# Patient Record
Sex: Female | Born: 1995 | Race: Black or African American | Hispanic: No | Marital: Single | Smoking: Former smoker
Health system: Southern US, Community
[De-identification: ages and names within clinical notes are randomized; demographics above are authoritative.]

## PROBLEM LIST (undated history)

## (undated) ENCOUNTER — Inpatient Hospital Stay: Payer: Self-pay

## (undated) ENCOUNTER — Inpatient Hospital Stay (HOSPITAL_COMMUNITY): Payer: Medicaid Other | Admitting: Obstetrics & Gynecology

## (undated) DIAGNOSIS — F32A Depression, unspecified: Secondary | ICD-10-CM

## (undated) DIAGNOSIS — G4700 Insomnia, unspecified: Secondary | ICD-10-CM

## (undated) DIAGNOSIS — M549 Dorsalgia, unspecified: Secondary | ICD-10-CM

## (undated) DIAGNOSIS — F329 Major depressive disorder, single episode, unspecified: Secondary | ICD-10-CM

## (undated) DIAGNOSIS — J189 Pneumonia, unspecified organism: Secondary | ICD-10-CM

## (undated) DIAGNOSIS — U071 COVID-19: Secondary | ICD-10-CM

## (undated) DIAGNOSIS — Z5189 Encounter for other specified aftercare: Secondary | ICD-10-CM

## (undated) DIAGNOSIS — F419 Anxiety disorder, unspecified: Secondary | ICD-10-CM

## (undated) DIAGNOSIS — F319 Bipolar disorder, unspecified: Secondary | ICD-10-CM

## (undated) DIAGNOSIS — I82409 Acute embolism and thrombosis of unspecified deep veins of unspecified lower extremity: Secondary | ICD-10-CM

## (undated) DIAGNOSIS — F4310 Post-traumatic stress disorder, unspecified: Secondary | ICD-10-CM

## (undated) DIAGNOSIS — I10 Essential (primary) hypertension: Secondary | ICD-10-CM

## (undated) DIAGNOSIS — G8929 Other chronic pain: Secondary | ICD-10-CM

## (undated) DIAGNOSIS — T7840XA Allergy, unspecified, initial encounter: Secondary | ICD-10-CM

## (undated) DIAGNOSIS — N920 Excessive and frequent menstruation with regular cycle: Secondary | ICD-10-CM

## (undated) DIAGNOSIS — J45909 Unspecified asthma, uncomplicated: Secondary | ICD-10-CM

## (undated) DIAGNOSIS — F609 Personality disorder, unspecified: Secondary | ICD-10-CM

## (undated) DIAGNOSIS — D649 Anemia, unspecified: Secondary | ICD-10-CM

## (undated) DIAGNOSIS — Z3491 Encounter for supervision of normal pregnancy, unspecified, first trimester: Secondary | ICD-10-CM

## (undated) DIAGNOSIS — D509 Iron deficiency anemia, unspecified: Secondary | ICD-10-CM

## (undated) DIAGNOSIS — O26893 Other specified pregnancy related conditions, third trimester: Secondary | ICD-10-CM

## (undated) DIAGNOSIS — O200 Threatened abortion: Secondary | ICD-10-CM

## (undated) DIAGNOSIS — R42 Dizziness and giddiness: Secondary | ICD-10-CM

## (undated) HISTORY — DX: Encounter for other specified aftercare: Z51.89

## (undated) HISTORY — PX: OTHER SURGICAL HISTORY: SHX169

## (undated) HISTORY — PX: BUNIONECTOMY: SHX129

## (undated) HISTORY — DX: Dorsalgia, unspecified: M54.9

## (undated) HISTORY — DX: Allergy, unspecified, initial encounter: T78.40XA

## (undated) HISTORY — DX: Other chronic pain: G89.29

---

## 2014-04-27 ENCOUNTER — Emergency Department: Payer: Self-pay | Admitting: Emergency Medicine

## 2014-05-19 ENCOUNTER — Emergency Department: Payer: Self-pay | Admitting: Emergency Medicine

## 2014-08-09 ENCOUNTER — Encounter: Payer: Self-pay | Admitting: Emergency Medicine

## 2014-08-09 ENCOUNTER — Emergency Department
Admission: EM | Admit: 2014-08-09 | Discharge: 2014-08-09 | Disposition: A | Discharge status: Home / Self Care | Payer: Medicaid Other | Attending: Emergency Medicine | Admitting: Emergency Medicine

## 2014-08-09 DIAGNOSIS — Z720 Tobacco use: Secondary | ICD-10-CM | POA: Diagnosis not present

## 2014-08-09 DIAGNOSIS — M25512 Pain in left shoulder: Secondary | ICD-10-CM | POA: Insufficient documentation

## 2014-08-09 DIAGNOSIS — G8929 Other chronic pain: Secondary | ICD-10-CM | POA: Diagnosis not present

## 2014-08-09 DIAGNOSIS — M25511 Pain in right shoulder: Secondary | ICD-10-CM | POA: Diagnosis not present

## 2014-08-09 DIAGNOSIS — Z880 Allergy status to penicillin: Secondary | ICD-10-CM | POA: Diagnosis not present

## 2014-08-09 HISTORY — DX: Anxiety disorder, unspecified: F41.9

## 2014-08-09 HISTORY — DX: Anemia, unspecified: D64.9

## 2014-08-09 HISTORY — DX: Unspecified asthma, uncomplicated: J45.909

## 2014-08-09 MED ORDER — CYCLOBENZAPRINE HCL 10 MG PO TABS: 10.0000 mg | ORAL_TABLET | Freq: Three times a day (TID) | ORAL | Status: DC | PRN

## 2014-08-09 MED ORDER — MELOXICAM 15 MG PO TABS: 15.0000 mg | ORAL_TABLET | Freq: Every day | ORAL | Status: DC

## 2014-08-09 NOTE — ED Notes (Signed)
Pt reports that her shoulders are hurting her. She states that if she pulls them forward and it pops that it helps for a little while then it starts hurting again.

## 2014-08-09 NOTE — ED Provider Notes (Signed)
[facility] Emergency Department Provider Note  ____________________________________________  Time seen: Approximately 11:00 AM  I have reviewed the triage vital signs and the nursing notes.   HISTORY  Chief Complaint Shoulder Pain    HPI Kristy Reid is a 19 y.o. female who complains of chronic bilateral shoulder pain. Pain is worse on the left. She states the pain is getting worse because she has an infant son that she carries. She states prior to pregnancy she took Flexeril and Percocet to manage her pain. She has not had either of those medications for several months. She states that stretching helps with the pain for a short period of time.   Past Medical History  Diagnosis Date  . Asthma   . Anemia   . Anxiety     There are no active problems to display for this patient.   History reviewed. No pertinent past surgical history.  Current Outpatient Rx  Name  Route  Sig  Dispense  Refill  . cyclobenzaprine (FLEXERIL) 10 MG tablet   Oral   Take 1 tablet (10 mg total) by mouth 3 (three) times daily as needed for muscle spasms.   30 tablet   0   . meloxicam (MOBIC) 15 MG tablet   Oral   Take 1 tablet (15 mg total) by mouth daily.   30 tablet   2     Allergies Penicillins and Rocephin  History reviewed. No pertinent family history.  Social History History  Substance Use Topics  . Smoking status: Current Every Day Smoker  . Smokeless tobacco: Not on file  . Alcohol Use: No    Review of Systems Constitutional: No fever/chills Eyes: No visual changes. ENT: No sore throat. Cardiovascular: Denies chest pain. Respiratory: Denies shortness of breath. Musculoskeletal: Bilateral shoulder pain. Skin: Negative for rash. Neurological: Negative for headaches, focal weakness or numbness.  10-point ROS otherwise negative.  ____________________________________________   PHYSICAL EXAM:  VITAL SIGNS: ED Triage Vitals  Enc Vitals  Group     BP 08/09/14 0915 129/74 mmHg     Pulse Rate 08/09/14 0915 88     Resp 08/09/14 0915 18     Temp 08/09/14 0915 98.1 F (36.7 C)     Temp Source 08/09/14 0915 Oral     SpO2 08/09/14 0915 100 %     Weight 08/09/14 0915 134 lb (60.782 kg)     Height 08/09/14 0915 5\' 2"  (1.575 m)     Head Cir --      Peak Flow --      Pain Score --      Pain Loc --      Pain Edu? --      Excl. in GC? --     Constitutional: Alert and oriented. Well appearing and in no acute distress. Eyes: Conjunctivae are normal. PERRL. EOMI. Head: Atraumatic. Nose: No congestion/rhinnorhea. Mouth/Throat: Mucous membranes are moist.  Oropharynx non-erythematous. Neck: No stridor.  No cervical spine tenderness to palpation. Cardiovascular: Normal rate, regular rhythm. Grossly normal heart sounds.  Good peripheral circulation. Respiratory: Normal respiratory effort.  Gastrointestinal: Soft and nontender. No distention.  Musculoskeletal: No lower extremity tenderness nor edema.  No joint effusions. Full range of motion 4. Shoulders atraumatic. No deformity or step-off. Neurologic:  Normal speech and language. No gross focal neurologic deficits are appreciated. Speech is normal. No gait instability. Skin:  Skin is warm, dry and intact. No rash noted. Psychiatric: Mood and affect are normal. Speech and behavior are  normal.  ____________________________________________   LABS (all labs ordered are listed, but only abnormal results are displayed)  Labs Reviewed - No data to display ____________________________________________  EKG   ____________________________________________  RADIOLOGY   ____________________________________________   PROCEDURES  Procedure(s) performed: None  Critical Care performed: No  ____________________________________________   INITIAL IMPRESSION / ASSESSMENT AND PLAN / ED COURSE  Pertinent labs & imaging results that were available during my care of the patient  were reviewed by me and considered in my medical decision making (see chart for details).  Patient was advised to follow-up with her primary care provider. She was advised that the emergency department does not typically write narcotic prescriptions for chronic pain. ____________________________________________   FINAL CLINICAL IMPRESSION(S) / ED DIAGNOSES  Final diagnoses:  Chronic pain of both shoulders      Chinita Pester, FNP 08/09/14 1104  Patient seen by mid-level I was in the ER available for consult during this time  Arnaldo Natal, MD 08/09/14 [phone]

## 2014-08-14 ENCOUNTER — Encounter: Payer: Self-pay | Admitting: Emergency Medicine

## 2014-08-14 ENCOUNTER — Emergency Department
Admission: EM | Admit: 2014-08-14 | Discharge: 2014-08-14 | Disposition: A | Discharge status: Home / Self Care | Payer: Medicaid Other | Attending: Emergency Medicine | Admitting: Emergency Medicine

## 2014-08-14 DIAGNOSIS — Z880 Allergy status to penicillin: Secondary | ICD-10-CM | POA: Diagnosis not present

## 2014-08-14 DIAGNOSIS — Z791 Long term (current) use of non-steroidal anti-inflammatories (NSAID): Secondary | ICD-10-CM | POA: Diagnosis not present

## 2014-08-14 DIAGNOSIS — E860 Dehydration: Secondary | ICD-10-CM | POA: Diagnosis not present

## 2014-08-14 DIAGNOSIS — J069 Acute upper respiratory infection, unspecified: Secondary | ICD-10-CM | POA: Diagnosis not present

## 2014-08-14 DIAGNOSIS — Z3202 Encounter for pregnancy test, result negative: Secondary | ICD-10-CM | POA: Insufficient documentation

## 2014-08-14 DIAGNOSIS — J45909 Unspecified asthma, uncomplicated: Secondary | ICD-10-CM | POA: Insufficient documentation

## 2014-08-14 DIAGNOSIS — Z720 Tobacco use: Secondary | ICD-10-CM | POA: Insufficient documentation

## 2014-08-14 DIAGNOSIS — R05 Cough: Secondary | ICD-10-CM | POA: Diagnosis present

## 2014-08-14 LAB — CBC WITH DIFFERENTIAL/PLATELET
BASOS PCT: 1 %
Basophils Absolute: 0.1 10*3/uL (ref 0–0.1)
Eosinophils Absolute: 0.4 10*3/uL (ref 0–0.7)
Eosinophils Relative: 5 %
HCT: 42.6 % (ref 35.0–47.0)
HEMOGLOBIN: 14 g/dL (ref 12.0–16.0)
Lymphocytes Relative: 43 %
Lymphs Abs: 3.1 10*3/uL (ref 1.0–3.6)
MCH: 29.8 pg (ref 26.0–34.0)
MCHC: 33 g/dL (ref 32.0–36.0)
MCV: 90.2 fL (ref 80.0–100.0)
Monocytes Absolute: 0.5 10*3/uL (ref 0.2–0.9)
Monocytes Relative: 8 %
NEUTROS ABS: 3.2 10*3/uL (ref 1.4–6.5)
NEUTROS PCT: 43 %
Platelets: 253 10*3/uL (ref 150–440)
RBC: 4.72 MIL/uL (ref 3.80–5.20)
RDW: 14 % (ref 11.5–14.5)
WBC: 7.3 10*3/uL (ref 3.6–11.0)

## 2014-08-14 LAB — COMPREHENSIVE METABOLIC PANEL
ALBUMIN: 4 g/dL (ref 3.5–5.0)
ALT: 34 U/L (ref 14–54)
AST: 24 U/L (ref 15–41)
Alkaline Phosphatase: 106 U/L (ref 38–126)
Anion gap: 7 (ref 5–15)
BILIRUBIN TOTAL: 0.2 mg/dL — AB (ref 0.3–1.2)
BUN: 9 mg/dL (ref 6–20)
CALCIUM: 9.8 mg/dL (ref 8.9–10.3)
CO2: 26 mmol/L (ref 22–32)
CREATININE: 0.76 mg/dL (ref 0.44–1.00)
Chloride: 107 mmol/L (ref 101–111)
GFR calc Af Amer: >60 mL/min (ref >60)
GFR calc non Af Amer: >60 mL/min (ref >60)
Glucose, Bld: 94 mg/dL (ref 65–99)
Potassium: 4.5 mmol/L (ref 3.5–5.1)
Sodium: 140 mmol/L (ref 135–145)
Total Protein: 7.8 g/dL (ref 6.5–8.1)

## 2014-08-14 LAB — URINALYSIS COMPLETE WITH MICROSCOPIC (ARMC ONLY)
BILIRUBIN URINE: NEGATIVE
Bacteria, UA: NONE SEEN
Glucose, UA: NEGATIVE mg/dL
Hgb urine dipstick: NEGATIVE
Ketones, ur: NEGATIVE mg/dL
NITRITE: NEGATIVE
Protein, ur: NEGATIVE mg/dL
Specific Gravity, Urine: 1.02 (ref 1.005–1.030)
pH: 6 (ref 5.0–8.0)

## 2014-08-14 LAB — GLUCOSE, CAPILLARY: Glucose-Capillary: 84 mg/dL (ref 65–99)

## 2014-08-14 LAB — POCT PREGNANCY, URINE: Preg Test, Ur: NEGATIVE

## 2014-08-14 MED ORDER — PSEUDOEPH-BROMPHEN-DM 30-2-10 MG/5ML PO SYRP: 5.0000 mL | ORAL_SOLUTION | Freq: Four times a day (QID) | ORAL | Status: DC | PRN

## 2014-08-14 MED ORDER — SODIUM CHLORIDE 0.9 % IV BOLUS (SEPSIS)
1000.0000 mL | Freq: Once | INTRAVENOUS | Status: AC
  Administered 2014-08-14: 1000 mL via INTRAVENOUS

## 2014-08-14 NOTE — ED Notes (Signed)
Pt presents with dry cough for two days.

## 2014-08-14 NOTE — Discharge Instructions (Signed)
Cough, Adult  A cough is a reflex. It helps you clear your throat and airways. A cough can help heal your body. A cough can last 2 or 3 weeks (acute) or may last more than 8 weeks (chronic). Some common causes of a cough can include an infection, allergy, or a cold. HOME CARE  Only take medicine as told by your doctor.  If given, take your medicines (antibiotics) as told. Finish them even if you start to feel better.  Use a cold steam vaporizer or humidifier in your home. This can help loosen thick spit (secretions).  Sleep so you are almost sitting up (semi-upright). Use pillows to do this. This helps reduce coughing.  Rest as needed.  Stop smoking if you smoke. GET HELP RIGHT AWAY IF:  You have yellowish-white fluid (pus) in your thick spit.  Your cough gets worse.  Your medicine does not reduce coughing, and you are losing sleep.  You cough up blood.  You have trouble breathing.  Your pain gets worse and medicine does not help.  You have a fever. MAKE SURE YOU:   Understand these instructions.  Will watch your condition.  Will get help right away if you are not doing well or get worse. Document Released: 11/13/2010 Document Revised: 07/17/2013 Document Reviewed: 11/13/2010 [facility] Patient Information 2015 [place], [place]. This information is not intended to replace advice given to you by your health care provider. Make sure you discuss any questions you have with your health care provider.  Dehydration, Adult Dehydration means your body does not have as much fluid as it needs. Your kidneys, brain, and heart will not work properly without the right amount of fluids and salt.  HOME CARE  Ask your doctor how to replace body fluid losses (rehydrate).  Drink enough fluids to keep your pee (urine) clear or pale yellow.  Drink small amounts of fluids often if you feel sick to your stomach (nauseous) or throw up (vomit).  Eat like you normally do.  Avoid:  Foods or  drinks high in sugar.  Bubbly (carbonated) drinks.  Juice.  Very hot or cold fluids.  Drinks with caffeine.  Fatty, greasy foods.  Alcohol.  Tobacco.  Eating too much.  Gelatin desserts.  Wash your hands to avoid spreading germs (bacteria, viruses).  Only take medicine as told by your doctor.  Keep all doctor visits as told. GET HELP RIGHT AWAY IF:   You cannot drink something without throwing up.  You get worse even with treatment.  Your vomit has blood in it or looks greenish.  Your poop (stool) has blood in it or looks black and tarry.  You have not peed in 6 to 8 hours.  You pee a small amount of very dark pee.  You have a fever.  You pass out (faint).  You have belly (abdominal) pain that gets worse or stays in one spot (localizes).  You have a rash, stiff neck, or bad headache.  You get easily annoyed, sleepy, or are hard to wake up.  You feel weak, dizzy, or very thirsty. MAKE SURE YOU:   Understand these instructions.  Will watch your condition.  Will get help right away if you are not doing well or get worse. Document Released: 12/27/2008 Document Revised: 05/25/2011 Document Reviewed: 10/20/2010 [facility] Patient Information 2015 [place], [place]. This information is not intended to replace advice given to you by your health care provider. Make sure you discuss any questions you have with your health care  provider.

## 2014-08-14 NOTE — ED Provider Notes (Signed)
[facility] Emergency Department Provider Note  ____________________________________________  Time seen: Approximately 9:46 AM  I have reviewed the triage vital signs and the nursing notes.   HISTORY  Chief Complaint Cough    HPI Kristy Reid is a 19 y.o. female patient states 2 days of dry cough. Patient states she is also feeling she can get enough fluids.She states she felt like her heart is racing.Denies any fever or nausea vomiting diarrhea. Patient states she normal had trouble with bowel movements but they're flowing freely at this time. Patient denies any urinary complaints. Patient denies any pain.  Past Medical History  Diagnosis Date  . Asthma   . Anemia   . Anxiety   . Anemia     There are no active problems to display for this patient.   Past Surgical History  Procedure Laterality Date  . Wrist sugery      Current Outpatient Rx  Name  Route  Sig  Dispense  Refill  . cyclobenzaprine (FLEXERIL) 10 MG tablet   Oral   Take 1 tablet (10 mg total) by mouth 3 (three) times daily as needed for muscle spasms.   30 tablet   0   . meloxicam (MOBIC) 15 MG tablet   Oral   Take 1 tablet (15 mg total) by mouth daily.   30 tablet   2     Allergies Penicillins and Rocephin  No family history on file.  Social History History  Substance Use Topics  . Smoking status: Current Every Day Smoker  . Smokeless tobacco: Not on file  . Alcohol Use: No    Review of Systems Constitutional: No fever/chills Eyes: No visual changes. ENT: No sore throat. Cardiovascular: Denies chest pain. Respiratory: Denies shortness of breath. Gastrointestinal: No abdominal pain.  No nausea, no vomiting.  No diarrhea.  No constipation. Genitourinary: Negative for dysuria. Musculoskeletal: Negative for back pain. Skin: Negative for rash. Neurological: Negative for headaches, focal weakness or numbness. 10-point ROS otherwise  negative.  ____________________________________________   PHYSICAL EXAM:  VITAL SIGNS: ED Triage Vitals  Enc Vitals Group     BP 08/14/14 0832 131/59 mmHg     Pulse Rate 08/14/14 0832 113     Resp 08/14/14 0832 18     Temp 08/14/14 0832 97.9 F (36.6 C)     Temp Source 08/14/14 0832 Oral     SpO2 08/14/14 0832 96 %     Weight 08/14/14 0832 134 lb (60.782 kg)     Height 08/14/14 0832  (1.575 m)     Head Cir --      Peak Flow --      Pain Score --      Pain Loc --      Pain Edu? --      Excl. in GC? --     Constitutional: Alert and oriented. Well appearing and in no acute distress. Eyes: Conjunctivae are normal. PERRL. EOMI. Head: Atraumatic. Nose: No congestion/rhinnorhea. Mouth/Throat: Mucous membranes are moist.  Oropharynx non-erythematous. Neck: No stridor.  No deformity for nuchal range of motion nontender palpation. Hematological/Lymphatic/Immunilogical: No cervical lymphadenopathy. Cardiovascular: Pulse 116., regular rhythm. Grossly normal heart sounds.  Good peripheral circulation. Respiratory: Normal respiratory effort.  No retractions. Lungs CTAB. Gastrointestinal: Soft and nontender. No distention. No abdominal bruits. No CVA tenderness. Musculoskeletal: No lower extremity tenderness nor edema.  No joint effusions. Neurologic:  Normal speech and language. No gross focal neurologic deficits are appreciated. Speech is normal. No gait  instability. Skin:  Skin is warm, dry and intact. No rash noted. Psychiatric: Mood and affect are normal. Speech and behavior are normal.  ____________________________________________   LABS (all labs ordered are listed, but only abnormal results are displayed)  Labs Reviewed  COMPREHENSIVE METABOLIC PANEL - Abnormal; Notable for the following:    Total Bilirubin 0.2 (*)    All other components within normal limits  URINALYSIS COMPLETEWITH MICROSCOPIC ([facility] ONLY) - Abnormal; Notable for the following:    Color, Urine  YELLOW (*)    APPearance CLEAR (*)    Leukocytes, UA 1+ (*)    Squamous Epithelial / LPF 6-30 (*)    All other components within normal limits  GLUCOSE, CAPILLARY  CBC WITH DIFFERENTIAL/PLATELET  POCT PREGNANCY, URINE   ____________________________________________  EKG   ____________________________________________  RADIOLOGY   ____________________________________________   PROCEDURES  Procedure(s) performed: None  Critical Care performed: No  ____________________________________________   INITIAL IMPRESSION / ASSESSMENT AND PLAN / ED COURSE  Pertinent labs & imaging results that were available during my care of the patient were reviewed by me and considered in my medical decision making (see chart for details).  Upper respiratory infection ____________________________________________   FINAL CLINICAL IMPRESSION(S) / ED DIAGNOSES  Final diagnoses:  Dehydration, mild  URI (upper respiratory infection)       Joni Reiningonald K Smith, PA-C 08/14/14 1106  I was available for consult to intended patient is in the ER  Arnaldo NatalPaul F Malinda, MD 08/14/14 [phone]

## 2014-08-26 ENCOUNTER — Emergency Department: Payer: Medicaid Other

## 2014-08-26 ENCOUNTER — Emergency Department
Admission: EM | Admit: 2014-08-26 | Discharge: 2014-08-26 | Disposition: A | Discharge status: Home / Self Care | Payer: Medicaid Other | Attending: Emergency Medicine | Admitting: Emergency Medicine

## 2014-08-26 ENCOUNTER — Encounter: Payer: Self-pay | Admitting: General Practice

## 2014-08-26 DIAGNOSIS — J018 Other acute sinusitis: Secondary | ICD-10-CM | POA: Diagnosis not present

## 2014-08-26 DIAGNOSIS — Z792 Long term (current) use of antibiotics: Secondary | ICD-10-CM | POA: Diagnosis not present

## 2014-08-26 DIAGNOSIS — Z880 Allergy status to penicillin: Secondary | ICD-10-CM | POA: Diagnosis not present

## 2014-08-26 DIAGNOSIS — Z79899 Other long term (current) drug therapy: Secondary | ICD-10-CM | POA: Diagnosis not present

## 2014-08-26 DIAGNOSIS — Z87891 Personal history of nicotine dependence: Secondary | ICD-10-CM | POA: Diagnosis not present

## 2014-08-26 DIAGNOSIS — J0110 Acute frontal sinusitis, unspecified: Secondary | ICD-10-CM

## 2014-08-26 DIAGNOSIS — J0120 Acute ethmoidal sinusitis, unspecified: Secondary | ICD-10-CM

## 2014-08-26 DIAGNOSIS — Z791 Long term (current) use of non-steroidal anti-inflammatories (NSAID): Secondary | ICD-10-CM | POA: Diagnosis not present

## 2014-08-26 DIAGNOSIS — R51 Headache: Secondary | ICD-10-CM | POA: Diagnosis present

## 2014-08-26 MED ORDER — BUTALBITAL-APAP-CAFFEINE 50-325-40 MG PO TABS
2.0000 | ORAL_TABLET | Freq: Once | ORAL | Status: AC
  Administered 2014-08-26: 2 via ORAL

## 2014-08-26 MED ORDER — BUTALBITAL-APAP-CAFFEINE 50-325-40 MG PO TABS
ORAL_TABLET | ORAL | Status: AC
  Administered 2014-08-26: 2 via ORAL
  Filled 2014-08-26: qty 2

## 2014-08-26 MED ORDER — KETOROLAC TROMETHAMINE 60 MG/2ML IM SOLN
INTRAMUSCULAR | Status: DC
Start: 2014-08-26 — End: 2014-08-27
  Filled 2014-08-26: qty 2

## 2014-08-26 MED ORDER — KETOROLAC TROMETHAMINE 10 MG PO TABS: 10.0000 mg | ORAL_TABLET | Freq: Four times a day (QID) | ORAL | Status: DC | PRN

## 2014-08-26 MED ORDER — SULFAMETHOXAZOLE-TRIMETHOPRIM 800-160 MG PO TABS
ORAL_TABLET | ORAL | Status: AC
  Filled 2014-08-26: qty 1

## 2014-08-26 MED ORDER — SULFAMETHOXAZOLE-TRIMETHOPRIM 800-160 MG PO TABS
1.0000 | ORAL_TABLET | Freq: Once | ORAL | Status: AC
  Administered 2014-08-26: 1 via ORAL

## 2014-08-26 MED ORDER — KETOROLAC TROMETHAMINE 60 MG/2ML IM SOLN
60.0000 mg | Freq: Once | INTRAMUSCULAR | Status: AC
  Administered 2014-08-26: 60 mg via INTRAMUSCULAR

## 2014-08-26 MED ORDER — SULFAMETHOXAZOLE-TRIMETHOPRIM 800-160 MG PO TABS: 1.0000 | ORAL_TABLET | Freq: Two times a day (BID) | ORAL | Status: DC

## 2014-08-26 NOTE — ED Notes (Signed)
Pt. Arrived to ed from home with reports of experiencing a headache x four days. Pt denies hx of migraines. Pt states "i hurts here and here" Pt points to frontal part of head between eyes and reports that pain is also behind both ears. Pt reports on improvement with OTC. Pt alert and oriented. Pt reports experiencing runny nose and cough due to seasonal allergies.

## 2014-08-26 NOTE — Discharge Instructions (Signed)
Sinusitis °Sinusitis is redness, soreness, and puffiness (inflammation) of the air pockets in the bones of your face (sinuses). The redness, soreness, and puffiness can cause air and mucus to get trapped in your sinuses. This can allow germs to grow and cause an infection.  °HOME CARE  °· Drink enough fluids to keep your pee (urine) clear or pale yellow. °· Use a humidifier in your home. °· Run a hot shower to create steam in the bathroom. Sit in the bathroom with the door closed. Breathe in the steam 3-4 times a day. °· Put a warm, moist washcloth on your face 3-4 times a day, or as told by your doctor. °· Use salt water sprays (saline sprays) to wet the thick fluid in your nose. This can help the sinuses drain. °· Only take medicine as told by your doctor. °GET HELP RIGHT AWAY IF:  °· Your pain gets worse. °· You have very bad headaches. °· You are sick to your stomach (nauseous). °· You throw up (vomit). °· You are very sleepy (drowsy) all the time. °· Your face is puffy (swollen). °· Your vision changes. °· You have a stiff neck. °· You have trouble breathing. °MAKE SURE YOU:  °· Understand these instructions. °· Will watch your condition. °· Will get help right away if you are not doing well or get worse. °Document Released: 08/19/2007 Document Revised: 11/25/2011 Document Reviewed: 10/06/2011 °ExitCare® Patient Information ©2015 [employer], LLC. This information is not intended to replace advice given to you by your health care provider. Make sure you discuss any questions you have with your health care provider. ° °

## 2014-08-26 NOTE — ED Provider Notes (Signed)
[facility] Emergency Department Provider Note  ____________________________________________  Time seen: Approximately 7:01 PM I have reviewed the triage vital signs and the nursing notes.   HISTORY  Chief Complaint Migraine    HPI Kristy Reid is a 19 y.o. female presents to the emergency department with a four-day history of headache. Pain is 10 out of 10. She denies having frequent headaches in the past. She denies a history of migraine. She was recently seen for an upper respiratory infection.She states that she's taken Tylenol and ibuprofen without any relief. She denies nausea or vomiting. She does complain of photophobia. She also complains of mild dizziness with position change.   Past Medical History  Diagnosis Date  . Asthma   . Anemia   . Anxiety   . Anemia     There are no active problems to display for this patient.   Past Surgical History  Procedure Laterality Date  . Wrist sugery      Current Outpatient Rx  Name  Route  Sig  Dispense  Refill  . brompheniramine-pseudoephedrine-DM 30-2-10 MG/5ML syrup   Oral   Take 5 mLs by mouth 4 (four) times daily as needed.   120 mL   0   . cyclobenzaprine (FLEXERIL) 10 MG tablet   Oral   Take 1 tablet (10 mg total) by mouth 3 (three) times daily as needed for muscle spasms.   30 tablet   0   . ketorolac (TORADOL) 10 MG tablet   Oral   Take 1 tablet (10 mg total) by mouth every 6 (six) hours as needed.   20 tablet   0   . meloxicam (MOBIC) 15 MG tablet   Oral   Take 1 tablet (15 mg total) by mouth daily.   30 tablet   2   . sulfamethoxazole-trimethoprim (BACTRIM DS,SEPTRA DS) 800-160 MG per tablet   Oral   Take 1 tablet by mouth 2 (two) times daily.   20 tablet   0     Allergies Penicillins and Rocephin  No family history on file.  Social History History  Substance Use Topics  . Smoking status: Former Games developer  . Smokeless tobacco: Never Used  . Alcohol Use: No     Review of Systems Constitutional: No fever/chills Eyes: No visual changes. ENT: No sore throat. No nasal congestion. No rhinorrhea. Cardiovascular: Denies chest pain. Respiratory: Denies shortness of breath. Gastrointestinal: No abdominal pain.  No nausea, no vomiting.  No diarrhea.  No constipation. Genitourinary: Negative for dysuria. Musculoskeletal: Negative for back pain. Skin: Negative for rash. Neurological: Positive for headaches, negative for focal weakness or numbness.  10-point ROS otherwise negative.  ____________________________________________   PHYSICAL EXAM:  VITAL SIGNS: ED Triage Vitals  Enc Vitals Group     BP 08/26/14 1837 120/75 mmHg     Pulse Rate 08/26/14 1837 106     Resp 08/26/14 1837 18     Temp 08/26/14 1837 99.8 F (37.7 C)     Temp Source 08/26/14 1837 Oral     SpO2 08/26/14 1837 98 %     Weight 08/26/14 1837 132 lb (59.875 kg)     Height 08/26/14 1837  (1.575 m)     Head Cir --      Peak Flow --      Pain Score 08/26/14 1838 9     Pain Loc --      Pain Edu? --      Excl. in GC? --  Constitutional: Alert and oriented. Well appearing and in no acute distress. Eyes: Conjunctivae are normal. PERRL. EOMI. pupils 3 and equal Head: Atraumatic. Nose: No congestion/rhinnorhea. Mouth/Throat: Mucous membranes are moist.  Oropharynx non-erythematous. Neck: No stridor.   Cardiovascular: Normal rate, regular rhythm. Grossly normal heart sounds.  Good peripheral circulation. Respiratory: Normal respiratory effort.  No retractions. Lungs CTAB. Gastrointestinal: Soft and nontender. No distention. No abdominal bruits. No CVA tenderness. Musculoskeletal: No lower extremity tenderness nor edema.  No joint effusions. Neurologic:  Normal speech and language. No gross focal neurologic deficits are appreciated. Speech is normal. No gait instability. Neuro exam completely normal. Skin:  Skin is warm, dry and intact. No rash noted. Psychiatric: Mood  and affect are normal. Speech and behavior are normal.  ____________________________________________   LABS (all labs ordered are listed, but only abnormal results are displayed)  Labs Reviewed - No data to display ____________________________________________  EKG   ____________________________________________  RADIOLOGY  CT head without contrast shows frontal and ethmoid sinusitis. ____________________________________________   PROCEDURES  Procedure(s) performed: None  Critical Care performed: No  ____________________________________________   INITIAL IMPRESSION / ASSESSMENT AND PLAN / ED COURSE  Pertinent labs & imaging results that were available during my care of the patient were reviewed by me and considered in my medical decision making (see chart for details).  Patient was started on antibiotics. She is to follow-up with ENT for symptoms that are not improving over the next 48 hours. She was advised to return to emergency department for symptoms that change or worsen if she is unable schedule an appointment. Pain had decreased to 5 out of 10 upon disposition. ____________________________________________   FINAL CLINICAL IMPRESSION(S) / ED DIAGNOSES  Final diagnoses:  Acute frontal sinusitis, recurrence not specified  Acute ethmoidal sinusitis, recurrence not specified      Chinita Pester, FNP 08/26/14 1657  Sharyn Creamer, MD 08/27/14 [phone]

## 2014-10-16 ENCOUNTER — Emergency Department
Admission: EM | Admit: 2014-10-16 | Discharge: 2014-10-16 | Disposition: A | Discharge status: Home / Self Care | Payer: Medicaid Other | Attending: Emergency Medicine | Admitting: Emergency Medicine

## 2014-10-16 DIAGNOSIS — Z87891 Personal history of nicotine dependence: Secondary | ICD-10-CM | POA: Diagnosis not present

## 2014-10-16 DIAGNOSIS — R5383 Other fatigue: Secondary | ICD-10-CM | POA: Diagnosis not present

## 2014-10-16 DIAGNOSIS — Z862 Personal history of diseases of the blood and blood-forming organs and certain disorders involving the immune mechanism: Secondary | ICD-10-CM | POA: Diagnosis not present

## 2014-10-16 DIAGNOSIS — Z139 Encounter for screening, unspecified: Secondary | ICD-10-CM

## 2014-10-16 DIAGNOSIS — Z880 Allergy status to penicillin: Secondary | ICD-10-CM | POA: Diagnosis not present

## 2014-10-16 DIAGNOSIS — Z3202 Encounter for pregnancy test, result negative: Secondary | ICD-10-CM | POA: Insufficient documentation

## 2014-10-16 DIAGNOSIS — Z791 Long term (current) use of non-steroidal anti-inflammatories (NSAID): Secondary | ICD-10-CM | POA: Insufficient documentation

## 2014-10-16 DIAGNOSIS — Z0000 Encounter for general adult medical examination without abnormal findings: Secondary | ICD-10-CM | POA: Diagnosis present

## 2014-10-16 LAB — CBC
HCT: 41.1 % (ref 35.0–47.0)
Hemoglobin: 13.6 g/dL (ref 12.0–16.0)
MCH: 29.2 pg (ref 26.0–34.0)
MCHC: 33 g/dL (ref 32.0–36.0)
MCV: 88.4 fL (ref 80.0–100.0)
Platelets: 234 10*3/uL (ref 150–440)
RBC: 4.65 MIL/uL (ref 3.80–5.20)
RDW: 14.5 % (ref 11.5–14.5)
WBC: 5.6 10*3/uL (ref 3.6–11.0)

## 2014-10-16 LAB — URINALYSIS COMPLETE WITH MICROSCOPIC (ARMC ONLY)
Bilirubin Urine: NEGATIVE
Glucose, UA: NEGATIVE mg/dL
HGB URINE DIPSTICK: NEGATIVE
KETONES UR: NEGATIVE mg/dL
LEUKOCYTES UA: NEGATIVE
Nitrite: NEGATIVE
PROTEIN: NEGATIVE mg/dL
RBC / HPF: NONE SEEN RBC/hpf (ref 0–5)
SPECIFIC GRAVITY, URINE: 1.026 (ref 1.005–1.030)
pH: 6 (ref 5.0–8.0)

## 2014-10-16 LAB — BASIC METABOLIC PANEL
Anion gap: 7 (ref 5–15)
BUN: 9 mg/dL (ref 6–20)
CO2: 26 mmol/L (ref 22–32)
Calcium: 9.2 mg/dL (ref 8.9–10.3)
Chloride: 108 mmol/L (ref 101–111)
Creatinine, Ser: 0.84 mg/dL (ref 0.44–1.00)
Glucose, Bld: 86 mg/dL (ref 65–99)
Potassium: 3.6 mmol/L (ref 3.5–5.1)
Sodium: 141 mmol/L (ref 135–145)

## 2014-10-16 LAB — POCT PREGNANCY, URINE: Preg Test, Ur: NEGATIVE

## 2014-10-16 NOTE — Discharge Instructions (Signed)

## 2014-10-16 NOTE — ED Notes (Signed)
Pt states "I have a long history of anemia and I have had increased fatigue, joint pain" for the past month with a hx of blood transfusion and iron infusions

## 2014-10-16 NOTE — ED Notes (Signed)
Patient was at [facility] today and got her finger pricked. Was told her levels were low and so she came here. States she has a history of anemia and has had multiple transfusions of blood and iron.

## 2014-10-16 NOTE — ED Provider Notes (Signed)
[facility] Emergency Department Provider Note  ____________________________________________  Time seen: On arrival  I have reviewed the triage vital signs and the nursing notes.   HISTORY  Chief Complaint Anemia and Fatigue    HPI ARNETTE DRIGGS is a 19 y.o. female who presents for concerns of anemia. She reports she has a history of anemia and has needed transfusions in the past and she wants to have her hemoglobin checked because she has had some mild fatigue over the last few days. No dizziness no lightheadedness.    Past Medical History  Diagnosis Date  . Asthma   . Anemia   . Anxiety   . Anemia     There are no active problems to display for this patient.   Past Surgical History  Procedure Laterality Date  . Wrist sugery    . Bunionectomy Bilateral     feet    Current Outpatient Rx  Name  Route  Sig  Dispense  Refill  . brompheniramine-pseudoephedrine-DM 30-2-10 MG/5ML syrup   Oral   Take 5 mLs by mouth 4 (four) times daily as needed.   120 mL   0   . cyclobenzaprine (FLEXERIL) 10 MG tablet   Oral   Take 1 tablet (10 mg total) by mouth 3 (three) times daily as needed for muscle spasms.   30 tablet   0   . ketorolac (TORADOL) 10 MG tablet   Oral   Take 1 tablet (10 mg total) by mouth every 6 (six) hours as needed.   20 tablet   0   . meloxicam (MOBIC) 15 MG tablet   Oral   Take 1 tablet (15 mg total) by mouth daily.   30 tablet   2   . sulfamethoxazole-trimethoprim (BACTRIM DS,SEPTRA DS) 800-160 MG per tablet   Oral   Take 1 tablet by mouth 2 (two) times daily.   20 tablet   0     Allergies Penicillins and Rocephin  No family history on file.  Social History History  Substance Use Topics  . Smoking status: Former Games developer  . Smokeless tobacco: Never Used  . Alcohol Use: No    Review of Systems  Constitutional: Negative for fever. Eyes: Negative for visual changes. ENT: Negative for sore  throat Genitourinary: Negative for dysuria. Musculoskeletal: Negative for back pain. Skin: Negative for rash. Neurological: Negative for headaches or focal weakness   ____________________________________________   PHYSICAL EXAM:  VITAL SIGNS: ED Triage Vitals  Enc Vitals Group     BP 10/16/14 1148 116/72 mmHg     Pulse Rate 10/16/14 1148 117     Resp 10/16/14 1148 18     Temp 10/16/14 1148 98 F (36.7 C)     Temp Source 10/16/14 1148 Oral     SpO2 10/16/14 1148 99 %     Weight 10/16/14 1148 130 lb (58.968 kg)     Height 10/16/14 1148  (1.575 m)     Head Cir --      Peak Flow --      Pain Score 10/16/14 1149 9     Pain Loc --      Pain Edu? --      Excl. in GC? --      Constitutional: Alert and oriented. Well appearing and in no distress. Eyes: Conjunctivae are normal.  ENT   Head: Normocephalic and atraumatic.   Mouth/Throat: Mucous membranes are moist. Cardiovascular: Normal rate, regular rhythm. Heart rate 88 on my  exam Respiratory: Normal respiratory effort without tachypnea nor retractions.  Gastrointestinal: Soft and non-tender in all quadrants. No distention. There is no CVA tenderness. Musculoskeletal: Nontender with normal range of motion in all extremities. Neurologic:  Normal speech and language. No gross focal neurologic deficits are appreciated. Skin:  Skin is warm, dry and intact. No rash noted. Psychiatric: Mood and affect are normal. Patient exhibits appropriate insight and judgment.  ____________________________________________    LABS (pertinent positives/negatives)  Labs Reviewed  URINALYSIS COMPLETEWITH MICROSCOPIC ([facility] ONLY) - Abnormal; Notable for the following:    Color, Urine YELLOW (*)    APPearance HAZY (*)    Bacteria, UA RARE (*)    Squamous Epithelial / LPF 6-30 (*)    All other components within normal limits  BASIC METABOLIC PANEL  CBC  POC URINE PREG, ED  POCT PREGNANCY, URINE     ____________________________________________     ____________________________________________    RADIOLOGY I have personally reviewed any xrays that were ordered on this patient: None  ____________________________________________   PROCEDURES  Procedure(s) performed: none   ____________________________________________   INITIAL IMPRESSION / ASSESSMENT AND PLAN / ED COURSE  Pertinent labs & imaging results that were available during my care of the patient were reviewed by me and considered in my medical decision making (see chart for details).  Patient's hemoglobin and labs are stable. Her heart rate is normal her blood pressures normal. She is okay for discharge follow-up with her PCP as needed ____________________________________________   FINAL CLINICAL IMPRESSION(S) / ED DIAGNOSES  Final diagnoses:  Other fatigue  Encounter for medical screening examination     Jene Every, MD 10/16/14 1644

## 2015-01-02 ENCOUNTER — Emergency Department
Admission: EM | Admit: 2015-01-02 | Discharge: 2015-01-02 | Disposition: A | Discharge status: Home / Self Care | Payer: Medicaid Other | Attending: Emergency Medicine | Admitting: Emergency Medicine

## 2015-01-02 ENCOUNTER — Encounter: Payer: Self-pay | Admitting: Emergency Medicine

## 2015-01-02 DIAGNOSIS — Z792 Long term (current) use of antibiotics: Secondary | ICD-10-CM | POA: Insufficient documentation

## 2015-01-02 DIAGNOSIS — Z3202 Encounter for pregnancy test, result negative: Secondary | ICD-10-CM | POA: Insufficient documentation

## 2015-01-02 DIAGNOSIS — Z791 Long term (current) use of non-steroidal anti-inflammatories (NSAID): Secondary | ICD-10-CM | POA: Diagnosis not present

## 2015-01-02 DIAGNOSIS — Y998 Other external cause status: Secondary | ICD-10-CM | POA: Diagnosis not present

## 2015-01-02 DIAGNOSIS — N939 Abnormal uterine and vaginal bleeding, unspecified: Secondary | ICD-10-CM | POA: Diagnosis not present

## 2015-01-02 DIAGNOSIS — Z87891 Personal history of nicotine dependence: Secondary | ICD-10-CM | POA: Diagnosis not present

## 2015-01-02 DIAGNOSIS — Y9389 Activity, other specified: Secondary | ICD-10-CM | POA: Diagnosis not present

## 2015-01-02 DIAGNOSIS — X58XXXA Exposure to other specified factors, initial encounter: Secondary | ICD-10-CM | POA: Diagnosis not present

## 2015-01-02 DIAGNOSIS — T148XXA Other injury of unspecified body region, initial encounter: Secondary | ICD-10-CM

## 2015-01-02 DIAGNOSIS — Y9289 Other specified places as the place of occurrence of the external cause: Secondary | ICD-10-CM | POA: Diagnosis not present

## 2015-01-02 DIAGNOSIS — S4991XA Unspecified injury of right shoulder and upper arm, initial encounter: Secondary | ICD-10-CM | POA: Diagnosis present

## 2015-01-02 DIAGNOSIS — S46911A Strain of unspecified muscle, fascia and tendon at shoulder and upper arm level, right arm, initial encounter: Secondary | ICD-10-CM | POA: Diagnosis not present

## 2015-01-02 LAB — CBC
HCT: 38.6 % (ref 35.0–47.0)
Hemoglobin: 12.9 g/dL (ref 12.0–16.0)
MCH: 28.4 pg (ref 26.0–34.0)
MCHC: 33.3 g/dL (ref 32.0–36.0)
MCV: 85.4 fL (ref 80.0–100.0)
Platelets: 293 10*3/uL (ref 150–440)
RBC: 4.52 MIL/uL (ref 3.80–5.20)
RDW: 13.9 % (ref 11.5–14.5)
WBC: 7.6 10*3/uL (ref 3.6–11.0)

## 2015-01-02 LAB — ABO/RH: ABO/RH(D): O POS

## 2015-01-02 LAB — HCG, QUANTITATIVE, PREGNANCY: hCG, Beta Chain, Quant, S: <1 m[IU]/mL (ref <5)

## 2015-01-02 LAB — POCT PREGNANCY, URINE: PREG TEST UR: NEGATIVE

## 2015-01-02 NOTE — ED Provider Notes (Signed)
[facility] Emergency Department Provider Note  ____________________________________________  Time seen: On arrival  I have reviewed the triage vital signs and the nursing notes.   HISTORY  Chief Complaint Shoulder Pain and Abdominal Cramping    HPI Kristy Reid is a 19 y.o. female who presents with mild vaginal spotting. She reports she did proceed test last week that was positive and today she noticed spotting after urination. She reports she recently stopped breast-feeding approximately 2 months ago for her 20-year-old. She denies pelvic pain, no fever, no dysuria. Separately she complains of right-sided shoulder discomfort which she attributes to repetitive motion at work, this pain is aching in nature and worse with movement.     Past Medical History  Diagnosis Date  . Asthma   . Anemia   . Anxiety   . Anemia     There are no active problems to display for this patient.   Past Surgical History  Procedure Laterality Date  . Wrist sugery    . Bunionectomy Bilateral     feet    Current Outpatient Rx  Name  Route  Sig  Dispense  Refill  . brompheniramine-pseudoephedrine-DM 30-2-10 MG/5ML syrup   Oral   Take 5 mLs by mouth 4 (four) times daily as needed.   120 mL   0   . cyclobenzaprine (FLEXERIL) 10 MG tablet   Oral   Take 1 tablet (10 mg total) by mouth 3 (three) times daily as needed for muscle spasms.   30 tablet   0   . ketorolac (TORADOL) 10 MG tablet   Oral   Take 1 tablet (10 mg total) by mouth every 6 (six) hours as needed.   20 tablet   0   . meloxicam (MOBIC) 15 MG tablet   Oral   Take 1 tablet (15 mg total) by mouth daily.   30 tablet   2   . sulfamethoxazole-trimethoprim (BACTRIM DS,SEPTRA DS) 800-160 MG per tablet   Oral   Take 1 tablet by mouth 2 (two) times daily.   20 tablet   0     Allergies Penicillins and Rocephin  History reviewed. No pertinent family history.  Social History Social History   Substance Use Topics  . Smoking status: Former Games developermoker  . Smokeless tobacco: Never Used  . Alcohol Use: No    Review of Systems  Constitutional: Negative for fever. Eyes: Negative for visual changes. ENT: Negative for sore throat Cardiovascular: Negative for chest pain. Respiratory: Negative for shortness of breath. Gastrointestinal: Negative for abdominal pain, vomiting and diarrhea. Genitourinary: Negative for dysuria. Positive for spotting Musculoskeletal: Negative for back pain. Positive for right shoulder pain Skin: Negative for rash. Neurological: Negative for headaches or focal weakness Psychiatric: No anxiety    ____________________________________________   PHYSICAL EXAM:  VITAL SIGNS: ED Triage Vitals  Enc Vitals Group     BP 01/02/15 1307 110/62 mmHg     Pulse Rate 01/02/15 1307 104     Resp 01/02/15 1307 18     Temp 01/02/15 1307 98.1 F (36.7 C)     Temp Source 01/02/15 1307 Oral     SpO2 01/02/15 1307 98 %     Weight 01/02/15 1307 132 lb (59.875 kg)     Height 01/02/15 1307 5\' 2"  (1.575 m)     Head Cir --      Peak Flow --      Pain Score 01/02/15 1312 7     Pain Loc --  Pain Edu? --      Excl. in GC? --      Constitutional: Alert and oriented. Well appearing and in no distress. Eyes: Conjunctivae are normal.  ENT   Head: Normocephalic and atraumatic.   Mouth/Throat: Mucous membranes are moist. Cardiovascular: Normal rate, regular rhythm. Normal and symmetric distal pulses are present in all extremities. No murmurs, rubs, or gallops. Respiratory: Normal respiratory effort without tachypnea nor retractions. Breath sounds are clear and equal bilaterally.  Gastrointestinal: Soft and non-tender in all quadrants. No distention. There is no CVA tenderness. Genitourinary: deferred Musculoskeletal: Nontender with normal range of motion in all extremities. No lower extremity tenderness nor edema. Neurologic:  Normal speech and language. No  gross focal neurologic deficits are appreciated. Skin:  Skin is warm, dry and intact. No rash noted. Psychiatric: Mood and affect are normal. Patient exhibits appropriate insight and judgment.  ____________________________________________    LABS (pertinent positives/negatives)  Labs Reviewed  HCG, QUANTITATIVE, PREGNANCY  CBC  POC URINE PREG, ED  POCT PREGNANCY, URINE  ABO/RH    ____________________________________________   EKG  None  ____________________________________________    RADIOLOGY I have personally reviewed any xrays that were ordered on this patient: None  ____________________________________________   PROCEDURES  Procedure(s) performed: none  Critical Care performed: none  ____________________________________________   INITIAL IMPRESSION / ASSESSMENT AND PLAN / ED COURSE  Pertinent labs & imaging results that were available during my care of the patient were reviewed by me and considered in my medical decision making (see chart for details).  Patient with beta Quant less than 1. She is not pregnant which I informed her. Her spotting may very well be related to return if menstrual cycle given that she stopped breast-feeding approximately 8 weeks ago. Her shoulder pain is almost certainly related to muscle strain and should be treated conservatively with NSAIDs  ____________________________________________   FINAL CLINICAL IMPRESSION(S) / ED DIAGNOSES  Final diagnoses:  Muscle strain  Vaginal spotting     Jene Every, MD 01/02/15 1620

## 2015-01-02 NOTE — ED Notes (Addendum)
Pt presents to ED with c/o of a positive pregnancy test yesterday that was confirmed at home and Dr.'s office last week with mild intermittent abdominal cramping ; now has mild spotting. Pt also c/o of right shoulder since 2 days ago that feels like a stabbing, aching pain that pops "once in a while". Pt rates pain as 7/10.

## 2015-01-06 ENCOUNTER — Encounter: Payer: Self-pay | Admitting: Emergency Medicine

## 2015-01-06 ENCOUNTER — Emergency Department: Payer: Medicaid Other

## 2015-01-06 ENCOUNTER — Emergency Department
Admission: EM | Admit: 2015-01-06 | Discharge: 2015-01-06 | Disposition: A | Discharge status: Home / Self Care | Payer: Medicaid Other | Attending: Emergency Medicine | Admitting: Emergency Medicine

## 2015-01-06 DIAGNOSIS — Z880 Allergy status to penicillin: Secondary | ICD-10-CM | POA: Insufficient documentation

## 2015-01-06 DIAGNOSIS — Z87891 Personal history of nicotine dependence: Secondary | ICD-10-CM | POA: Diagnosis not present

## 2015-01-06 DIAGNOSIS — Z791 Long term (current) use of non-steroidal anti-inflammatories (NSAID): Secondary | ICD-10-CM | POA: Insufficient documentation

## 2015-01-06 DIAGNOSIS — Z792 Long term (current) use of antibiotics: Secondary | ICD-10-CM | POA: Insufficient documentation

## 2015-01-06 DIAGNOSIS — R0602 Shortness of breath: Secondary | ICD-10-CM | POA: Diagnosis present

## 2015-01-06 DIAGNOSIS — J45901 Unspecified asthma with (acute) exacerbation: Secondary | ICD-10-CM | POA: Diagnosis not present

## 2015-01-06 MED ORDER — PREDNISONE 20 MG PO TABS: 40.0000 mg | ORAL_TABLET | Freq: Every day | ORAL | Status: DC

## 2015-01-06 MED ORDER — PREDNISONE 20 MG PO TABS
ORAL_TABLET | ORAL | Status: AC
  Filled 2015-01-06: qty 2

## 2015-01-06 MED ORDER — ALBUTEROL SULFATE HFA 108 (90 BASE) MCG/ACT IN AERS: 2.0000 | INHALATION_SPRAY | Freq: Four times a day (QID) | RESPIRATORY_TRACT | Status: DC | PRN

## 2015-01-06 MED ORDER — PREDNISONE 20 MG PO TABS
40.0000 mg | ORAL_TABLET | Freq: Once | ORAL | Status: AC
  Administered 2015-01-06: 40 mg via ORAL

## 2015-01-06 NOTE — Discharge Instructions (Signed)
We believe that your symptoms are caused today by an exacerbation of your asthma.  Please take the prescribed medications and any medications that you have at home.  Follow up with your doctor as recommended.  If you develop any new or worsening symptoms, including but not limited to fever, persistent vomiting, worsening shortness of breath, or other symptoms that concern you, please return to the Emergency Department immediately.   Asthma, Acute Bronchospasm Acute bronchospasm caused by asthma is also referred to as an asthma attack. Bronchospasm means your air passages become narrowed. The narrowing is caused by inflammation and tightening of the muscles in the air tubes (bronchi) in your lungs. This can make it hard to breathe or cause you to wheeze and cough. CAUSES Possible triggers are:  Animal dander from the skin, hair, or feathers of animals.  Dust mites contained in house dust.  Cockroaches.  Pollen from trees or grass.  Mold.  Cigarette or tobacco smoke.  Air pollutants such as dust, household cleaners, hair sprays, aerosol sprays, paint fumes, strong chemicals, or strong odors.  Cold air or weather changes. Cold air may trigger inflammation. Winds increase molds and pollens in the air.  Strong emotions such as crying or laughing hard.  Stress.  Certain medicines such as aspirin or beta-blockers.  Sulfites in foods and drinks, such as dried fruits and wine.  Infections or inflammatory conditions, such as a flu, cold, or inflammation of the nasal membranes (rhinitis).  Gastroesophageal reflux disease (GERD). GERD is a condition where stomach acid backs up into your esophagus.  Exercise or strenuous activity. SIGNS AND SYMPTOMS   Wheezing.  Excessive coughing, particularly at night.  Chest tightness.  Shortness of breath. DIAGNOSIS  Your health care provider will ask you about your medical history and perform a physical exam. A chest X-ray or blood testing may  be performed to look for other causes of your symptoms or other conditions that may have triggered your asthma attack. TREATMENT  Treatment is aimed at reducing inflammation and opening up the airways in your lungs. Most asthma attacks are treated with inhaled medicines. These include quick relief or rescue medicines (such as bronchodilators) and controller medicines (such as inhaled corticosteroids). These medicines are sometimes given through an inhaler or a nebulizer. Systemic steroid medicine taken by mouth or given through an IV tube also can be used to reduce the inflammation when an attack is moderate or severe. Antibiotic medicines are only used if a bacterial infection is present.  HOME CARE INSTRUCTIONS   Rest.  Drink plenty of liquids. This helps the mucus to remain thin and be easily coughed up. Only use caffeine in moderation and do not use alcohol until you have recovered from your illness.  Do not smoke. Avoid being exposed to secondhand smoke.  You play a critical role in keeping yourself in good health. Avoid exposure to things that cause you to wheeze or to have breathing problems.  Keep your medicines up-to-date and available. Carefully follow your health care provider's treatment plan.  Take your medicine exactly as prescribed.  When pollen or pollution is bad, keep windows closed and use an air conditioner or go to places with air conditioning.  Asthma requires careful medical care. See your health care provider for a follow-up as advised. If you are more than [redacted] weeks pregnant and you were prescribed any new medicines, let your obstetrician know about the visit and how you are doing. Follow up with your health care provider as  directed.  After you have recovered from your asthma attack, make an appointment with your outpatient doctor to talk about ways to reduce the likelihood of future attacks. If you do not have a doctor who manages your asthma, make an appointment with  a primary care doctor to discuss your asthma. SEEK IMMEDIATE MEDICAL CARE IF:   You are getting worse.  You have trouble breathing. If severe, call your local emergency services (911 in the [place]  You develop chest pain or discomfort.  You are vomiting.  You are not able to keep fluids down.  You are coughing up yellow, green, brown, or bloody sputum.  You have a fever and your symptoms suddenly get worse.  You have trouble swallowing. MAKE SURE YOU:   Understand these instructions.  Will watch your condition.  Will get help right away if you are not doing well or get worse.   This information is not intended to replace advice given to you by your health care provider. Make sure you discuss any questions you have with your health care provider.   Document Released: 06/17/2006 Document Revised: 03/07/2013 Document Reviewed: 09/07/2012 Elsevier Interactive Patient Education [employer]2016 [employer] Inc[employer]

## 2015-01-06 NOTE — ED Notes (Signed)
Pt says she had a asthma attack earlier today; used inhaler and nebulizer which relieved her wheezing; still feeling short of breath on exertion; chest tightness which is normal for her after an asthma attack; sats 100% on room air; ambulatory without difficulty; speaking in complete coherent sentences; c/o nonproductive cough

## 2015-01-06 NOTE — ED Provider Notes (Signed)
[facility] Emergency Department Provider Note REMINDER - THIS NOTE IS NOT A FINAL MEDICAL RECORD UNTIL IT IS SIGNED. UNTIL THEN, THE CONTENT BELOW MAY REFLECT INFORMATION FROM A DOCUMENTATION TEMPLATE, NOT THE ACTUAL PATIENT VISIT. ____________________________________________  Time seen: Approximately 10:39 PM  I have reviewed the triage vital signs and the nursing notes.   HISTORY  Chief Complaint Shortness of Breath    HPI Kristy Reid is a 19 y.o. female previous history of asthma, hospitalized once but never intubated or in the ICU per the patient. Throughout the day todaypatient has noticed some very mild wheezing. She reports she feels slightly short of breath at times with walking. She has an inhaler and albuterol nebulizer at home, she is use these and said that they're quite helpful but she still feels like she has some slight occasional wheezing and may be having a slight asthma exacerbation.  She denies feeling short of breath or desiring need of a nebulizer at this time, she states that she likely needs some prednisone for the next few days. No fevers or chills. No chest pain.  Denies pregnancy. No nausea or vomiting.  Occasionally feels just slightly tight in the chest with walking today.  Past Medical History  Diagnosis Date  . Asthma   . Anemia   . Anxiety   . Anemia     There are no active problems to display for this patient.   Past Surgical History  Procedure Laterality Date  . Wrist sugery    . Bunionectomy Bilateral     feet    Current Outpatient Rx  Name  Route  Sig  Dispense  Refill  . oxyCODONE-acetaminophen (PERCOCET/ROXICET) 5-325 MG tablet   Oral   Take 1 tablet by mouth every 4 (four) hours as needed for severe pain. 1-2 tablets every 4-6 hours prn         . albuterol (PROVENTIL HFA;VENTOLIN HFA) 108 (90 BASE) MCG/ACT inhaler   Inhalation   Inhale 2 puffs into the lungs every 6 (six) hours as needed for wheezing  or shortness of breath.   1 Inhaler   2   . brompheniramine-pseudoephedrine-DM 30-2-10 MG/5ML syrup   Oral   Take 5 mLs by mouth 4 (four) times daily as needed.   120 mL   0   . cyclobenzaprine (FLEXERIL) 10 MG tablet   Oral   Take 1 tablet (10 mg total) by mouth 3 (three) times daily as needed for muscle spasms.   30 tablet   0   . ketorolac (TORADOL) 10 MG tablet   Oral   Take 1 tablet (10 mg total) by mouth every 6 (six) hours as needed.   20 tablet   0   . meloxicam (MOBIC) 15 MG tablet   Oral   Take 1 tablet (15 mg total) by mouth daily.   30 tablet   2   . predniSONE (DELTASONE) 20 MG tablet   Oral   Take 2 tablets (40 mg total) by mouth daily with breakfast.   10 tablet   0   . sulfamethoxazole-trimethoprim (BACTRIM DS,SEPTRA DS) 800-160 MG per tablet   Oral   Take 1 tablet by mouth 2 (two) times daily.   20 tablet   0     Allergies Penicillins and Rocephin  History reviewed. No pertinent family history.  Social History Social History  Substance Use Topics  . Smoking status: Former Games developermoker  . Smokeless tobacco: Never Used  . Alcohol Use:  No    Review of Systems Constitutional: No fever/chills Eyes: No visual changes. ENT: No sore throat. Cardiovascular: Denies chest pain. Respiratory: See history of present illness, denies any shortness of breath at present time. Gastrointestinal: No abdominal pain.  No nausea, no vomiting.  No diarrhea.  No constipation. Genitourinary: Negative for dysuria. Musculoskeletal: Negative for back pain. Skin: Negative for rash. Neurological: Negative for headaches, focal weakness or numbness.  10-point ROS otherwise negative.  ____________________________________________   PHYSICAL EXAM:  VITAL SIGNS: ED Triage Vitals  Enc Vitals Group     BP 01/06/15 2056 125/67 mmHg     Pulse Rate 01/06/15 2056 96     Resp 01/06/15 2056 18     Temp 01/06/15 2056 99 F (37.2 C)     Temp Source 01/06/15 2056 Oral      SpO2 01/06/15 2056 100 %     Weight 01/06/15 2056 132 lb (59.875 kg)     Height 01/06/15 2056  (1.575 m)     Head Cir --      Peak Flow --      Pain Score 01/06/15 2057 7     Pain Loc --      Pain Edu? --      Excl. in GC? --    Constitutional: Alert and oriented. Well appearing and in no acute distress. Eyes: Conjunctivae are normal. PERRL. EOMI. Head: Atraumatic. Nose: No congestion/rhinnorhea. Mouth/Throat: Mucous membranes are moist.  Oropharynx non-erythematous. Neck: No stridor.   Cardiovascular: Normal rate, regular rhythm. Grossly normal heart sounds.  Good peripheral circulation. Respiratory: Normal respiratory effort.  No retractions. Lungs CTAB. Speaks in full sentences without any distress. No wheezing. Absolute no sign of increased work of breathing. Musculoskeletal: No lower extremityedema.   Neurologic:  Normal speech and language. No gross focal neurologic deficits are appreciated. Skin:  Skin is warm, dry and intact. No rash noted. Psychiatric: Mood and affect are normal. Speech and behavior are normal.  ____________________________________________   LABS (all labs ordered are listed, but only abnormal results are displayed)  Labs Reviewed - No data to display ____________________________________________  EKG   ____________________________________________  RADIOLOGY  CLINICAL DATA: 19 year old female with complaint of asthma attack this morning.  EXAM: CHEST 2 VIEW  COMPARISON: No priors.  FINDINGS: Lung volumes are normal. No consolidative airspace disease. No pleural effusions. No pneumothorax. No pulmonary nodule or mass noted. Pulmonary vasculature and the cardiomediastinal silhouette are within normal limits.  IMPRESSION: No radiographic evidence of acute cardiopulmonary disease. ____________________________________________   PROCEDURES  Procedure(s) performed: None  Critical Care performed:  No  ____________________________________________   INITIAL IMPRESSION / ASSESSMENT AND PLAN / ED COURSE  Pertinent labs & imaging results that were available during my care of the patient were reviewed by me and considered in my medical decision making (see chart for details).  Patient presents for mild wheezing and possible asthma exacerbation today. Presently no signs of increased work of breathing, but based on her history it is possible she may be having an early mild exacerbation. We'll place her on prednisone and refill her albuterol prescription per her request. Careful return precautions advised. Patient agreeable with plan. No signs or symptoms to suggest acute cardiac, infectious, pneumonia, or other concerning acute etiology at this time.  Return precautions advised. ____________________________________________   FINAL CLINICAL IMPRESSION(S) / ED DIAGNOSES  Final diagnoses:  Mild asthma exacerbation      Sharyn Creamer, MD 01/06/15 2247

## 2015-01-15 ENCOUNTER — Encounter: Payer: Self-pay | Admitting: Emergency Medicine

## 2015-01-15 ENCOUNTER — Emergency Department
Admission: EM | Admit: 2015-01-15 | Discharge: 2015-01-15 | Disposition: A | Discharge status: Home / Self Care | Payer: Medicaid Other | Attending: Emergency Medicine | Admitting: Emergency Medicine

## 2015-01-15 DIAGNOSIS — F419 Anxiety disorder, unspecified: Secondary | ICD-10-CM | POA: Diagnosis not present

## 2015-01-15 DIAGNOSIS — Z3202 Encounter for pregnancy test, result negative: Secondary | ICD-10-CM | POA: Insufficient documentation

## 2015-01-15 DIAGNOSIS — N938 Other specified abnormal uterine and vaginal bleeding: Secondary | ICD-10-CM | POA: Diagnosis not present

## 2015-01-15 DIAGNOSIS — Z791 Long term (current) use of non-steroidal anti-inflammatories (NSAID): Secondary | ICD-10-CM | POA: Insufficient documentation

## 2015-01-15 DIAGNOSIS — Z880 Allergy status to penicillin: Secondary | ICD-10-CM | POA: Diagnosis not present

## 2015-01-15 DIAGNOSIS — R42 Dizziness and giddiness: Secondary | ICD-10-CM | POA: Insufficient documentation

## 2015-01-15 DIAGNOSIS — Z7952 Long term (current) use of systemic steroids: Secondary | ICD-10-CM | POA: Diagnosis not present

## 2015-01-15 DIAGNOSIS — Z792 Long term (current) use of antibiotics: Secondary | ICD-10-CM | POA: Diagnosis not present

## 2015-01-15 DIAGNOSIS — N939 Abnormal uterine and vaginal bleeding, unspecified: Secondary | ICD-10-CM | POA: Diagnosis present

## 2015-01-15 DIAGNOSIS — Z87891 Personal history of nicotine dependence: Secondary | ICD-10-CM | POA: Insufficient documentation

## 2015-01-15 LAB — BASIC METABOLIC PANEL WITH GFR
Anion gap: 8 (ref 5–15)
BUN: 13 mg/dL (ref 6–20)
CO2: 20 mmol/L — ABNORMAL LOW (ref 22–32)
Calcium: 9.5 mg/dL (ref 8.9–10.3)
Chloride: 109 mmol/L (ref 101–111)
Creatinine, Ser: 0.93 mg/dL (ref 0.44–1.00)
GFR calc Af Amer: >60 mL/min
GFR calc non Af Amer: >60 mL/min
Glucose, Bld: 81 mg/dL (ref 65–99)
Potassium: 3.6 mmol/L (ref 3.5–5.1)
Sodium: 137 mmol/L (ref 135–145)

## 2015-01-15 LAB — URINALYSIS COMPLETE WITH MICROSCOPIC (ARMC ONLY)
Bacteria, UA: NONE SEEN
Bilirubin Urine: NEGATIVE
Glucose, UA: NEGATIVE mg/dL
Hgb urine dipstick: NEGATIVE
Ketones, ur: NEGATIVE mg/dL
Leukocytes, UA: NEGATIVE
Nitrite: NEGATIVE
Protein, ur: NEGATIVE mg/dL
Specific Gravity, Urine: 1.024 (ref 1.005–1.030)
pH: 6 (ref 5.0–8.0)

## 2015-01-15 LAB — CBC
HEMATOCRIT: 40.9 % (ref 35.0–47.0)
HEMOGLOBIN: 13.2 g/dL (ref 12.0–16.0)
MCH: 27.6 pg (ref 26.0–34.0)
MCHC: 32.3 g/dL (ref 32.0–36.0)
MCV: 85.4 fL (ref 80.0–100.0)
Platelets: 260 10*3/uL (ref 150–440)
RBC: 4.79 MIL/uL (ref 3.80–5.20)
RDW: 14.2 % (ref 11.5–14.5)
WBC: 6.1 10*3/uL (ref 3.6–11.0)

## 2015-01-15 NOTE — ED Notes (Signed)
Patient reports vaginal bleeding for 2.5 weeks. Reports history of anemia. States she has been feeling dizzy as well. Patient states period will start and last for a few hours, then stop, may start back for a couple of days and stop again.

## 2015-01-15 NOTE — Discharge Instructions (Signed)

## 2015-01-15 NOTE — ED Provider Notes (Signed)
[facility] Emergency Department Provider Note  ____________________________________________  Time seen: 11:30 AM  I have reviewed the triage vital signs and the nursing notes.   HISTORY  Chief Complaint Vaginal Bleeding and Dizziness    HPI Kristy Reid is a 19 y.o. female who presents with complaints of vaginal bleeding for approximately 2 weeks which has been intermittent. It is a range from spotting to heavy bleeding. Currently is just spotting. She reports she has had 3 normal periods in the last few months after havingstopped breast-feeding her son. She is also on the depo shot and she seems to be having normal regular periods with it. She denies fevers chills. Occasionally she does feel dizzy but not currently. She wanted to make sure she is not losing too much blood as she has had anemia in the past from heavy vaginal bleeding. This is why she started the Depo shot in the first place     Past Medical History  Diagnosis Date  . Asthma   . Anemia   . Anxiety   . Anemia     There are no active problems to display for this patient.   Past Surgical History  Procedure Laterality Date  . Wrist sugery    . Bunionectomy Bilateral     feet    Current Outpatient Rx  Name  Route  Sig  Dispense  Refill  . albuterol (PROVENTIL HFA;VENTOLIN HFA) 108 (90 BASE) MCG/ACT inhaler   Inhalation   Inhale 2 puffs into the lungs every 6 (six) hours as needed for wheezing or shortness of breath.   1 Inhaler   2   . brompheniramine-pseudoephedrine-DM 30-2-10 MG/5ML syrup   Oral   Take 5 mLs by mouth 4 (four) times daily as needed.   120 mL   0   . cyclobenzaprine (FLEXERIL) 10 MG tablet   Oral   Take 1 tablet (10 mg total) by mouth 3 (three) times daily as needed for muscle spasms.   30 tablet   0   . ketorolac (TORADOL) 10 MG tablet   Oral   Take 1 tablet (10 mg total) by mouth every 6 (six) hours as needed.   20 tablet   0   . meloxicam (MOBIC)  15 MG tablet   Oral   Take 1 tablet (15 mg total) by mouth daily.   30 tablet   2   . oxyCODONE-acetaminophen (PERCOCET/ROXICET) 5-325 MG tablet   Oral   Take 1 tablet by mouth every 4 (four) hours as needed for severe pain. 1-2 tablets every 4-6 hours prn         . predniSONE (DELTASONE) 20 MG tablet   Oral   Take 2 tablets (40 mg total) by mouth daily with breakfast.   10 tablet   0   . sulfamethoxazole-trimethoprim (BACTRIM DS,SEPTRA DS) 800-160 MG per tablet   Oral   Take 1 tablet by mouth 2 (two) times daily.   20 tablet   0     Allergies Penicillins and Rocephin  No family history on file.  Social History Social History  Substance Use Topics  . Smoking status: Former Games developer  . Smokeless tobacco: Never Used  . Alcohol Use: No    Review of Systems  Constitutional: Negative for fever. Eyes: Negative for visual changes. ENT: Negative for sore throat Cardiovascular: Negative for chest pain. Respiratory: Negative for shortness of breath. Gastrointestinal: Negative for abdominal pain, vomiting and diarrhea. Genitourinary: Negative for dysuria. Positive for  vaginal bleeding Musculoskeletal: Negative for back pain. Skin: Negative for rash. Neurological: Negative for . Positive for dizziness Psychiatric: History of anxiety    ____________________________________________   PHYSICAL EXAM:  VITAL SIGNS: ED Triage Vitals  Enc Vitals Group     BP 01/15/15 0954 138/78 mmHg     Pulse Rate 01/15/15 0954 111     Resp 01/15/15 0954 20     Temp 01/15/15 0954 98.1 F (36.7 C)     Temp Source 01/15/15 0954 Oral     SpO2 01/15/15 0954 99 %     Weight 01/15/15 0954 132 lb (59.875 kg)     Height 01/15/15 0954  (1.575 m)     Head Cir --      Peak Flow --      Pain Score 01/15/15 0955 0     Pain Loc --      Pain Edu? --      Excl. in GC? --      Constitutional: Alert and oriented. Well appearing and in no distress. Eyes: Conjunctivae are normal.   ENT   Head: Normocephalic and atraumatic.   Mouth/Throat: Mucous membranes are moist. Cardiovascular: Normal rate, heart rate checked by me and is 88, regular rhythm. Normal and symmetric distal pulses are present in all extremities. No murmurs, rubs, or gallops. Respiratory: Normal respiratory effort without tachypnea nor retractions. Breath sounds are clear and equal bilaterally.  Gastrointestinal: Soft and non-tender in all quadrants. No distention. There is no CVA tenderness. Genitourinary: deferred per specialist Musculoskeletal: Nontender with normal range of motion in all extremities. No lower extremity tenderness nor edema. Neurologic:  Normal speech and language. No gross focal neurologic deficits are appreciated. Skin:  Skin is warm, dry and intact. No rash noted. Psychiatric: Mood and affect are normal. Patient exhibits appropriate insight and judgment.  ____________________________________________    LABS (pertinent positives/negatives)  Labs Reviewed  BASIC METABOLIC PANEL - Abnormal; Notable for the following:    CO2 20 (*)    All other components within normal limits  URINALYSIS COMPLETEWITH MICROSCOPIC ([facility] ONLY) - Abnormal; Notable for the following:    Color, Urine YELLOW (*)    APPearance CLEAR (*)    Squamous Epithelial / LPF 0-5 (*)    All other components within normal limits  CBC  POC URINE PREG, ED    ____________________________________________   EKG  ED ECG REPORT I, Jene Every, the attending physician, personally viewed and interpreted this ECG.   Date: 01/15/2015  EKG Time: 10:19 AM  Rate: 112  Rhythm: , sinus tachycardia  Axis: Normal  Intervals:none  ST&T Change: None   ____________________________________________    RADIOLOGY I have personally reviewed any xrays that were ordered on this patient: None  ____________________________________________   PROCEDURES  Procedure(s) performed: none  Critical Care  performed: none  ____________________________________________   INITIAL IMPRESSION / ASSESSMENT AND PLAN / ED COURSE  Pertinent labs & imaging results that were available during my care of the patient were reviewed by me and considered in my medical decision making (see chart for details).  Patient well-appearing and in no distress. Initial vital showed tachycardia however on my exam heart rate normal 88 bpm. Exam is benign. Hemoglobin is increased from prior. Patient is new to the area and needs a new gynecologist. We will refer her to a local gynecologist for follow-up for her dysfunctional uterine bleeding, likely related to breakthrough bleeding from Depo-Provera  Urine test negative for pregnancy  ____________________________________________   FINAL  CLINICAL IMPRESSION(S) / ED DIAGNOSES  Final diagnoses:  Dysfunctional uterine bleeding     Jene Everyobert Ayo Guarino, MD 01/15/15 1148

## 2015-01-16 ENCOUNTER — Encounter: Payer: Self-pay | Admitting: Obstetrics and Gynecology

## 2015-01-16 ENCOUNTER — Ambulatory Visit: Payer: Self-pay | Admitting: Obstetrics and Gynecology

## 2015-01-16 ENCOUNTER — Ambulatory Visit (INDEPENDENT_AMBULATORY_CARE_PROVIDER_SITE_OTHER): Payer: Medicaid Other | Admitting: Obstetrics and Gynecology

## 2015-01-16 VITALS — BP 116/73 | HR 106 | Ht 62.0 in | Wt 133.0 lb

## 2015-01-16 DIAGNOSIS — N946 Dysmenorrhea, unspecified: Secondary | ICD-10-CM | POA: Diagnosis not present

## 2015-01-16 DIAGNOSIS — N941 Unspecified dyspareunia: Secondary | ICD-10-CM | POA: Diagnosis not present

## 2015-01-16 DIAGNOSIS — N939 Abnormal uterine and vaginal bleeding, unspecified: Secondary | ICD-10-CM

## 2015-01-16 DIAGNOSIS — D649 Anemia, unspecified: Secondary | ICD-10-CM | POA: Diagnosis not present

## 2015-01-16 MED ORDER — IBUPROFEN 800 MG PO TABS: 800.0000 mg | ORAL_TABLET | Freq: Three times a day (TID) | ORAL | Status: DC

## 2015-01-16 MED ORDER — ETONOGESTREL-ETHINYL ESTRADIOL 0.12-0.015 MG/24HR VA RING: VAGINAL_RING | VAGINAL | Status: DC

## 2015-01-16 NOTE — Patient Instructions (Addendum)
1.  Perform menstrual calendar, monitoring regarding bleeding. 2.  Start NuvaRing for cycle regulation, painful periods, and contraception. 3.  Motrin 800 mg 3 times a day for severe cramps. 4.  Return in 3 months for follow-up 5.  Literature on endometriosis, laparoscopy, and NuvaRing is given.

## 2015-01-16 NOTE — Progress Notes (Signed)
Patient ID: Kristy Reid, female   DOB: 12/06/1995, 19 y.o.   MRN: [id number] aub-2 1/2 weeks- heavy at times  last depo - 11/2014  cramps comes and goes Pos upt- at home neg at ER yesterday.  GYN ENCOUNTER NOTE  Subjective:       Kristy Reid is a 19 y.o. 643P0 female is here for gynecologic evaluation of the following issues:  1. ER follow-up regarding abnormal uterine bleeding  The patient is a 19 year old single African-American female, para 1, 0-1, using Depo-Provera (first injection September 2016).  For contraception, developed onset of heavy bleeding that prompted ER visit.  Patient has long history of anemia and had to undergo 2 transfusions of packed red cells and 6.  Iron infusions during her last pregnancy. ER evaluation demonstrated a normal hemoglobin and hematocrit. Patient has previously been on Nexplanon and Depo-Provera with amenorrhea results. Patient has history of regular menstrual cycles on a monthly basis which are heavy, lasting upwards of 7 days.  She does have associated severe dysmenorrhea with central pelvic cramping, low backache, no significant radiation, and occasional left-sided discomfort.  She does experience deep thrusting dyspareunia.   Gynecologic History Patient's last menstrual period was 01/06/2015 (exact date). Contraception: Depo-Provera injections Last Pap: na.  Last mammogram: na  Obstetric History OB History  Gravida Para Term Preterm AB SAB TAB Ectopic Multiple Living  3         1    # Outcome Date GA Lbr Len/2nd Weight Sex Delivery Anes PTL Lv  3 Gravida           2 Gravida           1 [place]Gravida               Past Medical History  Diagnosis Date  . Asthma   . Anemia   . Anxiety   . Anemia   . Chronic back pain     Past Surgical History  Procedure Laterality Date  . Wrist sugery    . Bunionectomy Bilateral     feet    Current Outpatient Prescriptions on File Prior to Visit  Medication Sig Dispense Refill  . albuterol  (PROVENTIL HFA;VENTOLIN HFA) 108 (90 BASE) MCG/ACT inhaler Inhale 2 puffs into the lungs every 6 (six) hours as needed for wheezing or shortness of breath. 1 Inhaler 2  . cyclobenzaprine (FLEXERIL) 10 MG tablet Take 1 tablet (10 mg total) by mouth 3 (three) times daily as needed for muscle spasms. 30 tablet 0  . meloxicam (MOBIC) 15 MG tablet Take 1 tablet (15 mg total) by mouth daily. 30 tablet 2  . oxyCODONE-acetaminophen (PERCOCET/ROXICET) 5-325 MG tablet Take 1 tablet by mouth every 4 (four) hours as needed for severe pain. 1-2 tablets every 4-6 hours prn     No current facility-administered medications on file prior to visit.    Allergies  Allergen Reactions  . Penicillins   . Rocephin [Ceftriaxone] Hives    Social History   Social History  . Marital Status: Single    Spouse Name: N/A  . Number of Children: N/A  . Years of Education: N/A   Occupational History  . Not on file.   Social History Main Topics  . Smoking status: Former Games developermoker  . Smokeless tobacco: Never Used  . Alcohol Use: No  . Drug Use: No  . Sexual Activity: Yes    Birth Control/ Protection: Injection   Other Topics Concern  . Not on file  Social History Narrative    Family History  Problem Relation Age of Onset  . Diabetes Mother   . Diabetes Maternal Grandmother   . Heart disease Maternal Grandmother   . Cancer Neg Hx   . Ovarian cancer Neg Hx   No family history of endometriosis  The following portions of the patient's history were reviewed and updated as appropriate: allergies, current medications, past family history, past medical history, past social history, past surgical history and problem list.  Review of Systems Review of Systems - General ROS: negative for - chills, fatigue, fever, hot flashes, malaise or night sweats Hematological and Lymphatic ROS: negative for - bleeding problems or swollen lymph nodes Gastrointestinal ROS: negative for - abdominal pain, blood in stools, change  in bowel habits and nausea/vomiting Musculoskeletal ROS: negative for - joint pain, muscle pain or muscular weakness Genito-Urinary ROS: negative for -  dysuria, genital discharge, genital ulcers, hematuria, incontinence,POSITIVE- irregular/heavy menses, pelvic pain, change in menstrual cycle, dysmenorrhea, dyspareunia  Objective:   BP 116/73 mmHg  Pulse 106  Ht  (1.575 m)  Wt 133 lb (60.328 kg)  BMI 24.32 kg/m2  LMP 01/06/2015 (Exact Date) CONSTITUTIONAL: Well-developed, well-nourished female in no acute distress.  HENT:  Normocephalic, atraumatic.  NECK: Normal range of motion, supple, no masses.  Normal thyroid.  SKIN: Skin is warm and dry. No rash noted. Not diaphoretic. No erythema. No pallor. NEUROLGIC: Alert and oriented to person, place, and time. PSYCHIATRIC: Normal mood and affect. Normal behavior. Normal judgment and thought content. CARDIOVASCULAR:Not Examined RESPIRATORY: Not Examined BREASTS: Not Examined ABDOMEN: Soft, non distended; Non tender.  No Organomegaly. PELVIC:  External Genitalia: Normal  BUS: Normal  Vagina: Normal  Cervix: Normal; 2/4.  Cervical motion tenderness  Uterus: Normal size, shape,consistency, mobile ;2/4 tender  Adnexa: Right adnexa nontender; left adnexa 2/4 tender, nonpalpable  RV: Normal External exam  Bladder: Nontender MUSCULOSKELETAL: Normal range of motion. No tenderness.  No cyanosis, clubbing, or edema.     Assessment:   1.  Abnormal uterine bleeding on Depo-Provera. 2.  History of severe dysmenorrhea, deep thrusting dyspareunia. 3.  Possible endometriosis   Plan:   1.  Begin NuvaRing for cycle regulation, dysmenorrhea control. 2.  Menstrual calendar monitor. 3.  Motrin 80 mg 3 times a day for Dysmenorrhea 4.  Return in 3 months for follow-up 5.  Literature on endometriosis, laparoscopy, and NuvaRing given

## 2015-01-18 LAB — POCT PREGNANCY, URINE: Preg Test, Ur: NEGATIVE

## 2015-02-14 ENCOUNTER — Emergency Department
Admission: EM | Admit: 2015-02-14 | Discharge: 2015-02-15 | Disposition: A | Discharge status: Home / Self Care | Payer: Medicaid Other | Attending: Emergency Medicine | Admitting: Emergency Medicine

## 2015-02-14 ENCOUNTER — Emergency Department: Payer: Medicaid Other

## 2015-02-14 ENCOUNTER — Encounter: Payer: Self-pay | Admitting: *Deleted

## 2015-02-14 DIAGNOSIS — Z3202 Encounter for pregnancy test, result negative: Secondary | ICD-10-CM | POA: Insufficient documentation

## 2015-02-14 DIAGNOSIS — Z87891 Personal history of nicotine dependence: Secondary | ICD-10-CM | POA: Diagnosis not present

## 2015-02-14 DIAGNOSIS — Z880 Allergy status to penicillin: Secondary | ICD-10-CM | POA: Insufficient documentation

## 2015-02-14 DIAGNOSIS — K5901 Slow transit constipation: Secondary | ICD-10-CM | POA: Diagnosis not present

## 2015-02-14 DIAGNOSIS — K5900 Constipation, unspecified: Secondary | ICD-10-CM | POA: Diagnosis present

## 2015-02-14 DIAGNOSIS — Z791 Long term (current) use of non-steroidal anti-inflammatories (NSAID): Secondary | ICD-10-CM | POA: Insufficient documentation

## 2015-02-14 DIAGNOSIS — R1084 Generalized abdominal pain: Secondary | ICD-10-CM

## 2015-02-14 DIAGNOSIS — Z79899 Other long term (current) drug therapy: Secondary | ICD-10-CM | POA: Diagnosis not present

## 2015-02-14 LAB — CBC
HCT: 41.9 % (ref 35.0–47.0)
Hemoglobin: 13.7 g/dL (ref 12.0–16.0)
MCH: 27 pg (ref 26.0–34.0)
MCHC: 32.6 g/dL (ref 32.0–36.0)
MCV: 82.8 fL (ref 80.0–100.0)
PLATELETS: 318 10*3/uL (ref 150–440)
RBC: 5.06 MIL/uL (ref 3.80–5.20)
RDW: 14 % (ref 11.5–14.5)
WBC: 11.1 10*3/uL — ABNORMAL HIGH (ref 3.6–11.0)

## 2015-02-14 LAB — URINALYSIS COMPLETE WITH MICROSCOPIC (ARMC ONLY)
Bilirubin Urine: NEGATIVE
Glucose, UA: NEGATIVE mg/dL
KETONES UR: NEGATIVE mg/dL
NITRITE: NEGATIVE
PH: 6 (ref 5.0–8.0)
PROTEIN: NEGATIVE mg/dL
SPECIFIC GRAVITY, URINE: 1.009 (ref 1.005–1.030)

## 2015-02-14 LAB — COMPREHENSIVE METABOLIC PANEL
ALT: 13 U/L — ABNORMAL LOW (ref 14–54)
ANION GAP: 11 (ref 5–15)
AST: 19 U/L (ref 15–41)
Albumin: 4.2 g/dL (ref 3.5–5.0)
Alkaline Phosphatase: 89 U/L (ref 38–126)
BUN: 9 mg/dL (ref 6–20)
CHLORIDE: 105 mmol/L (ref 101–111)
CO2: 22 mmol/L (ref 22–32)
Calcium: 10.1 mg/dL (ref 8.9–10.3)
Creatinine, Ser: 0.74 mg/dL (ref 0.44–1.00)
GFR calc non Af Amer: >60 mL/min (ref >60)
Glucose, Bld: 101 mg/dL — ABNORMAL HIGH (ref 65–99)
Potassium: 4 mmol/L (ref 3.5–5.1)
SODIUM: 138 mmol/L (ref 135–145)
Total Bilirubin: 0.3 mg/dL (ref 0.3–1.2)
Total Protein: 9.6 g/dL — ABNORMAL HIGH (ref 6.5–8.1)

## 2015-02-14 LAB — LIPASE, BLOOD: Lipase: 32 U/L (ref 11–51)

## 2015-02-14 LAB — POC URINE PREG, ED: PREG TEST UR: NEGATIVE

## 2015-02-14 MED ORDER — ONDANSETRON HCL 4 MG/2ML IJ SOLN
4.0000 mg | Freq: Once | INTRAMUSCULAR | Status: AC
  Administered 2015-02-14: 4 mg via INTRAVENOUS
  Filled 2015-02-14: qty 2

## 2015-02-14 MED ORDER — MORPHINE SULFATE (PF) 4 MG/ML IV SOLN
4.0000 mg | Freq: Once | INTRAVENOUS | Status: AC
  Administered 2015-02-14: 4 mg via INTRAVENOUS
  Filled 2015-02-14: qty 1

## 2015-02-14 MED ORDER — MORPHINE SULFATE (PF) 2 MG/ML IV SOLN
2.0000 mg | Freq: Once | INTRAVENOUS | Status: AC
  Administered 2015-02-14: 2 mg via INTRAVENOUS

## 2015-02-14 MED ORDER — MORPHINE SULFATE (PF) 2 MG/ML IV SOLN
2.0000 mg | Freq: Once | INTRAVENOUS | Status: AC
  Administered 2015-02-14: 2 mg via INTRAVENOUS
  Filled 2015-02-14: qty 1

## 2015-02-14 MED ORDER — SODIUM CHLORIDE 0.9 % IV BOLUS (SEPSIS)
1000.0000 mL | Freq: Once | INTRAVENOUS | Status: AC
  Administered 2015-02-14: 1000 mL via INTRAVENOUS

## 2015-02-14 MED ORDER — IOHEXOL 240 MG/ML SOLN
25.0000 mL | Freq: Once | INTRAMUSCULAR | Status: AC | PRN
  Administered 2015-02-14: 25 mL via ORAL
  Filled 2015-02-14: qty 25

## 2015-02-14 MED ORDER — MORPHINE SULFATE (PF) 2 MG/ML IV SOLN
INTRAVENOUS | Status: AC
Start: 2015-02-14 — End: 2015-02-14
  Administered 2015-02-14: 2 mg via INTRAVENOUS
  Filled 2015-02-14: qty 1

## 2015-02-14 MED ORDER — IOHEXOL 300 MG/ML  SOLN
100.0000 mL | Freq: Once | INTRAMUSCULAR | Status: AC | PRN
  Administered 2015-02-14: 100 mL via INTRAVENOUS

## 2015-02-14 MED ORDER — ONDANSETRON 4 MG PO TBDP: 4.0000 mg | ORAL_TABLET | Freq: Four times a day (QID) | ORAL | Status: DC | PRN

## 2015-02-14 MED ORDER — HYDROCODONE-ACETAMINOPHEN 5-325 MG PO TABS: 1.0000 | ORAL_TABLET | Freq: Four times a day (QID) | ORAL | Status: DC | PRN

## 2015-02-14 NOTE — ED Notes (Signed)
States she has not had a full Bowel movement in a month, states she has pooped "pebbles", states hx of constipation, pt states she has taken stool softness, laxatives, and suppository's with no success, states normally an enema helps, belly tender to palpation

## 2015-02-14 NOTE — ED Provider Notes (Signed)
[facility] Emergency Department Provider Note REMINDER - THIS NOTE IS NOT A FINAL MEDICAL RECORD UNTIL IT IS SIGNED. UNTIL THEN, THE CONTENT BELOW MAY REFLECT INFORMATION FROM A DOCUMENTATION TEMPLATE, NOT THE ACTUAL PATIENT VISIT. ____________________________________________  Time seen: Approximately 8:27 PM  I have reviewed the triage vital signs and the nursing notes.   HISTORY  Chief Complaint Constipation    HPI Kristy Reid is a 19 y.o. female who reports that she has not been ill have a bowel movement for approximately a month except for very small and occasional tiny stools. She reports that this is been a chronic problem for her for years, the time she gets very constipated. She feels bloated and uncomfortable.  Denies pregnancy, she has had some very mild spotting. She has infrequently. Denies on both Depoand NuvaRing. No vaginal pain or discharge aside from slight spotting.No urinary symptoms. No vomiting, mild nausea. Reports a very bloated achy crampy abdominal pain slowly worsening over the last few days.  Patient is very clear symptoms that this is happened to her many times before occasionally requiring enemas. No history of bowel obstructions.   Past Medical History  Diagnosis Date  . Asthma   . Anemia   . Anxiety   . Anemia   . Chronic back pain     Patient Active Problem List   Diagnosis Date Noted  . Anemia 01/16/2015  . Dysmenorrhea 01/16/2015  . Dyspareunia in female 01/16/2015  . Abnormal uterine bleeding 01/16/2015    Past Surgical History  Procedure Laterality Date  . Wrist sugery    . Bunionectomy Bilateral     feet    Current Outpatient Rx  Name  Route  Sig  Dispense  Refill  . albuterol (PROVENTIL HFA;VENTOLIN HFA) 108 (90 BASE) MCG/ACT inhaler   Inhalation   Inhale 2 puffs into the lungs every 6 (six) hours as needed for wheezing or shortness of breath.   1 Inhaler   2   . cyclobenzaprine (FLEXERIL) 10 MG  tablet   Oral   Take 1 tablet (10 mg total) by mouth 3 (three) times daily as needed for muscle spasms.   30 tablet   0   . etonogestrel-ethinyl estradiol (NUVARING) 0.12-0.015 MG/24HR vaginal ring      Insert vaginally and leave in place for 3 consecutive weeks, then remove for 1 week.   1 each   12   . HYDROcodone-acetaminophen (NORCO/VICODIN) 5-325 MG tablet   Oral   Take 1 tablet by mouth every 6 (six) hours as needed for moderate pain.   8 tablet   0   . ibuprofen (ADVIL,MOTRIN) 800 MG tablet   Oral   Take 1 tablet (800 mg total) by mouth 3 (three) times daily.   30 tablet   1   . medroxyPROGESTERone (DEPO-PROVERA) 150 MG/ML injection   Intramuscular   Inject 150 mg into the muscle every 3 (three) months.         . meloxicam (MOBIC) 15 MG tablet   Oral   Take 1 tablet (15 mg total) by mouth daily.   30 tablet   2   . ondansetron (ZOFRAN ODT) 4 MG disintegrating tablet   Oral   Take 1 tablet (4 mg total) by mouth every 6 (six) hours as needed for nausea or vomiting.   20 tablet   0   . oxyCODONE-acetaminophen (PERCOCET/ROXICET) 5-325 MG tablet   Oral   Take 1 tablet by mouth every 4 (four)  hours as needed for severe pain. 1-2 tablets every 4-6 hours prn           Allergies Penicillins and Rocephin  Family History  Problem Relation Age of Onset  . Diabetes Mother   . Diabetes Maternal Grandmother   . Heart disease Maternal Grandmother   . Cancer Neg Hx   . Ovarian cancer Neg Hx     Social History Social History  Substance Use Topics  . Smoking status: Former Games developer  . Smokeless tobacco: Never Used  . Alcohol Use: No    Review of Systems Constitutional: No fever/chills Eyes: No visual changes. ENT: No sore throat. Cardiovascular: Denies chest pain. Respiratory: Denies shortness of breath. Gastrointestinal: No diarrhea. Genitourinary: Negative for dysuria. Denies pregnancy. Musculoskeletal: Negative for back pain. Skin: Negative for  rash. Neurological: Negative for headaches, focal weakness or numbness.  10-point ROS otherwise negative.  ____________________________________________   PHYSICAL EXAM:  VITAL SIGNS: ED Triage Vitals  Enc Vitals Group     BP 02/14/15 1808 128/84 mmHg     Pulse Rate 02/14/15 1808 104     Resp 02/14/15 1808 20     Temp 02/14/15 1808 98.3 F (36.8 C)     Temp Source 02/14/15 1808 Oral     SpO2 02/14/15 1808 99 %     Weight 02/14/15 1808 140 lb (63.504 kg)     Height 02/14/15 1808  (1.575 m)     Head Cir --      Peak Flow --      Pain Score 02/14/15 1809 10     Pain Loc --      Pain Edu? --      Excl. in GC? --    Constitutional: Alert and oriented. Well appearing and in no acute distress. Eyes: Conjunctivae are normal. PERRL. EOMI. Head: Atraumatic. Nose: No congestion/rhinnorhea. Mouth/Throat: Mucous membranes are moist.  Oropharynx non-erythematous. Neck: No stridor.   Cardiovascular: Normal rate, regular rhythm. Grossly normal heart sounds.  Good peripheral circulation. Respiratory: Normal respiratory effort.  No retractions. Lungs CTAB. Gastrointestinal: Soft and with moderate nonfocal abdominal pain. She reports moderate tenderness in all quadrants, no rebound or guarding. Exam does not seem to demonstrate any clear focality. Negative Murphy, negative Rovsing.. No distention. No abdominal bruits. No CVA tenderness. Musculoskeletal: No lower extremity tenderness nor edema.  No joint effusions. Neurologic:  Normal speech and language. No gross focal neurologic deficits are appreciated. No gait instability. Skin:  Skin is warm, dry and intact. No rash noted. Psychiatric: Mood and affect are normal. Speech and behavior are normal.  ____________________________________________   LABS (all labs ordered are listed, but only abnormal results are displayed)  Labs Reviewed  CBC - Abnormal; Notable for the following:    WBC 11.1 (*)    All other components within normal  limits  COMPREHENSIVE METABOLIC PANEL - Abnormal; Notable for the following:    Glucose, Bld 101 (*)    Total Protein 9.6 (*)    ALT 13 (*)    All other components within normal limits  CHLAMYDIA/NGC RT PCR ([facility] ONLY)  LIPASE, BLOOD  URINALYSIS COMPLETEWITH MICROSCOPIC ([facility] ONLY)  POC URINE PREG, ED   ____________________________________________  EKG   ____________________________________________  RADIOLOGY  Discussed the risks and benefits of CT with the patient after her enema provided mild relief but still having nonspecific and somewhat nonfocal abdominal pain. She does report a history of "Crohn's" in her father, she does report moderate ongoing abdominal pain. She overall  feels somewhat better, on reexam she does have nonfocal abdominal pain that appears to be in all quadrants without rebound or guarding. After discussing the risks and benefits of CT including the risks of radiation and IV contrast patient elected that she would like to have a CT scan to further evaluate the cause or abdominal pain. Given the complaints of increasing discomfort and pain over the last 30 days, I do not think this is unreasonable. I've ordered a CT abdomen and pelvis which is pending at this time. ____________________________________________   PROCEDURES  Procedure(s) performed: None  Critical Care performed: No  ____________________________________________   INITIAL IMPRESSION / ASSESSMENT AND PLAN / ED COURSE  Pertinent labs & imaging results that were available during my care of the patient were reviewed by me and considered in my medical decision making (see chart for details).  Patient presents for what this on the history she is convincingly a long-term history of constipation. She reports her primary care doctors placed her multiple medications in the past, all with. Little or no success. She currently reports nausea, but no evidence of vomiting. She has mild nonfocal  tenderness on exam, is afebrile and in no distress. We will obtain an x-ray to evaluate for signs of severe constipation, and based on the patient's symptomatology ordered an enema. Plan to follow her clinically, she has a bowel movement after enema will related examine. Presently nothing to indicate a clear need for further CT imaging.  ----------------------------------------- 10:46 PM on 02/14/2015 -----------------------------------------  Patient continues to endorse and does have mild nonfocal abdominal pain. We discussed and she does not have any pelvic pain or vaginal symptoms, however she is sexually active. I will send a urine gonorrhea chlamydia via dirty sample as well as add on urinalysis at this time, though I have very low suspicion for acute GYN process. We'll proceed with CT to further evaluate for cause of her increasing abdominal pain over the last month. Ongoing care and disposition assigned to my colleague on the beside. Plan to follow-up CT, urinalysis. If negative, likely discharge to home with a short prescription for Zofran, hydrocodone, (and advise miralax) which I have printed. ____________________________________________   FINAL CLINICAL IMPRESSION(S) / ED DIAGNOSES  Final diagnoses:  Generalized abdominal pain  Slow transit constipation      Sharyn CreamerMark Kamir Selover, MD 02/14/15 2252

## 2015-02-14 NOTE — ED Notes (Signed)
CT called, report infiltration found when pt taken to CT, IV pulled and on restarted in R AC.

## 2015-02-14 NOTE — ED Notes (Signed)
Patient transported to X-ray 

## 2015-02-14 NOTE — ED Notes (Signed)
Pt is still not able to have a bowl movement about 1 hour after enema was given.

## 2015-02-14 NOTE — ED Notes (Signed)
States has had no bm past month, some abd pain

## 2015-02-14 NOTE — ED Notes (Signed)
Pt assisted to the toilet, but states unsuccessful in having a BM

## 2015-02-14 NOTE — ED Notes (Signed)
Pt unable to void enema at this time.

## 2015-02-15 LAB — CHLAMYDIA/NGC RT PCR (ARMC ONLY)
Chlamydia Tr: NOT DETECTED
N gonorrhoeae: NOT DETECTED

## 2015-02-15 MED ORDER — DOCUSATE SODIUM 100 MG PO CAPS: 100.0000 mg | ORAL_CAPSULE | Freq: Two times a day (BID) | ORAL | Status: AC

## 2015-02-15 MED ORDER — POLYETHYLENE GLYCOL 3350 17 GM/SCOOP PO POWD: 17.0000 g | Freq: Every day | ORAL | Status: DC

## 2015-02-15 NOTE — ED Provider Notes (Signed)
-----------------------------------------   12:03 AM on 02/15/2015 -----------------------------------------  CT shows stool and liquid within the colon, otherwise normal. I performed a rectal exam on the patient, there is soft stool in the colon, no signs of fecal impaction. We will discharge the patient home on MiraLAX, Colace, and GI follow-up. Patient is agreeable to this plan. We'll also discharge with Zofran and Norco per Dr. Lorenza ChickQuale's discharge instructions.  Minna AntisKevin Kaelan Emami, MD 02/15/15 [phone]

## 2015-02-15 NOTE — ED Notes (Addendum)
MD at bedside for rectal exam, accompanied by this RN

## 2015-02-15 NOTE — Discharge Instructions (Signed)
You were seen in the emergency room for abdominal pain. It is important that you follow up closely with your primary care doctor in the next couple of days.  If you're unable to see her primary care doctor you may return to the emergency room or go to the [facility] in 2 or 3 days for reexam.  Please return to the emergency room right away if you are to develop a fever, severe nausea, your pain becomes severe or worsens, you are unable to keep food down, begin vomiting any dark or bloody fluid, you develop any dark or bloody stools, feel dehydrated, or other new concerns or symptoms arise.   Abdominal Pain, Adult Many things can cause abdominal pain. Usually, abdominal pain is not caused by a disease and will improve without treatment. It can often be observed and treated at home. Your health care provider will do a physical exam and possibly order blood tests and X-rays to help determine the seriousness of your pain. However, in many cases, more time must pass before a clear cause of the pain can be found. Before that point, your health care provider may not know if you need more testing or further treatment. HOME CARE INSTRUCTIONS Monitor your abdominal pain for any changes. The following actions may help to alleviate any discomfort you are experiencing:  Only take over-the-counter or prescription medicines as directed by your health care provider.  Do not take laxatives unless directed to do so by your health care provider.  Try a clear liquid diet (broth, tea, or water) as directed by your health care provider. Slowly move to a bland diet as tolerated. SEEK MEDICAL CARE IF:  You have unexplained abdominal pain.  You have abdominal pain associated with nausea or diarrhea.  You have pain when you urinate or have a bowel movement.  You experience abdominal pain that wakes you in the night.  You have abdominal pain that is worsened or improved by eating food.  You have  abdominal pain that is worsened with eating fatty foods.  You have a fever. SEEK IMMEDIATE MEDICAL CARE IF:  Your pain does not go away within 2 hours.  You keep throwing up (vomiting).  Your pain is felt only in portions of the abdomen, such as the right side or the left lower portion of the abdomen.  You pass bloody or black tarry stools. MAKE SURE YOU:  Understand these instructions.  Will watch your condition.  Will get help right away if you are not doing well or get worse.   This information is not intended to replace advice given to you by your health care provider. Make sure you discuss any questions you have with your health care provider.   Document Released: 12/10/2004 Document Revised: 11/21/2014 Document Reviewed: 11/09/2012 Elsevier Interactive Patient Education 2016 [employer].  Constipation, Adult Constipation is when a person has fewer than three bowel movements a week, has difficulty having a bowel movement, or has stools that are dry, hard, or larger than normal. As people grow older, constipation is more common. A low-fiber diet, not taking in enough fluids, and taking certain medicines may make constipation worse.  CAUSES   Certain medicines, such as antidepressants, pain medicine, iron supplements, antacids, and water pills.   Certain diseases, such as diabetes, irritable bowel syndrome (IBS), thyroid disease, or depression.   Not drinking enough water.   Not eating enough fiber-rich foods.   Stress or travel.   Lack of physical activity  or exercise.   Ignoring the urge to have a bowel movement.   Using laxatives too much.  SIGNS AND SYMPTOMS   Having fewer than three bowel movements a week.   Straining to have a bowel movement.   Having stools that are hard, dry, or larger than normal.   Feeling full or bloated.   Pain in the lower abdomen.   Not feeling relief after having a bowel movement.  DIAGNOSIS  Your health care  provider will take a medical history and perform a physical exam. Further testing may be done for severe constipation. Some tests may include:  A barium enema X-ray to examine your rectum, colon, and, sometimes, your small intestine.   A sigmoidoscopy to examine your lower colon.   A colonoscopy to examine your entire colon. TREATMENT  Treatment will depend on the severity of your constipation and what is causing it. Some dietary treatments include drinking more fluids and eating more fiber-rich foods. Lifestyle treatments may include regular exercise. If these diet and lifestyle recommendations do not help, your health care provider may recommend taking over-the-counter laxative medicines to help you have bowel movements. Prescription medicines may be prescribed if over-the-counter medicines do not work.  HOME CARE INSTRUCTIONS   Eat foods that have a lot of fiber, such as fruits, vegetables, whole grains, and beans.  Limit foods high in fat and processed sugars, such as french fries, hamburgers, cookies, candies, and soda.   A fiber supplement may be added to your diet if you cannot get enough fiber from foods.   Drink enough fluids to keep your urine clear or pale yellow.   Exercise regularly or as directed by your health care provider.   Go to the restroom when you have the urge to go. Do not hold it.   Only take over-the-counter or prescription medicines as directed by your health care provider. Do not take other medicines for constipation without talking to your health care provider first.  SEEK IMMEDIATE MEDICAL CARE IF:   You have bright red blood in your stool.   Your constipation lasts for more than 4 days or gets worse.   You have abdominal or rectal pain.   You have thin, pencil-like stools.   You have unexplained weight loss. MAKE SURE YOU:   Understand these instructions.  Will watch your condition.  Will get help right away if you are not doing well  or get worse.   This information is not intended to replace advice given to you by your health care provider. Make sure you discuss any questions you have with your health care provider.   Document Released: 11/29/2003 Document Revised: 03/23/2014 Document Reviewed: 12/12/2012 Elsevier Interactive Patient Education 2016 [employer]  Constipation, Adult Constipation is when a person:  Poops (has a bowel movement) less than 3 times a week.  Has a hard time pooping.  Has poop that is dry, hard, or bigger than normal. HOME CARE   Eat foods with a lot of fiber in them. This includes fruits, vegetables, beans, and whole grains such as brown rice.  Avoid fatty foods and foods with a lot of sugar. This includes french fries, hamburgers, cookies, candy, and soda.  If you are not getting enough fiber from food, take products with added fiber in them (supplements).  Drink enough fluid to keep your pee (urine) clear or pale yellow.  Exercise on a regular basis, or as told by your doctor.  Go to the restroom when you  feel like you need to poop. Do not hold it.  Only take medicine as told by your doctor. Do not take medicines that help you poop (laxatives) without talking to your doctor first. GET HELP RIGHT AWAY IF:   You have bright red blood in your poop (stool).  Your constipation lasts more than 4 days or gets worse.  You have belly (abdominal) or butt (rectal) pain.  You have thin poop (as thin as a pencil).  You lose weight, and it cannot be explained. MAKE SURE YOU:   Understand these instructions.  Will watch your condition.  Will get help right away if you are not doing well or get worse.   This information is not intended to replace advice given to you by your health care provider. Make sure you discuss any questions you have with your health care provider.   Document Released: 08/19/2007 Document Revised: 03/23/2014 Document Reviewed: 12/12/2012 Elsevier  Interactive Patient Education [employer]

## 2015-02-27 ENCOUNTER — Encounter: Payer: Self-pay | Admitting: Emergency Medicine

## 2015-02-27 ENCOUNTER — Emergency Department
Admission: EM | Admit: 2015-02-27 | Discharge: 2015-02-27 | Disposition: A | Discharge status: Home / Self Care | Payer: Medicaid Other | Attending: Emergency Medicine | Admitting: Emergency Medicine

## 2015-02-27 ENCOUNTER — Emergency Department: Payer: Medicaid Other

## 2015-02-27 DIAGNOSIS — R109 Unspecified abdominal pain: Secondary | ICD-10-CM | POA: Diagnosis present

## 2015-02-27 DIAGNOSIS — Z87891 Personal history of nicotine dependence: Secondary | ICD-10-CM | POA: Diagnosis not present

## 2015-02-27 DIAGNOSIS — R45851 Suicidal ideations: Secondary | ICD-10-CM | POA: Diagnosis not present

## 2015-02-27 DIAGNOSIS — K5900 Constipation, unspecified: Secondary | ICD-10-CM | POA: Diagnosis not present

## 2015-02-27 DIAGNOSIS — R1084 Generalized abdominal pain: Secondary | ICD-10-CM | POA: Diagnosis not present

## 2015-02-27 DIAGNOSIS — Z3202 Encounter for pregnancy test, result negative: Secondary | ICD-10-CM | POA: Diagnosis not present

## 2015-02-27 DIAGNOSIS — Z880 Allergy status to penicillin: Secondary | ICD-10-CM | POA: Diagnosis not present

## 2015-02-27 LAB — COMPREHENSIVE METABOLIC PANEL
ALT: 10 U/L — AB (ref 14–54)
AST: 16 U/L (ref 15–41)
Albumin: 3.7 g/dL (ref 3.5–5.0)
Alkaline Phosphatase: 78 U/L (ref 38–126)
Anion gap: 8 (ref 5–15)
BUN: 9 mg/dL (ref 6–20)
CHLORIDE: 105 mmol/L (ref 101–111)
CO2: 25 mmol/L (ref 22–32)
CREATININE: 0.75 mg/dL (ref 0.44–1.00)
Calcium: 9.4 mg/dL (ref 8.9–10.3)
GFR calc Af Amer: >60 mL/min (ref >60)
GFR calc non Af Amer: >60 mL/min (ref >60)
GLUCOSE: 97 mg/dL (ref 65–99)
Potassium: 4.1 mmol/L (ref 3.5–5.1)
SODIUM: 138 mmol/L (ref 135–145)
Total Bilirubin: <0.1 mg/dL — ABNORMAL LOW (ref 0.3–1.2)
Total Protein: 7.6 g/dL (ref 6.5–8.1)

## 2015-02-27 LAB — URINE DRUG SCREEN, QUALITATIVE (ARMC ONLY)
Amphetamines, Ur Screen: NOT DETECTED
Barbiturates, Ur Screen: NOT DETECTED
Benzodiazepine, Ur Scrn: NOT DETECTED
CANNABINOID 50 NG, UR ~~LOC~~: NOT DETECTED
COCAINE METABOLITE, UR ~~LOC~~: NOT DETECTED
MDMA (ECSTASY) UR SCREEN: NOT DETECTED
Methadone Scn, Ur: NOT DETECTED
OPIATE, UR SCREEN: NOT DETECTED
PHENCYCLIDINE (PCP) UR S: NOT DETECTED
Tricyclic, Ur Screen: NOT DETECTED

## 2015-02-27 LAB — URINALYSIS COMPLETE WITH MICROSCOPIC (ARMC ONLY)
Bilirubin Urine: NEGATIVE
Glucose, UA: NEGATIVE mg/dL
Hgb urine dipstick: NEGATIVE
Ketones, ur: NEGATIVE mg/dL
NITRITE: NEGATIVE
PROTEIN: NEGATIVE mg/dL
SPECIFIC GRAVITY, URINE: 1.018 (ref 1.005–1.030)
pH: 7 (ref 5.0–8.0)

## 2015-02-27 LAB — CBC
HCT: 40.2 % (ref 35.0–47.0)
HEMOGLOBIN: 13.2 g/dL (ref 12.0–16.0)
MCH: 26.9 pg (ref 26.0–34.0)
MCHC: 32.7 g/dL (ref 32.0–36.0)
MCV: 82.1 fL (ref 80.0–100.0)
Platelets: 338 10*3/uL (ref 150–440)
RBC: 4.9 MIL/uL (ref 3.80–5.20)
RDW: 15.1 % — ABNORMAL HIGH (ref 11.5–14.5)
WBC: 5.9 10*3/uL (ref 3.6–11.0)

## 2015-02-27 LAB — SALICYLATE LEVEL: Salicylate Lvl: <4 mg/dL (ref 2.8–30.0)

## 2015-02-27 LAB — ACETAMINOPHEN LEVEL: Acetaminophen (Tylenol), Serum: <10 ug/mL — ABNORMAL LOW (ref 10–30)

## 2015-02-27 LAB — ETHANOL: Alcohol, Ethyl (B): <5 mg/dL (ref <5)

## 2015-02-27 MED ORDER — PEG 3350-KCL-NA BICARB-NACL 420 G PO SOLR: 4000.0000 mL | Freq: Once | ORAL | Status: DC

## 2015-02-27 NOTE — ED Notes (Signed)
Negative pregnancy test reported to me by Florentina Addisonkatie EDT

## 2015-02-27 NOTE — ED Provider Notes (Addendum)
[facility] Emergency Department Provider Note     Time seen: ----------------------------------------- 12:47 PM on 02/27/2015 -----------------------------------------    I have reviewed the triage vital signs and the nursing notes.   HISTORY  Chief Complaint Constipation    HPI [name] is a 19 y.o. female who presents to ER for Friday of complaints. Patient states she is having suicidal thoughts as well as she is constipated. Patient was soliciting the ER for constipation was given MiraLAX and stool softener. She states she has not had any bowel movements. She is feeling suicidal because her nanny has told her son and gone to washed and stated she states.   Past Medical History  Diagnosis Date  . Asthma   . Anemia   . Anxiety   . Anemia   . Chronic back pain     Patient Active Problem List   Diagnosis Date Noted  . Anemia 01/16/2015  . Dysmenorrhea 01/16/2015  . Dyspareunia in female 01/16/2015  . Abnormal uterine bleeding 01/16/2015    Past Surgical History  Procedure Laterality Date  . Wrist sugery    . Bunionectomy Bilateral     feet    Allergies Penicillins and Rocephin  Social History Social History  Substance Use Topics  . Smoking status: Former Games developermoker  . Smokeless tobacco: Never Used  . Alcohol Use: No    Review of Systems Constitutional: Negative for fever. Eyes: Negative for visual changes. ENT: Negative for sore throat. Cardiovascular: Negative for chest pain. Respiratory: Negative for shortness of breath. Gastrointestinal: Positive for abdominal pain and constipation Genitourinary: Negative for dysuria. Musculoskeletal: Negative for back pain. Skin: Negative for rash. Neurological: Negative for headaches, focal weakness or numbness. Psychiatric: Positive for suicidal ideation  10-point ROS otherwise negative.  ____________________________________________   PHYSICAL EXAM:  VITAL SIGNS: ED Triage  Vitals  Enc Vitals Group     BP 02/27/15 1115 143/76 mmHg     Pulse Rate 02/27/15 1115 104     Resp 02/27/15 1115 18     Temp 02/27/15 1115 98.1 F (36.7 C)     Temp Source 02/27/15 1115 Oral     SpO2 02/27/15 1115 100 %     Weight 02/27/15 1115 140 lb (63.504 kg)     Height 02/27/15 1115 5\' 2"  (1.575 m)     Head Cir --      Peak Flow --      Pain Score 02/27/15 1115 8     Pain Loc --      Pain Edu? --      Excl. in GC? --     Constitutional: Alert and oriented. Well appearing and in no distress. Eyes: Conjunctivae are normal. PERRL. Normal extraocular movements. ENT   Head: Normocephalic and atraumatic.   Nose: No congestion/rhinnorhea.   Mouth/Throat: Mucous membranes are moist.   Neck: No stridor. Cardiovascular: Normal rate, regular rhythm. Normal and symmetric distal pulses are present in all extremities. No murmurs, rubs, or gallops. Respiratory: Normal respiratory effort without tachypnea nor retractions. Breath sounds are clear and equal bilaterally. No wheezes/rales/rhonchi. Gastrointestinal: Soft and nontender. No distention. No abdominal bruits.  Musculoskeletal: Nontender with normal range of motion in all extremities. No joint effusions.  No lower extremity tenderness nor edema. Neurologic:  Normal speech and language. No gross focal neurologic deficits are appreciated. Speech is normal. No gait instability. Skin:  Skin is warm, dry and intact. No rash noted. Psychiatric: Mood and affect are normal. Speech and behavior are  normal. Patient exhibits appropriate insight and judgment. ____________________________________________  ED COURSE:  Pertinent labs & imaging results that were available during my care of the patient were reviewed by me and considered in my medical decision making (see chart for details). Patient is no acute distress, will check basic labs and likely KUB. It is unclear whether she needs to see a psychiatrist or not.   ____________________________________________    LABS (pertinent positives/negatives)  Labs Reviewed  COMPREHENSIVE METABOLIC PANEL - Abnormal; Notable for the following:    ALT 10 (*)    Total Bilirubin <0.1 (*)    All other components within normal limits  ACETAMINOPHEN LEVEL - Abnormal; Notable for the following:    Acetaminophen (Tylenol), Serum <10 (*)    All other components within normal limits  CBC - Abnormal; Notable for the following:    RDW 15.1 (*)    All other components within normal limits  URINALYSIS COMPLETEWITH MICROSCOPIC ([facility] ONLY) - Abnormal; Notable for the following:    Color, Urine YELLOW (*)    APPearance CLEAR (*)    Leukocytes, UA 1+ (*)    Bacteria, UA RARE (*)    Squamous Epithelial / LPF 0-5 (*)    All other components within normal limits  ETHANOL  SALICYLATE LEVEL  URINE DRUG SCREEN, QUALITATIVE ([facility] ONLY)  PREGNANCY, URINE  POC URINE PREG, ED    RADIOLOGY Images were viewed by me  KUB IMPRESSION: Moderate colonic stool burden without evidence of enteric obstruction. ____________________________________________  FINAL ASSESSMENT AND PLAN  Abdominal pain, suicidal feelings  Plan: Patient with labs and imaging as dictated above. Patient is advised to used TriLyte for her symptoms. She is not felt to be a threat to herself or to anyone else. She is encouraged to go find her son if someone really took him. This is a situation or not.   Emily Filbert, MD   Emily Filbert, MD 02/27/15 1345  Emily Filbert, MD 02/27/15 [phone]

## 2015-02-27 NOTE — ED Notes (Signed)
Pt has not had bowel movement.  Was seen here earlier this month and was given miralax and stool softener.  Did not go from tx in ED. Has also tried OTC suppository and mag citrate OTC but has not had bowel movement. C/o abdominal and lower back pain.

## 2015-02-27 NOTE — ED Notes (Signed)
BEHAVIORAL HEALTH ROUNDING Patient sleeping: No. Patient alert and oriented: yes Behavior appropriate: Yes.  ; If no, describe:  Nutrition and fluids offered: yes Toileting and hygiene offered: Yes  Sitter present: q15 minute observations and security camera monitoring Law enforcement present: Yes  ODS  

## 2015-02-27 NOTE — Discharge Instructions (Signed)

## 2015-02-27 NOTE — ED Notes (Signed)

## 2015-04-12 ENCOUNTER — Emergency Department
Admission: EM | Admit: 2015-04-12 | Discharge: 2015-04-12 | Disposition: A | Discharge status: Home / Self Care | Payer: Medicaid Other | Attending: Emergency Medicine | Admitting: Emergency Medicine

## 2015-04-12 ENCOUNTER — Emergency Department: Payer: Medicaid Other

## 2015-04-12 ENCOUNTER — Encounter: Payer: Self-pay | Admitting: *Deleted

## 2015-04-12 DIAGNOSIS — R0789 Other chest pain: Secondary | ICD-10-CM | POA: Diagnosis not present

## 2015-04-12 DIAGNOSIS — Z880 Allergy status to penicillin: Secondary | ICD-10-CM | POA: Diagnosis not present

## 2015-04-12 DIAGNOSIS — M549 Dorsalgia, unspecified: Secondary | ICD-10-CM | POA: Insufficient documentation

## 2015-04-12 DIAGNOSIS — R079 Chest pain, unspecified: Secondary | ICD-10-CM | POA: Diagnosis present

## 2015-04-12 DIAGNOSIS — Z87891 Personal history of nicotine dependence: Secondary | ICD-10-CM | POA: Diagnosis not present

## 2015-04-12 DIAGNOSIS — M25511 Pain in right shoulder: Secondary | ICD-10-CM | POA: Insufficient documentation

## 2015-04-12 DIAGNOSIS — M25512 Pain in left shoulder: Secondary | ICD-10-CM | POA: Diagnosis not present

## 2015-04-12 DIAGNOSIS — M7918 Myalgia, other site: Secondary | ICD-10-CM

## 2015-04-12 LAB — BASIC METABOLIC PANEL
ANION GAP: 8 (ref 5–15)
BUN: 7 mg/dL (ref 6–20)
CALCIUM: 9.3 mg/dL (ref 8.9–10.3)
CHLORIDE: 105 mmol/L (ref 101–111)
CO2: 25 mmol/L (ref 22–32)
Creatinine, Ser: 0.78 mg/dL (ref 0.44–1.00)
GFR calc non Af Amer: >60 mL/min (ref >60)
Glucose, Bld: 101 mg/dL — ABNORMAL HIGH (ref 65–99)
POTASSIUM: 3.8 mmol/L (ref 3.5–5.1)
Sodium: 138 mmol/L (ref 135–145)

## 2015-04-12 LAB — CBC
HCT: 39.4 % (ref 35.0–47.0)
HEMOGLOBIN: 12.8 g/dL (ref 12.0–16.0)
MCH: 27.2 pg (ref 26.0–34.0)
MCHC: 32.5 g/dL (ref 32.0–36.0)
MCV: 83.9 fL (ref 80.0–100.0)
Platelets: 284 10*3/uL (ref 150–440)
RBC: 4.7 MIL/uL (ref 3.80–5.20)
RDW: 16.9 % — ABNORMAL HIGH (ref 11.5–14.5)
WBC: 7.3 10*3/uL (ref 3.6–11.0)

## 2015-04-12 LAB — TROPONIN I

## 2015-04-12 MED ORDER — DIAZEPAM 5 MG PO TABS: 5.0000 mg | ORAL_TABLET | Freq: Three times a day (TID) | ORAL | Status: DC | PRN

## 2015-04-12 MED ORDER — IBUPROFEN 800 MG PO TABS: 800.0000 mg | ORAL_TABLET | Freq: Three times a day (TID) | ORAL | Status: DC | PRN

## 2015-04-12 NOTE — ED Notes (Signed)
Pt ambulatory to room by self with no concerns.

## 2015-04-12 NOTE — ED Notes (Signed)
Pt reports chest pain/ back pain for the last 4 days

## 2015-04-12 NOTE — ED Provider Notes (Signed)
[facility] Emergency Department Provider Note     Time seen: ----------------------------------------- 5:12 PM on 04/12/2015 -----------------------------------------    I have reviewed the triage vital signs and the nursing notes.   HISTORY  Chief Complaint Chest Pain    HPI Kristy Reid is a 20 y.o. female who presents ER with chest and back pain for last 4 days. Movement makes it worse, nothing makes it better. Patient never had this before, describes as a sharp type pain. Patient denies any recent physical activity but states she does carry her "fat" baby around all the time.   Past Medical History  Diagnosis Date  . Asthma   . Anemia   . Anxiety   . Anemia   . Chronic back pain     Patient Active Problem List   Diagnosis Date Noted  . Anemia 01/16/2015  . Dysmenorrhea 01/16/2015  . Dyspareunia in female 01/16/2015  . Abnormal uterine bleeding 01/16/2015    Past Surgical History  Procedure Laterality Date  . Wrist sugery    . Bunionectomy Bilateral     feet    Allergies Penicillins and Rocephin  Social History Social History  Substance Use Topics  . Smoking status: Former Games developer  . Smokeless tobacco: Never Used  . Alcohol Use: No    Review of Systems Constitutional: Negative for fever. Eyes: Negative for visual changes. ENT: Negative for sore throat. Cardiovascular: Positive for chest pain Respiratory: Negative for shortness of breath. Gastrointestinal: Negative for abdominal pain, vomiting and diarrhea. Genitourinary: Negative for dysuria. Musculoskeletal: Positive for back pain Skin: Negative for rash. Neurological: Negative for headaches, focal weakness or numbness.  10-point ROS otherwise negative.  ____________________________________________   PHYSICAL EXAM:  VITAL SIGNS: ED Triage Vitals  Enc Vitals Group     BP 04/12/15 1545 113/61 mmHg     Pulse Rate 04/12/15 1545 110     Resp 04/12/15 1545 18      Temp 04/12/15 1545 99.2 F (37.3 C)     Temp Source 04/12/15 1545 Oral     SpO2 04/12/15 1545 98 %     Weight 04/12/15 1545 135 lb (61.236 kg)     Height 04/12/15 1545  (1.575 m)     Head Cir --      Peak Flow --      Pain Score 04/12/15 1702 9     Pain Loc --      Pain Edu? --      Excl. in GC? --     Constitutional: Alert and oriented. Well appearing and in no distress. Eyes: Conjunctivae are normal. PERRL. Normal extraocular movements. ENT   Head: Normocephalic and atraumatic.   Nose: No congestion/rhinnorhea.   Mouth/Throat: Mucous membranes are moist.   Neck: No stridor. Cardiovascular: Normal rate, regular rhythm. Normal and symmetric distal pulses are present in all extremities. No murmurs, rubs, or gallops. Respiratory: Normal respiratory effort without tachypnea nor retractions. Breath sounds are clear and equal bilaterally. No wheezes/rales/rhonchi. Gastrointestinal: Soft and nontender. No distention. No abdominal bruits.  Musculoskeletal: Bilateral trapezius muscle tenderness, parasternal chest wall tenderness on the left Neurologic:  Normal speech and language. No gross focal neurologic deficits are appreciated. Speech is normal. No gait instability. Skin:  Skin is warm, dry and intact. No rash noted. Psychiatric: Mood and affect are normal. Speech and behavior are normal. Patient exhibits appropriate insight and judgment. ____________________________________________  EKG: Interpreted by me. Sinus tachycardia with a rate of 110 bpm, normal PR interval,  normal QRS, normal QT interval. Normal axis.  ____________________________________________  ED COURSE:  Pertinent labs & imaging results that were available during my care of the patient were reviewed by me and considered in my medical decision making (see chart for details). Patient's symptoms consistent with musculoskeletal pain. Check basic labs and  reevaluate. ____________________________________________    LABS (pertinent positives/negatives)  Labs Reviewed  BASIC METABOLIC PANEL - Abnormal; Notable for the following:    Glucose, Bld 101 (*)    All other components within normal limits  CBC - Abnormal; Notable for the following:    RDW 16.9 (*)    All other components within normal limits  TROPONIN I    RADIOLOGY Images were viewed by me  IMPRESSION: No active cardiopulmonary disease.  ____________________________________________  FINAL ASSESSMENT AND PLAN  Musculoskeletal pain, chest pain  Plan: Patient with labs and imaging as dictated above. Patient with 1.3% risk of PE according to [facility] criteria. She has no shortness of breath and symptoms are consistent with musculoskeletal pain. She'll be discharged with Motrin and Valium.   Emily Filbert, MD   Emily Filbert, MD 04/12/15 [phone]

## 2015-04-12 NOTE — Discharge Instructions (Signed)
Chest Wall Pain °Chest wall pain is pain in or around the bones and muscles of your chest. Sometimes, an injury causes this pain. Sometimes, the cause may not be known. This pain may take several weeks or longer to get better. °HOME CARE INSTRUCTIONS  °Pay attention to any changes in your symptoms. Take these actions to help with your pain:  °· Rest as told by your health care provider.   °· Avoid activities that cause pain. These include any activities that use your chest muscles or your abdominal and side muscles to lift heavy items.    °· If directed, apply ice to the painful area: °¨ Put ice in a plastic bag. °¨ Place a towel between your skin and the bag. °¨ Leave the ice on for 20 minutes, 2-3 times per day. °· Take over-the-counter and prescription medicines only as told by your health care provider. °· Do not use tobacco products, including cigarettes, chewing tobacco, and e-cigarettes. If you need help quitting, ask your health care provider. °· Keep all follow-up visits as told by your health care provider. This is important. °SEEK MEDICAL CARE IF: °· You have a fever. °· Your chest pain becomes worse. °· You have new symptoms. °SEEK IMMEDIATE MEDICAL CARE IF: °· You have nausea or vomiting. °· You feel sweaty or light-headed. °· You have a cough with phlegm (sputum) or you cough up blood. °· You develop shortness of breath. °  °This information is not intended to replace advice given to you by your health care provider. Make sure you discuss any questions you have with your health care provider. °  °Document Released: 03/02/2005 Document Revised: 11/21/2014 Document Reviewed: 05/28/2014 °Elsevier Interactive Patient Education ©2016 [employer] ° °Musculoskeletal Pain °Musculoskeletal pain is muscle and boney aches and pains. These pains can occur in any part of the body. Your caregiver may treat you without knowing the cause of the pain. They may treat you if blood or urine tests, X-rays, and other  tests were normal.  °CAUSES °There is often not a definite cause or reason for these pains. These pains may be caused by a type of germ (virus). The discomfort may also come from overuse. Overuse includes working out too hard when your body is not fit. Boney aches also come from weather changes. Bone is sensitive to atmospheric pressure changes. °HOME CARE INSTRUCTIONS  °· Ask when your test results will be ready. Make sure you get your test results. °· Only take over-the-counter or prescription medicines for pain, discomfort, or fever as directed by your caregiver. If you were given medications for your condition, do not drive, operate machinery or power tools, or sign legal documents for 24 hours. Do not drink alcohol. Do not take sleeping pills or other medications that may interfere with treatment. °· Continue all activities unless the activities cause more pain. When the pain lessens, slowly resume normal activities. Gradually increase the intensity and duration of the activities or exercise. °· During periods of severe pain, bed rest may be helpful. Lay or sit in any position that is comfortable. °· Putting ice on the injured area. °¨ Put ice in a bag. °¨ Place a towel between your skin and the bag. °¨ Leave the ice on for 15 to 20 minutes, 3 to 4 times a day. °· Follow up with your caregiver for continued problems and no reason can be found for the pain. If the pain becomes worse or does not go away, it may be necessary to repeat   tests or do additional testing. Your caregiver may need to look further for a possible cause. °SEEK IMMEDIATE MEDICAL CARE IF: °· You have pain that is getting worse and is not relieved by medications. °· You develop chest pain that is associated with shortness or breath, sweating, feeling sick to your stomach (nauseous), or throw up (vomit). °· Your pain becomes localized to the abdomen. °· You develop any new symptoms that seem different or that concern you. °MAKE SURE YOU:   °· Understand these instructions. °· Will watch your condition. °· Will get help right away if you are not doing well or get worse. °  °This information is not intended to replace advice given to you by your health care provider. Make sure you discuss any questions you have with your health care provider. °  °Document Released: 03/02/2005 Document Revised: 05/25/2011 Document Reviewed: 11/04/2012 °Elsevier Interactive Patient Education ©2016 [employer]. ° °

## 2015-04-17 ENCOUNTER — Emergency Department
Admission: EM | Admit: 2015-04-17 | Discharge: 2015-04-17 | Disposition: A | Discharge status: Home / Self Care | Payer: Medicaid Other | Attending: Emergency Medicine | Admitting: Emergency Medicine

## 2015-04-17 ENCOUNTER — Encounter: Payer: Self-pay | Admitting: Emergency Medicine

## 2015-04-17 DIAGNOSIS — R000 Tachycardia, unspecified: Secondary | ICD-10-CM | POA: Diagnosis not present

## 2015-04-17 DIAGNOSIS — R079 Chest pain, unspecified: Secondary | ICD-10-CM | POA: Diagnosis present

## 2015-04-17 DIAGNOSIS — Z79899 Other long term (current) drug therapy: Secondary | ICD-10-CM | POA: Diagnosis not present

## 2015-04-17 DIAGNOSIS — R0789 Other chest pain: Secondary | ICD-10-CM | POA: Insufficient documentation

## 2015-04-17 DIAGNOSIS — Z791 Long term (current) use of non-steroidal anti-inflammatories (NSAID): Secondary | ICD-10-CM | POA: Diagnosis not present

## 2015-04-17 DIAGNOSIS — Z880 Allergy status to penicillin: Secondary | ICD-10-CM | POA: Diagnosis not present

## 2015-04-17 DIAGNOSIS — Z3202 Encounter for pregnancy test, result negative: Secondary | ICD-10-CM | POA: Insufficient documentation

## 2015-04-17 DIAGNOSIS — Z8659 Personal history of other mental and behavioral disorders: Secondary | ICD-10-CM | POA: Diagnosis not present

## 2015-04-17 DIAGNOSIS — Z87891 Personal history of nicotine dependence: Secondary | ICD-10-CM | POA: Insufficient documentation

## 2015-04-17 LAB — POCT PREGNANCY, URINE: Preg Test, Ur: NEGATIVE

## 2015-04-17 LAB — FIBRIN DERIVATIVES D-DIMER (ARMC ONLY): FIBRIN DERIVATIVES D-DIMER (ARMC): 139 (ref 0–499)

## 2015-04-17 MED ORDER — KETOROLAC TROMETHAMINE 30 MG/ML IJ SOLN
30.0000 mg | Freq: Once | INTRAMUSCULAR | Status: AC
  Administered 2015-04-17: 30 mg via INTRAMUSCULAR
  Filled 2015-04-17: qty 1

## 2015-04-17 NOTE — Discharge Instructions (Signed)

## 2015-04-17 NOTE — ED Provider Notes (Signed)
[facility] Emergency Department Provider Note  ____________________________________________    I have reviewed the triage vital signs and the nursing notes.   HISTORY  Chief Complaint Chest Pain    HPI Kristy Reid is a 20 y.o. female who presents with complaints of right-sided chest "aching" that is worse with deep breaths and moving her right arm and lying on her right side. She denies shortness of breath. She does have history of asthma. She was seen recently in the emergency department for similar complaints. She was given a prescription for Valium but reports it has not helped significantly. She denies fevers chills. No cough. No recent travel. No calf pain. No history of DVT or PE. She reports she is not on birth control     Past Medical History  Diagnosis Date  . Asthma   . Anemia   . Anxiety   . Anemia   . Chronic back pain     Patient Active Problem List   Diagnosis Date Noted  . Anemia 01/16/2015  . Dysmenorrhea 01/16/2015  . Dyspareunia in female 01/16/2015  . Abnormal uterine bleeding 01/16/2015    Past Surgical History  Procedure Laterality Date  . Wrist sugery    . Bunionectomy Bilateral     feet    Current Outpatient Rx  Name  Route  Sig  Dispense  Refill  . albuterol (PROVENTIL HFA;VENTOLIN HFA) 108 (90 BASE) MCG/ACT inhaler   Inhalation   Inhale 2 puffs into the lungs every 6 (six) hours as needed for wheezing or shortness of breath.   1 Inhaler   2   . cyclobenzaprine (FLEXERIL) 10 MG tablet   Oral   Take 1 tablet (10 mg total) by mouth 3 (three) times daily as needed for muscle spasms.   30 tablet   0   . diazepam (VALIUM) 5 MG tablet   Oral   Take 1 tablet (5 mg total) by mouth every 8 (eight) hours as needed for muscle spasms.   20 tablet   0   . docusate sodium (COLACE) 100 MG capsule   Oral   Take 1 capsule (100 mg total) by mouth 2 (two) times daily.   60 capsule   2   . etonogestrel-ethinyl  estradiol (NUVARING) 0.12-0.015 MG/24HR vaginal ring      Insert vaginally and leave in place for 3 consecutive weeks, then remove for 1 week.   1 each   12   . HYDROcodone-acetaminophen (NORCO/VICODIN) 5-325 MG tablet   Oral   Take 1 tablet by mouth every 6 (six) hours as needed for moderate pain.   8 tablet   0   . ibuprofen (ADVIL,MOTRIN) 800 MG tablet   Oral   Take 1 tablet (800 mg total) by mouth 3 (three) times daily.   30 tablet   1   . ibuprofen (ADVIL,MOTRIN) 800 MG tablet   Oral   Take 1 tablet (800 mg total) by mouth every 8 (eight) hours as needed.   30 tablet   0   . medroxyPROGESTERone (DEPO-PROVERA) 150 MG/ML injection   Intramuscular   Inject 150 mg into the muscle every 3 (three) months.         . meloxicam (MOBIC) 15 MG tablet   Oral   Take 1 tablet (15 mg total) by mouth daily.   30 tablet   2   . ondansetron (ZOFRAN ODT) 4 MG disintegrating tablet   Oral   Take 1 tablet (4 mg  total) by mouth every 6 (six) hours as needed for nausea or vomiting.   20 tablet   0   . oxyCODONE-acetaminophen (PERCOCET/ROXICET) 5-325 MG tablet   Oral   Take 1 tablet by mouth every 4 (four) hours as needed for severe pain. 1-2 tablets every 4-6 hours prn         . polyethylene glycol powder (GLYCOLAX/MIRALAX) powder   Oral   Take 17 g by mouth daily.   255 g   0   . polyethylene glycol-electrolytes (TRILYTE) 420 G solution   Oral   Take 4,000 mLs by mouth once.   4000 mL   0     Allergies Penicillins and Rocephin  Family History  Problem Relation Age of Onset  . Diabetes Mother   . Diabetes Maternal Grandmother   . Heart disease Maternal Grandmother   . Cancer Neg Hx   . Ovarian cancer Neg Hx     Social History Social History  Substance Use Topics  . Smoking status: Former Games developer  . Smokeless tobacco: Never Used  . Alcohol Use: No    Review of Systems  Constitutional: Negative for fever.  ENT: Negative for sore throat Cardiovascular:  Negative for chest pain. Respiratory: Negative for shortness of breath. No cough Gastrointestinal: Negative for abdominal pain, vomiting Genitourinary: Negative for dysuria. Musculoskeletal: Negative for back pain. Skin: Negative for rash. Neurological: Negative for headaches Psychiatric: Does report a history of anxiety    ____________________________________________   PHYSICAL EXAM:  VITAL SIGNS: ED Triage Vitals  Enc Vitals Group     BP 04/17/15 1433 135/75 mmHg     Pulse Rate 04/17/15 1433 106     Resp 04/17/15 1433 20     Temp 04/17/15 1433 98.7 F (37.1 C)     Temp Source 04/17/15 1433 Oral     SpO2 04/17/15 1433 100 %     Weight 04/17/15 1433 145 lb (65.772 kg)     Height 04/17/15 1433 5\' 2"  (1.575 m)     Head Cir --      Peak Flow --      Pain Score 04/17/15 1433 8     Pain Loc --      Pain Edu? --      Excl. in GC? --      Constitutional: Alert and oriented. Well appearing and in no distress. Eyes: Conjunctivae are normal.  ENT   Head: Normocephalic and atraumatic.   Mouth/Throat: Mucous membranes are moist. Cardiovascular: Mild tachycardia, regular rhythm. Normal and symmetric distal pulses are present in all extremities. No murmurs, rubs, or gallops. Right-sided pectoralis chest wall tenderness that is mild, is reproduced with extension of right arm Respiratory: Normal respiratory effort without tachypnea nor retractions. Breath sounds are clear and equal bilaterally.  Gastrointestinal: Soft and non-tender in all quadrants. No distention. There is no CVA tenderness. Genitourinary: deferred Musculoskeletal: Nontender with normal range of motion in all extremities. No lower extremity tenderness nor edema. Neurologic:  Normal speech and language.  Skin:  Skin is warm, dry and intact. No rash noted. Psychiatric: Mood and affect are normal. Patient exhibits appropriate insight and judgment.  ____________________________________________    LABS  (pertinent positives/negatives)  Labs Reviewed  FIBRIN DERIVATIVES D-DIMER ([facility] ONLY)  POCT PREGNANCY, URINE    ____________________________________________   EKG  ED ECG REPORT I, Jene Every, the attending physician, personally viewed and interpreted this ECG.  Date: 04/17/2015 EKG Time: 2:36 PM Rate: 103 Rhythm: normal sinus rhythm QRS Axis:  normal Intervals: normal ST/T Wave abnormalities: normal Conduction Disturbances: none Narrative Interpretation: unremarkable   ____________________________________________    RADIOLOGY I have personally reviewed any xrays that were ordered on this patient: X-ray reviewed from January 27 was normal  ____________________________________________   PROCEDURES  Procedure(s) performed: none  Critical Care performed: none  ____________________________________________   INITIAL IMPRESSION / ASSESSMENT AND PLAN / ED COURSE  Pertinent labs & imaging results that were available during my care of the patient were reviewed by me and considered in my medical decision making (see chart for details).  Patient presents with mild right-sided chest discomfort. She is quite anxious about this discomfort which may explain her tachycardia. She had a normal workup last week for the same discomfort. Given that she was tachycardic upon arrival I will send a d-dimer although her risk of PE is very very low. I do not feel that repeating other labs or chest x-ray is necessary given her otherwise normal EKG  D-dimer is within normal limits. Patient improved after toradol. Appropriate for discharge with outpatient followup. Tachycardia has improved after pain medication and rest.  ____________________________________________   FINAL CLINICAL IMPRESSION(S) / ED DIAGNOSES  Final diagnoses:  Acute chest wall pain     Jene Every, MD 04/17/15 1724

## 2015-04-17 NOTE — ED Provider Notes (Signed)
-----------------------------------------   5:08 PM on 04/17/2015 -----------------------------------------   Blood pressure 135/75, pulse 106, temperature 98.7 F (37.1 C), temperature source Oral, resp. rate 20, height  (1.575 m), weight 65.772 kg, last menstrual period 01/25/2015, SpO2 100 %.  Assuming care from Dr. Cyril Loosen.  In short, Kristy Reid is a 20 y.o. female with a chief complaint of Chest Pain .  Refer to the original H&P for additional details.   The current plan of care was to wait for the return of the d-dimer. If this returns positive patient would receive a CT scan of the chest to rule out pulmonary embolism. D-dimer returns negative at this time and patient will not be scanned. The patient's diagnosis is chest wall pain. The symptoms of tachycardia are likely secondary to anxiety. At this time it is felt that the patient should follow-up with primary care provider for further management of these symptoms. patient is informed of her diagnosis and treatment plan and verbalizes understanding of same.     Kristy Royals [name], PA-C 04/17/15 1751  Jene Every, MD 04/20/15 [phone]

## 2015-04-17 NOTE — ED Notes (Signed)
Pt to ed with c/o chest pain that started on Friday,  Pt was seen here for same and was told to come back if pain got worse.  Pt reports pain seems to be getting worse, now sharp and shooting from central chest to right chest.

## 2015-04-17 NOTE — ED Notes (Signed)
Spoke with Dr Mayford Knife regarding pt,  He states pt does not need any labs and should be seen in POD D.

## 2015-04-18 ENCOUNTER — Ambulatory Visit: Payer: Medicaid Other | Admitting: Obstetrics and Gynecology

## 2015-05-20 ENCOUNTER — Emergency Department: Payer: Medicaid Other

## 2015-05-20 ENCOUNTER — Emergency Department
Admission: EM | Admit: 2015-05-20 | Discharge: 2015-05-20 | Disposition: A | Discharge status: Home / Self Care | Payer: Medicaid Other | Attending: Emergency Medicine | Admitting: Emergency Medicine

## 2015-05-20 DIAGNOSIS — Z87891 Personal history of nicotine dependence: Secondary | ICD-10-CM | POA: Diagnosis not present

## 2015-05-20 DIAGNOSIS — Y9389 Activity, other specified: Secondary | ICD-10-CM | POA: Insufficient documentation

## 2015-05-20 DIAGNOSIS — Y92009 Unspecified place in unspecified non-institutional (private) residence as the place of occurrence of the external cause: Secondary | ICD-10-CM | POA: Diagnosis not present

## 2015-05-20 DIAGNOSIS — Z791 Long term (current) use of non-steroidal anti-inflammatories (NSAID): Secondary | ICD-10-CM | POA: Diagnosis not present

## 2015-05-20 DIAGNOSIS — S199XXA Unspecified injury of neck, initial encounter: Secondary | ICD-10-CM | POA: Diagnosis not present

## 2015-05-20 DIAGNOSIS — M542 Cervicalgia: Secondary | ICD-10-CM

## 2015-05-20 DIAGNOSIS — Y998 Other external cause status: Secondary | ICD-10-CM | POA: Diagnosis not present

## 2015-05-20 DIAGNOSIS — Z79899 Other long term (current) drug therapy: Secondary | ICD-10-CM | POA: Insufficient documentation

## 2015-05-20 DIAGNOSIS — S4991XA Unspecified injury of right shoulder and upper arm, initial encounter: Secondary | ICD-10-CM | POA: Diagnosis present

## 2015-05-20 DIAGNOSIS — Z880 Allergy status to penicillin: Secondary | ICD-10-CM | POA: Insufficient documentation

## 2015-05-20 DIAGNOSIS — M25511 Pain in right shoulder: Secondary | ICD-10-CM

## 2015-05-20 MED ORDER — ONDANSETRON 8 MG PO TBDP
8.0000 mg | ORAL_TABLET | Freq: Once | ORAL | Status: AC
Start: 2015-05-20 — End: 2015-05-20
  Administered 2015-05-20: 8 mg via ORAL
  Filled 2015-05-20: qty 1

## 2015-05-20 MED ORDER — HYDROCODONE-ACETAMINOPHEN 5-325 MG PO TABS
1.0000 | ORAL_TABLET | Freq: Once | ORAL | Status: AC
  Administered 2015-05-20: 1 via ORAL
  Filled 2015-05-20: qty 1

## 2015-05-20 MED ORDER — MELOXICAM 15 MG PO TABS: 15.0000 mg | ORAL_TABLET | Freq: Every day | ORAL | Status: DC

## 2015-05-20 MED ORDER — HYDROCODONE-ACETAMINOPHEN 5-325 MG PO TABS: 1.0000 | ORAL_TABLET | ORAL | Status: DC | PRN
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
## 2015-07-08 ENCOUNTER — Encounter: Payer: Self-pay | Admitting: Emergency Medicine

## 2015-07-08 ENCOUNTER — Emergency Department: Payer: Medicaid Other

## 2015-07-08 ENCOUNTER — Emergency Department
Admission: EM | Admit: 2015-07-08 | Discharge: 2015-07-08 | Disposition: A | Discharge status: Home / Self Care | Payer: Medicaid Other | Attending: Student | Admitting: Student

## 2015-07-08 DIAGNOSIS — Z79899 Other long term (current) drug therapy: Secondary | ICD-10-CM | POA: Insufficient documentation

## 2015-07-08 DIAGNOSIS — Z791 Long term (current) use of non-steroidal anti-inflammatories (NSAID): Secondary | ICD-10-CM | POA: Insufficient documentation

## 2015-07-08 DIAGNOSIS — Y929 Unspecified place or not applicable: Secondary | ICD-10-CM | POA: Insufficient documentation

## 2015-07-08 DIAGNOSIS — M7501 Adhesive capsulitis of right shoulder: Secondary | ICD-10-CM

## 2015-07-08 DIAGNOSIS — Z87891 Personal history of nicotine dependence: Secondary | ICD-10-CM | POA: Insufficient documentation

## 2015-07-08 DIAGNOSIS — Y939 Activity, unspecified: Secondary | ICD-10-CM | POA: Diagnosis not present

## 2015-07-08 DIAGNOSIS — W06XXXA Fall from bed, initial encounter: Secondary | ICD-10-CM | POA: Insufficient documentation

## 2015-07-08 DIAGNOSIS — J45909 Unspecified asthma, uncomplicated: Secondary | ICD-10-CM | POA: Insufficient documentation

## 2015-07-08 DIAGNOSIS — Y999 Unspecified external cause status: Secondary | ICD-10-CM | POA: Insufficient documentation

## 2015-07-08 DIAGNOSIS — R300 Dysuria: Secondary | ICD-10-CM | POA: Insufficient documentation

## 2015-07-08 DIAGNOSIS — M25511 Pain in right shoulder: Secondary | ICD-10-CM | POA: Diagnosis present

## 2015-07-08 LAB — URINALYSIS COMPLETE WITH MICROSCOPIC (ARMC ONLY)
BACTERIA UA: NONE SEEN
Bilirubin Urine: NEGATIVE
Glucose, UA: NEGATIVE mg/dL
HGB URINE DIPSTICK: NEGATIVE
Ketones, ur: NEGATIVE mg/dL
Nitrite: NEGATIVE
PH: 6 (ref 5.0–8.0)
PROTEIN: NEGATIVE mg/dL
SPECIFIC GRAVITY, URINE: 1.015 (ref 1.005–1.030)

## 2015-07-08 LAB — POCT PREGNANCY, URINE: Preg Test, Ur: NEGATIVE

## 2015-07-08 MED ORDER — OXYCODONE-ACETAMINOPHEN 5-325 MG PO TABS
1.0000 | ORAL_TABLET | Freq: Once | ORAL | Status: AC
  Administered 2015-07-08: 1 via ORAL
  Filled 2015-07-08: qty 1

## 2015-07-08 MED ORDER — IBUPROFEN 800 MG PO TABS: 800.0000 mg | ORAL_TABLET | Freq: Three times a day (TID) | ORAL | Status: DC | PRN

## 2015-07-08 MED ORDER — OXYCODONE-ACETAMINOPHEN 5-325 MG PO TABS: 1.0000 | ORAL_TABLET | Freq: Four times a day (QID) | ORAL | Status: DC | PRN

## 2015-07-08 MED ORDER — IBUPROFEN 800 MG PO TABS
800.0000 mg | ORAL_TABLET | Freq: Once | ORAL | Status: AC
  Administered 2015-07-08: 800 mg via ORAL
  Filled 2015-07-08: qty 1
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
## 2015-07-08 NOTE — Discharge Instructions (Signed)
Adhesive Capsulitis Adhesive capsulitis is inflammation of the tendons and ligaments that surround the shoulder joint (shoulder capsule). This condition causes the shoulder to become stiff and painful to move. Adhesive capsulitis is also called frozen shoulder. CAUSES This condition may be caused by:  An injury to the shoulder joint.  Straining the shoulder.  Not moving the shoulder for a period of time. This can happen if your arm was injured or in a sling.  Long-standing health problems, such as:  Diabetes.  Thyroid problems.  Heart disease.  Stroke.  Rheumatoid arthritis.  Lung disease. In some cases, the cause may not be known. RISK FACTORS This condition is more likely to develop in:  Women.  People who are older than 21 years of age. SYMPTOMS Symptoms of this condition include:  Pain in the shoulder when moving the arm. There may also be pain when parts of the shoulder are touched. The pain is worse at night or when at rest.  Soreness or aching in the shoulder.  Inability to move the shoulder normally.  Muscle spasms. DIAGNOSIS This condition is diagnosed with a physical exam and imaging tests, such as an X-ray or MRI. TREATMENT This condition may be treated with:  Treatment of the underlying cause or condition.  Physical therapy. This involves performing exercises to get the shoulder moving again.  Medicine. Medicine may be given to relieve pain, inflammation, or muscle spasms.  Steroid injections into the shoulder joint.  Shoulder manipulation. This is a procedure to move the shoulder into another position. It is done after you are given a medicine to make you fall asleep (general anesthetic). The joint may also be injected with salt water at high pressure to break down scarring.  Surgery. This may be done in severe cases when other treatments have failed. Although most people recover completely from adhesive capsulitis, some may not regain the full  movement of the shoulder. HOME CARE INSTRUCTIONS  Take over-the-counter and prescription medicines only as told by your health care provider.  If you are being treated with physical therapy, follow instructions from your physical therapist.  Avoid exercises that put a lot of demand on your shoulder, such as throwing. These exercises can make pain worse.  If directed, apply ice to the injured area:  Put ice in a plastic bag.  Place a towel between your skin and the bag.  Leave the ice on for 20 minutes, 2-3 times per day. SEEK MEDICAL CARE IF:  You develop new symptoms.  Your symptoms get worse.   This information is not intended to replace advice given to you by your health care provider. Make sure you discuss any questions you have with your health care provider.   Document Released: 12/28/2008 Document Revised: 11/21/2014 Document Reviewed: 06/25/2014 Elsevier Interactive Patient Education 2016 [employer]   Continue pain medicine as directed. Begin exercises to improve range of motion of the right shoulder. Follow-up with orthopedics for further evaluation.
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
## 2015-08-01 ENCOUNTER — Encounter: Payer: Self-pay | Admitting: *Deleted

## 2015-08-01 ENCOUNTER — Emergency Department
Admission: EM | Admit: 2015-08-01 | Discharge: 2015-08-01 | Disposition: A | Discharge status: Home / Self Care | Payer: Medicaid Other | Attending: Emergency Medicine | Admitting: Emergency Medicine

## 2015-08-01 ENCOUNTER — Emergency Department: Payer: Medicaid Other

## 2015-08-01 DIAGNOSIS — Z79899 Other long term (current) drug therapy: Secondary | ICD-10-CM | POA: Insufficient documentation

## 2015-08-01 DIAGNOSIS — Z87891 Personal history of nicotine dependence: Secondary | ICD-10-CM | POA: Diagnosis not present

## 2015-08-01 DIAGNOSIS — Z791 Long term (current) use of non-steroidal anti-inflammatories (NSAID): Secondary | ICD-10-CM | POA: Diagnosis not present

## 2015-08-01 DIAGNOSIS — R079 Chest pain, unspecified: Secondary | ICD-10-CM

## 2015-08-01 DIAGNOSIS — J45909 Unspecified asthma, uncomplicated: Secondary | ICD-10-CM | POA: Diagnosis not present

## 2015-08-01 DIAGNOSIS — R0789 Other chest pain: Secondary | ICD-10-CM | POA: Insufficient documentation

## 2015-08-01 LAB — BASIC METABOLIC PANEL
ANION GAP: 11 (ref 5–15)
BUN: 6 mg/dL (ref 6–20)
CO2: 23 mmol/L (ref 22–32)
Calcium: 9.9 mg/dL (ref 8.9–10.3)
Chloride: 106 mmol/L (ref 101–111)
Creatinine, Ser: 0.85 mg/dL (ref 0.44–1.00)
Glucose, Bld: 104 mg/dL — ABNORMAL HIGH (ref 65–99)
POTASSIUM: 3.4 mmol/L — AB (ref 3.5–5.1)
SODIUM: 140 mmol/L (ref 135–145)

## 2015-08-01 LAB — CBC WITH DIFFERENTIAL/PLATELET
BASOS ABS: 0.1 10*3/uL (ref 0–0.1)
Basophils Relative: 1 %
EOS ABS: 0.4 10*3/uL (ref 0–0.7)
EOS PCT: 5 %
HCT: 34.5 % — ABNORMAL LOW (ref 35.0–47.0)
Hemoglobin: 11 g/dL — ABNORMAL LOW (ref 12.0–16.0)
Lymphocytes Relative: 44 %
Lymphs Abs: 3.3 10*3/uL (ref 1.0–3.6)
MCH: 26.4 pg (ref 26.0–34.0)
MCHC: 32 g/dL (ref 32.0–36.0)
MCV: 82.6 fL (ref 80.0–100.0)
Monocytes Absolute: 0.6 10*3/uL (ref 0.2–0.9)
Monocytes Relative: 8 %
NEUTROS PCT: 42 %
Neutro Abs: 3.2 10*3/uL (ref 1.4–6.5)
PLATELETS: 302 10*3/uL (ref 150–440)
RBC: 4.18 MIL/uL (ref 3.80–5.20)
RDW: 15.3 % — ABNORMAL HIGH (ref 11.5–14.5)
WBC: 7.6 10*3/uL (ref 3.6–11.0)

## 2015-08-01 LAB — PREGNANCY, URINE: PREG TEST UR: NEGATIVE

## 2015-08-01 LAB — TROPONIN I

## 2015-08-01 MED ORDER — ALBUTEROL SULFATE (2.5 MG/3ML) 0.083% IN NEBU
2.5000 mg | INHALATION_SOLUTION | Freq: Once | RESPIRATORY_TRACT | Status: AC
  Administered 2015-08-01: 2.5 mg via RESPIRATORY_TRACT
  Filled 2015-08-01: qty 3

## 2015-08-01 MED ORDER — SODIUM CHLORIDE 0.9 % IV BOLUS (SEPSIS)
1000.0000 mL | Freq: Once | INTRAVENOUS | Status: AC
  Administered 2015-08-01: 1000 mL via INTRAVENOUS

## 2015-08-01 MED ORDER — DIAZEPAM 5 MG/ML IJ SOLN
2.0000 mg | Freq: Once | INTRAMUSCULAR | Status: AC
  Administered 2015-08-01: 2 mg via INTRAVENOUS
  Filled 2015-08-01: qty 2

## 2015-08-01 MED ORDER — IOPAMIDOL (ISOVUE-370) INJECTION 76%
75.0000 mL | Freq: Once | INTRAVENOUS | Status: AC | PRN
  Administered 2015-08-01: 75 mL via INTRAVENOUS
  Filled 2015-08-01: qty 75

## 2015-08-01 MED ORDER — ONDANSETRON HCL 4 MG/2ML IJ SOLN
4.0000 mg | Freq: Once | INTRAMUSCULAR | Status: AC
  Administered 2015-08-01: 4 mg via INTRAVENOUS
  Filled 2015-08-01: qty 2

## 2015-08-01 MED ORDER — MORPHINE SULFATE (PF) 4 MG/ML IV SOLN
4.0000 mg | Freq: Once | INTRAVENOUS | Status: AC
Start: 2015-08-01 — End: 2015-08-01
  Administered 2015-08-01: 4 mg via INTRAVENOUS
  Filled 2015-08-01: qty 1

## 2015-08-01 NOTE — ED Provider Notes (Signed)
[facility] Emergency Department Provider Note   ____________________________________________  Time seen: Approximately 345 PM  I have reviewed the triage vital signs and the nursing notes.   HISTORY  Chief Complaint Shortness of Breath and Chest Pain    HPI Kristy Reid is a 20 y.o. female with a history of asthma, anxiety and anemia who is presenting to the emergency department today with central chest pain which is nonradiating. She is also planning of shortness of breath. Denies any cough. Denies any anxiety and says that she has not had this sort of sensation with her anxiety or asthma in the past. [place] Spaniel that it started this past Tuesday and has been slowly worsening symptoms. Says the chest pain is centralized and pressure-like. Says that it worsens with deep breathing. Denies any exogenous hormone intake but says that she may be pregnant. Says that she also has chronic abdominal pain related to constipation which is unchanged.Denies any drug use or smoking. Denies drinking alcohol. Says at this time the pain is an 8 out of 10.   Past Medical History  Diagnosis Date  . Asthma   . Anemia   . Anxiety   . Anemia   . Chronic back pain     Patient Active Problem List   Diagnosis Date Noted  . Anemia 01/16/2015  . Dysmenorrhea 01/16/2015  . Dyspareunia in female 01/16/2015  . Abnormal uterine bleeding 01/16/2015    Past Surgical History  Procedure Laterality Date  . Wrist sugery    . Bunionectomy Bilateral     feet    Current Outpatient Rx  Name  Route  Sig  Dispense  Refill  . albuterol (PROVENTIL HFA;VENTOLIN HFA) 108 (90 BASE) MCG/ACT inhaler   Inhalation   Inhale 2 puffs into the lungs every 6 (six) hours as needed for wheezing or shortness of breath.   1 Inhaler   2   . cyclobenzaprine (FLEXERIL) 10 MG tablet   Oral   Take 1 tablet (10 mg total) by mouth 3 (three) times daily as needed for muscle spasms.   30 tablet   0   .  diazepam (VALIUM) 5 MG tablet   Oral   Take 1 tablet (5 mg total) by mouth every 8 (eight) hours as needed for muscle spasms.   20 tablet   0   . docusate sodium (COLACE) 100 MG capsule   Oral   Take 1 capsule (100 mg total) by mouth 2 (two) times daily.   60 capsule   2   . etonogestrel-ethinyl estradiol (NUVARING) 0.12-0.015 MG/24HR vaginal ring      Insert vaginally and leave in place for 3 consecutive weeks, then remove for 1 week.   1 each   12   . HYDROcodone-acetaminophen (NORCO/VICODIN) 5-325 MG tablet   Oral   Take 1 tablet by mouth every 4 (four) hours as needed for moderate pain.   20 tablet   0   . ibuprofen (ADVIL,MOTRIN) 800 MG tablet   Oral   Take 1 tablet (800 mg total) by mouth 3 (three) times daily.   30 tablet   1   . ibuprofen (ADVIL,MOTRIN) 800 MG tablet   Oral   Take 1 tablet (800 mg total) by mouth every 8 (eight) hours as needed.   30 tablet   0   . ibuprofen (ADVIL,MOTRIN) 800 MG tablet   Oral   Take 1 tablet (800 mg total) by mouth every 8 (eight) hours as needed.  15 tablet   0   . medroxyPROGESTERone (DEPO-PROVERA) 150 MG/ML injection   Intramuscular   Inject 150 mg into the muscle every 3 (three) months.         . meloxicam (MOBIC) 15 MG tablet   Oral   Take 1 tablet (15 mg total) by mouth daily.   30 tablet   0   . ondansetron (ZOFRAN ODT) 4 MG disintegrating tablet   Oral   Take 1 tablet (4 mg total) by mouth every 6 (six) hours as needed for nausea or vomiting.   20 tablet   0   . oxyCODONE-acetaminophen (PERCOCET/ROXICET) 5-325 MG tablet   Oral   Take 1 tablet by mouth every 4 (four) hours as needed for severe pain. 1-2 tablets every 4-6 hours prn         . oxyCODONE-acetaminophen (ROXICET) 5-325 MG tablet   Oral   Take 1 tablet by mouth every 6 (six) hours as needed.   20 tablet   0   . polyethylene glycol powder (GLYCOLAX/MIRALAX) powder   Oral   Take 17 g by mouth daily.   255 g   0   . polyethylene  glycol-electrolytes (TRILYTE) 420 G solution   Oral   Take 4,000 mLs by mouth once.   4000 mL   0     Allergies Penicillins and Rocephin  Family History  Problem Relation Age of Onset  . Diabetes Mother   . Diabetes Maternal Grandmother   . Heart disease Maternal Grandmother   . Cancer Neg Hx   . Ovarian cancer Neg Hx     Social History Social History  Substance Use Topics  . Smoking status: Former Games developermoker  . Smokeless tobacco: Never Used  . Alcohol Use: No    Review of Systems Constitutional: No fever/chills Eyes: No visual changes. ENT: No sore throat. Cardiovascular: As above Respiratory: As above Gastrointestinal:  No nausea, no vomiting.  No diarrhea.   Genitourinary: Negative for dysuria. Musculoskeletal: Negative for back pain. Skin: Negative for rash. Neurological: Negative for headaches, focal weakness or numbness.  10-point ROS otherwise negative.  ____________________________________________   PHYSICAL EXAM:  VITAL SIGNS: ED Triage Vitals  Enc Vitals Group     BP 08/01/15 1515 142/73 mmHg     Pulse Rate 08/01/15 1515 111     Resp 08/01/15 1515 18     Temp 08/01/15 1515 99.2 F (37.3 C)     Temp Source 08/01/15 1515 Oral     SpO2 08/01/15 1515 100 %     Weight 08/01/15 1515 160 lb (72.576 kg)     Height 08/01/15 1515 5\' 2"  (1.575 m)     Head Cir --      Peak Flow --      Pain Score 08/01/15 1515 6     Pain Loc --      Pain Edu? --      Excl. in GC? --     Constitutional: Alert and oriented. Well appearing and in no acute distress. Eyes: Conjunctivae are normal. PERRL. EOMI. Head: Atraumatic. Nose: No congestion/rhinnorhea. Mouth/Throat: Mucous membranes are moist.   Neck: No stridor.   Cardiovascular: Tachycardic, regular rhythm. Grossly normal heart sounds.  Good peripheral circulation with intact and equal bilateral dorsalis pedis as well as radial pulses. Respiratory: Normal respiratory effort but with tachypnea.  No retractions.  Lungs CTAB. Gastrointestinal: Soft with mild diffuse tenderness which the patient says is her baseline. No distention. No abdominal bruits. No CVA  tenderness. Musculoskeletal: No lower extremity tenderness nor edema.  No joint effusions. Neurologic:  Normal speech and language. No gross focal neurologic deficits are appreciated.  Skin:  Skin is warm, dry and intact. No rash noted. Psychiatric: Mood and affect are normal. Speech and behavior are normal.  ____________________________________________   LABS (all labs ordered are listed, but only abnormal results are displayed)  Labs Reviewed  CBC WITH DIFFERENTIAL/PLATELET - Abnormal; Notable for the following:    Hemoglobin 11.0 (*)    HCT 34.5 (*)    RDW 15.3 (*)    All other components within normal limits  BASIC METABOLIC PANEL - Abnormal; Notable for the following:    Potassium 3.4 (*)    Glucose, Bld 104 (*)    All other components within normal limits  TROPONIN I  PREGNANCY, URINE   ____________________________________________  EKG  ED ECG REPORT I, Arelia Longest, the attending physician, personally viewed and interpreted this ECG.   Date: 08/01/2015  EKG Time: 1514  Rate: 113  Rhythm: sinus tachycardia  Axis: Normal  Intervals:none  ST&T Change: S1 every 3 T3 pattern. No elevation or depression.  ____________________________________________  RADIOLOGY  DG Chest 2 View (Final result) Result time: 08/01/15 15:41:52   Final result by Rad Results In Interface (08/01/15 15:41:52)   Narrative:   CLINICAL DATA: Chest pain and shortness of Breath  EXAM: CHEST 2 VIEW  COMPARISON: 04/12/2015  FINDINGS: The heart size and mediastinal contours are within normal limits. Both lungs are clear. The visualized skeletal structures are unremarkable.  IMPRESSION: No active cardiopulmonary disease.   Electronically Signed By: Signa Kell M.D. On: 08/01/2015 15:41          CT Angio Chest PE  W/Cm &/Or Wo Cm (Final result) Result time: 08/01/15 17:48:11   Final result by Rad Results In Interface (08/01/15 17:48:11)   Narrative:   CLINICAL DATA: Chest pain and shortness of breath beginning 07/30/2015. No known injury. Initial encounter.  EXAM: CT ANGIOGRAPHY CHEST WITH CONTRAST  TECHNIQUE: Multidetector CT imaging of the chest was performed using the standard protocol during bolus administration of intravenous contrast. Multiplanar CT image reconstructions and MIPs were obtained to evaluate the vascular anatomy.  CONTRAST: 75 cc Isovue 370.  COMPARISON: PA and lateral chest earlier today and 04/12/2015.  FINDINGS: No pulmonary embolus is identified. Heart size is normal. No axillary, hilar or mediastinal lymphadenopathy is seen. No pleural or pericardial effusion. The lungs are clear. Visualized upper abdomen is unremarkable. No bony abnormality is seen.  Review of the MIP images confirms the above findings.  IMPRESSION: Negative for pulmonary embolus. Negative chest CT.   Electronically Signed By: Drusilla Kanner M.D. On: 08/01/2015 17:48        ____________________________________________   PROCEDURES   ____________________________________________   INITIAL IMPRESSION / ASSESSMENT AND PLAN / ED COURSE  Pertinent labs & imaging results that were available during my care of the patient were reviewed by me and considered in my medical decision making (see chart for details).  ----------------------------------------- 6:29 PM on 08/01/2015 -----------------------------------------  Patient is resting comfortably at this time. Heart rate 98 and respiratory rate 16 after Valium. Very reassuring workup. Multiple previous visits for similar chest pain. Possible anxiety causing the patient's symptoms today. Patient has had symptoms over the past 3 days. Feel that if this were an emergent cardiac pathology that she would be showing  abnormalities on her EKG as well as lab work at this time. Will be discharged home. Explained the  results of the patient as well as the plan and she is understanding lung to comply. ____________________________________________   FINAL CLINICAL IMPRESSION(S) / ED DIAGNOSES  Final diagnoses:  Chest pain      NEW MEDICATIONS STARTED DURING THIS VISIT:  New Prescriptions   No medications on file     Note:  This document was prepared using Dragon voice recognition software and may include unintentional dictation errors.    Myrna Blazer, MD 08/01/15 [phone]

## 2015-08-01 NOTE — ED Notes (Signed)
States mid center chest pain and SOB since Tuesday, states hx of asthma, used inhalers with no relief

## 2015-08-01 NOTE — ED Notes (Signed)

## 2015-08-29 ENCOUNTER — Emergency Department: Payer: Medicaid Other

## 2015-08-29 ENCOUNTER — Emergency Department
Admission: EM | Admit: 2015-08-29 | Discharge: 2015-08-29 | Disposition: A | Discharge status: Home / Self Care | Payer: Medicaid Other | Attending: Emergency Medicine | Admitting: Emergency Medicine

## 2015-08-29 DIAGNOSIS — Z87891 Personal history of nicotine dependence: Secondary | ICD-10-CM | POA: Insufficient documentation

## 2015-08-29 DIAGNOSIS — Z3201 Encounter for pregnancy test, result positive: Secondary | ICD-10-CM

## 2015-08-29 DIAGNOSIS — Z3A01 Less than 8 weeks gestation of pregnancy: Secondary | ICD-10-CM | POA: Insufficient documentation

## 2015-08-29 DIAGNOSIS — R1030 Lower abdominal pain, unspecified: Secondary | ICD-10-CM | POA: Insufficient documentation

## 2015-08-29 DIAGNOSIS — R51 Headache: Secondary | ICD-10-CM | POA: Diagnosis present

## 2015-08-29 DIAGNOSIS — Z79899 Other long term (current) drug therapy: Secondary | ICD-10-CM | POA: Diagnosis not present

## 2015-08-29 DIAGNOSIS — R109 Unspecified abdominal pain: Secondary | ICD-10-CM

## 2015-08-29 DIAGNOSIS — O99511 Diseases of the respiratory system complicating pregnancy, first trimester: Secondary | ICD-10-CM | POA: Diagnosis not present

## 2015-08-29 DIAGNOSIS — J45901 Unspecified asthma with (acute) exacerbation: Secondary | ICD-10-CM

## 2015-08-29 DIAGNOSIS — O26891 Other specified pregnancy related conditions, first trimester: Secondary | ICD-10-CM | POA: Insufficient documentation

## 2015-08-29 DIAGNOSIS — R911 Solitary pulmonary nodule: Secondary | ICD-10-CM | POA: Insufficient documentation

## 2015-08-29 LAB — CBC
HEMATOCRIT: 32.5 % — AB (ref 35.0–47.0)
HEMOGLOBIN: 10.4 g/dL — AB (ref 12.0–16.0)
MCH: 25.1 pg — AB (ref 26.0–34.0)
MCHC: 31.9 g/dL — ABNORMAL LOW (ref 32.0–36.0)
MCV: 78.6 fL — AB (ref 80.0–100.0)
PLATELETS: 265 10*3/uL (ref 150–440)
RBC: 4.14 MIL/uL (ref 3.80–5.20)
RDW: 15.9 % — ABNORMAL HIGH (ref 11.5–14.5)
WBC: 6.2 10*3/uL (ref 3.6–11.0)

## 2015-08-29 LAB — BASIC METABOLIC PANEL
Anion gap: 11 (ref 5–15)
BUN: 8 mg/dL (ref 6–20)
CALCIUM: 9.2 mg/dL (ref 8.9–10.3)
CO2: 21 mmol/L — AB (ref 22–32)
CREATININE: 0.96 mg/dL (ref 0.44–1.00)
Chloride: 107 mmol/L (ref 101–111)
GFR calc Af Amer: >60 mL/min (ref >60)
GLUCOSE: 113 mg/dL — AB (ref 65–99)
Potassium: 3.4 mmol/L — ABNORMAL LOW (ref 3.5–5.1)
Sodium: 139 mmol/L (ref 135–145)

## 2015-08-29 LAB — URINALYSIS COMPLETE WITH MICROSCOPIC (ARMC ONLY)
BILIRUBIN URINE: NEGATIVE
GLUCOSE, UA: NEGATIVE mg/dL
HGB URINE DIPSTICK: NEGATIVE
Ketones, ur: NEGATIVE mg/dL
Leukocytes, UA: NEGATIVE
Nitrite: NEGATIVE
Protein, ur: NEGATIVE mg/dL
Specific Gravity, Urine: 1.012 (ref 1.005–1.030)
pH: 6 (ref 5.0–8.0)

## 2015-08-29 LAB — FIBRIN DERIVATIVES D-DIMER (ARMC ONLY): FIBRIN DERIVATIVES D-DIMER (ARMC): 402 (ref 0–499)

## 2015-08-29 LAB — POCT PREGNANCY, URINE: Preg Test, Ur: POSITIVE — AB

## 2015-08-29 LAB — HCG, QUANTITATIVE, PREGNANCY: hCG, Beta Chain, Quant, S: 313 m[IU]/mL — ABNORMAL HIGH (ref <5)

## 2015-08-29 LAB — TROPONIN I: Troponin I: <0.03 ng/mL (ref <0.031)

## 2015-08-29 MED ORDER — IPRATROPIUM-ALBUTEROL 0.5-2.5 (3) MG/3ML IN SOLN
3.0000 mL | Freq: Once | RESPIRATORY_TRACT | Status: AC
  Administered 2015-08-29: 3 mL via RESPIRATORY_TRACT
  Filled 2015-08-29: qty 3

## 2015-08-29 MED ORDER — PROMETHAZINE HCL 25 MG RE SUPP: 25.0000 mg | Freq: Four times a day (QID) | RECTAL | Status: DC | PRN

## 2015-08-29 MED ORDER — ACETAMINOPHEN 325 MG PO TABS
650.0000 mg | ORAL_TABLET | Freq: Once | ORAL | Status: AC
  Administered 2015-08-29: 650 mg via ORAL
  Filled 2015-08-29: qty 2

## 2015-08-29 MED ORDER — PREDNISONE 10 MG PO TABS: 50.0000 mg | ORAL_TABLET | Freq: Every day | ORAL | Status: DC

## 2015-08-29 MED ORDER — PREDNISONE 20 MG PO TABS
50.0000 mg | ORAL_TABLET | Freq: Once | ORAL | Status: AC
  Administered 2015-08-29: 50 mg via ORAL
  Filled 2015-08-29: qty 2

## 2015-08-29 NOTE — ED Notes (Signed)
Pt her for productive cough, chest pain and headache. Chest pain worse with exertion. Occasional SOB.

## 2015-08-29 NOTE — Discharge Instructions (Signed)
You were evaluated for wheezing and trouble breathing and are being treated for asthma exacerbation with prednisone for the next 4 days. You should take your albuterol inhaler 2 puffs every 4 hours for wheezing and shortness of breath.  You were also found to be pregnant. Follow up with your primary care physician and OB/GYN. For nausea and vomiting he may try over-the-counter B6 supplement, and ginger. For additional nausea is uncontrolled with those first line treatments, you may try Phenergan suppositories are provided.  You will need repeat beta hCG in 48 hours, Saturday evening or Sunday morning, may have drawn here in the emergency department.  Return to the emergency room any new or worsening condition, including trouble breathing or shortness of breath, dizziness or passing out, weakness or numbness, abdominal pain, or vaginal bleeding.  Small left lung nodule was seen and radiology recommends chest CT in 3 or 4 months. Discussed with your primary care physician next steps for this, because she may not feel to have that due to pregnancy.  Asthma, Adult Asthma is a condition of the lungs in which the airways tighten and narrow. Asthma can make it hard to breathe. Asthma cannot be cured, but medicine and lifestyle changes can help control it. Asthma may be started (triggered) by:  Animal skin flakes (dander).  Dust.  Cockroaches.  Pollen.  Mold.  Smoke.  Cleaning products.  Hair sprays or aerosol sprays.  Paint fumes or strong smells.  Cold air, weather changes, and winds.  Crying or laughing hard.  Stress.  Certain medicines or drugs.  Foods, such as dried fruit, potato chips, and sparkling grape juice.  Infections or conditions (colds, flu).  Exercise.  Certain medical conditions or diseases.  Exercise or tiring activities. HOME CARE   Take medicine as told by your doctor.  Use a peak flow meter as told by your doctor. A peak flow meter is a tool that  measures how well the lungs are working.  Record and keep track of the peak flow meter's readings.  Understand and use the asthma action plan. An asthma action plan is a written plan for taking care of your asthma and treating your attacks.  To help prevent asthma attacks:  Do not smoke. Stay away from secondhand smoke.  Change your heating and air conditioning filter often.  Limit your use of fireplaces and wood stoves.  Get rid of pests (such as roaches and mice) and their droppings.  Throw away plants if you see mold on them.  Clean your floors. Dust regularly. Use cleaning products that do not smell.  Have someone vacuum when you are not home. Use a vacuum cleaner with a HEPA filter if possible.  Replace carpet with wood, tile, or vinyl flooring. Carpet can trap animal skin flakes and dust.  Use allergy-proof pillows, mattress covers, and box spring covers.  Wash bed sheets and blankets every week in hot water and dry them in a dryer.  Use blankets that are made of polyester or cotton.  Clean bathrooms and kitchens with bleach. If possible, have someone repaint the walls in these rooms with mold-resistant paint. Keep out of the rooms that are being cleaned and painted.  Wash hands often. GET HELP IF:  You have make a whistling sound when breaking (wheeze), have shortness of breath, or have a cough even if taking medicine to prevent attacks.  The colored mucus you cough up (sputum) is thicker than usual.  The colored mucus you cough up changes from  clear or white to yellow, green, gray, or bloody.  You have problems from the medicine you are taking such as:  A rash.  Itching.  Swelling.  Trouble breathing.  You need reliever medicines more than 2-3 times a week.  Your peak flow measurement is still at 50-79% of your personal best after following the action plan for 1 hour.  You have a fever. GET HELP RIGHT AWAY IF:   You seem to be worse and are not  responding to medicine during an asthma attack.  You are short of breath even at rest.  You get short of breath when doing very little activity.  You have trouble eating, drinking, or talking.  You have chest pain.  You have a fast heartbeat.  Your lips or fingernails start to turn blue.  You are light-headed, dizzy, or faint.  Your peak flow is less than 50% of your personal best.   This information is not intended to replace advice given to you by your health care provider. Make sure you discuss any questions you have with your health care provider.   Document Released: 08/19/2007 Document Revised: 11/21/2014 Document Reviewed: 09/29/2012 Elsevier Interactive Patient Education [employer] First Trimester of Pregnancy The first trimester of pregnancy is from week 1 until the end of week 12 (months 1 through 3). A week after a sperm fertilizes an egg, the egg will implant on the wall of the uterus. This embryo will begin to develop into a baby. Genes from you and your partner are forming the baby. The female genes determine whether the baby is a boy or a girl. At 6-8 weeks, the eyes and face are formed, and the heartbeat can be seen on ultrasound. At the end of 12 weeks, all the baby's organs are formed.  Now that you are pregnant, you will want to do everything you can to have a healthy baby. Two of the most important things are to get good prenatal care and to follow your health care provider's instructions. Prenatal care is all the medical care you receive before the baby's birth. This care will help prevent, find, and treat any problems during the pregnancy and childbirth. BODY CHANGES Your body goes through many changes during pregnancy. The changes vary from woman to woman.   You may gain or lose a couple of pounds at first.  You may feel sick to your stomach (nauseous) and throw up (vomit). If the vomiting is uncontrollable, call your health care provider.  You may tire  easily.  You may develop headaches that can be relieved by medicines approved by your health care provider.  You may urinate more often. Painful urination may mean you have a bladder infection.  You may develop heartburn as a result of your pregnancy.  You may develop constipation because certain hormones are causing the muscles that push waste through your intestines to slow down.  You may develop hemorrhoids or swollen, bulging veins (varicose veins).  Your breasts may begin to grow larger and become tender. Your nipples may stick out more, and the tissue that surrounds them (areola) may become darker.  Your gums may bleed and may be sensitive to brushing and flossing.  Dark spots or blotches (chloasma, mask of pregnancy) may develop on your face. This will likely fade after the baby is born.  Your menstrual periods will stop.  You may have a loss of appetite.  You may develop cravings for certain kinds of food.  You may  have changes in your emotions from day to day, such as being excited to be pregnant or being concerned that something may go wrong with the pregnancy and baby.  You may have more vivid and strange dreams.  You may have changes in your hair. These can include thickening of your hair, rapid growth, and changes in texture. Some women also have hair loss during or after pregnancy, or hair that feels dry or thin. Your hair will most likely return to normal after your baby is born. WHAT TO EXPECT AT YOUR PRENATAL VISITS During a routine prenatal visit:  You will be weighed to make sure you and the baby are growing normally.  Your blood pressure will be taken.  Your abdomen will be measured to track your baby's growth.  The fetal heartbeat will be listened to starting around week 10 or 12 of your pregnancy.  Test results from any previous visits will be discussed. Your health care provider may ask you:  How you are feeling.  If you are feeling the baby  move.  If you have had any abnormal symptoms, such as leaking fluid, bleeding, severe headaches, or abdominal cramping.  If you are using any tobacco products, including cigarettes, chewing tobacco, and electronic cigarettes.  If you have any questions. Other tests that may be performed during your first trimester include:  Blood tests to find your blood type and to check for the presence of any previous infections. They will also be used to check for low iron levels (anemia) and Rh antibodies. Later in the pregnancy, blood tests for diabetes will be done along with other tests if problems develop.  Urine tests to check for infections, diabetes, or protein in the urine.  An ultrasound to confirm the proper growth and development of the baby.  An amniocentesis to check for possible genetic problems.  Fetal screens for spina bifida and Down syndrome.  You may need other tests to make sure you and the baby are doing well.  HIV (human immunodeficiency virus) testing. Routine prenatal testing includes screening for HIV, unless you choose not to have this test. HOME CARE INSTRUCTIONS  Medicines  Follow your health care provider's instructions regarding medicine use. Specific medicines may be either safe or unsafe to take during pregnancy.  Take your prenatal vitamins as directed.  If you develop constipation, try taking a stool softener if your health care provider approves. Diet  Eat regular, well-balanced meals. Choose a variety of foods, such as meat or vegetable-based protein, fish, milk and low-fat dairy products, vegetables, fruits, and whole grain breads and cereals. Your health care provider will help you determine the amount of weight gain that is right for you.  Avoid raw meat and uncooked cheese. These carry germs that can cause birth defects in the baby.  Eating four or five small meals rather than three large meals a day may help relieve nausea and vomiting. If you start to  feel nauseous, eating a few soda crackers can be helpful. Drinking liquids between meals instead of during meals also seems to help nausea and vomiting.  If you develop constipation, eat more high-fiber foods, such as fresh vegetables or fruit and whole grains. Drink enough fluids to keep your urine clear or pale yellow. Activity and Exercise  Exercise only as directed by your health care provider. Exercising will help you:  Control your weight.  Stay in shape.  Be prepared for labor and delivery.  Experiencing pain or cramping in the lower  abdomen or low back is a good sign that you should stop exercising. Check with your health care provider before continuing normal exercises.  Try to avoid standing for long periods of time. Move your legs often if you must stand in one place for a long time.  Avoid heavy lifting.  Wear low-heeled shoes, and practice good posture.  You may continue to have sex unless your health care provider directs you otherwise. Relief of Pain or Discomfort  Wear a good support bra for breast tenderness.   Take warm sitz baths to soothe any pain or discomfort caused by hemorrhoids. Use hemorrhoid cream if your health care provider approves.   Rest with your legs elevated if you have leg cramps or low back pain.  If you develop varicose veins in your legs, wear support hose. Elevate your feet for 15 minutes, 3-4 times a day. Limit salt in your diet. Prenatal Care  Schedule your prenatal visits by the twelfth week of pregnancy. They are usually scheduled monthly at first, then more often in the last 2 months before delivery.  Write down your questions. Take them to your prenatal visits.  Keep all your prenatal visits as directed by your health care provider. Safety  Wear your seat belt at all times when driving.  Make a list of emergency phone numbers, including numbers for family, friends, the hospital, and police and fire departments. General  Tips  Ask your health care provider for a referral to a local prenatal education class. Begin classes no later than at the beginning of month 6 of your pregnancy.  Ask for help if you have counseling or nutritional needs during pregnancy. Your health care provider can offer advice or refer you to specialists for help with various needs.  Do not use hot tubs, steam rooms, or saunas.  Do not douche or use tampons or scented sanitary pads.  Do not cross your legs for long periods of time.  Avoid cat litter boxes and soil used by cats. These carry germs that can cause birth defects in the baby and possibly loss of the fetus by miscarriage or stillbirth.  Avoid all smoking, herbs, alcohol, and medicines not prescribed by your health care provider. Chemicals in these affect the formation and growth of the baby.  Do not use any tobacco products, including cigarettes, chewing tobacco, and electronic cigarettes. If you need help quitting, ask your health care provider. You may receive counseling support and other resources to help you quit.  Schedule a dentist appointment. At home, brush your teeth with a soft toothbrush and be gentle when you floss. SEEK MEDICAL CARE IF:   You have dizziness.  You have mild pelvic cramps, pelvic pressure, or nagging pain in the abdominal area.  You have persistent nausea, vomiting, or diarrhea.  You have a bad smelling vaginal discharge.  You have pain with urination.  You notice increased swelling in your face, hands, legs, or ankles. SEEK IMMEDIATE MEDICAL CARE IF:   You have a fever.  You are leaking fluid from your vagina.  You have spotting or bleeding from your vagina.  You have severe abdominal cramping or pain.  You have rapid weight gain or loss.  You vomit blood or material that looks like coffee grounds.  You are exposed to [place] measles and have never had them.  You are exposed to fifth disease or chickenpox.  You develop a  severe headache.  You have shortness of breath.  You have any kind  of trauma, such as from a fall or a car accident.   This information is not intended to replace advice given to you by your health care provider. Make sure you discuss any questions you have with your health care provider.   Document Released: 02/24/2001 Document Revised: 03/23/2014 Document Reviewed: 01/10/2013 Elsevier Interactive Patient Education [employer]

## 2015-08-29 NOTE — ED Provider Notes (Signed)
[facility] Emergency Department Provider Note   ____________________________________________  Time seen: Approximately 3:50 PM I have reviewed the triage vital signs and the triage nursing note.  HISTORY  Chief Complaint Cough; Headache; and Chest Pain   Historian Patient  HPI Kristy Reid is a 20 y.o. female with a history of being a 7 month preemie, asthma, who is here complaining of cough chest tightness and headache and exertional shortness of breath/wheezing for the last few days. Denies fever. No pleuritic chest pain but chest tightness. She's also had some lower abdominal cramping. She states her periods are somewhat irregular and her last menstrual period was 08/03/2015.  At rest she is still having some wheezing and shortness of breath.    Past Medical History  Diagnosis Date  . Asthma   . Anemia   . Anxiety   . Anemia   . Chronic back pain     Patient Active Problem List   Diagnosis Date Noted  . Anemia 01/16/2015  . Dysmenorrhea 01/16/2015  . Dyspareunia in female 01/16/2015  . Abnormal uterine bleeding 01/16/2015    Past Surgical History  Procedure Laterality Date  . Wrist sugery    . Bunionectomy Bilateral     feet    Current Outpatient Rx  Name  Route  Sig  Dispense  Refill  . albuterol (PROVENTIL HFA;VENTOLIN HFA) 108 (90 BASE) MCG/ACT inhaler   Inhalation   Inhale 2 puffs into the lungs every 6 (six) hours as needed for wheezing or shortness of breath.   1 Inhaler   2   . beclomethasone (QVAR) 40 MCG/ACT inhaler   Inhalation   Inhale 1 puff into the lungs 2 (two) times daily.         . cyclobenzaprine (FLEXERIL) 10 MG tablet   Oral   Take 1 tablet (10 mg total) by mouth 3 (three) times daily as needed for muscle spasms.   30 tablet   0   . docusate sodium (COLACE) 100 MG capsule   Oral   Take 1 capsule (100 mg total) by mouth 2 (two) times daily.   60 capsule   2   . ibuprofen (ADVIL,MOTRIN) 800 MG  tablet   Oral   Take 1 tablet (800 mg total) by mouth 3 (three) times daily.   30 tablet   1   . etonogestrel-ethinyl estradiol (NUVARING) 0.12-0.015 MG/24HR vaginal ring      Insert vaginally and leave in place for 3 consecutive weeks, then remove for 1 week.   1 each   12   . medroxyPROGESTERone (DEPO-PROVERA) 150 MG/ML injection   Intramuscular   Inject 150 mg into the muscle every 3 (three) months.         . predniSONE (DELTASONE) 10 MG tablet   Oral   Take 5 tablets (50 mg total) by mouth daily.   20 tablet   0   . promethazine (PHENERGAN) 25 MG suppository   Rectal   Place 1 suppository (25 mg total) rectally every 6 (six) hours as needed for nausea or vomiting.   12 each   0     Allergies Penicillins and Rocephin  Family History  Problem Relation Age of Onset  . Diabetes Mother   . Diabetes Maternal Grandmother   . Heart disease Maternal Grandmother   . Cancer Neg Hx   . Ovarian cancer Neg Hx     Social History Social History  Substance Use Topics  . Smoking status: Former Games developermoker  .  Smokeless tobacco: Never Used  . Alcohol Use: No    Review of Systems  Constitutional: Negative for fever. Eyes: Negative for visual changes. ENT: Negative for sore throat. Cardiovascular: Chest tightness. Respiratory: As per history of present illness Gastrointestinal: Nausea and vomiting for about 4 days, trouble keeping anything down. Denies diarrhea.. Genitourinary: Negative for dysuria. Musculoskeletal: Negative for back pain. Skin: Negative for rash. Neurological: Negative for headache. 10 point Review of Systems otherwise negative ____________________________________________   PHYSICAL EXAM:  VITAL SIGNS: ED Triage Vitals  Enc Vitals Group     BP 08/29/15 1156 136/62 mmHg     Pulse Rate 08/29/15 1156 130     Resp 08/29/15 1156 18     Temp 08/29/15 1156 98.2 F (36.8 C)     Temp Source 08/29/15 1156 Oral     SpO2 08/29/15 1156 100 %     Weight  08/29/15 1156 165 lb (74.844 kg)     Height 08/29/15 1156 5\' 2"  (1.575 m)     Head Cir --      Peak Flow --      Pain Score 08/29/15 1156 8     Pain Loc --      Pain Edu? --      Excl. in GC? --      Constitutional: Alert and oriented. Well appearing and in no distress. HEENT   Head: Normocephalic and atraumatic.      Eyes: Conjunctivae are normal. PERRL. Normal extraocular movements.      Ears:         Nose: No congestion/rhinnorhea.   Mouth/Throat: Mucous membranes are moist.   Neck: No stridor. Cardiovascular/Chest: Normal rate, regular rhythm.  No murmurs, rubs, or gallops. Respiratory: Normal respiratory effort without tachypnea nor retractions. Tight breath sounds throughout with end expiratory wheezing especially when she coughs. No rhonchi. Gastrointestinal: Soft. No distention, no guarding, no rebound. Nontender.  Genitourinary/rectal:Deferred Musculoskeletal: Nontender with normal range of motion in all extremities. No joint effusions.  No lower extremity tenderness.  No edema. Neurologic:  Normal speech and language. No gross or focal neurologic deficits are appreciated. Skin:  Skin is warm, dry and intact. No rash noted. Psychiatric: Mood and affect are normal. Speech and behavior are normal. Patient exhibits appropriate insight and judgment.  ____________________________________________   EKG I, Governor Rooks, MD, the attending physician have personally viewed and interpreted all ECGs.  120 bpm. sinus tachycardia. Narrow QRS. Normal axis. Normal ST and T-wave ____________________________________________  LABS (pertinent positives/negatives)  Labs Reviewed  BASIC METABOLIC PANEL - Abnormal; Notable for the following:    Potassium 3.4 (*)    CO2 21 (*)    Glucose, Bld 113 (*)    All other components within normal limits  CBC - Abnormal; Notable for the following:    Hemoglobin 10.4 (*)    HCT 32.5 (*)    MCV 78.6 (*)    MCH 25.1 (*)    MCHC 31.9 (*)     RDW 15.9 (*)    All other components within normal limits  HCG, QUANTITATIVE, PREGNANCY - Abnormal; Notable for the following:    hCG, Beta Chain, Quant, S 313 (*)    All other components within normal limits  URINALYSIS COMPLETEWITH MICROSCOPIC ([facility] ONLY) - Abnormal; Notable for the following:    Color, Urine YELLOW (*)    APPearance HAZY (*)    Bacteria, UA RARE (*)    Squamous Epithelial / LPF 6-30 (*)    All other components within  normal limits  POCT PREGNANCY, URINE - Abnormal; Notable for the following:    Preg Test, Ur POSITIVE (*)    All other components within normal limits  URINE CULTURE  TROPONIN I  FIBRIN DERIVATIVES D-DIMER ([facility] ONLY)  POC URINE PREG, ED    ____________________________________________  RADIOLOGY All Xrays were viewed by me. Imaging interpreted by Radiologist.  Chest two-view:   IMPRESSION: No edema or consolidation.  Comment: On review of the prior chest CT, there is a nodular opacity in the posterior segment left upper lobe on axial slice 67 series 6 measuring 1.0 x 6.0 cm. This opacity is not appreciable by radiography. Given a nodular opacity of this nature, a follow-up noncontrast enhanced chest CT in 3-4 months to assess for stability may be warranted.  Ultrasound transvaginal:  IMPRESSION: No evidence for an intrauterine pregnancy at this time. However, the endometrium is thickening and could represent decidual reaction and very early pregnancy.  Normal appearance of the ovaries. __________________________________________  PROCEDURES  Procedure(s) performed: None  Critical Care performed: None  ____________________________________________   ED COURSE / ASSESSMENT AND PLAN  Pertinent labs & imaging results that were available during my care of the patient were reviewed by me and considered in my medical decision making (see chart for details).   Patient's here for a number of complaints including chest  discomfort/tightness, shortness of breath, coughing, headache, and abdominal cramping, as well as vomiting. It seems to me that the initiating complaint has been wheezing/asthma exacerbation, and her pregnancy test was found to be positive which is probably the source for her nausea/vomiting and abdominal cramping. Denies vaginal bleeding.  She does have  bronchospasm/asthma exacerbation and I think this is the most likely cause of her complaint of exertional shortness of breath and chest tightness, however she is pregnant and we discussed the possibility of pulmonary embolism. She was tachycardic on arrival.  I am going to send a d-dimer. She has no lower extremity edema or swelling.  Been treated for asthma exacerbation with prednisone and DuoNeb treatment.  She is not complaining of vaginal bleeding or discharge and I did discuss with her deferring pelvic exam, but will confirm pregnancy with ultrasound.   bETA HCG only 300, and ultrasound nondiagnostic at this point in time. I discussed with patient 48 hour repeat beta hCG, this point this would be in the emergency department.  D-dimer negative, my suspicion for PE is extremely low given her symptoms are clinically consistent with asthma exacerbation. Urinalysis does not appear consistent with UTI especially with the squamous epithelial cells. I will send a culture.  I did discuss with her that she is recommended by radiology to have a repeat chest CT in 3-4 months for pulmonary nodule.  CONSULTATIONS:   None   Patient / Family / Caregiver informed of clinical course, medical decision-making process, and agree with plan.   I discussed return precautions, follow-up instructions, and discharged instructions with patient and/or family.   ___________________________________________   FINAL CLINICAL IMPRESSION(S) / ED DIAGNOSES   Final diagnoses:  Asthma exacerbation  Positive pregnancy test  Abdominal cramping               Note: This dictation was prepared with Dragon dictation. Any transcriptional errors that result from this process are unintentional      Governor Rooks, MD 08/29/15 2013

## 2015-08-31 ENCOUNTER — Encounter: Payer: Self-pay | Admitting: Radiology

## 2015-08-31 ENCOUNTER — Emergency Department
Admission: EM | Admit: 2015-08-31 | Discharge: 2015-08-31 | Disposition: A | Discharge status: Home / Self Care | Payer: Medicaid Other | Attending: Emergency Medicine | Admitting: Emergency Medicine

## 2015-08-31 ENCOUNTER — Emergency Department: Payer: Medicaid Other

## 2015-08-31 ENCOUNTER — Other Ambulatory Visit: Payer: Self-pay

## 2015-08-31 ENCOUNTER — Encounter: Payer: Self-pay | Admitting: *Deleted

## 2015-08-31 DIAGNOSIS — Z87891 Personal history of nicotine dependence: Secondary | ICD-10-CM | POA: Diagnosis not present

## 2015-08-31 DIAGNOSIS — O99511 Diseases of the respiratory system complicating pregnancy, first trimester: Secondary | ICD-10-CM | POA: Insufficient documentation

## 2015-08-31 DIAGNOSIS — Z79899 Other long term (current) drug therapy: Secondary | ICD-10-CM | POA: Diagnosis not present

## 2015-08-31 DIAGNOSIS — J189 Pneumonia, unspecified organism: Secondary | ICD-10-CM | POA: Insufficient documentation

## 2015-08-31 DIAGNOSIS — R071 Chest pain on breathing: Secondary | ICD-10-CM | POA: Diagnosis present

## 2015-08-31 DIAGNOSIS — J45909 Unspecified asthma, uncomplicated: Secondary | ICD-10-CM | POA: Diagnosis not present

## 2015-08-31 DIAGNOSIS — R091 Pleurisy: Secondary | ICD-10-CM | POA: Insufficient documentation

## 2015-08-31 DIAGNOSIS — Z3A01 Less than 8 weeks gestation of pregnancy: Secondary | ICD-10-CM | POA: Insufficient documentation

## 2015-08-31 LAB — CBC
HCT: 32.7 % — ABNORMAL LOW (ref 35.0–47.0)
Hemoglobin: 10.4 g/dL — ABNORMAL LOW (ref 12.0–16.0)
MCH: 24.9 pg — ABNORMAL LOW (ref 26.0–34.0)
MCHC: 31.8 g/dL — ABNORMAL LOW (ref 32.0–36.0)
MCV: 78.2 fL — ABNORMAL LOW (ref 80.0–100.0)
PLATELETS: 294 10*3/uL (ref 150–440)
RBC: 4.18 MIL/uL (ref 3.80–5.20)
RDW: 15.7 % — ABNORMAL HIGH (ref 11.5–14.5)
WBC: 6.8 10*3/uL (ref 3.6–11.0)

## 2015-08-31 LAB — BASIC METABOLIC PANEL
Anion gap: 7 (ref 5–15)
BUN: 9 mg/dL (ref 6–20)
CALCIUM: 9.4 mg/dL (ref 8.9–10.3)
CO2: 22 mmol/L (ref 22–32)
CREATININE: 0.75 mg/dL (ref 0.44–1.00)
Chloride: 109 mmol/L (ref 101–111)
GFR calc non Af Amer: >60 mL/min (ref >60)
Glucose, Bld: 87 mg/dL (ref 65–99)
Potassium: 3.9 mmol/L (ref 3.5–5.1)
SODIUM: 138 mmol/L (ref 135–145)

## 2015-08-31 LAB — HCG, QUANTITATIVE, PREGNANCY: HCG, BETA CHAIN, QUANT, S: 1246 m[IU]/mL — AB (ref <5)

## 2015-08-31 LAB — URINE CULTURE

## 2015-08-31 LAB — TROPONIN I

## 2015-08-31 MED ORDER — AZITHROMYCIN 250 MG PO TABS: ORAL_TABLET | ORAL | Status: DC

## 2015-08-31 MED ORDER — AZITHROMYCIN 500 MG PO TABS
500.0000 mg | ORAL_TABLET | Freq: Once | ORAL | Status: AC
  Administered 2015-08-31: 500 mg via ORAL
  Filled 2015-08-31: qty 1

## 2015-08-31 MED ORDER — CLINDAMYCIN HCL 150 MG PO CAPS: 450.0000 mg | ORAL_CAPSULE | Freq: Three times a day (TID) | ORAL | Status: DC

## 2015-08-31 MED ORDER — CLINDAMYCIN HCL 150 MG PO CAPS
450.0000 mg | ORAL_CAPSULE | Freq: Once | ORAL | Status: AC
  Administered 2015-08-31: 450 mg via ORAL
  Filled 2015-08-31: qty 3

## 2015-08-31 MED ORDER — IOPAMIDOL (ISOVUE-370) INJECTION 76%
100.0000 mL | Freq: Once | INTRAVENOUS | Status: AC | PRN
  Administered 2015-08-31: 175 mL via INTRAVENOUS

## 2015-08-31 NOTE — ED Notes (Signed)
Pt was seen in ED two days ago, pt reports she needs to have her HCG level checked again per MD, pt reports chest pain is not better, pt reports shortness of breath at times

## 2015-08-31 NOTE — Discharge Instructions (Signed)

## 2015-08-31 NOTE — ED Provider Notes (Signed)
[facility] Emergency Department Provider Note  ____________________________________________  Time seen: 6:40 PM  I have reviewed the triage vital signs and the nursing notes.   HISTORY  Chief Complaint Chest Pain    HPI Kristy Reid is a 20 y.o. female who complains of chest pain shortness of breath. She reports that the chest pain is pleuritic and described as tightness. It's in the left side of the chest. She is having frequent nonproductive coughing.Denies fever. Gets very short of breath with exertion. Has a history of asthma but and chronic lung disease of prematurity but no other medical history. She is currently pregnant, about 4 weeks. She was seen in the emergency department 2 days ago, had a beta quantitative 300 at that time.     Past Medical History  Diagnosis Date  . Asthma   . Anemia   . Anxiety   . Anemia   . Chronic back pain      Patient Active Problem List   Diagnosis Date Noted  . Anemia 01/16/2015  . Dysmenorrhea 01/16/2015  . Dyspareunia in female 01/16/2015  . Abnormal uterine bleeding 01/16/2015     Past Surgical History  Procedure Laterality Date  . Wrist sugery    . Bunionectomy Bilateral     feet     Current Outpatient Rx  Name  Route  Sig  Dispense  Refill  . albuterol (PROVENTIL HFA;VENTOLIN HFA) 108 (90 BASE) MCG/ACT inhaler   Inhalation   Inhale 2 puffs into the lungs every 6 (six) hours as needed for wheezing or shortness of breath.   1 Inhaler   2   . beclomethasone (QVAR) 40 MCG/ACT inhaler   Inhalation   Inhale 1 puff into the lungs 2 (two) times daily.         . cyclobenzaprine (FLEXERIL) 10 MG tablet   Oral   Take 1 tablet (10 mg total) by mouth 3 (three) times daily as needed for muscle spasms.   30 tablet   0   . ferrous sulfate 325 (65 FE) MG tablet   Oral   Take 325 mg by mouth daily with breakfast.         . predniSONE (DELTASONE) 10 MG tablet   Oral   Take 5 tablets (50 mg  total) by mouth daily.   20 tablet   0   . promethazine (PHENERGAN) 25 MG suppository   Rectal   Place 1 suppository (25 mg total) rectally every 6 (six) hours as needed for nausea or vomiting.   12 each   0   . azithromycin (ZITHROMAX Z-PAK) 250 MG tablet      Take 2 tablets (500 mg) on  Day 1,  followed by 1 tablet (250 mg) once daily on Days 2 through 5.   6 each   0   . clindamycin (CLEOCIN) 150 MG capsule   Oral   Take 3 capsules (450 mg total) by mouth 3 (three) times daily.   90 capsule   0   . docusate sodium (COLACE) 100 MG capsule   Oral   Take 1 capsule (100 mg total) by mouth 2 (two) times daily. Patient not taking: Reported on 08/31/2015   60 capsule   2   . etonogestrel-ethinyl estradiol (NUVARING) 0.12-0.015 MG/24HR vaginal ring      Insert vaginally and leave in place for 3 consecutive weeks, then remove for 1 week. Patient not taking: Reported on 08/31/2015   1 each   12   .  ibuprofen (ADVIL,MOTRIN) 800 MG tablet   Oral   Take 1 tablet (800 mg total) by mouth 3 (three) times daily. Patient not taking: Reported on 08/31/2015   30 tablet   1      Allergies Penicillins and Rocephin   Family History  Problem Relation Age of Onset  . Diabetes Mother   . Diabetes Maternal Grandmother   . Heart disease Maternal Grandmother   . Cancer Neg Hx   . Ovarian cancer Neg Hx     Social History Social History  Substance Use Topics  . Smoking status: Former Games developer  . Smokeless tobacco: Never Used  . Alcohol Use: No    Review of Systems  Constitutional:   No fever or chills.  Eyes:   No vision changes.  ENT:   No sore throat. No rhinorrhea. Cardiovascular:   Positive as above pleuritic chest pain. Respiratory:   Positive shortness of breath and nonproductive cough. Gastrointestinal:   Negative for abdominal pain, vomiting and diarrhea.  Genitourinary:   Negative for dysuria or difficulty urinating. Musculoskeletal:   Negative for focal pain or  swelling Neurological:   Negative for headaches 10-point ROS otherwise negative.  ____________________________________________   PHYSICAL EXAM:  VITAL SIGNS: ED Triage Vitals  Enc Vitals Group     BP 08/31/15 1456 132/68 mmHg     Pulse Rate 08/31/15 1456 113     Resp 08/31/15 1456 20     Temp 08/31/15 1456 98.4 F (36.9 C)     Temp Source 08/31/15 1456 Oral     SpO2 08/31/15 1456 100 %     Weight 08/31/15 1456 165 lb (74.844 kg)     Height 08/31/15 1456  (1.575 m)     Head Cir --      Peak Flow --      Pain Score 08/31/15 1456 7     Pain Loc --      Pain Edu? --      Excl. in GC? --     Vital signs reviewed, nursing assessments reviewed.   Constitutional:   Alert and oriented. Well appearing and in no distress. Eyes:   No scleral icterus. No conjunctival pallor. PERRL. EOMI.  No nystagmus. ENT   Head:   Normocephalic and atraumatic.   Nose:   No congestion/rhinnorhea. No septal hematoma   Mouth/Throat:   MMM, no pharyngeal erythema. No peritonsillar mass.    Neck:   No stridor. No SubQ emphysema. No meningismus. Hematological/Lymphatic/Immunilogical:   No cervical lymphadenopathy. Cardiovascular:   RRR. Symmetric bilateral radial and DP pulses.  No murmurs.  Respiratory:   Normal respiratory effort without tachypnea nor retractions. Breath sounds are clear and equal bilaterally. No wheezes/rales/rhonchi. Gastrointestinal:   Soft and nontender. Non distended. There is no CVA tenderness.  No rebound, rigidity, or guarding. Genitourinary:   deferred Musculoskeletal:   Nontender with normal range of motion in all extremities. No joint effusions.  No lower extremity tenderness.  No edema. Neurologic:   Normal speech and language.  CN 2-10 normal. Motor grossly intact. No gross focal neurologic deficits are appreciated.  Skin:    Skin is warm, dry and intact. No rash noted.  No petechiae, purpura, or  bullae.  ____________________________________________    LABS (pertinent positives/negatives) (all labs ordered are listed, but only abnormal results are displayed) Labs Reviewed  CBC - Abnormal; Notable for the following:    Hemoglobin 10.4 (*)    HCT 32.7 (*)    MCV 78.2 (*)  MCH 24.9 (*)    MCHC 31.8 (*)    RDW 15.7 (*)    All other components within normal limits  HCG, QUANTITATIVE, PREGNANCY - Abnormal; Notable for the following:    hCG, Beta Chain, Quant, S 1246 (*)    All other components within normal limits  BASIC METABOLIC PANEL  TROPONIN I   ____________________________________________   EKG  Interpreted by me  Date: 08/31/2015  Rate: 94  Rhythm: normal sinus rhythm  QRS Axis: normal  Intervals: normal  ST/T Wave abnormalities: normal  Conduction Disutrbances: none  Narrative Interpretation: unremarkable      ____________________________________________    RADIOLOGY  CT angiogram chest reveals chronic glass opacity in the left base consistent with pneumonia. No PE.  ____________________________________________   PROCEDURES   ____________________________________________   INITIAL IMPRESSION / ASSESSMENT AND PLAN / ED COURSE  Pertinent labs & imaging results that were available during my care of the patient were reviewed by me and considered in my medical decision making (see chart for details).  Patient presents again with pleuritic chest pain for the second time in 3 days. HCG has risen appropriately from [phone]. She is not having any vaginal bleeding or contractions. I had an extensive conversation with her about the risks and benefits of CT scan related to pregnancy both for her body and the fetus, and especially since the fetus is only [redacted] weeks along in does not have significant amniotic fluid and does not have any fetal circulation, think the risk of IV contrast to the fetus is minimal or nonexistent. Additionally there should not be a  greater sensitivity of the breast tissue to radiation. I did discuss all the concerns including lifetime increased risk of cancer, especially breast cancer, or possible teratogenic effects of ionizing radiation on early fetus, the patient accepts these risks as part of her current workup for potentially life-threatening illness.  CT scan revealed a she has a community-acquired pneumonia.  That I resurged and in consideration of her beta lactam allergies, prescribe her clindamycin and azithromycin. Have the patient follow up with primary care. She is not hypoxic, no evidence of sepsis. Very well appearing and comfortable. Suitable for outpatient follow-up.     ____________________________________________   FINAL CLINICAL IMPRESSION(S) / ED DIAGNOSES  Final diagnoses:  Community acquired pneumonia  Pleurisy     Portions of this note were generated with dragon dictation software. Dictation errors may occur despite best attempts at proofreading.   Sharman Cheek, MD 08/31/15 2249

## 2015-08-31 NOTE — ED Notes (Signed)
Patient transported to CT 

## 2015-09-12 ENCOUNTER — Emergency Department: Payer: Medicaid Other

## 2015-09-12 ENCOUNTER — Encounter: Payer: Self-pay | Admitting: Emergency Medicine

## 2015-09-12 ENCOUNTER — Emergency Department
Admission: EM | Admit: 2015-09-12 | Discharge: 2015-09-12 | Disposition: A | Discharge status: Home / Self Care | Payer: Medicaid Other | Attending: Emergency Medicine | Admitting: Emergency Medicine

## 2015-09-12 DIAGNOSIS — Z7951 Long term (current) use of inhaled steroids: Secondary | ICD-10-CM | POA: Insufficient documentation

## 2015-09-12 DIAGNOSIS — Z791 Long term (current) use of non-steroidal anti-inflammatories (NSAID): Secondary | ICD-10-CM | POA: Insufficient documentation

## 2015-09-12 DIAGNOSIS — R102 Pelvic and perineal pain: Secondary | ICD-10-CM | POA: Insufficient documentation

## 2015-09-12 DIAGNOSIS — R109 Unspecified abdominal pain: Secondary | ICD-10-CM

## 2015-09-12 DIAGNOSIS — N390 Urinary tract infection, site not specified: Secondary | ICD-10-CM

## 2015-09-12 DIAGNOSIS — F329 Major depressive disorder, single episode, unspecified: Secondary | ICD-10-CM | POA: Insufficient documentation

## 2015-09-12 DIAGNOSIS — Z3A01 Less than 8 weeks gestation of pregnancy: Secondary | ICD-10-CM | POA: Diagnosis not present

## 2015-09-12 DIAGNOSIS — J45909 Unspecified asthma, uncomplicated: Secondary | ICD-10-CM | POA: Insufficient documentation

## 2015-09-12 DIAGNOSIS — O2391 Unspecified genitourinary tract infection in pregnancy, first trimester: Secondary | ICD-10-CM | POA: Diagnosis present

## 2015-09-12 DIAGNOSIS — Z87891 Personal history of nicotine dependence: Secondary | ICD-10-CM | POA: Insufficient documentation

## 2015-09-12 DIAGNOSIS — O26899 Other specified pregnancy related conditions, unspecified trimester: Secondary | ICD-10-CM

## 2015-09-12 HISTORY — DX: Major depressive disorder, single episode, unspecified: F32.9

## 2015-09-12 HISTORY — DX: Insomnia, unspecified: G47.00

## 2015-09-12 HISTORY — DX: Depression, unspecified: F32.A

## 2015-09-12 HISTORY — DX: Post-traumatic stress disorder, unspecified: F43.10

## 2015-09-12 LAB — COMPREHENSIVE METABOLIC PANEL
ALT: 18 U/L (ref 14–54)
ANION GAP: 8 (ref 5–15)
AST: 23 U/L (ref 15–41)
Albumin: 4.2 g/dL (ref 3.5–5.0)
Alkaline Phosphatase: 90 U/L (ref 38–126)
BUN: 9 mg/dL (ref 6–20)
CALCIUM: 9.5 mg/dL (ref 8.9–10.3)
CHLORIDE: 105 mmol/L (ref 101–111)
CO2: 22 mmol/L (ref 22–32)
CREATININE: 0.82 mg/dL (ref 0.44–1.00)
Glucose, Bld: 79 mg/dL (ref 65–99)
Potassium: 3.5 mmol/L (ref 3.5–5.1)
SODIUM: 135 mmol/L (ref 135–145)
Total Bilirubin: <0.1 mg/dL — ABNORMAL LOW (ref 0.3–1.2)
Total Protein: 7.8 g/dL (ref 6.5–8.1)

## 2015-09-12 LAB — CBC
HCT: 33.8 % — ABNORMAL LOW (ref 35.0–47.0)
HEMOGLOBIN: 11 g/dL — AB (ref 12.0–16.0)
MCH: 25.4 pg — ABNORMAL LOW (ref 26.0–34.0)
MCHC: 32.6 g/dL (ref 32.0–36.0)
MCV: 77.8 fL — AB (ref 80.0–100.0)
PLATELETS: 287 10*3/uL (ref 150–440)
RBC: 4.34 MIL/uL (ref 3.80–5.20)
RDW: 16.9 % — ABNORMAL HIGH (ref 11.5–14.5)
WBC: 8.6 10*3/uL (ref 3.6–11.0)

## 2015-09-12 LAB — URINALYSIS COMPLETE WITH MICROSCOPIC (ARMC ONLY)
BILIRUBIN URINE: NEGATIVE
Glucose, UA: NEGATIVE mg/dL
HGB URINE DIPSTICK: NEGATIVE
KETONES UR: NEGATIVE mg/dL
Nitrite: NEGATIVE
PH: 6 (ref 5.0–8.0)
Protein, ur: NEGATIVE mg/dL
SPECIFIC GRAVITY, URINE: 1.021 (ref 1.005–1.030)

## 2015-09-12 LAB — CHLAMYDIA/NGC RT PCR (ARMC ONLY)
Chlamydia Tr: NOT DETECTED
N GONORRHOEAE: NOT DETECTED

## 2015-09-12 LAB — HCG, QUANTITATIVE, PREGNANCY: HCG, BETA CHAIN, QUANT, S: 64468 m[IU]/mL — AB (ref <5)

## 2015-09-12 LAB — WET PREP, GENITAL
Clue Cells Wet Prep HPF POC: NONE SEEN
SPERM: NONE SEEN
TRICH WET PREP: NONE SEEN
YEAST WET PREP: NONE SEEN

## 2015-09-12 LAB — ABO/RH: ABO/RH(D): O POS

## 2015-09-12 LAB — LIPASE, BLOOD: LIPASE: 31 U/L (ref 11–51)

## 2015-09-12 MED ORDER — NITROFURANTOIN MONOHYD MACRO 100 MG PO CAPS
100.0000 mg | ORAL_CAPSULE | Freq: Once | ORAL | Status: AC
  Administered 2015-09-12: 100 mg via ORAL
  Filled 2015-09-12 (×2): qty 1

## 2015-09-12 MED ORDER — NITROFURANTOIN MONOHYD MACRO 100 MG PO CAPS: 100.0000 mg | ORAL_CAPSULE | Freq: Two times a day (BID) | ORAL | Status: AC

## 2015-09-12 MED ORDER — SODIUM CHLORIDE 0.9 % IV BOLUS (SEPSIS)
1000.0000 mL | Freq: Once | INTRAVENOUS | Status: AC
  Administered 2015-09-12: 1000 mL via INTRAVENOUS

## 2015-09-12 MED ORDER — ONDANSETRON HCL 4 MG/2ML IJ SOLN
4.0000 mg | Freq: Once | INTRAMUSCULAR | Status: AC
  Administered 2015-09-12: 4 mg via INTRAVENOUS
  Filled 2015-09-12: qty 2

## 2015-09-12 NOTE — ED Notes (Signed)
MD at bedside. 

## 2015-09-12 NOTE — ED Notes (Signed)
Pt c/o N/V/D that started yesterday. Pt states she is [redacted] weeks pregnant. Pt states that since last night she has been experiencing abdominal cramping and has been unable to keep anything down at this time. Denies vaginal bleeding or discharge at this time.

## 2015-09-12 NOTE — ED Provider Notes (Addendum)
[facility] Emergency Department Provider Note   ____________________________________________  Time seen: Approximately 7 PM  I have reviewed the triage vital signs and the nursing notes.   HISTORY  Chief Complaint Nausea; Emesis; and Abdominal Cramping   HPI Kristy Reid is a 20 y.o. female who is a G3 P1 at 5 weeks of pregnancy respiratory with diffuse abdominal pain any episodes of vomiting today. No blood in the vomitus. Also several episodes of diarrhea. No known sick contacts. Says that she has been taking vitamin B6 as well as promethazine suppositories at home without relief. Denies any history of STDs. Denies any vaginal bleeding or discharge. Denies any dysuria. Has not started prenatal care.  Is taking prenatal vitamins.   Past Medical History  Diagnosis Date  . Asthma   . Anemia   . Anxiety   . Anemia   . Chronic back pain   . Depression   . PTSD (post-traumatic stress disorder)   . Insomnia     Patient Active Problem List   Diagnosis Date Noted  . Anemia 01/16/2015  . Dysmenorrhea 01/16/2015  . Dyspareunia in female 01/16/2015  . Abnormal uterine bleeding 01/16/2015    Past Surgical History  Procedure Laterality Date  . Wrist sugery    . Bunionectomy Bilateral     feet    Current Outpatient Rx  Name  Route  Sig  Dispense  Refill  . albuterol (PROVENTIL HFA;VENTOLIN HFA) 108 (90 BASE) MCG/ACT inhaler   Inhalation   Inhale 2 puffs into the lungs every 6 (six) hours as needed for wheezing or shortness of breath.   1 Inhaler   2   . azithromycin (ZITHROMAX Z-PAK) 250 MG tablet      Take 2 tablets (500 mg) on  Day 1,  followed by 1 tablet (250 mg) once daily on Days 2 through 5.   6 each   0   . beclomethasone (QVAR) 40 MCG/ACT inhaler   Inhalation   Inhale 1 puff into the lungs 2 (two) times daily.         . clindamycin (CLEOCIN) 150 MG capsule   Oral   Take 3 capsules (450 mg total) by mouth 3 (three) times  daily.   90 capsule   0   . cyclobenzaprine (FLEXERIL) 10 MG tablet   Oral   Take 1 tablet (10 mg total) by mouth 3 (three) times daily as needed for muscle spasms.   30 tablet   0   . docusate sodium (COLACE) 100 MG capsule   Oral   Take 1 capsule (100 mg total) by mouth 2 (two) times daily. Patient not taking: Reported on 08/31/2015   60 capsule   2   . etonogestrel-ethinyl estradiol (NUVARING) 0.12-0.015 MG/24HR vaginal ring      Insert vaginally and leave in place for 3 consecutive weeks, then remove for 1 week. Patient not taking: Reported on 08/31/2015   1 each   12   . ferrous sulfate 325 (65 FE) MG tablet   Oral   Take 325 mg by mouth daily with breakfast.         . ibuprofen (ADVIL,MOTRIN) 800 MG tablet   Oral   Take 1 tablet (800 mg total) by mouth 3 (three) times daily. Patient not taking: Reported on 08/31/2015   30 tablet   1   . predniSONE (DELTASONE) 10 MG tablet   Oral   Take 5 tablets (50 mg total) by mouth daily.  20 tablet   0   . promethazine (PHENERGAN) 25 MG suppository   Rectal   Place 1 suppository (25 mg total) rectally every 6 (six) hours as needed for nausea or vomiting.   12 each   0     Allergies Penicillins and Rocephin  Family History  Problem Relation Age of Onset  . Diabetes Mother   . Diabetes Maternal Grandmother   . Heart disease Maternal Grandmother   . Cancer Neg Hx   . Ovarian cancer Neg Hx     Social History Social History  Substance Use Topics  . Smoking status: Former Games developermoker  . Smokeless tobacco: Never Used  . Alcohol Use: No    Review of Systems Constitutional: No fever/chills Eyes: No visual changes. ENT: No sore throat. Cardiovascular: Denies chest pain. Respiratory: Denies shortness of breath. Gastrointestinal:  No constipation. Genitourinary: Negative for dysuria. Musculoskeletal: Negative for back pain. Skin: Negative for rash. Neurological: Negative for headaches, focal weakness or  numbness.  10-point ROS otherwise negative.  ____________________________________________   PHYSICAL EXAM:  VITAL SIGNS: ED Triage Vitals  Enc Vitals Group     BP 09/12/15 1753 139/84 mmHg     Pulse Rate 09/12/15 1753 105     Resp 09/12/15 1753 18     Temp 09/12/15 1753 97.9 F (36.6 C)     Temp Source 09/12/15 1753 Oral     SpO2 09/12/15 1753 100 %     Weight 09/12/15 1753 155 lb (70.308 kg)     Height 09/12/15 1753 5\' 2"  (1.575 m)     Head Cir --      Peak Flow --      Pain Score 09/12/15 1753 7     Pain Loc --      Pain Edu? --      Excl. in GC? --     Constitutional: Alert and oriented. Well appearing and in no acute distress. Eyes: Conjunctivae are normal. PERRL. EOMI. Head: Atraumatic. Nose: No congestion/rhinnorhea. Mouth/Throat: Mucous membranes are moist. Neck: No stridor.   Cardiovascular: Normal rate, regular rhythm. Grossly normal heart sounds.   Respiratory: Normal respiratory effort.  No retractions. Lungs CTAB. Gastrointestinal: Soft With mild diffuse tenderness but with moderate tenderness to the midline suprapubic region. No distention. No CVA tenderness. Genitourinary: Normal external exam. No lesions. Speculum exam with a small amount of white discharge. Bimanual exam with a closed cervical os. Mild CMT as well as mild uterine and bilateral adnexal tenderness without any masses. No bogginess. Musculoskeletal: No lower extremity tenderness nor edema.  No joint effusions. Neurologic:  Normal speech and language. No gross focal neurologic deficits are appreciated.  Skin:  Skin is warm, dry and intact. No rash noted. Psychiatric: Mood and affect are normal. Speech and behavior are normal.  ____________________________________________   LABS (all labs ordered are listed, but only abnormal results are displayed)  Labs Reviewed  COMPREHENSIVE METABOLIC PANEL - Abnormal; Notable for the following:    Total Bilirubin <0.1 (*)    All other components  within normal limits  CBC - Abnormal; Notable for the following:    Hemoglobin 11.0 (*)    HCT 33.8 (*)    MCV 77.8 (*)    MCH 25.4 (*)    RDW 16.9 (*)    All other components within normal limits  URINALYSIS COMPLETEWITH MICROSCOPIC ([facility] ONLY) - Abnormal; Notable for the following:    Color, Urine YELLOW (*)    APPearance HAZY (*)    Leukocytes,  UA TRACE (*)    Bacteria, UA RARE (*)    Squamous Epithelial / LPF 0-5 (*)    All other components within normal limits  WET PREP, GENITAL  CHLAMYDIA/NGC RT PCR ([facility] ONLY)  LIPASE, BLOOD  HCG, QUANTITATIVE, PREGNANCY  POC URINE PREG, ED   ____________________________________________  EKG   ____________________________________________  RADIOLOGY  US Art/Ven Flow Abd Pelv Doppler (Final result) Result time: 09/12/15 20:38:57   Final result by Rad Results In Interface (09/12/15 20:38:57)   Narrative:   CLINICAL DATA: Abdominal pain and cramping.  EXAM: OBSTETRIC <14 WK US AND TRANSVAGINAL OB  US DOPPLER ULTRASOUND OF OVARIES  TECHNIQUE: Both transabdominal and transvaginal ultrasound examinations were performed for complete evaluation of the gestation as well as the maternal uterus, adnexal regions, and pelvic cul-de-sac. Transvaginal technique was performed to assess early pregnancy.  Color and duplex Doppler ultrasound was utilized to evaluate blood flow to the ovaries.  COMPARISON: August 29, 2015  FINDINGS: Intrauterine gestational sac: Visualized  Yolk sac: Visualized  Embryo: Visualized  Cardiac Activity: Visualized  Heart Rate: 108 bpm  CRL:  2 mm  5 w 5 d         US EDC: May 09, 2016  Subchorionic hemorrhage: There is a subchorionic hemorrhage measuring 1.6 x 0.9 x 0.7 cm.  Maternal uterus/adnexae: Cervical os is closed. No intrauterine mass. Ovaries are normal in size and contour bilaterally. Corpus luteum measuring 1.5 x 1.5 x 1.8 cm in the left ovary. No  free pelvic fluid identified.  Pulsed Doppler evaluation of both ovaries demonstrates normal appearing low-resistance arterial and venous waveforms.  IMPRESSION: Single live intrauterine gestation with estimated gestational age of 336- weeks. Small subchorionic hemorrhage noted. Beyond a small physiologic corpus luteum on the left, there is no extrauterine pelvic or adnexal mass or free fluid. No demonstrable ovarian torsion on either side.   Electronically Signed By: Bretta BangWilliam Woodruff III M.D. On: 09/12/2015 20:38          US OB Transvaginal (Final result) Result time: 09/12/15 20:38:57   Final result by Rad Results In Interface (09/12/15 20:38:57)   Narrative:   CLINICAL DATA: Abdominal pain and cramping.  EXAM: OBSTETRIC <14 WK US AND TRANSVAGINAL OB  US DOPPLER ULTRASOUND OF OVARIES  TECHNIQUE: Both transabdominal and transvaginal ultrasound examinations were performed for complete evaluation of the gestation as well as the maternal uterus, adnexal regions, and pelvic cul-de-sac. Transvaginal technique was performed to assess early pregnancy.  Color and duplex Doppler ultrasound was utilized to evaluate blood flow to the ovaries.  COMPARISON: August 29, 2015  FINDINGS: Intrauterine gestational sac: Visualized  Yolk sac: Visualized  Embryo: Visualized  Cardiac Activity: Visualized  Heart Rate: 108 bpm  CRL:  2 mm  5 w 5 d         US EDC: May 09, 2016  Subchorionic hemorrhage: There is a subchorionic hemorrhage measuring 1.6 x 0.9 x 0.7 cm.  Maternal uterus/adnexae: Cervical os is closed. No intrauterine mass. Ovaries are normal in size and contour bilaterally. Corpus luteum measuring 1.5 x 1.5 x 1.8 cm in the left ovary. No free pelvic fluid identified.  Pulsed Doppler evaluation of both ovaries demonstrates normal appearing low-resistance arterial and venous waveforms.  IMPRESSION: Single live intrauterine gestation with  estimated gestational age of 426- weeks. Small subchorionic hemorrhage noted. Beyond a small physiologic corpus luteum on the left, there is no extrauterine pelvic or adnexal mass or free fluid. No demonstrable ovarian torsion on either side.  Electronically Signed By: Bretta Bang III M.D. On: 09/12/2015 20:38          US OB Comp Less 14 Wks (Final result) Result time: 09/12/15 20:38:57   Final result by Rad Results In Interface (09/12/15 20:38:57)   Narrative:   CLINICAL DATA: Abdominal pain and cramping.  EXAM: OBSTETRIC <14 WK [place] AND TRANSVAGINAL OB  US DOPPLER ULTRASOUND OF OVARIES  TECHNIQUE: Both transabdominal and transvaginal ultrasound examinations were performed for complete evaluation of the gestation as well as the maternal uterus, adnexal regions, and pelvic cul-de-sac. Transvaginal technique was performed to assess early pregnancy.  Color and duplex Doppler ultrasound was utilized to evaluate blood flow to the ovaries.  COMPARISON: August 29, 2015  FINDINGS: Intrauterine gestational sac: Visualized  Yolk sac: Visualized  Embryo: Visualized  Cardiac Activity: Visualized  Heart Rate: 108 bpm  CRL:  2 mm  5 w 5 d         [place] EDC: May 09, 2016  Subchorionic hemorrhage: There is a subchorionic hemorrhage measuring 1.6 x 0.9 x 0.7 cm.  Maternal uterus/adnexae: Cervical os is closed. No intrauterine mass. Ovaries are normal in size and contour bilaterally. Corpus luteum measuring 1.5 x 1.5 x 1.8 cm in the left ovary. No free pelvic fluid identified.  Pulsed Doppler evaluation of both ovaries demonstrates normal appearing low-resistance arterial and venous waveforms.  IMPRESSION: Single live intrauterine gestation with estimated gestational age of 52- weeks. Small subchorionic hemorrhage noted. Beyond a small physiologic corpus luteum on the left, there is no extrauterine pelvic or adnexal mass or free fluid. No  demonstrable ovarian torsion on either side.   Electronically Signed By: Bretta Bang III M.D. On: 09/12/2015 20:38          ____________________________________________   PROCEDURES   ____________________________________________   INITIAL IMPRESSION / ASSESSMENT AND PLAN / ED COURSE  Pertinent labs & imaging results that were available during my care of the patient were reviewed by me and considered in my medical decision making (see chart for details).  ----------------------------------------- 9:53 PM on 09/12/2015 -----------------------------------------  Patient is resting comfortable with this time and tolerating by mouth. Workup decay to small subchorionic hemorrhage as well as a possible very subtle urinary tract infection. We'll treat the urinary tract infection in the context of the subchorionic hemorrhage. Also discussed precautions such as pelvic rest as well as no heavy lifting. The patient understands plan and is willing to comply. Has her first OB/GYN appointment on July 7 at the [facility]. Possible viral cause the nausea and vomiting and diarrhea. ____________________________________________   FINAL CLINICAL IMPRESSION(S) / ED DIAGNOSES  Final diagnoses:  Abdominal pain  Abdominal pain and pregnancy. Nausea, vomiting and diarrhea.  UTI.    NEW MEDICATIONS STARTED DURING THIS VISIT:  New Prescriptions   No medications on file     Note:  This document was prepared using Dragon voice recognition software and may include unintentional dictation errors.  Patient will use Zofran sparingly because of failing multiple other antibiotics at home. Also her heart rate was taken and was 76 bpm on reexamination.  Myrna Blazer, MD 09/12/15 2154  Myrna Blazer, MD 09/12/15 2156

## 2015-09-14 LAB — URINE CULTURE

## 2015-09-28 ENCOUNTER — Encounter: Payer: Self-pay | Admitting: Emergency Medicine

## 2015-09-28 ENCOUNTER — Emergency Department
Admission: EM | Admit: 2015-09-28 | Discharge: 2015-09-28 | Disposition: A | Discharge status: Home / Self Care | Payer: Medicaid Other | Attending: Emergency Medicine | Admitting: Emergency Medicine

## 2015-09-28 DIAGNOSIS — Z3A08 8 weeks gestation of pregnancy: Secondary | ICD-10-CM | POA: Diagnosis not present

## 2015-09-28 DIAGNOSIS — O219 Vomiting of pregnancy, unspecified: Secondary | ICD-10-CM

## 2015-09-28 DIAGNOSIS — F329 Major depressive disorder, single episode, unspecified: Secondary | ICD-10-CM | POA: Diagnosis not present

## 2015-09-28 DIAGNOSIS — N898 Other specified noninflammatory disorders of vagina: Secondary | ICD-10-CM | POA: Diagnosis not present

## 2015-09-28 DIAGNOSIS — B373 Candidiasis of vulva and vagina: Secondary | ICD-10-CM | POA: Diagnosis not present

## 2015-09-28 DIAGNOSIS — O23591 Infection of other part of genital tract in pregnancy, first trimester: Secondary | ICD-10-CM | POA: Insufficient documentation

## 2015-09-28 DIAGNOSIS — Z87891 Personal history of nicotine dependence: Secondary | ICD-10-CM | POA: Insufficient documentation

## 2015-09-28 DIAGNOSIS — J45909 Unspecified asthma, uncomplicated: Secondary | ICD-10-CM | POA: Diagnosis not present

## 2015-09-28 DIAGNOSIS — B3731 Acute candidiasis of vulva and vagina: Secondary | ICD-10-CM

## 2015-09-28 LAB — URINALYSIS COMPLETE WITH MICROSCOPIC (ARMC ONLY)
BACTERIA UA: NONE SEEN
Bilirubin Urine: NEGATIVE
Glucose, UA: NEGATIVE mg/dL
Hgb urine dipstick: NEGATIVE
Nitrite: NEGATIVE
PROTEIN: 30 mg/dL — AB
Specific Gravity, Urine: 1.027 (ref 1.005–1.030)
pH: 6 (ref 5.0–8.0)

## 2015-09-28 LAB — CBC WITH DIFFERENTIAL/PLATELET
BASOS PCT: 1 %
Basophils Absolute: 0 10*3/uL (ref 0–0.1)
EOS ABS: 0.1 10*3/uL (ref 0–0.7)
Eosinophils Relative: 1 %
HEMATOCRIT: 37.9 % (ref 35.0–47.0)
HEMOGLOBIN: 12.2 g/dL (ref 12.0–16.0)
LYMPHS ABS: 3.2 10*3/uL (ref 1.0–3.6)
Lymphocytes Relative: 45 %
MCH: 26.1 pg (ref 26.0–34.0)
MCHC: 32.2 g/dL (ref 32.0–36.0)
MCV: 80.9 fL (ref 80.0–100.0)
MONO ABS: 0.6 10*3/uL (ref 0.2–0.9)
MONOS PCT: 9 %
NEUTROS ABS: 3 10*3/uL (ref 1.4–6.5)
NEUTROS PCT: 44 %
Platelets: 264 10*3/uL (ref 150–440)
RBC: 4.69 MIL/uL (ref 3.80–5.20)
RDW: 19.6 % — ABNORMAL HIGH (ref 11.5–14.5)
WBC: 7 10*3/uL (ref 3.6–11.0)

## 2015-09-28 LAB — BASIC METABOLIC PANEL
Anion gap: 8 (ref 5–15)
BUN: 6 mg/dL (ref 6–20)
CALCIUM: 9.4 mg/dL (ref 8.9–10.3)
CHLORIDE: 105 mmol/L (ref 101–111)
CO2: 21 mmol/L — AB (ref 22–32)
CREATININE: 0.75 mg/dL (ref 0.44–1.00)
GFR calc Af Amer: >60 mL/min (ref >60)
GFR calc non Af Amer: >60 mL/min (ref >60)
GLUCOSE: 91 mg/dL (ref 65–99)
Potassium: 3.6 mmol/L (ref 3.5–5.1)
Sodium: 134 mmol/L — ABNORMAL LOW (ref 135–145)

## 2015-09-28 LAB — WET PREP, GENITAL
Clue Cells Wet Prep HPF POC: NONE SEEN
Sperm: NONE SEEN
Trich, Wet Prep: NONE SEEN

## 2015-09-28 LAB — HCG, QUANTITATIVE, PREGNANCY: HCG, BETA CHAIN, QUANT, S: 211881 m[IU]/mL — AB (ref <5)

## 2015-09-28 LAB — TYPE AND SCREEN
ABO/RH(D): O POS
Antibody Screen: NEGATIVE

## 2015-09-28 LAB — CHLAMYDIA/NGC RT PCR (ARMC ONLY)
CHLAMYDIA TR: NOT DETECTED
N GONORRHOEAE: NOT DETECTED

## 2015-09-28 MED ORDER — DOXYLAMINE SUCCINATE (SLEEP) 25 MG PO TABS
25.0000 mg | ORAL_TABLET | Freq: Once | ORAL | Status: AC
  Administered 2015-09-28: 25 mg via ORAL
  Filled 2015-09-28: qty 1

## 2015-09-28 MED ORDER — VITAMIN B-6 50 MG PO TABS
50.0000 mg | ORAL_TABLET | Freq: Every day | ORAL | Status: DC
  Administered 2015-09-28: 50 mg via ORAL
  Filled 2015-09-28: qty 1

## 2015-09-28 MED ORDER — METOCLOPRAMIDE HCL 5 MG/ML IJ SOLN
10.0000 mg | Freq: Once | INTRAMUSCULAR | Status: AC
  Administered 2015-09-28: 10 mg via INTRAVENOUS

## 2015-09-28 MED ORDER — METOCLOPRAMIDE HCL 5 MG/ML IJ SOLN
INTRAMUSCULAR | Status: AC
  Administered 2015-09-28: 10 mg via INTRAVENOUS
  Filled 2015-09-28: qty 2

## 2015-09-28 MED ORDER — DOXYLAMINE-PYRIDOXINE 10-10 MG PO TBEC: 2.0000 | DELAYED_RELEASE_TABLET | Freq: Two times a day (BID) | ORAL | Status: DC

## 2015-09-28 MED ORDER — SODIUM CHLORIDE 0.9 % IV BOLUS (SEPSIS)
2000.0000 mL | Freq: Once | INTRAVENOUS | Status: AC
  Administered 2015-09-28: 2000 mL via INTRAVENOUS

## 2015-09-28 MED ORDER — METOCLOPRAMIDE HCL 10 MG PO TABS: 10.0000 mg | ORAL_TABLET | Freq: Three times a day (TID) | ORAL | Status: DC | PRN

## 2015-09-28 NOTE — ED Provider Notes (Signed)
[facility] Emergency Department Provider Note  ____________________________________________  Time seen: Approximately 11:07 AM  I have reviewed the triage vital signs and the nursing notes.   HISTORY  Chief Complaint Emesis and Abdominal Pain   HPI Kristy Reid is a 20 y.o. female G3P1011 currently at [redacted] weeks gestational age, iron deficiency anemia, depression, anxiety, and asthma who presents for evaluation of abdominal pain and vaginal discharge. Patient reports 3 days of abdominal cramping and vaginal discharge. She reports prior history of Trichomonas. She has established care for this current pregnancy and has had an ultrasound 2 weeks ago that showed a intrauterine pregnancy. This is patient's third pregnancy with the first one resulting in live birth of a full-term baby vaginally. Second pregnancy resulted in first trimester miscarriage. Patient endorses intermittent lower abdominal cramping for 3 days, moderate, nonradiating, associated with a white vaginal discharge. She also endorses burning sensation in her vagina and mild dysuria. Patient reports that she has had issues with nausea and vomiting during this pregnancy and hasn't been able to tolerate by mouth for the last 4 days.She is taking Zofran and Phenergan at home with no improvement of her vomiting. She denies any prior history of hyperemesis gravidarum. She is on prenatal vitamins. She reports to her OB took her off of iron a week ago due to her nausea and vomiting. She reports that she's been feeling lightheaded since yesterday. Also reports very mild vaginal spotting 2 days ago that has resolved.  Past Medical History  Diagnosis Date  . Asthma   . Anemia   . Anxiety   . Anemia   . Chronic back pain   . Depression   . PTSD (post-traumatic stress disorder)   . Insomnia     Patient Active Problem List   Diagnosis Date Noted  . Anemia 01/16/2015  . Dysmenorrhea 01/16/2015  . Dyspareunia  in female 01/16/2015  . Abnormal uterine bleeding 01/16/2015    Past Surgical History  Procedure Laterality Date  . Wrist sugery    . Bunionectomy Bilateral     feet    Current Outpatient Rx  Name  Route  Sig  Dispense  Refill  . albuterol (PROVENTIL HFA;VENTOLIN HFA) 108 (90 BASE) MCG/ACT inhaler   Inhalation   Inhale 2 puffs into the lungs every 6 (six) hours as needed for wheezing or shortness of breath.   1 Inhaler   2   . azithromycin (ZITHROMAX Z-PAK) 250 MG tablet      Take 2 tablets (500 mg) on  Day 1,  followed by 1 tablet (250 mg) once daily on Days 2 through 5.   6 each   0   . beclomethasone (QVAR) 40 MCG/ACT inhaler   Inhalation   Inhale 1 puff into the lungs 2 (two) times daily.         . clindamycin (CLEOCIN) 150 MG capsule   Oral   Take 3 capsules (450 mg total) by mouth 3 (three) times daily.   90 capsule   0   . cyclobenzaprine (FLEXERIL) 10 MG tablet   Oral   Take 1 tablet (10 mg total) by mouth 3 (three) times daily as needed for muscle spasms.   30 tablet   0   . docusate sodium (COLACE) 100 MG capsule   Oral   Take 1 capsule (100 mg total) by mouth 2 (two) times daily. Patient not taking: Reported on 08/31/2015   60 capsule   2   .  Doxylamine-Pyridoxine 10-10 MG TBEC   Oral   Take 2 tablets by mouth 2 (two) times daily.   60 tablet   1   . etonogestrel-ethinyl estradiol (NUVARING) 0.12-0.015 MG/24HR vaginal ring      Insert vaginally and leave in place for 3 consecutive weeks, then remove for 1 week. Patient not taking: Reported on 08/31/2015   1 each   12   . ferrous sulfate 325 (65 FE) MG tablet   Oral   Take 325 mg by mouth daily with breakfast.         . ibuprofen (ADVIL,MOTRIN) 800 MG tablet   Oral   Take 1 tablet (800 mg total) by mouth 3 (three) times daily. Patient not taking: Reported on 08/31/2015   30 tablet   1   . metoCLOPramide (REGLAN) 10 MG tablet   Oral   Take 1 tablet (10 mg total) by mouth every 8  (eight) hours as needed for nausea.   30 tablet   1   . predniSONE (DELTASONE) 10 MG tablet   Oral   Take 5 tablets (50 mg total) by mouth daily.   20 tablet   0   . promethazine (PHENERGAN) 25 MG suppository   Rectal   Place 1 suppository (25 mg total) rectally every 6 (six) hours as needed for nausea or vomiting.   12 each   0     Allergies Penicillins and Rocephin  Family History  Problem Relation Age of Onset  . Diabetes Mother   . Diabetes Maternal Grandmother   . Heart disease Maternal Grandmother   . Cancer Neg Hx   . Ovarian cancer Neg Hx     Social History Social History  Substance Use Topics  . Smoking status: Former Games developer  . Smokeless tobacco: Never Used  . Alcohol Use: No    Review of Systems  Constitutional: Negative for fever. + lightheadedness  Eyes: Negative for visual changes. ENT: Negative for sore throat. Cardiovascular: Negative for chest pain. Respiratory: Negative for shortness of breath. Gastrointestinal: + suprapubic cramping, vomiting, and nausea. No diarrhea. Genitourinary: + vaginal pain, discharge, dysuria Musculoskeletal: Negative for back pain. Skin: Negative for rash. Neurological: Negative for headaches, weakness or numbness.  ____________________________________________   PHYSICAL EXAM:  VITAL SIGNS: ED Triage Vitals  Enc Vitals Group     BP 09/28/15 1000 121/63 mmHg     Pulse Rate 09/28/15 1000 105     Resp 09/28/15 1000 20     Temp 09/28/15 1000 98.3 F (36.8 C)     Temp Source 09/28/15 1000 Oral     SpO2 09/28/15 1000 100 %     Weight 09/28/15 1000 167 lb (75.751 kg)     Height 09/28/15 1000 5\' 2"  (1.575 m)     Head Cir --      Peak Flow --      Pain Score 09/28/15 1000 8     Pain Loc --      Pain Edu? --      Excl. in GC? --     Constitutional: Alert and oriented. Well appearing and in no apparent distress. HEENT:      Head: Normocephalic and atraumatic.         Eyes: Conjunctivae are normal. Sclera  is non-icteric. EOMI. PERRL      Mouth/Throat: Mucous membranes are moist.       Neck: Supple with no signs of meningismus. Cardiovascular: Regular rate and rhythm. No murmurs, gallops, or rubs. 2+ symmetrical  distal pulses are present in all extremities. No JVD. Respiratory: Normal respiratory effort. Lungs are clear to auscultation bilaterally. No wheezes, crackles, or rhonchi.  Gastrointestinal: Soft, mild suprapubic ttp, and non distended with positive bowel sounds. No rebound or guarding. Genitourinary: No CVA tenderness. Pelvic exam: Normal external genitalia, no rashes or lesions. Thick white discharge with mild erythema of the vaginal canal. Os closed. No cervical motion tenderness.  No uterine or adnexal tenderness. No bleeding noted.  Musculoskeletal: Nontender with normal range of motion in all extremities. No edema, cyanosis, or erythema of extremities. Neurologic: Normal speech and language. Face is symmetric. Moving all extremities. No gross focal neurologic deficits are appreciated. Skin: Skin is warm, dry and intact. No rash noted. Psychiatric: Mood and affect are normal. Speech and behavior are normal.  ____________________________________________   LABS (all labs ordered are listed, but only abnormal results are displayed)  Labs Reviewed  WET PREP, GENITAL - Abnormal; Notable for the following:    Yeast Wet Prep HPF POC PRESENT (*)    WBC, Wet Prep HPF POC FEW (*)    All other components within normal limits  BASIC METABOLIC PANEL - Abnormal; Notable for the following:    Sodium 134 (*)    CO2 21 (*)    All other components within normal limits  CBC WITH DIFFERENTIAL/PLATELET - Abnormal; Notable for the following:    RDW 19.6 (*)    All other components within normal limits  URINALYSIS COMPLETEWITH MICROSCOPIC ([facility] ONLY) - Abnormal; Notable for the following:    Color, Urine YELLOW (*)    APPearance CLEAR (*)    Ketones, ur TRACE (*)    Protein, ur 30 (*)     Leukocytes, UA TRACE (*)    Squamous Epithelial / LPF 0-5 (*)    All other components within normal limits  HCG, QUANTITATIVE, PREGNANCY - Abnormal; Notable for the following:    hCG, Beta ChainMahalia Longest, Quant, S [id number] (*)    All other components within normal limits  CHLAMYDIA/NGC RT PCR ([facility] ONLY)  URINE CULTURE  TYPE AND SCREEN   ____________________________________________  EKG  none ____________________________________________  RADIOLOGY  none  ____________________________________________   PROCEDURES  Procedure(s) performed: None Critical Care performed:  None ____________________________________________   INITIAL IMPRESSION / ASSESSMENT AND PLAN / ED COURSE  20 y.o. female [id number] currently at 608 weeks gestational age, iron deficiency anemia, depression, anxiety, and asthma who presents for evaluation of abdominal cramping, vaginal discharge, vaginal pain, N/V and an headedness. Exam she is well appearing, in no distress, her vital signs show tachycardia with heart rate of 105, otherwise within normal limits, abdomen is soft with mild suprapubic tenderness, vaginal exam showing thick white discharge with mild erythema of the vaginal canal. Patient with moderate discomfort during exam. No CMT or adnexal tenderness. Differential diagnoses including STDs versus yeast infection versus BV. Patient with also nausea and vomiting that has been not responsive to Zofran and Phenergan. Now feeling dizzy. We'll IV hydrate, will start patient on pyridoxine and doxylamine. Will check labs. Bedside ultrasound showing an IUP with FHR 167.  _________________________ 1:26 PM on 09/28/2015 -----------------------------------------  Wet prep consistent with the vaginal candidiasis. Will discharge patient home with Monistat. She reports her nausea and vomiting have now resolved. She is tolerating by mouth. We'll discharge home on doxylamine and pyridoxine standing and Reglan when  necessary. Recommended evaluation by primary care doctor Monday. Discussed return precautions with patient.  Pertinent labs & imaging results that were available during my  care of the patient were reviewed by me and considered in my medical decision making (see chart for details).    I discussed my evaluation of the patient's symptoms, my clinical impression, and my proposed outpatient treatment plan with patient. We have discussed anticipatory guidance, scheduled follow-up, and careful return precautions. The patient expresses understanding and is comfortable with the discharge plan. All patient's questions were answered.   ____________________________________________   FINAL CLINICAL IMPRESSION(S) / ED DIAGNOSES  Final diagnoses:  Nausea and vomiting during pregnancy  Vaginal yeast infection      NEW MEDICATIONS STARTED DURING THIS VISIT:  New Prescriptions   DOXYLAMINE-PYRIDOXINE 10-10 MG TBEC    Take 2 tablets by mouth 2 (two) times daily.   METOCLOPRAMIDE (REGLAN) 10 MG TABLET    Take 1 tablet (10 mg total) by mouth every 8 (eight) hours as needed for nausea.     Note:  This document was prepared using Dragon voice recognition software and may include unintentional dictation errors.    Nita Sickle, MD 09/28/15 1327

## 2015-09-28 NOTE — ED Notes (Signed)
Fetal heart tones obtained by EDP

## 2015-09-28 NOTE — Discharge Instructions (Signed)
Use monistat 7 days for your yeast infection. Take doxylamine-pyridoxine daily as prescribed. Take reglan as needed if you continue to have nausea and vomiting. Follow up with your doctor on Monday.

## 2015-09-28 NOTE — ED Notes (Addendum)
EDP at bedside  

## 2015-09-28 NOTE — ED Notes (Signed)
Pt tolerated PO fluids

## 2015-09-28 NOTE — ED Notes (Signed)
Pt to ed with c/o nausea and vomiting and abd pain and cramping, denies bleeding but reports white, vaginal d/c and swelling to pelvic area.   Pt reports she is approx [redacted] weeks pregnant.

## 2015-09-28 NOTE — ED Notes (Signed)
States nausea, dizziness, vaginal discharge and pain, pt is [redacted] weeks pregnant, 3rd pregnancy, states some vaginal spotting

## 2015-09-28 NOTE — ED Notes (Signed)
Pt states hx of blood transfusion with previous pregnancy's, also states low iron, pt recently was taken off iron pills

## 2015-09-28 NOTE — ED Notes (Signed)
Pt given apple juice for PO challenge.

## 2015-09-29 LAB — URINE CULTURE: CULTURE: NO GROWTH

## 2015-10-04 LAB — OB RESULTS CONSOLE VARICELLA ZOSTER ANTIBODY, IGG: VARICELLA IGG: IMMUNE

## 2015-10-04 LAB — OB RESULTS CONSOLE HEPATITIS B SURFACE ANTIGEN: HEP B S AG: NEGATIVE

## 2015-10-04 LAB — OB RESULTS CONSOLE RUBELLA ANTIBODY, IGM: Rubella: IMMUNE

## 2015-10-04 LAB — OB RESULTS CONSOLE GC/CHLAMYDIA
Chlamydia: NEGATIVE
Gonorrhea: NEGATIVE

## 2015-10-04 LAB — OB RESULTS CONSOLE HIV ANTIBODY (ROUTINE TESTING): HIV: NONREACTIVE

## 2015-10-04 LAB — OB RESULTS CONSOLE RPR: RPR: NONREACTIVE

## 2015-10-04 LAB — OB RESULTS CONSOLE GBS: GBS: POSITIVE

## 2015-10-17 ENCOUNTER — Emergency Department
Admission: EM | Admit: 2015-10-17 | Discharge: 2015-10-17 | Disposition: A | Discharge status: Home / Self Care | Payer: Medicaid Other | Attending: Emergency Medicine | Admitting: Emergency Medicine

## 2015-10-17 ENCOUNTER — Encounter: Payer: Self-pay | Admitting: Emergency Medicine

## 2015-10-17 DIAGNOSIS — Z87891 Personal history of nicotine dependence: Secondary | ICD-10-CM | POA: Diagnosis not present

## 2015-10-17 DIAGNOSIS — O99351 Diseases of the nervous system complicating pregnancy, first trimester: Secondary | ICD-10-CM | POA: Diagnosis present

## 2015-10-17 DIAGNOSIS — Z79899 Other long term (current) drug therapy: Secondary | ICD-10-CM | POA: Insufficient documentation

## 2015-10-17 DIAGNOSIS — Z791 Long term (current) use of non-steroidal anti-inflammatories (NSAID): Secondary | ICD-10-CM | POA: Diagnosis not present

## 2015-10-17 DIAGNOSIS — O2311 Infections of bladder in pregnancy, first trimester: Secondary | ICD-10-CM | POA: Insufficient documentation

## 2015-10-17 DIAGNOSIS — N390 Urinary tract infection, site not specified: Secondary | ICD-10-CM

## 2015-10-17 DIAGNOSIS — Z3A10 10 weeks gestation of pregnancy: Secondary | ICD-10-CM | POA: Insufficient documentation

## 2015-10-17 DIAGNOSIS — O2341 Unspecified infection of urinary tract in pregnancy, first trimester: Secondary | ICD-10-CM | POA: Diagnosis not present

## 2015-10-17 DIAGNOSIS — R51 Headache: Secondary | ICD-10-CM | POA: Insufficient documentation

## 2015-10-17 DIAGNOSIS — J45909 Unspecified asthma, uncomplicated: Secondary | ICD-10-CM | POA: Insufficient documentation

## 2015-10-17 DIAGNOSIS — Z3491 Encounter for supervision of normal pregnancy, unspecified, first trimester: Secondary | ICD-10-CM

## 2015-10-17 DIAGNOSIS — R519 Headache, unspecified: Secondary | ICD-10-CM

## 2015-10-17 LAB — CBC
HEMATOCRIT: 35 % (ref 35.0–47.0)
HEMOGLOBIN: 11.6 g/dL — AB (ref 12.0–16.0)
MCH: 26.9 pg (ref 26.0–34.0)
MCHC: 33.3 g/dL (ref 32.0–36.0)
MCV: 80.9 fL (ref 80.0–100.0)
Platelets: 234 10*3/uL (ref 150–440)
RBC: 4.33 MIL/uL (ref 3.80–5.20)
RDW: 19.9 % — ABNORMAL HIGH (ref 11.5–14.5)
WBC: 6.5 10*3/uL (ref 3.6–11.0)

## 2015-10-17 LAB — COMPREHENSIVE METABOLIC PANEL WITH GFR
ALT: 12 U/L — ABNORMAL LOW (ref 14–54)
AST: 19 U/L (ref 15–41)
Albumin: 3.6 g/dL (ref 3.5–5.0)
Alkaline Phosphatase: 63 U/L (ref 38–126)
Anion gap: 7 (ref 5–15)
BUN: 9 mg/dL (ref 6–20)
CO2: 20 mmol/L — ABNORMAL LOW (ref 22–32)
Calcium: 9.4 mg/dL (ref 8.9–10.3)
Chloride: 106 mmol/L (ref 101–111)
Creatinine, Ser: 0.64 mg/dL (ref 0.44–1.00)
GFR calc Af Amer: >60 mL/min (ref >60)
GFR calc non Af Amer: >60 mL/min (ref >60)
Glucose, Bld: 111 mg/dL — ABNORMAL HIGH (ref 65–99)
Potassium: 3.6 mmol/L (ref 3.5–5.1)
Sodium: 133 mmol/L — ABNORMAL LOW (ref 135–145)
Total Bilirubin: 0.2 mg/dL — ABNORMAL LOW (ref 0.3–1.2)
Total Protein: 7 g/dL (ref 6.5–8.1)

## 2015-10-17 LAB — URINALYSIS COMPLETE WITH MICROSCOPIC (ARMC ONLY)
BACTERIA UA: NONE SEEN
BILIRUBIN URINE: NEGATIVE
GLUCOSE, UA: NEGATIVE mg/dL
HGB URINE DIPSTICK: NEGATIVE
Ketones, ur: NEGATIVE mg/dL
NITRITE: NEGATIVE
Protein, ur: 30 mg/dL — AB
Specific Gravity, Urine: 1.031 — ABNORMAL HIGH (ref 1.005–1.030)
pH: 6 (ref 5.0–8.0)

## 2015-10-17 LAB — POCT PREGNANCY, URINE: PREG TEST UR: POSITIVE — AB

## 2015-10-17 MED ORDER — METOCLOPRAMIDE HCL 5 MG/ML IJ SOLN
10.0000 mg | Freq: Once | INTRAMUSCULAR | Status: AC
  Administered 2015-10-17: 10 mg via INTRAVENOUS
  Filled 2015-10-17: qty 2

## 2015-10-17 MED ORDER — SODIUM CHLORIDE 0.9 % IV SOLN
Freq: Once | INTRAVENOUS | Status: AC
  Administered 2015-10-17: 16:00:00 via INTRAVENOUS

## 2015-10-17 MED ORDER — NITROFURANTOIN MONOHYD MACRO 100 MG PO CAPS: 100.0000 mg | ORAL_CAPSULE | Freq: Two times a day (BID) | ORAL | 0 refills | Status: DC

## 2015-10-17 MED ORDER — DIPHENHYDRAMINE HCL 50 MG/ML IJ SOLN
25.0000 mg | Freq: Once | INTRAMUSCULAR | Status: AC
  Administered 2015-10-17: 25 mg via INTRAVENOUS

## 2015-10-17 MED ORDER — METOCLOPRAMIDE HCL 10 MG PO TABS: 10.0000 mg | ORAL_TABLET | Freq: Three times a day (TID) | ORAL | 1 refills | Status: DC | PRN

## 2015-10-17 MED ORDER — DIPHENHYDRAMINE HCL 50 MG/ML IJ SOLN
INTRAMUSCULAR | Status: AC
  Filled 2015-10-17: qty 1

## 2015-10-17 NOTE — ED Triage Notes (Signed)
Patient presents to the ED with headache since yesterday.  Patient states she tried to take a nap around 12:30 and felt like she never went to sleep but her friend felt like the patient had "passed out."  Patient states, "I could still hear everything but I couldn't see."  Patient reports slightly blurry vision at this time.  Patient ambulatory to triage without difficulty.  Patient is speaking in full sentences.  Patient reports feeling tired.  Patient reports being [redacted] weeks pregnant and is followed by [facility].

## 2015-10-17 NOTE — ED Notes (Addendum)
See triage note..the patient c/o h/a since yesterday w/ no relief from tylenol.  Pt sts that she "laid down today and I could hear everything but ike I couldn't open my eyes or move".  Pt alert/oriented.  C/o dizziness, able to ambulate/talk w/o difficulty. Denies CP, SOB, vomiting or diarrhea.

## 2015-10-17 NOTE — ED Notes (Signed)
Unable to check visual acuity at this time because covering up her

## 2015-10-17 NOTE — ED Provider Notes (Signed)
[facility] Emergency Department Provider Note        Time seen: ----------------------------------------- 3:36 PM on 10/17/2015 -----------------------------------------    I have reviewed the triage vital signs and the nursing notes.   HISTORY  Chief Complaint Migraine    HPI Kristy Reid is a 20 y.o. female who presents to ER with headache since yesterday. Patient states she tried taking a nap around 12:30 and felt like she never went to sleep but her friend felt like she had passed out. She states she could hear everything but she couldn't see. She reports slightly blurred vision. Light does bother her right now there is reports feeling tired, no she is an insomniac and doesn't sleep much. She states she has had insomnia with previous pregnancies. She thinks she is around [redacted] weeks pregnant.   Past Medical History:  Diagnosis Date  . Anemia   . Anemia   . Anxiety   . Asthma   . Chronic back pain   . Depression   . Insomnia   . PTSD (post-traumatic stress disorder)     Patient Active Problem List   Diagnosis Date Noted  . Anemia 01/16/2015  . Dysmenorrhea 01/16/2015  . Dyspareunia in female 01/16/2015  . Abnormal uterine bleeding 01/16/2015    Past Surgical History:  Procedure Laterality Date  . BUNIONECTOMY Bilateral    feet  . wrist sugery      Allergies Penicillins and Rocephin [ceftriaxone]  Social History Social History  Substance Use Topics  . Smoking status: Former Games developer  . Smokeless tobacco: Never Used  . Alcohol use No    Review of Systems Constitutional: Negative for fever. Eyes: positive for light sensitivity, slightly blurry vision ENT: Negative for sore throat. Cardiovascular: Negative for chest pain. Respiratory: Negative for shortness of breath. Gastrointestinal: Negative for abdominal pain, vomiting and diarrhea. Genitourinary: Negative for dysuria. Musculoskeletal: Negative for back pain. Skin:  Negative for rash. Neurological: Positive for headache  10-point ROS otherwise negative.  ____________________________________________   PHYSICAL EXAM:  VITAL SIGNS: ED Triage Vitals  Enc Vitals Group     BP 10/17/15 1436 123/68     Pulse Rate 10/17/15 1436 99     Resp 10/17/15 1436 18     Temp 10/17/15 1436 98.4 F (36.9 C)     Temp Source 10/17/15 1436 Oral     SpO2 10/17/15 1436 99 %     Weight 10/17/15 1436 165 lb (74.8 kg)     Height 10/17/15 1436  (1.575 m)     Head Circumference --      Peak Flow --      Pain Score 10/17/15 1437 7     Pain Loc --      Pain Edu? --      Excl. in GC? --     Constitutional: Alert and oriented. Well appearing and in no distress. Eyes: Conjunctivae are normal. PERRL. Normal extraocular movements. Mild photophobia ENT   Head: Normocephalic and atraumatic.   Nose: No congestion/rhinnorhea.   Mouth/Throat: Mucous membranes are moist.   Neck: No stridor. Cardiovascular: Normal rate, regular rhythm. No murmurs, rubs, or gallops. Respiratory: Normal respiratory effort without tachypnea nor retractions. Breath sounds are clear and equal bilaterally. No wheezes/rales/rhonchi. Gastrointestinal: Soft and nontender. Normal bowel sounds Musculoskeletal: Nontender with normal range of motion in all extremities. No lower extremity tenderness nor edema. Neurologic:  Normal speech and language. No gross focal neurologic deficits are appreciated.  Skin:  Skin is warm,  dry and intact. No rash noted. Psychiatric: Mood and affect are normal. Speech and behavior are normal.  ____________________________________________  ED COURSE:  Pertinent labs & imaging results that were available during my care of the patient were reviewed by me and considered in my medical decision making (see chart for details). Clinical Course  Patient is in no acute distress, headache is likely from sleep deprivation. I will give IV fluids, Reglan and  Benadryl.  Procedures ____________________________________________   LABS (pertinent positives/negatives)  Labs Reviewed  CBC - Abnormal; Notable for the following:       Result Value   Hemoglobin 11.6 (*)    RDW 19.9 (*)    All other components within normal limits  COMPREHENSIVE METABOLIC PANEL - Abnormal; Notable for the following:    Sodium 133 (*)    CO2 20 (*)    Glucose, Bld 111 (*)    ALT 12 (*)    Total Bilirubin 0.2 (*)    All other components within normal limits  URINALYSIS COMPLETEWITH MICROSCOPIC ([facility] ONLY) - Abnormal; Notable for the following:    Color, Urine AMBER (*)    APPearance CLOUDY (*)    Specific Gravity, Urine 1.031 (*)    Protein, ur 30 (*)    Leukocytes, UA 2+ (*)    Squamous Epithelial / LPF 6-30 (*)    All other components within normal limits  POCT PREGNANCY, URINE - Abnormal; Notable for the following:    Preg Test, Ur POSITIVE (*)    All other components within normal limits  POC URINE PREG, ED   ____________________________________________  FINAL ASSESSMENT AND PLAN  Headache, first trimester pregnancy, Cystitis  Plan: Patient with labs and imaging as dictated above. Patient is currently feeling better, she'll be placed on Macrobid for UTI. I will advise Unisom for sleep. I will prescribe Reglan as needed for nausea and headache.   Emily Filbert, MD   Note: This dictation was prepared with Dragon dictation. Any transcriptional errors that result from this process are unintentional    Emily Filbert, MD 10/17/15 [phone]

## 2015-11-07 ENCOUNTER — Encounter: Payer: Self-pay | Admitting: Emergency Medicine

## 2015-11-07 ENCOUNTER — Emergency Department
Admission: EM | Admit: 2015-11-07 | Discharge: 2015-11-07 | Disposition: A | Discharge status: Home / Self Care | Payer: Medicaid Other | Attending: Emergency Medicine | Admitting: Emergency Medicine

## 2015-11-07 DIAGNOSIS — A599 Trichomoniasis, unspecified: Secondary | ICD-10-CM

## 2015-11-07 DIAGNOSIS — O2391 Unspecified genitourinary tract infection in pregnancy, first trimester: Secondary | ICD-10-CM | POA: Insufficient documentation

## 2015-11-07 DIAGNOSIS — J45909 Unspecified asthma, uncomplicated: Secondary | ICD-10-CM | POA: Diagnosis not present

## 2015-11-07 DIAGNOSIS — O200 Threatened abortion: Secondary | ICD-10-CM | POA: Diagnosis not present

## 2015-11-07 DIAGNOSIS — Z79899 Other long term (current) drug therapy: Secondary | ICD-10-CM | POA: Insufficient documentation

## 2015-11-07 DIAGNOSIS — O99331 Smoking (tobacco) complicating pregnancy, first trimester: Secondary | ICD-10-CM | POA: Diagnosis not present

## 2015-11-07 DIAGNOSIS — R109 Unspecified abdominal pain: Secondary | ICD-10-CM | POA: Diagnosis present

## 2015-11-07 DIAGNOSIS — O26891 Other specified pregnancy related conditions, first trimester: Secondary | ICD-10-CM | POA: Insufficient documentation

## 2015-11-07 DIAGNOSIS — N390 Urinary tract infection, site not specified: Secondary | ICD-10-CM

## 2015-11-07 DIAGNOSIS — F17200 Nicotine dependence, unspecified, uncomplicated: Secondary | ICD-10-CM | POA: Diagnosis not present

## 2015-11-07 DIAGNOSIS — Z3A13 13 weeks gestation of pregnancy: Secondary | ICD-10-CM | POA: Insufficient documentation

## 2015-11-07 LAB — WET PREP, GENITAL
CLUE CELLS WET PREP: NONE SEEN
Sperm: NONE SEEN
Yeast Wet Prep HPF POC: NONE SEEN

## 2015-11-07 LAB — CBC
HCT: 33.8 % — ABNORMAL LOW (ref 35.0–47.0)
HEMOGLOBIN: 11.6 g/dL — AB (ref 12.0–16.0)
MCH: 27.5 pg (ref 26.0–34.0)
MCHC: 34.3 g/dL (ref 32.0–36.0)
MCV: 80.2 fL (ref 80.0–100.0)
Platelets: 246 10*3/uL (ref 150–440)
RBC: 4.21 MIL/uL (ref 3.80–5.20)
RDW: 20.6 % — ABNORMAL HIGH (ref 11.5–14.5)
WBC: 7.7 10*3/uL (ref 3.6–11.0)

## 2015-11-07 LAB — CHLAMYDIA/NGC RT PCR (ARMC ONLY)
CHLAMYDIA TR: NOT DETECTED
N GONORRHOEAE: NOT DETECTED

## 2015-11-07 LAB — URINALYSIS COMPLETE WITH MICROSCOPIC (ARMC ONLY)
Bacteria, UA: NONE SEEN
Bilirubin Urine: NEGATIVE
Glucose, UA: NEGATIVE mg/dL
HGB URINE DIPSTICK: NEGATIVE
Nitrite: NEGATIVE
PH: 7 (ref 5.0–8.0)
PROTEIN: 30 mg/dL — AB
SPECIFIC GRAVITY, URINE: 1.023 (ref 1.005–1.030)

## 2015-11-07 LAB — COMPREHENSIVE METABOLIC PANEL
ALT: 11 U/L — ABNORMAL LOW (ref 14–54)
ANION GAP: 9 (ref 5–15)
AST: 17 U/L (ref 15–41)
Albumin: 3.7 g/dL (ref 3.5–5.0)
Alkaline Phosphatase: 70 U/L (ref 38–126)
BUN: 8 mg/dL (ref 6–20)
CALCIUM: 9.8 mg/dL (ref 8.9–10.3)
CHLORIDE: 106 mmol/L (ref 101–111)
CO2: 20 mmol/L — AB (ref 22–32)
Creatinine, Ser: 0.65 mg/dL (ref 0.44–1.00)
GFR calc non Af Amer: >60 mL/min (ref >60)
Glucose, Bld: 89 mg/dL (ref 65–99)
Potassium: 3.9 mmol/L (ref 3.5–5.1)
SODIUM: 135 mmol/L (ref 135–145)
Total Bilirubin: 0.3 mg/dL (ref 0.3–1.2)
Total Protein: 7.5 g/dL (ref 6.5–8.1)

## 2015-11-07 LAB — HCG, QUANTITATIVE, PREGNANCY: HCG, BETA CHAIN, QUANT, S: 126407 m[IU]/mL — AB (ref <5)

## 2015-11-07 LAB — LIPASE, BLOOD: LIPASE: 27 U/L (ref 11–51)

## 2015-11-07 MED ORDER — ONDANSETRON 4 MG PO TBDP
4.0000 mg | ORAL_TABLET | Freq: Once | ORAL | Status: AC
  Administered 2015-11-07: 4 mg via ORAL
  Filled 2015-11-07: qty 1

## 2015-11-07 MED ORDER — METRONIDAZOLE 500 MG PO TABS
2000.0000 mg | ORAL_TABLET | Freq: Once | ORAL | Status: AC
  Administered 2015-11-07: 2000 mg via ORAL
  Filled 2015-11-07: qty 4

## 2015-11-07 MED ORDER — NITROFURANTOIN MONOHYD MACRO 100 MG PO CAPS
100.0000 mg | ORAL_CAPSULE | Freq: Once | ORAL | Status: AC
  Administered 2015-11-07: 100 mg via ORAL
  Filled 2015-11-07: qty 1

## 2015-11-07 MED ORDER — ACETAMINOPHEN 500 MG PO TABS
1000.0000 mg | ORAL_TABLET | Freq: Once | ORAL | Status: AC
  Administered 2015-11-07: 1000 mg via ORAL
  Filled 2015-11-07: qty 2

## 2015-11-07 MED ORDER — NITROFURANTOIN MONOHYD MACRO 100 MG PO CAPS: 100.0000 mg | ORAL_CAPSULE | Freq: Two times a day (BID) | ORAL | 0 refills | Status: DC
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
## 2015-11-07 NOTE — Discharge Instructions (Signed)
Take macrobid twice daily for a week for UTI.   You may be having a miscarriage and may have some spotting.   Call [facility] for appointment next week   Return to ER if you have severe abdominal pain, a lot of clots or tissue from vagina.

## 2015-11-08 ENCOUNTER — Emergency Department: Payer: Medicaid Other

## 2015-11-08 ENCOUNTER — Encounter: Payer: Self-pay | Admitting: Emergency Medicine

## 2015-11-08 ENCOUNTER — Emergency Department
Admission: EM | Admit: 2015-11-08 | Discharge: 2015-11-08 | Disposition: A | Discharge status: Home / Self Care | Payer: Medicaid Other | Attending: Student | Admitting: Student

## 2015-11-08 DIAGNOSIS — F17200 Nicotine dependence, unspecified, uncomplicated: Secondary | ICD-10-CM | POA: Diagnosis not present

## 2015-11-08 DIAGNOSIS — Z791 Long term (current) use of non-steroidal anti-inflammatories (NSAID): Secondary | ICD-10-CM | POA: Insufficient documentation

## 2015-11-08 DIAGNOSIS — Z79899 Other long term (current) drug therapy: Secondary | ICD-10-CM | POA: Diagnosis not present

## 2015-11-08 DIAGNOSIS — Z3A14 14 weeks gestation of pregnancy: Secondary | ICD-10-CM | POA: Insufficient documentation

## 2015-11-08 DIAGNOSIS — J45909 Unspecified asthma, uncomplicated: Secondary | ICD-10-CM | POA: Diagnosis not present

## 2015-11-08 DIAGNOSIS — R0789 Other chest pain: Secondary | ICD-10-CM | POA: Insufficient documentation

## 2015-11-08 DIAGNOSIS — O9A211 Injury, poisoning and certain other consequences of external causes complicating pregnancy, first trimester: Secondary | ICD-10-CM | POA: Insufficient documentation

## 2015-11-08 DIAGNOSIS — R079 Chest pain, unspecified: Secondary | ICD-10-CM

## 2015-11-08 DIAGNOSIS — Y999 Unspecified external cause status: Secondary | ICD-10-CM | POA: Insufficient documentation

## 2015-11-08 DIAGNOSIS — R102 Pelvic and perineal pain: Secondary | ICD-10-CM | POA: Diagnosis not present

## 2015-11-08 DIAGNOSIS — Y9301 Activity, walking, marching and hiking: Secondary | ICD-10-CM | POA: Insufficient documentation

## 2015-11-08 DIAGNOSIS — O99331 Smoking (tobacco) complicating pregnancy, first trimester: Secondary | ICD-10-CM | POA: Insufficient documentation

## 2015-11-08 DIAGNOSIS — W108XXA Fall (on) (from) other stairs and steps, initial encounter: Secondary | ICD-10-CM | POA: Diagnosis not present

## 2015-11-08 DIAGNOSIS — S3991XA Unspecified injury of abdomen, initial encounter: Secondary | ICD-10-CM | POA: Diagnosis not present

## 2015-11-08 DIAGNOSIS — R109 Unspecified abdominal pain: Secondary | ICD-10-CM

## 2015-11-08 DIAGNOSIS — Y929 Unspecified place or not applicable: Secondary | ICD-10-CM | POA: Diagnosis not present

## 2015-11-08 LAB — COMPREHENSIVE METABOLIC PANEL
ALT: 10 U/L — AB (ref 14–54)
ANION GAP: 7 (ref 5–15)
AST: 17 U/L (ref 15–41)
Albumin: 3.5 g/dL (ref 3.5–5.0)
Alkaline Phosphatase: 66 U/L (ref 38–126)
BUN: 10 mg/dL (ref 6–20)
CHLORIDE: 107 mmol/L (ref 101–111)
CO2: 21 mmol/L — AB (ref 22–32)
CREATININE: 0.7 mg/dL (ref 0.44–1.00)
Calcium: 9.1 mg/dL (ref 8.9–10.3)
GFR calc non Af Amer: >60 mL/min (ref >60)
Glucose, Bld: 89 mg/dL (ref 65–99)
Potassium: 3.8 mmol/L (ref 3.5–5.1)
SODIUM: 135 mmol/L (ref 135–145)
Total Bilirubin: <0.1 mg/dL — ABNORMAL LOW (ref 0.3–1.2)
Total Protein: 7.3 g/dL (ref 6.5–8.1)

## 2015-11-08 LAB — CBC WITH DIFFERENTIAL/PLATELET
BASOS ABS: 0 10*3/uL (ref 0–0.1)
Basophils Relative: 1 %
Eosinophils Absolute: 0.1 10*3/uL (ref 0–0.7)
Eosinophils Relative: 2 %
HEMATOCRIT: 34.1 % — AB (ref 35.0–47.0)
HEMOGLOBIN: 11.4 g/dL — AB (ref 12.0–16.0)
LYMPHS PCT: 31 %
Lymphs Abs: 2.4 10*3/uL (ref 1.0–3.6)
MCH: 27.5 pg (ref 26.0–34.0)
MCHC: 33.5 g/dL (ref 32.0–36.0)
MCV: 82 fL (ref 80.0–100.0)
MONO ABS: 0.9 10*3/uL (ref 0.2–0.9)
Monocytes Relative: 13 %
NEUTROS ABS: 4.1 10*3/uL (ref 1.4–6.5)
NEUTROS PCT: 53 %
Platelets: 247 10*3/uL (ref 150–440)
RBC: 4.16 MIL/uL (ref 3.80–5.20)
RDW: 20.1 % — AB (ref 11.5–14.5)
WBC: 7.5 10*3/uL (ref 3.6–11.0)

## 2015-11-08 LAB — URINALYSIS COMPLETE WITH MICROSCOPIC (ARMC ONLY)
Bilirubin Urine: NEGATIVE
Glucose, UA: NEGATIVE mg/dL
HGB URINE DIPSTICK: NEGATIVE
NITRITE: NEGATIVE
PH: 6 (ref 5.0–8.0)
PROTEIN: 100 mg/dL — AB
SPECIFIC GRAVITY, URINE: 1.029 (ref 1.005–1.030)

## 2015-11-08 LAB — LIPASE, BLOOD: Lipase: 28 U/L (ref 11–51)

## 2015-11-08 LAB — HCG, QUANTITATIVE, PREGNANCY: HCG, BETA CHAIN, QUANT, S: 123899 m[IU]/mL — AB (ref <5)

## 2015-11-08 MED ORDER — ACETAMINOPHEN 500 MG PO TABS
1000.0000 mg | ORAL_TABLET | Freq: Once | ORAL | Status: AC
  Administered 2015-11-08: 1000 mg via ORAL
  Filled 2015-11-08: qty 2

## 2015-11-08 MED ORDER — ACETAMINOPHEN 500 MG PO TABS: 1000.0000 mg | ORAL_TABLET | Freq: Once | ORAL | Status: DC
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
## 2015-11-08 NOTE — ED Notes (Signed)
Assisted with pelvic exam.

## 2015-11-08 NOTE — ED Notes (Signed)
Pt called out to report nose bleed. RN applied nasal clamp and ice bag to nose.

## 2015-11-08 NOTE — ED Notes (Signed)
MD Inocencio HomesGayle informed of nosebleed. MD Inocencio HomesGayle to bedside
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
## 2015-11-08 NOTE — ED Notes (Signed)
Reviewed d/c instructions, follow-up care with pt. Pt verbalized understanding 

## 2015-11-08 NOTE — ED Triage Notes (Signed)
[facility] for [facility]. Patient states that she fell down the steps today landing on her abdomen. Patient states that she is currently [redacted] weeks pregnant. [facility]. Patient also reports spotting and abdominal cramping

## 2015-11-13 ENCOUNTER — Encounter: Payer: Self-pay | Admitting: Intensive Care

## 2015-11-13 ENCOUNTER — Emergency Department
Admission: EM | Admit: 2015-11-13 | Discharge: 2015-11-14 | Disposition: A | Discharge status: Other Acute Inpatient Hospital | Payer: Medicaid Other | Attending: Emergency Medicine | Admitting: Emergency Medicine

## 2015-11-13 DIAGNOSIS — J45909 Unspecified asthma, uncomplicated: Secondary | ICD-10-CM | POA: Insufficient documentation

## 2015-11-13 DIAGNOSIS — Z3A14 14 weeks gestation of pregnancy: Secondary | ICD-10-CM | POA: Insufficient documentation

## 2015-11-13 DIAGNOSIS — J029 Acute pharyngitis, unspecified: Secondary | ICD-10-CM | POA: Diagnosis present

## 2015-11-13 DIAGNOSIS — Z791 Long term (current) use of non-steroidal anti-inflammatories (NSAID): Secondary | ICD-10-CM | POA: Diagnosis not present

## 2015-11-13 DIAGNOSIS — O98511 Other viral diseases complicating pregnancy, first trimester: Secondary | ICD-10-CM | POA: Diagnosis not present

## 2015-11-13 DIAGNOSIS — Z79899 Other long term (current) drug therapy: Secondary | ICD-10-CM | POA: Insufficient documentation

## 2015-11-13 DIAGNOSIS — B349 Viral infection, unspecified: Secondary | ICD-10-CM | POA: Diagnosis not present

## 2015-11-13 DIAGNOSIS — F332 Major depressive disorder, recurrent severe without psychotic features: Secondary | ICD-10-CM | POA: Diagnosis not present

## 2015-11-13 DIAGNOSIS — Z792 Long term (current) use of antibiotics: Secondary | ICD-10-CM | POA: Insufficient documentation

## 2015-11-13 DIAGNOSIS — Z87891 Personal history of nicotine dependence: Secondary | ICD-10-CM | POA: Insufficient documentation

## 2015-11-13 LAB — URINALYSIS COMPLETE WITH MICROSCOPIC (ARMC ONLY)
BILIRUBIN URINE: NEGATIVE
Bacteria, UA: NONE SEEN
Glucose, UA: NEGATIVE mg/dL
Hgb urine dipstick: NEGATIVE
Nitrite: NEGATIVE
Protein, ur: 30 mg/dL — AB
Specific Gravity, Urine: 1.025 (ref 1.005–1.030)
pH: 6 (ref 5.0–8.0)

## 2015-11-13 LAB — CBC WITH DIFFERENTIAL/PLATELET
BASOS ABS: 0 10*3/uL (ref 0–0.1)
Basophils Relative: 1 %
EOS ABS: 0.1 10*3/uL (ref 0–0.7)
EOS PCT: 1 %
HCT: 35.4 % (ref 35.0–47.0)
Hemoglobin: 11.9 g/dL — ABNORMAL LOW (ref 12.0–16.0)
LYMPHS PCT: 42 %
Lymphs Abs: 2.8 10*3/uL (ref 1.0–3.6)
MCH: 27.5 pg (ref 26.0–34.0)
MCHC: 33.7 g/dL (ref 32.0–36.0)
MCV: 81.6 fL (ref 80.0–100.0)
MONO ABS: 0.5 10*3/uL (ref 0.2–0.9)
Monocytes Relative: 7 %
Neutro Abs: 3.3 10*3/uL (ref 1.4–6.5)
Neutrophils Relative %: 49 %
PLATELETS: 246 10*3/uL (ref 150–440)
RBC: 4.34 MIL/uL (ref 3.80–5.20)
RDW: 20.3 % — AB (ref 11.5–14.5)
WBC: 6.7 10*3/uL (ref 3.6–11.0)

## 2015-11-13 LAB — URINE DRUG SCREEN, QUALITATIVE (ARMC ONLY)
AMPHETAMINES, UR SCREEN: NOT DETECTED
BENZODIAZEPINE, UR SCRN: NOT DETECTED
Barbiturates, Ur Screen: NOT DETECTED
CANNABINOID 50 NG, UR ~~LOC~~: NOT DETECTED
Cocaine Metabolite,Ur ~~LOC~~: NOT DETECTED
MDMA (Ecstasy)Ur Screen: NOT DETECTED
Methadone Scn, Ur: NOT DETECTED
Opiate, Ur Screen: NOT DETECTED
PHENCYCLIDINE (PCP) UR S: NOT DETECTED
Tricyclic, Ur Screen: NOT DETECTED

## 2015-11-13 LAB — COMPREHENSIVE METABOLIC PANEL
ALK PHOS: 58 U/L (ref 38–126)
ALT: 9 U/L — AB (ref 14–54)
AST: 15 U/L (ref 15–41)
Albumin: 3.6 g/dL (ref 3.5–5.0)
Anion gap: 9 (ref 5–15)
BUN: 8 mg/dL (ref 6–20)
CALCIUM: 9.2 mg/dL (ref 8.9–10.3)
CHLORIDE: 105 mmol/L (ref 101–111)
CO2: 20 mmol/L — ABNORMAL LOW (ref 22–32)
CREATININE: 0.69 mg/dL (ref 0.44–1.00)
Glucose, Bld: 80 mg/dL (ref 65–99)
Potassium: 3.9 mmol/L (ref 3.5–5.1)
Sodium: 134 mmol/L — ABNORMAL LOW (ref 135–145)
TOTAL PROTEIN: 7.5 g/dL (ref 6.5–8.1)
Total Bilirubin: 0.2 mg/dL — ABNORMAL LOW (ref 0.3–1.2)

## 2015-11-13 LAB — ACETAMINOPHEN LEVEL: Acetaminophen (Tylenol), Serum: <10 ug/mL — ABNORMAL LOW (ref 10–30)

## 2015-11-13 LAB — SALICYLATE LEVEL: Salicylate Lvl: <4 mg/dL (ref 2.8–30.0)

## 2015-11-13 LAB — POCT RAPID STREP A: STREPTOCOCCUS, GROUP A SCREEN (DIRECT): NEGATIVE

## 2015-11-13 LAB — HCG, QUANTITATIVE, PREGNANCY: HCG, BETA CHAIN, QUANT, S: 111537 m[IU]/mL — AB (ref <5)

## 2015-11-13 LAB — ETHANOL

## 2015-11-13 MED ORDER — NITROFURANTOIN MONOHYD MACRO 100 MG PO CAPS
100.0000 mg | ORAL_CAPSULE | Freq: Once | ORAL | Status: AC
  Administered 2015-11-14: 100 mg via ORAL
  Filled 2015-11-13: qty 1

## 2015-11-13 MED ORDER — SERTRALINE HCL 50 MG PO TABS
50.0000 mg | ORAL_TABLET | Freq: Every day | ORAL | Status: DC
  Administered 2015-11-13 – 2015-11-14 (×2): 50 mg via ORAL
  Filled 2015-11-13 (×2): qty 1

## 2015-11-13 MED ORDER — ACETAMINOPHEN 325 MG PO TABS
650.0000 mg | ORAL_TABLET | Freq: Once | ORAL | Status: AC
  Administered 2015-11-13: 650 mg via ORAL
  Filled 2015-11-13: qty 2

## 2015-11-13 MED ORDER — PROMETHAZINE HCL 25 MG PO TABS: 25.0000 mg | ORAL_TABLET | Freq: Four times a day (QID) | ORAL | 0 refills | Status: DC | PRN

## 2015-11-13 MED ORDER — PROMETHAZINE HCL 25 MG PO TABS
25.0000 mg | ORAL_TABLET | Freq: Once | ORAL | Status: AC
  Administered 2015-11-13: 25 mg via ORAL
  Filled 2015-11-13: qty 1

## 2015-11-13 MED ORDER — PRENATAL MULTIVITAMIN CH
1.0000 | ORAL_TABLET | Freq: Every day | ORAL | Status: DC
  Administered 2015-11-13 – 2015-11-14 (×2): 1 via ORAL
  Filled 2015-11-13 (×2): qty 1

## 2015-11-13 NOTE — ED Provider Notes (Signed)
[facility] Emergency Department Provider Note   ____________________________________________   None    (approximate)  I have reviewed the triage vital signs and the nursing notes.   HISTORY  Chief Complaint Generalized Body Aches and Sore Throat    HPI Kristy Reid is a 20 y.o. female patient complaining of generalized body aches and sore throat for 2 days. Patient stated no relief with taking Tylenol and over-the-counter cough medicine. Patient also states the chest is sore and increased with deep inspirations. Patient is currently 14 weeks' gestation requests refills of Phenergan for her nausea.   Past Medical History:  Diagnosis Date  . Anemia   . Anemia   . Anxiety   . Asthma   . Chronic back pain   . Depression   . Insomnia   . PTSD (post-traumatic stress disorder)     Patient Active Problem List   Diagnosis Date Noted  . Anemia 01/16/2015  . Dysmenorrhea 01/16/2015  . Dyspareunia in female 01/16/2015  . Abnormal uterine bleeding 01/16/2015    Past Surgical History:  Procedure Laterality Date  . BUNIONECTOMY Bilateral    feet  . wrist sugery      Prior to Admission medications   Medication Sig Start Date End Date Taking? Authorizing Provider  albuterol (PROVENTIL HFA;VENTOLIN HFA) 108 (90 BASE) MCG/ACT inhaler Inhale 2 puffs into the lungs every 6 (six) hours as needed for wheezing or shortness of breath. 01/06/15   Sharyn Creamer, MD  azithromycin (ZITHROMAX Z-PAK) 250 MG tablet Take 2 tablets (500 mg) on  Day 1,  followed by 1 tablet (250 mg) once daily on Days 2 through 5. 08/31/15   Sharman Cheek, MD  beclomethasone (QVAR) 40 MCG/ACT inhaler Inhale 1 puff into the lungs 2 (two) times daily.    Historical Provider, MD  clindamycin (CLEOCIN) 150 MG capsule Take 3 capsules (450 mg total) by mouth 3 (three) times daily. 08/31/15   Sharman Cheek, MD  cyclobenzaprine (FLEXERIL) 10 MG tablet Take 1 tablet (10 mg total) by mouth 3  (three) times daily as needed for muscle spasms. 08/09/14   Chinita Pester, FNP  docusate sodium (COLACE) 100 MG capsule Take 1 capsule (100 mg total) by mouth 2 (two) times daily. Patient not taking: Reported on 08/31/2015 02/15/15 02/15/16  Minna Antis, MD  Doxylamine-Pyridoxine 10-10 MG TBEC Take 2 tablets by mouth 2 (two) times daily. 09/28/15   Nita Sickle, MD  etonogestrel-ethinyl estradiol (NUVARING) 0.12-0.015 MG/24HR vaginal ring Insert vaginally and leave in place for 3 consecutive weeks, then remove for 1 week. Patient not taking: Reported on 08/31/2015 01/16/15   Prentice Docker Defrancesco, MD  ferrous sulfate 325 (65 FE) MG tablet Take 325 mg by mouth daily with breakfast.    Historical Provider, MD  ibuprofen (ADVIL,MOTRIN) 800 MG tablet Take 1 tablet (800 mg total) by mouth 3 (three) times daily. Patient not taking: Reported on 08/31/2015 01/16/15   Prentice Docker Defrancesco, MD  metoCLOPramide (REGLAN) 10 MG tablet Take 1 tablet (10 mg total) by mouth every 8 (eight) hours as needed for nausea. 09/28/15 09/27/16  Nita Sickle, MD  metoCLOPramide (REGLAN) 10 MG tablet Take 1 tablet (10 mg total) by mouth every 8 (eight) hours as needed for nausea or vomiting. 10/17/15   Emily Filbert, MD  nitrofurantoin, macrocrystal-monohydrate, (MACROBID) 100 MG capsule Take 1 capsule (100 mg total) by mouth 2 (two) times daily. X 7 days 11/07/15   Charlynne Pander, MD  predniSONE Lorenza Chick)  10 MG tablet Take 5 tablets (50 mg total) by mouth daily. 08/29/15   Governor Rooks, MD  promethazine (PHENERGAN) 25 MG suppository Place 1 suppository (25 mg total) rectally every 6 (six) hours as needed for nausea or vomiting. 08/29/15   Governor Rooks, MD  promethazine (PHENERGAN) 25 MG tablet Take 1 tablet (25 mg total) by mouth every 6 (six) hours as needed for nausea or vomiting. 11/13/15   Joni Reining, PA-C    Allergies Penicillins and Rocephin [ceftriaxone]  Family History  Problem Relation Age of Onset   . Diabetes Mother   . Diabetes Maternal Grandmother   . Heart disease Maternal Grandmother   . Cancer Neg Hx   . Ovarian cancer Neg Hx     Social History Social History  Substance Use Topics  . Smoking status: Former Smoker    Quit date: 08/13/2015  . Smokeless tobacco: Never Used  . Alcohol use No    Review of Systems Constitutional: No fever/chills. Body ache Eyes: No visual changes. ZOX:WRUE throat. Cardiovascular: Denies chest pain. Respiratory: Denies shortness of breath. Gastrointestinal: No abdominal pain.  No nausea, no vomiting.  No diarrhea.  No constipation. Genitourinary: Negative for dysuria. Musculoskeletal: Negative for back pain. Skin: Negative for rash. Neurological: Negative for headaches, focal weakness or numbness. Psychiatric:Anxiety, depression, posttraumatic stress disorder. Hematological/Lymphatic:Anemia  ____________________________________________   PHYSICAL EXAM:  VITAL SIGNS: ED Triage Vitals  Enc Vitals Group     BP      Pulse      Resp      Temp      Temp src      SpO2      Weight      Height      Head Circumference      Peak Flow      Pain Score      Pain Loc      Pain Edu?      Excl. in GC?     Constitutional: Alert and oriented. Well appearing and in no acute distress. Eyes: Conjunctivae are normal. PERRL. EOMI. Head: Atraumatic. Nose: No congestion/rhinnorhea. Mouth/Throat: Mucous membranes are moist.  Oropharynx non-erythematous. Neck: No stridor.  No cervical spine tenderness to palpation. Hematological/Lymphatic/Immunilogical: No cervical lymphadenopathy. Cardiovascular: Normal rate, regular rhythm. Grossly normal heart sounds.  Good peripheral circulation. Respiratory: Normal respiratory effort.  No retractions. Lungs CTAB. Gastrointestinal: Soft and nontender. No distention. No abdominal bruits. No CVA tenderness. Musculoskeletal: No lower extremity tenderness nor edema.  No joint effusions. Neurologic:  Normal  speech and language. No gross focal neurologic deficits are appreciated. No gait instability. Skin:  Skin is warm, dry and intact. No rash noted. Psychiatric: Mood and affect are normal. Speech and behavior are normal.  ____________________________________________   LABS (all labs ordered are listed, but only abnormal results are displayed)  Labs Reviewed  POCT RAPID STREP A   ____________________________________________  EKG   ____________________________________________  RADIOLOGY   ____________________________________________   PROCEDURES  Procedure(s) performed: None  Procedures  Critical Care performed: No  ____________________________________________   INITIAL IMPRESSION / ASSESSMENT AND PLAN / ED COURSE  Pertinent labs & imaging results that were available during my care of the patient were reviewed by me and considered in my medical decision making (see chart for details).  Viral illness and pharyngitis. Discussed negative rapid strep test with patient. Patient given discharge care instructions. Patient advised due to her gestation status supportive care is recommended.  Clinical Course   Per discharged patient states she  needs to discuss suicidal ideation and depression. Patient will be transferred over to major for evaluation of depression.  ____________________________________________   FINAL CLINICAL IMPRESSION(S) / ED DIAGNOSES  Final diagnoses:  Viral illness      NEW MEDICATIONS STARTED DURING THIS VISIT:  New Prescriptions   PROMETHAZINE (PHENERGAN) 25 MG TABLET    Take 1 tablet (25 mg total) by mouth every 6 (six) hours as needed for nausea or vomiting.     Note:  This document was prepared using Dragon voice recognition software and may include unintentional dictation errors.    Joni Reiningonald K Floye Fesler, PA-C 11/13/15 1420    Joni Reiningonald K Jessyka Austria, PA-C 11/13/15 1421    Loleta Roseory Forbach, MD 11/13/15 1430

## 2015-11-13 NOTE — ED Notes (Signed)
States she is depressed and having SI thoughts . Charge nurse aware  Pt changed out in approp clothing

## 2015-11-13 NOTE — Consult Note (Signed)
[facility] Consult   Reason for Consult:  Consult for 20 year old woman with a history of depression reporting suicidal thoughts Referring Physician:  Cinda Quest Patient Identification: Kristy Reid MRN:  [id number] Principal Diagnosis: Severe recurrent major depression without psychotic features [facility]) Diagnosis:   Patient Active Problem List   Diagnosis Date Noted  . Severe recurrent major depression without psychotic features ([place]) [F33.2] 11/13/2015  . Marijuana abuse [F12.10] 11/13/2015  . Pregnant [Z33.1] 11/13/2015  . Suicidal ideation [R45.851] 11/13/2015  . Anemia [D64.9] 01/16/2015  . Dysmenorrhea [N94.6] 01/16/2015  . Dyspareunia in female [N94.10] 01/16/2015  . Abnormal uterine bleeding [N93.9] 01/16/2015    Total Time spent with patient: 1 hour  Subjective:   Kristy Reid is a 20 y.o. female patient admitted with "I've been having suicidal thoughts".  HPI:  Patient interviewed. Chart reviewed. Labs and vitals reviewed. 20 year old woman with a history of depression who is currently about [redacted] weeks pregnant. She came to the emergency room complaining of nausea and then told the emergency room doctor she was having suicidal thoughts. She tells me that she's been feeling depressed for several weeks now. Her sleep is very poor at night. She feels tired during the day. She has not been eating well or taking care of herself well. She's having increasing suicidal thoughts with thoughts of cutting herself or overdosing. Patient feels hopeless and helpless and very negative and like she has no support. She has been prescribed Zoloft by someone at the local health department but has not been taking it for the last week or so because she felt it was not working. She claims to me that she is still using marijuana regularly although her drug screen is negative. Reports that her cousin is throwing her out of the house and she fears that she has no place to stay.  Social  history: Patient says she is originally from [place] state. Has been living here in [place] for a couple years now to be near a cousin. It sounds like it's not really working out all that well. Not currently working and unclear if she has a place to stay. She has 1 child already who is not in her care but lives in [place] with a grandparent.  Medical history: Currently pregnant. Approximately 14 weeks. No other known ongoing active medical problems.  Substance abuse history: Patient claims to be using marijuana frequently. Says that she used to drink but hasn't been drinking in months. Drug screen is all negative.  Past Psychiatric History: Patient says she's had 3 prior psychiatric hospitalizations with suicide attempts on all 3 most recently about 9 months ago. Has been treated with antidepressants in the past mostly Zoloft and trazodone. Denies any clear history of mania. She reports having some vague auditory and visual hallucinations and some mood instability that could indicate a borderline type of presentation.  Risk to Self: Is patient at risk for suicide?: No Risk to Others:   Prior Inpatient Therapy:   Prior Outpatient Therapy:    Past Medical History:  Past Medical History:  Diagnosis Date  . Anemia   . Anemia   . Anxiety   . Asthma   . Chronic back pain   . Depression   . Insomnia   . PTSD (post-traumatic stress disorder)     Past Surgical History:  Procedure Laterality Date  . BUNIONECTOMY Bilateral    feet  . wrist sugery     Family History:  Family History  Problem Relation  Age of Onset  . Diabetes Mother   . Diabetes Maternal Grandmother   . Heart disease Maternal Grandmother   . Cancer Neg Hx   . Ovarian cancer Neg Hx    Family Psychiatric  History: Multiple people in her family with depression most particularly her mother who has had suicide attempts. Social History:  History  Alcohol Use No     History  Drug Use No    Social History    Social History  . Marital status: Single    Spouse name: N/A  . Number of children: N/A  . Years of education: N/A   Social History Main Topics  . Smoking status: Former Smoker    Quit date: 08/13/2015  . Smokeless tobacco: Never Used  . Alcohol use No  . Drug use: No  . Sexual activity: Yes    Birth control/ protection: Injection   Other Topics Concern  . None   Social History Narrative  . None   Additional Social History:    Allergies:   Allergies  Allergen Reactions  . Penicillins Hives    Has patient had a PCN reaction causing immediate rash, facial/tongue/throat swelling, SOB or lightheadedness with hypotension: No Has patient had a PCN reaction causing severe rash involving mucus membranes or skin necrosis: No Has patient had a PCN reaction that required hospitalization No Has patient had a PCN reaction occurring within the last 10 years: No If all of the above answers are "NO", then may proceed with Cephalosporin use.  . Rocephin [Ceftriaxone] Hives    Labs:  Results for orders placed or performed during the hospital encounter of 11/13/15 (from the past 48 hour(s))  POCT rapid strep A [facility])     Status: None   Collection Time: 11/13/15  2:02 PM  Result Value Ref Range   Streptococcus, Group A Screen (Direct) NEGATIVE NEGATIVE  Comprehensive metabolic panel     Status: Abnormal   Collection Time: 11/13/15  3:28 PM  Result Value Ref Range   Sodium 134 (L) 135 - 145 mmol/L   Potassium 3.9 3.5 - 5.1 mmol/L   Chloride 105 101 - 111 mmol/L   CO2 20 (L) 22 - 32 mmol/L   Glucose, Bld 80 65 - 99 mg/dL   BUN 8 6 - 20 mg/dL   Creatinine, Ser 0.69 0.44 - 1.00 mg/dL   Calcium 9.2 8.9 - 10.3 mg/dL   Total Protein 7.5 6.5 - 8.1 g/dL   Albumin 3.6 3.5 - 5.0 g/dL   AST 15 15 - 41 U/L   ALT 9 (L) 14 - 54 U/L   Alkaline Phosphatase 58 38 - 126 U/L   Total Bilirubin 0.2 (L) 0.3 - 1.2 mg/dL   GFR calc non Af Amer >60 >60 mL/min   GFR calc Af Amer >60 >60  mL/min    Comment: (NOTE) The eGFR has been calculated using the CKD EPI equation. This calculation has not been validated in all clinical situations. eGFR's persistently <60 mL/min signify possible Chronic Kidney Disease.    Anion gap 9 5 - 15  Ethanol     Status: None   Collection Time: 11/13/15  3:28 PM  Result Value Ref Range   Alcohol, Ethyl (B) <5 <5 mg/dL    Comment:        LOWEST DETECTABLE LIMIT FOR SERUM ALCOHOL IS 5 mg/dL FOR MEDICAL PURPOSES ONLY   CBC WITH DIFFERENTIAL     Status: Abnormal   Collection Time: 11/13/15  3:28 PM  Result Value Ref Range   WBC 6.7 3.6 - 11.0 K/uL   RBC 4.34 3.80 - 5.20 MIL/uL   Hemoglobin 11.9 (L) 12.0 - 16.0 g/dL   HCT 35.4 35.0 - 47.0 %   MCV 81.6 80.0 - 100.0 fL   MCH 27.5 26.0 - 34.0 pg   MCHC 33.7 32.0 - 36.0 g/dL   RDW 20.3 (H) 11.5 - 14.5 %   Platelets 246 150 - 440 K/uL   Neutrophils Relative % 49 %   Neutro Abs 3.3 1.4 - 6.5 K/uL   Lymphocytes Relative 42 %   Lymphs Abs 2.8 1.0 - 3.6 K/uL   Monocytes Relative 7 %   Monocytes Absolute 0.5 0.2 - 0.9 K/uL   Eosinophils Relative 1 %   Eosinophils Absolute 0.1 0 - 0.7 K/uL   Basophils Relative 1 %   Basophils Absolute 0.0 0 - 0.1 K/uL  Salicylate level     Status: None   Collection Time: 11/13/15  3:28 PM  Result Value Ref Range   Salicylate Lvl <2.8 2.8 - 30.0 mg/dL  Acetaminophen level     Status: Abnormal   Collection Time: 11/13/15  3:28 PM  Result Value Ref Range   Acetaminophen (Tylenol), Serum <10 (L) 10 - 30 ug/mL    Comment:        THERAPEUTIC CONCENTRATIONS VARY SIGNIFICANTLY. A RANGE OF 10-30 ug/mL MAY BE AN EFFECTIVE CONCENTRATION FOR MANY PATIENTS. HOWEVER, SOME ARE BEST TREATED AT CONCENTRATIONS OUTSIDE THIS RANGE. ACETAMINOPHEN CONCENTRATIONS >150 ug/mL AT 4 HOURS AFTER INGESTION AND >50 ug/mL AT 12 HOURS AFTER INGESTION ARE OFTEN ASSOCIATED WITH TOXIC REACTIONS.   Urine Drug Screen, Qualitative ([facility] only)     Status: None   Collection Time:  11/13/15  3:29 PM  Result Value Ref Range   Tricyclic, Ur Screen NONE DETECTED NONE DETECTED   Amphetamines, Ur Screen NONE DETECTED NONE DETECTED   MDMA (Ecstasy)Ur Screen NONE DETECTED NONE DETECTED   Cocaine Metabolite,Ur [place] NONE DETECTED NONE DETECTED   Opiate, Ur Screen NONE DETECTED NONE DETECTED   Phencyclidine (PCP) Ur S NONE DETECTED NONE DETECTED   Cannabinoid 50 Ng, Ur [place] NONE DETECTED NONE DETECTED   Barbiturates, Ur Screen NONE DETECTED NONE DETECTED   Benzodiazepine, Ur Scrn NONE DETECTED NONE DETECTED   Methadone Scn, Ur NONE DETECTED NONE DETECTED    Comment: (NOTE) 366  Tricyclics, urine               Cutoff 1000 ng/mL 200  Amphetamines, urine             Cutoff 1000 ng/mL 300  MDMA (Ecstasy), urine           Cutoff 500 ng/mL 400  Cocaine Metabolite, urine       Cutoff 300 ng/mL 500  Opiate, urine                   Cutoff 300 ng/mL 600  Phencyclidine (PCP), urine      Cutoff 25 ng/mL 700  Cannabinoid, urine              Cutoff 50 ng/mL 800  Barbiturates, urine             Cutoff 200 ng/mL 900  Benzodiazepine, urine           Cutoff 200 ng/mL 1000 Methadone, urine                Cutoff 300 ng/mL 1100 1200 The urine drug  screen provides only a preliminary, unconfirmed 1300 analytical test result and should not be used for non-medical 1400 purposes. Clinical consideration and professional judgment should 1500 be applied to any positive drug screen result due to possible 1600 interfering substances. A more specific alternate chemical method 1700 must be used in order to obtain a confirmed analytical result.  1800 Gas chromato graphy / mass spectrometry (GC/MS) is the preferred 1900 confirmatory method.   Urinalysis complete, with microscopic ([facility] only)     Status: Abnormal   Collection Time: 11/13/15  3:29 PM  Result Value Ref Range   Color, Urine YELLOW (A) YELLOW   APPearance CLOUDY (A) CLEAR   Glucose, UA NEGATIVE NEGATIVE mg/dL   Bilirubin Urine NEGATIVE  NEGATIVE   Ketones, ur 2+ (A) NEGATIVE mg/dL   Specific Gravity, Urine 1.025 1.005 - 1.030   Hgb urine dipstick NEGATIVE NEGATIVE   pH 6.0 5.0 - 8.0   Protein, ur 30 (A) NEGATIVE mg/dL   Nitrite NEGATIVE NEGATIVE   Leukocytes, UA 3+ (A) NEGATIVE   RBC / HPF 0-5 0 - 5 RBC/hpf   WBC, UA TOO NUMEROUS TO COUNT 0 - 5 WBC/hpf   Bacteria, UA NONE SEEN NONE SEEN   Squamous Epithelial / LPF TOO NUMEROUS TO COUNT (A) NONE SEEN   Mucous PRESENT   hCG, quantitative, pregnancy     Status: Abnormal   Collection Time: 11/13/15  3:29 PM  Result Value Ref Range   hCG, Beta Chain, Quant, S 111,537 (H) <5 mIU/mL    Comment:          GEST. AGE      CONC.  (mIU/mL)   <=1 WEEK        5 - 50     2 WEEKS       50 - 500     3 WEEKS       100 - 10,000     4 WEEKS     1,000 - 30,000     5 WEEKS     3,500 - 115,000   6-8 WEEKS     12,000 - 270,000    12 WEEKS     15,000 - 220,000        FEMALE AND NON-PREGNANT FEMALE:     LESS THAN 5 mIU/mL     Current Facility-Administered Medications  Medication Dose Route Frequency Provider Last Rate Last Dose  . sertraline (ZOLOFT) tablet 50 mg  50 mg Oral Daily Gonzella Lex, MD       Current Outpatient Prescriptions  Medication Sig Dispense Refill  . albuterol (PROVENTIL HFA;VENTOLIN HFA) 108 (90 BASE) MCG/ACT inhaler Inhale 2 puffs into the lungs every 6 (six) hours as needed for wheezing or shortness of breath. 1 Inhaler 2  . azithromycin (ZITHROMAX Z-PAK) 250 MG tablet Take 2 tablets (500 mg) on  Day 1,  followed by 1 tablet (250 mg) once daily on Days 2 through 5. 6 each 0  . beclomethasone (QVAR) 40 MCG/ACT inhaler Inhale 1 puff into the lungs 2 (two) times daily.    . clindamycin (CLEOCIN) 150 MG capsule Take 3 capsules (450 mg total) by mouth 3 (three) times daily. 90 capsule 0  . cyclobenzaprine (FLEXERIL) 10 MG tablet Take 1 tablet (10 mg total) by mouth 3 (three) times daily as needed for muscle spasms. 30 tablet 0  . docusate sodium (COLACE) 100 MG  capsule Take 1 capsule (100 mg total) by mouth 2 (two) times daily. (Patient not taking:  Reported on 08/31/2015) 60 capsule 2  . Doxylamine-Pyridoxine 10-10 MG TBEC Take 2 tablets by mouth 2 (two) times daily. 60 tablet 1  . etonogestrel-ethinyl estradiol (NUVARING) 0.12-0.015 MG/24HR vaginal ring Insert vaginally and leave in place for 3 consecutive weeks, then remove for 1 week. (Patient not taking: Reported on 08/31/2015) 1 each 12  . ferrous sulfate 325 (65 FE) MG tablet Take 325 mg by mouth daily with breakfast.    . ibuprofen (ADVIL,MOTRIN) 800 MG tablet Take 1 tablet (800 mg total) by mouth 3 (three) times daily. (Patient not taking: Reported on 08/31/2015) 30 tablet 1  . metoCLOPramide (REGLAN) 10 MG tablet Take 1 tablet (10 mg total) by mouth every 8 (eight) hours as needed for nausea. 30 tablet 1  . metoCLOPramide (REGLAN) 10 MG tablet Take 1 tablet (10 mg total) by mouth every 8 (eight) hours as needed for nausea or vomiting. 30 tablet 1  . nitrofurantoin, macrocrystal-monohydrate, (MACROBID) 100 MG capsule Take 1 capsule (100 mg total) by mouth 2 (two) times daily. X 7 days 14 capsule 0  . predniSONE (DELTASONE) 10 MG tablet Take 5 tablets (50 mg total) by mouth daily. 20 tablet 0  . promethazine (PHENERGAN) 25 MG suppository Place 1 suppository (25 mg total) rectally every 6 (six) hours as needed for nausea or vomiting. 12 each 0  . promethazine (PHENERGAN) 25 MG tablet Take 1 tablet (25 mg total) by mouth every 6 (six) hours as needed for nausea or vomiting. 30 tablet 0    Musculoskeletal: Strength & Muscle Tone: within normal limits Gait & Station: normal Patient leans: N/A  Psychiatric Specialty Exam: Physical Exam  Nursing note and vitals reviewed. Constitutional: She appears well-developed and well-nourished.  HENT:  Head: Normocephalic and atraumatic.  Eyes: Conjunctivae are normal. Pupils are equal, round, and reactive to light.  Neck: Normal range of motion.   Cardiovascular: Normal heart sounds.   Respiratory: Effort normal.  GI: Soft.    Musculoskeletal: Normal range of motion.  Neurological: She is alert.  Skin: Skin is warm and dry.  Psychiatric: Her speech is normal and behavior is normal. Judgment normal. Her affect is blunt. Cognition and memory are normal. She expresses suicidal ideation.    Review of Systems  Constitutional: Negative.   HENT: Negative.   Eyes: Negative.   Respiratory: Negative.   Cardiovascular: Negative.   Gastrointestinal: Negative.   Musculoskeletal: Negative.   Skin: Negative.   Neurological: Negative.   Psychiatric/Behavioral: Positive for depression, hallucinations, substance abuse and suicidal ideas. Negative for memory loss. The patient is nervous/anxious and has insomnia.     Blood pressure 107/62, pulse 99, temperature 98.4 F (36.9 C), temperature source Oral, resp. rate 15, height '5\' 2"'  (1.575 m), weight 74.8 kg (165 lb), last menstrual period 07/18/2015, SpO2 100 %.Body mass index is 30.18 kg/m.  General Appearance: Fairly Groomed  Eye Contact:  Good  Speech:  Clear and Coherent  Volume:  Normal  Mood:  Depressed  Affect:  Full Range  Thought Process:  Coherent  Orientation:  Full (Time, Place, and Person)  Thought Content:  Logical  Suicidal Thoughts:  Yes.  with intent/plan  Homicidal Thoughts:  No  Memory:  Immediate;   Good Recent;   Fair Remote;   Good  Judgement:  Fair  Insight:  Fair  Psychomotor Activity:  Normal  Concentration:  Concentration: Fair  Recall:  [employer] of Knowledge:  Fair  Language:  Fair  Akathisia:  No  Handed:  Right  AIMS (if indicated):     Assets:  Communication Skills Desire for Improvement Housing Physical Health Resilience Social Support  ADL's:  Intact  Cognition:  WNL  Sleep:        Treatment Plan Summary: Daily contact with patient to assess and evaluate symptoms and progress in treatment, Medication management and Plan 20 year old  woman with a history of suicide attempts and major depression. Reporting multiple symptoms of depression with active suicidal thoughts. Does not feel safe or comfortable with her behavior outside the hospital. No really clear active support or outpatient treatment. Orders will be placed to admit her to the psychiatric hospital. Restart Zoloft and trazodone as needed for sleep. Case reviewed with emergency room and TTS.  Disposition: Recommend psychiatric Inpatient admission when medically cleared.  Alethia Berthold, MD 11/13/2015 5:37 PM

## 2015-11-13 NOTE — ED Triage Notes (Addendum)
Patient presents to ER with generalized body aches and sore throat X2 days. Patient reports trying Tylenol and cold medicine at home with no relief. Patient A&O x4 and ambulated to triage with NAD noted. Patient c/o chest soreness when breathing. Denies difficulty breathing. No respiratory distress noted. Patient reports she is currently [redacted] weeks pregnant.
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
## 2015-11-13 NOTE — ED Notes (Signed)
Walked with pt to bathroom, gait steady.

## 2015-11-13 NOTE — ED Notes (Signed)
Pt in via triage with complaints of cough, sore throat, body aches x 2 days.  Pt A/Ox4, ambulatory to room, no immediate distress noted.

## 2015-11-13 NOTE — Discharge Instructions (Signed)
Secondary to your gestation of state medication options are limited at this time. Supportive care consisting of over-the-counter Deslyn cough elixir and Tylenol 650 mg. advised salt water gargles for sore throat.

## 2015-11-14 ENCOUNTER — Inpatient Hospital Stay
Admission: AD | Admit: 2015-11-14 | Discharge: 2015-11-20 | DRG: 781 | Disposition: A | Discharge status: Home / Self Care | Payer: Medicaid Other | Source: Intra-hospital | Attending: Psychiatry | Admitting: Psychiatry

## 2015-11-14 DIAGNOSIS — F332 Major depressive disorder, recurrent severe without psychotic features: Secondary | ICD-10-CM | POA: Diagnosis present

## 2015-11-14 DIAGNOSIS — G4700 Insomnia, unspecified: Secondary | ICD-10-CM | POA: Diagnosis present

## 2015-11-14 DIAGNOSIS — F17200 Nicotine dependence, unspecified, uncomplicated: Secondary | ICD-10-CM

## 2015-11-14 DIAGNOSIS — O99512 Diseases of the respiratory system complicating pregnancy, second trimester: Secondary | ICD-10-CM | POA: Diagnosis present

## 2015-11-14 DIAGNOSIS — J45909 Unspecified asthma, uncomplicated: Secondary | ICD-10-CM | POA: Diagnosis present

## 2015-11-14 DIAGNOSIS — F603 Borderline personality disorder: Secondary | ICD-10-CM

## 2015-11-14 DIAGNOSIS — Z818 Family history of other mental and behavioral disorders: Secondary | ICD-10-CM | POA: Diagnosis not present

## 2015-11-14 DIAGNOSIS — Z62810 Personal history of physical and sexual abuse in childhood: Secondary | ICD-10-CM | POA: Diagnosis present

## 2015-11-14 DIAGNOSIS — Z833 Family history of diabetes mellitus: Secondary | ICD-10-CM

## 2015-11-14 DIAGNOSIS — Z8249 Family history of ischemic heart disease and other diseases of the circulatory system: Secondary | ICD-10-CM

## 2015-11-14 DIAGNOSIS — F17210 Nicotine dependence, cigarettes, uncomplicated: Secondary | ICD-10-CM | POA: Diagnosis present

## 2015-11-14 DIAGNOSIS — O99341 Other mental disorders complicating pregnancy, first trimester: Principal | ICD-10-CM | POA: Diagnosis present

## 2015-11-14 DIAGNOSIS — I4581 Long QT syndrome: Secondary | ICD-10-CM | POA: Diagnosis not present

## 2015-11-14 DIAGNOSIS — R45850 Homicidal ideations: Secondary | ICD-10-CM | POA: Diagnosis present

## 2015-11-14 DIAGNOSIS — O99352 Diseases of the nervous system complicating pregnancy, second trimester: Secondary | ICD-10-CM | POA: Diagnosis present

## 2015-11-14 DIAGNOSIS — Z9889 Other specified postprocedural states: Secondary | ICD-10-CM | POA: Diagnosis not present

## 2015-11-14 DIAGNOSIS — F1220 Cannabis dependence, uncomplicated: Secondary | ICD-10-CM

## 2015-11-14 DIAGNOSIS — F1290 Cannabis use, unspecified, uncomplicated: Secondary | ICD-10-CM | POA: Diagnosis present

## 2015-11-14 DIAGNOSIS — M549 Dorsalgia, unspecified: Secondary | ICD-10-CM | POA: Diagnosis present

## 2015-11-14 DIAGNOSIS — O99331 Smoking (tobacco) complicating pregnancy, first trimester: Secondary | ICD-10-CM | POA: Diagnosis present

## 2015-11-14 DIAGNOSIS — Z3A14 14 weeks gestation of pregnancy: Secondary | ICD-10-CM | POA: Diagnosis not present

## 2015-11-14 DIAGNOSIS — O98511 Other viral diseases complicating pregnancy, first trimester: Secondary | ICD-10-CM | POA: Diagnosis not present

## 2015-11-14 DIAGNOSIS — F4310 Post-traumatic stress disorder, unspecified: Secondary | ICD-10-CM

## 2015-11-14 DIAGNOSIS — Z880 Allergy status to penicillin: Secondary | ICD-10-CM

## 2015-11-14 DIAGNOSIS — Z888 Allergy status to other drugs, medicaments and biological substances status: Secondary | ICD-10-CM | POA: Diagnosis not present

## 2015-11-14 DIAGNOSIS — G8929 Other chronic pain: Secondary | ICD-10-CM | POA: Diagnosis present

## 2015-11-14 DIAGNOSIS — Z79899 Other long term (current) drug therapy: Secondary | ICD-10-CM | POA: Diagnosis not present

## 2015-11-14 DIAGNOSIS — R45851 Suicidal ideations: Secondary | ICD-10-CM | POA: Diagnosis present

## 2015-11-14 DIAGNOSIS — N760 Acute vaginitis: Secondary | ICD-10-CM | POA: Diagnosis present

## 2015-11-14 DIAGNOSIS — O99321 Drug use complicating pregnancy, first trimester: Secondary | ICD-10-CM | POA: Diagnosis present

## 2015-11-14 DIAGNOSIS — O2342 Unspecified infection of urinary tract in pregnancy, second trimester: Secondary | ICD-10-CM | POA: Diagnosis present

## 2015-11-14 DIAGNOSIS — O0990 Supervision of high risk pregnancy, unspecified, unspecified trimester: Secondary | ICD-10-CM

## 2015-11-14 LAB — URINE CULTURE: Culture: >=100000 — AB

## 2015-11-14 MED ORDER — PROMETHAZINE HCL 25 MG PO TABS: 12.5000 mg | ORAL_TABLET | Freq: Once | ORAL | Status: DC

## 2015-11-14 MED ORDER — ACETAMINOPHEN 325 MG PO TABS
650.0000 mg | ORAL_TABLET | Freq: Four times a day (QID) | ORAL | Status: DC | PRN
  Administered 2015-11-14 – 2015-11-19 (×7): 650 mg via ORAL
  Filled 2015-11-14 (×7): qty 2

## 2015-11-14 MED ORDER — NITROFURANTOIN MONOHYD MACRO 100 MG PO CAPS: 100.0000 mg | ORAL_CAPSULE | Freq: Two times a day (BID) | ORAL | Status: DC

## 2015-11-14 MED ORDER — SERTRALINE HCL 100 MG PO TABS
50.0000 mg | ORAL_TABLET | Freq: Every day | ORAL | Status: DC
  Administered 2015-11-15 – 2015-11-16 (×2): 50 mg via ORAL
  Filled 2015-11-14 (×3): qty 1

## 2015-11-14 MED ORDER — PROMETHAZINE HCL 25 MG PO TABS
ORAL_TABLET | ORAL | Status: AC
  Filled 2015-11-14: qty 1

## 2015-11-14 MED ORDER — MAGNESIUM HYDROXIDE 400 MG/5ML PO SUSP: 30.0000 mL | Freq: Every day | ORAL | Status: DC | PRN

## 2015-11-14 MED ORDER — TRAZODONE HCL 100 MG PO TABS
100.0000 mg | ORAL_TABLET | Freq: Every evening | ORAL | Status: DC | PRN
  Administered 2015-11-14: 100 mg via ORAL
  Filled 2015-11-14: qty 1

## 2015-11-14 MED ORDER — ALUM & MAG HYDROXIDE-SIMETH 200-200-20 MG/5ML PO SUSP: 30.0000 mL | ORAL | Status: DC | PRN

## 2015-11-14 MED ORDER — NITROFURANTOIN MONOHYD MACRO 100 MG PO CAPS
100.0000 mg | ORAL_CAPSULE | Freq: Two times a day (BID) | ORAL | Status: DC
  Administered 2015-11-14 – 2015-11-15 (×2): 100 mg via ORAL
  Filled 2015-11-14 (×3): qty 1

## 2015-11-14 MED ORDER — PRENATAL PLUS 27-1 MG PO TABS
1.0000 | ORAL_TABLET | Freq: Every day | ORAL | Status: DC
  Administered 2015-11-15 – 2015-11-20 (×6): 1 via ORAL
  Filled 2015-11-14 (×12): qty 1
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
## 2015-11-14 NOTE — Consult Note (Signed)
[place] [facility] Consult   Reason for Consult:  Consult for 20 year old woman with a history of depression reporting suicidal thoughts Referring Physician:  Cinda Quest Patient Identification: Kristy Reid MRN:  [id number] Principal Diagnosis: Severe recurrent major depression without psychotic features [facility]) Diagnosis:   Patient Active Problem List   Diagnosis Date Noted  . Severe recurrent major depression without psychotic features ([place]) [F33.2] 11/13/2015  . Marijuana abuse [F12.10] 11/13/2015  . Pregnant [Z33.1] 11/13/2015  . Suicidal ideation [R45.851] 11/13/2015  . Anemia [D64.9] 01/16/2015  . Dysmenorrhea [N94.6] 01/16/2015  . Dyspareunia in female [N94.10] 01/16/2015  . Abnormal uterine bleeding [N93.9] 01/16/2015    Total Time spent with patient: 20 minutes  Subjective:   Kristy Reid is a 20 y.o. female patient admitted with "I've been having suicidal thoughts".  Follow-up for 20 year old woman with depression. No new behavior problems. Continues to have passive suicidal ideation and depressed mood. No new medical complaints. Cooperative with treatment.  HPI:  Patient interviewed. Chart reviewed. Labs and vitals reviewed. 20 year old woman with a history of depression who is currently about [redacted] weeks pregnant. She came to the emergency room complaining of nausea and then told the emergency room doctor she was having suicidal thoughts. She tells me that she's been feeling depressed for several weeks now. Her sleep is very poor at night. She feels tired during the day. She has not been eating well or taking care of herself well. She's having increasing suicidal thoughts with thoughts of cutting herself or overdosing. Patient feels hopeless and helpless and very negative and like she has no support. She has been prescribed Zoloft by someone at the local health department but has not been taking it for the last week or so because she felt it was not working. She claims to me  that she is still using marijuana regularly although her drug screen is negative. Reports that her cousin is throwing her out of the house and she fears that she has no place to stay.  Social history: Patient says she is originally from [place] state. Has been living here in [place] for a couple years now to be near a cousin. It sounds like it's not really working out all that well. Not currently working and unclear if she has a place to stay. She has 1 child already who is not in her care but lives in [place] with a grandparent.  Medical history: Currently pregnant. Approximately 14 weeks. No other known ongoing active medical problems.  Substance abuse history: Patient claims to be using marijuana frequently. Says that she used to drink but hasn't been drinking in months. Drug screen is all negative.  Past Psychiatric History: Patient says she's had 3 prior psychiatric hospitalizations with suicide attempts on all 3 most recently about 9 months ago. Has been treated with antidepressants in the past mostly Zoloft and trazodone. Denies any clear history of mania. She reports having some vague auditory and visual hallucinations and some mood instability that could indicate a borderline type of presentation.  Risk to Self: Suicidal Ideation: Yes-Currently Present Suicidal Intent: Yes-Currently Present Is patient at risk for suicide?: Yes Suicidal Plan?: Yes-Currently Present Specify Current Suicidal Plan: cut self, or overdose Access to Means: Yes Specify Access to Suicidal Means: access to sharp objects and pills What has been your use of drugs/alcohol within the last 12 months?: marijuana How many times?: 3 Other Self Harm Risks: none noted Triggers for Past Attempts: Unpredictable Intentional Self Injurious Behavior: Bruising, Damaging Comment -  Self Injurious Behavior: per pt. reporst notes hit self in head Risk to Others: Homicidal Ideation: No Thoughts of Harm to Others:  No Current Homicidal Intent: No Current Homicidal Plan: No Access to Homicidal Means: No Identified Victim: none History of harm to others?: No Assessment of Violence: None Noted Violent Behavior Description: n/a Does patient have access to weapons?: Yes (Comment) Criminal Charges Pending?: No Does patient have a court date: No Prior Inpatient Therapy: Prior Inpatient Therapy: Yes Prior Therapy Dates: 2015 Prior Therapy Facilty/Provider(s): [facility]  Reason for Treatment: borderline personallity disorder Prior Outpatient Therapy: Prior Outpatient Therapy: Yes Prior Therapy Dates: current' Prior Therapy Facilty/Provider(s): [facility] Reason for Treatment:  (Borderline Personality Disorder) Does patient have an ACCT team?: No Does patient have Intensive In-House Services?  : No Does patient have [facility] services? : No Does patient have P4CC services?: No  Past Medical History:  Past Medical History:  Diagnosis Date  . Anemia   . Anemia   . Anxiety   . Asthma   . Chronic back pain   . Depression   . Insomnia   . PTSD (post-traumatic stress disorder)     Past Surgical History:  Procedure Laterality Date  . BUNIONECTOMY Bilateral    feet  . wrist sugery     Family History:  Family History  Problem Relation Age of Onset  . Diabetes Mother   . Diabetes Maternal Grandmother   . Heart disease Maternal Grandmother   . Cancer Neg Hx   . Ovarian cancer Neg Hx    Family Psychiatric  History: Multiple people in her family with depression most particularly her mother who has had suicide attempts. Social History:  History  Alcohol Use No     History  Drug Use No    Social History   Social History  . Marital status: Single    Spouse name: N/A  . Number of children: N/A  . Years of education: N/A   Social History Main Topics  . Smoking status: Former Smoker    Quit date: 08/13/2015  . Smokeless tobacco: Never Used  . Alcohol use No  .  Drug use: No  . Sexual activity: Yes    Birth control/ protection: Injection   Other Topics Concern  . None   Social History Narrative  . None   Additional Social History:    Allergies:   Allergies  Allergen Reactions  . Penicillins Hives    Has patient had a PCN reaction causing immediate rash, facial/tongue/throat swelling, SOB or lightheadedness with hypotension: No Has patient had a PCN reaction causing severe rash involving mucus membranes or skin necrosis: No Has patient had a PCN reaction that required hospitalization No Has patient had a PCN reaction occurring within the last 10 years: No If all of the above answers are "NO", then may proceed with Cephalosporin use.  . Rocephin [Ceftriaxone] Hives    Labs:  Results for orders placed or performed during the hospital encounter of 11/13/15 (from the past 48 hour(s))  POCT rapid strep A [facility])     Status: None   Collection Time: 11/13/15  2:02 PM  Result Value Ref Range   Streptococcus, Group A Screen (Direct) NEGATIVE NEGATIVE  Comprehensive metabolic panel     Status: Abnormal   Collection Time: 11/13/15  3:28 PM  Result Value Ref Range   Sodium 134 (L) 135 - 145 mmol/L   Potassium 3.9 3.5 - 5.1 mmol/L   Chloride 105 101 -  111 mmol/L   CO2 20 (L) 22 - 32 mmol/L   Glucose, Bld 80 65 - 99 mg/dL   BUN 8 6 - 20 mg/dL   Creatinine, Ser 0.69 0.44 - 1.00 mg/dL   Calcium 9.2 8.9 - 10.3 mg/dL   Total Protein 7.5 6.5 - 8.1 g/dL   Albumin 3.6 3.5 - 5.0 g/dL   AST 15 15 - 41 U/L   ALT 9 (L) 14 - 54 U/L   Alkaline Phosphatase 58 38 - 126 U/L   Total Bilirubin 0.2 (L) 0.3 - 1.2 mg/dL   GFR calc non Af Amer >60 >60 mL/min   GFR calc Af Amer >60 >60 mL/min    Comment: (NOTE) The eGFR has been calculated using the CKD EPI equation. This calculation has not been validated in all clinical situations. eGFR's persistently <60 mL/min signify possible Chronic Kidney Disease.    Anion gap 9 5 - 15  Ethanol     Status:  None   Collection Time: 11/13/15  3:28 PM  Result Value Ref Range   Alcohol, Ethyl (B) <5 <5 mg/dL    Comment:        LOWEST DETECTABLE LIMIT FOR SERUM ALCOHOL IS 5 mg/dL FOR MEDICAL PURPOSES ONLY   CBC WITH DIFFERENTIAL     Status: Abnormal   Collection Time: 11/13/15  3:28 PM  Result Value Ref Range   WBC 6.7 3.6 - 11.0 K/uL   RBC 4.34 3.80 - 5.20 MIL/uL   Hemoglobin 11.9 (L) 12.0 - 16.0 g/dL   HCT 35.4 35.0 - 47.0 %   MCV 81.6 80.0 - 100.0 fL   MCH 27.5 26.0 - 34.0 pg   MCHC 33.7 32.0 - 36.0 g/dL   RDW 20.3 (H) 11.5 - 14.5 %   Platelets 246 150 - 440 K/uL   Neutrophils Relative % 49 %   Neutro Abs 3.3 1.4 - 6.5 K/uL   Lymphocytes Relative 42 %   Lymphs Abs 2.8 1.0 - 3.6 K/uL   Monocytes Relative 7 %   Monocytes Absolute 0.5 0.2 - 0.9 K/uL   Eosinophils Relative 1 %   Eosinophils Absolute 0.1 0 - 0.7 K/uL   Basophils Relative 1 %   Basophils Absolute 0.0 0 - 0.1 K/uL  Salicylate level     Status: None   Collection Time: 11/13/15  3:28 PM  Result Value Ref Range   Salicylate Lvl <2.6 2.8 - 30.0 mg/dL  Acetaminophen level     Status: Abnormal   Collection Time: 11/13/15  3:28 PM  Result Value Ref Range   Acetaminophen (Tylenol), Serum <10 (L) 10 - 30 ug/mL    Comment:        THERAPEUTIC CONCENTRATIONS VARY SIGNIFICANTLY. A RANGE OF 10-30 ug/mL MAY BE AN EFFECTIVE CONCENTRATION FOR MANY PATIENTS. HOWEVER, SOME ARE BEST TREATED AT CONCENTRATIONS OUTSIDE THIS RANGE. ACETAMINOPHEN CONCENTRATIONS >150 ug/mL AT 4 HOURS AFTER INGESTION AND >50 ug/mL AT 12 HOURS AFTER INGESTION ARE OFTEN ASSOCIATED WITH TOXIC REACTIONS.   Urine Drug Screen, Qualitative ([facility] only)     Status: None   Collection Time: 11/13/15  3:29 PM  Result Value Ref Range   Tricyclic, Ur Screen NONE DETECTED NONE DETECTED   Amphetamines, Ur Screen NONE DETECTED NONE DETECTED   MDMA (Ecstasy)Ur Screen NONE DETECTED NONE DETECTED   Cocaine Metabolite,Ur [place] NONE DETECTED NONE DETECTED   Opiate, Ur  Screen NONE DETECTED NONE DETECTED   Phencyclidine (PCP) Ur S NONE DETECTED NONE DETECTED   Cannabinoid 50  Ng, Ur [place] NONE DETECTED NONE DETECTED   Barbiturates, Ur Screen NONE DETECTED NONE DETECTED   Benzodiazepine, Ur Scrn NONE DETECTED NONE DETECTED   Methadone Scn, Ur NONE DETECTED NONE DETECTED    Comment: (NOTE) 119  Tricyclics, urine               Cutoff 1000 ng/mL 200  Amphetamines, urine             Cutoff 1000 ng/mL 300  MDMA (Ecstasy), urine           Cutoff 500 ng/mL 400  Cocaine Metabolite, urine       Cutoff 300 ng/mL 500  Opiate, urine                   Cutoff 300 ng/mL 600  Phencyclidine (PCP), urine      Cutoff 25 ng/mL 700  Cannabinoid, urine              Cutoff 50 ng/mL 800  Barbiturates, urine             Cutoff 200 ng/mL 900  Benzodiazepine, urine           Cutoff 200 ng/mL 1000 Methadone, urine                Cutoff 300 ng/mL 1100 1200 The urine drug screen provides only a preliminary, unconfirmed 1300 analytical test result and should not be used for non-medical 1400 purposes. Clinical consideration and professional judgment should 1500 be applied to any positive drug screen result due to possible 1600 interfering substances. A more specific alternate chemical method 1700 must be used in order to obtain a confirmed analytical result.  1800 Gas chromato graphy / mass spectrometry (GC/MS) is the preferred 1900 confirmatory method.   Urinalysis complete, with microscopic ([facility] only)     Status: Abnormal   Collection Time: 11/13/15  3:29 PM  Result Value Ref Range   Color, Urine YELLOW (A) YELLOW   APPearance CLOUDY (A) CLEAR   Glucose, UA NEGATIVE NEGATIVE mg/dL   Bilirubin Urine NEGATIVE NEGATIVE   Ketones, ur 2+ (A) NEGATIVE mg/dL   Specific Gravity, Urine 1.025 1.005 - 1.030   Hgb urine dipstick NEGATIVE NEGATIVE   pH 6.0 5.0 - 8.0   Protein, ur 30 (A) NEGATIVE mg/dL   Nitrite NEGATIVE NEGATIVE   Leukocytes, UA 3+ (A) NEGATIVE   RBC / HPF 0-5 0 - 5  RBC/hpf   WBC, UA TOO NUMEROUS TO COUNT 0 - 5 WBC/hpf   Bacteria, UA NONE SEEN NONE SEEN   Squamous Epithelial / LPF TOO NUMEROUS TO COUNT (A) NONE SEEN   Mucous PRESENT   hCG, quantitative, pregnancy     Status: Abnormal   Collection Time: 11/13/15  3:29 PM  Result Value Ref Range   hCG, Beta Chain, Quant, S 111,537 (H) <5 mIU/mL    Comment:          GEST. AGE      CONC.  (mIU/mL)   <=1 WEEK        5 - 50     2 WEEKS       50 - 500     3 WEEKS       100 - 10,000     4 WEEKS     1,000 - 30,000     5 WEEKS     3,500 - 115,000   6-8 WEEKS     12,000 - 270,000    12 WEEKS  15,000 - 220,000        FEMALE AND NON-PREGNANT FEMALE:     LESS THAN 5 mIU/mL     Current Facility-Administered Medications  Medication Dose Route Frequency Provider Last Rate Last Dose  . nitrofurantoin (macrocrystal-monohydrate) (MACROBID) capsule 100 mg  100 mg Oral Q12H Schuyler Amor, MD      . prenatal multivitamin tablet 1 tablet  1 tablet Oral Daily Gonzella Lex, MD   1 tablet at 11/14/15 1028  . promethazine (PHENERGAN) 25 MG tablet           . promethazine (PHENERGAN) tablet 12.5 mg  12.5 mg Oral Once Schuyler Amor, MD      . sertraline (ZOLOFT) tablet 50 mg  50 mg Oral Daily Gonzella Lex, MD   50 mg at 11/14/15 1027   Current Outpatient Prescriptions  Medication Sig Dispense Refill  . albuterol (PROVENTIL HFA;VENTOLIN HFA) 108 (90 BASE) MCG/ACT inhaler Inhale 2 puffs into the lungs every 6 (six) hours as needed for wheezing or shortness of breath. 1 Inhaler 2  . azithromycin (ZITHROMAX Z-PAK) 250 MG tablet Take 2 tablets (500 mg) on  Day 1,  followed by 1 tablet (250 mg) once daily on Days 2 through 5. 6 each 0  . beclomethasone (QVAR) 40 MCG/ACT inhaler Inhale 1 puff into the lungs 2 (two) times daily.    . clindamycin (CLEOCIN) 150 MG capsule Take 3 capsules (450 mg total) by mouth 3 (three) times daily. 90 capsule 0  . cyclobenzaprine (FLEXERIL) 10 MG tablet Take 1 tablet (10 mg total) by  mouth 3 (three) times daily as needed for muscle spasms. 30 tablet 0  . docusate sodium (COLACE) 100 MG capsule Take 1 capsule (100 mg total) by mouth 2 (two) times daily. (Patient not taking: Reported on 08/31/2015) 60 capsule 2  . Doxylamine-Pyridoxine 10-10 MG TBEC Take 2 tablets by mouth 2 (two) times daily. 60 tablet 1  . etonogestrel-ethinyl estradiol (NUVARING) 0.12-0.015 MG/24HR vaginal ring Insert vaginally and leave in place for 3 consecutive weeks, then remove for 1 week. (Patient not taking: Reported on 08/31/2015) 1 each 12  . ferrous sulfate 325 (65 FE) MG tablet Take 325 mg by mouth daily with breakfast.    . ibuprofen (ADVIL,MOTRIN) 800 MG tablet Take 1 tablet (800 mg total) by mouth 3 (three) times daily. (Patient not taking: Reported on 08/31/2015) 30 tablet 1  . metoCLOPramide (REGLAN) 10 MG tablet Take 1 tablet (10 mg total) by mouth every 8 (eight) hours as needed for nausea. 30 tablet 1  . metoCLOPramide (REGLAN) 10 MG tablet Take 1 tablet (10 mg total) by mouth every 8 (eight) hours as needed for nausea or vomiting. 30 tablet 1  . nitrofurantoin, macrocrystal-monohydrate, (MACROBID) 100 MG capsule Take 1 capsule (100 mg total) by mouth 2 (two) times daily. X 7 days 14 capsule 0  . predniSONE (DELTASONE) 10 MG tablet Take 5 tablets (50 mg total) by mouth daily. 20 tablet 0  . promethazine (PHENERGAN) 25 MG suppository Place 1 suppository (25 mg total) rectally every 6 (six) hours as needed for nausea or vomiting. 12 each 0  . promethazine (PHENERGAN) 25 MG tablet Take 1 tablet (25 mg total) by mouth every 6 (six) hours as needed for nausea or vomiting. 30 tablet 0    Musculoskeletal: Strength & Muscle Tone: within normal limits Gait & Station: normal Patient leans: N/A  Psychiatric Specialty Exam: Physical Exam  Nursing note and vitals reviewed. Constitutional: She  appears well-developed and well-nourished.  HENT:  Head: Normocephalic and atraumatic.  Eyes: Conjunctivae  are normal. Pupils are equal, round, and reactive to light.  Neck: Normal range of motion.  Cardiovascular: Normal heart sounds.   Respiratory: Effort normal.  GI: Soft.    Musculoskeletal: Normal range of motion.  Neurological: She is alert.  Skin: Skin is warm and dry.  Psychiatric: Her speech is normal and behavior is normal. Judgment normal. Her affect is blunt. Cognition and memory are normal. She expresses suicidal ideation.    Review of Systems  Constitutional: Negative.   HENT: Negative.   Eyes: Negative.   Respiratory: Negative.   Cardiovascular: Negative.   Gastrointestinal: Negative.   Musculoskeletal: Negative.   Skin: Negative.   Neurological: Negative.   Psychiatric/Behavioral: Positive for depression, hallucinations, substance abuse and suicidal ideas. Negative for memory loss. The patient is nervous/anxious and has insomnia.     Blood pressure (!) 110/57, pulse 92, temperature 98.6 F (37 C), temperature source Oral, resp. rate 16, height _0  (1.575 m), weight 74.8 kg (165 lb), last menstrual period 07/18/2015, SpO2 100 %.Body mass index is 30.18 kg/m.  General Appearance: Fairly Groomed  Eye Contact:  Good  Speech:  Clear and Coherent  Volume:  Normal  Mood:  Depressed  Affect:  Full Range  Thought Process:  Coherent  Orientation:  Full (Time, Place, and Person)  Thought Content:  Logical  Suicidal Thoughts:  Yes.  with intent/plan  Homicidal Thoughts:  No  Memory:  Immediate;   Good Recent;   Fair Remote;   Good  Judgement:  Fair  Insight:  Fair  Psychomotor Activity:  Normal  Concentration:  Concentration: Fair  Recall:  [employer] of Knowledge:  Fair  Language:  Fair  Akathisia:  No  Handed:  Right  AIMS (if indicated):     Assets:  Communication Skills Desire for Improvement Housing Physical Health Resilience Social Support  ADL's:  Intact  Cognition:  WNL  Sleep:        Treatment Plan Summary: Daily contact with patient to assess  and evaluate symptoms and progress in treatment, Medication management and Plan Case reviewed with nursing staff. Order placed for obstetric consult. Continue current treatment. Patient committed admitted to the psychiatric ward downstairs once a bed is available.  Disposition: Recommend psychiatric Inpatient admission when medically cleared.  Alethia Berthold, MD 11/14/2015 3:10 PM

## 2015-11-14 NOTE — Progress Notes (Signed)
D: 20 year old black female  In under the services of  Dr. Ardyth HarpsHernandez . Patient admitted for  Suicidal thoughts . Patient is [redacted] weeks pregnant . States she is [facility]. Stated she has feeling depressed for several weeks  Patient living with cousin . Not working out well , but patient states it is .  Stated she can return to her cousins home . Patient stated she is still smoking  Weed.  Pt appeared depressed  With  a flat affect. A: Pt admitted to unit per protocol, skin assessment  Within normal limits  No breaks  Or bruises and search done and no contraband found.  Pt  educated on therapeutic milieu rules. Pt was introduced to milieu by nursing staff.    R: Pt was receptive to education about the milieu .  15 min safety checks started. Clinical research associatewriter offered support

## 2015-11-14 NOTE — Progress Notes (Signed)
[facility] Dr. Toni Amendlapacs.  Attending Physician will be Dr. Ardyth HarpsHernandez.   [facility] BuckhornPhyllis.   Intake Paper Work has been signed and placed on patient chart.  ER staff is aware of the admission Irving Burton( [facility]  ER MD; Toniann FailWendy Patient's Nurse & Marny LowensteinUrsula Patient Access).  Cheryl FlashNicole Talecia Sherlin, MS, NCC, LPCA Therapeutic Triage Specialist

## 2015-11-14 NOTE — ED Notes (Signed)
Lunch served

## 2015-11-14 NOTE — Tx Team (Signed)
Initial Treatment Plan 11/14/2015 6:15 PM Kristy Reid [id number]    PATIENT STRESSORS: Health problems Substance abuse   PATIENT STRENGTHS: Ability for insight Active sense of humor Average or above average intelligence Supportive family/friends   PATIENT IDENTIFIED PROBLEMS: Suicidal 11/14/15  Depression 11/14/15  Subtance Abuse 11/14/15                 DISCHARGE CRITERIA:  Ability to meet basic life and health needs Improved stabilization in mood, thinking, and/or behavior Motivation to continue treatment in a less acute level of care  PRELIMINARY DISCHARGE PLAN: Outpatient therapy Return to previous living arrangement  PATIENT/FAMILY INVOLVEMENT: This treatment plan has been presented to and reviewed with the patient, Kristy Reid, and/or family member,   The patient and family have been given the opportunity to ask questions and make suggestions.  Crist InfanteGwen A Avry Monteleone, RN 11/14/2015, 6:15 PM

## 2015-11-14 NOTE — ED Notes (Signed)
Patient has nausea, nurse got phenergan ordered po, patient refused it at this time and states she had rather have zofran, doctor refused ordering  Zofran,Patient states she will take phenergan a little later, patient is safe, q 15 min. Checks, camera monitoring in progress.

## 2015-11-14 NOTE — ED Notes (Signed)
[facility]

## 2015-11-14 NOTE — ED Notes (Signed)
Breakfast tray served, patient is pregnant and cannot eat eggs, nurse did call dining room and ordered something that she could tolerate.
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
## 2015-11-14 NOTE — ED Provider Notes (Addendum)
-----------------------------------------   7:41 AM on 11/14/2015 -----------------------------------------   Blood pressure 119/68, pulse (!) 101, temperature 98.2 F (36.8 C), temperature source Oral, resp. rate 18, height 5\' 2"  (1.575 m), weight 165 lb (74.8 kg), last menstrual period 07/18/2015, SpO2 99 %.  The patient had no acute events since last update.  Calm and cooperative at this time.  Disposition is pending Psychiatry/Behavioral Medicine team recommendations. Medically cleared prior to my arrival  ----------------------------------------- 1:45 PM on 11/14/2015 -----------------------------------------  Patient remains well-appearing does have some nausea which she has had for weeks. Do not that we are treating her for urinary tract infection, we'll continue that treatment as she is here longer than initially anticipated. Urine culture is pending. She is afebrile and has no complaints consistent with pyelonephritis this time. Jeanmarie PlantJames A McShane, MD 11/14/15 [id number]    Jeanmarie PlantJames A McShane, MD 11/14/15 1346

## 2015-11-14 NOTE — ED Notes (Signed)
Pt is alert and oriented on admission. Pt is somewhat apprehensive but is cooperative and follows staff instructions. Pt denies SI/HI and AVH at this time ands wants to return to sleep. Writer oriented pt to the [facility] and 15 minute checks are ongoing for safety.

## 2015-11-14 NOTE — Plan of Care (Signed)
Problem: Safety: Goal: Ability to remain free from injury will improve Outcome: Progressing Patient able to state if she needs  Staff.

## 2015-11-14 NOTE — ED Notes (Signed)
Patient is sleeping, no signs of distress, Patient with tv on in room, q 15 min. Checks, camera monitoring in progress.

## 2015-11-14 NOTE — ED Notes (Signed)
Patient is dayroom, playing cards with another patient, patient is calm and cooperatvie, no signs of distress, patient is safe q 15 min. Checks and camera monitoring in progress.

## 2015-11-14 NOTE — ED Notes (Signed)
Patient is sleeping , no signs of distress. q 15 min. Checks and camera monitoring in progress.

## 2015-11-15 DIAGNOSIS — O0990 Supervision of high risk pregnancy, unspecified, unspecified trimester: Secondary | ICD-10-CM

## 2015-11-15 DIAGNOSIS — F603 Borderline personality disorder: Secondary | ICD-10-CM

## 2015-11-15 DIAGNOSIS — F1220 Cannabis dependence, uncomplicated: Secondary | ICD-10-CM

## 2015-11-15 DIAGNOSIS — F332 Major depressive disorder, recurrent severe without psychotic features: Secondary | ICD-10-CM

## 2015-11-15 DIAGNOSIS — J45909 Unspecified asthma, uncomplicated: Secondary | ICD-10-CM

## 2015-11-15 DIAGNOSIS — F17200 Nicotine dependence, unspecified, uncomplicated: Secondary | ICD-10-CM

## 2015-11-15 DIAGNOSIS — F4310 Post-traumatic stress disorder, unspecified: Secondary | ICD-10-CM

## 2015-11-15 LAB — LIPID PANEL
CHOL/HDL RATIO: 3.6 ratio
Cholesterol: 186 mg/dL (ref 0–200)
HDL: 51 mg/dL (ref >40)
LDL CALC: 113 mg/dL — AB (ref 0–99)
Triglycerides: 108 mg/dL (ref <150)
VLDL: 22 mg/dL (ref 0–40)

## 2015-11-15 LAB — TSH: TSH: 1.786 u[IU]/mL (ref 0.350–4.500)

## 2015-11-15 MED ORDER — CLINDAMYCIN HCL 150 MG PO CAPS
450.0000 mg | ORAL_CAPSULE | Freq: Three times a day (TID) | ORAL | Status: DC
  Administered 2015-11-15 – 2015-11-20 (×14): 450 mg via ORAL
  Filled 2015-11-15 (×14): qty 3

## 2015-11-15 MED ORDER — ONDANSETRON HCL 4 MG PO TABS
4.0000 mg | ORAL_TABLET | Freq: Three times a day (TID) | ORAL | Status: DC | PRN
  Administered 2015-11-16 – 2015-11-17 (×2): 4 mg via ORAL
  Filled 2015-11-15 (×2): qty 1

## 2015-11-15 MED ORDER — DIPHENHYDRAMINE HCL 25 MG PO CAPS
50.0000 mg | ORAL_CAPSULE | Freq: Every day | ORAL | Status: DC
  Administered 2015-11-15: 50 mg via ORAL
  Filled 2015-11-15: qty 2

## 2015-11-15 MED ORDER — NICOTINE 14 MG/24HR TD PT24
14.0000 mg | MEDICATED_PATCH | Freq: Every day | TRANSDERMAL | Status: DC
  Administered 2015-11-15 – 2015-11-18 (×4): 14 mg via TRANSDERMAL
  Filled 2015-11-15 (×4): qty 1

## 2015-11-15 MED ORDER — ALBUTEROL SULFATE HFA 108 (90 BASE) MCG/ACT IN AERS: 1.0000 | INHALATION_SPRAY | RESPIRATORY_TRACT | Status: DC | PRN

## 2015-11-15 MED ORDER — PROMETHAZINE HCL 25 MG PO TABS
25.0000 mg | ORAL_TABLET | Freq: Every day | ORAL | Status: DC | PRN
  Administered 2015-11-15 – 2015-11-19 (×5): 25 mg via ORAL
  Filled 2015-11-15 (×5): qty 1

## 2015-11-15 NOTE — [facility] Suicide Risk Assessment (Signed)
Kristy Reid   Nursing information obtained from:    Demographic factors:    Current Mental Status:    Loss Factors:    Historical Factors:    Risk Reduction Factors:     Total Time spent with patient: 1 hour Principal Problem: Severe recurrent major depression without psychotic features Kristy Reid(HCC) Diagnosis:   Patient Active Problem List   Diagnosis Date Noted  . Pregnancy (I trimester) [Z33.1] 11/15/2015  . Cannabis use disorder, severe, dependence (HCC) [F12.20] 11/15/2015  . Tobacco use disorder [F17.200] 11/15/2015  . Asthma [J45.909] 11/15/2015  . Severe recurrent major depression without psychotic features (HCC) [F33.2] 11/13/2015   Subjective Data:   Continued Clinical Symptoms:  Alcohol Use Disorder Identification Test Final Score (AUDIT): 0 The "Alcohol Use Disorders Identification Test", Guidelines for Use in Primary Care, Second Edition.  World Science writerHealth Organization [facility](WHO). Score between 0-7:  no or low risk or alcohol related problems. Score between 8-15:  moderate risk of alcohol related problems. Score between 16-19:  high risk of alcohol related problems. Score 20 or above:  warrants further diagnostic evaluation for alcohol dependence and treatment.   CLINICAL FACTORS:   Depression:   Impulsivity Insomnia Alcohol/Substance Abuse/Dependencies Personality Disorders:   Cluster B Comorbid depression Previous Psychiatric Diagnoses and Treatments     Psychiatric Specialty Exam: Physical Exam  ROS  Blood pressure 113/70, pulse 87, temperature 98.3 F (36.8 C), temperature source Oral, resp. rate 18, height 5\' 2"  (1.575 m), weight 67.1 kg (148 lb), last menstrual period 07/18/2015, SpO2 100 %.Body mass index is 27.07 kg/m.                                                    Sleep:  Number of Hours: 6.45      COGNITIVE FEATURES THAT CONTRIBUTE TO RISK:  None    SUICIDE RISK:   Moderate:  Frequent suicidal  ideation with limited intensity, and duration, some specificity in terms of plans, no associated intent, good self-control, limited dysphoria/symptomatology, some risk factors present, and identifiable protective factors, including available and accessible social support.   PLAN OF CARE: [facility]  I certify that inpatient services furnished can reasonably be expected to improve the patient's condition.  Kristy Reid,  Kristy Trueheart, MD 11/15/2015, 1:14 PM

## 2015-11-15 NOTE — [facility] Suicide Risk Assessment (Signed)
[facility] INPATIENT:  Family/Significant Other Suicide Prevention Education  Suicide Prevention Education:  Education Completed; friend, Gerlean RenDana Love, ph #[phone] has been identified by the patient as the family member/significant other with whom the patient will be residing, and identified as the person(s) who will aid the patient in the event of a mental health crisis (suicidal ideations/suicide attempt).  With written consent from the patient, the family member/significant other has been provided the following suicide prevention education, prior to the and/or following the discharge of the patient. Pt's friend stated that she has not known patient that long and does not know much about her depressive episodes. Stated that she can return home with her as long as she takes her medications and sees a therapist. She expresses concerns for the unborn child.  The suicide prevention education provided includes the following:  Suicide risk factors  Suicide prevention and interventions  National Suicide Hotline telephone number  [facility] assessment telephone number  [facility] Emergency Assistance 911  [facility] and/or Residential Mobile Crisis Unit telephone number  Request made of family/significant other to:  Remove weapons (e.g., guns, rifles, knives), all items previously/currently identified as safety concern.    Remove drugs/medications (over-the-counter, prescriptions, illicit drugs), all items previously/currently identified as a safety concern.  The family member/significant other verbalizes understanding of the suicide prevention education information provided.  The family member/significant other agrees to remove the items of safety concern listed above.  Lynden OxfordKadijah R Savvy Peeters, MSW, LCSW-A 11/15/2015, 11:57 AM
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
## 2015-11-15 NOTE — Progress Notes (Signed)
ED Antimicrobial Stewardship Positive Culture Follow Up   Kristy Reid is an 20 y.o. female who presented to [facility] on [facility]2015 with a chief complaint of  Chief Complaint  Patient presents with  . Abdominal Pain    Recent Results (from the past 720 hour(s))  Urine culture     Status: Abnormal   Collection Time: 11/07/15  2:38 PM  Result Value Ref Range Status   Specimen Description URINE, RANDOM  Final   Special Requests NONE  Final   Culture (A)  Final    >=100,000 COLONIES/mL GROUP B STREP(S.AGALACTIAE)ISOLATED Virtually 100% of S. agalactiae (Group B) strains are susceptible to Penicillin.  For Penicillin-allergic patients, Erythromycin (85-95% sensitive) and Clindamycin (80% sensitive) are drugs of choice. Contact microbiology lab to request sensitivities if  needed within 7 days. Performed at [facility]    Report Status 11/14/2015 FINAL  Final  Wet prep, genital     Status: Abnormal   Collection Time: 11/07/15  5:12 PM  Result Value Ref Range Status   Yeast Wet Prep HPF POC NONE SEEN NONE SEEN Final   Trich, Wet Prep PRESENT (A) NONE SEEN Final   Clue Cells Wet Prep HPF POC NONE SEEN NONE SEEN Final   WBC, Wet Prep HPF POC MANY (A) NONE SEEN Final   Sperm NONE SEEN  Final  Chlamydia/NGC rt PCR ([facility] only)     Status: None   Collection Time: 11/07/15  5:12 PM  Result Value Ref Range Status   Specimen source GC/Chlam ENDOCERVICAL  Final   Chlamydia Tr NOT DETECTED NOT DETECTED Final   N gonorrhoeae NOT DETECTED NOT DETECTED Final    Comment: (NOTE) 100  This methodology has not been evaluated in pregnant women or in 200  patients with a history of hysterectomy. 300 400  This methodology will not be performed on patients less than 8114  years of age.     Urine cx on 08/24 revealed GBS and pt is [redacted]wks pregnant. Patient was discharged on nitrofurantoin 100 mg caps bid x 7 days. [facility] and was restarted on nitrofurantoin. Spoke to  MD and due to lack of GBS coverage and allergy to penicillin and ceftriaxone, will start pt on clindamycin 450 mg q 8 hr as this was the dose she was on in June. Nitrofurantoin has been discontinued.   Horris LatinoHolly Gilliam, PharmD Pharmacy Resident 11/15/2015 6:09 PM

## 2015-11-15 NOTE — Progress Notes (Signed)
D: Patient alert and orient x4. Complaints of headache today. Medication and group compliant. Denies SI, HI, AVH. Med and group compliant. A: Encouragement and support offered. Safety maintained with q 15 min checks. OB consult today.  R: Pt receptive and remains safe on unit with q 15 min checks.

## 2015-11-15 NOTE — Progress Notes (Signed)
D: Patient is alert and oriented on the unit this shift. Patient attended and   participated in groups today. Patient denies suicidal ideation, homicidal ideation, auditory or visual hallucinations at the present time.  A: Scheduled medications are administered to patient as per MD orders. Emotional support and encouragement are provided. Patient is maintained on q.15 minute safety checks. Patient is informed to notify staff with questions or concerns. R: No adverse medication reactions are noted. Patient is cooperative with medication administration and treatment plan today. Patient is  calm and cooperative on the unit at this time. Patient interacts  with others on the unit this shift. Patient contracts for safety at this time. Patient remains safe at this time.
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
## 2015-11-15 NOTE — Plan of Care (Signed)
Problem: Education: Goal: Knowledge of the prescribed therapeutic regimen will improve Outcome: Progressing Patient able to state medications and what they are for. Med compliant

## 2015-11-15 NOTE — Plan of Care (Signed)
Problem: Coping: Goal: Ability to cope will improve Outcome: Not Progressing Patient has not learned coping skills as of this shift Tax adviserCTownsend RN

## 2015-11-15 NOTE — [facility] Group Notes (Signed)
[facility] Group Notes:  (Nursing/MHT/Case Management/Adjunct)  Date:  11/15/2015  Time:  1:12 AM  Type of Therapy:  Group Therapy  Participation Level:  Active  Participation Quality:  Appropriate  Affect:  Appropriate  Cognitive:  Appropriate  Insight:  Appropriate  Engagement in Group:  Engaged  Modes of Intervention:  n/a  Summary of Progress/Problems:  Kristy Reid 11/15/2015, 1:12 AM

## 2015-11-15 NOTE — [facility] Group Notes (Signed)
[facility] Group Notes:  (Nursing/MHT/Case Management/Adjunct)  Date:  11/15/2015  Time:  6:03 PM  Type of Therapy:  Psychoeducational Skills  Participation Level:  Active  Participation Quality:  Appropriate, Attentive and Sharing  Affect:  Appropriate  Cognitive:  Appropriate  Insight:  Appropriate  Engagement in Group:  Engaged  Modes of Intervention:  Socialization  Summary of Progress/Problems:  Lynelle SmokeCara Travis Snow Reid 11/15/2015, 6:03 PM

## 2015-11-15 NOTE — Tx Team (Signed)
Kristy Reid  Pt name [id number]    Severe recurrent major depression without psychotic features [facility](HCC)  Principal Problem Principal Problem:   Severe recurrent major depression without psychotic features (HCC) Active Problems:   Pregnancy (I trimester)   Cannabis use disorder, severe, dependence (HCC)   Tobacco use disorder   Asthma  Secondary Dx/Hospital Problem List Current Facility-Administered Medications  Medication Dose Route Frequency Provider Last Rate Last Dose  . acetaminophen (TYLENOL) tablet 650 mg  650 mg Oral Q6H PRN Audery AmelJohn T [name], MD   650 mg at 11/15/15 0601  . albuterol (PROVENTIL HFA;VENTOLIN HFA) 108 (90 Base) MCG/ACT inhaler 1-2 puff  1-2 puff Inhalation Q4H PRN Jimmy FootmanAndrea Hernandez-Gonzalez, MD      . alum & mag hydroxide-simeth (MAALOX/MYLANTA) 200-200-20 MG/5ML suspension 30 mL  30 mL Oral Q4H PRN Audery AmelJohn T [name], MD      . magnesium hydroxide (MILK OF MAGNESIA) suspension 30 mL  30 mL Oral Daily PRN Audery AmelJohn T [name], MD      . nicotine (NICODERM CQ - dosed in mg/24 hours) patch 14 mg  14 mg Transdermal Daily Jimmy FootmanAndrea Hernandez-Gonzalez, MD      . nitrofurantoin (macrocrystal-monohydrate) (MACROBID) capsule 100 mg  100 mg Oral Q12H Audery AmelJohn T [name], MD   100 mg at 11/15/15 0855  . prenatal multivitamin tablet 1 tablet  1 tablet Oral Daily Audery AmelJohn T [name], MD   1 tablet at 11/15/15 0855  . sertraline (ZOLOFT) tablet 50 mg  50 mg Oral Daily Audery AmelJohn T [name], MD   50 mg at 11/15/15 0855  . traZODone (DESYREL) tablet 100 mg  100 mg Oral QHS PRN Audery AmelJohn T [name], MD   100 mg at 11/14/15 2208     Current Meds Prescriptions Prior to Admission  Medication Sig Dispense Refill Last Dose  . albuterol (PROVENTIL HFA;VENTOLIN HFA) 108 (90 BASE) MCG/ACT inhaler Inhale 2 puffs into the lungs every 6 (six) hours as needed for wheezing or shortness of breath. 1 Inhaler 2 08/28/2015 at 1800  . azithromycin (ZITHROMAX Z-PAK) 250 MG tablet Take 2 tablets (500 mg) on  Day 1,  followed by 1 tablet (250  mg) once daily on Days 2 through 5. 6 each 0   . beclomethasone (QVAR) 40 MCG/ACT inhaler Inhale 1 puff into the lungs 2 (two) times daily.   08/29/2015 at 0730  . clindamycin (CLEOCIN) 150 MG capsule Take 3 capsules (450 mg total) by mouth 3 (three) times daily. 90 capsule 0   . cyclobenzaprine (FLEXERIL) 10 MG tablet Take 1 tablet (10 mg total) by mouth 3 (three) times daily as needed for muscle spasms. 30 tablet 0 PRN at PRN  . docusate sodium (COLACE) 100 MG capsule Take 1 capsule (100 mg total) by mouth 2 (two) times daily. (Patient not taking: Reported on 08/31/2015) 60 capsule 2 Not Taking at Unknown time  . Doxylamine-Pyridoxine 10-10 MG TBEC Take 2 tablets by mouth 2 (two) times daily. 60 tablet 1   . etonogestrel-ethinyl estradiol (NUVARING) 0.12-0.015 MG/24HR vaginal ring Insert vaginally and leave in place for 3 consecutive weeks, then remove for 1 week. (Patient not taking: Reported on 08/31/2015) 1 each 12 Not Taking at Unknown time  . ferrous sulfate 325 (65 FE) MG tablet Take 325 mg by mouth daily with breakfast.     . ibuprofen (ADVIL,MOTRIN) 800 MG tablet Take 1 tablet (800 mg total) by mouth 3 (three) times daily. (Patient not taking: Reported on 08/31/2015) 30 tablet 1 Not Taking at Unknown time  .  metoCLOPramide (REGLAN) 10 MG tablet Take 1 tablet (10 mg total) by mouth every 8 (eight) hours as needed for nausea. 30 tablet 1   . metoCLOPramide (REGLAN) 10 MG tablet Take 1 tablet (10 mg total) by mouth every 8 (eight) hours as needed for nausea or vomiting. 30 tablet 1   . nitrofurantoin, macrocrystal-monohydrate, (MACROBID) 100 MG capsule Take 1 capsule (100 mg total) by mouth 2 (two) times daily. X 7 days 14 capsule 0   . predniSONE (DELTASONE) 10 MG tablet Take 5 tablets (50 mg total) by mouth daily. 20 tablet 0   . promethazine (PHENERGAN) 25 MG suppository Place 1 suppository (25 mg total) rectally every 6 (six) hours as needed for nausea or vomiting. 12 each 0   . promethazine  (PHENERGAN) 25 MG tablet Take 1 tablet (25 mg total) by mouth every 6 (six) hours as needed for nausea or vomiting. 30 tablet 0      Prior to Admission Meds   Interdisciplinary Treatment and Diagnostic Plan Update  11/15/2015 Time of Session: 11:38 AM  Kristy Reid MRN: [id number]  Principal Diagnosis: Severe recurrent major depression without psychotic features (HCC)  Secondary Diagnoses: Principal Problem:   Severe recurrent major depression without psychotic features (HCC) Active Problems:   Pregnancy (I trimester)   Cannabis use disorder, severe, dependence (HCC)   Tobacco use disorder   Asthma   Current Medications:  Current Facility-Administered Medications  Medication Dose Route Frequency Provider Last Rate Last Dose  . acetaminophen (TYLENOL) tablet 650 mg  650 mg Oral Q6H PRN Audery Amel, MD   650 mg at 11/15/15 0601  . albuterol (PROVENTIL HFA;VENTOLIN HFA) 108 (90 Base) MCG/ACT inhaler 1-2 puff  1-2 puff Inhalation Q4H PRN Jimmy Footman, MD      . alum & mag hydroxide-simeth (MAALOX/MYLANTA) 200-200-20 MG/5ML suspension 30 mL  30 mL Oral Q4H PRN Audery Amel, MD      . magnesium hydroxide (MILK OF MAGNESIA) suspension 30 mL  30 mL Oral Daily PRN Audery Amel, MD      . nicotine (NICODERM CQ - dosed in mg/24 hours) patch 14 mg  14 mg Transdermal Daily Jimmy Footman, MD      . nitrofurantoin (macrocrystal-monohydrate) (MACROBID) capsule 100 mg  100 mg Oral Q12H Audery Amel, MD   100 mg at 11/15/15 0855  . prenatal multivitamin tablet 1 tablet  1 tablet Oral Daily Audery Amel, MD   1 tablet at 11/15/15 0855  . sertraline (ZOLOFT) tablet 50 mg  50 mg Oral Daily Audery Amel, MD   50 mg at 11/15/15 0855  . traZODone (DESYREL) tablet 100 mg  100 mg Oral QHS PRN Audery Amel, MD   100 mg at 11/14/15 2208    PTA Medications: Prescriptions Prior to Admission  Medication Sig Dispense Refill Last Dose  . albuterol (PROVENTIL HFA;VENTOLIN  HFA) 108 (90 BASE) MCG/ACT inhaler Inhale 2 puffs into the lungs every 6 (six) hours as needed for wheezing or shortness of breath. 1 Inhaler 2 08/28/2015 at 1800  . azithromycin (ZITHROMAX Z-PAK) 250 MG tablet Take 2 tablets (500 mg) on  Day 1,  followed by 1 tablet (250 mg) once daily on Days 2 through 5. 6 each 0   . beclomethasone (QVAR) 40 MCG/ACT inhaler Inhale 1 puff into the lungs 2 (two) times daily.   08/29/2015 at 0730  . clindamycin (CLEOCIN) 150 MG capsule Take 3 capsules (450 mg total) by mouth 3 (three)  times daily. 90 capsule 0   . cyclobenzaprine (FLEXERIL) 10 MG tablet Take 1 tablet (10 mg total) by mouth 3 (three) times daily as needed for muscle spasms. 30 tablet 0 PRN at PRN  . docusate sodium (COLACE) 100 MG capsule Take 1 capsule (100 mg total) by mouth 2 (two) times daily. (Patient not taking: Reported on 08/31/2015) 60 capsule 2 Not Taking at Unknown time  . Doxylamine-Pyridoxine 10-10 MG TBEC Take 2 tablets by mouth 2 (two) times daily. 60 tablet 1   . etonogestrel-ethinyl estradiol (NUVARING) 0.12-0.015 MG/24HR vaginal ring Insert vaginally and leave in place for 3 consecutive weeks, then remove for 1 week. (Patient not taking: Reported on 08/31/2015) 1 each 12 Not Taking at Unknown time  . ferrous sulfate 325 (65 FE) MG tablet Take 325 mg by mouth daily with breakfast.     . ibuprofen (ADVIL,MOTRIN) 800 MG tablet Take 1 tablet (800 mg total) by mouth 3 (three) times daily. (Patient not taking: Reported on 08/31/2015) 30 tablet 1 Not Taking at Unknown time  . metoCLOPramide (REGLAN) 10 MG tablet Take 1 tablet (10 mg total) by mouth every 8 (eight) hours as needed for nausea. 30 tablet 1   . metoCLOPramide (REGLAN) 10 MG tablet Take 1 tablet (10 mg total) by mouth every 8 (eight) hours as needed for nausea or vomiting. 30 tablet 1   . nitrofurantoin, macrocrystal-monohydrate, (MACROBID) 100 MG capsule Take 1 capsule (100 mg total) by mouth 2 (two) times daily. X 7 days 14 capsule 0    . predniSONE (DELTASONE) 10 MG tablet Take 5 tablets (50 mg total) by mouth daily. 20 tablet 0   . promethazine (PHENERGAN) 25 MG suppository Place 1 suppository (25 mg total) rectally every 6 (six) hours as needed for nausea or vomiting. 12 each 0   . promethazine (PHENERGAN) 25 MG tablet Take 1 tablet (25 mg total) by mouth every 6 (six) hours as needed for nausea or vomiting. 30 tablet 0     Treatment Modalities: Medication Management, Group therapy, Case management,  1 to 1 session with clinician, Psychoeducation, Recreational therapy.   Physician Treatment Plan for Primary Diagnosis: Severe recurrent major depression without psychotic features (HCC) Long Term Goal(s): Improvement in symptoms so as ready for discharge  Short Term Goals: Ability to verbalize feelings will improve, Ability to disclose and discuss suicidal ideas and Ability to identify and develop effective coping behaviors will improve  Medication Management: Evaluate patient's response, side effects, and tolerance of medication regimen.  Therapeutic Interventions: 1 to 1 sessions, Unit Group sessions and Medication administration.  Evaluation of Outcomes: Progressing  Physician Treatment Plan for Secondary Diagnosis: Principal Problem:   Severe recurrent major depression without psychotic features (HCC) Active Problems:   Pregnancy (I trimester)   Cannabis use disorder, severe, dependence (HCC)   Tobacco use disorder   Asthma   Long Term Goal(s): Improvement in symptoms so as ready for discharge  Short Term Goals: Ability to identify changes in lifestyle to reduce recurrence of condition will improve and Ability to identify triggers associated with substance abuse/mental health issues will improve  Medication Management: Evaluate patient's response, side effects, and tolerance of medication regimen.  Therapeutic Interventions: 1 to 1 sessions, Unit Group sessions and Medication administration.  Evaluation of  Outcomes: Progressing   RN Treatment Plan for Primary Diagnosis: Severe recurrent major depression without psychotic features (HCC) Long Term Goal(s): Knowledge of disease and therapeutic regimen to maintain health will improve  Short Term Goals: Ability  to participate in decision making will improve and Ability to verbalize feelings will improve  Medication Management: RN will administer medications as ordered by provider, will assess and evaluate patient's response and provide education to patient for prescribed medication. RN will report any adverse and/or side effects to prescribing provider.  Therapeutic Interventions: 1 on 1 counseling sessions, Psychoeducation, Medication administration, Evaluate responses to treatment, Monitor vital signs and CBGs as ordered, Perform/monitor CIWA, COWS, AIMS and Fall Risk screenings as ordered, Perform wound care treatments as ordered.  Evaluation of Outcomes: Progressing   LCSW Treatment Plan for Primary Diagnosis: Severe recurrent major depression without psychotic features (HCC) Long Term Goal(s): Safe transition to appropriate next level of care at discharge, Engage patient in therapeutic group addressing interpersonal concerns.  Short Term Goals: Engage patient in aftercare planning with referrals and resources, Increase ability to appropriately verbalize feelings, Identify triggers associated with mental health/substance abuse issues and Increase skills for wellness and recovery  Therapeutic Interventions: Assess for all discharge needs, 1 to 1 time with Social worker, Explore available resources and support systems, Assess for adequacy in community support network, Educate family and significant other(s) on suicide prevention, Complete Psychosocial Assessment, Interpersonal group therapy.  Evaluation of Outcomes: Progressing   Progress in Treatment: Attending groups: Yes Participating in groups: Yes Taking medication as prescribed: Yes, MD  continues to assess for medication changes as needed Toleration medication: Yes, no side effects reported at this time Family/Significant other contact made:  Patient understands diagnosis:  Discussing patient identified problems/goals with staff: Yes Medical problems stabilized or resolved: Yes Denies suicidal/homicidal ideation:  Issues/concerns per patient self-inventory: None Other: N/A  New problem(s) identified: None identified at this time.   New Short Term/Long Term Goal(s): None identified at this time.   Discharge Plan or Barriers:   Reason for Continuation of Hospitalization: Anxiety Delusions  Depression Hallucinations Homicidal ideation [place] Medical Issues Medication stabilization Suicidal ideation Withdrawal symptoms  Estimated Length of Stay: 3-5 days  Attendees: Patient: Kristy Reid  11/15/2015  11:38 AM  Physician: Radene Journey 11/15/2015  11:38 AM  Nursing: Shelia Media 11/15/2015  11:38 AM  RN Care Manager: 11/15/2015  11:38 AM  Social Worker: Hampton Abbot 11/15/2015  11:38 AM  Recreational Therapist:  11/15/2015  11:38 AM  Other: PA student, Sophia 11/15/2015  11:38 AM  Other:  11/15/2015  11:38 AM  Other: 11/15/2015  11:38 AM    Scribe for Treatment Team: Lynden Oxford, MSW, LCSW-A

## 2015-11-15 NOTE — [facility] Group Notes (Signed)
[facility] LCSW Group Therapy  11/15/2015 11:26 AM  Type of Therapy:  Group Therapy  Participation Level:  Active  Participation Quality:  Attentive and Sharing  Affect:  Appropriate  Cognitive:  Alert  Insight:  Engaged  Engagement in Therapy:  Engaged  Modes of Intervention:  Activity, Discussion, Education and Support  Summary of Progress/Problems: Feelings around Relapse. Group members discussed the meaning of relapse and shared personal stories of relapse, how it affected them and others, and how they perceived themselves during this time. Group members were encouraged to identify triggers, warning signs and coping skills used when facing the possibility of relapse. Social supports were discussed and explored in detail. Patients also discussed facing disappointment and how that can trigger someone to relapse. Patient stated she needs to develop coping skills to help with her negative emotions.   Kristy Florentino G. Garnette CzechSampson MSW, LCSWA 11/15/2015, 11:40 AM
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
## 2015-11-16 DIAGNOSIS — I4581 Long QT syndrome: Secondary | ICD-10-CM

## 2015-11-16 LAB — HEMOGLOBIN A1C: Hgb A1c MFr Bld: 5.1 % (ref 4.0–6.0)

## 2015-11-16 MED ORDER — DIPHENHYDRAMINE HCL 25 MG PO CAPS
50.0000 mg | ORAL_CAPSULE | Freq: Every evening | ORAL | Status: DC
  Administered 2015-11-16: 50 mg via ORAL
  Filled 2015-11-16: qty 2

## 2015-11-16 MED ORDER — QUETIAPINE FUMARATE 25 MG PO TABS: 25.0000 mg | ORAL_TABLET | Freq: Every day | ORAL | Status: DC

## 2015-11-16 MED ORDER — METRONIDAZOLE 0.75 % VA GEL
1.0000 | Freq: Every day | VAGINAL | Status: AC
  Administered 2015-11-16 – 2015-11-18 (×3): 1 via VAGINAL
  Filled 2015-11-16: qty 70

## 2015-11-16 MED ORDER — METRONIDAZOLE 0.75 % EX GEL: Freq: Two times a day (BID) | CUTANEOUS | Status: DC

## 2015-11-16 MED ORDER — SERTRALINE HCL 50 MG PO TABS
25.0000 mg | ORAL_TABLET | Freq: Every day | ORAL | Status: DC
  Administered 2015-11-17 – 2015-11-18 (×2): 25 mg via ORAL
  Filled 2015-11-16: qty 0.5
  Filled 2015-11-16: qty 1

## 2015-11-16 NOTE — Progress Notes (Signed)
D:Pt presents pleasant and calm this am. Reports sleeping well with prescribed medication. Reports appetite being fair, with 1 episode of nausea this am.Denies any pain. Reports feeling much better although saying depression 9/10, anxiety 10/10. Patient reports antibiotic causing itching, odor and discharge. A: Encouragement and support offered. Md notified, new orders received. Medications given as prescribed. Prn given with good relief.  R: Pt receptive. Denies SI, HI, AVH. Safety maintained on unit with q 15 min checks.

## 2015-11-16 NOTE — [facility] Group Notes (Signed)
[facility] Group Notes:  (Nursing/MHT/Case Management/Adjunct)  Date:  11/16/2015  Time:  5:31 AM  Type of Therapy:  Psychoeducational Skills  Participation Level:  Active  Participation Quality:  Appropriate, Attentive, Sharing and Supportive  Affect:  Appropriate  Cognitive:  Appropriate  Insight:  Appropriate and Good  Engagement in Group:  Engaged  Modes of Intervention:  Discussion, Socialization and Support  Summary of Progress/Problems:  Kristy Reid 11/16/2015, 5:31 AM

## 2015-11-16 NOTE — Plan of Care (Signed)
Problem: Activity: Goal: Sleeping patterns will improve Outcome: Progressing Pt has been sleeping at least 6.45 hrs at night.

## 2015-11-16 NOTE — Plan of Care (Signed)
Problem: Activity: Goal: Interest or engagement in leisure activities will improve Outcome: Progressing Patient interacting and conversing with peers. Noted playing cards and participating In group activity
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
## 2015-11-16 NOTE — [facility] Group Notes (Signed)
[facility] LCSW Group Therapy  11/16/2015 3:56 PM  Type of Therapy:  Group Therapy  Participation Level:  Active  Participation Quality:  Attentive and Sharing  Affect:  Appropriate  Cognitive:  Alert  Insight:  Improving  Engagement in Therapy:  Improving  Modes of Intervention:  Activity, Clarification, Discussion, Problem-solving and Support  Summary of Progress/Problems:Protective Factors: Patients defined protective factors and discussed the importance recognizing their personal strengths. Patients identified their own protective factors and resiliency. Patients discussed building upon those protective factors to improve their ability to cope with life challenges. Patient expressed frustrations to CSW about being pregnant and not physically feeling well. CSW provided support to patient and encouraged patient to inform nursing staff of her symptoms.   Emory Gallentine G. Garnette CzechSampson MSW, LCSWA 11/16/2015, 4:00 PM

## 2015-11-17 MED ORDER — QUETIAPINE FUMARATE 25 MG PO TABS
25.0000 mg | ORAL_TABLET | Freq: Every day | ORAL | Status: DC
  Administered 2015-11-17 – 2015-11-19 (×3): 25 mg via ORAL
  Filled 2015-11-17 (×3): qty 1

## 2015-11-17 MED ORDER — METRONIDAZOLE 0.75 % EX CREA
TOPICAL_CREAM | Freq: Two times a day (BID) | CUTANEOUS | Status: DC
  Administered 2015-11-17 – 2015-11-19 (×4): via TOPICAL
  Filled 2015-11-17: qty 45

## 2015-11-17 NOTE — Progress Notes (Signed)
Pt remains on 1:1 without incident. In the day room interacting with both peers and staff.

## 2015-11-17 NOTE — Progress Notes (Signed)
   Notified by nursing shortly after midnight that the patient was having active suicidal thoughts and would not contract for safety. Will place with a 1:1 sitter for safety.

## 2015-11-17 NOTE — Progress Notes (Signed)
Pt still endorsing SI, will not contract for safety at this time. Endorses AH with command telling her to harm self. Denies HI/VH. Staff states that pt. Was overheard telling another female peer that she was going to act out in order to get staff to allow them to room together. Pt stated in group that she also did not want to go home. Medication compliant. Affect labile. Pt was also acting  confused one minute and then socializing and laughing with peers in dayroom the next. Safety maintained. Will continue to monitor.

## 2015-11-17 NOTE — Progress Notes (Signed)
Pt resting in bed with eyes closed. Sitter present. 

## 2015-11-17 NOTE — Progress Notes (Addendum)
    []  Hide copied text Pt remains on 1:1 without incident. In the day room interacting with both peers and staff     Pt completed her self inventory staing that she was suicidal and did not want to live. Pt is still unable to contract for safety

## 2015-11-17 NOTE — [facility] Group Notes (Signed)
[facility] LCSW Group Therapy  11/17/2015 2:38 PM  Type of Therapy:  Group Therapy  Participation Level:  Active  Participation Quality:  Attentive  Affect:  Appropriate  Cognitive:  Appropriate  Insight:  Improving  Engagement in Therapy:  Engaged  Modes of Intervention:  Activity, Discussion and Support  Summary of Progress/Problems:Coping Skills: Patients defined and discussed healthy coping skills. Patients identified healthy coping skills they would like to try during hospitalization and after discharge. CSW offered insight to varying coping skills that may have been new to patients such as practicing mindfulness. Patient stated "I'm not too good today because I have a sitter". Patient stated she was drowsy and wanted to go to sleep. Patient remained in group for the entire time and was supportive to her peers who shared with the group.   Kristy Reid G. Garnette CzechSampson MSW, LCSWA 11/17/2015, 2:38 PM

## 2015-11-17 NOTE — Plan of Care (Signed)
Problem: Coping: Goal: Ability to cope will improve Outcome: Not Progressing Pt could not contract for safety tonight. 1:1 sitter was placed with Pt for Suicide Risk.

## 2015-11-17 NOTE — Progress Notes (Signed)
Pt remains on 1:1 without incident. In the day room interacting with both peers and staff. 

## 2015-11-17 NOTE — Progress Notes (Signed)
Patient ID: Kristy Reid, female   DOB: Mar 10, 1996, 20 y.o.   MRN: [id number]  Patient requested to speak with CSW during group. CSW met with patient after group. Patient expressed to CSW that she is still experiencing suicidal thoughts and wants to hurt herself. CSW processed with patient and talked about safety planning. Patient stated she is unable to discuss safety planning because  "I just feel like I want to die every second". CSW informed patient that she would let the physician know that she is having suicidal ideations. CSW notified physician. Patient is on one to one with a sitter.   Ron Junco G. [place], [facility] 11/17/2015 4:37 PM
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
## 2015-11-17 NOTE — Progress Notes (Signed)
    []  Hide copied text Pt remains on 1:1 without incident. In the day room interacting with both peers and staff

## 2015-11-17 NOTE — Progress Notes (Signed)
    []  Hide copied text Pt remains on 1:1 without incident. In the day room interacting with both peers and staff      

## 2015-11-17 NOTE — Progress Notes (Signed)
Pt on 1;1 observation to ensure pt safety. Pt continues to endorse having SI without a plan. Pt has been in the day room interacting with both peers and staff. Pt's mood and affect has been depressed. Pt unable to contract for safety.

## 2015-11-17 NOTE — Progress Notes (Signed)
11/17/2015 1:52 PM      [] Hide copied text Pt remains on 1:1 without incident. In the day room interacting with both peers and staff

## 2015-11-17 NOTE — [facility] Group Notes (Signed)
[facility] Group Notes:  (Nursing/MHT/Case Management/Adjunct)  Date:  11/17/2015  Time:  3:50 AM  Type of Therapy:  Psychoeducational Skills  Participation Level:  Active  Participation Quality:  Appropriate  Affect:  Appropriate  Cognitive:  Appropriate  Insight:  Appropriate  Engagement in Group:  Engaged  Modes of Intervention:  Discussion, Socialization and Support  Summary of Progress/Problems:  Chancy MilroyLaquanda Y Rheta Hemmelgarn 11/17/2015, 3:50 AM

## 2015-11-17 NOTE — [facility] Group Notes (Addendum)
[facility] Group Notes:  (Nursing/MHT/Case Management/Adjunct)  Date:  11/17/2015  Time:  9:49 PM  Type of Therapy:  Psychoeducational Skills  Participation Level:  Active  Participation Quality:  Appropriate, Sharing and Supportive  Affect:  Appropriate  Cognitive:  Appropriate  Insight:  Appropriate and Good  Engagement in Group:  Engaged and Supportive  Modes of Intervention:  Discussion, Socialization and Support  Summary of Progress/Problems: Patient stated in group that she wanted to overcome suicidal thoughts as her goal.  Chancy MilroyLaquanda Y Tyron Manetta 11/17/2015, 9:49 PM

## 2015-11-17 NOTE — Progress Notes (Signed)
Notified by nursing that the patient was making suicidal threats and would not contract for safety. 1:1 sitter placed for safety.  Due to bacterial vaginosis, metronidazole gel was added.

## 2015-11-17 NOTE — Progress Notes (Signed)
Pt awake resting in bed after vitals. Sitter present.

## 2015-11-17 NOTE — Progress Notes (Signed)
Pt endorses active SI without specific plan but stated "I am impulsive. I have broken my glasses before". Pt had wanted to room with a peer who was also endorsing passive SI but was able to contract for safety with her RN. Pts were informed that they would not be "rooming together". Pt was unable to contract for safety and stated that "I know if I die, my baby will die, but I don't even care" " I want to room with my peer so we can help each other". Pt affect flat. Orders were obtained for 1:1 sitter for suicide precautions. Safety maintained. Will continue to monitor.

## 2015-11-18 LAB — PROLACTIN: PROLACTIN: 67.6 ng/mL — AB (ref 4.8–23.3)

## 2015-11-18 MED ORDER — SERTRALINE HCL 50 MG PO TABS
50.0000 mg | ORAL_TABLET | Freq: Every day | ORAL | Status: DC
  Administered 2015-11-19 – 2015-11-20 (×2): 50 mg via ORAL
  Filled 2015-11-18 (×2): qty 1

## 2015-11-18 MED ORDER — NICOTINE 21 MG/24HR TD PT24
21.0000 mg | MEDICATED_PATCH | Freq: Every day | TRANSDERMAL | Status: DC
  Administered 2015-11-19 – 2015-11-20 (×2): 21 mg via TRANSDERMAL
  Filled 2015-11-18 (×2): qty 1

## 2015-11-18 MED ORDER — DOCUSATE SODIUM 100 MG PO CAPS
100.0000 mg | ORAL_CAPSULE | Freq: Two times a day (BID) | ORAL | Status: DC
  Administered 2015-11-18 – 2015-11-20 (×4): 100 mg via ORAL
  Filled 2015-11-18 (×5): qty 1

## 2015-11-18 NOTE — Progress Notes (Signed)
Dayroom  Conversing with peers , 1:1 present

## 2015-11-18 NOTE — Progress Notes (Signed)
Playing cards with her peers , 1:1 present .

## 2015-11-18 NOTE — Progress Notes (Addendum)
Remains in courtyard  With peers 1:1 present

## 2015-11-18 NOTE — Progress Notes (Signed)
D; Out side group . 1:1 present , voice no concerns .

## 2015-11-18 NOTE — Progress Notes (Signed)
Patient remained in dayroom  conversation with peers 1:1 present

## 2015-11-18 NOTE — Progress Notes (Signed)
Patient resting in bed this am shift  Stated she didn't feel  Like getting out of  Bed  continue t endorse suicidal  Ideations

## 2015-11-18 NOTE — Plan of Care (Signed)
Problem: Safety: Goal: Ability to remain free from injury will improve Outcome: Not Progressing Patient voiced of suicidal thoughts  No acting out  , remained on 1:1

## 2015-11-18 NOTE — [facility] Group Notes (Signed)
[facility] Group Notes:  (Nursing/MHT/Case Management/Adjunct)  Date:  11/18/2015  Time:  9:42 PM  Type of Therapy:  Evening Wrap-up Group  Participation Level:  Active  Participation Quality:  Appropriate and Attentive  Affect:  Appropriate  Cognitive:  Alert and Appropriate  Insight:  Appropriate, Good and Improving  Engagement in Group:  Developing/Improving and Engaged  Modes of Intervention:  Activity  Summary of Progress/Problems:  Tomasita MorrowChelsea Nanta Caycee Wanat 11/18/2015, 9:42 PM

## 2015-11-18 NOTE — Progress Notes (Signed)
Out of room ambulating about  On unit  compliant with medication 1:1 present .

## 2015-11-18 NOTE — Progress Notes (Signed)
Medication room  , requesting tylenol for headache , 1:1 present
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
## 2015-11-18 NOTE — Progress Notes (Signed)
D: Patient stated slept fair last night .Stated appetite is good and energy level  Is normal. Stated concentration is good . Stated on Depression scale 9, hopeless9 and anxiety 9.( low 0-10 high)   .  No auditory hallucinations  No pain concerns . Appropriate ADL'S. Interacting with peers and staff.  Patient voice of being suicidal  With thoughts  All day . Writer has witness patient  During the entire shift  Laughing and smiling  Stated she is not contracting for safety. A: Encourage patient participation with unit programming . Instruction  Given on  Medication , verbalize understanding. R: Voice no other concerns. Staff continue to monitor

## 2015-11-18 NOTE — Progress Notes (Signed)
Patient in dayroom , conversing  With peers  1:1 present

## 2015-11-18 NOTE — Progress Notes (Signed)
Patient in courtyard  With 1:1 voice no concerns.

## 2015-11-18 NOTE — [facility] Group Notes (Signed)
[facility]  11/18/2015 3:23 PM  Participation Level:  Active  Description of Group:    In this group patients will be encouraged to explore what they see as obstacles to their own wellness and recovery. They will be guided to discuss their thoughts, feelings, and behaviors related to these obstacles. The group will process together ways to cope with barriers, with attention given to specific choices patients can make. Each patient will be challenged to identify changes they are motivated to make in order to overcome their obstacles. This group will be process-oriented, with patients participating in exploration of their own experiences as well as giving and receiving support and challenge from other group members.  Therapeutic Goals: 1. Patient will identify personal and current obstacles as they relate to admission. 2. Patient will identify barriers that currently interfere with their wellness or overcoming obstacles.  3. Patient will identify feelings, thought process and behaviors related to these barriers. 4. Patient will identify two changes they are willing to make to overcome these obstacles:    Summary of Patient Progress Pt  able to meet therapeutic goals.  Group discussed qualities of resilience or persistence that could benefit recovery efforts and overcoming the obstacles faced.  Therapeutic Modalities:   Cognitive Behavioral Therapy Solution Focused Therapy Motivational Interviewing Relapse Prevention Therapy   Glennon MacSara P Hopelynn Gartland, MSW, LCSW 11/18/2015, 3:23 PM

## 2015-11-18 NOTE — Progress Notes (Signed)
In dayroom  Sitting with her peers . Affect cheerful laughing aloud  1:1 sitting at table

## 2015-11-18 NOTE — Progress Notes (Signed)
Sitting  In dayroom  With peers in conversation 1:1  Present.

## 2015-11-19 MED ORDER — PRENATAL PLUS 27-1 MG PO TABS
1.0000 | ORAL_TABLET | Freq: Every day | ORAL | 0 refills | Status: DC
Start: 2015-11-19 — End: 2015-12-26

## 2015-11-19 MED ORDER — QUETIAPINE FUMARATE 25 MG PO TABS: 25.0000 mg | ORAL_TABLET | Freq: Every day | ORAL | 0 refills | Status: DC

## 2015-11-19 MED ORDER — CLINDAMYCIN HCL 150 MG PO CAPS: 450.0000 mg | ORAL_CAPSULE | Freq: Three times a day (TID) | ORAL | 0 refills | Status: DC

## 2015-11-19 MED ORDER — SERTRALINE HCL 50 MG PO TABS: 50.0000 mg | ORAL_TABLET | Freq: Every day | ORAL | 0 refills | Status: DC

## 2015-11-19 MED ORDER — METRONIDAZOLE 0.75 % EX CREA: TOPICAL_CREAM | Freq: Two times a day (BID) | CUTANEOUS | 0 refills | Status: AC
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
## 2015-11-19 NOTE — Progress Notes (Addendum)
VSS, "I have been laughing, I feel awesome, I feel great, I can contract for safety, I am pregnant and will not harm my babe, you can ask anyone, I am doing great .Marland Kitchen..."  Almost as Euphoric. Patient remains on 1:1 Arms Length for safety. c/o N&V, promethazine 25 mg PO PRN given with bedtime medications.

## 2015-11-19 NOTE — Plan of Care (Signed)
Problem: Coping: Goal: Ability to verbalize feelings will improve Outcome: Progressing Open and freely expressing feelings of improvements; will continue to encourage +feelings and provide clinical support.

## 2015-11-19 NOTE — Plan of Care (Signed)
Problem: Education: Goal: Ability to make informed decisions regarding treatment will improve Outcome: Progressing Patient actively participating with plan of care. Meets frequently for check in with writer and to discuss coping skills in use.

## 2015-11-19 NOTE — [facility] Group Notes (Signed)
Goals Group  Date/Time: 9:00AM Type of Therapy and Topic: Group Therapy: Goals Group: SMART Goals  ?  Participation Level: Moderate  ?  Description of Group:  ?  The purpose of a daily goals group is to assist and guide patients in setting recovery/wellness-related goals. The objective is to set goals as they relate to the crisis in which they were admitted. Patients will be using SMART goal modalities to set measurable goals. Characteristics of realistic goals will be discussed and patients will be assisted in setting and processing how one will reach their goal. Facilitator will also assist patients in applying interventions and coping skills learned in psycho-education groups to the SMART goal and process how one will achieve defined goal.  ?  Therapeutic Goals:  ?  -Patients will develop and document one goal related to or their crisis in which brought them into treatment.  -Patients will be guided by LCSW using SMART goal setting modality in how to set a measurable, attainable, realistic and time sensitive goal.  -Patients will process barriers in reaching goal.  -Patients will process interventions in how to overcome and successful in reaching goal.  ?  Patient's Goal: Patient stated her goal was to have better thoughts and not acting on suicidal ideation. In order to accomplish goal patient believes that not being isolated would be helpful. ?  Therapeutic Modalities:  Motivational Interviewing  Cognitive Behavioral Therapy  Crisis Intervention Model  SMART goals setting   Hampton AbbotKadijah Elisabeth Strom, MSW, LCSW-A 11/19/2015, 10:23AM

## 2015-11-19 NOTE — [facility] Group Notes (Signed)
[facility] LCSW Group Therapy   11/19/2015 9:30am  Type of Therapy: Group Therapy   Participation Level: Active   Participation Quality: Attentive, Sharing and Supportive   Affect: Appropriate  Cognitive: Alert and Oriented   Insight: Developing/Improving and Engaged   Engagement in Therapy: Developing/Improving and Engaged   Modes of Intervention: Clarification, Confrontation, Discussion, Education, Exploration,  Limit-setting, Orientation, Problem-solving, Rapport Building, Dance movement psychotherapisteality Testing, Socialization and Support  Summary of Progress/Problems: The topic for group therapy was feelings about diagnosis. Pt actively participated in group discussion on their past and current diagnosis and how they feel towards this. Pt also identified how society and family members judge them, based on their diagnosis as well as stereotypes and stigmas. Patient stated that her goal is to remain positive and keep clear thoughts. She stated that she becomes suicidal when she is isolated from others.   Hampton AbbotKadijah Abigale Dorow, MSW, Theresia MajorsLCSWA

## 2015-11-19 NOTE — Progress Notes (Signed)
Patient with appropriate affect, cooperative behavior with meals, meds and plan of care. No SI/SH/HI at this time. Patient speaks with Clinical research associatewriter and agrees on several coping skills that patient can use. Patient agrees to speak with Clinical research associatewriter, journal, deep breathe and ambulate. Patient attends therapy group where peer annoys her and patient is able to discuss with Clinical research associatewriter. Social with select female peer. Safety maintained.

## 2015-11-20 NOTE — Progress Notes (Addendum)
  [facility] :  Will you be returning to the same living situation after discharge:  Yes,  [place] At discharge, do you have transportation home?: Yes,  pt willl be picked up by her family Do you have the ability to pay for your medications: Yes,  pt will be provided with prescriptions at discharge  Release of information consent forms completed and in the chart;  Patient's signature needed at discharge.  Patient to Follow up at: Follow-up Information    [facility].   Why:  Sept Monday 11 at 3:30 pm with Dr Levi AlandStaebler  Contact information: [address] [place] [address] [phone]        [facility] .   Why:  Please arrive to the walk-in clinic on Monday Sept 11th at 7am to meet with Unk PintoHarvey Bryant for an assessment for medication management, substance abuse treatment and therapy. Please call Unk PintoHarvey Bryant at [phone] for questions and assistance. Contact information: [address] [place] [address] [phone]           Next level of care provider has access to [facility] Link:no  Safety Planning and Suicide Prevention discussed: Yes,  completed with pt     Has patient been referred to the Quitline?: Patient refused referral  Patient has been referred for addiction treatment: Yes  Dorothe PeaJonathan F Samantha Ragen 11/20/2015, 4:24 PM

## 2015-11-20 NOTE — Progress Notes (Signed)
D: Observed up on unit this morning interacting appropriately with peers. Endorses passive SI with no plan, verbally contracts for safety. Reports of SI incongruent with pt's behaviors. Pt approaches peers and staff with smile, fair eye contact, child-like at times, euphoric. Also endorses AH telling /her to "hurt self." Rates depression 7/10 , hopelessness 8/10, anxiety 10/10. When asked about discharge plan, pt states she "will go somewhere long term," but her discharge plan as reported by other staff is to stay with her boyfriend's sister.   A: Provided support and encouragement. Administered medications as ordered. Encouraged continued use of coping skills to manage stress. Every 15 minute safety checks.   R: Cooperative, hyperactive, euphoric, but reports being "sleepy" Redirectable. Medication compliant except refused vaginal cream as ordered. Safety maintained.

## 2015-11-20 NOTE — Plan of Care (Signed)
Problem: Coping: Goal: Ability to cope will improve Outcome: Progressing Pt talked about using coping skills to manage anger and agitation from encounter with peer.

## 2015-11-20 NOTE — Progress Notes (Signed)
Denies SI/HI/AVH. Discharged home with boyfriend's sister.

## 2015-11-20 NOTE — Tx Team (Signed)
Kristy Reid  Pt name [id number]    Severe recurrent major depression without psychotic features (HCC)  Principal Problem Principal Problem:   Severe recurrent major depression without psychotic features (HCC) Active Problems:   Pregnancy (I trimester)   Cannabis use disorder, severe, dependence (HCC)   Tobacco use disorder   Asthma   Borderline personality disorder   PTSD (post-traumatic stress disorder)  Secondary Dx/Hospital Problem List Current Facility-Administered Medications  Medication Dose Route Frequency Provider Last Rate Last Dose  . acetaminophen (TYLENOL) tablet 650 mg  650 mg Oral Q6H PRN Audery AmelJohn T [name], MD   650 mg at 11/19/15 2022  . albuterol (PROVENTIL HFA;VENTOLIN HFA) 108 (90 Base) MCG/ACT inhaler 1-2 puff  1-2 puff Inhalation Q4H PRN Jimmy FootmanAndrea Hernandez-Gonzalez, MD      . alum & mag hydroxide-simeth (MAALOX/MYLANTA) 200-200-20 MG/5ML suspension 30 mL  30 mL Oral Q4H PRN Audery AmelJohn T [name], MD      . clindamycin (CLEOCIN) capsule 450 mg  450 mg Oral Q8H Darliss RidgelAarti K Kapur, MD   450 mg at 11/20/15 [id number]  . docusate sodium (COLACE) capsule 100 mg  100 mg Oral BID Jimmy FootmanAndrea Hernandez-Gonzalez, MD   100 mg at 11/20/15 [id number]  . magnesium hydroxide (MILK OF MAGNESIA) suspension 30 mL  30 mL Oral Daily PRN Audery AmelJohn T [name], MD      . metroNIDAZOLE (METROCREAM) 0.75 % cream   Topical BID Darliss RidgelAarti K Kapur, MD      . nicotine (NICODERM CQ - dosed in mg/24 hours) patch 21 mg  21 mg Transdermal Daily Jimmy FootmanAndrea Hernandez-Gonzalez, MD   21 mg at 11/20/15 [id number]  . ondansetron (ZOFRAN) tablet 4 mg  4 mg Oral Q8H PRN Jimmy FootmanAndrea Hernandez-Gonzalez, MD   4 mg at 11/17/15 0809  . prenatal vitamin w/FE, FA (PRENATAL 1 + 1) 27-1 MG tablet 1 tablet  1 tablet Oral Daily Audery AmelJohn T [name], MD   1 tablet at 11/20/15 [phone] [id number]  . promethazine (PHENERGAN) tablet 25 mg  25 mg Oral Daily PRN Jimmy FootmanAndrea Hernandez-Gonzalez, MD   25 mg at 11/19/15 2123  . QUEtiapine (SEROQUEL) tablet 25 mg  25 mg Oral QHS Darliss RidgelAarti K Kapur, MD   25 mg at  11/19/15 2123  . sertraline (ZOLOFT) tablet 50 mg  50 mg Oral Daily Jimmy FootmanAndrea Hernandez-Gonzalez, MD   50 mg at 11/20/15 [id number]     Current Meds Prescriptions Prior to Admission  Medication Sig Dispense Refill Last Dose  . albuterol (PROVENTIL HFA;VENTOLIN HFA) 108 (90 BASE) MCG/ACT inhaler Inhale 2 puffs into the lungs every 6 (six) hours as needed for wheezing or shortness of breath. 1 Inhaler 2 08/28/2015 at 1800  . azithromycin (ZITHROMAX Z-PAK) 250 MG tablet Take 2 tablets (500 mg) on  Day 1,  followed by 1 tablet (250 mg) once daily on Days 2 through 5. 6 each 0   . beclomethasone (QVAR) 40 MCG/ACT inhaler Inhale 1 puff into the lungs 2 (two) times daily.   08/29/2015 at 0730  . clindamycin (CLEOCIN) 150 MG capsule Take 3 capsules (450 mg total) by mouth 3 (three) times daily. 90 capsule 0   . cyclobenzaprine (FLEXERIL) 10 MG tablet Take 1 tablet (10 mg total) by mouth 3 (three) times daily as needed for muscle spasms. 30 tablet 0 PRN at PRN  . docusate sodium (COLACE) 100 MG capsule Take 1 capsule (100 mg total) by mouth 2 (two) times daily. (Patient not taking: Reported on 08/31/2015) 60 capsule 2 Not Taking at Unknown time  . Doxylamine-Pyridoxine  10-10 MG TBEC Take 2 tablets by mouth 2 (two) times daily. 60 tablet 1   . etonogestrel-ethinyl estradiol (NUVARING) 0.12-0.015 MG/24HR vaginal ring Insert vaginally and leave in place for 3 consecutive weeks, then remove for 1 week. (Patient not taking: Reported on 08/31/2015) 1 each 12 Not Taking at Unknown time  . ferrous sulfate 325 (65 FE) MG tablet Take 325 mg by mouth daily with breakfast.     . ibuprofen (ADVIL,MOTRIN) 800 MG tablet Take 1 tablet (800 mg total) by mouth 3 (three) times daily. (Patient not taking: Reported on 08/31/2015) 30 tablet 1 Not Taking at Unknown time  . metoCLOPramide (REGLAN) 10 MG tablet Take 1 tablet (10 mg total) by mouth every 8 (eight) hours as needed for nausea. 30 tablet 1   . metoCLOPramide (REGLAN) 10 MG tablet Take  1 tablet (10 mg total) by mouth every 8 (eight) hours as needed for nausea or vomiting. 30 tablet 1   . nitrofurantoin, macrocrystal-monohydrate, (MACROBID) 100 MG capsule Take 1 capsule (100 mg total) by mouth 2 (two) times daily. X 7 days 14 capsule 0   . predniSONE (DELTASONE) 10 MG tablet Take 5 tablets (50 mg total) by mouth daily. 20 tablet 0   . promethazine (PHENERGAN) 25 MG suppository Place 1 suppository (25 mg total) rectally every 6 (six) hours as needed for nausea or vomiting. 12 each 0   . promethazine (PHENERGAN) 25 MG tablet Take 1 tablet (25 mg total) by mouth every 6 (six) hours as needed for nausea or vomiting. 30 tablet 0      Prior to Admission Meds   Interdisciplinary Treatment and Diagnostic Plan Update  11/20/2015 Time of Session: 11:50 AM  Kristy Reid MRN: [id number]  Principal Diagnosis: Severe recurrent major depression without psychotic features (HCC)  Secondary Diagnoses: Principal Problem:   Severe recurrent major depression without psychotic features (HCC) Active Problems:   Pregnancy (I trimester)   Cannabis use disorder, severe, dependence (HCC)   Tobacco use disorder   Asthma   Borderline personality disorder   PTSD (post-traumatic stress disorder)   Current Medications:  Current Facility-Administered Medications  Medication Dose Route Frequency Provider Last Rate Last Dose  . acetaminophen (TYLENOL) tablet 650 mg  650 mg Oral Q6H PRN Audery Amel, MD   650 mg at 11/19/15 2022  . albuterol (PROVENTIL HFA;VENTOLIN HFA) 108 (90 Base) MCG/ACT inhaler 1-2 puff  1-2 puff Inhalation Q4H PRN Jimmy Footman, MD      . alum & mag hydroxide-simeth (MAALOX/MYLANTA) 200-200-20 MG/5ML suspension 30 mL  30 mL Oral Q4H PRN Audery Amel, MD      . clindamycin (CLEOCIN) capsule 450 mg  450 mg Oral Q8H Darliss Ridgel, MD   450 mg at 11/20/15 4098  . docusate sodium (COLACE) capsule 100 mg  100 mg Oral BID Jimmy Footman, MD   100 mg at  11/20/15 1191  . magnesium hydroxide (MILK OF MAGNESIA) suspension 30 mL  30 mL Oral Daily PRN Audery Amel, MD      . metroNIDAZOLE (METROCREAM) 0.75 % cream   Topical BID Darliss Ridgel, MD      . nicotine (NICODERM CQ - dosed in mg/24 hours) patch 21 mg  21 mg Transdermal Daily Jimmy Footman, MD   21 mg at 11/20/15 4782  . ondansetron (ZOFRAN) tablet 4 mg  4 mg Oral Q8H PRN Jimmy Footman, MD   4 mg at 11/17/15 0809  . prenatal vitamin w/FE, FA (PRENATAL 1 +  1) 27-1 MG tablet 1 tablet  1 tablet Oral Daily Audery Amel, MD   1 tablet at 11/20/15 [id number]  . promethazine (PHENERGAN) tablet 25 mg  25 mg Oral Daily PRN Jimmy Footman, MD   25 mg at 11/19/15 2123  . QUEtiapine (SEROQUEL) tablet 25 mg  25 mg Oral QHS Darliss Ridgel, MD   25 mg at 11/19/15 2123  . sertraline (ZOLOFT) tablet 50 mg  50 mg Oral Daily Jimmy Footman, MD   50 mg at 11/20/15 9604    PTA Medications: Prescriptions Prior to Admission  Medication Sig Dispense Refill Last Dose  . albuterol (PROVENTIL HFA;VENTOLIN HFA) 108 (90 BASE) MCG/ACT inhaler Inhale 2 puffs into the lungs every 6 (six) hours as needed for wheezing or shortness of breath. 1 Inhaler 2 08/28/2015 at 1800  . azithromycin (ZITHROMAX Z-PAK) 250 MG tablet Take 2 tablets (500 mg) on  Day 1,  followed by 1 tablet (250 mg) once daily on Days 2 through 5. 6 each 0   . beclomethasone (QVAR) 40 MCG/ACT inhaler Inhale 1 puff into the lungs 2 (two) times daily.   08/29/2015 at 0730  . clindamycin (CLEOCIN) 150 MG capsule Take 3 capsules (450 mg total) by mouth 3 (three) times daily. 90 capsule 0   . cyclobenzaprine (FLEXERIL) 10 MG tablet Take 1 tablet (10 mg total) by mouth 3 (three) times daily as needed for muscle spasms. 30 tablet 0 PRN at PRN  . docusate sodium (COLACE) 100 MG capsule Take 1 capsule (100 mg total) by mouth 2 (two) times daily. (Patient not taking: Reported on 08/31/2015) 60 capsule 2 Not Taking at Unknown time   . Doxylamine-Pyridoxine 10-10 MG TBEC Take 2 tablets by mouth 2 (two) times daily. 60 tablet 1   . etonogestrel-ethinyl estradiol (NUVARING) 0.12-0.015 MG/24HR vaginal ring Insert vaginally and leave in place for 3 consecutive weeks, then remove for 1 week. (Patient not taking: Reported on 08/31/2015) 1 each 12 Not Taking at Unknown time  . ferrous sulfate 325 (65 FE) MG tablet Take 325 mg by mouth daily with breakfast.     . ibuprofen (ADVIL,MOTRIN) 800 MG tablet Take 1 tablet (800 mg total) by mouth 3 (three) times daily. (Patient not taking: Reported on 08/31/2015) 30 tablet 1 Not Taking at Unknown time  . metoCLOPramide (REGLAN) 10 MG tablet Take 1 tablet (10 mg total) by mouth every 8 (eight) hours as needed for nausea. 30 tablet 1   . metoCLOPramide (REGLAN) 10 MG tablet Take 1 tablet (10 mg total) by mouth every 8 (eight) hours as needed for nausea or vomiting. 30 tablet 1   . nitrofurantoin, macrocrystal-monohydrate, (MACROBID) 100 MG capsule Take 1 capsule (100 mg total) by mouth 2 (two) times daily. X 7 days 14 capsule 0   . predniSONE (DELTASONE) 10 MG tablet Take 5 tablets (50 mg total) by mouth daily. 20 tablet 0   . promethazine (PHENERGAN) 25 MG suppository Place 1 suppository (25 mg total) rectally every 6 (six) hours as needed for nausea or vomiting. 12 each 0   . promethazine (PHENERGAN) 25 MG tablet Take 1 tablet (25 mg total) by mouth every 6 (six) hours as needed for nausea or vomiting. 30 tablet 0     Treatment Modalities: Medication Management, Group therapy, Case management,  1 to 1 session with clinician, Psychoeducation, Recreational therapy.   Physician Treatment Plan for Primary Diagnosis: Severe recurrent major depression without psychotic features (HCC) Long Term Goal(s): Improvement in symptoms so  as ready for discharge  Short Term Goals: Ability to verbalize feelings will improve, Ability to disclose and discuss suicidal ideas and Ability to identify and develop  effective coping behaviors will improve  Medication Management: Evaluate patient's response, side effects, and tolerance of medication regimen.  Therapeutic Interventions: 1 to 1 sessions, Unit Group sessions and Medication administration.  Evaluation of Outcomes: Adequate for discharge   Physician Treatment Plan for Secondary Diagnosis: Principal Problem:   Severe recurrent major depression without psychotic features (HCC) Active Problems:   Pregnancy (I trimester)   Cannabis use disorder, severe, dependence (HCC)   Tobacco use disorder   Asthma   Borderline personality disorder   PTSD (post-traumatic stress disorder)   Long Term Goal(s): Improvement in symptoms so as ready for discharge  Short Term Goals: Ability to identify changes in lifestyle to reduce recurrence of condition will improve and Ability to identify triggers associated with substance abuse/mental health issues will improve  Medication Management: Evaluate patient's response, side effects, and tolerance of medication regimen.  Therapeutic Interventions: 1 to 1 sessions, Unit Group sessions and Medication administration.  Evaluation of Outcomes: Adequate for discharge   RN Treatment Plan for Primary Diagnosis: Severe recurrent major depression without psychotic features (HCC) Long Term Goal(s): Knowledge of disease and therapeutic regimen to maintain health will improve  Short Term Goals: Ability to participate in decision making will improve and Ability to verbalize feelings will improve  Medication Management: RN will administer medications as ordered by provider, will assess and evaluate patient's response and provide education to patient for prescribed medication. RN will report any adverse and/or side effects to prescribing provider.  Therapeutic Interventions: 1 on 1 counseling sessions, Psychoeducation, Medication administration, Evaluate responses to treatment, Monitor vital signs and CBGs as ordered,  Perform/monitor CIWA, COWS, AIMS and Fall Risk screenings as ordered, Perform wound care treatments as ordered.  Evaluation of Outcomes: Adequate for discharge    LCSW Treatment Plan for Primary Diagnosis: Severe recurrent major depression without psychotic features (HCC) Long Term Goal(s): Safe transition to appropriate next level of care at discharge, Engage patient in therapeutic group addressing interpersonal concerns.  Short Term Goals: Engage patient in aftercare planning with referrals and resources, Increase ability to appropriately verbalize feelings, Identify triggers associated with mental health/substance abuse issues and Increase skills for wellness and recovery  Therapeutic Interventions: Assess for all discharge needs, 1 to 1 time with Social worker, Explore available resources and support systems, Assess for adequacy in community support network, Educate family and significant other(s) on suicide prevention, Complete Psychosocial Assessment, Interpersonal group therapy.  Evaluation of Outcomes: Adequate for discharge   Progress in Treatment: Attending groups: Yes Participating in groups: Yes Taking medication as prescribed: Yes, MD continues to assess for medication changes as needed Toleration medication: Yes, no side effects reported at this time Family/Significant other contact made:  Patient understands diagnosis:  Discussing patient identified problems/goals with staff: Yes Medical problems stabilized or resolved: Yes Denies suicidal/homicidal ideation:  Issues/concerns per patient self-inventory: None Other: N/A  New problem(s) identified: None identified at this time.   New Short Term/Long Term Goal(s): None identified at this time.   Discharge Plan or Barriers: Pt will discharge home to [facility] and will follow up with [facility] for for medication management, substance abuse treatment and therapy   Reason for Continuation of Hospitalization:  Adequate for  discharge per MD  Estimated Date of discharge: 11/20/15   Attendees: Patient: Kristy Reid 11/20/2015  10:49 AM  Physician: Dr. Ardyth Harps, MD  11/20/2015  10:49 AM  Nursing: Elenore Paddy, RN 11/20/2015  10:49 AM  RN Care Manager: 11/20/2015  10:49 AM  Social Worker: Dorothe Pea. Durward Parcel  11/20/2015  10:49 AM  Recreational Therapist: Hershal Coria, LRT 11/20/2015  10:49 AM  Other: Alveria Apley, PA student 11/20/2015  10:49 AM  Other:  11/20/2015  10:49 AM  Other: 11/20/2015  10:49 AM    Scribe for Treatment Team:  Dorothe Pea. Venita Seng MSW, LCSWA, LCAS

## 2015-11-20 NOTE — Progress Notes (Signed)
Recreation Therapy Notes  Date: 09.06.17 Time: 1:00 pm Location: Craft Room  Group Topic: Self-esteem  Goal Area(s) Addresses:  Patient will write at least one positive trait about self. Patient will verbalize benefit of having a healthy self-esteem.  Behavioral Response: Attentive, Interactive, Intermittently Disruptive  Intervention: I Am  Activity: Patients were given worksheets with the letter I on it and instructed to write as many positive traits inside the letter.  Education: LRT educated patients on ways they can increase their self-esteem.  Education Outcome: Acknowledges education/In group clarification offered  Clinical Observations/Feedback: Patient completed activity by writing positive traits. Patient contributed to group discussion by stating that it was easy for her to think of positive traits, what can make it difficult to think of positive traits, how her self-esteem affects her, and how she can increase her self-esteem.  Jacquelynn CreeGreene,Ashad Fawbush M, LRT/CTRS 11/20/2015 3:07 PM
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
## 2015-11-20 NOTE — [facility] Group Notes (Signed)
[facility] LCSW Group Therapy   11/20/2015  9:30 am   Type of Therapy: Group Therapy   Participation Level: Active   Participation Quality: Attentive, Sharing and Supportive   Affect: Appropriate  Cognitive: Alert and Oriented   Insight: Developing/Improving and Engaged   Engagement in Therapy: Developing/Improving and Engaged   Modes of Intervention: Clarification, Confrontation, Discussion, Education, Exploration, Limit-setting, Orientation, Problem-solving, Rapport Building, Dance movement psychotherapisteality Testing, Socialization and Support   Summary of Progress/Problems: The topic for group today was emotional regulation. This group focused on both positive and negative emotion identification and allowed  group members to process ways to identify feelings, regulate negative emotions, and find healthy ways to manage internal/external emotions. Group members were asked to reflect on a time when their reaction to an emotion led to a negative outcome and explored how alternative responses using emotion regulation would have benefited them. Group members were also asked to discuss a time when emotion regulation was utilized when a negative emotion was experienced. Patient identified two emotions that she is currently feeling as tired and confused. She stated that her medications has caused her tiredness and believes that healthy coping mechanisms such as walking and eating healthy could regulate those emotions.    Hampton AbbotKadijah Neyda Durango, MSW, Theresia MajorsLCSWA
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
## 2015-11-20 NOTE — Progress Notes (Signed)
C/o dizziness to the nurse. Explains that she is "anxious with everything that's going on." Smiling, laughing, euphoric, hyperactive with peers around. Noted walking in hall with peers without difficulty or acute distress. Will continue to monitor.

## 2015-11-20 NOTE — Progress Notes (Signed)
Reviewed discharge paperwork and prescriptions with pt. Verified understanding by use of teach back method. Verbalized understanding. Pt to discharge to boyfriend's sister's home. Boyfriend's sister to arrive by 1600 to transport home per pt. Belongings returned to include clothing and a bandana.  Will continue to monitor and perform every 15 minute safety checks.

## 2015-11-27 ENCOUNTER — Emergency Department
Admission: EM | Admit: 2015-11-27 | Discharge: 2015-11-27 | Disposition: A | Discharge status: Home / Self Care | Payer: Medicaid Other | Attending: Emergency Medicine | Admitting: Emergency Medicine

## 2015-11-27 DIAGNOSIS — O200 Threatened abortion: Secondary | ICD-10-CM | POA: Insufficient documentation

## 2015-11-27 DIAGNOSIS — J45909 Unspecified asthma, uncomplicated: Secondary | ICD-10-CM | POA: Diagnosis not present

## 2015-11-27 DIAGNOSIS — Z3A16 16 weeks gestation of pregnancy: Secondary | ICD-10-CM | POA: Diagnosis not present

## 2015-11-27 DIAGNOSIS — F17200 Nicotine dependence, unspecified, uncomplicated: Secondary | ICD-10-CM | POA: Insufficient documentation

## 2015-11-27 DIAGNOSIS — O26899 Other specified pregnancy related conditions, unspecified trimester: Secondary | ICD-10-CM

## 2015-11-27 DIAGNOSIS — O99332 Smoking (tobacco) complicating pregnancy, second trimester: Secondary | ICD-10-CM | POA: Diagnosis not present

## 2015-11-27 DIAGNOSIS — Z79899 Other long term (current) drug therapy: Secondary | ICD-10-CM | POA: Insufficient documentation

## 2015-11-27 DIAGNOSIS — R109 Unspecified abdominal pain: Secondary | ICD-10-CM

## 2015-11-27 DIAGNOSIS — O209 Hemorrhage in early pregnancy, unspecified: Secondary | ICD-10-CM | POA: Diagnosis present

## 2015-11-27 LAB — COMPREHENSIVE METABOLIC PANEL
ALT: 10 U/L — ABNORMAL LOW (ref 14–54)
ANION GAP: 9 (ref 5–15)
AST: 20 U/L (ref 15–41)
Albumin: 3.5 g/dL (ref 3.5–5.0)
Alkaline Phosphatase: 64 U/L (ref 38–126)
BUN: 8 mg/dL (ref 6–20)
CHLORIDE: 108 mmol/L (ref 101–111)
CO2: 18 mmol/L — AB (ref 22–32)
Calcium: 9 mg/dL (ref 8.9–10.3)
Creatinine, Ser: 0.71 mg/dL (ref 0.44–1.00)
GFR calc Af Amer: >60 mL/min (ref >60)
GFR calc non Af Amer: >60 mL/min (ref >60)
Glucose, Bld: 86 mg/dL (ref 65–99)
POTASSIUM: 3.7 mmol/L (ref 3.5–5.1)
SODIUM: 135 mmol/L (ref 135–145)
Total Bilirubin: <0.1 mg/dL — ABNORMAL LOW (ref 0.3–1.2)
Total Protein: 6.9 g/dL (ref 6.5–8.1)

## 2015-11-27 LAB — URINALYSIS COMPLETE WITH MICROSCOPIC (ARMC ONLY)
Bilirubin Urine: NEGATIVE
Glucose, UA: NEGATIVE mg/dL
HGB URINE DIPSTICK: NEGATIVE
Nitrite: NEGATIVE
PH: 6 (ref 5.0–8.0)
Protein, ur: 30 mg/dL — AB
SPECIFIC GRAVITY, URINE: 1.033 — AB (ref 1.005–1.030)

## 2015-11-27 LAB — CBC
HCT: 32.8 % — ABNORMAL LOW (ref 35.0–47.0)
HEMOGLOBIN: 11.2 g/dL — AB (ref 12.0–16.0)
MCH: 28.2 pg (ref 26.0–34.0)
MCHC: 34.2 g/dL (ref 32.0–36.0)
MCV: 82.4 fL (ref 80.0–100.0)
PLATELETS: 237 10*3/uL (ref 150–440)
RBC: 3.98 MIL/uL (ref 3.80–5.20)
RDW: 19.9 % — AB (ref 11.5–14.5)
WBC: 7.8 10*3/uL (ref 3.6–11.0)

## 2015-11-27 LAB — HCG, QUANTITATIVE, PREGNANCY: HCG, BETA CHAIN, QUANT, S: 62271 m[IU]/mL — AB (ref <5)

## 2015-11-27 MED ORDER — ACETAMINOPHEN 325 MG PO TABS
650.0000 mg | ORAL_TABLET | Freq: Once | ORAL | Status: AC
  Administered 2015-11-27: 650 mg via ORAL

## 2015-11-27 MED ORDER — ACETAMINOPHEN 325 MG PO TABS
ORAL_TABLET | ORAL | Status: AC
  Administered 2015-11-27: 650 mg via ORAL
  Filled 2015-11-27: qty 2

## 2015-11-27 NOTE — ED Provider Notes (Addendum)
[facility] Emergency Department Provider Note   ____________________________________________   First MD Initiated Contact with Patient 11/27/15 1512     (approximate)  I have reviewed the triage vital signs and the nursing notes.   HISTORY  Chief Complaint Vaginal Bleeding   HPI Kristy Reid is a 20 y.o. female at [redacted] weeks pregnant who is presenting to the emergency department today with lower abdominal cramping as well as passing a blood clot which was about [employer] eyes this morning. Says that the blood is only noticeable now when she wiped after urinating. Denies any burning or itching to the vaginal area. Says that she was diagnosed with trichomonas several weeks ago and treated in the discharge has now completely resolved. Says that she is taking prenatal vitamins as well as several psychiatric medications which she is in the process of weaning herself from. Says that she also has lower abdominal cramping which is mild to moderate and she has not taken any pain control medication for this thus far.   Past Medical History:  Diagnosis Date  . Anemia   . Anemia   . Anxiety   . Asthma   . Chronic back pain   . Depression   . Insomnia   . PTSD (post-traumatic stress disorder)     Patient Active Problem List   Diagnosis Date Noted  . Pregnancy (I trimester) 11/15/2015  . Cannabis use disorder, severe, dependence (HCC) 11/15/2015  . Tobacco use disorder 11/15/2015  . Asthma 11/15/2015  . Borderline personality disorder 11/15/2015  . PTSD (post-traumatic stress disorder) 11/15/2015  . Severe recurrent major depression without psychotic features (HCC) 11/13/2015    Past Surgical History:  Procedure Laterality Date  . BUNIONECTOMY Bilateral    feet  . wrist sugery      Prior to Admission medications   Medication Sig Start Date End Date Taking? Authorizing Provider  albuterol (PROVENTIL HFA;VENTOLIN HFA) 108 (90 BASE) MCG/ACT  inhaler Inhale 2 puffs into the lungs every 6 (six) hours as needed for wheezing or shortness of breath. 01/06/15   Sharyn Creamer, MD  clindamycin (CLEOCIN) 150 MG capsule Take 3 capsules (450 mg total) by mouth every 8 (eight) hours. 11/19/15   Jimmy Footman, MD  docusate sodium (COLACE) 100 MG capsule Take 1 capsule (100 mg total) by mouth 2 (two) times daily. Patient not taking: Reported on 08/31/2015 02/15/15 02/15/16  Minna Antis, MD  prenatal vitamin w/FE, FA (PRENATAL 1 + 1) 27-1 MG TABS tablet Take 1 tablet by mouth daily. 11/19/15   Jimmy Footman, MD  promethazine (PHENERGAN) 25 MG tablet Take 1 tablet (25 mg total) by mouth every 6 (six) hours as needed for nausea or vomiting. 11/13/15   Joni Reining, PA-C  QUEtiapine (SEROQUEL) 25 MG tablet Take 1 tablet (25 mg total) by mouth at bedtime. 11/19/15   Jimmy Footman, MD  sertraline (ZOLOFT) 50 MG tablet Take 1 tablet (50 mg total) by mouth daily. 11/19/15   Jimmy Footman, MD    Allergies Penicillins and Rocephin [ceftriaxone]  Family History  Problem Relation Age of Onset  . Diabetes Mother   . Diabetes Maternal Grandmother   . Heart disease Maternal Grandmother   . Cancer Neg Hx   . Ovarian cancer Neg Hx     Social History Social History  Substance Use Topics  . Smoking status: Current Every Day Smoker    Last attempt to quit: 08/13/2015  . Smokeless tobacco: Never Used  .  Alcohol use No    Review of Systems Constitutional: No fever/chills Eyes: No visual changes. ENT: No sore throat. Cardiovascular: Denies chest pain. Respiratory: Denies shortness of breath. Gastrointestinal: No nausea, no vomiting.  No diarrhea.  No constipation. Genitourinary: Negative for dysuria. Musculoskeletal: Negative for back pain. Skin: Negative for rash. Neurological: Negative for headaches, focal weakness or numbness.  10-point ROS otherwise  negative.  ____________________________________________   PHYSICAL EXAM:  VITAL SIGNS: ED Triage Vitals  Enc Vitals Group     BP --      Pulse --      Resp --      Temp --      Temp src --      SpO2 --      Weight 11/27/15 1315 165 lb (74.8 kg)     Height 11/27/15 1315 5\' 2"  (1.575 m)     Head Circumference --      Peak Flow --      Pain Score 11/27/15 1316 6     Pain Loc --      Pain Edu? --      Excl. in GC? --     Constitutional: Alert and oriented. Well appearing and in no acute distress. Eyes: Conjunctivae are normal. PERRL. EOMI. Head: Atraumatic. Nose: No congestion/rhinnorhea. Mouth/Throat: Mucous membranes are moist.   Neck: No stridor.   Cardiovascular: Normal rate, regular rhythm. Grossly normal heart sounds.   Respiratory: Normal respiratory effort.  No retractions. Lungs CTAB. Gastrointestinal: Soft With mild periumbilical tenderness to palpation. Gravid with uterine fundus 3 cm below the umbilicus. Genitourinary:  Normal external appearance of the genitalia. Bimanual exam with a closed cervical os. No CMT. Mild tenderness to the uterine as well as the bilateral adnexa without any masses. No blood on the glove. Musculoskeletal: No lower extremity tenderness nor edema.  No joint effusions.  Neurologic:  Normal speech and language. No gross focal neurologic deficits are appreciated. Skin:  Skin is warm, dry and intact. No rash noted. Psychiatric: Mood and affect are normal. Speech and behavior are normal.  ____________________________________________   LABS (all labs ordered are listed, but only abnormal results are displayed)  Labs Reviewed  HCG, QUANTITATIVE, PREGNANCY - Abnormal; Notable for the following:       Result Value   hCG, Beta Chain, Quant, S 62,271 (*)    All other components within normal limits  CBC - Abnormal; Notable for the following:    Hemoglobin 11.2 (*)    HCT 32.8 (*)    RDW 19.9 (*)    All other components within normal  limits  COMPREHENSIVE METABOLIC PANEL - Abnormal; Notable for the following:    CO2 18 (*)    ALT 10 (*)    Total Bilirubin <0.1 (*)    All other components within normal limits  URINALYSIS COMPLETEWITH MICROSCOPIC ([facility] ONLY) - Abnormal; Notable for the following:    Color, Urine YELLOW (*)    APPearance CLOUDY (*)    Ketones, ur TRACE (*)    Specific Gravity, Urine 1.033 (*)    Protein, ur 30 (*)    Leukocytes, UA 3+ (*)    Bacteria, UA RARE (*)    Squamous Epithelial / LPF TOO NUMEROUS TO COUNT (*)    All other components within normal limits  URINE CULTURE   ____________________________________________  EKG   ____________________________________________  RADIOLOGY   ____________________________________________   PROCEDURES  Procedure(s) performed:   Procedures  Critical Care performed:   ____________________________________________  INITIAL IMPRESSION / ASSESSMENT AND PLAN / ED COURSE  Pertinent labs & imaging results that were available during my care of the patient were reviewed by me and considered in my medical decision making (see chart for details).  ----------------------------------------- 5:46 PM on 11/27/2015 -----------------------------------------  Patient with reassuring exam. Known intrauterine pregnancy. O+ blood type. No further bleeding on exam. Closed cervical os. Will be discharged home and [facility]. Continue to take prenatal vitamins. I discussed pelvic rest including no intercourse or anything else in the vagina. She also knows not to do any heavy lifting. Will be discharged home. Gen. since plan and is willing to comply.  Clinical Course     ____________________________________________   FINAL CLINICAL IMPRESSION(S) / ED DIAGNOSES  Abdominal pain and pregnancy. Threatened abortion.    NEW MEDICATIONS STARTED DURING THIS VISIT:  New Prescriptions   No medications on file     Note:  This document  was prepared using Dragon voice recognition software and may include unintentional dictation errors.    Myrna Blazeravid Matthew Schaevitz, MD 11/27/15 1746  Patient also with positive UA but highly contaminated. Patient denies any dysuria. Denies any frequency. Says that she has been on multiple courses of antibiotics. I will not be prescribing her an additional course of antibiotics as I feel at this point that the benefit does not outweigh the risk. I discussed this plan with the patient understands and is willing to comply. A urine culture will be sent.    Myrna Blazeravid Matthew Schaevitz, MD 11/27/15 [phone]

## 2015-11-27 NOTE — ED Triage Notes (Signed)
Pt states she is [redacted] weeks pregnant and having vaginal bleeding with abd cramping that started today..Marland Kitchen

## 2015-11-29 ENCOUNTER — Emergency Department
Admission: EM | Admit: 2015-11-29 | Discharge: 2015-11-29 | Disposition: A | Discharge status: Home / Self Care | Payer: Medicaid Other | Attending: Emergency Medicine | Admitting: Emergency Medicine

## 2015-11-29 ENCOUNTER — Encounter: Payer: Self-pay | Admitting: Emergency Medicine

## 2015-11-29 DIAGNOSIS — J45909 Unspecified asthma, uncomplicated: Secondary | ICD-10-CM | POA: Diagnosis not present

## 2015-11-29 DIAGNOSIS — O0281 Inappropriate change in quantitative human chorionic gonadotropin (hCG) in early pregnancy: Secondary | ICD-10-CM | POA: Insufficient documentation

## 2015-11-29 DIAGNOSIS — F329 Major depressive disorder, single episode, unspecified: Secondary | ICD-10-CM | POA: Diagnosis not present

## 2015-11-29 DIAGNOSIS — O9A211 Injury, poisoning and certain other consequences of external causes complicating pregnancy, first trimester: Secondary | ICD-10-CM | POA: Insufficient documentation

## 2015-11-29 DIAGNOSIS — T1491XA Suicide attempt, initial encounter: Secondary | ICD-10-CM

## 2015-11-29 DIAGNOSIS — Z3A13 13 weeks gestation of pregnancy: Secondary | ICD-10-CM | POA: Insufficient documentation

## 2015-11-29 DIAGNOSIS — F332 Major depressive disorder, recurrent severe without psychotic features: Secondary | ICD-10-CM

## 2015-11-29 DIAGNOSIS — O99341 Other mental disorders complicating pregnancy, first trimester: Secondary | ICD-10-CM | POA: Insufficient documentation

## 2015-11-29 DIAGNOSIS — F603 Borderline personality disorder: Secondary | ICD-10-CM | POA: Diagnosis present

## 2015-11-29 DIAGNOSIS — Z79899 Other long term (current) drug therapy: Secondary | ICD-10-CM | POA: Diagnosis not present

## 2015-11-29 DIAGNOSIS — F17200 Nicotine dependence, unspecified, uncomplicated: Secondary | ICD-10-CM | POA: Insufficient documentation

## 2015-11-29 DIAGNOSIS — T50901A Poisoning by unspecified drugs, medicaments and biological substances, accidental (unintentional), initial encounter: Secondary | ICD-10-CM

## 2015-11-29 DIAGNOSIS — T433X2A Poisoning by phenothiazine antipsychotics and neuroleptics, intentional self-harm, initial encounter: Secondary | ICD-10-CM | POA: Insufficient documentation

## 2015-11-29 DIAGNOSIS — T50902A Poisoning by unspecified drugs, medicaments and biological substances, intentional self-harm, initial encounter: Secondary | ICD-10-CM

## 2015-11-29 DIAGNOSIS — O99331 Smoking (tobacco) complicating pregnancy, first trimester: Secondary | ICD-10-CM | POA: Diagnosis not present

## 2015-11-29 DIAGNOSIS — O0990 Supervision of high risk pregnancy, unspecified, unspecified trimester: Secondary | ICD-10-CM

## 2015-11-29 LAB — URINALYSIS COMPLETE WITH MICROSCOPIC (ARMC ONLY)
BILIRUBIN URINE: NEGATIVE
GLUCOSE, UA: NEGATIVE mg/dL
Hgb urine dipstick: NEGATIVE
KETONES UR: NEGATIVE mg/dL
NITRITE: NEGATIVE
Protein, ur: NEGATIVE mg/dL
SPECIFIC GRAVITY, URINE: 1.003 — AB (ref 1.005–1.030)
pH: 6 (ref 5.0–8.0)

## 2015-11-29 LAB — COMPREHENSIVE METABOLIC PANEL
ALK PHOS: 57 U/L (ref 38–126)
ALT: 12 U/L — AB (ref 14–54)
AST: 29 U/L (ref 15–41)
Albumin: 3.1 g/dL — ABNORMAL LOW (ref 3.5–5.0)
Anion gap: 8 (ref 5–15)
BUN: 7 mg/dL (ref 6–20)
CHLORIDE: 110 mmol/L (ref 101–111)
CO2: 18 mmol/L — AB (ref 22–32)
CREATININE: 0.64 mg/dL (ref 0.44–1.00)
Calcium: 8.8 mg/dL — ABNORMAL LOW (ref 8.9–10.3)
GFR calc Af Amer: >60 mL/min (ref >60)
Glucose, Bld: 85 mg/dL (ref 65–99)
Potassium: 4.2 mmol/L (ref 3.5–5.1)
Sodium: 136 mmol/L (ref 135–145)
Total Bilirubin: 0.5 mg/dL (ref 0.3–1.2)
Total Protein: 6.4 g/dL — ABNORMAL LOW (ref 6.5–8.1)

## 2015-11-29 LAB — HCG, QUANTITATIVE, PREGNANCY: hCG, Beta Chain, Quant, S: 47115 m[IU]/mL — ABNORMAL HIGH (ref <5)

## 2015-11-29 LAB — SALICYLATE LEVEL

## 2015-11-29 LAB — CBC
HEMATOCRIT: 31.8 % — AB (ref 35.0–47.0)
Hemoglobin: 10.6 g/dL — ABNORMAL LOW (ref 12.0–16.0)
MCH: 27.9 pg (ref 26.0–34.0)
MCHC: 33.4 g/dL (ref 32.0–36.0)
MCV: 83.6 fL (ref 80.0–100.0)
PLATELETS: 226 10*3/uL (ref 150–440)
RBC: 3.81 MIL/uL (ref 3.80–5.20)
RDW: 19.8 % — AB (ref 11.5–14.5)
WBC: 7.4 10*3/uL (ref 3.6–11.0)

## 2015-11-29 LAB — URINE DRUG SCREEN, QUALITATIVE (ARMC ONLY)
AMPHETAMINES, UR SCREEN: NOT DETECTED
Barbiturates, Ur Screen: NOT DETECTED
Benzodiazepine, Ur Scrn: NOT DETECTED
CANNABINOID 50 NG, UR ~~LOC~~: NOT DETECTED
COCAINE METABOLITE, UR ~~LOC~~: NOT DETECTED
MDMA (ECSTASY) UR SCREEN: NOT DETECTED
Methadone Scn, Ur: NOT DETECTED
Opiate, Ur Screen: NOT DETECTED
PHENCYCLIDINE (PCP) UR S: NOT DETECTED
Tricyclic, Ur Screen: NOT DETECTED

## 2015-11-29 LAB — ACETAMINOPHEN LEVEL
Acetaminophen (Tylenol), Serum: <10 ug/mL — ABNORMAL LOW (ref 10–30)
Acetaminophen (Tylenol), Serum: <10 ug/mL — ABNORMAL LOW (ref 10–30)

## 2015-11-29 LAB — ETHANOL: Alcohol, Ethyl (B): <5 mg/dL (ref <5)

## 2015-11-29 MED ORDER — SODIUM CHLORIDE 0.9 % IV BOLUS (SEPSIS)
1000.0000 mL | Freq: Once | INTRAVENOUS | Status: AC
  Administered 2015-11-29: 1000 mL via INTRAVENOUS

## 2015-11-29 NOTE — ED Notes (Signed)
Pt discharged to home.  Family member driving.  Discharge instructions reviewed.  Verbalized understanding.  No questions or concerns at this time.  Teach back verified.  Pt in NAD.  No items left in ED.   

## 2015-11-29 NOTE — Discharge Instructions (Signed)
You have been seen in the Emergency Department (ED)  today for a psychiatric complaint.  You have been evaluated by psychiatry and we believe you are safe to be discharged from the hospital.   ° °Please return to the Emergency Department (ED)  immediately if you have ANY thoughts of hurting yourself or anyone else, so that we may help you. ° °Please avoid alcohol and drug use. ° °Follow up with your doctor and/or therapist as soon as possible regarding today's ED  visit.  ° °You may call crisis hotline for [place] at [phone]. ° °

## 2015-11-29 NOTE — Consult Note (Signed)
[facility] Psychiat[facility] Consult   Reason for Consult:  Consult for 20 year old woman with a history of chronic mood disorder who came in to the hospital after ingesting a reported 10 of the 12.5 mg Phenergan tablets Referring Physician:  Virl Cagey Patient Identification: Kristy Reid MRN:  [id number] Principal Diagnosis: Overdose Diagnosis:   Patient Active Problem List   Diagnosis Date Noted  . Overdose [T50.901A] 11/29/2015  . Pregnancy (I trimester) [Z33.1] 11/15/2015  . Cannabis use disorder, severe, dependence ([place]) [F12.20] 11/15/2015  . Tobacco use disorder [F17.200] 11/15/2015  . Asthma [J45.909] 11/15/2015  . Borderline personality disorder [F60.3] 11/15/2015  . PTSD (post-traumatic stress disorder) [F43.10] 11/15/2015  . Severe recurrent major depression without psychotic features ([place]) [F33.2] 11/13/2015    Total Time spent with patient: 1 hour  Subjective:   Kristy Reid is a 20 y.o. female patient admitted with "I was just tired".  HPI:  Patient interviewed. Chart reviewed. Labs and vitals reviewed. 20 year old woman with a history of chronic mood disorder and personality disorder who came in the hospital very shortly after ingesting approximately 10 Phenergan tablets. Patient says that her mood had actually been good up until today since her last hospitalization. She had been feeling relatively stable and has not been having any suicidal thoughts. She's had some drama and stress among the people she is staying with her over the last couple days. This afternoon she was feeling upset and overwhelmed. She was sitting next to one of the people she lives with when she swallowed 10 of the Phenergan tablets in front of them. She has been cooperative with coming to the hospital and getting appropriate treatment. Patient denies to me that there was any suicidal ideation or intent to it. She denies that she's having any psychotic symptoms. Denies having any alcohol or drug abuse. Says  that she has been otherwise compliant with outpatient treatment and has been to see a mental health provider at [facility].  Social history: Patient is from [place] state but has been living here in [place] for a while. Seems to be sort of loose ends among several different people. She is currently pregnant and intends to carry the pregnancy through. Not working currently.  Medical history: Currently pregnant mild asthma.  Substance abuse history: Denies any active ongoing alcohol or drug abuse  Past Psychiatric History: Patient has a long history of mood disorder mood instability multiple overdoses cutting suicide attempts and multiple hospitalizations. 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 She was just recently here at our hospital and just discharged about 10 days ago.  Risk to Self: Suicidal Ideation: No Suicidal Intent: No (denies current, but acknowledges o.d.) Is patient at risk for suicide?: Yes Suicidal Plan?: Yes-Currently Present Specify Current Suicidal Plan: O.D. on pills Access to Means: Yes Specify Access to Suicidal Means: access to pills What has been your use of drugs/alcohol within the last 12 months?: pt. denies, per [facility] marijuana How many times?:  (1 acknowledged report) Other Self Harm Risks:  (prior cutting behaviors in past) Triggers for Past Attempts: Unpredictable Intentional Self Injurious Behavior: None (currently denies) Comment - Self Injurious Behavior: past, denies current Risk to Others: Homicidal Ideation: No Thoughts of Harm to Others: No Current Homicidal Intent: No Current Homicidal Plan: No Access to Homicidal Means: No Identified Victim: none History of harm to others?: No Assessment of Violence: None Noted Violent Behavior Description: none noted Does patient have access to weapons?: No Criminal Charges Pending?: No  Does patient have a court date: No Prior Inpatient Therapy: Prior Inpatient Therapy:  Yes Prior Therapy Dates: 2017 September, 2015 Prior Therapy Facilty/Provider(s): [facility], [place] Reason for Treatment: Borderline Personality Disorder Prior Outpatient Therapy: Prior Outpatient Therapy: Yes Prior Therapy Dates: current Prior Therapy Facilty/Provider(s): [facility] Reason for Treatment: borderline personality disorder Does patient have an ACCT team?: No Does patient have Intensive In-House Services?  : No Does patient have [facility] services? : Unknown Does patient have P4CC services?: No  Past Medical History:  Past Medical History:  Diagnosis Date  . Anemia   . Anemia   . Anxiety   . Asthma   . Chronic back pain   . Depression   . Insomnia   . PTSD (post-traumatic stress disorder)     Past Surgical History:  Procedure Laterality Date  . BUNIONECTOMY Bilateral    feet  . wrist sugery     Family History:  Family History  Problem Relation Age of Onset  . Diabetes Mother   . Diabetes Maternal Grandmother   . Heart disease Maternal Grandmother   . Cancer Neg Hx   . Ovarian cancer Neg Hx    Family Psychiatric  History: Positive for mood disorder Social History:  History  Alcohol Use No     History  Drug Use No    Social History   Social History  . Marital status: Single    Spouse name: N/A  . Number of children: N/A  . Years of education: N/A   Social History Main Topics  . Smoking status: Current Every Day Smoker    Last attempt to quit: 08/13/2015  . Smokeless tobacco: Never Used  . Alcohol use No  . Drug use: No  . Sexual activity: Yes    Birth control/ protection: Injection   Other Topics Concern  . None   Social History Narrative  . None   Additional Social History:    Allergies:   Allergies  Allergen Reactions  . Penicillins Hives    Has patient had a PCN reaction causing immediate rash, facial/tongue/throat swelling, SOB or lightheadedness with hypotension: No Has patient had a PCN reaction causing severe rash involving mucus  membranes or skin necrosis: No Has patient had a PCN reaction that required hospitalization No Has patient had a PCN reaction occurring within the last 10 years: No If all of the above answers are "NO", then may proceed with Cephalosporin use.  . Rocephin [Ceftriaxone] Hives    Labs:  Results for orders placed or performed during the hospital encounter of 11/29/15 (from the past 48 hour(s))  hCG, quantitative, pregnancy     Status: Abnormal   Collection Time: 11/29/15  3:43 PM  Result Value Ref Range   hCG, Beta Chain, Quant, S 47,115 (H) <5 mIU/mL    Comment:          GEST. AGE      CONC.  (mIU/mL)   <=1 WEEK        5 - 50     2 WEEKS       50 - 500     3 WEEKS       100 - 10,000     4 WEEKS     1,000 - 30,000     5 WEEKS     3,500 - 115,000   6-8 WEEKS     12,000 - 270,000    12 WEEKS     15,000 - 220,000  FEMALE AND NON-PREGNANT FEMALE:     LESS THAN 5 mIU/mL   Comprehensive metabolic panel     Status: Abnormal   Collection Time: 11/29/15  3:53 PM  Result Value Ref Range   Sodium 136 135 - 145 mmol/L   Potassium 4.2 3.5 - 5.1 mmol/L    Comment: HEMOLYSIS AT THIS LEVEL MAY AFFECT RESULT   Chloride 110 101 - 111 mmol/L   CO2 18 (L) 22 - 32 mmol/L   Glucose, Bld 85 65 - 99 mg/dL   BUN 7 6 - 20 mg/dL   Creatinine, Ser 0.64 0.44 - 1.00 mg/dL   Calcium 8.8 (L) 8.9 - 10.3 mg/dL   Total Protein 6.4 (L) 6.5 - 8.1 g/dL   Albumin 3.1 (L) 3.5 - 5.0 g/dL   AST 29 15 - 41 U/L    Comment: HEMOLYSIS AT THIS LEVEL MAY AFFECT RESULT   ALT 12 (L) 14 - 54 U/L   Alkaline Phosphatase 57 38 - 126 U/L   Total Bilirubin 0.5 0.3 - 1.2 mg/dL    Comment: HEMOLYSIS AT THIS LEVEL MAY AFFECT RESULT   GFR calc non Af Amer >60 >60 mL/min   GFR calc Af Amer >60 >60 mL/min    Comment: (NOTE) The eGFR has been calculated using the CKD EPI equation. This calculation has not been validated in all clinical situations. eGFR's persistently <60 mL/min signify possible Chronic Kidney Disease.     Anion gap 8 5 - 15  Salicylate level     Status: None   Collection Time: 11/29/15  3:53 PM  Result Value Ref Range   Salicylate Lvl <6.9 2.8 - 30.0 mg/dL  Acetaminophen level     Status: Abnormal   Collection Time: 11/29/15  3:53 PM  Result Value Ref Range   Acetaminophen (Tylenol), Serum <10 (L) 10 - 30 ug/mL    Comment:        THERAPEUTIC CONCENTRATIONS VARY SIGNIFICANTLY. A RANGE OF 10-30 ug/mL MAY BE AN EFFECTIVE CONCENTRATION FOR MANY PATIENTS. HOWEVER, SOME ARE BEST TREATED AT CONCENTRATIONS OUTSIDE THIS RANGE. ACETAMINOPHEN CONCENTRATIONS >150 ug/mL AT 4 HOURS AFTER INGESTION AND >50 ug/mL AT 12 HOURS AFTER INGESTION ARE OFTEN ASSOCIATED WITH TOXIC REACTIONS.   Ethanol     Status: None   Collection Time: 11/29/15  3:53 PM  Result Value Ref Range   Alcohol, Ethyl (B) <5 <5 mg/dL    Comment:        LOWEST DETECTABLE LIMIT FOR SERUM ALCOHOL IS 5 mg/dL FOR MEDICAL PURPOSES ONLY   Urinalysis complete, with microscopic ([facility] only)     Status: Abnormal   Collection Time: 11/29/15  5:00 PM  Result Value Ref Range   Color, Urine STRAW (A) YELLOW   APPearance CLEAR (A) CLEAR   Glucose, UA NEGATIVE NEGATIVE mg/dL   Bilirubin Urine NEGATIVE NEGATIVE   Ketones, ur NEGATIVE NEGATIVE mg/dL   Specific Gravity, Urine 1.003 (L) 1.005 - 1.030   Hgb urine dipstick NEGATIVE NEGATIVE   pH 6.0 5.0 - 8.0   Protein, ur NEGATIVE NEGATIVE mg/dL   Nitrite NEGATIVE NEGATIVE   Leukocytes, UA 1+ (A) NEGATIVE   RBC / HPF 0-5 0 - 5 RBC/hpf   WBC, UA 0-5 0 - 5 WBC/hpf   Bacteria, UA RARE (A) NONE SEEN   Squamous Epithelial / LPF 0-5 (A) NONE SEEN    No current facility-administered medications for this encounter.    Current Outpatient Prescriptions  Medication Sig Dispense Refill  . albuterol (PROVENTIL HFA;VENTOLIN HFA)  108 (90 BASE) MCG/ACT inhaler Inhale 2 puffs into the lungs every 6 (six) hours as needed for wheezing or shortness of breath. 1 Inhaler 2  . clindamycin (CLEOCIN) 150  MG capsule Take 3 capsules (450 mg total) by mouth every 8 (eight) hours. 6 capsule 0  . docusate sodium (COLACE) 100 MG capsule Take 1 capsule (100 mg total) by mouth 2 (two) times daily. (Patient not taking: Reported on 08/31/2015) 60 capsule 2  . prenatal vitamin w/FE, FA (PRENATAL 1 + 1) 27-1 MG TABS tablet Take 1 tablet by mouth daily. 30 each 0  . promethazine (PHENERGAN) 25 MG tablet Take 1 tablet (25 mg total) by mouth every 6 (six) hours as needed for nausea or vomiting. 30 tablet 0  . QUEtiapine (SEROQUEL) 25 MG tablet Take 1 tablet (25 mg total) by mouth at bedtime. 30 tablet 0  . sertraline (ZOLOFT) 50 MG tablet Take 1 tablet (50 mg total) by mouth daily. 30 tablet 0    Musculoskeletal: Strength & Muscle Tone: within normal limits Gait & Station: normal Patient leans: N/A  Psychiatric Specialty Exam: Physical Exam  Nursing note and vitals reviewed. Constitutional: She appears well-developed and well-nourished.  HENT:  Head: Normocephalic and atraumatic.  Eyes: Conjunctivae are normal. Pupils are equal, round, and reactive to light.  Neck: Normal range of motion.  Cardiovascular: Regular rhythm and normal heart sounds.   Respiratory: Effort normal. No respiratory distress.  GI: Soft.  Musculoskeletal: Normal range of motion.  Neurological: She is alert.  Skin: Skin is warm and dry.  Psychiatric: Her speech is normal and behavior is normal. Thought content normal. Her affect is blunt. Cognition and memory are normal. She expresses impulsivity.    Review of Systems  Constitutional: Negative.   HENT: Negative.   Eyes: Negative.   Respiratory: Negative.   Cardiovascular: Negative.   Gastrointestinal: Negative.   Musculoskeletal: Negative.   Skin: Negative.   Neurological: Negative.   Psychiatric/Behavioral: Negative for depression, hallucinations, memory loss, substance abuse and suicidal ideas. The patient is not nervous/anxious and does not have insomnia.     Blood  pressure 110/66, pulse (!) 105, temperature 98.1 F (36.7 C), temperature source Oral, resp. rate 18, height _0  (1.575 m), weight 74.8 kg (165 lb), last menstrual period 07/18/2015, SpO2 100 %.Body mass index is 30.18 kg/m.  General Appearance: Casual  Eye Contact:  Fair  Speech:  Normal Rate  Volume:  Decreased  Mood:  Euthymic  Affect:  Constricted  Thought Process:  Goal Directed  Orientation:  Full (Time, Place, and Person)  Thought Content:  Logical  Suicidal Thoughts:  No  Homicidal Thoughts:  No  Memory:  Immediate;   Good Recent;   Fair Remote;   Fair  Judgement:  Fair  Insight:  Fair  Psychomotor Activity:  Decreased  Concentration:  Concentration: Fair  Recall:  [employer] of Knowledge:  Fair  Language:  Fair  Akathisia:  No  Handed:  Right  AIMS (if indicated):     Assets:  Communication Skills Desire for Improvement Housing Physical Health Resilience Social Support  ADL's:  Intact  Cognition:  WNL  Sleep:        Treatment Plan Summary: Plan 20 year old woman with long-standing borderline personality behavior. Her behavior today was to take an overdose of nonlethal amount and type I in front of another person and then cooperate with treatment here. She states very clearly that she does not want to die. Her conversation focuses on  multiple plans for the future. Patient does not appear to be psychotic nor intoxicated. At this point I don't think she is likely to benefit from further inpatient hospitalization. Supportive counseling with the patient and reminder to continue with outpatient therapy. I am going to take her off commitment if one has been filed and review case with emergency room physician but I would recommend that she does not need admission to psychiatry.  Disposition: Patient does not meet criteria for psychiatric inpatient admission. Supportive therapy provided about ongoing stressors.  Alethia Berthold, MD 11/29/2015 5:23 PM

## 2015-11-29 NOTE — ED Notes (Signed)
Dr. Toni Amendclapacs rescinded IVC papers

## 2015-11-29 NOTE — ED Provider Notes (Signed)
[facility] Emergency Department Provider Note  ____________________________________________  Time seen: Approximately 3:58 PM  I have reviewed the triage vital signs and the nursing notes.   HISTORY  Chief Complaint Suicide Attempt   HPI Kristy Reid is a 20 y.o. female with a history of anemia, anxiety, depression, PTSD who presents for evaluation after an intentional overdose. Patient reports that at 2:50 PM she took 10 x 12.5 mg Phenergan. She reports that she is currently [redacted] weeks pregnant and that's the reason why she try to commit suicide. She reports that initially she wanted the baby however now she no longer wants the baby and it is too late for her to pursue an abortion. She also does not want to put the baby for adoption. She has a 25-year-old at home. This is her third pregnancy. She had no prior complications with this pregnancy. She was given Phenergan for nausea. At this time she reports that she feels tired but denies chest pain, palpitations, shortness of breath, abdominal pain, nausea, vomiting. Patient has had multiple prior suicide attempts with overdose and trying to drown herself. She reports that she has established care for this pregnancy.  Past Medical History:  Diagnosis Date  . Anemia   . Anemia   . Anxiety   . Asthma   . Chronic back pain   . Depression   . Insomnia   . PTSD (post-traumatic stress disorder)     Patient Active Problem List   Diagnosis Date Noted  . Overdose 11/29/2015  . Pregnancy (I trimester) 11/15/2015  . Cannabis use disorder, severe, dependence (HCC) 11/15/2015  . Tobacco use disorder 11/15/2015  . Asthma 11/15/2015  . Borderline personality disorder 11/15/2015  . PTSD (post-traumatic stress disorder) 11/15/2015  . Severe recurrent major depression without psychotic features (HCC) 11/13/2015    Past Surgical History:  Procedure Laterality Date  . BUNIONECTOMY Bilateral    feet  . wrist sugery        Prior to Admission medications   Medication Sig Start Date End Date Taking? Authorizing Provider  albuterol (PROVENTIL HFA;VENTOLIN HFA) 108 (90 BASE) MCG/ACT inhaler Inhale 2 puffs into the lungs every 6 (six) hours as needed for wheezing or shortness of breath. 01/06/15   Sharyn Creamer, MD  clindamycin (CLEOCIN) 150 MG capsule Take 3 capsules (450 mg total) by mouth every 8 (eight) hours. 11/19/15   Jimmy Footman, MD  docusate sodium (COLACE) 100 MG capsule Take 1 capsule (100 mg total) by mouth 2 (two) times daily. Patient not taking: Reported on 08/31/2015 02/15/15 02/15/16  Minna Antis, MD  prenatal vitamin w/FE, FA (PRENATAL 1 + 1) 27-1 MG TABS tablet Take 1 tablet by mouth daily. 11/19/15   Jimmy Footman, MD  promethazine (PHENERGAN) 25 MG tablet Take 1 tablet (25 mg total) by mouth every 6 (six) hours as needed for nausea or vomiting. 11/13/15   Joni Reining, PA-C  QUEtiapine (SEROQUEL) 25 MG tablet Take 1 tablet (25 mg total) by mouth at bedtime. 11/19/15   Jimmy Footman, MD  sertraline (ZOLOFT) 50 MG tablet Take 1 tablet (50 mg total) by mouth daily. 11/19/15   Jimmy Footman, MD    Allergies Penicillins and Rocephin [ceftriaxone]  Family History  Problem Relation Age of Onset  . Diabetes Mother   . Diabetes Maternal Grandmother   . Heart disease Maternal Grandmother   . Cancer Neg Hx   . Ovarian cancer Neg Hx     Social History Social History  Substance Use Topics  . Smoking status: Current Every Day Smoker    Last attempt to quit: 08/13/2015  . Smokeless tobacco: Never Used  . Alcohol use No    Review of Systems  Constitutional: Negative for fever. Eyes: Negative for visual changes. ENT: Negative for sore throat. Cardiovascular: Negative for chest pain. Respiratory: Negative for shortness of breath. Gastrointestinal: Negative for abdominal pain, vomiting or diarrhea. Genitourinary: Negative for  dysuria. Musculoskeletal: Negative for back pain. Skin: Negative for rash. Neurological: Negative for headaches, weakness or numbness. Psych: + depression and SI  ____________________________________________   PHYSICAL EXAM:  VITAL SIGNS: ED Triage Vitals  Enc Vitals Group     BP 11/29/15 1547 110/66     Pulse Rate 11/29/15 1547 (!) 105     Resp 11/29/15 1547 18     Temp 11/29/15 1547 98.1 F (36.7 C)     Temp Source 11/29/15 1547 Oral     SpO2 11/29/15 1540 99 %     Weight 11/29/15 1547 165 lb (74.8 kg)     Height 11/29/15 1547 5\' 2"  (1.575 m)     Head Circumference --      Peak Flow --      Pain Score 11/29/15 1548 8     Pain Loc --      Pain Edu? --      Excl. in GC? --     Constitutional: Alert and oriented. Well appearing and in no apparent distress. HEENT:      Head: Normocephalic and atraumatic.         Eyes: Conjunctivae are normal. Sclera is non-icteric. EOMI. PERRL      Mouth/Throat: Mucous membranes are moist.       Neck: Supple with no signs of meningismus. Cardiovascular: Regular rate and rhythm. No murmurs, gallops, or rubs. 2+ symmetrical distal pulses are present in all extremities. No JVD. Respiratory: Normal respiratory effort. Lungs are clear to auscultation bilaterally. No wheezes, crackles, or rhonchi.  Gastrointestinal: Soft, non tender, and non distended with positive bowel sounds. No rebound or guarding. Genitourinary: No CVA tenderness. Musculoskeletal: Nontender with normal range of motion in all extremities. No edema, cyanosis, or erythema of extremities. Neurologic: Normal speech and language. Face is symmetric. Moving all extremities. No gross focal neurologic deficits are appreciated. Skin: Skin is warm, dry and intact. No rash noted. Psychiatric: Mood and affect are depressed. Speech and behavior are normal.  ____________________________________________   LABS (all labs ordered are listed, but only abnormal results are displayed)  Labs  Reviewed  COMPREHENSIVE METABOLIC PANEL - Abnormal; Notable for the following:       Result Value   CO2 18 (*)    Calcium 8.8 (*)    Total Protein 6.4 (*)    Albumin 3.1 (*)    ALT 12 (*)    All other components within normal limits  ACETAMINOPHEN LEVEL - Abnormal; Notable for the following:    Acetaminophen (Tylenol), Serum <10 (*)    All other components within normal limits  HCG, QUANTITATIVE, PREGNANCY - Abnormal; Notable for the following:    hCG, Beta Chain, Quant, S 47,115 (*)    All other components within normal limits  URINALYSIS COMPLETEWITH MICROSCOPIC ([facility] ONLY) - Abnormal; Notable for the following:    Color, Urine STRAW (*)    APPearance CLEAR (*)    Specific Gravity, Urine 1.003 (*)    Leukocytes, UA 1+ (*)    Bacteria, UA RARE (*)    Squamous Epithelial /  LPF 0-5 (*)    All other components within normal limits  CBC - Abnormal; Notable for the following:    Hemoglobin 10.6 (*)    HCT 31.8 (*)    RDW 19.8 (*)    All other components within normal limits  ACETAMINOPHEN LEVEL - Abnormal; Notable for the following:    Acetaminophen (Tylenol), Serum <10 (*)    All other components within normal limits  SALICYLATE LEVEL  ETHANOL  URINE DRUG SCREEN, QUALITATIVE ([facility] ONLY)   ____________________________________________  EKG  ED ECG REPORT I, Nita Sickle, the attending physician, personally viewed and interpreted this ECG. Sinus tachycardia, rate of 104, normal intervals, normal axis, no ST elevations or depressions. ____________________________________________  RADIOLOGY  none ____________________________________________   PROCEDURES  Procedure(s) performed: None Procedures Critical Care performed:  None ____________________________________________   INITIAL IMPRESSION / ASSESSMENT AND PLAN / ED COURSE  20 y.o. female with a history of anemia, anxiety, depression, PTSD who presents for evaluation after an intentional overdose by taking 10  x 12.5 mg Phenergan at 2:50 PM this afternoon. Patient currently is hemodynamically stable with normal vital signs, EKG with normal intervals. Discussed with poison control, watch patient on telemetry, give IV fluids, check labs including salicylate and Tylenol levels, alcohol, drug screen. We'll place IVC paperwork and consult psychiatry and TTS. Fetal heart tones 135  Clinical Course  Comment By Time  I spoke with poison control center who recommended observing patient for 6 hours, benzos for agitation or seizures, to recheck a Tylenol level IV hours from the ingestion, check electrolytes, and observe patient in telemetry [place], MD 09/15 1612  Patient was evaluated by Dr. Toni Amend psychiatry who cleared patient's IVC. He does not believe patient warrants admission. He thinks this is patient's borderline personality disorder and does not follow believe patient is suicidal. Patient has been observed for 6-1/2 hours from her ingestion per recommendations of poison control. She has been asymptomatic with stable vital signs. Her lab work is negative including acetaminophen levels at arrival and 4 hour repeat. She has had normal fetal heart tones. Patient will be discharged home at this time to follow-up at [facility] which she has an appointment next week. Patient is comfortable and denies SI at this time. Nita Sickle, MD 09/15 2124    Pertinent labs & imaging results that were available during my care of the patient were reviewed by me and considered in my medical decision making (see chart for details).    ____________________________________________   FINAL CLINICAL IMPRESSION(S) / ED DIAGNOSES  Final diagnoses:  Overdose, intentional self-harm, initial encounter (HCC)  Suicide attempt (HCC)      NEW MEDICATIONS STARTED DURING THIS VISIT:  New Prescriptions   No medications on file     Note:  This document was prepared using Dragon voice recognition software and may include  unintentional dictation errors.    Nita Sickle, MD 11/29/15 2129

## 2015-11-29 NOTE — ED Notes (Signed)
Fetal heart tones 136. Md notified.

## 2015-11-29 NOTE — ED Triage Notes (Signed)
Pt presents to the ED via EMS from home with reports of suicide attempt by taking #10 12.5mg  Phenergan about an hour prior to arrival.  EMS notified poison control prior to arrival. Pt also reports she took her prescribed dose of Zoloft. Pt recently discharged from the behavioral health hospital earlier this month.  Pt received 300ml of NS with EMS.  Pt states she is [redacted] weeks pregnant and that it is too late for an abortion and she does not want to give the baby up for adoption. Pt [facility] for [facility].
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
## 2015-11-30 LAB — URINE CULTURE: Culture: 70000 — AB

## 2015-12-19 ENCOUNTER — Emergency Department
Admission: EM | Admit: 2015-12-19 | Discharge: 2015-12-19 | Disposition: A | Discharge status: Home / Self Care | Payer: Medicaid Other | Attending: Emergency Medicine | Admitting: Emergency Medicine

## 2015-12-19 ENCOUNTER — Encounter: Payer: Self-pay | Admitting: Medical Oncology

## 2015-12-19 DIAGNOSIS — Z79899 Other long term (current) drug therapy: Secondary | ICD-10-CM | POA: Insufficient documentation

## 2015-12-19 DIAGNOSIS — R079 Chest pain, unspecified: Secondary | ICD-10-CM | POA: Diagnosis not present

## 2015-12-19 DIAGNOSIS — Z3A19 19 weeks gestation of pregnancy: Secondary | ICD-10-CM | POA: Insufficient documentation

## 2015-12-19 DIAGNOSIS — O99332 Smoking (tobacco) complicating pregnancy, second trimester: Secondary | ICD-10-CM | POA: Insufficient documentation

## 2015-12-19 DIAGNOSIS — O99612 Diseases of the digestive system complicating pregnancy, second trimester: Secondary | ICD-10-CM | POA: Insufficient documentation

## 2015-12-19 DIAGNOSIS — K2970 Gastritis, unspecified, without bleeding: Secondary | ICD-10-CM | POA: Diagnosis not present

## 2015-12-19 DIAGNOSIS — O26892 Other specified pregnancy related conditions, second trimester: Secondary | ICD-10-CM | POA: Diagnosis present

## 2015-12-19 DIAGNOSIS — J45909 Unspecified asthma, uncomplicated: Secondary | ICD-10-CM | POA: Insufficient documentation

## 2015-12-19 DIAGNOSIS — F17200 Nicotine dependence, unspecified, uncomplicated: Secondary | ICD-10-CM | POA: Insufficient documentation

## 2015-12-19 LAB — BASIC METABOLIC PANEL
Anion gap: 7 (ref 5–15)
BUN: 8 mg/dL (ref 6–20)
CO2: 19 mmol/L — ABNORMAL LOW (ref 22–32)
Calcium: 8.8 mg/dL — ABNORMAL LOW (ref 8.9–10.3)
Chloride: 110 mmol/L (ref 101–111)
Creatinine, Ser: 0.66 mg/dL (ref 0.44–1.00)
GFR calc Af Amer: >60 mL/min (ref >60)
GLUCOSE: 96 mg/dL (ref 65–99)
POTASSIUM: 3.3 mmol/L — AB (ref 3.5–5.1)
Sodium: 136 mmol/L (ref 135–145)

## 2015-12-19 LAB — CBC
HCT: 31 % — ABNORMAL LOW (ref 35.0–47.0)
Hemoglobin: 10.6 g/dL — ABNORMAL LOW (ref 12.0–16.0)
MCH: 28.6 pg (ref 26.0–34.0)
MCHC: 34.3 g/dL (ref 32.0–36.0)
MCV: 83.4 fL (ref 80.0–100.0)
Platelets: 248 10*3/uL (ref 150–440)
RBC: 3.71 MIL/uL — ABNORMAL LOW (ref 3.80–5.20)
RDW: 16.9 % — ABNORMAL HIGH (ref 11.5–14.5)
WBC: 7.1 10*3/uL (ref 3.6–11.0)

## 2015-12-19 LAB — TROPONIN I: Troponin I: <0.03 ng/mL

## 2015-12-19 MED ORDER — LIDOCAINE VISCOUS 2 % MT SOLN
15.0000 mL | Freq: Once | OROMUCOSAL | Status: AC
  Administered 2015-12-19: 15 mL via OROMUCOSAL
  Filled 2015-12-19: qty 15

## 2015-12-19 MED ORDER — DIPHENHYDRAMINE HCL 25 MG PO CAPS
ORAL_CAPSULE | ORAL | Status: AC
  Administered 2015-12-19: 25 mg via ORAL
  Filled 2015-12-19: qty 1

## 2015-12-19 MED ORDER — DIPHENHYDRAMINE HCL 25 MG PO CAPS
25.0000 mg | ORAL_CAPSULE | Freq: Once | ORAL | Status: AC
  Administered 2015-12-19: 25 mg via ORAL

## 2015-12-19 MED ORDER — SUCRALFATE 1 G PO TABS: 1.0000 g | ORAL_TABLET | Freq: Four times a day (QID) | ORAL | 0 refills | Status: DC

## 2015-12-19 MED ORDER — RANITIDINE HCL 150 MG PO TABS: 150.0000 mg | ORAL_TABLET | Freq: Two times a day (BID) | ORAL | 1 refills | Status: DC

## 2015-12-19 NOTE — ED Notes (Signed)
Pt c/o itching r/t lidocaine administration, asking MD for PO Benadryl

## 2015-12-19 NOTE — ED Provider Notes (Signed)
[facility] Emergency Department Provider Note   ____________________________________________   I have reviewed the triage vital signs and the nursing notes.   HISTORY  Chief Complaint Chest Pain   History limited by: Not Limited   HPI Kristy Reid is a [age] y.o. female, at roughly 5719 weeks pregnant, who presents to the emergency department today because of concerns for chest pain. It has been going on for a few days. It's located in the center and right side of her chest. She states it is worse with a deep breath. She does have a cough. It is nonproductive.The patient also states that the pain is worse with movement. She hasn't had any fevers.    Past Medical History:  Diagnosis Date  . Anemia   . Anemia   . Anxiety   . Asthma   . Chronic back pain   . Depression   . Insomnia   . PTSD (post-traumatic stress disorder)     Patient Active Problem List   Diagnosis Date Noted  . Overdose 11/29/2015  . Pregnancy (I trimester) 11/15/2015  . Cannabis use disorder, severe, dependence (HCC) 11/15/2015  . Tobacco use disorder 11/15/2015  . Asthma 11/15/2015  . Borderline personality disorder 11/15/2015  . PTSD (post-traumatic stress disorder) 11/15/2015  . Severe recurrent major depression without psychotic features (HCC) 11/13/2015    Past Surgical History:  Procedure Laterality Date  . BUNIONECTOMY Bilateral    feet  . wrist sugery      Prior to Admission medications   Medication Sig Start Date End Date Taking? Authorizing Provider  albuterol (PROVENTIL HFA;VENTOLIN HFA) 108 (90 BASE) MCG/ACT inhaler Inhale 2 puffs into the lungs every 6 (six) hours as needed for wheezing or shortness of breath. 01/06/15   Sharyn CreamerMark Quale, MD  clindamycin (CLEOCIN) 150 MG capsule Take 3 capsules (450 mg total) by mouth every 8 (eight) hours. 11/19/15   Jimmy FootmanAndrea Hernandez-Gonzalez, MD  docusate sodium (COLACE) 100 MG capsule Take 1 capsule (100 mg total) by mouth 2 (two)  times daily. Patient not taking: Reported on 08/31/2015 02/15/15 02/15/16  Minna AntisKevin Paduchowski, MD  prenatal vitamin w/FE, FA (PRENATAL 1 + 1) 27-1 MG TABS tablet Take 1 tablet by mouth daily. 11/19/15   Jimmy FootmanAndrea Hernandez-Gonzalez, MD  promethazine (PHENERGAN) 25 MG tablet Take 1 tablet (25 mg total) by mouth every 6 (six) hours as needed for nausea or vomiting. 11/13/15   Joni Reiningonald K Smith, PA-C  QUEtiapine (SEROQUEL) 25 MG tablet Take 1 tablet (25 mg total) by mouth at bedtime. 11/19/15   Jimmy FootmanAndrea Hernandez-Gonzalez, MD  sertraline (ZOLOFT) 50 MG tablet Take 1 tablet (50 mg total) by mouth daily. 11/19/15   Jimmy FootmanAndrea Hernandez-Gonzalez, MD    Allergies Penicillins and Rocephin [ceftriaxone]  Family History  Problem Relation Age of Onset  . Diabetes Mother   . Diabetes Maternal Grandmother   . Heart disease Maternal Grandmother   . Cancer Neg Hx   . Ovarian cancer Neg Hx     Social History Social History  Substance Use Topics  . Smoking status: Current Every Day Smoker    Last attempt to quit: 08/13/2015  . Smokeless tobacco: Never Used  . Alcohol use No    Review of Systems  Constitutional: Negative for fever. Cardiovascular: Positive for chest pain. Respiratory: Positive for shortness of breath. Gastrointestinal: Negative for abdominal pain, vomiting and diarrhea. Genitourinary: Negative for dysuria. Musculoskeletal: Positive for back pain. Skin: Negative for rash. Neurological: Negative for headaches, focal weakness or numbness.  10-point ROS otherwise negative.  ____________________________________________   PHYSICAL EXAM:  VITAL SIGNS: ED Triage Vitals  Enc Vitals Group     BP 12/19/15 1451 110/62     Pulse Rate 12/19/15 1451 (!) 115     Resp 12/19/15 1451 18     Temp 12/19/15 1451 98.1 F (36.7 C)     Temp Source 12/19/15 1451 Oral     SpO2 12/19/15 1451 99 %     Weight 12/19/15 1454 165 lb (74.8 kg)     Height 12/19/15 1454 5\' 2"  (1.575 m)     Head Circumference --       Peak Flow --      Pain Score 12/19/15 1454 8   Constitutional: Alert and oriented. Well appearing and in no distress. Eyes: Conjunctivae are normal. Normal extraocular movements. ENT   Head: Normocephalic and atraumatic.   Nose: No congestion/rhinnorhea.   Mouth/Throat: Mucous membranes are moist.   Neck: No stridor. Hematological/Lymphatic/Immunilogical: No cervical lymphadenopathy. Cardiovascular: Normal rate, regular rhythm.  No murmurs, rubs, or gallops. Respiratory: Normal respiratory effort without tachypnea nor retractions. Breath sounds are clear and equal bilaterally. No wheezes/rales/rhonchi. Gastrointestinal: Soft and nontender. Gravid.  Genitourinary: Deferred Musculoskeletal: Normal range of motion in all extremities. No lower extremity edema. Chest wall is tender to palpation over the sternum. Neurologic:  Normal speech and language. No gross focal neurologic deficits are appreciated.  Skin:  Skin is warm, dry and intact. No rash noted. Psychiatric: Mood and affect are normal. Speech and behavior are normal. Patient exhibits appropriate insight and judgment.  ____________________________________________    LABS (pertinent positives/negatives)  Labs Reviewed  BASIC METABOLIC PANEL - Abnormal; Notable for the following:       Result Value   Potassium 3.3 (*)    CO2 19 (*)    Calcium 8.8 (*)    All other components within normal limits  CBC - Abnormal; Notable for the following:    RBC 3.71 (*)    Hemoglobin 10.6 (*)    HCT 31.0 (*)    RDW 16.9 (*)    All other components within normal limits  TROPONIN I     ____________________________________________   EKG  I, Phineas Semen, attending physician, personally viewed and interpreted this EKG  EKG Time: 1451 Rate: 99 Rhythm: normal sinus rhythm Axis: normal Intervals: qtc 441 QRS: narrow ST changes: no st elevation Impression: normal ekg   ____________________________________________     RADIOLOGY  None  ___________________________________________   PROCEDURES  Procedures  ____________________________________________   INITIAL IMPRESSION / ASSESSMENT AND PLAN / ED COURSE  Pertinent labs & imaging results that were available during my care of the patient were reviewed by me and considered in my medical decision making (see chart for details).  Patient stated that her chest pain did improve after GI cocktail. At this point I did have a discussion with the patient about potential concern for blood clots. However patient has been evaluated for blood clots at least twice in the past year receiving CT angiograms both times. There were both negative. This point not only my concern about radiation risks to the patient but she is also pregnant. She's not had any bloody cough or unilateral leg swelling. She felt comfortable at this time deferring CT scan given her risk of radiation. Given that the patient did have relief with GI cocktail think there is an alternative diagnosis that could explain the patient's discomfort. Will give patient an acids and sucralfate. Did instruct patient on  return precautions. ____________________________________________   FINAL CLINICAL IMPRESSION(S) / ED DIAGNOSES  Final diagnoses:  Nonspecific chest pain  Gastritis without bleeding, unspecified chronicity, unspecified gastritis type     Note: This dictation was prepared with Dragon dictation. Any transcriptional errors that result from this process are unintentional    Phineas Semen, MD 12/19/15 2033

## 2015-12-19 NOTE — ED Notes (Signed)
Peripheral IV removed that was not charted during triage.  20G LH, area was clean, dry, and skin intact with catheter intact.

## 2015-12-19 NOTE — Discharge Instructions (Signed)
Please seek medical attention for any high fevers, chest pain, shortness of breath, change in behavior, persistent vomiting, bloody stool or any other new or concerning symptoms.  

## 2015-12-19 NOTE — ED Triage Notes (Signed)
Pt reports that for the past 3 days she has been having sharp stabbing pains to the center of her chest and to the right. Pt reports that she has also had a cough. Pt in [redacted] weeks pregnant.

## 2015-12-26 ENCOUNTER — Observation Stay
Admission: EM | Admit: 2015-12-26 | Discharge: 2015-12-26 | Disposition: A | Discharge status: Home / Self Care | Payer: Medicaid Other | Attending: Obstetrics and Gynecology | Admitting: Obstetrics and Gynecology

## 2015-12-26 ENCOUNTER — Encounter: Payer: Self-pay | Admitting: *Deleted

## 2015-12-26 DIAGNOSIS — K5900 Constipation, unspecified: Secondary | ICD-10-CM | POA: Diagnosis not present

## 2015-12-26 DIAGNOSIS — O2690 Pregnancy related conditions, unspecified, unspecified trimester: Principal | ICD-10-CM | POA: Insufficient documentation

## 2015-12-26 DIAGNOSIS — O99612 Diseases of the digestive system complicating pregnancy, second trimester: Secondary | ICD-10-CM

## 2015-12-26 DIAGNOSIS — O26899 Other specified pregnancy related conditions, unspecified trimester: Secondary | ICD-10-CM | POA: Diagnosis present

## 2015-12-26 DIAGNOSIS — Z3A00 Weeks of gestation of pregnancy not specified: Secondary | ICD-10-CM | POA: Insufficient documentation

## 2015-12-26 DIAGNOSIS — Z79899 Other long term (current) drug therapy: Secondary | ICD-10-CM | POA: Insufficient documentation

## 2015-12-26 LAB — URINE DRUG SCREEN, QUALITATIVE (ARMC ONLY)
AMPHETAMINES, UR SCREEN: NOT DETECTED
BARBITURATES, UR SCREEN: NOT DETECTED
BENZODIAZEPINE, UR SCRN: NOT DETECTED
Cannabinoid 50 Ng, Ur ~~LOC~~: NOT DETECTED
Cocaine Metabolite,Ur ~~LOC~~: NOT DETECTED
MDMA (Ecstasy)Ur Screen: NOT DETECTED
METHADONE SCREEN, URINE: NOT DETECTED
OPIATE, UR SCREEN: NOT DETECTED
Phencyclidine (PCP) Ur S: NOT DETECTED
TRICYCLIC, UR SCREEN: NOT DETECTED

## 2015-12-26 MED ORDER — LACTULOSE 10 GM/15ML PO SOLN: 20.0000 g | Freq: Two times a day (BID) | ORAL | 0 refills | Status: DC | PRN

## 2015-12-26 NOTE — Final Progress Note (Signed)
Physician Final Progress Note  Patient ID: Kristy Reid MRN: [id number] DOB/AGE: 10-06-1995 19 y.o.  Admit date: 12/26/2015 Admitting provider: Vena Austria, MD Discharge date: 12/26/2015   Admission Diagnoses: constipation  Discharge Diagnoses:  Active Problems:   Constipation during pregnancy in second trimester   Consults: None  Significant Findings/ Diagnostic Studies: UDS pending  Procedures: none  Discharge Condition: good  Disposition: 01-Home or Self Care  Diet: Regular diet  Discharge Activity: Activity as tolerated  Discharge Instructions    Discharge activity:  No Restrictions    Complete by:  As directed    No sexual activity restrictions    Complete by:  As directed    Notify physician for a general feeling that "something is not right"    Complete by:  As directed    Notify physician for increase or change in vaginal discharge    Complete by:  As directed    Notify physician for intestinal cramps, with or without diarrhea, sometimes described as "gas pain"    Complete by:  As directed    Notify physician for leaking of fluid    Complete by:  As directed    Notify physician for low, dull backache, unrelieved by heat or Tylenol    Complete by:  As directed    Notify physician for menstrual like cramps    Complete by:  As directed    Notify physician for pelvic pressure    Complete by:  As directed    Notify physician for uterine contractions.  These may be painless and feel like the uterus is tightening or the baby is  "balling up"    Complete by:  As directed    Notify physician for vaginal bleeding    Complete by:  As directed    PRETERM LABOR:  Includes any of the follwing symptoms that occur between 20 - [redacted] weeks gestation.  If these symptoms are not stopped, preterm labor can result in preterm delivery, placing your baby at risk    Complete by:  As directed        Medication List    STOP taking these medications   clindamycin 150 MG  capsule Commonly known as:  CLEOCIN   prenatal vitamin w/FE, FA 27-1 MG Tabs tablet     TAKE these medications   acetaminophen 325 MG tablet Commonly known as:  TYLENOL Take 650 mg by mouth every 6 (six) hours as needed for mild pain or headache.   albuterol 108 (90 Base) MCG/ACT inhaler Commonly known as:  PROVENTIL HFA;VENTOLIN HFA Inhale 2 puffs into the lungs every 6 (six) hours as needed for wheezing or shortness of breath.   beclomethasone 40 MCG/ACT inhaler Commonly known as:  QVAR Inhale 1 puff into the lungs 2 (two) times daily.   docusate sodium 100 MG capsule Commonly known as:  COLACE Take 1 capsule (100 mg total) by mouth 2 (two) times daily.   lactulose 10 GM/15ML solution Commonly known as:  CHRONULAC Take 30 mLs (20 g total) by mouth 2 (two) times daily as needed for severe constipation.   polyethylene glycol powder powder Commonly known as:  GLYCOLAX/MIRALAX Take 1 Container by mouth once.   promethazine 25 MG tablet Commonly known as:  PHENERGAN Take 1 tablet (25 mg total) by mouth every 6 (six) hours as needed for nausea or vomiting.   QUEtiapine 25 MG tablet Commonly known as:  SEROQUEL Take 1 tablet (25 mg total) by mouth at bedtime.   ranitidine  150 MG tablet Commonly known as:  ZANTAC Take 1 tablet (150 mg total) by mouth 2 (two) times daily.   sertraline 50 MG tablet Commonly known as:  ZOLOFT Take 1 tablet (50 mg total) by mouth daily.   sucralfate 1 g tablet Commonly known as:  CARAFATE Take 1 tablet (1 g total) by mouth 4 (four) times daily.      HOLD prenatal vitamins until bowl movement have regulated.  Wet mount today negative for trichomonas   Total time spent taking care of this patient: 15 minutes  Signed: [name], Kristy Reid M 12/26/2015, 2:43 PM

## 2015-12-26 NOTE — ED Triage Notes (Signed)
Patient states that she has been constipated x 2 months.  Called OB who referred patient to ED for evaluation.  Patient c/o abdominal pain and back pain x 2 1/2 weeks and decreased fetal movement x 3 days.  Patient is [redacted] weeks pregnant.

## 2016-01-21 ENCOUNTER — Inpatient Hospital Stay
Admission: EM | Admit: 2016-01-21 | Discharge: 2016-01-21 | Disposition: A | Discharge status: Home / Self Care | Payer: Medicaid Other | Attending: Certified Nurse Midwife | Admitting: Certified Nurse Midwife

## 2016-01-21 DIAGNOSIS — O26899 Other specified pregnancy related conditions, unspecified trimester: Secondary | ICD-10-CM | POA: Diagnosis present

## 2016-01-21 DIAGNOSIS — Z3A24 24 weeks gestation of pregnancy: Secondary | ICD-10-CM | POA: Insufficient documentation

## 2016-01-21 DIAGNOSIS — R109 Unspecified abdominal pain: Secondary | ICD-10-CM | POA: Insufficient documentation

## 2016-01-21 DIAGNOSIS — O26892 Other specified pregnancy related conditions, second trimester: Secondary | ICD-10-CM | POA: Diagnosis present

## 2016-01-21 LAB — URINALYSIS COMPLETE WITH MICROSCOPIC (ARMC ONLY)
BILIRUBIN URINE: NEGATIVE
Glucose, UA: NEGATIVE mg/dL
HGB URINE DIPSTICK: NEGATIVE
KETONES UR: NEGATIVE mg/dL
NITRITE: NEGATIVE
PH: 6 (ref 5.0–8.0)
Protein, ur: 30 mg/dL — AB
Specific Gravity, Urine: 1.026 (ref 1.005–1.030)

## 2016-01-21 MED ORDER — NITROFURANTOIN MONOHYD MACRO 100 MG PO CAPS
100.0000 mg | ORAL_CAPSULE | Freq: Once | ORAL | Status: AC
  Administered 2016-01-21: 100 mg via ORAL
  Filled 2016-01-21: qty 1

## 2016-01-21 MED ORDER — ACETAMINOPHEN 500 MG PO TABS: 1000.0000 mg | ORAL_TABLET | Freq: Four times a day (QID) | ORAL | Status: DC | PRN
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
## 2016-01-21 NOTE — OB Triage Note (Signed)
[name] her with c/o pain on & off all day, loss of mucous plug, pain in vagina, decreased fetal movement. Reports spotting this AM, last intercourse "couple days ago".

## 2016-01-23 LAB — URINE CULTURE

## 2016-01-29 ENCOUNTER — Emergency Department
Admission: EM | Admit: 2016-01-29 | Discharge: 2016-01-29 | Disposition: A | Discharge status: Home / Self Care | Payer: Medicaid Other | Attending: Emergency Medicine | Admitting: Emergency Medicine

## 2016-01-29 ENCOUNTER — Encounter: Payer: Self-pay | Admitting: Emergency Medicine

## 2016-01-29 DIAGNOSIS — Z791 Long term (current) use of non-steroidal anti-inflammatories (NSAID): Secondary | ICD-10-CM | POA: Diagnosis not present

## 2016-01-29 DIAGNOSIS — R197 Diarrhea, unspecified: Secondary | ICD-10-CM | POA: Diagnosis not present

## 2016-01-29 DIAGNOSIS — Z79899 Other long term (current) drug therapy: Secondary | ICD-10-CM | POA: Insufficient documentation

## 2016-01-29 DIAGNOSIS — O99332 Smoking (tobacco) complicating pregnancy, second trimester: Secondary | ICD-10-CM | POA: Diagnosis not present

## 2016-01-29 DIAGNOSIS — O26892 Other specified pregnancy related conditions, second trimester: Secondary | ICD-10-CM | POA: Diagnosis not present

## 2016-01-29 DIAGNOSIS — F17210 Nicotine dependence, cigarettes, uncomplicated: Secondary | ICD-10-CM | POA: Insufficient documentation

## 2016-01-29 DIAGNOSIS — R55 Syncope and collapse: Secondary | ICD-10-CM | POA: Diagnosis not present

## 2016-01-29 DIAGNOSIS — J45909 Unspecified asthma, uncomplicated: Secondary | ICD-10-CM | POA: Insufficient documentation

## 2016-01-29 DIAGNOSIS — Z3A25 25 weeks gestation of pregnancy: Secondary | ICD-10-CM | POA: Insufficient documentation

## 2016-01-29 LAB — LIPASE, BLOOD: Lipase: 36 U/L (ref 11–51)

## 2016-01-29 LAB — URINALYSIS COMPLETE WITH MICROSCOPIC (ARMC ONLY)
BILIRUBIN URINE: NEGATIVE
Bacteria, UA: NONE SEEN
GLUCOSE, UA: NEGATIVE mg/dL
HGB URINE DIPSTICK: NEGATIVE
Ketones, ur: NEGATIVE mg/dL
NITRITE: NEGATIVE
Protein, ur: 100 mg/dL — AB
SPECIFIC GRAVITY, URINE: 1.026 (ref 1.005–1.030)
pH: 6 (ref 5.0–8.0)

## 2016-01-29 LAB — CBC
HEMATOCRIT: 30.8 % — AB (ref 35.0–47.0)
Hemoglobin: 9.9 g/dL — ABNORMAL LOW (ref 12.0–16.0)
MCH: 27 pg (ref 26.0–34.0)
MCHC: 32.2 g/dL (ref 32.0–36.0)
MCV: 83.7 fL (ref 80.0–100.0)
PLATELETS: 217 10*3/uL (ref 150–440)
RBC: 3.67 MIL/uL — ABNORMAL LOW (ref 3.80–5.20)
RDW: 16.3 % — AB (ref 11.5–14.5)
WBC: 7.9 10*3/uL (ref 3.6–11.0)

## 2016-01-29 LAB — HCG, QUANTITATIVE, PREGNANCY: hCG, Beta Chain, Quant, S: 20118 m[IU]/mL — ABNORMAL HIGH (ref <5)

## 2016-01-29 LAB — COMPREHENSIVE METABOLIC PANEL
ALT: 17 U/L (ref 14–54)
AST: 25 U/L (ref 15–41)
Albumin: 2.9 g/dL — ABNORMAL LOW (ref 3.5–5.0)
Alkaline Phosphatase: 76 U/L (ref 38–126)
Anion gap: 7 (ref 5–15)
BILIRUBIN TOTAL: 0.4 mg/dL (ref 0.3–1.2)
CALCIUM: 8.7 mg/dL — AB (ref 8.9–10.3)
CO2: 19 mmol/L — ABNORMAL LOW (ref 22–32)
CREATININE: 0.56 mg/dL (ref 0.44–1.00)
Chloride: 109 mmol/L (ref 101–111)
GFR calc Af Amer: >60 mL/min (ref >60)
Glucose, Bld: 87 mg/dL (ref 65–99)
POTASSIUM: 3.2 mmol/L — AB (ref 3.5–5.1)
Sodium: 135 mmol/L (ref 135–145)
TOTAL PROTEIN: 7.1 g/dL (ref 6.5–8.1)

## 2016-01-29 NOTE — ED Triage Notes (Signed)
Pt ambulatory to triage with steady gait. Pt reports having syncopal episode today and 2 days ago. Pt reports hx of anemia during previous pregnancy. G2P1, pt is [redacted] weeks pregnant. Pt c/o generalized abdominal cramping.

## 2016-01-29 NOTE — Discharge Instructions (Signed)
As we discussed please eat plenty of small meals/snacks throughout the day. Please drink plenty of fluids. Return to the emergency department for any pain, further syncopal episodes (these passing out), any chest pain, trouble breathing, or any other symptom personally concerning to yourself.

## 2016-01-29 NOTE — ED Provider Notes (Signed)
[facility] Emergency Department Provider Note  Time seen: 8:56 PM  I have reviewed the triage vital signs and the nursing notes.   HISTORY  Chief Complaint Loss of Consciousness    HPI Kristy Reid is a 20 y.o. female approximately [redacted] weeks pregnant who presents to the emergency Department after syncopal episode. According to the patient around 12 PM today she began feeling lightheaded and had a syncopal episode. She also states 3 days ago she had a syncopal episode as well. Patient has a history of anemia requiring transfusion in the past. She called her doctor who referred her to the emergency department so we could evaluate lab work to make sure she did not require a transfusion. Patient denies any abdominal pain. States she has been feeling dizzy at times over the past several days. States some nausea but denies vomiting. Patient does state she has been having intermittent episodes of diarrhea over the past 2 weeks. States she was diagnosed with a sinus infection and strep throat approximately 3 weeks ago but that has resolved.  Past Medical History:  Diagnosis Date  . Anemia   . Anemia   . Anxiety   . Asthma   . Chronic back pain   . Depression   . Insomnia   . PTSD (post-traumatic stress disorder)     Patient Active Problem List   Diagnosis Date Noted  . Abdominal pain in pregnancy 01/21/2016  . Constipation during pregnancy in second trimester 12/26/2015  . Overdose 11/29/2015  . Pregnancy (I trimester) 11/15/2015  . Cannabis use disorder, severe, dependence (HCC) 11/15/2015  . Tobacco use disorder 11/15/2015  . Asthma 11/15/2015  . Borderline personality disorder 11/15/2015  . PTSD (post-traumatic stress disorder) 11/15/2015  . Severe recurrent major depression without psychotic features (HCC) 11/13/2015    Past Surgical History:  Procedure Laterality Date  . BUNIONECTOMY Bilateral    feet  . wrist sugery      Prior to Admission  medications   Medication Sig Start Date End Date Taking? Authorizing Provider  acetaminophen (TYLENOL) 325 MG tablet Take 650 mg by mouth every 6 (six) hours as needed for mild pain or headache.    Historical Provider, MD  albuterol (PROVENTIL HFA;VENTOLIN HFA) 108 (90 BASE) MCG/ACT inhaler Inhale 2 puffs into the lungs every 6 (six) hours as needed for wheezing or shortness of breath. 01/06/15   Sharyn Creamer, MD  beclomethasone (QVAR) 40 MCG/ACT inhaler Inhale 1 puff into the lungs 2 (two) times daily.    Historical Provider, MD  docusate sodium (COLACE) 100 MG capsule Take 1 capsule (100 mg total) by mouth 2 (two) times daily. 02/15/15 02/15/16  Minna Antis, MD  lactulose (CHRONULAC) 10 GM/15ML solution Take 30 mLs (20 g total) by mouth 2 (two) times daily as needed for severe constipation. 12/26/15   Vena Austria, MD  polyethylene glycol powder (GLYCOLAX/MIRALAX) powder Take 1 Container by mouth once.    Historical Provider, MD  promethazine (PHENERGAN) 25 MG tablet Take 1 tablet (25 mg total) by mouth every 6 (six) hours as needed for nausea or vomiting. 11/13/15   Joni Reining, PA-C  QUEtiapine (SEROQUEL) 25 MG tablet Take 1 tablet (25 mg total) by mouth at bedtime. 11/19/15   Jimmy Footman, MD  ranitidine (ZANTAC) 150 MG tablet Take 1 tablet (150 mg total) by mouth 2 (two) times daily. 12/19/15 12/18/16  Phineas Semen, MD  sertraline (ZOLOFT) 50 MG tablet Take 1 tablet (50 mg total) by mouth  daily. 11/19/15   Jimmy FootmanAndrea Hernandez-Gonzalez, MD  sucralfate (CARAFATE) 1 g tablet Take 1 tablet (1 g total) by mouth 4 (four) times daily. 12/19/15   Phineas SemenGraydon Goodman, MD    Allergies  Allergen Reactions  . Penicillins Hives    Has patient had a PCN reaction causing immediate rash, facial/tongue/throat swelling, SOB or lightheadedness with hypotension: No Has patient had a PCN reaction causing severe rash involving mucus membranes or skin necrosis: No Has patient had a PCN reaction that  required hospitalization No Has patient had a PCN reaction occurring within the last 10 years: No If all of the above answers are "NO", then may proceed with Cephalosporin use.  . Rocephin [Ceftriaxone] Hives  . Lidocaine Hives, Itching and Rash    Family History  Problem Relation Age of Onset  . Diabetes Mother   . Diabetes Maternal Grandmother   . Heart disease Maternal Grandmother   . Cancer Neg Hx   . Ovarian cancer Neg Hx     Social History Social History  Substance Use Topics  . Smoking status: Current Every Day Smoker    Types: Cigarettes    Last attempt to quit: 08/13/2015  . Smokeless tobacco: Never Used  . Alcohol use No    Review of Systems Constitutional: Negative for fever. Cardiovascular: Negative for chest pain. Respiratory: Negative for shortness of breath. Gastrointestinal: Negative for abdominal pain. Positive for diarrhea. Genitourinary: Negative for dysuria. Neurological: Negative for headache 10-point ROS otherwise negative.  ____________________________________________   PHYSICAL EXAM:  VITAL SIGNS: ED Triage Vitals  Enc Vitals Group     BP 01/29/16 2006 124/75     Pulse Rate 01/29/16 2006 93     Resp 01/29/16 2006 18     Temp 01/29/16 2006 98.8 F (37.1 C)     Temp Source 01/29/16 2006 Oral     SpO2 01/29/16 2006 100 %     Weight 01/29/16 2007 170 lb (77.1 kg)     Height 01/29/16 2007 5\' 2"  (1.575 m)     Head Circumference --      Peak Flow --      Pain Score 01/29/16 2007 8     Pain Loc --      Pain Edu? --      Excl. in GC? --     Constitutional: Alert and oriented. Well appearing and in no distress. Eyes: Normal exam ENT   Head: Normocephalic and atraumatic.   Mouth/Throat: Mucous membranes are moist. Cardiovascular: Normal rate, regular rhythm. No murmur Respiratory: Normal respiratory effort without tachypnea nor retractions. Breath sounds are clear  Gastrointestinal: Soft, gravid abdomen, nontender. Musculoskeletal:  Nontender with normal range of motion in all extremities.  Neurologic:  Normal speech and language. No gross focal neurologic deficits Skin:  Skin is warm, dry and intact.  Psychiatric: Mood and affect are normal.  ____________________________________________    EKG  EKG reviewed and interpreted, social normal sinus rhythm at 91 bpm, narrow QRS, normal axis, normal intervals, no acute changes. Normal EKG.  ____________________________________________   INITIAL IMPRESSION / ASSESSMENT AND PLAN / ED COURSE  Pertinent labs & imaging results that were available during my care of the patient were reviewed by me and considered in my medical decision making (see chart for details).  Patient presents the emergency department after a syncopal episode today. Patient states she has been feeling dizzy over the past several days. States she's had intermittent diarrhea over the past 2 weeks. Denies any abdominal pain. Denies vaginal  bleeding. Patient has a history of anemia requiring blood transfusions in the past. Patient's blood work today shows a hemoglobin of 9.9. Urinalysis is largely within normal limits. Chemistry shows no concerning findings. We will obtain an EKG, bedside ultrasound. Overall the patient appears well. I suspect the syncope is likely related to the patient's pregnancy. I discussed adequate hydration, and OB follow-up. Patient agreeable to plan.  Patient's labs are largely within normal limits. EKG is normal. Patient is anemic, but I do not believe this level of anemia because the patient's symptoms. Especially with her history of chronic anemia. Patient's EKG is very reassuring. Bedside ultrasound shows a normal fetal heart rate 143 bpm, good fetal movement. We'll discharge the patient with OB follow-up. I discussed with the patient eating plenty of small meals/snacks throughout the day, increasing her oral fluids and obtaining plenty of rest when she is  able.  ____________________________________________   FINAL CLINICAL IMPRESSION(S) / ED DIAGNOSES  Syncope    Minna AntisKevin Monaca Wadas, MD 01/29/16 2205

## 2016-01-29 NOTE — ED Notes (Signed)
Pt. States for the past week she has had some near syncope episodes.  Pt. States seeing her ob/gyn last week.  Pt. States two days ago having a near syncope episode while bending over picking up phone charger.  Pt. States today while running errands had a syncope episode while in the car.  Pt. States having diarrhea for the past 3 days.

## 2016-01-29 NOTE — ED Triage Notes (Signed)
Patient ambulatory to triage with steady gait, without difficulty or distress noted; pt reports [redacted]wks pregnant[facility] [place]; syncopal episode today and was told by OB/Gyn to come for evaluation due to hx of low blood counts with previous transfusion

## 2016-01-29 NOTE — ED Notes (Signed)
Pt. States she was taken off iron pills due to constipation.

## 2016-02-23 ENCOUNTER — Emergency Department: Payer: Medicaid Other

## 2016-02-23 ENCOUNTER — Emergency Department
Admission: EM | Admit: 2016-02-23 | Discharge: 2016-02-23 | Disposition: A | Discharge status: Home / Self Care | Payer: Medicaid Other | Attending: Emergency Medicine | Admitting: Emergency Medicine

## 2016-02-23 DIAGNOSIS — O2343 Unspecified infection of urinary tract in pregnancy, third trimester: Secondary | ICD-10-CM | POA: Insufficient documentation

## 2016-02-23 DIAGNOSIS — Y929 Unspecified place or not applicable: Secondary | ICD-10-CM | POA: Diagnosis not present

## 2016-02-23 DIAGNOSIS — W1839XA Other fall on same level, initial encounter: Secondary | ICD-10-CM | POA: Insufficient documentation

## 2016-02-23 DIAGNOSIS — Y9389 Activity, other specified: Secondary | ICD-10-CM | POA: Diagnosis not present

## 2016-02-23 DIAGNOSIS — O99513 Diseases of the respiratory system complicating pregnancy, third trimester: Secondary | ICD-10-CM | POA: Insufficient documentation

## 2016-02-23 DIAGNOSIS — R8271 Bacteriuria: Secondary | ICD-10-CM

## 2016-02-23 DIAGNOSIS — Z3A29 29 weeks gestation of pregnancy: Secondary | ICD-10-CM | POA: Insufficient documentation

## 2016-02-23 DIAGNOSIS — M25552 Pain in left hip: Secondary | ICD-10-CM | POA: Diagnosis not present

## 2016-02-23 DIAGNOSIS — Y999 Unspecified external cause status: Secondary | ICD-10-CM | POA: Diagnosis not present

## 2016-02-23 DIAGNOSIS — J069 Acute upper respiratory infection, unspecified: Secondary | ICD-10-CM | POA: Diagnosis not present

## 2016-02-23 DIAGNOSIS — O9A213 Injury, poisoning and certain other consequences of external causes complicating pregnancy, third trimester: Secondary | ICD-10-CM | POA: Diagnosis present

## 2016-02-23 DIAGNOSIS — Z87891 Personal history of nicotine dependence: Secondary | ICD-10-CM | POA: Insufficient documentation

## 2016-02-23 DIAGNOSIS — O99891 Other specified diseases and conditions complicating pregnancy: Secondary | ICD-10-CM

## 2016-02-23 DIAGNOSIS — W19XXXA Unspecified fall, initial encounter: Secondary | ICD-10-CM

## 2016-02-23 DIAGNOSIS — Z79899 Other long term (current) drug therapy: Secondary | ICD-10-CM | POA: Diagnosis not present

## 2016-02-23 DIAGNOSIS — J45909 Unspecified asthma, uncomplicated: Secondary | ICD-10-CM | POA: Insufficient documentation

## 2016-02-23 DIAGNOSIS — B9789 Other viral agents as the cause of diseases classified elsewhere: Secondary | ICD-10-CM

## 2016-02-23 DIAGNOSIS — O9989 Other specified diseases and conditions complicating pregnancy, childbirth and the puerperium: Secondary | ICD-10-CM

## 2016-02-23 LAB — URINALYSIS, COMPLETE (UACMP) WITH MICROSCOPIC
BILIRUBIN URINE: NEGATIVE
Glucose, UA: NEGATIVE mg/dL
Hgb urine dipstick: NEGATIVE
Ketones, ur: NEGATIVE mg/dL
Nitrite: NEGATIVE
PROTEIN: 30 mg/dL — AB
SPECIFIC GRAVITY, URINE: 1.018 (ref 1.005–1.030)
pH: 7 (ref 5.0–8.0)

## 2016-02-23 LAB — BASIC METABOLIC PANEL
Anion gap: 8 (ref 5–15)
BUN: 6 mg/dL (ref 6–20)
CALCIUM: 8.8 mg/dL — AB (ref 8.9–10.3)
CO2: 21 mmol/L — AB (ref 22–32)
Chloride: 106 mmol/L (ref 101–111)
Creatinine, Ser: 0.66 mg/dL (ref 0.44–1.00)
GFR calc Af Amer: >60 mL/min (ref >60)
GLUCOSE: 107 mg/dL — AB (ref 65–99)
Potassium: 3.7 mmol/L (ref 3.5–5.1)
Sodium: 135 mmol/L (ref 135–145)

## 2016-02-23 LAB — CBC WITH DIFFERENTIAL/PLATELET
Basophils Absolute: 0 10*3/uL (ref 0–0.1)
Basophils Relative: 0 %
EOS PCT: 2 %
Eosinophils Absolute: 0.2 10*3/uL (ref 0–0.7)
HCT: 30.5 % — ABNORMAL LOW (ref 35.0–47.0)
Hemoglobin: 10.1 g/dL — ABNORMAL LOW (ref 12.0–16.0)
LYMPHS ABS: 2.2 10*3/uL (ref 1.0–3.6)
LYMPHS PCT: 24 %
MCH: 26.5 pg (ref 26.0–34.0)
MCHC: 32.9 g/dL (ref 32.0–36.0)
MCV: 80.5 fL (ref 80.0–100.0)
MONO ABS: 0.8 10*3/uL (ref 0.2–0.9)
MONOS PCT: 8 %
Neutro Abs: 5.9 10*3/uL (ref 1.4–6.5)
Neutrophils Relative %: 66 %
PLATELETS: 244 10*3/uL (ref 150–440)
RBC: 3.79 MIL/uL — ABNORMAL LOW (ref 3.80–5.20)
RDW: 16.8 % — AB (ref 11.5–14.5)
WBC: 9.1 10*3/uL (ref 3.6–11.0)

## 2016-02-23 LAB — INFLUENZA PANEL BY PCR (TYPE A & B)
INFLAPCR: NEGATIVE
INFLBPCR: NEGATIVE

## 2016-02-23 MED ORDER — NITROFURANTOIN MONOHYD MACRO 100 MG PO CAPS: 100.0000 mg | ORAL_CAPSULE | Freq: Two times a day (BID) | ORAL | 0 refills | Status: AC

## 2016-02-23 MED ORDER — ACETAMINOPHEN 500 MG PO TABS
1000.0000 mg | ORAL_TABLET | Freq: Once | ORAL | Status: AC
Start: 2016-02-23 — End: 2016-02-23
  Administered 2016-02-23: 1000 mg via ORAL
  Filled 2016-02-23: qty 2

## 2016-02-23 MED ORDER — OXYMETAZOLINE HCL 0.05 % NA SOLN: 2.0000 | Freq: Two times a day (BID) | NASAL | 0 refills | Status: DC | PRN

## 2016-02-23 NOTE — Discharge Instructions (Signed)

## 2016-02-23 NOTE — ED Provider Notes (Signed)
[facility] Emergency Department Provider Note  ____________________________________________  Time seen: Approximately 4:23 PM  I have reviewed the triage vital signs and the nursing notes.   HISTORY  Chief Complaint Cough and Hip Pain   HPI Kristy Reid is a 20 y.o. female currently at [redacted] weeks GA who presents for evaluation of cough, congestion, sore throat and fever x 3 days. Vaccines up to date including flu. Had fever of 101.54F yesterday. Patient reports she has had a hard time sleeping because her nose is clogged up. She has not tried any nasal decongestant. She denies shortness of breath, wheezing, chest pain, abdominal pain, nausea, vomiting, diarrhea, dysuria. Patient has established care for this pregnancy and has had normal prenatal care. Patient also complaining of left hip pain. She reports that today she try to help her mom stand up after a fall when she lost her balance and fell onto her left hip. She was able to stand up with assistance but has had severe pain in the lateral aspect of her left hip, constant, nonradiating, worse with movement of the hip, present since her fall just prior to arrival.  Past Medical History:  Diagnosis Date  . Anemia   . Anemia   . Anxiety   . Asthma   . Chronic back pain   . Depression   . Insomnia   . PTSD (post-traumatic stress disorder)     Patient Active Problem List   Diagnosis Date Noted  . Abdominal pain in pregnancy 01/21/2016  . Constipation during pregnancy in second trimester 12/26/2015  . Overdose 11/29/2015  . Pregnancy (I trimester) 11/15/2015  . Cannabis use disorder, severe, dependence (HCC) 11/15/2015  . Tobacco use disorder 11/15/2015  . Asthma 11/15/2015  . Borderline personality disorder 11/15/2015  . PTSD (post-traumatic stress disorder) 11/15/2015  . Severe recurrent major depression without psychotic features (HCC) 11/13/2015    Past Surgical History:  Procedure Laterality  Date  . BUNIONECTOMY Bilateral    feet  . wrist sugery      Prior to Admission medications   Medication Sig Start Date End Date Taking? Authorizing Provider  acetaminophen (TYLENOL) 325 MG tablet Take 650 mg by mouth every 6 (six) hours as needed for mild pain or headache.    Historical Provider, MD  albuterol (PROVENTIL HFA;VENTOLIN HFA) 108 (90 BASE) MCG/ACT inhaler Inhale 2 puffs into the lungs every 6 (six) hours as needed for wheezing or shortness of breath. 01/06/15   Sharyn Creamer, MD  beclomethasone (QVAR) 40 MCG/ACT inhaler Inhale 1 puff into the lungs 2 (two) times daily.    Historical Provider, MD  lactulose (CHRONULAC) 10 GM/15ML solution Take 30 mLs (20 g total) by mouth 2 (two) times daily as needed for severe constipation. 12/26/15   Vena Austria, MD  nitrofurantoin, macrocrystal-monohydrate, (MACROBID) 100 MG capsule Take 1 capsule (100 mg total) by mouth 2 (two) times daily. 02/23/16 02/28/16  Nita Sickle, MD  polyethylene glycol powder (GLYCOLAX/MIRALAX) powder Take 1 Container by mouth once.    Historical Provider, MD  promethazine (PHENERGAN) 25 MG tablet Take 1 tablet (25 mg total) by mouth every 6 (six) hours as needed for nausea or vomiting. 11/13/15   Joni Reining, PA-C  QUEtiapine (SEROQUEL) 25 MG tablet Take 1 tablet (25 mg total) by mouth at bedtime. 11/19/15   Jimmy Footman, MD  ranitidine (ZANTAC) 150 MG tablet Take 1 tablet (150 mg total) by mouth 2 (two) times daily. 12/19/15 12/18/16  Phineas Semen, MD  sertraline (ZOLOFT) 50 MG tablet Take 1 tablet (50 mg total) by mouth daily. 11/19/15   Jimmy FootmanAndrea Hernandez-Gonzalez, MD  sucralfate (CARAFATE) 1 g tablet Take 1 tablet (1 g total) by mouth 4 (four) times daily. 12/19/15   Phineas SemenGraydon Goodman, MD    Allergies Penicillins; Rocephin [ceftriaxone]; and Lidocaine  Family History  Problem Relation Age of Onset  . Diabetes Mother   . Diabetes Maternal Grandmother   . Heart disease Maternal Grandmother   .  Cancer Neg Hx   . Ovarian cancer Neg Hx     Social History Social History  Substance Use Topics  . Smoking status: Former Smoker    Types: Cigarettes    Quit date: 08/13/2015  . Smokeless tobacco: Never Used  . Alcohol use No    Review of Systems  Constitutional: + fever. Eyes: Negative for visual changes. ENT: +sore throat, congestion Neck: No neck pain  Cardiovascular: Negative for chest pain. Respiratory: Negative for shortness of breath. + cough Gastrointestinal: Negative for abdominal pain, vomiting or diarrhea. Genitourinary: Negative for dysuria. Musculoskeletal: Negative for back pain. + left hip pain Skin: Negative for rash. Neurological: Negative for headaches, weakness or numbness. Psych: No SI or HI  ____________________________________________   PHYSICAL EXAM:  VITAL SIGNS: ED Triage Vitals [02/23/16 1431]  Enc Vitals Group     BP 120/70     Pulse Rate (!) 108     Resp 18     Temp 97.9 F (36.6 C)     Temp Source Oral     SpO2 100 %     Weight 180 lb (81.6 kg)     Height 5\' 2"  (1.575 m)     Head Circumference      Peak Flow      Pain Score 7     Pain Loc      Pain Edu?      Excl. in GC?     Constitutional: Alert and oriented. Well appearing and in no apparent distress. HEENT:      Head: Normocephalic and atraumatic.         Eyes: Conjunctivae are normal. Sclera is non-icteric. EOMI. PERRL      Mouth/Throat: Mucous membranes are moist.       Neck: Supple with no signs of meningismus. Cardiovascular: Regular rate and rhythm. No murmurs, gallops, or rubs. 2+ symmetrical distal pulses are present in all extremities. No JVD. Respiratory: Normal respiratory effort. Lungs are clear to auscultation bilaterally. No wheezes, crackles, or rhonchi.  Gastrointestinal: Soft, non tender, and non distended with positive bowel sounds. No rebound or guarding. Genitourinary: No CVA tenderness. Musculoskeletal: ttp over the l;ateral aspect of the L hip, no  deformities, painful ROM with internal and external rotation. Nontender with normal range of motion in all other extremities. No edema, cyanosis, or erythema of extremities. Neurologic: Normal speech and language. Face is symmetric. Moving all extremities. No gross focal neurologic deficits are appreciated. Skin: Skin is warm, dry and intact. No rash noted. Psychiatric: Mood and affect are normal. Speech and behavior are normal.  ____________________________________________   LABS (all labs ordered are listed, but only abnormal results are displayed)  Labs Reviewed  CBC WITH DIFFERENTIAL/PLATELET - Abnormal; Notable for the following:       Result Value   RBC 3.79 (*)    Hemoglobin 10.1 (*)    HCT 30.5 (*)    RDW 16.8 (*)    All other components within normal limits  BASIC METABOLIC PANEL - Abnormal;  Notable for the following:    CO2 21 (*)    Glucose, Bld 107 (*)    Calcium 8.8 (*)    All other components within normal limits  URINALYSIS, COMPLETE (UACMP) WITH MICROSCOPIC - Abnormal; Notable for the following:    Color, Urine YELLOW (*)    APPearance HAZY (*)    Protein, ur 30 (*)    Leukocytes, UA TRACE (*)    Bacteria, UA RARE (*)    Squamous Epithelial / LPF 6-30 (*)    All other components within normal limits  CULTURE, GROUP A STREP (THRC)  INFLUENZA PANEL BY PCR (TYPE A & B, H1N1)   ____________________________________________  EKG  none ____________________________________________  RADIOLOGY  CXR: Negative  L hip XR: negative ____________________________________________   PROCEDURES  Procedure(s) performed: None Procedures Critical Care performed:  None ____________________________________________   INITIAL IMPRESSION / ASSESSMENT AND PLAN / ED COURSE   20 y.o. female currently at 7229 weeks GA who presents for evaluation of cough, congestion, sore throat and fever x 3 days. No wheezing or shortness of breath, lungs are clear, vitals are within normal  limits. Fetal heart tones are normal. We'll do flu swab and a chest x-ray to rule out pneumonia or influenza. We'll check basic blood work. We'll check a strep swab. Patient will most likely benefit from Afrin on discharge. We'll also perform an x-ray of her left hip. Discussed risks and benefits of radiation exposure of the fetus versus diagnosis of a fracture. I have very low suspicion the patient has a fracture however she is very concerned and is requesting an x-ray.  Clinical Course as of Feb 22 1834  Wynelle LinkSun Feb 23, 2016  [id number] Chest x-ray with no evidence of pneumonia, strep negative, flu negative, x-ray of her hip with no acute fractures. Patient reports she feels markedly improved after a gram of Tylenol. White count is within normal limits and patient remains afebrile in the emergency department. UA concerning for bacteriuria we'll treat with macrobid. Recommended close f/u with PCP of ObGYN in 2 days for re-eval.  [CV]    Clinical Course User Index [CV] Nita Sicklearolina Maxie Slovacek, MD    Pertinent labs & imaging results that were available during my care of the patient were reviewed by me and considered in my medical decision making (see chart for details).    ____________________________________________   FINAL CLINICAL IMPRESSION(S) / ED DIAGNOSES  Final diagnoses:  Viral URI with cough  Left hip pain  Fall, initial encounter  Bacteriuria during pregnancy      NEW MEDICATIONS STARTED DURING THIS VISIT:  New Prescriptions   NITROFURANTOIN, MACROCRYSTAL-MONOHYDRATE, (MACROBID) 100 MG CAPSULE    Take 1 capsule (100 mg total) by mouth 2 (two) times daily.     Note:  This document was prepared using Dragon voice recognition software and may include unintentional dictation errors.    Nita Sicklearolina Taleah Bellantoni, MD 02/23/16 [phone]

## 2016-02-23 NOTE — ED Notes (Signed)
NAD noted at time of D/C. Pt denies questions or concerns. Pt ambulatory to the lobby at this time.  

## 2016-02-23 NOTE — ED Triage Notes (Addendum)
Pt c/o cough with chest and nasal congestion for the past 3 days, pt also states she was assisting her mother today and they fell injurying her left hip.. Pt ambulatory to triage .Marland Kitchen.pt is [redacted] weeks pregnant

## 2016-02-23 NOTE — ED Notes (Signed)
POC RAPID STREP NEGATIVE.

## 2016-02-25 ENCOUNTER — Emergency Department
Admission: EM | Admit: 2016-02-25 | Discharge: 2016-02-25 | Disposition: A | Discharge status: Home / Self Care | Payer: Medicaid Other | Attending: Emergency Medicine | Admitting: Emergency Medicine

## 2016-02-25 ENCOUNTER — Encounter: Payer: Self-pay | Admitting: Emergency Medicine

## 2016-02-25 DIAGNOSIS — J45909 Unspecified asthma, uncomplicated: Secondary | ICD-10-CM | POA: Insufficient documentation

## 2016-02-25 DIAGNOSIS — M545 Low back pain: Secondary | ICD-10-CM | POA: Diagnosis present

## 2016-02-25 DIAGNOSIS — Z79899 Other long term (current) drug therapy: Secondary | ICD-10-CM | POA: Insufficient documentation

## 2016-02-25 DIAGNOSIS — M5432 Sciatica, left side: Secondary | ICD-10-CM

## 2016-02-25 DIAGNOSIS — Z87891 Personal history of nicotine dependence: Secondary | ICD-10-CM | POA: Diagnosis not present

## 2016-02-25 DIAGNOSIS — M5442 Lumbago with sciatica, left side: Secondary | ICD-10-CM | POA: Diagnosis not present

## 2016-02-25 NOTE — ED Provider Notes (Signed)
[facility] Emergency Department Provider Note  ____________________________________________  Time seen: Approximately 7:59 PM  I have reviewed the triage vital signs and the nursing notes.   HISTORY  Chief Complaint Hip Pain    HPI Kristy Reid is a 20 y.o. female who presents emergency department complaining of lower back pain radiating into left hip. Patient states that she has had intermittent lower back pain with radiation into her left leg throughout her pregnancy. Patient fell 2 days ago and was evaluated emergency department for fall with no evidence of fracture. Patient states that she is now having pain shooting from her lower back and to her left leg. She denies it as a sharp/burning sensation. She states that she was taking Tylenol but "it didn't fix it after I taken it twice" so she stopped taking the Tylenol. Patient has had no other medications for this complaint. She is requesting that we give her "something to fix this." Patient denies any bowel or bladder dysfunction, saddle anesthesia, paresthesias. She denies any vaginal discharge or bleeding. She denies any abdominal pain.   Past Medical History:  Diagnosis Date  . Anemia   . Anemia   . Anxiety   . Asthma   . Chronic back pain   . Depression   . Insomnia   . PTSD (post-traumatic stress disorder)     Patient Active Problem List   Diagnosis Date Noted  . Abdominal pain in pregnancy 01/21/2016  . Constipation during pregnancy in second trimester 12/26/2015  . Overdose 11/29/2015  . Pregnancy (I trimester) 11/15/2015  . Cannabis use disorder, severe, dependence (HCC) 11/15/2015  . Tobacco use disorder 11/15/2015  . Asthma 11/15/2015  . Borderline personality disorder 11/15/2015  . PTSD (post-traumatic stress disorder) 11/15/2015  . Severe recurrent major depression without psychotic features (HCC) 11/13/2015    Past Surgical History:  Procedure Laterality Date  . BUNIONECTOMY  Bilateral    feet  . wrist sugery      Prior to Admission medications   Medication Sig Start Date End Date Taking? Authorizing Provider  acetaminophen (TYLENOL) 325 MG tablet Take 650 mg by mouth every 6 (six) hours as needed for mild pain or headache.    Historical Provider, MD  albuterol (PROVENTIL HFA;VENTOLIN HFA) 108 (90 BASE) MCG/ACT inhaler Inhale 2 puffs into the lungs every 6 (six) hours as needed for wheezing or shortness of breath. 01/06/15   Sharyn Creamer, MD  beclomethasone (QVAR) 40 MCG/ACT inhaler Inhale 1 puff into the lungs 2 (two) times daily.    Historical Provider, MD  lactulose (CHRONULAC) 10 GM/15ML solution Take 30 mLs (20 g total) by mouth 2 (two) times daily as needed for severe constipation. 12/26/15   Vena Austria, MD  nitrofurantoin, macrocrystal-monohydrate, (MACROBID) 100 MG capsule Take 1 capsule (100 mg total) by mouth 2 (two) times daily. 02/23/16 02/28/16  Nita Sickle, MD  oxymetazoline (AFRIN) 0.05 % nasal spray Place 2 sprays into both nostrils 2 (two) times daily as needed for congestion. 02/23/16 02/22/17  Nita Sickle, MD  polyethylene glycol powder (GLYCOLAX/MIRALAX) powder Take 1 Container by mouth once.    Historical Provider, MD  promethazine (PHENERGAN) 25 MG tablet Take 1 tablet (25 mg total) by mouth every 6 (six) hours as needed for nausea or vomiting. 11/13/15   Joni Reining, PA-C  QUEtiapine (SEROQUEL) 25 MG tablet Take 1 tablet (25 mg total) by mouth at bedtime. 11/19/15   Jimmy Footman, MD  ranitidine (ZANTAC) 150 MG tablet Take 1  tablet (150 mg total) by mouth 2 (two) times daily. 12/19/15 12/18/16  Phineas SemenGraydon Goodman, MD  sertraline (ZOLOFT) 50 MG tablet Take 1 tablet (50 mg total) by mouth daily. 11/19/15   Jimmy FootmanAndrea Hernandez-Gonzalez, MD  sucralfate (CARAFATE) 1 g tablet Take 1 tablet (1 g total) by mouth 4 (four) times daily. 12/19/15   Phineas SemenGraydon Goodman, MD    Allergies Penicillins; Rocephin [ceftriaxone]; and  Lidocaine  Family History  Problem Relation Age of Onset  . Diabetes Mother   . Diabetes Maternal Grandmother   . Heart disease Maternal Grandmother   . Cancer Neg Hx   . Ovarian cancer Neg Hx     Social History Social History  Substance Use Topics  . Smoking status: Former Smoker    Types: Cigarettes    Quit date: 08/13/2015  . Smokeless tobacco: Never Used  . Alcohol use No     Review of Systems  Constitutional: No fever/chills Cardiovascular: no chest pain. Respiratory: no cough. No SOB. Gastrointestinal: No abdominal pain.  No nausea, no vomiting.  Genitourinary: Negative for dysuria. No hematuria. No vaginal discharge or bleeding Musculoskeletal: Positive for lower back pain radiating into the left hip. Skin: Negative for rash, abrasions, lacerations, ecchymosis. Neurological: Negative for headaches, focal weakness or numbness. 10-point ROS otherwise negative.  ____________________________________________   PHYSICAL EXAM:  VITAL SIGNS: ED Triage Vitals [02/25/16 1853]  Enc Vitals Group     BP 124/70     Pulse Rate 100     Resp 20     Temp 98.2 F (36.8 C)     Temp Source Oral     SpO2 100 %     Weight 180 lb (81.6 kg)     Height 5\' 2"  (1.575 m)     Head Circumference      Peak Flow      Pain Score 8     Pain Loc      Pain Edu?      Excl. in GC?      Constitutional: Alert and oriented. Well appearing and in no acute distress. Eyes: Conjunctivae are normal. PERRL. EOMI. Head: Atraumatic. Neck: No stridor.    Cardiovascular: Normal rate, regular rhythm. Normal S1 and S2.  Good peripheral circulation. Respiratory: Normal respiratory effort without tachypnea or retractions. Lungs CTAB. Good air entry to the bases with no decreased or absent breath sounds. Musculoskeletal: Full range of motion to all extremities. No gross deformities appreciated. No deformities noted to spine protection. Full range of motion to spine. No visible deformities noted to left  hip upon inspection. No ecchymosis, contusions, abrasions. Patient is tender to palpation in the sciatic nerve distribution. Patient is tender palpation of the left sciatic notch. Dorsalis pedis pulse intact distally. Sensation intact and equal to unaffected extremity. Neurologic:  Normal speech and language. No gross focal neurologic deficits are appreciated.  Skin:  Skin is warm, dry and intact. No rash noted. Psychiatric: Mood and affect are normal. Speech and behavior are normal. Patient exhibits appropriate insight and judgement.   ____________________________________________   LABS (all labs ordered are listed, but only abnormal results are displayed)  Labs Reviewed - No data to display ____________________________________________  EKG   ____________________________________________  RADIOLOGY   No results found.  ____________________________________________    PROCEDURES  Procedure(s) performed:    Procedures    Medications - No data to display   ____________________________________________   INITIAL IMPRESSION / ASSESSMENT AND PLAN / ED COURSE  Pertinent labs & imaging results that were  available during my care of the patient were reviewed by me and considered in my medical decision making (see chart for details).  Review of the  [place] was performed in accordance of the [facility] prior to dispensing any controlled drugs.  Clinical Course     Patient's diagnosis is consistent with Sciatica to the left side. Patient has had intermittent pains consistent with sciatica plus she had injury to area 2 days prior. Patient was evaluated in this emergency department with imaging and labs with no acute findings. Patient returns to emergency department for continued pain. She states that she has taken 2 doses of Tylenol and that it is not "fix it" she is here so that today "eating give me something to fix this." Patient is advised that she can continue to take Tylenol for  this condition. No new prescriptions at this time. Due to reassuring imaging and labs 2 days prior, no new imaging or labs aren't deemed necessary at this time. Patient may follow-up with OB/GYN as necessary..  Patient is given ED precautions to return to the ED for any worsening or new symptoms.     ____________________________________________  FINAL CLINICAL IMPRESSION(S) / ED DIAGNOSES  Final diagnoses:  Sciatica of left side      NEW MEDICATIONS STARTED DURING THIS VISIT:  New Prescriptions   No medications on file        This chart was dictated using voice recognition software/Dragon. Despite best efforts to proofread, errors can occur which can change the meaning. Any change was purely unintentional.    Racheal PatchesJonathan D Mirka Barbone, PA-C 02/25/16 2016    Nita Sicklearolina Veronese, MD 02/26/16 [id number]

## 2016-02-25 NOTE — ED Notes (Signed)
Electronic signature pad not working at this time. Patient given discharge instructions. All questions answered. Patient verbalized understanding.

## 2016-02-25 NOTE — ED Triage Notes (Signed)
Pt to ed with c/o left hip pain x 2 days worse today,  Radiates down left leg.

## 2016-02-26 LAB — CULTURE, GROUP A STREP (THRC)

## 2016-03-16 NOTE — L&D Delivery Note (Signed)
Delivery Note Primary OB: [facility] Delivery Physician: Annamarie MajorPaul Aviv Lengacher, MD Gestational Age: Full term Antepartum complications: none Intrapartum complications: None  A viable Female was delivered via vertex perentation.  Apgars:7 ,9  Weight:  6 lb 10 oz .   Placenta status: spontaneous and Intact.  Cord: 3+ vessels;  with the following complications: none.  Anesthesia:  none Episiotomy:  none Lacerations:  none Suture Repair: none Est. Blood Loss (mL):  less than 100 mL  Mom to postpartum.  Baby to Couplet care / Skin to Skin.  Annamarie MajorPaul Shivansh Hardaway, MD Dept of OB/GYN [phone]

## 2016-03-22 ENCOUNTER — Observation Stay
Admission: EM | Admit: 2016-03-22 | Discharge: 2016-03-22 | Disposition: A | Discharge status: Home / Self Care | Payer: Medicaid Other | Attending: Obstetrics and Gynecology | Admitting: Obstetrics and Gynecology

## 2016-03-22 DIAGNOSIS — M549 Dorsalgia, unspecified: Secondary | ICD-10-CM | POA: Diagnosis not present

## 2016-03-22 DIAGNOSIS — G8929 Other chronic pain: Secondary | ICD-10-CM | POA: Insufficient documentation

## 2016-03-22 DIAGNOSIS — Z3A33 33 weeks gestation of pregnancy: Secondary | ICD-10-CM | POA: Insufficient documentation

## 2016-03-22 DIAGNOSIS — O368130 Decreased fetal movements, third trimester, not applicable or unspecified: Secondary | ICD-10-CM | POA: Diagnosis not present

## 2016-03-22 DIAGNOSIS — O368190 Decreased fetal movements, unspecified trimester, not applicable or unspecified: Secondary | ICD-10-CM | POA: Diagnosis present

## 2016-03-22 DIAGNOSIS — Z880 Allergy status to penicillin: Secondary | ICD-10-CM | POA: Diagnosis not present

## 2016-03-22 LAB — URINALYSIS, COMPLETE (UACMP) WITH MICROSCOPIC
BILIRUBIN URINE: NEGATIVE
Glucose, UA: NEGATIVE mg/dL
Hgb urine dipstick: NEGATIVE
Ketones, ur: NEGATIVE mg/dL
LEUKOCYTES UA: NEGATIVE
NITRITE: NEGATIVE
PROTEIN: NEGATIVE mg/dL
Specific Gravity, Urine: 1.016 (ref 1.005–1.030)
pH: 7 (ref 5.0–8.0)

## 2016-03-22 MED ORDER — NITROFURANTOIN MONOHYD MACRO 100 MG PO CAPS: 100.0000 mg | ORAL_CAPSULE | Freq: Two times a day (BID) | ORAL | 1 refills | Status: DC

## 2016-03-22 NOTE — Final Progress Note (Signed)
Physician Final Progress Note  Patient ID: Kristy Reid MRN: [id number] DOB/AGE: 1995/10/04 20 y.o.  Admit date: 03/22/2016 Admitting provider: Vena Austria, MD Discharge date: 03/22/2016   Admission Diagnoses: Decreased fetal movement  Discharge Diagnoses:  Active Problems:   Decreased fetal movement  21 yo G3P1 at [redacted]w[redacted]d presenting with chronic back pain and decreased fetal movement.  Back pain is thoracic, no CVA, no dysuria but UA with rare bacteria.  Has chronic back pain.  The NST is reactive today  Consults: None  Significant Findings/ Diagnostic Studies:  Results for orders placed or performed during the hospital encounter of 03/22/16 (from the past 24 hour(s))  Urinalysis, Complete w Microscopic     Status: Abnormal   Collection Time: 03/22/16 11:15 PM  Result Value Ref Range   Color, Urine YELLOW (A) YELLOW   APPearance CLEAR (A) CLEAR   Specific Gravity, Urine 1.016 1.005 - 1.030   pH 7.0 5.0 - 8.0   Glucose, UA NEGATIVE NEGATIVE mg/dL   Hgb urine dipstick NEGATIVE NEGATIVE   Bilirubin Urine NEGATIVE NEGATIVE   Ketones, ur NEGATIVE NEGATIVE mg/dL   Protein, ur NEGATIVE NEGATIVE mg/dL   Nitrite NEGATIVE NEGATIVE   Leukocytes, UA NEGATIVE NEGATIVE   RBC / HPF 0-5 0 - 5 RBC/hpf   WBC, UA 0-5 0 - 5 WBC/hpf   Bacteria, UA RARE (A) NONE SEEN   Squamous Epithelial / LPF 0-5 (A) NONE SEEN     Procedures: NST 130, moderate, +accels, no decels.  No contractions  Discharge Condition: good  Disposition: 01-Home or Self Care  Diet: Regular diet  Discharge Activity: Activity as tolerated  Discharge Instructions    Discharge activity:  No Restrictions    Complete by:  As directed    Fetal Kick Count:  Lie on our left side for one hour after a meal, and count the number of times your baby kicks.  If it is less than 5 times, get up, move around and drink some juice.  Repeat the test 30 minutes later.  If it is still less than 5 kicks in an hour, notify your doctor.     Complete by:  As directed    No sexual activity restrictions    Complete by:  As directed    Notify physician for a general feeling that "something is not right"    Complete by:  As directed    Notify physician for increase or change in vaginal discharge    Complete by:  As directed    Notify physician for intestinal cramps, with or without diarrhea, sometimes described as "gas pain"    Complete by:  As directed    Notify physician for leaking of fluid    Complete by:  As directed    Notify physician for low, dull backache, unrelieved by heat or Tylenol    Complete by:  As directed    Notify physician for menstrual like cramps    Complete by:  As directed    Notify physician for pelvic pressure    Complete by:  As directed    Notify physician for uterine contractions.  These may be painless and feel like the uterus is tightening or the baby is  "balling up"    Complete by:  As directed    Notify physician for vaginal bleeding    Complete by:  As directed    PRETERM LABOR:  Includes any of the follwing symptoms that occur between 20 - [redacted] weeks gestation.  If  these symptoms are not stopped, preterm labor can result in preterm delivery, placing your baby at risk    Complete by:  As directed      Allergies as of 03/22/2016      Reactions   Penicillins Hives   Has patient had a PCN reaction causing immediate rash, facial/tongue/throat swelling, SOB or lightheadedness with hypotension: No Has patient had a PCN reaction causing severe rash involving mucus membranes or skin necrosis: No Has patient had a PCN reaction that required hospitalization No Has patient had a PCN reaction occurring within the last 10 years: No If all of the above answers are "NO", then may proceed with Cephalosporin use.   Rocephin [ceftriaxone] Hives   Lidocaine Hives, Itching, Rash      Medication List    STOP taking these medications   acetaminophen 325 MG tablet Commonly known as:  TYLENOL   albuterol  108 (90 Base) MCG/ACT inhaler Commonly known as:  PROVENTIL HFA;VENTOLIN HFA   beclomethasone 40 MCG/ACT inhaler Commonly known as:  QVAR   lactulose 10 GM/15ML solution Commonly known as:  CHRONULAC   oxymetazoline 0.05 % nasal spray Commonly known as:  AFRIN   polyethylene glycol powder powder Commonly known as:  GLYCOLAX/MIRALAX   promethazine 25 MG tablet Commonly known as:  PHENERGAN   QUEtiapine 25 MG tablet Commonly known as:  SEROQUEL   ranitidine 150 MG tablet Commonly known as:  ZANTAC   sertraline 50 MG tablet Commonly known as:  ZOLOFT   sucralfate 1 g tablet Commonly known as:  CARAFATE     TAKE these medications   nitrofurantoin (macrocrystal-monohydrate) 100 MG capsule Commonly known as:  MACROBID Take 1 capsule (100 mg total) by mouth 2 (two) times daily.        Total time spent taking care of this patient: 30 minutes  Signed: [name], Kristy Reid M 03/22/2016, 11:55 PM

## 2016-04-06 ENCOUNTER — Encounter: Payer: Self-pay | Admitting: *Deleted

## 2016-04-06 ENCOUNTER — Observation Stay
Admission: EM | Admit: 2016-04-06 | Discharge: 2016-04-06 | Disposition: A | Discharge status: Home / Self Care | Payer: Medicaid Other | Attending: Obstetrics and Gynecology | Admitting: Obstetrics and Gynecology

## 2016-04-06 DIAGNOSIS — O26893 Other specified pregnancy related conditions, third trimester: Principal | ICD-10-CM | POA: Insufficient documentation

## 2016-04-06 DIAGNOSIS — M25552 Pain in left hip: Secondary | ICD-10-CM | POA: Diagnosis not present

## 2016-04-06 DIAGNOSIS — Z888 Allergy status to other drugs, medicaments and biological substances status: Secondary | ICD-10-CM | POA: Insufficient documentation

## 2016-04-06 DIAGNOSIS — J45909 Unspecified asthma, uncomplicated: Secondary | ICD-10-CM | POA: Diagnosis not present

## 2016-04-06 DIAGNOSIS — Z79899 Other long term (current) drug therapy: Secondary | ICD-10-CM | POA: Diagnosis not present

## 2016-04-06 DIAGNOSIS — Z880 Allergy status to penicillin: Secondary | ICD-10-CM | POA: Diagnosis not present

## 2016-04-06 DIAGNOSIS — Z3A35 35 weeks gestation of pregnancy: Secondary | ICD-10-CM | POA: Diagnosis not present

## 2016-04-06 DIAGNOSIS — F4310 Post-traumatic stress disorder, unspecified: Secondary | ICD-10-CM | POA: Diagnosis not present

## 2016-04-06 DIAGNOSIS — W109XXA Fall (on) (from) unspecified stairs and steps, initial encounter: Secondary | ICD-10-CM | POA: Insufficient documentation

## 2016-04-06 DIAGNOSIS — Z87891 Personal history of nicotine dependence: Secondary | ICD-10-CM | POA: Diagnosis not present

## 2016-04-06 DIAGNOSIS — G4700 Insomnia, unspecified: Secondary | ICD-10-CM | POA: Insufficient documentation

## 2016-04-06 DIAGNOSIS — F419 Anxiety disorder, unspecified: Secondary | ICD-10-CM | POA: Diagnosis not present

## 2016-04-06 DIAGNOSIS — Z886 Allergy status to analgesic agent status: Secondary | ICD-10-CM | POA: Insufficient documentation

## 2016-04-06 DIAGNOSIS — G8929 Other chronic pain: Secondary | ICD-10-CM | POA: Diagnosis not present

## 2016-04-06 DIAGNOSIS — F329 Major depressive disorder, single episode, unspecified: Secondary | ICD-10-CM | POA: Insufficient documentation

## 2016-04-06 DIAGNOSIS — O26899 Other specified pregnancy related conditions, unspecified trimester: Secondary | ICD-10-CM | POA: Diagnosis present

## 2016-04-06 HISTORY — DX: Personality disorder, unspecified: F60.9

## 2016-04-06 MED ORDER — ONDANSETRON HCL 4 MG PO TABS
ORAL_TABLET | ORAL | Status: AC
  Administered 2016-04-06: 4 mg via ORAL
  Filled 2016-04-06: qty 1

## 2016-04-06 MED ORDER — ONDANSETRON HCL 4 MG PO TABS
4.0000 mg | ORAL_TABLET | Freq: Once | ORAL | Status: AC
  Administered 2016-04-06: 4 mg via ORAL

## 2016-04-06 NOTE — OB Triage Note (Signed)
Discharge instructions reviewed with patient. Patient verbalizes understanding, denies questions. Patient ambulatory off unit in stable condition in company of friend.

## 2016-04-06 NOTE — OB Triage Note (Signed)
Larey SeatFell down a flight of stairs today at approx 1330. Landed on left side of stomach and left hip. No bruising or guarding noted. Elaina HoopsElks, Legrande Hao S

## 2016-04-06 NOTE — Discharge Instructions (Signed)
Drink plenty of fluids, rest frequently.  °

## 2016-04-06 NOTE — Discharge Summary (Signed)
Physician Final Progress Note  Patient ID: Kristy Reid MRN: [id number] DOB/AGE: January 01, 1996 21 y.o.  Admit date: 04/06/2016 Admitting provider: Tresea Mall, CNM Discharge date: 04/06/2016   Admission Diagnoses: Pt states that at approximately 1:30 this afternoon she tripped over a cat on the landing of stairs and she tumbled down 10 stairs. She hit her left hip and left side of her belly. At the time she was unable to get herself to the hospital. She waited for her friend to get home and bring her here this evening. Pt took tylenol after the fall with some relief. She states the pain (5-6/10) she feels now is mainly in her left hip. She said she had not felt the baby move after falling and prior to arrival at the hospital, but she has been feeling movement since being here. Pt denies LOF/VB. She states that she has occasional contractions. At a recent prenatal visit pt had not yet taken her azithromycin for chlamydia infection. She told me tonight that she has taken the medication.   Discharge Diagnoses:  Active Problems:   Indication for care in labor and delivery, antepartum IUP at [redacted]w[redacted]d with reactive NST, not in labor   History of Present Illness: Patient was admitted for observation and placed on monitors.  Past Medical History:  Diagnosis Date  . Anemia   . Anemia   . Anxiety   . Asthma   . Chronic back pain   . Depression   . Insomnia   . Personality disorder    "boarderline"  . PTSD (post-traumatic stress disorder)     Past Surgical History:  Procedure Laterality Date  . BUNIONECTOMY Bilateral    feet  . wrist sugery      No current facility-administered medications on file prior to encounter.    Current Outpatient Prescriptions on File Prior to Encounter  Medication Sig Dispense Refill  . nitrofurantoin, macrocrystal-monohydrate, (MACROBID) 100 MG capsule Take 1 capsule (100 mg total) by mouth 2 (two) times daily. (Patient not taking: Reported on 04/06/2016) 14  capsule 1    Allergies  Allergen Reactions  . Penicillins Hives    Has patient had a PCN reaction causing immediate rash, facial/tongue/throat swelling, SOB or lightheadedness with hypotension: No Has patient had a PCN reaction causing severe rash involving mucus membranes or skin necrosis: No Has patient had a PCN reaction that required hospitalization No Has patient had a PCN reaction occurring within the last 10 years: No If all of the above answers are "NO", then may proceed with Cephalosporin use.  . Rocephin [Ceftriaxone] Hives  . Lidocaine Hives, Itching and Rash    Social History   Social History  . Marital status: Single    Spouse name: N/A  . Number of children: N/A  . Years of education: N/A   Occupational History  . Not on file.   Social History Main Topics  . Smoking status: Former Smoker    Types: Cigarettes    Quit date: 08/13/2015  . Smokeless tobacco: Never Used  . Alcohol use No  . Drug use: Yes    Frequency: 1.0 time per week    Types: Marijuana     Comment: last 2 days ago  . Sexual activity: Yes    Birth control/ protection: Injection   Other Topics Concern  . Not on file   Social History Narrative  . No narrative on file    Physical Exam: BP 115/70   Pulse (!) 106   Temp 98.1  F (36.7 C) (Oral)   Resp 18   Ht 5\' 2"  (1.575 m)   Wt 190 lb (86.2 kg)   LMP 08/03/2015 Comment: positive pregnancy test prior to imaging  BMI 34.75 kg/m   Gen: NAD HEENT: normal CV: RRR Resp: CTAB Abdomen: soft, non tender, no evidence of bruising Pelvic: deferred Toco: negative Fetal Well Being: 130 bpm, moderate variability, +accelerations, -decelerations Ext: no evidence of DVT Psych: normal affect  Consults: None  Significant Findings/ Diagnostic Studies: none  Procedures: NST  Discharge Condition: good  Disposition: 01-Home or Self Care  Diet: Regular diet  Discharge Activity: Activity as tolerated  Discharge Instructions    Discharge  activity:  No Restrictions    Complete by:  As directed    Discharge diet:  No restrictions    Complete by:  As directed    Fetal Kick Count:  Lie on our left side for one hour after a meal, and count the number of times your baby kicks.  If it is less than 5 times, get up, move around and drink some juice.  Repeat the test 30 minutes later.  If it is still less than 5 kicks in an hour, notify your doctor.    Complete by:  As directed    No sexual activity restrictions    Complete by:  As directed    Notify physician for a general feeling that "something is not right"    Complete by:  As directed    Notify physician for increase or change in vaginal discharge    Complete by:  As directed    Notify physician for intestinal cramps, with or without diarrhea, sometimes described as "gas pain"    Complete by:  As directed    Notify physician for leaking of fluid    Complete by:  As directed    Notify physician for low, dull backache, unrelieved by heat or Tylenol    Complete by:  As directed    Notify physician for menstrual like cramps    Complete by:  As directed    Notify physician for pelvic pressure    Complete by:  As directed    Notify physician for uterine contractions.  These may be painless and feel like the uterus is tightening or the baby is  "balling up"    Complete by:  As directed    Notify physician for vaginal bleeding    Complete by:  As directed    PRETERM LABOR:  Includes any of the follwing symptoms that occur between 20 - [redacted] weeks gestation.  If these symptoms are not stopped, preterm labor can result in preterm delivery, placing your baby at risk    Complete by:  As directed      Allergies as of 04/06/2016      Reactions   Penicillins Hives   Has patient had a PCN reaction causing immediate rash, facial/tongue/throat swelling, SOB or lightheadedness with hypotension: No Has patient had a PCN reaction causing severe rash involving mucus membranes or skin necrosis:  No Has patient had a PCN reaction that required hospitalization No Has patient had a PCN reaction occurring within the last 10 years: No If all of the above answers are "NO", then may proceed with Cephalosporin use.   Rocephin [ceftriaxone] Hives   Lidocaine Hives, Itching, Rash      Medication List    STOP taking these medications   nitrofurantoin (macrocrystal-monohydrate) 100 MG capsule Commonly known as:  MACROBID  TAKE these medications   albuterol (5 MG/ML) 0.5% nebulizer solution Commonly known as:  PROVENTIL Take 2.5 mg by nebulization every 6 (six) hours as needed for wheezing or shortness of breath.   fluticasone 50 MCG/ACT nasal spray Commonly known as:  FLONASE Place 2 sprays into both nostrils 2 (two) times daily.   multivitamin-prenatal 27-0.8 MG Tabs tablet Take 1 tablet by mouth daily at 12 noon.   promethazine 12.5 MG tablet Commonly known as:  PHENERGAN Take 12.5 mg by mouth every 6 (six) hours as needed for nausea.   SEROQUEL 50 MG tablet Generic drug:  QUEtiapine Take 50 mg by mouth at bedtime.   Take Tylenol 650 mg every 6 hours as needed for pain  Use heat/ice as needed for pain   Follow-up Information    [facility] Follow up.   Why:  go to your regular scheduled prenatal appointment Contact information: [address] [place] [address] [phone]          Total time spent taking care of this patient: 25 minutes  Signed: Obie Dredge  04/06/2016, 8:31 PM

## 2016-04-10 ENCOUNTER — Encounter: Payer: Self-pay | Admitting: Emergency Medicine

## 2016-04-10 ENCOUNTER — Emergency Department
Admission: EM | Admit: 2016-04-10 | Discharge: 2016-04-10 | Disposition: A | Discharge status: Home / Self Care | Payer: Medicaid Other | Attending: Emergency Medicine | Admitting: Emergency Medicine

## 2016-04-10 DIAGNOSIS — Z87891 Personal history of nicotine dependence: Secondary | ICD-10-CM | POA: Insufficient documentation

## 2016-04-10 DIAGNOSIS — R112 Nausea with vomiting, unspecified: Secondary | ICD-10-CM

## 2016-04-10 DIAGNOSIS — O212 Late vomiting of pregnancy: Secondary | ICD-10-CM | POA: Diagnosis not present

## 2016-04-10 DIAGNOSIS — J45909 Unspecified asthma, uncomplicated: Secondary | ICD-10-CM | POA: Diagnosis not present

## 2016-04-10 DIAGNOSIS — Z79899 Other long term (current) drug therapy: Secondary | ICD-10-CM | POA: Diagnosis not present

## 2016-04-10 DIAGNOSIS — Z3A35 35 weeks gestation of pregnancy: Secondary | ICD-10-CM | POA: Insufficient documentation

## 2016-04-10 LAB — URINALYSIS, COMPLETE (UACMP) WITH MICROSCOPIC
BACTERIA UA: NONE SEEN
Bilirubin Urine: NEGATIVE
GLUCOSE, UA: NEGATIVE mg/dL
HGB URINE DIPSTICK: NEGATIVE
Ketones, ur: 5 mg/dL — AB
Nitrite: NEGATIVE
PROTEIN: 100 mg/dL — AB
Specific Gravity, Urine: 1.025 (ref 1.005–1.030)
pH: 6 (ref 5.0–8.0)

## 2016-04-10 LAB — LIPASE, BLOOD: LIPASE: 47 U/L (ref 11–51)

## 2016-04-10 LAB — CBC
HCT: 28.1 % — ABNORMAL LOW (ref 35.0–47.0)
HEMOGLOBIN: 9.5 g/dL — AB (ref 12.0–16.0)
MCH: 25.3 pg — AB (ref 26.0–34.0)
MCHC: 33.6 g/dL (ref 32.0–36.0)
MCV: 75.1 fL — ABNORMAL LOW (ref 80.0–100.0)
Platelets: 252 10*3/uL (ref 150–440)
RBC: 3.75 MIL/uL — AB (ref 3.80–5.20)
RDW: 17.3 % — ABNORMAL HIGH (ref 11.5–14.5)
WBC: 7.9 10*3/uL (ref 3.6–11.0)

## 2016-04-10 LAB — COMPREHENSIVE METABOLIC PANEL
ALBUMIN: 3 g/dL — AB (ref 3.5–5.0)
ALK PHOS: 86 U/L (ref 38–126)
ALT: 13 U/L — ABNORMAL LOW (ref 14–54)
ANION GAP: 9 (ref 5–15)
AST: 26 U/L (ref 15–41)
BUN: 5 mg/dL — ABNORMAL LOW (ref 6–20)
CHLORIDE: 109 mmol/L (ref 101–111)
CO2: 18 mmol/L — AB (ref 22–32)
Calcium: 8.7 mg/dL — ABNORMAL LOW (ref 8.9–10.3)
Creatinine, Ser: 0.75 mg/dL (ref 0.44–1.00)
GFR calc non Af Amer: >60 mL/min (ref >60)
GLUCOSE: 108 mg/dL — AB (ref 65–99)
POTASSIUM: 3.5 mmol/L (ref 3.5–5.1)
Sodium: 136 mmol/L (ref 135–145)
Total Bilirubin: 0.2 mg/dL — ABNORMAL LOW (ref 0.3–1.2)
Total Protein: 7.1 g/dL (ref 6.5–8.1)

## 2016-04-10 MED ORDER — ONDANSETRON HCL 4 MG/2ML IJ SOLN
4.0000 mg | Freq: Once | INTRAMUSCULAR | Status: AC
  Administered 2016-04-10: 4 mg via INTRAVENOUS
  Filled 2016-04-10: qty 2

## 2016-04-10 MED ORDER — SODIUM CHLORIDE 0.9 % IV BOLUS (SEPSIS)
1000.0000 mL | Freq: Once | INTRAVENOUS | Status: AC
  Administered 2016-04-10: 1000 mL via INTRAVENOUS

## 2016-04-10 MED ORDER — PROMETHAZINE HCL 12.5 MG RE SUPP: 12.5000 mg | Freq: Four times a day (QID) | RECTAL | 0 refills | Status: DC | PRN

## 2016-04-10 MED ORDER — ACETAMINOPHEN 325 MG PO TABS
650.0000 mg | ORAL_TABLET | Freq: Once | ORAL | Status: DC
  Filled 2016-04-10: qty 2

## 2016-04-10 NOTE — ED Provider Notes (Signed)
Time Seen: Approximately 1504  I have reviewed the triage notes  Chief Complaint: Emesis; Diarrhea; and Nausea   History of Present Illness: Kristy Reid is a 21 y.o. female who is approximately [redacted] weeks pregnant. She is overall gravida 3 para 1 with one miscarriage. Patient has a history of anemia but otherwise no other complications from her pregnancy. She denies any unusual vaginal discharge or bleeding. She states she's had vomiting throughout her pregnancy and has Phenergan tablets at home. She states over the last 48 hours she hasn't been able to keep the Phenergan tablets down. She's had some loose stool without any melena or hematochezia. She denies any recent travel or antibiotic therapy. She states she has felt some fetal movements up until the time of her visit here today. Patient's relatively frequent visitor here to the emergency department. She denies Any fever or chills at home.   Past Medical History:  Diagnosis Date  . Anemia   . Anemia   . Anxiety   . Asthma   . Chronic back pain   . Depression   . Insomnia   . Personality disorder    "boarderline"  . PTSD (post-traumatic stress disorder)     Patient Active Problem List   Diagnosis Date Noted  . Indication for care in labor and delivery, antepartum 04/06/2016  . Decreased fetal movement 03/22/2016  . Abdominal pain in pregnancy 01/21/2016  . Constipation during pregnancy in second trimester 12/26/2015  . Overdose 11/29/2015  . Pregnancy (I trimester) 11/15/2015  . Cannabis use disorder, severe, dependence (HCC) 11/15/2015  . Tobacco use disorder 11/15/2015  . Asthma 11/15/2015  . Borderline personality disorder 11/15/2015  . PTSD (post-traumatic stress disorder) 11/15/2015  . Severe recurrent major depression without psychotic features (HCC) 11/13/2015    Past Surgical History:  Procedure Laterality Date  . BUNIONECTOMY Bilateral    feet  . wrist sugery      Past Surgical History:  Procedure  Laterality Date  . BUNIONECTOMY Bilateral    feet  . wrist sugery      Current Outpatient Rx  . Order #: [id number] Class: Historical Med  . Order #: [id number] Class: Historical Med  . Order #: [id number] Class: Historical Med  . Order #: [id number] Class: Historical Med  . Order #: [id number] Class: Historical Med    Allergies:  Penicillins; Rocephin [ceftriaxone]; and Lidocaine  Family History: Family History  Problem Relation Age of Onset  . Diabetes Mother   . Diabetes Maternal Grandmother   . Heart disease Maternal Grandmother   . Cancer Neg Hx   . Ovarian cancer Neg Hx     Social History: Social History  Substance Use Topics  . Smoking status: Former Smoker    Types: Cigarettes    Quit date: 08/13/2015  . Smokeless tobacco: Never Used  . Alcohol use No     Review of Systems:   10 point review of systems was performed and was otherwise negative:  Constitutional: No fever Eyes: No visual disturbances ENT: No sore throat, ear pain Cardiac: No chest pain Respiratory: No shortness of breath, wheezing, or stridor Abdomen: No focal abdominal pain, no vomiting, No diarrhea Endocrine: No weight loss, No night sweats Extremities: No peripheral edema, cyanosis Skin: No rashes, easy bruising Neurologic: No focal weakness, trouble with speech or swollowing Urologic: No dysuria, Hematuria, or urinary frequency   Physical Exam:  ED Triage Vitals  Enc Vitals Group     BP 04/10/16 1118 110/69     Pulse Rate  04/10/16 1118 (!) 124     Resp 04/10/16 1118 16     Temp 04/10/16 1118 98.5 F (36.9 C)     Temp Source 04/10/16 1118 Oral     SpO2 04/10/16 1118 100 %     Weight 04/10/16 1117 190 lb (86.2 kg)     Height 04/10/16 1117 5\' 2"  (1.575 m)     Head Circumference --      Peak Flow --      Pain Score 04/10/16 1117 8     Pain Loc --      Pain Edu? --      Excl. in GC? --     General: Awake , Alert , and Oriented times 3; GCS 15 Head: Normal cephalic ,  atraumatic Eyes: Pupils equal , round, reactive to light Nose/Throat: No nasal drainage, patent upper airway without erythema or exudate.  Neck: Supple, Full range of motion, No anterior adenopathy or palpable thyroid masses Lungs: Clear to ascultation without wheezes , rhonchi, or rales Heart: Regular rate, regular rhythm without murmurs , gallops , or rubs Abdomen: Clear. Maneuvers come out to approximately 10 cm above the umbilicus Soft, non tender without rebound, guarding , or rigidity; bowel sounds positive and symmetric in all 4 quadrants. No organomegaly .        Extremities: 2 plus symmetric pulses. No edema, clubbing or cyanosis Neurologic: normal ambulation, Motor symmetric without deficits, sensory intact Skin: warm, dry, no rashes   Labs:   All laboratory work was reviewed including any pertinent negatives or positives listed below:  Labs Reviewed  COMPREHENSIVE METABOLIC PANEL - Abnormal; Notable for the following:       Result Value   CO2 18 (*)    Glucose, Bld 108 (*)    BUN 5 (*)    Calcium 8.7 (*)    Albumin 3.0 (*)    ALT 13 (*)    Total Bilirubin 0.2 (*)    All other components within normal limits  CBC - Abnormal; Notable for the following:    RBC 3.75 (*)    Hemoglobin 9.5 (*)    HCT 28.1 (*)    MCV 75.1 (*)    MCH 25.3 (*)    RDW 17.3 (*)    All other components within normal limits  URINALYSIS, COMPLETE (UACMP) WITH MICROSCOPIC - Abnormal; Notable for the following:    Color, Urine AMBER (*)    APPearance CLEAR (*)    Ketones, ur 5 (*)    Protein, ur 100 (*)    Leukocytes, UA MODERATE (*)    Squamous Epithelial / LPF 6-30 (*)    All other components within normal limits  LIPASE, BLOOD     ED Course:  Patient had an IV established and was given fluid bolus. Patient does not appear to have any complications with her pregnancy has been on Phenergan tablets throughout her pregnancy. She does not have any Phenergan suppositories. She is able tolerate  food and fluid intake and has fetal movements plus fetal heart tones at 136 identified in the triage area.     Assessment:  Viral gastroenteritis      Plan: * Outpatient " New Prescriptions   PROMETHAZINE (PHENERGAN) 12.5 MG SUPPOSITORY    Place 1 suppository (12.5 mg total) rectally every 6 (six) hours as needed for nausea or vomiting.  " Patient was advised to return immediately if condition worsens. Patient was advised to follow up with their primary care physician or other  specialized physicians involved in their outpatient care. The patient and/or family member/power of attorney had laboratory results reviewed at the bedside. All questions and concerns were addressed and appropriate discharge instructions were distributed by the nursing staff.             Jennye Moccasin, MD 04/10/16 [phone]

## 2016-04-10 NOTE — Discharge Instructions (Signed)
Return immediately for high fever, vaginal bleeding, focal abdominal pain, or any other new concerns. Please advance diet as tolerated and contact your OB/GYN for further outpatient follow-up and recheck.  Please return immediately if condition worsens. Please contact her primary physician or the physician you were given for referral. If you have any specialist physicians involved in her treatment and plan please also contact them. Thank you for using [facility].

## 2016-04-10 NOTE — ED Triage Notes (Addendum)
Vomiting x 48 hours.  Also c/o nausea and diarrhea and some abdominal cramping with vomiting.  Patient has been trying to take phenergan at home, but unable to keep on stomach.  Also patient has a UTI and has not been able to keep antibiotics down.

## 2016-04-12 LAB — URINE CULTURE: Culture: <10000 — AB

## 2016-04-14 ENCOUNTER — Observation Stay
Admission: EM | Admit: 2016-04-14 | Discharge: 2016-04-14 | Disposition: A | Discharge status: Home / Self Care | Payer: Medicaid Other | Attending: Obstetrics and Gynecology | Admitting: Obstetrics and Gynecology

## 2016-04-14 ENCOUNTER — Encounter: Payer: Self-pay | Admitting: *Deleted

## 2016-04-14 DIAGNOSIS — Z880 Allergy status to penicillin: Secondary | ICD-10-CM | POA: Insufficient documentation

## 2016-04-14 DIAGNOSIS — F329 Major depressive disorder, single episode, unspecified: Secondary | ICD-10-CM | POA: Diagnosis not present

## 2016-04-14 DIAGNOSIS — R110 Nausea: Secondary | ICD-10-CM | POA: Diagnosis not present

## 2016-04-14 DIAGNOSIS — Z87891 Personal history of nicotine dependence: Secondary | ICD-10-CM | POA: Diagnosis not present

## 2016-04-14 DIAGNOSIS — J45909 Unspecified asthma, uncomplicated: Secondary | ICD-10-CM | POA: Insufficient documentation

## 2016-04-14 DIAGNOSIS — F4310 Post-traumatic stress disorder, unspecified: Secondary | ICD-10-CM | POA: Diagnosis not present

## 2016-04-14 DIAGNOSIS — F609 Personality disorder, unspecified: Secondary | ICD-10-CM | POA: Insufficient documentation

## 2016-04-14 DIAGNOSIS — O99343 Other mental disorders complicating pregnancy, third trimester: Secondary | ICD-10-CM | POA: Insufficient documentation

## 2016-04-14 DIAGNOSIS — Z884 Allergy status to anesthetic agent status: Secondary | ICD-10-CM | POA: Insufficient documentation

## 2016-04-14 DIAGNOSIS — O99513 Diseases of the respiratory system complicating pregnancy, third trimester: Secondary | ICD-10-CM | POA: Insufficient documentation

## 2016-04-14 DIAGNOSIS — G8929 Other chronic pain: Secondary | ICD-10-CM | POA: Diagnosis not present

## 2016-04-14 DIAGNOSIS — G4700 Insomnia, unspecified: Secondary | ICD-10-CM | POA: Diagnosis not present

## 2016-04-14 DIAGNOSIS — O368130 Decreased fetal movements, third trimester, not applicable or unspecified: Secondary | ICD-10-CM | POA: Diagnosis not present

## 2016-04-14 DIAGNOSIS — Z3A36 36 weeks gestation of pregnancy: Secondary | ICD-10-CM | POA: Insufficient documentation

## 2016-04-14 DIAGNOSIS — Z79899 Other long term (current) drug therapy: Secondary | ICD-10-CM | POA: Diagnosis not present

## 2016-04-14 DIAGNOSIS — Z888 Allergy status to other drugs, medicaments and biological substances status: Secondary | ICD-10-CM | POA: Insufficient documentation

## 2016-04-14 DIAGNOSIS — M549 Dorsalgia, unspecified: Secondary | ICD-10-CM | POA: Diagnosis not present

## 2016-04-14 MED ORDER — PROMETHAZINE HCL 12.5 MG PO TABS: 12.5000 mg | ORAL_TABLET | Freq: Four times a day (QID) | ORAL | 0 refills | Status: DC | PRN

## 2016-04-14 MED ORDER — ONDANSETRON 4 MG PO TBDP
4.0000 mg | ORAL_TABLET | Freq: Once | ORAL | Status: AC
  Administered 2016-04-14: 4 mg via ORAL
  Filled 2016-04-14: qty 1

## 2016-04-14 NOTE — Discharge Summary (Signed)
Physician Final Progress Note  Patient ID: Kristy Reid MRN: [id number] DOB/AGE: 22/05/1995 22 y.o.  Admit date: 04/14/2016 Admitting provider: Vena AustriaAndreas Staebler, MD Discharge date: 04/14/2016   Admission Diagnoses: G3 P1 at 9557w3d with c/o nausea since yesterday afternoon, contractions every 15 minutes since this morning and decreased fetal movement since this morning. Pt denies LOF,VB.  Discharge Diagnoses:  Active Problems:   Indication for care in labor and delivery, antepartum IUP at 557w3d with reactive NST, not in labor, nausea resolving  History of Present Illness: The patient was admitted for observation, placed on monitors, zofran given  Past Medical History:  Diagnosis Date  . Anemia   . Anemia   . Anxiety   . Asthma   . Chronic back pain   . Depression   . Insomnia   . Personality disorder    "boarderline"  . PTSD (post-traumatic stress disorder)     Past Surgical History:  Procedure Laterality Date  . BUNIONECTOMY Bilateral    feet  . wrist sugery      No current facility-administered medications on file prior to encounter.    Current Outpatient Prescriptions on File Prior to Encounter  Medication Sig Dispense Refill  . albuterol (PROVENTIL) (5 MG/ML) 0.5% nebulizer solution Take 2.5 mg by nebulization every 6 (six) hours as needed for wheezing or shortness of breath.    . fluticasone (FLONASE) 50 MCG/ACT nasal spray Place 2 sprays into both nostrils 2 (two) times daily.    . Prenatal Vit-Fe Fumarate-FA (MULTIVITAMIN-PRENATAL) 27-0.8 MG TABS tablet Take 1 tablet by mouth daily at 12 noon.    . promethazine (PHENERGAN) 12.5 MG tablet Take 12.5 mg by mouth every 6 (six) hours as needed for nausea.    Marland Kitchen. QUEtiapine (SEROQUEL) 50 MG tablet Take 50 mg by mouth at bedtime.    . promethazine (PHENERGAN) 12.5 MG suppository Place 1 suppository (12.5 mg total) rectally every 6 (six) hours as needed for nausea or vomiting. 12 each 0    Allergies  Allergen Reactions   . Penicillins Hives    Has patient had a PCN reaction causing immediate rash, facial/tongue/throat swelling, SOB or lightheadedness with hypotension: No Has patient had a PCN reaction causing severe rash involving mucus membranes or skin necrosis: No Has patient had a PCN reaction that required hospitalization No Has patient had a PCN reaction occurring within the last 10 years: No If all of the above answers are "NO", then may proceed with Cephalosporin use.  . Rocephin [Ceftriaxone] Hives  . Lidocaine Hives, Itching and Rash    Social History   Social History  . Marital status: Single    Spouse name: N/A  . Number of children: N/A  . Years of education: N/A   Occupational History  . Not on file.   Social History Main Topics  . Smoking status: Former Smoker    Types: Cigarettes    Quit date: 08/13/2015  . Smokeless tobacco: Never Used  . Alcohol use No  . Drug use: Yes    Frequency: 1.0 time per week    Types: Marijuana     Comment: last 2 days ago  . Sexual activity: Yes    Birth control/ protection: Injection   Other Topics Concern  . Not on file   Social History Narrative  . No narrative on file    Physical Exam: Temp 98.2 F (36.8 C) (Oral)   Resp 18   Ht 5\' 2"  (1.575 m)   Wt 195 lb (88.5  kg)   LMP 08/03/2015 Comment: positive pregnancy test prior to imaging  BMI 35.67 kg/m   Gen: NAD CV: RRR Pulm: CTAB Pelvic: deferred Toco: occasional contractions Fetal Well Being: 130 bpm, moderate variability, +accelerations, -decelerations Category I Ext: no evidence of DVT  Consults: None  Significant Findings/ Diagnostic Studies: none  Procedures: NST  Discharge Condition: good  Disposition: 01-Home or Self Care  Diet: Regular diet  Discharge Activity: Activity as tolerated  Discharge Instructions    Discharge activity:  No Restrictions    Complete by:  As directed    Discharge diet:  No restrictions    Complete by:  As directed    Fetal Kick  Count:  Lie on our left side for one hour after a meal, and count the number of times your baby kicks.  If it is less than 5 times, get up, move around and drink some juice.  Repeat the test 30 minutes later.  If it is still less than 5 kicks in an hour, notify your doctor.    Complete by:  As directed    No sexual activity restrictions    Complete by:  As directed    Notify physician for a general feeling that "something is not right"    Complete by:  As directed    Notify physician for increase or change in vaginal discharge    Complete by:  As directed    Notify physician for intestinal cramps, with or without diarrhea, sometimes described as "gas pain"    Complete by:  As directed    Notify physician for leaking of fluid    Complete by:  As directed    Notify physician for low, dull backache, unrelieved by heat or Tylenol    Complete by:  As directed    Notify physician for menstrual like cramps    Complete by:  As directed    Notify physician for pelvic pressure    Complete by:  As directed    Notify physician for uterine contractions.  These may be painless and feel like the uterus is tightening or the baby is  "balling up"    Complete by:  As directed    Notify physician for vaginal bleeding    Complete by:  As directed    PRETERM LABOR:  Includes any of the follwing symptoms that occur between 20 - [redacted] weeks gestation.  If these symptoms are not stopped, preterm labor can result in preterm delivery, placing your baby at risk    Complete by:  As directed      Allergies as of 04/14/2016      Reactions   Penicillins Hives   Has patient had a PCN reaction causing immediate rash, facial/tongue/throat swelling, SOB or lightheadedness with hypotension: No Has patient had a PCN reaction causing severe rash involving mucus membranes or skin necrosis: No Has patient had a PCN reaction that required hospitalization No Has patient had a PCN reaction occurring within the last 10 years:  No If all of the above answers are "NO", then may proceed with Cephalosporin use.   Rocephin [ceftriaxone] Hives   Lidocaine Hives, Itching, Rash      Medication List    TAKE these medications   albuterol (5 MG/ML) 0.5% nebulizer solution Commonly known as:  PROVENTIL Take 2.5 mg by nebulization every 6 (six) hours as needed for wheezing or shortness of breath.   fluticasone 50 MCG/ACT nasal spray Commonly known as:  FLONASE Place 2 sprays into  both nostrils 2 (two) times daily.   multivitamin-prenatal 27-0.8 MG Tabs tablet Take 1 tablet by mouth daily at 12 noon.   promethazine 12.5 MG tablet Commonly known as:  PHENERGAN Take 1 tablet (12.5 mg total) by mouth every 6 (six) hours as needed for nausea or vomiting. What changed:  reasons to take this  Another medication with the same name was removed. Continue taking this medication, and follow the directions you see here.   SEROQUEL 50 MG tablet Generic drug:  QUEtiapine Take 50 mg by mouth at bedtime.      Follow-up Information    [facility] Follow up on 04/17/2016.   Contact information: [address] [place] [address] [phone]          Total time spent taking care of this patient: 20 minutes  Signed: Tresea Mall, CNM  04/14/2016, 12:07 PM

## 2016-04-14 NOTE — OB Triage Note (Signed)
Pt presents with complaint of decreased fetal movement and contractions about every 15 mins since about 7am. Denies bleeding or leaking of fluid. States increased frequency of urination, but denies any burning or pressure.

## 2016-04-19 ENCOUNTER — Observation Stay
Admission: EM | Admit: 2016-04-19 | Discharge: 2016-04-19 | Disposition: A | Discharge status: Home / Self Care | Payer: Medicaid Other | Attending: Obstetrics and Gynecology | Admitting: Obstetrics and Gynecology

## 2016-04-19 DIAGNOSIS — O2693 Pregnancy related conditions, unspecified, third trimester: Principal | ICD-10-CM | POA: Insufficient documentation

## 2016-04-19 DIAGNOSIS — M25552 Pain in left hip: Secondary | ICD-10-CM | POA: Diagnosis not present

## 2016-04-19 DIAGNOSIS — Z880 Allergy status to penicillin: Secondary | ICD-10-CM | POA: Diagnosis not present

## 2016-04-19 DIAGNOSIS — M79662 Pain in left lower leg: Secondary | ICD-10-CM | POA: Diagnosis not present

## 2016-04-19 DIAGNOSIS — Z3A37 37 weeks gestation of pregnancy: Secondary | ICD-10-CM | POA: Insufficient documentation

## 2016-04-19 DIAGNOSIS — O26893 Other specified pregnancy related conditions, third trimester: Secondary | ICD-10-CM | POA: Diagnosis present

## 2016-04-19 MED ORDER — HYDROCODONE-ACETAMINOPHEN 5-325 MG PO TABS: 1.0000 | ORAL_TABLET | Freq: Every evening | ORAL | 0 refills | Status: DC | PRN

## 2016-04-19 NOTE — Discharge Instructions (Signed)
LABOR: When contractions begin, you should start to time them from the beginning of one contraction to the beginning of the next.  When contractions are 5-10 minutes apart or less and have been regular for at least an hour, you should call your health care provider. ° °Notify your doctor if any of the following occur: °1. Bleeding from the vagina 7. Sudden, constant, or occasional abdominal pain  °2. Pain or burning when urinating 8. Sudden gushing of fluid from the vagina (with or without continued leaking)  °3. Chills or fever 9. Fainting spells, "black outs" or loss of consciousness  °4. Increase in vaginal discharge 10. Severe or continued nausea or vomiting  °5. Pelvic pressure (sudden increase) 11. Blurring of vision or spots before the eyes  °6. Baby moving less than usual 12. Leaking of fluid  ° ° °FETAL KICK COUNT: °Lie on your left side for one hour after a meal, and count the number of times your baby kicks. If it is less than 5 times, get up, move around and drink some juice. Repeat the test 30 minutes later. If it is still less than 5 kicks in an hour, notify your doctor. °

## 2016-04-19 NOTE — OB Triage Note (Signed)
Patient presents via ems  with complains of pain in her hips , 8 of 10, hurting to point where she is having trouble walking, she has tried medications and position changes with no relief.

## 2016-04-19 NOTE — Final Progress Note (Signed)
Physician Final Progress Note  Patient ID: Kristy Reid MRN: [id number] DOB/AGE: 22/05/1995 22 y.o.  Admit date: 04/19/2016 Admitting provider: Conard NovakStephen D Azam Gervasi, MD Discharge date: 04/19/2016   Admission Diagnoses:  1) intrauterine pregnancy at 5347w1d 2) left hip and leg pain  Discharge Diagnoses:  1) intrauterine pregnancy at 5947w1d 2) left hip and leg pain, likely sciatica  Hospital Course: presented with multiple weeks of left hip pain that radiates down her the left side of her leg down to her ankle.  The pain is described at 10/10.  It is a sharp, shooting pain.  Nothing makes it better. Movement and certain positions make it worse.  No associated symptoms.  Denies numbness, tingling and weakness in her left leg.   Exam benign, no evidence of weakness or loss of sensation.  She denies contractions, vaginal bleeding, leakage of fluid. Notes +FM. For her leg pain she has tried tylenol, flexeril, heat, cold. She states she has tried a prednisone taper earlier in the pregnancy and this did not work.    Consults: None  Significant Findings/ Diagnostic Studies: none  Procedures: NST Baseline: 125 bpm Variability: moderate Accelerations: present Decelerations: absent Tocometry: 1-2 q 10 min   Discharge Condition: stable  Disposition: 01-Home or Self Care  Diet: Regular diet  Discharge Activity: Activity as tolerated   Allergies as of 04/19/2016      Reactions   Penicillins Hives   Has patient had a PCN reaction causing immediate rash, facial/tongue/throat swelling, SOB or lightheadedness with hypotension: No Has patient had a PCN reaction causing severe rash involving mucus membranes or skin necrosis: No Has patient had a PCN reaction that required hospitalization No Has patient had a PCN reaction occurring within the last 10 years: No If all of the above answers are "NO", then may proceed with Cephalosporin use.   Rocephin [ceftriaxone] Hives   Lidocaine Hives, Itching, Rash       Medication List    TAKE these medications   albuterol (5 MG/ML) 0.5% nebulizer solution Commonly known as:  PROVENTIL Take 2.5 mg by nebulization every 6 (six) hours as needed for wheezing or shortness of breath.   cyclobenzaprine 5 MG tablet Commonly known as:  FLEXERIL Take 5 mg by mouth 3 (three) times daily as needed for muscle spasms.   fluticasone 50 MCG/ACT nasal spray Commonly known as:  FLONASE Place 2 sprays into both nostrils 2 (two) times daily.   HYDROcodone-acetaminophen 5-325 MG tablet Commonly known as:  NORCO Take 1 tablet by mouth at bedtime as needed for moderate pain.   multivitamin-prenatal 27-0.8 MG Tabs tablet Take 1 tablet by mouth daily at 12 noon.   promethazine 12.5 MG tablet Commonly known as:  PHENERGAN Take 1 tablet (12.5 mg total) by mouth every 6 (six) hours as needed for nausea or vomiting.   SEROQUEL 50 MG tablet Generic drug:  QUEtiapine Take 50 mg by mouth at bedtime.        Total time spent taking care of this patient: 20 minutes  Signed: Conard NovakJackson, Maggie Dworkin D 04/19/2016, 7:08 PM

## 2016-05-01 ENCOUNTER — Inpatient Hospital Stay
Admission: EM | Admit: 2016-05-01 | Discharge: 2016-05-01 | Disposition: A | Discharge status: Home / Self Care | Payer: Medicaid Other | Attending: Obstetrics & Gynecology | Admitting: Obstetrics & Gynecology

## 2016-05-01 DIAGNOSIS — Z3A00 Weeks of gestation of pregnancy not specified: Secondary | ICD-10-CM | POA: Insufficient documentation

## 2016-05-01 DIAGNOSIS — O98819 Other maternal infectious and parasitic diseases complicating pregnancy, unspecified trimester: Secondary | ICD-10-CM | POA: Diagnosis present

## 2016-05-01 DIAGNOSIS — O98319 Other infections with a predominantly sexual mode of transmission complicating pregnancy, unspecified trimester: Secondary | ICD-10-CM | POA: Insufficient documentation

## 2016-05-01 DIAGNOSIS — A5602 Chlamydial vulvovaginitis: Secondary | ICD-10-CM | POA: Insufficient documentation

## 2016-05-01 MED ORDER — DEXTROSE 5 % IV SOLN
1000.0000 mg | Freq: Once | INTRAVENOUS | Status: AC
  Administered 2016-05-01: 1000 mg via INTRAVENOUS
  Filled 2016-05-01: qty 1000

## 2016-05-01 MED ORDER — ONDANSETRON HCL 4 MG/2ML IJ SOLN
4.0000 mg | Freq: Once | INTRAMUSCULAR | Status: AC
  Administered 2016-05-01: 4 mg via INTRAVENOUS
  Filled 2016-05-01: qty 2

## 2016-05-01 MED ORDER — DIPHENHYDRAMINE HCL 50 MG/ML IJ SOLN
25.0000 mg | Freq: Once | INTRAMUSCULAR | Status: AC
  Administered 2016-05-01: 25 mg via INTRAVENOUS
  Filled 2016-05-01: qty 1

## 2016-05-01 MED ORDER — AZITHROMYCIN 500 MG IV SOLR: 1000.0000 mg | Freq: Once | INTRAVENOUS | Status: DC

## 2016-05-01 MED ORDER — ONDANSETRON HCL 4 MG/2ML IJ SOLN
INTRAMUSCULAR | Status: AC
Start: 2016-05-01 — End: 2016-05-01
  Administered 2016-05-01: 4 mg via INTRAVENOUS
  Filled 2016-05-01: qty 2

## 2016-05-01 MED ORDER — DIPHENHYDRAMINE HCL 50 MG/ML IJ SOLN
INTRAMUSCULAR | Status: AC
  Administered 2016-05-01: 25 mg via INTRAVENOUS
  Filled 2016-05-01: qty 1

## 2016-05-01 NOTE — Progress Notes (Signed)
Patient here for IV antibiotic tx. Endorses fetal movement, denies vaginal bleeding. Per Tresea MallJane Gledhill, CNM patient does not need cervical exam of fetal monitoring. Patient agreeable to plan of care.

## 2016-05-01 NOTE — Progress Notes (Addendum)
Patient called out complaining of vomiting x3 occurrences and continuous nausea even after zofran administration. Patient refusing to continue with infusion. Explained to patient importance of treating infection before delivery, patient verbalized understanding and does not wish to continue with medication. Infusion stopped with approximately half the medication remaining and J. Gledhill, CNM called and notified of symptoms and refusal of medication. Per CNM discontinue infusion, patient may leave in 30 minutes if symptoms improve. Will continue to monitor.   2300-Patient states symptoms have improved and would like to go home, patient states she is not driving and has a ride. Patient denies questions or needs, ambulatory off unit in stable condition.

## 2016-05-01 NOTE — Discharge Instructions (Signed)
Drink plenty of fluids, rest frequently.  °

## 2016-05-07 ENCOUNTER — Inpatient Hospital Stay
Admission: EM | Admit: 2016-05-07 | Discharge: 2016-05-09 | DRG: 775 | Disposition: A | Discharge status: Home / Self Care | Payer: Medicaid Other | Attending: Obstetrics & Gynecology | Admitting: Obstetrics & Gynecology

## 2016-05-07 ENCOUNTER — Inpatient Hospital Stay
Admission: EM | Admit: 2016-05-07 | Discharge: 2016-05-07 | Disposition: A | Discharge status: Home / Self Care | Payer: Medicaid Other | Source: Home / Self Care | Admitting: Obstetrics and Gynecology

## 2016-05-07 DIAGNOSIS — Z3A39 39 weeks gestation of pregnancy: Secondary | ICD-10-CM | POA: Diagnosis not present

## 2016-05-07 DIAGNOSIS — O99824 Streptococcus B carrier state complicating childbirth: Secondary | ICD-10-CM | POA: Diagnosis present

## 2016-05-07 DIAGNOSIS — F339 Major depressive disorder, recurrent, unspecified: Secondary | ICD-10-CM | POA: Diagnosis present

## 2016-05-07 DIAGNOSIS — O99334 Smoking (tobacco) complicating childbirth: Secondary | ICD-10-CM | POA: Diagnosis present

## 2016-05-07 DIAGNOSIS — J45909 Unspecified asthma, uncomplicated: Secondary | ICD-10-CM | POA: Diagnosis present

## 2016-05-07 DIAGNOSIS — O99344 Other mental disorders complicating childbirth: Secondary | ICD-10-CM | POA: Diagnosis present

## 2016-05-07 DIAGNOSIS — Z880 Allergy status to penicillin: Secondary | ICD-10-CM

## 2016-05-07 DIAGNOSIS — O99324 Drug use complicating childbirth: Secondary | ICD-10-CM | POA: Diagnosis present

## 2016-05-07 DIAGNOSIS — O9952 Diseases of the respiratory system complicating childbirth: Secondary | ICD-10-CM | POA: Diagnosis present

## 2016-05-07 DIAGNOSIS — F1220 Cannabis dependence, uncomplicated: Secondary | ICD-10-CM | POA: Diagnosis present

## 2016-05-07 DIAGNOSIS — Z3493 Encounter for supervision of normal pregnancy, unspecified, third trimester: Secondary | ICD-10-CM | POA: Diagnosis present

## 2016-05-07 LAB — CBC
HEMATOCRIT: 31.8 % — AB (ref 35.0–47.0)
HEMOGLOBIN: 10.2 g/dL — AB (ref 12.0–16.0)
MCH: 23.8 pg — ABNORMAL LOW (ref 26.0–34.0)
MCHC: 32.1 g/dL (ref 32.0–36.0)
MCV: 74.1 fL — ABNORMAL LOW (ref 80.0–100.0)
Platelets: 256 10*3/uL (ref 150–440)
RBC: 4.29 MIL/uL (ref 3.80–5.20)
RDW: 18 % — ABNORMAL HIGH (ref 11.5–14.5)
WBC: 9.9 10*3/uL (ref 3.6–11.0)

## 2016-05-07 LAB — TYPE AND SCREEN
ABO/RH(D): O POS
Antibody Screen: NEGATIVE

## 2016-05-07 LAB — RAPID HIV SCREEN (HIV 1/2 AB+AG)
HIV 1/2 ANTIBODIES: NONREACTIVE
HIV-1 P24 ANTIGEN - HIV24: NONREACTIVE

## 2016-05-07 MED ORDER — OXYTOCIN 40 UNITS IN LACTATED RINGERS INFUSION - SIMPLE MED
INTRAVENOUS | Status: AC
  Administered 2016-05-07: 500 mL via INTRAVENOUS
  Filled 2016-05-07: qty 1000

## 2016-05-07 MED ORDER — SODIUM CHLORIDE 0.9 % IV SOLN: 250.0000 mL | INTRAVENOUS | Status: DC | PRN

## 2016-05-07 MED ORDER — LIDOCAINE HCL (PF) 1 % IJ SOLN
INTRAMUSCULAR | Status: AC
  Filled 2016-05-07: qty 30

## 2016-05-07 MED ORDER — OXYTOCIN 10 UNIT/ML IJ SOLN
INTRAMUSCULAR | Status: AC
  Filled 2016-05-07: qty 2

## 2016-05-07 MED ORDER — SODIUM CHLORIDE 0.9% FLUSH: 3.0000 mL | INTRAVENOUS | Status: DC | PRN

## 2016-05-07 MED ORDER — LACTATED RINGERS IV SOLN
INTRAVENOUS | Status: DC
  Administered 2016-05-07: 17:00:00 via INTRAVENOUS

## 2016-05-07 MED ORDER — ZOLPIDEM TARTRATE 5 MG PO TABS: 5.0000 mg | ORAL_TABLET | Freq: Every evening | ORAL | Status: DC | PRN

## 2016-05-07 MED ORDER — CLINDAMYCIN PHOSPHATE 900 MG/50ML IV SOLN
900.0000 mg | Freq: Three times a day (TID) | INTRAVENOUS | Status: DC
  Administered 2016-05-07: 900 mg via INTRAVENOUS
  Filled 2016-05-07 (×4): qty 50

## 2016-05-07 MED ORDER — WITCH HAZEL-GLYCERIN EX PADS: 1.0000 "application " | MEDICATED_PAD | CUTANEOUS | Status: DC | PRN

## 2016-05-07 MED ORDER — LACTATED RINGERS IV SOLN: 500.0000 mL | INTRAVENOUS | Status: DC | PRN

## 2016-05-07 MED ORDER — BENZOCAINE-MENTHOL 20-0.5 % EX AERO: 1.0000 "application " | INHALATION_SPRAY | CUTANEOUS | Status: DC | PRN

## 2016-05-07 MED ORDER — ONDANSETRON HCL 4 MG PO TABS: 4.0000 mg | ORAL_TABLET | ORAL | Status: DC | PRN

## 2016-05-07 MED ORDER — OXYTOCIN BOLUS FROM INFUSION
500.0000 mL | Freq: Once | INTRAVENOUS | Status: AC
  Administered 2016-05-07: 500 mL via INTRAVENOUS

## 2016-05-07 MED ORDER — BUTORPHANOL TARTRATE 1 MG/ML IJ SOLN
1.0000 mg | INTRAMUSCULAR | Status: DC | PRN
  Administered 2016-05-07: 1 mg via INTRAVENOUS

## 2016-05-07 MED ORDER — ONDANSETRON HCL 4 MG/2ML IJ SOLN: 4.0000 mg | Freq: Four times a day (QID) | INTRAMUSCULAR | Status: DC | PRN

## 2016-05-07 MED ORDER — MISOPROSTOL 200 MCG PO TABS
ORAL_TABLET | ORAL | Status: AC
  Filled 2016-05-07: qty 4

## 2016-05-07 MED ORDER — DIBUCAINE 1 % RE OINT: 1.0000 "application " | TOPICAL_OINTMENT | RECTAL | Status: DC | PRN

## 2016-05-07 MED ORDER — BUTORPHANOL TARTRATE 1 MG/ML IJ SOLN
INTRAMUSCULAR | Status: AC
  Filled 2016-05-07: qty 1

## 2016-05-07 MED ORDER — SENNOSIDES-DOCUSATE SODIUM 8.6-50 MG PO TABS
2.0000 | ORAL_TABLET | ORAL | Status: DC
  Administered 2016-05-08 – 2016-05-09 (×2): 2 via ORAL
  Filled 2016-05-07 (×2): qty 2

## 2016-05-07 MED ORDER — ACETAMINOPHEN 325 MG PO TABS: 650.0000 mg | ORAL_TABLET | ORAL | Status: DC | PRN

## 2016-05-07 MED ORDER — IBUPROFEN 600 MG PO TABS
600.0000 mg | ORAL_TABLET | Freq: Four times a day (QID) | ORAL | Status: DC
  Administered 2016-05-08 – 2016-05-09 (×6): 600 mg via ORAL
  Filled 2016-05-07 (×6): qty 1

## 2016-05-07 MED ORDER — ACETAMINOPHEN 325 MG PO TABS
650.0000 mg | ORAL_TABLET | ORAL | Status: DC | PRN
  Administered 2016-05-08 – 2016-05-09 (×3): 650 mg via ORAL
  Filled 2016-05-07 (×3): qty 2

## 2016-05-07 MED ORDER — AMMONIA AROMATIC IN INHA
RESPIRATORY_TRACT | Status: AC
  Filled 2016-05-07: qty 10

## 2016-05-07 MED ORDER — ONDANSETRON HCL 4 MG/2ML IJ SOLN: 4.0000 mg | INTRAMUSCULAR | Status: DC | PRN

## 2016-05-07 MED ORDER — SODIUM CHLORIDE 0.9% FLUSH: 3.0000 mL | Freq: Two times a day (BID) | INTRAVENOUS | Status: DC

## 2016-05-07 MED ORDER — DIPHENHYDRAMINE HCL 25 MG PO CAPS: 25.0000 mg | ORAL_CAPSULE | Freq: Four times a day (QID) | ORAL | Status: DC | PRN

## 2016-05-07 MED ORDER — COCONUT OIL OIL: 1.0000 "application " | TOPICAL_OIL | Status: DC | PRN

## 2016-05-07 MED ORDER — OXYTOCIN 40 UNITS IN LACTATED RINGERS INFUSION - SIMPLE MED
2.5000 [IU]/h | INTRAVENOUS | Status: DC
  Administered 2016-05-07: 2.5 [IU]/h via INTRAVENOUS

## 2016-05-07 MED ORDER — IBUPROFEN 600 MG PO TABS
600.0000 mg | ORAL_TABLET | Freq: Four times a day (QID) | ORAL | Status: DC
  Administered 2016-05-07: 600 mg via ORAL
  Filled 2016-05-07: qty 1

## 2016-05-07 MED ORDER — SIMETHICONE 80 MG PO CHEW: 80.0000 mg | CHEWABLE_TABLET | ORAL | Status: DC | PRN

## 2016-05-07 MED ORDER — SENNOSIDES-DOCUSATE SODIUM 8.6-50 MG PO TABS: 2.0000 | ORAL_TABLET | ORAL | Status: DC

## 2016-05-07 NOTE — Discharge Summary (Signed)
Obstetrical Discharge Summary  Date of Admission: 05/07/2016 Date of Discharge: 05/09/2016  Discharge Diagnosis: Term Pregnancy-delivered Primary OB:  [facility]   Gestational Age at Delivery: [redacted]w[redacted]d  Antepartum complications: none Date of Delivery: 05/07/2016  Delivered By: Annamarie Major, MD Delivery Type: spontaneous vaginal delivery Intrapartum complications/course: None Anesthesia: none Placenta: spontaneous Laceration: n/a Episiotomy: none Live born unspecified sex  Birth Weight: 6 lb 9.8 oz (3000 g) APGAR: 7, 9   Post partum course: Since the delivery, patient has tolerate activity, diet, and daily functions without difficulty or complication.  Min lochia.  No breast concerns at this time.  No signs of depression currently.  Restarted on seroquel and is doing well on that.   Postpartum Exam BP 112/68 (BP Location: Right Arm)   Pulse 90   Temp 98.4 F (36.9 C) (Oral)   Resp 20   Ht 5\' 2"  (1.575 m)   Wt 205 lb (93 kg)   LMP 08/03/2015 Comment: positive pregnancy test prior to imaging  SpO2 100%   Breastfeeding? Unknown   BMI 37.49 kg/m    General appearance: alert, cooperative and no distress GI: soft, non-tender; bowel sounds normal; no masses,  no organomegaly and Fundus firm Extremities: extremities normal, atraumatic, no cyanosis or edema and no edema, redness or tenderness in the calves or thighs  Disposition: home with infant Rh Immune globulin given: no Rubella vaccine given: no Varicella vaccine given: no Tdap vaccine given in AP or PP setting: yes Contraception: to be determined at post partum visit  Prenatal Labs: O POS//Rubella Immune//RPR negative//HIV negative/HepB Surface Ag negative//plans to formula feed  Plan:  Kristy Reid was discharged to home in good condition. Follow-[facility] appointment with [facility] provider in 2 week  Discharge Medications: Allergies as of 05/09/2016      Reactions   Penicillins Hives   Has patient had a PCN reaction causing  immediate rash, facial/tongue/throat swelling, SOB or lightheadedness with hypotension: No Has patient had a PCN reaction causing severe rash involving mucus membranes or skin necrosis: No Has patient had a PCN reaction that required hospitalization No Has patient had a PCN reaction occurring within the last 10 years: No If all of the above answers are "NO", then may proceed with Cephalosporin use.   Rocephin [ceftriaxone] Hives   Zithromax [azithromycin] Itching, Nausea And Vomiting   Lidocaine Hives, Itching, Rash      Medication List    STOP taking these medications   promethazine 12.5 MG tablet Commonly known as:  PHENERGAN     TAKE these medications   albuterol (5 MG/ML) 0.5% nebulizer solution Commonly known as:  PROVENTIL Take 2.5 mg by nebulization every 6 (six) hours as needed for wheezing or shortness of breath.   cyclobenzaprine 5 MG tablet Commonly known as:  FLEXERIL Take 5 mg by mouth 3 (three) times daily as needed for muscle spasms.   fluticasone 50 MCG/ACT nasal spray Commonly known as:  FLONASE Place 2 sprays into both nostrils 2 (two) times daily.   HYDROcodone-acetaminophen 5-325 MG tablet Commonly known as:  NORCO Take 1 tablet by mouth at bedtime as needed for moderate pain.   ibuprofen 600 MG tablet Commonly known as:  ADVIL,MOTRIN Take 1 tablet (600 mg total) by mouth every 6 (six) hours.   multivitamin-prenatal 27-0.8 MG Tabs tablet Take 1 tablet by mouth daily at 12 noon.   SEROQUEL 50 MG tablet Generic drug:  QUEtiapine Take 50 mg by mouth at bedtime.       Follow-up  arrangements:  Follow-up Information    Kristy Libraobert Paul Harris, MD. Schedule an appointment as soon as possible for a visit in 2 week(s).   Specialty:  Obstetrics and Gynecology Why:  depression check Contact information: [address] [place] [address] [phone]            Kristy MohairStephen Jayshawn Colston, MD 05/09/2016 10:06 AM

## 2016-05-07 NOTE — Discharge Instructions (Signed)
Discharge teaching complete, all questions answered. Next appointment on 05-11-16

## 2016-05-07 NOTE — H&P (Signed)
Obstetrics Admission History & Physical   CONTRACTION PAIN   HPI:  21 y.o. [id number] @ 3033w5d (05/09/2016, by Last Menstrual Period). Admitted on 05/07/2016:   Patient Active Problem List   Diagnosis Date Noted  . Labor and delivery, indication for care 04/19/2016  . Cannabis use disorder, severe, dependence (HCC) 11/15/2015  . Tobacco use disorder 11/15/2015  . Asthma 11/15/2015  . Borderline personality disorder 11/15/2015  . PTSD (post-traumatic stress disorder) 11/15/2015  . Severe recurrent major depression without psychotic features (HCC) 11/13/2015    Presents for labor sx's of pain w ctxs, no VB or ROM.   [facility] at [facility]. Pregnancy complicated by anemia, PTSD, substance use, chlamydia.  Prior NSVD w fast labor..  ROS: A review of systems was performed and negative, except as stated in the above HPI. PMHx:  Past Medical History:  Diagnosis Date  . Anemia   . Anemia   . Anxiety   . Asthma   . Chronic back pain   . Depression   . Insomnia   . Personality disorder    "boarderline"  . PTSD (post-traumatic stress disorder)    PSHx:  Past Surgical History:  Procedure Laterality Date  . BUNIONECTOMY Bilateral    feet  . wrist sugery     Medications:  Prescriptions Prior to Admission  Medication Sig Dispense Refill Last Dose  . albuterol (PROVENTIL) (5 MG/ML) 0.5% nebulizer solution Take 2.5 mg by nebulization every 6 (six) hours as needed for wheezing or shortness of breath.   Past Week at Unknown time  . cyclobenzaprine (FLEXERIL) 5 MG tablet Take 5 mg by mouth 3 (three) times daily as needed for muscle spasms.   Past Month at Unknown time  . fluticasone (FLONASE) 50 MCG/ACT nasal spray Place 2 sprays into both nostrils 2 (two) times daily.   Past Month at Unknown time  . HYDROcodone-acetaminophen (NORCO) 5-325 MG tablet Take 1 tablet by mouth at bedtime as needed for moderate pain. 10 tablet 0 Past Month at Unknown time  . Prenatal Vit-Fe Fumarate-FA  (MULTIVITAMIN-PRENATAL) 27-0.8 MG TABS tablet Take 1 tablet by mouth daily at 12 noon.   Past Week at Unknown time  . promethazine (PHENERGAN) 12.5 MG tablet Take 1 tablet (12.5 mg total) by mouth every 6 (six) hours as needed for nausea or vomiting. 24 tablet 0 Past Week at Unknown time  . QUEtiapine (SEROQUEL) 50 MG tablet Take 50 mg by mouth at bedtime.   Past Week at Unknown time   Allergies: is allergic to penicillins; rocephin [ceftriaxone]; zithromax [azithromycin]; and lidocaine. OBHx:  OB History  Gravida Para Term Preterm AB Living  3 1 1  0 1 1  SAB TAB Ectopic Multiple Live Births  1 0 0 0 1    # Outcome Date GA Lbr Len/2nd Weight Sex Delivery Anes PTL Lv  3 Current           2 SAB           1 Term         LIV     AVW:UJWJXBJY/NWGNFAOZHYQMFHx:Negative/unremarkable except as detailed in HPI.Marland Kitchen.  No family history of birth defects. Soc Hx: Current smoker  Objective:   Vitals:   05/07/16 1500 05/07/16 1643  BP:  135/85  Pulse:  (!) 108  Resp: 18 18  Temp: 98.8 F (37.1 C) 98.9 F (37.2 C)   Constitutional: Well nourished, well developed female in no acute distress.  HEENT: normal Skin: Warm and dry.  Cardiovascular:Regular  rate and rhythm.   Extremity: trace to 1+ bilateral pedal edema Respiratory: Clear to auscultation bilateral. Normal respiratory effort Abdomen: moderate Back: no CVAT Neuro: DTRs 2+, Cranial nerves grossly intact Psych: Alert and Oriented x3. No memory deficits. Normal mood and affect.  MS: normal gait, normal bilateral lower extremity ROM/strength/stability.  Pelvic exam: is not limited by body habitus EGBUS: within normal limits Vagina: within normal limits and with normal mucosa blood in the vault Cervix: 5 cm Uterus: Spontaneous uterine activity  Adnexa: not evaluated  EFM:FHR: 150 bpm, variability: moderate,  accelerations:  Present,  decelerations:  Absent Toco: Frequency: Every 5 minutes   Perinatal info:  Blood type: O positive Rubella-  Immune Varicella -Immune TDaP Given during third trimester of this pregnancy RPR NR / HIV Neg/ HBsAg Neg   Assessment & Plan:   21 y.o. [id number] @ [redacted]w[redacted]d, Admitted on 05/07/2016:LABOR     Admit for labor, Antibiotics for GBS prophylaxis, Observe for cervical change, Fetal Wellbeing Reassuring and AROM when Appropriate  (ABX due to _GBS in urine this pregnancy, despite recent vag swab culture neg)

## 2016-05-07 NOTE — OB Triage Note (Signed)
Presents with complaint of contractions every 2-3 mins. Denies leakage of fluid, or blood show.

## 2016-05-08 LAB — CBC
HEMATOCRIT: 28.6 % — AB (ref 35.0–47.0)
Hemoglobin: 9 g/dL — ABNORMAL LOW (ref 12.0–16.0)
MCH: 23.8 pg — AB (ref 26.0–34.0)
MCHC: 31.5 g/dL — AB (ref 32.0–36.0)
MCV: 75.6 fL — AB (ref 80.0–100.0)
Platelets: 199 10*3/uL (ref 150–440)
RBC: 3.78 MIL/uL — ABNORMAL LOW (ref 3.80–5.20)
RDW: 18.6 % — ABNORMAL HIGH (ref 11.5–14.5)
WBC: 11.1 10*3/uL — AB (ref 3.6–11.0)

## 2016-05-08 LAB — CHLAMYDIA/NGC RT PCR (ARMC ONLY)
Chlamydia Tr: NOT DETECTED
N GONORRHOEAE: NOT DETECTED

## 2016-05-08 LAB — RPR: RPR Ser Ql: NONREACTIVE

## 2016-05-08 MED ORDER — QUETIAPINE FUMARATE ER 50 MG PO TB24
50.0000 mg | ORAL_TABLET | Freq: Every day | ORAL | Status: DC
  Administered 2016-05-08: 50 mg via ORAL
  Filled 2016-05-08 (×2): qty 1

## 2016-05-08 NOTE — Discharge Instructions (Signed)
Please call your doctor or return to the ER if you experience any chest pains, shortness of breath, fever greater than 101, any heavy bleeding or large clots, and foul smelling vaginal discharge, any worsening abdominal pain & cramping that is not controlled by pain medication, or any signs of post partum depression.  No tampons, enemas, douches, or sexual intercourse for 6 weeks.  Also avoid tub baths, hot tubs, or swimming for 6 weeks.  Vaginal Delivery, Care After Refer to this sheet in the next few weeks. These instructions provide you with information about caring for yourself after vaginal delivery. Your health care provider may also give you more specific instructions. Your treatment has been planned according to current medical practices, but problems sometimes occur. Call your health care provider if you have any problems or questions. What can I expect after the procedure? After vaginal delivery, it is common to have:  Some bleeding from your vagina.  Soreness in your abdomen, your vagina, and the area of skin between your vaginal opening and your anus (perineum).  Pelvic cramps.  Fatigue. Follow these instructions at home: Medicines  Take over-the-counter and prescription medicines only as told by your health care provider.  If you were prescribed an antibiotic medicine, take it as told by your health care provider. Do not stop taking the antibiotic until it is finished. Driving  Do not drive or operate heavy machinery while taking prescription pain medicine.  Do not drive for 24 hours if you received a sedative. Lifestyle  Do not drink alcohol. This is especially important if you are breastfeeding or taking medicine to relieve pain.  Do not use tobacco products, including cigarettes, chewing tobacco, or e-cigarettes. If you need help quitting, ask your health care provider. Eating and drinking  Drink at least 8 eight-ounce glasses of water every day unless you are told not  to by your health care provider. If you choose to breastfeed your baby, you may need to drink more water than this.  Eat high-fiber foods every day. These foods may help prevent or relieve constipation. High-fiber foods include:  Whole grain cereals and breads.  Brown rice.  Beans.  Fresh fruits and vegetables. Activity  Return to your normal activities as told by your health care provider. Ask your health care provider what activities are safe for you.  Rest as much as possible. Try to rest or take a nap when your baby is sleeping.  Do not lift anything that is heavier than your baby or 10 lb (4.5 kg) until your health care provider says that it is safe.  Talk with your health care provider about when you can engage in sexual activity. This may depend on your:  Risk of infection.  Rate of healing.  Comfort and desire to engage in sexual activity. Vaginal Care  If you have an episiotomy or a vaginal tear, check the area every day for signs of infection. Check for:  More redness, swelling, or pain.  More fluid or blood.  Warmth.  Pus or a bad smell.  Do not use tampons or douches until your health care provider says this is safe.  Watch for any blood clots that may pass from your vagina. These may look like clumps of dark red, brown, or black discharge. General instructions  Keep your perineum clean and dry as told by your health care provider.  Wear loose, comfortable clothing.  Wipe from front to back when you use the toilet.  Ask your health  care provider if you can shower or take a bath. If you had an episiotomy or a perineal tear during labor and delivery, your health care provider may tell you not to take baths for a certain length of time.  Wear a bra that supports your breasts and fits you well.  If possible, have someone help you with household activities and help care for your baby for at least a few days after you leave the hospital.  Keep all  follow-up visits for you and your baby as told by your health care provider. This is important. Contact a health care provider if:  You have:  Vaginal discharge that has a bad smell.  Difficulty urinating.  Pain when urinating.  A sudden increase or decrease in the frequency of your bowel movements.  More redness, swelling, or pain around your episiotomy or vaginal tear.  More fluid or blood coming from your episiotomy or vaginal tear.  Pus or a bad smell coming from your episiotomy or vaginal tear.  A fever.  A rash.  Little or no interest in activities you used to enjoy.  Questions about caring for yourself or your baby.  Your episiotomy or vaginal tear feels warm to the touch.  Your episiotomy or vaginal tear is separating or does not appear to be healing.  Your breasts are painful, hard, or turn red.  You feel unusually sad or worried.  You feel nauseous or you vomit.  You pass large blood clots from your vagina. If you pass a blood clot from your vagina, save it to show to your health care provider. Do not flush blood clots down the toilet without having your health care provider look at them.  You urinate more than usual.  You are dizzy or light-headed.  You have not breastfed at all and you have not had a menstrual period for 12 weeks after delivery.  You have stopped breastfeeding and you have not had a menstrual period for 12 weeks after you stopped breastfeeding. Get help right away if:  You have:  Pain that does not go away or does not get better with medicine.  Chest pain.  Difficulty breathing.  Blurred vision or spots in your vision.  Thoughts about hurting yourself or your baby.  You develop pain in your abdomen or in one of your legs.  You develop a severe headache.  You faint.  You bleed from your vagina so much that you fill two sanitary pads in one hour. This information is not intended to replace advice given to you by your health  care provider. Make sure you discuss any questions you have with your health care provider. Document Released: 02/28/2000 Document Revised: 08/14/2015 Document Reviewed: 03/17/2015 Elsevier Interactive Patient Education  2017 [employer]

## 2016-05-08 NOTE — Clinical Social Work Maternal (Signed)
  CLINICAL SOCIAL WORK MATERNAL/CHILD NOTE  Patient Details  Name: Kristy Reid MRN: [id number] Date of Birth: 04/26/1995  Date:  05/08/2016  Clinical Social Worker Initiating Note:  York SpanielMonica Eliaz Fout MSW,LCSW Date/ Time Initiated:  05/08/16/1000     Child's Name:      Legal Guardian:  Mother   Need for Interpreter:  None   Date of Referral:        Reason for Referral:  Other (Comment) (carseat)   Referral Source:  RN   Address:     Phone number:      Household Members:  Significant Other, Parents   Natural Supports (not living in the home):  Extended Family   Professional Supports: None   Employment:     Type of Work:     Education:      Architectinancial Resources:  [employer]   Other Resources:  [employer]   Cultural/Religious Considerations Which May Impact Care:  none  Strengths:  Compliance with medical plan , Home prepared for child    Risk Factors/Current Problems:  Mental Health Concerns    Cognitive State:  Alert , Able to Concentrate , Goal Oriented    Mood/Affect:  Calm , Happy    CSW Assessment: CSW spoke with patient this morning and patient was pleasant in her demeanor. Patient was stating that she would need a car seat. When CSW informed patient that she would be responsible for obtaining her newborn a car seat, patient stated that the paternal grandfather is obtaining a car seat. Patient reports she has all other necessities for her newborn and has no concerns regarding transportation. Patient reports she has no financial concerns as well. She reports she has a lot of support.   In regards to her mental illness she states she has felt well since her psych admission during her pregnancy. She stated that she spoke with her OB this morning and she is going to restart her psych medication. Overall patient states she is feeling very happy at this time.   CSW Plan/Description:  Information/Referral to [employer]     [name], [place] 05/08/2016, 10:42  AM

## 2016-05-08 NOTE — Progress Notes (Signed)
Subjective:  Doing well no concerns, minimal lochia  Objective:   Blood pressure (!) 108/59, pulse 96, temperature 98.4 F (36.9 C), temperature source Oral, resp. rate 18, height 5\' 2"  (1.575 m), weight 205 lb (93 kg), last menstrual period 08/03/2015, SpO2 100 %, unknown if currently breastfeeding.  General: NAD Pulmonary: no increased work of breathing Abdomen: non-distended, non-tender, fundus firm at level of umbilicus Extremities: no edema, no erythema, no tenderness  Results for orders placed or performed during the hospital encounter of 05/07/16 (from the past 72 hour(s))  CBC     Status: Abnormal   Collection Time: 05/07/16  5:01 PM  Result Value Ref Range   WBC 9.9 3.6 - 11.0 K/uL   RBC 4.29 3.80 - 5.20 MIL/uL   Hemoglobin 10.2 (L) 12.0 - 16.0 g/dL   HCT 40.931.8 (L) 81.135.0 - 91.447.0 %   MCV 74.1 (L) 80.0 - 100.0 fL   MCH 23.8 (L) 26.0 - 34.0 pg   MCHC 32.1 32.0 - 36.0 g/dL   RDW 78.218.0 (H) 95.611.5 - 21.314.5 %   Platelets 256 150 - 440 K/uL  Type and screen [facility]     Status: None   Collection Time: 05/07/16  5:01 PM  Result Value Ref Range   ABO/RH(D) O POS    Antibody Screen NEG    Sample Expiration 05/10/2016   RPR     Status: None   Collection Time: 05/07/16  5:01 PM  Result Value Ref Range   RPR Ser Ql Non Reactive Non Reactive    Comment: (NOTE) Performed At: [facility] [address] [place], [place] [id number] Mila HomerHancock William F MD GE:[id number]:[phone]   Rapid HIV screen (HIV 1/2 Ab+Ag)     Status: None   Collection Time: 05/07/16  5:01 PM  Result Value Ref Range   HIV-1 P24 Antigen - HIV24 NON REACTIVE NON REACTIVE   HIV 1/2 Antibodies NON REACTIVE NON REACTIVE   Interpretation (HIV Ag Ab)      A non reactive test result means that HIV 1 or HIV 2 antibodies and HIV 1 p24 antigen were not detected in the specimen.  Chlamydia/NGC rt PCR ([facility] only)     Status: None   Collection Time: 05/08/16 12:51 AM  Result Value Ref Range   Specimen  source GC/Chlam URINE, RANDOM    Chlamydia Tr NOT DETECTED NOT DETECTED   N gonorrhoeae NOT DETECTED NOT DETECTED    Comment: (NOTE) 100  This methodology has not been evaluated in pregnant women or in 200  patients with a history of hysterectomy. 300 400  This methodology will not be performed on patients less than 314  years of age.   CBC     Status: Abnormal   Collection Time: 05/08/16  4:41 AM  Result Value Ref Range   WBC 11.1 (H) 3.6 - 11.0 K/uL   RBC 3.78 (L) 3.80 - 5.20 MIL/uL   Hemoglobin 9.0 (L) 12.0 - 16.0 g/dL   HCT 01.028.6 (L) 27.235.0 - 53.647.0 %   MCV 75.6 (L) 80.0 - 100.0 fL   MCH 23.8 (L) 26.0 - 34.0 pg   MCHC 31.5 (L) 32.0 - 36.0 g/dL   RDW 64.418.6 (H) 03.411.5 - 74.214.5 %   Platelets 199 150 - 440 K/uL    Assessment:   21 y.o. [id number] postpartum day # 1 TSVD  Plan:    1) Acute blood loss anemia - hemodynamically stable and asymptomatic - po ferrous sulfate  2) Blood  Type --/--/O POS (02/22 1701) / Rubella Immune (07/21 0000) / Varicella Immune  3) Depression - restart home dose seroquel  4) Disposition- anticipate PPD2 (baby only received 1 dose Abx for GBS positive)

## 2016-05-09 MED ORDER — IBUPROFEN 600 MG PO TABS: 600.0000 mg | ORAL_TABLET | Freq: Four times a day (QID) | ORAL | 0 refills | Status: DC

## 2016-05-09 NOTE — Progress Notes (Signed)
Discharge instructions complete and prescriptions given. Patient verbalizes understanding of teaching. Patient discharged home at 1740.

## 2016-05-18 ENCOUNTER — Telehealth: Payer: Self-pay | Admitting: Obstetrics and Gynecology

## 2016-05-18 NOTE — Telephone Encounter (Signed)
Pt is calling today to let Dr. Bonney AidStaebler know she has been in contact with someone who has tested postive with the Flu and would like to know what he would like her to do. Cb# [phone]

## 2016-05-20 ENCOUNTER — Emergency Department
Admission: EM | Admit: 2016-05-20 | Discharge: 2016-05-20 | Disposition: A | Discharge status: Home / Self Care | Payer: Medicaid Other | Attending: Emergency Medicine | Admitting: Emergency Medicine

## 2016-05-20 ENCOUNTER — Encounter: Payer: Self-pay | Admitting: *Deleted

## 2016-05-20 DIAGNOSIS — B349 Viral infection, unspecified: Secondary | ICD-10-CM | POA: Diagnosis not present

## 2016-05-20 DIAGNOSIS — R05 Cough: Secondary | ICD-10-CM | POA: Diagnosis present

## 2016-05-20 DIAGNOSIS — J45909 Unspecified asthma, uncomplicated: Secondary | ICD-10-CM | POA: Insufficient documentation

## 2016-05-20 DIAGNOSIS — Z79899 Other long term (current) drug therapy: Secondary | ICD-10-CM | POA: Insufficient documentation

## 2016-05-20 DIAGNOSIS — J069 Acute upper respiratory infection, unspecified: Secondary | ICD-10-CM | POA: Diagnosis not present

## 2016-05-20 DIAGNOSIS — Z87891 Personal history of nicotine dependence: Secondary | ICD-10-CM | POA: Insufficient documentation

## 2016-05-20 DIAGNOSIS — F1290 Cannabis use, unspecified, uncomplicated: Secondary | ICD-10-CM | POA: Insufficient documentation

## 2016-05-20 LAB — CBC
HCT: 38.8 % (ref 35.0–47.0)
Hemoglobin: 12.3 g/dL (ref 12.0–16.0)
MCH: 23.8 pg — AB (ref 26.0–34.0)
MCHC: 31.6 g/dL — ABNORMAL LOW (ref 32.0–36.0)
MCV: 75.5 fL — AB (ref 80.0–100.0)
PLATELETS: 291 10*3/uL (ref 150–440)
RBC: 5.15 MIL/uL (ref 3.80–5.20)
RDW: 20 % — AB (ref 11.5–14.5)
WBC: 7.1 10*3/uL (ref 3.6–11.0)

## 2016-05-20 LAB — BASIC METABOLIC PANEL
Anion gap: 10 (ref 5–15)
BUN: 10 mg/dL (ref 6–20)
CALCIUM: 9.6 mg/dL (ref 8.9–10.3)
CO2: 22 mmol/L (ref 22–32)
Chloride: 107 mmol/L (ref 101–111)
Creatinine, Ser: 0.77 mg/dL (ref 0.44–1.00)
GFR calc Af Amer: >60 mL/min (ref >60)
GLUCOSE: 107 mg/dL — AB (ref 65–99)
POTASSIUM: 4.2 mmol/L (ref 3.5–5.1)
Sodium: 139 mmol/L (ref 135–145)

## 2016-05-20 LAB — INFLUENZA PANEL BY PCR (TYPE A & B)
Influenza A By PCR: NEGATIVE
Influenza B By PCR: NEGATIVE

## 2016-05-20 LAB — POCT RAPID STREP A: Streptococcus, Group A Screen (Direct): NEGATIVE

## 2016-05-20 NOTE — ED Triage Notes (Signed)
States body aches, fever, sore throat and headache for 2 days

## 2016-05-20 NOTE — ED Notes (Signed)
Pt states sore throat, cough, body aches headache behind L eye. Has been taking tylenol and motrin at home. Symptoms began last night. Alert and oriented x 4. Ambulatory. Temp at home 100. Nausea but no vomiting, diarrhea x 5. Denies blood in stool.

## 2016-05-20 NOTE — Discharge Instructions (Signed)
Please drink plenty of fluids, take Tylenol or ibuprofen as needed for fever or discomfort, as written on the box. He may also take loperamide/Imodium as needed for diarrhea, as written on the box. Please follow-up your primary care doctor in 2-3 days for recheck/reevaluation if he continued to have symptoms. Return to the emergency department for any acute worsening or acutely concerning symptoms.

## 2016-05-20 NOTE — ED Provider Notes (Signed)
[facility] Emergency Department Provider Note  Time seen: 5:14 PM  I have reviewed the triage vital signs and the nursing notes.   HISTORY  Chief Complaint Headache; Generalized Body Aches; and Sore Throat    HPI Kristy Reid is a 21 y.o. female presents to the emergency department with complaints of diarrhea, cough, chills, congestion and low-grade fever since yesterday. According to the patient yesterday she developed diarrhea with a mild cough and low-grade fevers of 99 at home. Denies any abdominal pain or chest pain. States occasional cough but denies sputum production. States chills but is only measured a low-grade fever. Also states a moderate sore throat. Patient is 2 weeks status post normal spontaneous vaginal delivery denies any abdominal pain continues to state mild spotting denies any vaginal discharge. Patient is here with her 8613-day-old child who has similar symptoms including a fever to 102 at home.  Past Medical History:  Diagnosis Date  . Anemia   . Anemia   . Anxiety   . Asthma   . Chronic back pain   . Depression   . Insomnia   . Personality disorder    "boarderline"  . PTSD (post-traumatic stress disorder)     Patient Active Problem List   Diagnosis Date Noted  . Normal labor 05/07/2016  . Labor and delivery, indication for care 04/19/2016  . Indication for care in labor and delivery, antepartum 04/06/2016  . Decreased fetal movement 03/22/2016  . Abdominal pain in pregnancy 01/21/2016  . Constipation during pregnancy in second trimester 12/26/2015  . Overdose 11/29/2015  . Pregnancy (I trimester) 11/15/2015  . Cannabis use disorder, severe, dependence (HCC) 11/15/2015  . Tobacco use disorder 11/15/2015  . Asthma 11/15/2015  . Borderline personality disorder 11/15/2015  . PTSD (post-traumatic stress disorder) 11/15/2015  . Severe recurrent major depression without psychotic features (HCC) 11/13/2015    Past Surgical  History:  Procedure Laterality Date  . BUNIONECTOMY Bilateral    feet  . wrist sugery      Prior to Admission medications   Medication Sig Start Date End Date Taking? Authorizing Provider  albuterol (PROVENTIL) (5 MG/ML) 0.5% nebulizer solution Take 2.5 mg by nebulization every 6 (six) hours as needed for wheezing or shortness of breath.    Historical Provider, MD  cyclobenzaprine (FLEXERIL) 5 MG tablet Take 5 mg by mouth 3 (three) times daily as needed for muscle spasms.    Historical Provider, MD  fluticasone (FLONASE) 50 MCG/ACT nasal spray Place 2 sprays into both nostrils 2 (two) times daily.    Historical Provider, MD  HYDROcodone-acetaminophen (NORCO) 5-325 MG tablet Take 1 tablet by mouth at bedtime as needed for moderate pain. 04/19/16   Conard NovakStephen D Jackson, MD  ibuprofen (ADVIL,MOTRIN) 600 MG tablet Take 1 tablet (600 mg total) by mouth every 6 (six) hours. 05/09/16   Conard NovakStephen D Jackson, MD  Prenatal Vit-Fe Fumarate-FA (MULTIVITAMIN-PRENATAL) 27-0.8 MG TABS tablet Take 1 tablet by mouth daily at 12 noon.    Historical Provider, MD  QUEtiapine (SEROQUEL) 50 MG tablet Take 50 mg by mouth at bedtime.    Historical Provider, MD    Allergies  Allergen Reactions  . Penicillins Hives    Has patient had a PCN reaction causing immediate rash, facial/tongue/throat swelling, SOB or lightheadedness with hypotension: No Has patient had a PCN reaction causing severe rash involving mucus membranes or skin necrosis: No Has patient had a PCN reaction that required hospitalization No Has patient had a PCN reaction occurring  within the last 10 years: No If all of the above answers are "NO", then may proceed with Cephalosporin use.  . Rocephin [Ceftriaxone] Hives  . Zithromax [Azithromycin] Itching and Nausea And Vomiting  . Lidocaine Hives, Itching and Rash    Family History  Problem Relation Age of Onset  . Diabetes Mother   . Diabetes Maternal Grandmother   . Heart disease Maternal Grandmother    . Cancer Neg Hx   . Ovarian cancer Neg Hx     Social History Social History  Substance Use Topics  . Smoking status: Former Smoker    Types: Cigarettes    Quit date: 08/13/2015  . Smokeless tobacco: Never Used  . Alcohol use No    Review of Systems Constitutional: Positive for chills, low-grade fever. Positive for congestion and sore throat. Cardiovascular: Negative for chest pain. Respiratory: Negative for shortness of breath. Positive for cough Gastrointestinal: Negative for abdominal pain. States some nausea but denies vomiting. States moderate diarrhea. Genitourinary: Negative for dysuria. Vaginal spotting, denies discharge. Neurological: Negative for headache 10-point ROS otherwise negative.  ____________________________________________   PHYSICAL EXAM:  VITAL SIGNS: ED Triage Vitals  Enc Vitals Group     BP 05/20/16 1619 130/81     Pulse Rate 05/20/16 1619 94     Resp 05/20/16 1619 18     Temp 05/20/16 1619 98.4 F (36.9 C)     Temp Source 05/20/16 1619 Oral     SpO2 05/20/16 1619 97 %     Weight 05/20/16 1620 185 lb (83.9 kg)     Height 05/20/16 1620 5\' 2"  (1.575 m)     Head Circumference --      Peak Flow --      Pain Score 05/20/16 1620 8     Pain Loc --      Pain Edu? --      Excl. in GC? --     Constitutional: Alert and oriented. Well appearing and in no distress. Eyes: Normal exam ENT   Head: Normocephalic and atraumatic   Mouth/Throat: Mucous membranes are moist.Mild frontal erythema with no obvious tonsillar hypertrophy, no exudate. Cardiovascular: Normal rate, regular rhythm. No murmur Respiratory: Normal respiratory effort without tachypnea nor retractions. Breath sounds are clear, occasional cough during exam. Gastrointestinal: Soft and nontender. No distention. Musculoskeletal: Nontender with normal range of motion in all extremities.  Neurologic:  Normal speech and language. No gross focal neurologic deficits Skin:  Skin is warm, dry  and intact.  Psychiatric: Mood and affect are normal.   ____________________________________________    INITIAL IMPRESSION / ASSESSMENT AND PLAN / ED COURSE  Pertinent labs & imaging results that were available during my care of the patient were reviewed by me and considered in my medical decision making (see chart for details).  The patient presents to the emergency department with cough, sore throat, low-grade fever, chills and diarrhea. Patient is to weeks status post NSVD. Nontender abdomen. Denies discharge. Patient's symptoms are very suggestive of an upper respiratory infection such as influenza. We'll obtain a strep swab, influenza swab, basic labs and closely monitor.  Patient's workup is largely negative. Influenza negative. Strep negative. Highly suspect viral illness. Patient will be discharged home with supportive care.  ____________________________________________   FINAL CLINICAL IMPRESSION(S) / ED DIAGNOSES  Upper respiratory infection Viral illness   Minna Antis, MD 05/20/16 1844

## 2016-05-25 ENCOUNTER — Ambulatory Visit (INDEPENDENT_AMBULATORY_CARE_PROVIDER_SITE_OTHER): Payer: Medicaid Other | Admitting: Obstetrics and Gynecology

## 2016-05-25 DIAGNOSIS — Z30013 Encounter for initial prescription of injectable contraceptive: Secondary | ICD-10-CM

## 2016-05-25 MED ORDER — MEDROXYPROGESTERONE ACETATE 150 MG/ML IM SUSP: 150.0000 mg | INTRAMUSCULAR | 3 refills | Status: DC

## 2016-05-25 NOTE — Progress Notes (Signed)
Patient here and note is in prior system. BUt to prescribed contraception, using EPIC.

## 2016-06-04 ENCOUNTER — Ambulatory Visit: Payer: Medicaid Other

## 2016-06-04 ENCOUNTER — Ambulatory Visit (INDEPENDENT_AMBULATORY_CARE_PROVIDER_SITE_OTHER): Payer: Medicaid Other

## 2016-06-04 DIAGNOSIS — Z3042 Encounter for surveillance of injectable contraceptive: Secondary | ICD-10-CM

## 2016-06-04 DIAGNOSIS — Z308 Encounter for other contraceptive management: Secondary | ICD-10-CM

## 2016-06-04 MED ORDER — MEDROXYPROGESTERONE ACETATE 150 MG/ML IM SUSP
150.0000 mg | Freq: Once | INTRAMUSCULAR | Status: AC
  Administered 2016-06-04: 150 mg via INTRAMUSCULAR

## 2016-06-04 NOTE — Progress Notes (Signed)
Depo given IM right hip.

## 2016-06-09 ENCOUNTER — Telehealth: Payer: Self-pay

## 2016-06-09 NOTE — Telephone Encounter (Signed)
Pt called stating pharm is trying to get in touch c SDJ re a med that he rx'd.  They haven't heard from him.  Pt's # [phone].

## 2016-06-15 ENCOUNTER — Emergency Department
Admission: EM | Admit: 2016-06-15 | Discharge: 2016-06-15 | Disposition: A | Discharge status: Home / Self Care | Payer: Medicaid Other | Attending: Emergency Medicine | Admitting: Emergency Medicine

## 2016-06-15 ENCOUNTER — Emergency Department: Payer: Medicaid Other

## 2016-06-15 DIAGNOSIS — Z791 Long term (current) use of non-steroidal anti-inflammatories (NSAID): Secondary | ICD-10-CM | POA: Insufficient documentation

## 2016-06-15 DIAGNOSIS — J45909 Unspecified asthma, uncomplicated: Secondary | ICD-10-CM | POA: Insufficient documentation

## 2016-06-15 DIAGNOSIS — S8992XA Unspecified injury of left lower leg, initial encounter: Secondary | ICD-10-CM | POA: Diagnosis present

## 2016-06-15 DIAGNOSIS — Y9351 Activity, roller skating (inline) and skateboarding: Secondary | ICD-10-CM | POA: Insufficient documentation

## 2016-06-15 DIAGNOSIS — Y929 Unspecified place or not applicable: Secondary | ICD-10-CM | POA: Insufficient documentation

## 2016-06-15 DIAGNOSIS — X501XXA Overexertion from prolonged static or awkward postures, initial encounter: Secondary | ICD-10-CM | POA: Insufficient documentation

## 2016-06-15 DIAGNOSIS — S93402A Sprain of unspecified ligament of left ankle, initial encounter: Secondary | ICD-10-CM | POA: Diagnosis not present

## 2016-06-15 DIAGNOSIS — Z87891 Personal history of nicotine dependence: Secondary | ICD-10-CM | POA: Insufficient documentation

## 2016-06-15 DIAGNOSIS — Y999 Unspecified external cause status: Secondary | ICD-10-CM | POA: Diagnosis not present

## 2016-06-15 MED ORDER — TRAMADOL HCL 50 MG PO TABS: 50.0000 mg | ORAL_TABLET | Freq: Four times a day (QID) | ORAL | 0 refills | Status: DC | PRN

## 2016-06-15 MED ORDER — OXYCODONE-ACETAMINOPHEN 5-325 MG PO TABS
1.0000 | ORAL_TABLET | Freq: Once | ORAL | Status: AC
  Administered 2016-06-15: 1 via ORAL
  Filled 2016-06-15: qty 1

## 2016-06-15 NOTE — ED Triage Notes (Signed)
Pt to triage via wheelchair. Pt reports she twisted her left ankle on Saturday while roller skating. States ankle is now swollen and bruised. CMS intact.

## 2016-06-15 NOTE — ED Notes (Signed)
ED Provider at bedside. 

## 2016-06-15 NOTE — ED Notes (Signed)
Patient states that she was skating on Saturday and fell. Patient with pain and swelling to left ankle.

## 2016-06-15 NOTE — ED Provider Notes (Signed)
[facility] Emergency Department Provider Note   ____________________________________________   First MD Initiated Contact with Patient 06/15/16 [phone]     (approximate)  I have reviewed the triage vital signs and the nursing notes.   HISTORY  Chief Complaint Ankle Pain    HPI Kristy Reid is a 21 y.o. female who comes into the hospital today with some ankle pain. The patient reports that she twisted her ankle rollerblading on Saturday. She reports it is hurting and swollen. She did ice it and has been alternating between Tylenol and ibuprofen. The patient reports that she has been keeping it elevated. She rates her pain 8 out of 10 in intensity. She reports that she fell when she twisted it but didn't hit her head. She is concerned that she still having some pain so she decided to come into the hospital for evaluation.   Past Medical History:  Diagnosis Date  . Anemia   . Anemia   . Anxiety   . Asthma   . Chronic back pain   . Depression   . Insomnia   . Personality disorder    "boarderline"  . PTSD (post-traumatic stress disorder)     Patient Active Problem List   Diagnosis Date Noted  . Normal labor 05/07/2016  . Labor and delivery, indication for care 04/19/2016  . Indication for care in labor and delivery, antepartum 04/06/2016  . Decreased fetal movement 03/22/2016  . Abdominal pain in pregnancy 01/21/2016  . Constipation during pregnancy in second trimester 12/26/2015  . Overdose 11/29/2015  . Pregnancy (I trimester) 11/15/2015  . Cannabis use disorder, severe, dependence (HCC) 11/15/2015  . Tobacco use disorder 11/15/2015  . Asthma 11/15/2015  . Borderline personality disorder 11/15/2015  . PTSD (post-traumatic stress disorder) 11/15/2015  . Severe recurrent major depression without psychotic features (HCC) 11/13/2015    Past Surgical History:  Procedure Laterality Date  . BUNIONECTOMY Bilateral    feet  . wrist sugery       Prior to Admission medications   Medication Sig Start Date End Date Taking? Authorizing Provider  albuterol (PROVENTIL) (5 MG/ML) 0.5% nebulizer solution Take 2.5 mg by nebulization every 6 (six) hours as needed for wheezing or shortness of breath.    Historical Provider, MD  cyclobenzaprine (FLEXERIL) 5 MG tablet Take 5 mg by mouth 3 (three) times daily as needed for muscle spasms.    Historical Provider, MD  fluticasone (FLONASE) 50 MCG/ACT nasal spray Place 2 sprays into both nostrils 2 (two) times daily.    Historical Provider, MD  HYDROcodone-acetaminophen (NORCO) 5-325 MG tablet Take 1 tablet by mouth at bedtime as needed for moderate pain. 04/19/16   Conard Novak, MD  ibuprofen (ADVIL,MOTRIN) 600 MG tablet Take 1 tablet (600 mg total) by mouth every 6 (six) hours. 05/09/16   Conard Novak, MD  Prenatal Vit-Fe Fumarate-FA (MULTIVITAMIN-PRENATAL) 27-0.8 MG TABS tablet Take 1 tablet by mouth daily at 12 noon.    Historical Provider, MD  QUEtiapine (SEROQUEL) 50 MG tablet Take 50 mg by mouth at bedtime.    Historical Provider, MD  traMADol (ULTRAM) 50 MG tablet Take 1 tablet (50 mg total) by mouth every 6 (six) hours as needed. 06/15/16   Rebecka Apley, MD  traMADol (ULTRAM) 50 MG tablet Take 1 tablet (50 mg total) by mouth every 6 (six) hours as needed. 06/15/16   Rebecka Apley, MD    Allergies Penicillins; Rocephin [ceftriaxone]; Zithromax [azithromycin]; and Lidocaine  Family  History  Problem Relation Age of Onset  . Diabetes Mother   . Diabetes Maternal Grandmother   . Heart disease Maternal Grandmother   . Cancer Neg Hx   . Ovarian cancer Neg Hx     Social History Social History  Substance Use Topics  . Smoking status: Former Smoker    Types: Cigarettes    Quit date: 08/13/2015  . Smokeless tobacco: Never Used  . Alcohol use No    Review of Systems Constitutional: No fever/chills Eyes: No visual changes. ENT: No sore throat. Cardiovascular: Denies chest  pain. Respiratory: Denies shortness of breath. Gastrointestinal: No abdominal pain.  No nausea, no vomiting.  No diarrhea.  No constipation. Genitourinary: Negative for dysuria. Musculoskeletal: Left ankle pain Skin: Negative for rash. Neurological: Negative for headaches, focal weakness or numbness.  10-point ROS otherwise negative.  ____________________________________________   PHYSICAL EXAM:  VITAL SIGNS: ED Triage Vitals  Enc Vitals Group     BP 06/15/16 0443 121/62     Pulse Rate 06/15/16 0443 89     Resp --      Temp 06/15/16 0443 98.5 F (36.9 C)     Temp Source 06/15/16 0443 Oral     SpO2 06/15/16 0443 100 %     Weight 06/15/16 0423 185 lb (83.9 kg)     Height 06/15/16 0423  (1.575 m)     Head Circumference --      Peak Flow --      Pain Score 06/15/16 0422 8     Pain Loc --      Pain Edu? --      Excl. in GC? --     Constitutional: Alert and oriented. Well appearing and in Mild distress. Eyes: Conjunctivae are normal. PERRL. EOMI. Head: Atraumatic. Nose: No congestion/rhinnorhea. Mouth/Throat: Mucous membranes are moist.  Oropharynx non-erythematous. Cardiovascular: Normal rate, regular rhythm. Grossly normal heart sounds.  Good peripheral circulation. Respiratory: Normal respiratory effort.  No retractions. Lungs CTAB. Gastrointestinal: Soft and nontender. No distention. Positive bowel sounds Musculoskeletal: Left medial ankle tenderness to palpation with some mild swelling and no bruising noted. Neurologic:  Normal speech and language.  Skin:  Skin is warm, dry and intact.  Psychiatric: Mood and affect are normal.   ____________________________________________   LABS (all labs ordered are listed, but only abnormal results are displayed)  Labs Reviewed - No data to display ____________________________________________  EKG  none ____________________________________________  RADIOLOGY  Left ankle  xray ____________________________________________   PROCEDURES  Procedure(s) performed: None  Procedures  Critical Care performed: No  ____________________________________________   INITIAL IMPRESSION / ASSESSMENT AND PLAN / ED COURSE  Pertinent labs & imaging results that were available during my care of the patient were reviewed by me and considered in my medical decision making (see chart for details).  This is a 21 year old female who comes into the hospital today with some ankle pain. The patient twisted her ankle. She had an x-ray which was negative. I will place a stirrup Velcro splint on the patient's ankle and give her some crutches. I'll give the patient a dose of Percocet and she'll be discharged to follow-up with orthopedic surgery.  Clinical Course as of Jun 15 700  Mon Jun 15, 2016  1610 Negative. DG Ankle Complete Left [AW]    Clinical Course User Index [AW] Rebecka Apley, MD     ____________________________________________   FINAL CLINICAL IMPRESSION(S) / ED DIAGNOSES  Final diagnoses:  Sprain of left ankle, unspecified ligament, initial encounter  NEW MEDICATIONS STARTED DURING THIS VISIT:  New Prescriptions   TRAMADOL (ULTRAM) 50 MG TABLET    Take 1 tablet (50 mg total) by mouth every 6 (six) hours as needed.   TRAMADOL (ULTRAM) 50 MG TABLET    Take 1 tablet (50 mg total) by mouth every 6 (six) hours as needed.     Note:  This document was prepared using Dragon voice recognition software and may include unintentional dictation errors.    Rebecka Apley, MD 06/15/16 [phone]

## 2016-06-22 ENCOUNTER — Other Ambulatory Visit: Payer: Self-pay | Admitting: Obstetrics and Gynecology

## 2016-06-22 ENCOUNTER — Encounter: Payer: Medicaid Other | Admitting: Obstetrics and Gynecology

## 2016-06-22 DIAGNOSIS — Z113 Encounter for screening for infections with a predominantly sexual mode of transmission: Secondary | ICD-10-CM

## 2016-06-26 ENCOUNTER — Emergency Department
Admission: EM | Admit: 2016-06-26 | Discharge: 2016-06-26 | Disposition: A | Discharge status: Home / Self Care | Payer: Medicaid Other | Attending: Emergency Medicine | Admitting: Emergency Medicine

## 2016-06-26 ENCOUNTER — Encounter: Payer: Self-pay | Admitting: Emergency Medicine

## 2016-06-26 DIAGNOSIS — S8992XA Unspecified injury of left lower leg, initial encounter: Secondary | ICD-10-CM | POA: Diagnosis present

## 2016-06-26 DIAGNOSIS — S93402A Sprain of unspecified ligament of left ankle, initial encounter: Secondary | ICD-10-CM | POA: Diagnosis not present

## 2016-06-26 DIAGNOSIS — Y9389 Activity, other specified: Secondary | ICD-10-CM | POA: Insufficient documentation

## 2016-06-26 DIAGNOSIS — J45909 Unspecified asthma, uncomplicated: Secondary | ICD-10-CM | POA: Diagnosis not present

## 2016-06-26 DIAGNOSIS — Y929 Unspecified place or not applicable: Secondary | ICD-10-CM | POA: Insufficient documentation

## 2016-06-26 DIAGNOSIS — Z87891 Personal history of nicotine dependence: Secondary | ICD-10-CM | POA: Insufficient documentation

## 2016-06-26 DIAGNOSIS — Y999 Unspecified external cause status: Secondary | ICD-10-CM | POA: Diagnosis not present

## 2016-06-26 DIAGNOSIS — Z791 Long term (current) use of non-steroidal anti-inflammatories (NSAID): Secondary | ICD-10-CM | POA: Diagnosis not present

## 2016-06-26 DIAGNOSIS — Z79899 Other long term (current) drug therapy: Secondary | ICD-10-CM | POA: Diagnosis not present

## 2016-06-26 DIAGNOSIS — W1840XA Slipping, tripping and stumbling without falling, unspecified, initial encounter: Secondary | ICD-10-CM | POA: Insufficient documentation

## 2016-06-26 MED ORDER — TRAMADOL HCL 50 MG PO TABS: 50.0000 mg | ORAL_TABLET | Freq: Four times a day (QID) | ORAL | 0 refills | Status: DC | PRN

## 2016-06-26 MED ORDER — NAPROXEN 500 MG PO TABS: 500.0000 mg | ORAL_TABLET | Freq: Two times a day (BID) | ORAL | Status: DC

## 2016-06-26 NOTE — ED Provider Notes (Signed)
[facility] Emergency Department Provider Note   ____________________________________________   First MD Initiated Contact with Patient 06/26/16 1509     (approximate)  I have reviewed the triage vital signs and the nursing notes.   HISTORY  Chief Complaint Ankle Pain    HPI Kristy Reid is a 21 y.o. female patient complaining of reinjury of the left ankle 2 days ago.Patient was seen 11 days ago for left ankle sprain. She stated was doing well and had discontinued using the splint on the advice for her PCP. States that she was coming down the steps and tripped over her cat. Complained of pain to the medial aspect of the ankle. Patient rates the pain as 8/10. Patient describes pain as "achy". No palliative measures for his complaint.   Past Medical History:  Diagnosis Date  . Anemia   . Anemia   . Anxiety   . Asthma   . Chronic back pain   . Depression   . Insomnia   . Personality disorder    "boarderline"  . PTSD (post-traumatic stress disorder)     Patient Active Problem List   Diagnosis Date Noted  . Normal labor 05/07/2016  . Labor and delivery, indication for care 04/19/2016  . Indication for care in labor and delivery, antepartum 04/06/2016  . Decreased fetal movement 03/22/2016  . Abdominal pain in pregnancy 01/21/2016  . Constipation during pregnancy in second trimester 12/26/2015  . Overdose 11/29/2015  . Pregnancy (I trimester) 11/15/2015  . Cannabis use disorder, severe, dependence (HCC) 11/15/2015  . Tobacco use disorder 11/15/2015  . Asthma 11/15/2015  . Borderline personality disorder 11/15/2015  . PTSD (post-traumatic stress disorder) 11/15/2015  . Severe recurrent major depression without psychotic features (HCC) 11/13/2015    Past Surgical History:  Procedure Laterality Date  . BUNIONECTOMY Bilateral    feet  . wrist sugery      Prior to Admission medications   Medication Sig Start Date End Date Taking?  Authorizing Provider  albuterol (PROVENTIL) (5 MG/ML) 0.5% nebulizer solution Take 2.5 mg by nebulization every 6 (six) hours as needed for wheezing or shortness of breath.    Historical Provider, MD  cyclobenzaprine (FLEXERIL) 5 MG tablet Take 5 mg by mouth 3 (three) times daily as needed for muscle spasms.    Historical Provider, MD  fluticasone (FLONASE) 50 MCG/ACT nasal spray Place 2 sprays into both nostrils 2 (two) times daily.    Historical Provider, MD  HYDROcodone-acetaminophen (NORCO) 5-325 MG tablet Take 1 tablet by mouth at bedtime as needed for moderate pain. 04/19/16   Conard Novak, MD  ibuprofen (ADVIL,MOTRIN) 600 MG tablet Take 1 tablet (600 mg total) by mouth every 6 (six) hours. 05/09/16   Conard Novak, MD  naproxen (NAPROSYN) 500 MG tablet Take 1 tablet (500 mg total) by mouth 2 (two) times daily with a meal. 06/26/16   Joni Reining, PA-C  Prenatal Vit-Fe Fumarate-FA (MULTIVITAMIN-PRENATAL) 27-0.8 MG TABS tablet Take 1 tablet by mouth daily at 12 noon.    Historical Provider, MD  QUEtiapine (SEROQUEL) 50 MG tablet Take 50 mg by mouth at bedtime.    Historical Provider, MD  traMADol (ULTRAM) 50 MG tablet Take 1 tablet (50 mg total) by mouth every 6 (six) hours as needed. 06/15/16   Rebecka Apley, MD  traMADol (ULTRAM) 50 MG tablet Take 1 tablet (50 mg total) by mouth every 6 (six) hours as needed. 06/15/16   Rebecka Apley, MD  traMADol (ULTRAM) 50 MG tablet Take 1 tablet (50 mg total) by mouth every 6 (six) hours as needed for moderate pain. 06/26/16   Joni Reining, PA-C    Allergies Penicillins; Rocephin [ceftriaxone]; Zithromax [azithromycin]; and Lidocaine  Family History  Problem Relation Age of Onset  . Diabetes Mother   . Diabetes Maternal Grandmother   . Heart disease Maternal Grandmother   . Cancer Neg Hx   . Ovarian cancer Neg Hx     Social History Social History  Substance Use Topics  . Smoking status: Former Smoker    Types: Cigarettes    Quit  date: 08/13/2015  . Smokeless tobacco: Never Used  . Alcohol use No    Review of Systems Constitutional: No fever/chills Eyes: No visual changes. ENT: No sore throat. Cardiovascular: Denies chest pain. Respiratory: Denies shortness of breath. Gastrointestinal: No abdominal pain.  No nausea, no vomiting.  No diarrhea.  No constipation. Genitourinary: Negative for dysuria. Musculoskeletal: Chronic back pain  Skin: Negative for rash. Neurological: Negative for headaches, focal weakness or numbness. Psychiatric: PTSD, Anxiety and depression Allergic/Immunilogical: See  Medication list   ____________________________________________   PHYSICAL EXAM:  VITAL SIGNS: ED Triage Vitals  Enc Vitals Group     BP 06/26/16 1445 113/82     Pulse Rate 06/26/16 1445 92     Resp 06/26/16 1445 17     Temp 06/26/16 1445 98.2 F (36.8 C)     Temp Source 06/26/16 1445 Oral     SpO2 06/26/16 1445 99 %     Weight 06/26/16 1439 185 lb (83.9 kg)     Height --      Head Circumference --      Peak Flow --      Pain Score 06/26/16 1439 8     Pain Loc --      Pain Edu? --      Excl. in GC? --     Constitutional: Alert and oriented. Well appearing and in no acute distress. Eyes: Conjunctivae are normal. PERRL. EOMI. Head: Atraumatic. Nose: No congestion/rhinnorhea. Mouth/Throat: Mucous membranes are moist.  Oropharynx non-erythematous. Neck: No stridor.  No cervical spine tenderness to palpation. Hematological/Lymphatic/Immunilogical: No cervical lymphadenopathy. Cardiovascular: Normal rate, regular rhythm. Grossly normal heart sounds.  Good peripheral circulation. Respiratory: Normal respiratory effort.  No retractions. Lungs CTAB. Gastrointestinal: Soft and nontender. No distention. No abdominal bruits. No CVA tenderness. Musculoskeletal: No DEFORMITY to the left ankle. No obvious abrasion, edema or erythema. Moderate guarding palpation of the inferior medial malleolus. Neurologic:  Normal  speech and language. No gross focal neurologic deficits are appreciated. No gait instability. Skin:  Skin is warm, dry and intact. No rash noted. Surgical scar consistent with history. Psychiatric: Mood and affect are normal. Speech and behavior are normal.  ____________________________________________   LABS (all labs ordered are listed, but only abnormal results are displayed)  Labs Reviewed - No data to display ____________________________________________  EKG   ____________________________________________  RADIOLOGY   ____________________________________________   PROCEDURES  Procedure(s) performed: None  Procedures  Critical Care performed: No  ____________________________________________   INITIAL IMPRESSION / ASSESSMENT AND PLAN / ED COURSE  Pertinent labs & imaging results that were available during my care of the patient were reviewed by me and considered in my medical decision making (see chart for details).  Reinjury of left ankle.      ____________________________________________   FINAL CLINICAL IMPRESSION(S) / ED DIAGNOSES  Final diagnoses:  Sprain of left ankle, unspecified ligament, initial encounter  Patient given discharge Instructions. Patient advised to reapply ankle stirrup splint. Patient given prescription for tramadol and naproxen. Patient given a work note. Patient advised follow-up family doctor condition persists.   NEW MEDICATIONS STARTED DURING THIS VISIT:  New Prescriptions   NAPROXEN (NAPROSYN) 500 MG TABLET    Take 1 tablet (500 mg total) by mouth 2 (two) times daily with a meal.   TRAMADOL (ULTRAM) 50 MG TABLET    Take 1 tablet (50 mg total) by mouth every 6 (six) hours as needed for moderate pain.     Note:  This document was prepared using Dragon voice recognition software and may include unintentional dictation errors.    Joni Reining, PA-C 06/26/16 1526    Merrily Brittle, MD 07/01/16 [phone]

## 2016-06-26 NOTE — ED Triage Notes (Signed)
Pt with ankle pain.

## 2016-07-16 ENCOUNTER — Emergency Department (EMERGENCY_DEPARTMENT_HOSPITAL)
Admission: EM | Admit: 2016-07-16 | Discharge: 2016-07-18 | Disposition: A | Discharge status: Other Acute Inpatient Hospital | Payer: Medicaid Other | Source: Home / Self Care | Attending: Emergency Medicine | Admitting: Emergency Medicine

## 2016-07-16 ENCOUNTER — Encounter: Payer: Self-pay | Admitting: Emergency Medicine

## 2016-07-16 DIAGNOSIS — F4310 Post-traumatic stress disorder, unspecified: Secondary | ICD-10-CM | POA: Diagnosis present

## 2016-07-16 DIAGNOSIS — R45851 Suicidal ideations: Secondary | ICD-10-CM

## 2016-07-16 DIAGNOSIS — J45909 Unspecified asthma, uncomplicated: Secondary | ICD-10-CM | POA: Diagnosis present

## 2016-07-16 DIAGNOSIS — F603 Borderline personality disorder: Secondary | ICD-10-CM | POA: Diagnosis present

## 2016-07-16 DIAGNOSIS — F333 Major depressive disorder, recurrent, severe with psychotic symptoms: Secondary | ICD-10-CM

## 2016-07-16 LAB — URINE DRUG SCREEN, QUALITATIVE (ARMC ONLY)
AMPHETAMINES, UR SCREEN: NOT DETECTED
BENZODIAZEPINE, UR SCRN: NOT DETECTED
Barbiturates, Ur Screen: NOT DETECTED
CANNABINOID 50 NG, UR ~~LOC~~: POSITIVE — AB
Cocaine Metabolite,Ur ~~LOC~~: NOT DETECTED
MDMA (ECSTASY) UR SCREEN: NOT DETECTED
Methadone Scn, Ur: NOT DETECTED
Opiate, Ur Screen: NOT DETECTED
Phencyclidine (PCP) Ur S: NOT DETECTED
TRICYCLIC, UR SCREEN: NOT DETECTED

## 2016-07-16 LAB — CBC
HEMATOCRIT: 35.6 % (ref 35.0–47.0)
Hemoglobin: 11.5 g/dL — ABNORMAL LOW (ref 12.0–16.0)
MCH: 25.7 pg — ABNORMAL LOW (ref 26.0–34.0)
MCHC: 32.3 g/dL (ref 32.0–36.0)
MCV: 79.7 fL — AB (ref 80.0–100.0)
PLATELETS: 300 10*3/uL (ref 150–440)
RBC: 4.46 MIL/uL (ref 3.80–5.20)
RDW: 19.9 % — AB (ref 11.5–14.5)
WBC: 8.1 10*3/uL (ref 3.6–11.0)

## 2016-07-16 LAB — COMPREHENSIVE METABOLIC PANEL
ALBUMIN: 4.3 g/dL (ref 3.5–5.0)
ALT: 37 U/L (ref 14–54)
ANION GAP: 10 (ref 5–15)
AST: 35 U/L (ref 15–41)
Alkaline Phosphatase: 92 U/L (ref 38–126)
BUN: 8 mg/dL (ref 6–20)
CHLORIDE: 109 mmol/L (ref 101–111)
CO2: 21 mmol/L — AB (ref 22–32)
Calcium: 9.3 mg/dL (ref 8.9–10.3)
Creatinine, Ser: 1.02 mg/dL — ABNORMAL HIGH (ref 0.44–1.00)
GFR calc Af Amer: >60 mL/min (ref >60)
GFR calc non Af Amer: >60 mL/min (ref >60)
GLUCOSE: 89 mg/dL (ref 65–99)
POTASSIUM: 3.2 mmol/L — AB (ref 3.5–5.1)
SODIUM: 140 mmol/L (ref 135–145)
TOTAL PROTEIN: 7.9 g/dL (ref 6.5–8.1)
Total Bilirubin: 0.4 mg/dL (ref 0.3–1.2)

## 2016-07-16 LAB — ACETAMINOPHEN LEVEL

## 2016-07-16 LAB — SALICYLATE LEVEL: Salicylate Lvl: <7 mg/dL (ref 2.8–30.0)

## 2016-07-16 LAB — POCT PREGNANCY, URINE: PREG TEST UR: NEGATIVE

## 2016-07-16 LAB — ETHANOL: Alcohol, Ethyl (B): <5 mg/dL (ref <5)

## 2016-07-16 MED ORDER — LORATADINE 10 MG PO TABS
10.0000 mg | ORAL_TABLET | Freq: Once | ORAL | Status: AC
  Administered 2016-07-16: 10 mg via ORAL
  Filled 2016-07-16: qty 1

## 2016-07-16 MED ORDER — QUETIAPINE FUMARATE 25 MG PO TABS
50.0000 mg | ORAL_TABLET | Freq: Every day | ORAL | Status: DC
Start: 2016-07-16 — End: 2016-07-17
  Administered 2016-07-17: 50 mg via ORAL

## 2016-07-16 MED ORDER — POTASSIUM CHLORIDE 20 MEQ PO PACK
40.0000 meq | PACK | Freq: Once | ORAL | Status: AC
  Administered 2016-07-16: 40 meq via ORAL
  Filled 2016-07-16: qty 2

## 2016-07-16 NOTE — ED Notes (Signed)
Pt given phone to call family for status

## 2016-07-16 NOTE — ED Notes (Signed)
Pt has in her belongings, 1 bra, 1 tank top,  1 pair of pants, 1 pair of slip on shoes, 1 hair bow , 1 lip balm, 2 rings with clear stones, 1 cell phone

## 2016-07-16 NOTE — ED Provider Notes (Signed)
[facility] Emergency Department Provider Note  ____________________________________________  Time seen: Approximately 10:25 PM  I have reviewed the triage vital signs and the nursing notes.   HISTORY  Chief Complaint Suicidal    HPI Kristy Reid is a 21 y.o. female who complains of suicidal ideation with a plan to kill herself by overdose. She reports that she recently gave birth 2 months ago. Because she was concerned about the effects of her antidepressants on the pregnancy, she discontinued her antidepressants about 5 months ago when she was starting her third trimester. After the birth, she is bottle feeding, but has not restarted her medications. Denies homicidal ideation or any thoughts about hurting the baby. She does state that she has not slept in a long time and she feels very stressed. Reports that she hears a voice saying her name over and over again. These issues have been constant for her over the last several days, gradually worsening. No aggravating or alleviating factors.     Past Medical History:  Diagnosis Date  . Anemia   . Anemia   . Anxiety   . Asthma   . Chronic back pain   . Depression   . Insomnia   . Personality disorder    "boarderline"  . PTSD (post-traumatic stress disorder)      Patient Active Problem List   Diagnosis Date Noted  . Normal labor 05/07/2016  . Labor and delivery, indication for care 04/19/2016  . Indication for care in labor and delivery, antepartum 04/06/2016  . Decreased fetal movement 03/22/2016  . Abdominal pain in pregnancy 01/21/2016  . Constipation during pregnancy in second trimester 12/26/2015  . Overdose 11/29/2015  . Pregnancy (I trimester) 11/15/2015  . Cannabis use disorder, severe, dependence (HCC) 11/15/2015  . Tobacco use disorder 11/15/2015  . Asthma 11/15/2015  . Borderline personality disorder 11/15/2015  . PTSD (post-traumatic stress disorder) 11/15/2015  . Severe recurrent  major depression without psychotic features (HCC) 11/13/2015     Past Surgical History:  Procedure Laterality Date  . BUNIONECTOMY Bilateral    feet  . wrist sugery       Prior to Admission medications   Medication Sig Start Date End Date Taking? Authorizing Provider  albuterol (PROVENTIL) (5 MG/ML) 0.5% nebulizer solution Take 2.5 mg by nebulization every 6 (six) hours as needed for wheezing or shortness of breath.    Historical Provider, MD  cyclobenzaprine (FLEXERIL) 5 MG tablet Take 5 mg by mouth 3 (three) times daily as needed for muscle spasms.    Historical Provider, MD  fluticasone (FLONASE) 50 MCG/ACT nasal spray Place 2 sprays into both nostrils 2 (two) times daily.    Historical Provider, MD  HYDROcodone-acetaminophen (NORCO) 5-325 MG tablet Take 1 tablet by mouth at bedtime as needed for moderate pain. 04/19/16   Conard NovakStephen D Jackson, MD  ibuprofen (ADVIL,MOTRIN) 600 MG tablet Take 1 tablet (600 mg total) by mouth every 6 (six) hours. 05/09/16   Conard NovakStephen D Jackson, MD  naproxen (NAPROSYN) 500 MG tablet Take 1 tablet (500 mg total) by mouth 2 (two) times daily with a meal. 06/26/16   Joni Reiningonald K Smith, PA-C  Prenatal Vit-Fe Fumarate-FA (MULTIVITAMIN-PRENATAL) 27-0.8 MG TABS tablet Take 1 tablet by mouth daily at 12 noon.    Historical Provider, MD  QUEtiapine (SEROQUEL) 50 MG tablet Take 50 mg by mouth at bedtime.    Historical Provider, MD  traMADol (ULTRAM) 50 MG tablet Take 1 tablet (50 mg total) by mouth every 6 (  six) hours as needed. 06/15/16   Rebecka Apley, MD  traMADol (ULTRAM) 50 MG tablet Take 1 tablet (50 mg total) by mouth every 6 (six) hours as needed. 06/15/16   Rebecka Apley, MD  traMADol (ULTRAM) 50 MG tablet Take 1 tablet (50 mg total) by mouth every 6 (six) hours as needed for moderate pain. 06/26/16   Joni Reining, PA-C     Allergies Penicillins; Rocephin [ceftriaxone]; Zithromax [azithromycin]; and Lidocaine   Family History  Problem Relation Age of Onset  .  Diabetes Mother   . Diabetes Maternal Grandmother   . Heart disease Maternal Grandmother   . Cancer Neg Hx   . Ovarian cancer Neg Hx     Social History Social History  Substance Use Topics  . Smoking status: Current Every Day Smoker    Types: Cigarettes    Last attempt to quit: 08/13/2015  . Smokeless tobacco: Never Used  . Alcohol use No    Review of Systems  Constitutional:   No fever or chills.  ENT:   No sore throat. No rhinorrhea. Lymphatic: No swollen glands, No extremity swelling Endocrine: No hot/cold flashes. No significant weight change. No neck swelling. Cardiovascular:   No chest pain or syncope. Respiratory:   No dyspnea or cough. Gastrointestinal:   Chronic abdominal pain from Crohn's disease.  Genitourinary:   Negative for dysuria or difficulty urinating. Musculoskeletal:   Negative for focal pain or swelling Neurological:   Negative for headaches or weakness. All other systems reviewed and are negative except as documented above in ROS and HPI.  ____________________________________________   PHYSICAL EXAM:  VITAL SIGNS: ED Triage Vitals  Enc Vitals Group     BP 07/16/16 1946 136/82     Pulse Rate 07/16/16 1946 94     Resp 07/16/16 1946 18     Temp 07/16/16 1946 98.7 F (37.1 C)     Temp Source 07/16/16 1946 Oral     SpO2 07/16/16 1946 100 %     Weight 07/16/16 1946 165 lb (74.8 kg)     Height 07/16/16 1946 5\' 2"  (1.575 m)     Head Circumference --      Peak Flow --      Pain Score 07/16/16 1945 6     Pain Loc --      Pain Edu? --      Excl. in GC? --     Vital signs reviewed, nursing assessments reviewed.   Constitutional:   Alert and oriented. Well appearing and in no distress. Eyes:   No scleral icterus. No conjunctival pallor. PERRL. EOMI.  No nystagmus. ENT   Head:   Normocephalic and atraumatic.   Nose:   No congestion/rhinnorhea. No septal hematoma   Mouth/Throat:   MMM, no pharyngeal erythema. No peritonsillar mass.     Neck:   No stridor. No SubQ emphysema. No meningismus. Hematological/Lymphatic/Immunilogical:   No cervical lymphadenopathy. Cardiovascular:   RRR. Symmetric bilateral radial and DP pulses.  No murmurs.  Respiratory:   Normal respiratory effort without tachypnea nor retractions. Breath sounds are clear and equal bilaterally. No wheezes/rales/rhonchi. Gastrointestinal:   Soft Without focal tenderness. Non distended. There is no CVA tenderness.  No rebound, rigidity, or guarding. Genitourinary:   deferred Musculoskeletal:   Normal range of motion in all extremities. No joint effusions.  No lower extremity tenderness.  No edema. Neurologic:   Normal speech and language.  CN 2-10 normal. Motor grossly intact. No gross focal neurologic deficits are  appreciated.  Skin:    Skin is warm, dry and intact. No rash noted.  No petechiae, purpura, or bullae.  ____________________________________________    LABS (pertinent positives/negatives) (all labs ordered are listed, but only abnormal results are displayed) Labs Reviewed  COMPREHENSIVE METABOLIC PANEL - Abnormal; Notable for the following:       Result Value   Potassium 3.2 (*)    CO2 21 (*)    Creatinine, Ser 1.02 (*)    All other components within normal limits  ACETAMINOPHEN LEVEL - Abnormal; Notable for the following:    Acetaminophen (Tylenol), Serum <10 (*)    All other components within normal limits  CBC - Abnormal; Notable for the following:    Hemoglobin 11.5 (*)    MCV 79.7 (*)    MCH 25.7 (*)    RDW 19.9 (*)    All other components within normal limits  URINE DRUG SCREEN, QUALITATIVE ([facility] ONLY) - Abnormal; Notable for the following:    Cannabinoid 50 Ng, Ur [place] POSITIVE (*)    All other components within normal limits  ETHANOL  SALICYLATE LEVEL  POC URINE PREG, ED  POCT PREGNANCY, URINE   ____________________________________________   EKG    ____________________________________________    RADIOLOGY  No  results found.  ____________________________________________   PROCEDURES Procedures  ____________________________________________   INITIAL IMPRESSION / ASSESSMENT AND PLAN / ED COURSE  Pertinent labs & imaging results that were available during my care of the patient were reviewed by me and considered in my medical decision making (see chart for details).  Patient well appearing no acute distress, normal vital signs, medically clear. Presents with suicidal ideation, some auditory hallucinations, plan to overdose and kill herself, noncompliant with her antidepressants. I have initiated involuntary commitment for her safety tending psychiatry evaluation. Baby is currently in the care of her cousin.         ____________________________________________   FINAL CLINICAL IMPRESSION(S) / ED DIAGNOSES  Final diagnoses:  Severe episode of recurrent major depressive disorder, with psychotic features (HCC)  Suicidal ideation      New Prescriptions   No medications on file     Portions of this note were generated with dragon dictation software. Dictation errors may occur despite best attempts at proofreading.    Sharman Cheek, MD 07/16/16 2228

## 2016-07-16 NOTE — BH Assessment (Signed)
Assessment Note  Kristy Reid is an 21 y.o. female, with a history of depression, PTSD and personality disorder, presenting to the ED with suicidal ideations with a plan to overdose on pills. Pt reports she gave birth 2 months ago and feels like she is suffering from post partum depression.  She reports discontinuing her antidepressants while pregnant and has not resumed taking them.  She says she has no intention of following through with suicide because she has the 36 month old baby and a 52 year old son.  She says she knows she has a lot to live for but states "I can't shake these feelings".    Pt also endorses auditory hallucinations in which she hears a voice repeatedly saying her name.  She denies any visual hallucinations.  She denies regular drug/alcohol use.  UDS was positive for cannabis.  Pt reports she is not interested in inpatient hospitalization or talking to a therapist on a regular basis.  She says she does talk to a behavior specialist at her primary care providers office and states she will follow up with her.  Diagnosis: Post Partum Depression  Past Medical History:  Past Medical History:  Diagnosis Date  . Anemia   . Anemia   . Anxiety   . Asthma   . Chronic back pain   . Depression   . Insomnia   . Personality disorder    "boarderline"  . PTSD (post-traumatic stress disorder)     Past Surgical History:  Procedure Laterality Date  . BUNIONECTOMY Bilateral    feet  . wrist sugery      Family History:  Family History  Problem Relation Age of Onset  . Diabetes Mother   . Diabetes Maternal Grandmother   . Heart disease Maternal Grandmother   . Cancer Neg Hx   . Ovarian cancer Neg Hx     Social History:  reports that she has been smoking Cigarettes.  She has never used smokeless tobacco. She reports that she uses drugs, including Marijuana, about 1 time per week. She reports that she does not drink alcohol.  Additional Social History:  Alcohol / Drug  Use Pain Medications: See PTA Prescriptions: See PTA Over the Counter: See PTA History of alcohol / drug use?: No history of alcohol / drug abuse (Pt denies)  CIWA: CIWA-Ar BP: 136/82 Pulse Rate: 94 COWS:    Allergies:  Allergies  Allergen Reactions  . Penicillins Hives    Has patient had a PCN reaction causing immediate rash, facial/tongue/throat swelling, SOB or lightheadedness with hypotension: No Has patient had a PCN reaction causing severe rash involving mucus membranes or skin necrosis: No Has patient had a PCN reaction that required hospitalization No Has patient had a PCN reaction occurring within the last 10 years: No If all of the above answers are "NO", then may proceed with Cephalosporin use.  . Rocephin [Ceftriaxone] Hives  . Zithromax [Azithromycin] Itching and Nausea And Vomiting  . Lidocaine Hives, Itching and Rash    Home Medications:  (Not in a hospital admission)  OB/GYN Status:  No LMP recorded.  General Assessment Data Location of Assessment: [facility] ED TTS Assessment: In system Is this a Tele or Face-to-Face Assessment?: Face-to-Face Is this an Initial Assessment or a Re-assessment for this encounter?: Initial Assessment Marital status: Single Maiden name: na Is patient pregnant?: No Pregnancy Status: No Living Arrangements: Other relatives Can pt return to current living arrangement?: Yes Admission Status: Voluntary Is patient capable of signing voluntary  admission?: Yes Referral Source: Psychiatrist Insurance type: Medicaid  Medical Screening Exam [facility] Walk-in ONLY) Medical Exam completed: Yes  Crisis Care Plan Living Arrangements: Other relatives Legal Guardian: Other: (self) Name of Psychiatrist: [facility] Name of Therapist: [facility]  Education Status Is patient currently in school?: No Current Grade: na Highest grade of school patient has completed: 10 Name of school: na Contact person: na  Risk to self with the past 6 months Suicidal  Ideation: Yes-Currently Present Has patient been a risk to self within the past 6 months prior to admission? : No Suicidal Intent: No Has patient had any suicidal intent within the past 6 months prior to admission? : No Is patient at risk for suicide?: Yes Suicidal Plan?: Yes-Currently Present Has patient had any suicidal plan within the past 6 months prior to admission? : No Specify Current Suicidal Plan: Pt reports plan to OD on pills Access to Means: Yes Specify Access to Suicidal Means: Pt has access to pills What has been your use of drugs/alcohol within the last 12 months?: Cannabis Previous Attempts/Gestures: Yes How many times?: 1 Other Self Harm Risks: None identified Triggers for Past Attempts: Other (Comment) Intentional Self Injurious Behavior: None Family Suicide History: No Recent stressful life event(s): Financial Problems, Conflict (Comment), Other (Comment) Persecutory voices/beliefs?: No Depression: Yes Depression Symptoms: Insomnia, Isolating, Loss of interest in usual pleasures, Feeling worthless/self pity Substance abuse history and/or treatment for substance abuse?: No Suicide prevention information given to non-admitted patients: Not applicable  Risk to Others within the past 6 months Homicidal Ideation: No Does patient have any lifetime risk of violence toward others beyond the six months prior to admission? : No Thoughts of Harm to Others: No Current Homicidal Intent: No Current Homicidal Plan: No Access to Homicidal Means: No Identified Victim: None reported History of harm to others?: No Assessment of Violence: None Noted Violent Behavior Description: None identified Does patient have access to weapons?: No Criminal Charges Pending?: No Does patient have a court date: No Is patient on probation?: No  Psychosis Hallucinations: Auditory Delusions: None noted  Mental Status Report Appearance/Hygiene: In scrubs Eye Contact: Good Motor Activity:  Freedom of movement Speech: Logical/coherent Level of Consciousness: Alert Mood: Pleasant Affect: Appropriate to circumstance Anxiety Level: Minimal Thought Processes: Relevant Judgement: Unimpaired Orientation: Person, Place, Time, Situation Obsessive Compulsive Thoughts/Behaviors: None  Cognitive Functioning Concentration: Normal Memory: Recent Intact, Remote Intact IQ: Average Insight: Fair Impulse Control: Fair Appetite: Fair Weight Loss: 0 Weight Gain: 0 Sleep: Decreased Total Hours of Sleep: 2 Vegetative Symptoms: None  ADLScreening [facility] Assessment Services) Patient's cognitive ability adequate to safely complete daily activities?: Yes Patient able to express need for assistance with ADLs?: Yes Independently performs ADLs?: Yes (appropriate for developmental age)  Prior Inpatient Therapy Prior Inpatient Therapy: Yes Prior Therapy Dates: 11/2016 Prior Therapy Facilty/Provider(s): [facility] Reason for Treatment: depression  Prior Outpatient Therapy Prior Outpatient Therapy: Yes Prior Therapy Dates: current Prior Therapy Facilty/Provider(s): [facility] Reason for Treatment: depression Does patient have an ACCT team?: No Does patient have Intensive In-House Services?  : No Does patient have [facility] services? : No Does patient have P4CC services?: No  ADL Screening (condition at time of admission) Patient's cognitive ability adequate to safely complete daily activities?: Yes Patient able to express need for assistance with ADLs?: Yes Independently performs ADLs?: Yes (appropriate for developmental age)       Abuse/Neglect Assessment (Assessment to be complete while patient is alone) Physical Abuse: Denies Verbal Abuse: Denies Sexual Abuse: Denies Exploitation of  patient/patient's resources: Denies Self-Neglect: Denies Values / Beliefs Cultural Requests During Hospitalization: None Spiritual Requests During Hospitalization: None Consults Spiritual Care Consult  Needed: No Social Work Consult Needed: No Merchant navy officerAdvance Directives (For Healthcare) Does Patient Have a Medical Advance Directive?: No Would patient like information on creating a medical advance directive?: No - Patient declined    Additional Information 1:1 In Past 12 Months?: No CIRT Risk: No Elopement Risk: No Does patient have medical clearance?: Yes     Disposition:  Disposition Initial Assessment Completed for this Encounter: Yes Disposition of Patient: Other dispositions Other disposition(s): Other (Comment) (Pending Psych MD consult)  On Site Evaluation by:   Reviewed with Physician:    Artist Beachoxana C Clifton Safley 07/16/2016 10:43 PM

## 2016-07-16 NOTE — ED Triage Notes (Signed)
Pt ambulatory to triage in NAD, [facility].  Pt reports she has passive thoughts of overdose but states she would not do it because of her 64mo child at home.  Pt reports long hx of depression and post-partum depression w/ previous child.

## 2016-07-17 DIAGNOSIS — F333 Major depressive disorder, recurrent, severe with psychotic symptoms: Secondary | ICD-10-CM | POA: Diagnosis not present

## 2016-07-17 MED ORDER — QUETIAPINE FUMARATE 25 MG PO TABS
50.0000 mg | ORAL_TABLET | Freq: Three times a day (TID) | ORAL | Status: DC | PRN
  Administered 2016-07-17 – 2016-07-18 (×2): 50 mg via ORAL
  Filled 2016-07-17: qty 2

## 2016-07-17 MED ORDER — QUETIAPINE FUMARATE 25 MG PO TABS: 100.0000 mg | ORAL_TABLET | Freq: Every day | ORAL | Status: DC

## 2016-07-17 MED ORDER — QUETIAPINE FUMARATE 25 MG PO TABS
ORAL_TABLET | ORAL | Status: AC
  Administered 2016-07-17: 50 mg via ORAL
  Filled 2016-07-17: qty 2

## 2016-07-17 MED ORDER — QUETIAPINE FUMARATE 25 MG PO TABS
ORAL_TABLET | ORAL | Status: AC
  Filled 2016-07-17: qty 2

## 2016-07-17 MED ORDER — SERTRALINE HCL 100 MG PO TABS
100.0000 mg | ORAL_TABLET | Freq: Every day | ORAL | Status: DC
  Administered 2016-07-17 – 2016-07-18 (×2): 100 mg via ORAL
  Filled 2016-07-17 (×2): qty 1

## 2016-07-17 NOTE — ED Notes (Signed)
Patient resting quietly in room. No noted distress or abnormal behaviors noted. Will continue 15 minute checks and observation by security camera for safety. 

## 2016-07-17 NOTE — ED Notes (Signed)
RN placed call for pt to check on her [id number] year old son. Pt is calm. Maintained on 15 minute checks and observation by security camera for safety.

## 2016-07-17 NOTE — ED Notes (Signed)
Pt cousin Annice PihJackie called the unit. She could not give pass code for patient. No info given. Took number and let patient call.

## 2016-07-17 NOTE — ED Notes (Signed)
Pt gave RN verbal and written permission to give her cell phone to her cousin Morrie Sheldonshley.  Both the patient and her cousin Morrie Sheldonshley signed the belongings log on pt's chart in BHU.

## 2016-07-17 NOTE — ED Notes (Addendum)
Pt at the time of assessment was alert and oriented x4. Pt at the time endorsed moderate depression and suicidal thoughts; states, "I have a lot going on with me; my GM just had another stroke, my baby had seizure last week and I can't see my other child anymore because my GM lied to the court." Patient contacts for safety. Pt also complained AVH; states, "I see shadows and voices call my name; I hear them a lot but they get worse when I don't take my meds." Pt did not report any command hallucinations. Pt at the time also complained of acute L. ankle pain from a strain and some chest discomfort-Pt cleared at the ED. All Pt's questions addressed. Support, encouragement, and safe environment provided.  15-minute safety checks initiated and continued. Pt remained calm and cooperative through the admission process. Pt wand down by security. [facility]

## 2016-07-17 NOTE — ED Notes (Signed)
Patient refused snack.  

## 2016-07-17 NOTE — ED Notes (Signed)
Pt given breakfast tray. Pt appears sad, depressed.  Pt endorses SI. Denies HI. Pt states she still hears voices; "They never go away."  Pt told this writer her two children are with her cousin and are safe. Maintained on 15 minute checks and observation by security camera for safety.

## 2016-07-17 NOTE — ED Provider Notes (Signed)
-----------------------------------------   7:07 AM on 07/17/2016 -----------------------------------------   Blood pressure (!) 100/42, pulse 98, temperature 98.3 F (36.8 C), temperature source Oral, resp. rate 18, height 5\' 2"  (1.575 m), weight 165 lb (74.8 kg), SpO2 100 %, unknown if currently breastfeeding.  The patient had no acute events since last update.  Calm and cooperative at this time.  Disposition is pending Psychiatry/Behavioral Medicine team recommendations.     Irean HongJade J Jerra Huckeby, MD 07/17/16 [phone]

## 2016-07-17 NOTE — ED Notes (Signed)
Patient asleep in room. No noted distress or abnormal behavior. Will continue 15 minute checks and observation by security cameras for safety. 

## 2016-07-17 NOTE — ED Notes (Signed)
Pt given dinner tray. Pt resting in bed. Maintained on 15 minute checks and observation by security camera for safety.

## 2016-07-17 NOTE — ED Notes (Signed)
ED BHU PLACEMENT JUSTIFICATION Is the patient under IVC or is there intent for IVC: Yes.   Is the patient medically cleared: Yes.   Is there vacancy in the ED BHU: Yes.   Is the population mix appropriate for patient: Yes.   Is the patient awaiting placement in inpatient or outpatient setting: Yes.   Has the patient had a psychiatric consult: Yes.   Survey of unit performed for contraband, proper placement and condition of furniture, tampering with fixtures in bathroom, shower, and each patient room: Yes.  ; Findings: NA APPEARANCE/BEHAVIOR calm, cooperative and adequate rapport can be established NEURO ASSESSMENT Orientation: time, place and person Hallucinations: Yes.  Auditory Hallucinations and Visual Hallucinations Speech: Normal Gait: normal RESPIRATORY ASSESSMENT Normal expansion.  Clear to auscultation.  No rales, rhonchi, or wheezing. CARDIOVASCULAR ASSESSMENT regular rate and rhythm, S1, S2 normal, no murmur, click, rub or gallop GASTROINTESTINAL ASSESSMENT soft, nontender, BS WNL, no r/g EXTREMITIES normal strength, tone, and muscle mass PLAN OF CARE Provide calm/safe environment. Vital signs assessed twice daily. ED BHU Assessment once each 12-hour shift. Collaborate with intake RN daily or as condition indicates. Assure the ED provider has rounded once each shift. Provide and encourage hygiene. Provide redirection as needed. Assess for escalating behavior; address immediately and inform ED provider.  Assess family dynamic and appropriateness for visitation as needed: Yes.  ; If necessary, describe findings: NA Educate the patient/family about BHU procedures/visitation: Yes.  ; If necessary, describe findings: NA  

## 2016-07-17 NOTE — ED Notes (Signed)
Pt c/o increased hallucinations.  Psychiatrist made aware. PRN medication administered as ordered.

## 2016-07-17 NOTE — ED Notes (Signed)

## 2016-07-17 NOTE — Consult Note (Signed)
[facility] Consult   Reason for Consult:  Consult for 21 year old woman with a past history of depression Referring Physician:  Quentin Cornwall Patient Identification: MAVERICK PATMAN MRN:  [id number] Principal Diagnosis: Depression, major, recurrent, severe with psychosis ([place]) Diagnosis:   Patient Active Problem List   Diagnosis Date Noted  . Depression, major, recurrent, severe with psychosis ([place]) [F33.3] 07/17/2016  . Normal labor [O80, Z37.9] 05/07/2016  . Labor and delivery, indication for care [O75.9] 04/19/2016  . Indication for care in labor and delivery, antepartum [O75.9] 04/06/2016  . Decreased fetal movement [O36.8190] 03/22/2016  . Abdominal pain in pregnancy [O26.899, R10.9] 01/21/2016  . Constipation during pregnancy in second trimester [O99.612, K59.00] 12/26/2015  . Overdose [T50.901A] 11/29/2015  . Pregnancy (I trimester) [Z34.90] 11/15/2015  . Cannabis use disorder, severe, dependence ([place]) [F12.20] 11/15/2015  . Tobacco use disorder [F17.200] 11/15/2015  . Asthma [J45.909] 11/15/2015  . Borderline personality disorder [F60.3] 11/15/2015  . PTSD (post-traumatic stress disorder) [F43.10] 11/15/2015  . Severe recurrent major depression without psychotic features ([place]) [F33.2] 11/13/2015    Total Time spent with patient: 1 hour  Subjective:   Kristy Reid is a 21 y.o. female patient admitted with "I need to get back on my medicine, been real depressed".  HPI:  Patient interviewed. Chart reviewed. 21 year old woman was sent here from [place]. She had gone there to get back into psychiatric treatment. Patient reports that her mood has been very depressed for at least the past month maybe longer. Feels tired and run down all the time. Sleeps poorly at night. Feels negative about herself. Nervous most of the time. Has started to have suicidal thoughts with thoughts of overdosing on medicine. Patient admits that she is using marijuana multiple times a week and  occasionally alcohol although not other drugs. She has been off her psychiatric medicine since the late stages of her pregnancy which would be probably about 4 or 5 months ago. Doesn't report any other particular stress although having a young child at home has been difficult and she has been trying to continue to work. Patient reports she is also having auditory hallucinations hearing voices in and out during the day. No thoughts of hurting anyone else.  Medical history: No known medical problems. Past history of mild minor asthma.  Substance abuse history: Admits to frequent use of marijuana which is stable. Denies other drug use. Drug screen positive only for cannabis. No alcohol level on presentation.  Social history: Patient has a 47-monthold child. Lives with several family members. Has been trying to work at times. Does have social support and someone to take care of the child.  Past Psychiatric History: Patient has been seen before for severe major depression and treated with medication although she chose to stop medicine towards the end of her pregnancy. Positive past history of psychiatric hospitalization. No history of suicide attempts or violence. No previous psychosis. Prior medicines have been Seroquel and Zoloft.  Risk to Self: Suicidal Ideation: Yes-Currently Present Suicidal Intent: No Is patient at risk for suicide?: Yes Suicidal Plan?: Yes-Currently Present Specify Current Suicidal Plan: Pt reports plan to OD on pills Access to Means: Yes Specify Access to Suicidal Means: Pt has access to pills What has been your use of drugs/alcohol within the last 12 months?: Cannabis How many times?: 1 Other Self Harm Risks: None identified Triggers for Past Attempts: Other (Comment) Intentional Self Injurious Behavior: None Risk to Others: Homicidal Ideation: No Thoughts of Harm to Others: No Current  Homicidal Intent: No Current Homicidal Plan: No Access to Homicidal Means:  No Identified Victim: None reported History of harm to others?: No Assessment of Violence: None Noted Violent Behavior Description: None identified Does patient have access to weapons?: No Criminal Charges Pending?: No Does patient have a court date: No Prior Inpatient Therapy: Prior Inpatient Therapy: Yes Prior Therapy Dates: 11/2016 Prior Therapy Facilty/Provider(s): [facility] Reason for Treatment: depression Prior Outpatient Therapy: Prior Outpatient Therapy: Yes Prior Therapy Dates: current Prior Therapy Facilty/Provider(s): [facility] Reason for Treatment: depression Does patient have an ACCT team?: No Does patient have Intensive In-House Services?  : No Does patient have [facility] services? : No Does patient have P4CC services?: No  Past Medical History:  Past Medical History:  Diagnosis Date  . Anemia   . Anemia   . Anxiety   . Asthma   . Chronic back pain   . Depression   . Insomnia   . Personality disorder    "boarderline"  . PTSD (post-traumatic stress disorder)     Past Surgical History:  Procedure Laterality Date  . BUNIONECTOMY Bilateral    feet  . wrist sugery     Family History:  Family History  Problem Relation Age of Onset  . Diabetes Mother   . Diabetes Maternal Grandmother   . Heart disease Maternal Grandmother   . Cancer Neg Hx   . Ovarian cancer Neg Hx    Family Psychiatric  History: Positive for depression Social History:  History  Alcohol Use No     History  Drug Use  . Frequency: 1.0 time per week  . Types: Marijuana    Comment: last 2 days ago    Social History   Social History  . Marital status: Single    Spouse name: N/A  . Number of children: N/A  . Years of education: N/A   Social History Main Topics  . Smoking status: Current Every Day Smoker    Types: Cigarettes    Last attempt to quit: 08/13/2015  . Smokeless tobacco: Never Used  . Alcohol use No  . Drug use: Yes    Frequency: 1.0 time per week    Types: Marijuana      Comment: last 2 days ago  . Sexual activity: Yes    Birth control/ protection: Injection   Other Topics Concern  . None   Social History Narrative  . None   Additional Social History:    Allergies:   Allergies  Allergen Reactions  . Penicillins Hives    Has patient had a PCN reaction causing immediate rash, facial/tongue/throat swelling, SOB or lightheadedness with hypotension: No Has patient had a PCN reaction causing severe rash involving mucus membranes or skin necrosis: No Has patient had a PCN reaction that required hospitalization No Has patient had a PCN reaction occurring within the last 10 years: No If all of the above answers are "NO", then may proceed with Cephalosporin use.  . Rocephin [Ceftriaxone] Hives  . Zithromax [Azithromycin] Itching and Nausea And Vomiting  . Lidocaine Hives, Itching and Rash    Labs:  Results for orders placed or performed during the hospital encounter of 07/16/16 (from the past 48 hour(s))  Comprehensive metabolic panel     Status: Abnormal   Collection Time: 07/16/16  8:04 PM  Result Value Ref Range   Sodium 140 135 - 145 mmol/L   Potassium 3.2 (L) 3.5 - 5.1 mmol/L   Chloride 109 101 - 111 mmol/L   CO2 21 (L)  22 - 32 mmol/L   Glucose, Bld 89 65 - 99 mg/dL   BUN 8 6 - 20 mg/dL   Creatinine, Ser 1.02 (H) 0.44 - 1.00 mg/dL   Calcium 9.3 8.9 - 10.3 mg/dL   Total Protein 7.9 6.5 - 8.1 g/dL   Albumin 4.3 3.5 - 5.0 g/dL   AST 35 15 - 41 U/L   ALT 37 14 - 54 U/L   Alkaline Phosphatase 92 38 - 126 U/L   Total Bilirubin 0.4 0.3 - 1.2 mg/dL   GFR calc non Af Amer >60 >60 mL/min   GFR calc Af Amer >60 >60 mL/min    Comment: (NOTE) The eGFR has been calculated using the CKD EPI equation. This calculation has not been validated in all clinical situations. eGFR's persistently <60 mL/min signify possible Chronic Kidney Disease.    Anion gap 10 5 - 15  Ethanol     Status: None   Collection Time: 07/16/16  8:04 PM  Result Value Ref Range    Alcohol, Ethyl (B) <5 <5 mg/dL    Comment:        LOWEST DETECTABLE LIMIT FOR SERUM ALCOHOL IS 5 mg/dL FOR MEDICAL PURPOSES ONLY   Salicylate level     Status: None   Collection Time: 07/16/16  8:04 PM  Result Value Ref Range   Salicylate Lvl <1.1 2.8 - 30.0 mg/dL  Acetaminophen level     Status: Abnormal   Collection Time: 07/16/16  8:04 PM  Result Value Ref Range   Acetaminophen (Tylenol), Serum <10 (L) 10 - 30 ug/mL    Comment:        THERAPEUTIC CONCENTRATIONS VARY SIGNIFICANTLY. A RANGE OF 10-30 ug/mL MAY BE AN EFFECTIVE CONCENTRATION FOR MANY PATIENTS. HOWEVER, SOME ARE BEST TREATED AT CONCENTRATIONS OUTSIDE THIS RANGE. ACETAMINOPHEN CONCENTRATIONS >150 ug/mL AT 4 HOURS AFTER INGESTION AND >50 ug/mL AT 12 HOURS AFTER INGESTION ARE OFTEN ASSOCIATED WITH TOXIC REACTIONS.   cbc     Status: Abnormal   Collection Time: 07/16/16  8:04 PM  Result Value Ref Range   WBC 8.1 3.6 - 11.0 K/uL   RBC 4.46 3.80 - 5.20 MIL/uL   Hemoglobin 11.5 (L) 12.0 - 16.0 g/dL   HCT 35.6 35.0 - 47.0 %   MCV 79.7 (L) 80.0 - 100.0 fL   MCH 25.7 (L) 26.0 - 34.0 pg   MCHC 32.3 32.0 - 36.0 g/dL   RDW 19.9 (H) 11.5 - 14.5 %   Platelets 300 150 - 440 K/uL  Urine Drug Screen, Qualitative     Status: Abnormal   Collection Time: 07/16/16  8:04 PM  Result Value Ref Range   Tricyclic, Ur Screen NONE DETECTED NONE DETECTED   Amphetamines, Ur Screen NONE DETECTED NONE DETECTED   MDMA (Ecstasy)Ur Screen NONE DETECTED NONE DETECTED   Cocaine Metabolite,Ur [place] NONE DETECTED NONE DETECTED   Opiate, Ur Screen NONE DETECTED NONE DETECTED   Phencyclidine (PCP) Ur S NONE DETECTED NONE DETECTED   Cannabinoid 50 Ng, Ur Platea POSITIVE (A) NONE DETECTED   Barbiturates, Ur Screen NONE DETECTED NONE DETECTED   Benzodiazepine, Ur Scrn NONE DETECTED NONE DETECTED   Methadone Scn, Ur NONE DETECTED NONE DETECTED    Comment: (NOTE) 021  Tricyclics, urine               Cutoff 1000 ng/mL 200  Amphetamines, urine              Cutoff 1000 ng/mL 300  MDMA (Ecstasy), urine  Cutoff 500 ng/mL 400  Cocaine Metabolite, urine       Cutoff 300 ng/mL 500  Opiate, urine                   Cutoff 300 ng/mL 600  Phencyclidine (PCP), urine      Cutoff 25 ng/mL 700  Cannabinoid, urine              Cutoff 50 ng/mL 800  Barbiturates, urine             Cutoff 200 ng/mL 900  Benzodiazepine, urine           Cutoff 200 ng/mL 1000 Methadone, urine                Cutoff 300 ng/mL 1100 1200 The urine drug screen provides only a preliminary, unconfirmed 1300 analytical test result and should not be used for non-medical 1400 purposes. Clinical consideration and professional judgment should 1500 be applied to any positive drug screen result due to possible 1600 interfering substances. A more specific alternate chemical method 1700 must be used in order to obtain a confirmed analytical result.  1800 Gas chromato graphy / mass spectrometry (GC/MS) is the preferred 1900 confirmatory method.   Pregnancy, urine POC     Status: None   Collection Time: 07/16/16  8:23 PM  Result Value Ref Range   Preg Test, Ur NEGATIVE NEGATIVE    Comment:        THE SENSITIVITY OF THIS METHODOLOGY IS >24 mIU/mL     Current Facility-Administered Medications  Medication Dose Route Frequency Provider Last Rate Last Dose  . QUEtiapine (SEROQUEL) tablet 100 mg  100 mg Oral QHS Gonzella Lex, MD      . sertraline (ZOLOFT) tablet 100 mg  100 mg Oral Daily Gonzella Lex, MD   100 mg at 07/17/16 1321   Current Outpatient Prescriptions  Medication Sig Dispense Refill  . albuterol (PROVENTIL) (5 MG/ML) 0.5% nebulizer solution Take 2.5 mg by nebulization every 6 (six) hours as needed for wheezing or shortness of breath.    Marland Kitchen HYDROcodone-acetaminophen (NORCO) 5-325 MG tablet Take 1 tablet by mouth at bedtime as needed for moderate pain. (Patient not taking: Reported on 07/17/2016) 10 tablet 0  . ibuprofen (ADVIL,MOTRIN) 600 MG tablet Take 1 tablet  (600 mg total) by mouth every 6 (six) hours. (Patient not taking: Reported on 07/17/2016) 30 tablet 0  . naproxen (NAPROSYN) 500 MG tablet Take 1 tablet (500 mg total) by mouth 2 (two) times daily with a meal. (Patient not taking: Reported on 07/17/2016) 20 tablet 00  . QUEtiapine (SEROQUEL) 50 MG tablet Take 50 mg by mouth at bedtime.    . traMADol (ULTRAM) 50 MG tablet Take 1 tablet (50 mg total) by mouth every 6 (six) hours as needed. (Patient not taking: Reported on 07/17/2016) 12 tablet 0  . traMADol (ULTRAM) 50 MG tablet Take 1 tablet (50 mg total) by mouth every 6 (six) hours as needed. (Patient not taking: Reported on 07/17/2016) 12 tablet 0  . traMADol (ULTRAM) 50 MG tablet Take 1 tablet (50 mg total) by mouth every 6 (six) hours as needed for moderate pain. (Patient not taking: Reported on 07/17/2016) 12 tablet 0    Musculoskeletal: Strength & Muscle Tone: within normal limits Gait & Station: normal Patient leans: N/A  Psychiatric Specialty Exam: Physical Exam  Nursing note and vitals reviewed. Constitutional: She appears well-developed and well-nourished.  HENT:  Head: Normocephalic and atraumatic.  Eyes:  Conjunctivae are normal. Pupils are equal, round, and reactive to light.  Neck: Normal range of motion.  Cardiovascular: Regular rhythm and normal heart sounds.   Respiratory: Effort normal and breath sounds normal. No respiratory distress.  GI: Soft.  Musculoskeletal: Normal range of motion.  Neurological: She is alert.  Skin: Skin is warm and dry.  Psychiatric: Judgment normal. Her affect is blunt. Her speech is delayed. She is slowed and withdrawn. Cognition and memory are normal. She exhibits a depressed mood. She expresses suicidal ideation. She expresses suicidal plans.    Review of Systems  Constitutional: Negative.   HENT: Negative.   Eyes: Negative.   Respiratory: Negative.   Cardiovascular: Negative.   Gastrointestinal: Negative.   Musculoskeletal: Negative.   Skin:  Negative.   Neurological: Negative.   Psychiatric/Behavioral: Positive for depression, hallucinations, substance abuse and suicidal ideas. Negative for memory loss. The patient is nervous/anxious and has insomnia.     Blood pressure (!) 100/42, pulse 98, temperature 98.3 F (36.8 C), temperature source Oral, resp. rate 18, height '5\' 2"'  (1.575 m), weight 74.8 kg (165 lb), SpO2 100 %, unknown if currently breastfeeding.Body mass index is 30.18 kg/m.  General Appearance: Fairly Groomed  Eye Contact:  Fair  Speech:  Slow  Volume:  Decreased  Mood:  Depressed  Affect:  Depressed  Thought Process:  Goal Directed  Orientation:  Full (Time, Place, and Person)  Thought Content:  Logical and Hallucinations: Auditory  Suicidal Thoughts:  Yes.  with intent/plan  Homicidal Thoughts:  No  Memory:  Immediate;   Fair Recent;   Fair Remote;   Fair  Judgement:  Fair  Insight:  Fair  Psychomotor Activity:  Decreased  Concentration:  Concentration: Fair  Recall:  [employer] of Knowledge:  Fair  Language:  Fair  Akathisia:  No  Handed:  Right  AIMS (if indicated):     Assets:  Desire for Improvement Housing Physical Health Resilience Social Support  ADL's:  Intact  Cognition:  WNL  Sleep:        Treatment Plan Summary: Daily contact with patient to assess and evaluate symptoms and progress in treatment, Medication management and Plan 21 year old woman who reports not only being extremely depressed but having hallucinations and having intrusive suicidal thoughts. Does not feel safe at home. Now that she has found safe care for her children over the next few days she is very willing to comply with admission to the hospital. Patient will be admitted to the psychiatric ward. Full set of labs done. EKG done. Continue outpatient medicines with a slight increase in the Seroquel to 100 mg at night. Engage in group and individual therapy.  Disposition: Recommend psychiatric Inpatient admission when  medically cleared. Supportive therapy provided about ongoing stressors.  Alethia Berthold, MD 07/17/2016 4:10 PM

## 2016-07-17 NOTE — ED Notes (Signed)
[facility] Pt accepting. Pt currently speaking to her cousin on the phone. Pt is calm. Maintained on 15 minute checks and observation by security camera for safety.

## 2016-07-17 NOTE — ED Notes (Signed)
Pt. Alert and oriented, warm and dry, in no distress. Pt. Denies HI. Pt states SI and AVH ar better but are still there. Patient verbally contracts for safety with this Clinical research associatewriter.  Pt. Encouraged to let nursing staff know of any concerns or needs.

## 2016-07-17 NOTE — Progress Notes (Signed)
LCSW consulted with ED Psychiatrist [facility]   Rily Nickey LCSW [phone]

## 2016-07-18 ENCOUNTER — Inpatient Hospital Stay
Admit: 2016-07-18 | Discharge: 2016-07-21 | DRG: 885 | Disposition: A | Discharge status: Home / Self Care | Payer: Medicaid Other | Attending: Psychiatry | Admitting: Psychiatry

## 2016-07-18 DIAGNOSIS — F1220 Cannabis dependence, uncomplicated: Secondary | ICD-10-CM | POA: Diagnosis present

## 2016-07-18 DIAGNOSIS — F603 Borderline personality disorder: Secondary | ICD-10-CM | POA: Diagnosis present

## 2016-07-18 DIAGNOSIS — G8929 Other chronic pain: Secondary | ICD-10-CM | POA: Diagnosis present

## 2016-07-18 DIAGNOSIS — Z884 Allergy status to anesthetic agent status: Secondary | ICD-10-CM

## 2016-07-18 DIAGNOSIS — G4700 Insomnia, unspecified: Secondary | ICD-10-CM | POA: Diagnosis present

## 2016-07-18 DIAGNOSIS — F17210 Nicotine dependence, cigarettes, uncomplicated: Secondary | ICD-10-CM | POA: Diagnosis present

## 2016-07-18 DIAGNOSIS — Z881 Allergy status to other antibiotic agents status: Secondary | ICD-10-CM

## 2016-07-18 DIAGNOSIS — J45909 Unspecified asthma, uncomplicated: Secondary | ICD-10-CM | POA: Diagnosis present

## 2016-07-18 DIAGNOSIS — F411 Generalized anxiety disorder: Secondary | ICD-10-CM | POA: Diagnosis present

## 2016-07-18 DIAGNOSIS — R45851 Suicidal ideations: Secondary | ICD-10-CM | POA: Diagnosis present

## 2016-07-18 DIAGNOSIS — F333 Major depressive disorder, recurrent, severe with psychotic symptoms: Principal | ICD-10-CM | POA: Diagnosis present

## 2016-07-18 DIAGNOSIS — Z880 Allergy status to penicillin: Secondary | ICD-10-CM | POA: Diagnosis not present

## 2016-07-18 DIAGNOSIS — F4310 Post-traumatic stress disorder, unspecified: Secondary | ICD-10-CM | POA: Diagnosis present

## 2016-07-18 DIAGNOSIS — Z79899 Other long term (current) drug therapy: Secondary | ICD-10-CM

## 2016-07-18 DIAGNOSIS — M549 Dorsalgia, unspecified: Secondary | ICD-10-CM | POA: Diagnosis present

## 2016-07-18 DIAGNOSIS — Z818 Family history of other mental and behavioral disorders: Secondary | ICD-10-CM

## 2016-07-18 DIAGNOSIS — F17200 Nicotine dependence, unspecified, uncomplicated: Secondary | ICD-10-CM | POA: Diagnosis present

## 2016-07-18 DIAGNOSIS — Z91410 Personal history of adult physical and sexual abuse: Secondary | ICD-10-CM

## 2016-07-18 MED ORDER — LORATADINE 10 MG PO TABS
ORAL_TABLET | ORAL | Status: AC
  Administered 2016-07-18: 10 mg via ORAL
  Filled 2016-07-18: qty 1

## 2016-07-18 MED ORDER — ALUM & MAG HYDROXIDE-SIMETH 200-200-20 MG/5ML PO SUSP: 30.0000 mL | ORAL | Status: DC | PRN

## 2016-07-18 MED ORDER — TRAZODONE HCL 100 MG PO TABS
100.0000 mg | ORAL_TABLET | Freq: Every evening | ORAL | Status: DC | PRN
  Administered 2016-07-18 – 2016-07-19 (×2): 100 mg via ORAL
  Filled 2016-07-18 (×2): qty 1

## 2016-07-18 MED ORDER — ACETAMINOPHEN 325 MG PO TABS: 650.0000 mg | ORAL_TABLET | Freq: Four times a day (QID) | ORAL | Status: DC | PRN

## 2016-07-18 MED ORDER — MAGNESIUM HYDROXIDE 400 MG/5ML PO SUSP: 30.0000 mL | Freq: Every day | ORAL | Status: DC | PRN

## 2016-07-18 MED ORDER — SERTRALINE HCL 100 MG PO TABS
100.0000 mg | ORAL_TABLET | Freq: Every day | ORAL | Status: DC
  Administered 2016-07-19 – 2016-07-21 (×3): 100 mg via ORAL
  Filled 2016-07-18 (×3): qty 1

## 2016-07-18 MED ORDER — LORATADINE 10 MG PO TABS
10.0000 mg | ORAL_TABLET | Freq: Every day | ORAL | Status: DC
  Administered 2016-07-18: 10 mg via ORAL

## 2016-07-18 MED ORDER — HYDROXYZINE HCL 50 MG PO TABS
50.0000 mg | ORAL_TABLET | Freq: Three times a day (TID) | ORAL | Status: DC | PRN
  Administered 2016-07-18 – 2016-07-20 (×4): 50 mg via ORAL
  Filled 2016-07-18 (×4): qty 1

## 2016-07-18 MED ORDER — QUETIAPINE FUMARATE 100 MG PO TABS
100.0000 mg | ORAL_TABLET | Freq: Every day | ORAL | Status: DC
  Administered 2016-07-18: 100 mg via ORAL
  Filled 2016-07-18: qty 1

## 2016-07-18 NOTE — Plan of Care (Signed)
Problem: Activity: Goal: Interest or engagement in activities will improve Outcome: Progressing Verbalize understanding  Of information  Received

## 2016-07-18 NOTE — ED Provider Notes (Signed)
-----------------------------------------   9:56 AM on 07/18/2016 -----------------------------------------   Blood pressure 135/81, pulse 88, temperature 98.3 F (36.8 C), temperature source Oral, resp. rate 18, height 5\' 2"  (1.575 m), weight 165 lb (74.8 kg), SpO2 100 %, unknown if currently breastfeeding.  No acute events overnight. Patient's medical workup has been largely nonrevealing. Patient has been seen and evaluated by psychiatry they will admit to their service once a bed becomes available for further treatment.     Minna AntisPaduchowski, Alayziah Tangeman, MD 07/18/16 [phone]

## 2016-07-18 NOTE — Progress Notes (Signed)
D: Patient spent most of the evening in the dayroom watching television or walking in the hall.  She was cooperative and calm in most interactions.  She asked to "move in with another female patient whom she has established a friendship with".  This was denied and Eli PhillipsKori appeared accepting of the information. A: Patient is getting settled into the unit.  She denies SI/AV/H this evening. R: Continue to assess behavior and mood.  Provide support and education.

## 2016-07-18 NOTE — Plan of Care (Signed)
Problem: Safety: Goal: Ability to identify and utilize support systems that promote safety will improve Outcome: Progressing Patient denies SI/AV/H and  contracts for safety.

## 2016-07-18 NOTE — [facility] Group Notes (Signed)
[facility] Group Notes:  (Nursing/MHT/Case Management/Adjunct)  Date:  07/18/2016  Time:  11:25 PM  Type of Therapy:  Group Therapy  Participation Level:  Active  Participation Quality:  Appropriate  Affect:  Appropriate  Cognitive:  Alert  Insight:  Appropriate  Engagement in Group:  Engaged  Modes of Intervention:  Support  Summary of Progress/Problems:  Kristy Reid 07/18/2016, 11:25 PM

## 2016-07-18 NOTE — BH Assessment (Signed)
[facility] Amend[facility]acs.  Attending Physician will be Dr. Jennet MaduroPucilowska.   Patient has been [facility]A, [facility] Gwen.   Intake Paper Work has been signed and placed on patient chart.  ER staff is aware of the admission [place]( [facility] Frazier ButtLouAnn Patient's Nurse & [facility] Patient Access).  **[facility] Charge RN requested that pt be transferred to unit at 1:30pm. This information was relayed to pt's nurse.
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
## 2016-07-18 NOTE — Progress Notes (Signed)
Admission Note: Report given by Diona FantiLu Ann Crissman RN  D: Patient noted pleasant  On approach voice of  at this time she was having suicidal thoughts .Stated the only reason she had not completed the suicidal act was because of her having the baby. Patient was previously   On unit and pregnant .Patient has since had the baby  Whom is 2 months . Patient had visited the Emergency Room 16 times in last 6 months . Patient   Smokes pot  Pt is redirectable and cooperative with assessment.      A: Pt admitted to unit per protocol, skin assessment and search done and no contraband found.  Pt  educated on therapeutic milieu rules. Pt was introduced to milieu by nursing staff.    R: Pt was receptive to education about the milieu .  15 min safety checks started. Clinical research associatewriter offered support
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
## 2016-07-18 NOTE — [facility] Suicide Risk Assessment (Signed)
[facility] INPATIENT:  Family/Significant Other Suicide Prevention Education  Suicide Prevention Education:  Education Completed;Kristy Reid(cousin [phone]), has been identified by the patient as the family member/significant other with whom the patient will be residing, and identified as the person(s) who will aid the patient in the event of a mental health crisis (suicidal ideations/suicide attempt).  With written consent from the patient, the family member/significant other has been provided the following suicide prevention education, prior to the and/or following the discharge of the patient.  The suicide prevention education provided includes the following:  Suicide risk factors  Suicide prevention and interventions  National Suicide Hotline telephone number  [facility] assessment telephone number  [facility] Assistance 911  [facility] and/or Residential Mobile Crisis Unit telephone number  Request made of family/significant other to:  Remove weapons (e.g., guns, rifles, knives), all items previously/currently identified as safety concern.    Remove drugs/medications (over-the-counter, prescriptions, illicit drugs), all items previously/currently identified as a safety concern.  The family member/significant other verbalizes understanding of the suicide prevention education information provided.  The family member/significant other agrees to remove the items of safety concern listed above.  Kristy Reid G. Garnette CzechSampson MSW, LCSWA 07/18/2016, 3:45 PM

## 2016-07-18 NOTE — BH Assessment (Signed)
Pt meets criteria for placement per Dr. Toni Amendlapacs.   Pt under review for bed per Dedra SkeensGwen, Advice workerN (BMU Charge RN).
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
## 2016-07-19 ENCOUNTER — Encounter: Payer: Self-pay | Admitting: Psychiatry

## 2016-07-19 DIAGNOSIS — F333 Major depressive disorder, recurrent, severe with psychotic symptoms: Principal | ICD-10-CM

## 2016-07-19 LAB — LIPID PANEL
CHOL/HDL RATIO: 4.3 ratio
Cholesterol: 183 mg/dL (ref 0–200)
HDL: 43 mg/dL (ref >40)
LDL Cholesterol: 122 mg/dL — ABNORMAL HIGH (ref 0–99)
TRIGLYCERIDES: 88 mg/dL (ref <150)
VLDL: 18 mg/dL (ref 0–40)

## 2016-07-19 LAB — TSH: TSH: 0.46 u[IU]/mL (ref 0.350–4.500)

## 2016-07-19 MED ORDER — NICOTINE 21 MG/24HR TD PT24
21.0000 mg | MEDICATED_PATCH | Freq: Every day | TRANSDERMAL | Status: DC
  Administered 2016-07-19 – 2016-07-21 (×2): 21 mg via TRANSDERMAL
  Filled 2016-07-19 (×3): qty 1

## 2016-07-19 MED ORDER — QUETIAPINE FUMARATE 25 MG PO TABS
50.0000 mg | ORAL_TABLET | Freq: Four times a day (QID) | ORAL | Status: DC | PRN
  Administered 2016-07-19 – 2016-07-20 (×2): 50 mg via ORAL
  Filled 2016-07-19 (×2): qty 2

## 2016-07-19 MED ORDER — QUETIAPINE FUMARATE 200 MG PO TABS
200.0000 mg | ORAL_TABLET | Freq: Every day | ORAL | Status: DC
  Administered 2016-07-19: 200 mg via ORAL
  Filled 2016-07-19: qty 1

## 2016-07-19 MED ORDER — ALBUTEROL SULFATE HFA 108 (90 BASE) MCG/ACT IN AERS
1.0000 | INHALATION_SPRAY | Freq: Four times a day (QID) | RESPIRATORY_TRACT | Status: DC | PRN
  Administered 2016-07-19: 2 via RESPIRATORY_TRACT
  Filled 2016-07-19: qty 6.7

## 2016-07-19 MED ORDER — IBUPROFEN 200 MG PO TABS: 400.0000 mg | ORAL_TABLET | Freq: Four times a day (QID) | ORAL | Status: DC | PRN

## 2016-07-19 MED ORDER — WHITE PETROLATUM GEL
Status: DC | PRN
  Filled 2016-07-19: qty 5

## 2016-07-19 NOTE — Plan of Care (Signed)
Problem: Coping: Goal: Ability to verbalize feelings will improve Outcome: Progressing Pt does verbalize feelings of anxiety and having suicidal thoughts without a specific plan. Socializes in dayroom, interacts appropriately.

## 2016-07-19 NOTE — Progress Notes (Signed)
Pt reporting she got scared during the night because her curtain was open in her room, later on today, pt approaches nurse again stating, "I went in my room and the light was off in my bathroom, I got scared." Pt also complaining of anxiety, medications given as ordered PRN. Pt reports poor sleep last night with the help of sleep medications, poor depression, low energy, poor concentration. Rates depression 8/10, hopelessness 8/10, anxiety 10/10 (low 0-10 high). Endorses passive SI. When asked about suicidal thoughts, pt stated, "I have suicidal thoughts, but I don't have a plan." Denies HI/AVH, pain. Goal today is "my mindset" by "take my meds and interact." Pt appropriately interacts with staff/peers. Attends group. Socializes in dayroom. Pt is medication compliant. Support and encouragement provided. Medications administered as ordered with education. Will continue to monitor.

## 2016-07-19 NOTE — Progress Notes (Signed)
NUTRITION ASSESSMENT  Pt identified as at risk on the Malnutrition Screen Tool  INTERVENTION: 1. Educated patient on the importance of nutrition and encouraged intake of food and beverages. 2. Discussed weight goals. 3. Supplements: None  Goal: Pt to meet >/= 90% of their estimated nutrition needs.  Monitor:  PO intake  Assessment:  Kristy Reid is a 21 yo female who just had a baby 2 months ago and since has exhibited expected weight loss. 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 Has hallucinations and hears voices. Seems ok from a nutrition perspective. No acute complaints.  Height: Ht Readings from Last 1 Encounters:  07/18/16 5\' 2"  (1.575 m)    Weight: Wt Readings from Last 1 Encounters:  07/18/16 166 lb (75.3 kg)    Weight Hx: Wt Readings from Last 10 Encounters:  07/18/16 166 lb (75.3 kg)  07/16/16 165 lb (74.8 kg)  06/26/16 185 lb (83.9 kg)  06/15/16 185 lb (83.9 kg)  05/20/16 185 lb (83.9 kg)  05/07/16 205 lb (93 kg)  04/14/16 195 lb (88.5 kg)  04/10/16 190 lb (86.2 kg)  04/06/16 190 lb (86.2 kg)  02/25/16 180 lb (81.6 kg)    BMI:  Body mass index is 30.36 kg/m. Pt meets criteria for obese class I based on current BMI.  Estimated Nutritional Needs: Kcal: 25-30 kcal/kg Protein: > 1 gram protein/kg Fluid: 1 ml/kcal  Diet Order: Diet regular Room service appropriate? Yes; Fluid consistency: Thin Pt is also offered choice of unit snacks mid-morning and mid-afternoon.  Pt is eating as desired.   Lab results and medications reviewed.   Dionne AnoWilliam M. Murice Barbar, MS, RD LDN Inpatient Clinical Dietitian Pager [phone]

## 2016-07-19 NOTE — [facility] Group Notes (Signed)
[facility] LCSW Group Therapy  07/19/2016 2:34 PM  Type of Therapy:  Group Therapy  Participation Level:  Active  Participation Quality:  Appropriate and Sharing  Affect:  Appropriate  Cognitive:  Alert  Insight:  Developing/Improving  Engagement in Therapy:  Engaged  Modes of Intervention:  Activity, Clarification, Discussion, Education, Exploration, Limit-setting, Problem-solving, Reality Testing, Socialization and Support  Summary of Progress/Problems: Coping Skills: Patients defined and discussed healthy coping skills. Patients identified healthy coping skills they would like to try during hospitalization and after discharge. CSW offered insight to varying coping skills that may have been new to patients such as practicing mindfulness.  Kristy Reid G. Garnette CzechSampson MSW, LCSWA 07/19/2016, 2:34 PM

## 2016-07-19 NOTE — [facility] Group Notes (Signed)
[facility] Group Notes:  (Nursing/MHT/Case Management/Adjunct)  Date:  07/19/2016  Time:  11:21 PM  Type of Therapy:  Group Therapy  Participation Level:  Active  Participation Quality:  Appropriate  Affect:  Appropriate  Cognitive:  Appropriate  Insight:  Appropriate  Engagement in Group:  Engaged  Modes of Intervention:  Discussion  Summary of Progress/Problems:  Kristy EkJanice Marie Raziah Reid 07/19/2016, 11:21 PM
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
## 2016-07-20 MED ORDER — QUETIAPINE FUMARATE 200 MG PO TABS
300.0000 mg | ORAL_TABLET | Freq: Every day | ORAL | Status: DC
  Administered 2016-07-20: 300 mg via ORAL
  Filled 2016-07-20: qty 1

## 2016-07-20 MED ORDER — LORATADINE 10 MG PO TABS
10.0000 mg | ORAL_TABLET | Freq: Every day | ORAL | Status: DC
  Administered 2016-07-20 – 2016-07-21 (×2): 10 mg via ORAL
  Filled 2016-07-20 (×2): qty 1

## 2016-07-20 MED ORDER — TRAZODONE HCL 50 MG PO TABS
150.0000 mg | ORAL_TABLET | Freq: Every evening | ORAL | Status: DC | PRN
  Administered 2016-07-20: 150 mg via ORAL
  Filled 2016-07-20: qty 1

## 2016-07-20 NOTE — Plan of Care (Signed)
Problem: Coping: Goal: Ability to verbalize feelings will improve Outcome: Not Progressing Patient is observed socializing with peers and talking on the phone with appropriate calm behavior.

## 2016-07-20 NOTE — Progress Notes (Deleted)
[facility] MD Progress Note  07/20/2016 4:54 PM Kristy Reid  MRN:  [id number]  Subjective:   07/20/2016. Kristy Reid met with treatment team today. She complains of symptoms of depression and still feels suicidal with a plan to overdose. She denies any psychotic symptoms. Sleep was poor. She seems to tolerate medications well. There are no somatic complaints except for allergies. She participates in programming.  Per nursing: Patient is observed with appropriate behavior as she socializes with peers and uses the phone.  She reports hearing voices telling her "to harm myself".  She states she has no plan and the voices do not suggest a plan. Patient contracts for safety.  Principal Problem: Severe recurrent major depression with psychotic features [facility]) Diagnosis:   Patient Active Problem List   Diagnosis Date Noted  . Severe recurrent major depression with psychotic features ([place]) [F33.3] 07/18/2016  . Depression, major, recurrent, severe with psychosis ([place]) [F33.3] 07/17/2016  . Normal labor [O80, Z37.9] 05/07/2016  . Labor and delivery, indication for care [O75.9] 04/19/2016  . Indication for care in labor and delivery, antepartum [O75.9] 04/06/2016  . Decreased fetal movement [O36.8190] 03/22/2016  . Abdominal pain in pregnancy [O26.899, R10.9] 01/21/2016  . Constipation during pregnancy in second trimester [O99.612, K59.00] 12/26/2015  . Overdose [T50.901A] 11/29/2015  . Pregnancy (I trimester) [Z34.90] 11/15/2015  . Cannabis use disorder, severe, dependence ([place]) [F12.20] 11/15/2015  . Tobacco use disorder [F17.200] 11/15/2015  . Asthma [J45.909] 11/15/2015  . Borderline personality disorder [F60.3] 11/15/2015  . PTSD (post-traumatic stress disorder) [F43.10] 11/15/2015  . Severe recurrent major depression without psychotic features ([place]) [F33.2] 11/13/2015   Total Time spent with patient: 30 minutes  Past Psychiatric History: depression.  Past Medical History:  Past Medical History:   Diagnosis Date  . Anemia   . Anemia   . Anxiety   . Asthma   . Chronic back pain   . Depression   . Insomnia   . Personality disorder    "boarderline"  . PTSD (post-traumatic stress disorder)     Past Surgical History:  Procedure Laterality Date  . BUNIONECTOMY Bilateral    feet  . wrist sugery     Family History:  Family History  Problem Relation Age of Onset  . Diabetes Mother   . Diabetes Maternal Grandmother   . Heart disease Maternal Grandmother   . Cancer Neg Hx   . Ovarian cancer Neg Hx    Family Psychiatric  History: see H&P Social History:  History  Alcohol Use No     History  Drug Use  . Frequency: 1.0 time per week  . Types: Marijuana    Comment: last 2 days ago    Social History   Social History  . Marital status: Single    Spouse name: N/A  . Number of children: N/A  . Years of education: N/A   Social History Main Topics  . Smoking status: Current Every Day Smoker    Types: Cigarettes    Last attempt to quit: 08/13/2015  . Smokeless tobacco: Never Used  . Alcohol use No  . Drug use: Yes    Frequency: 1.0 time per week    Types: Marijuana     Comment: last 2 days ago  . Sexual activity: Yes    Birth control/ protection: Injection   Other Topics Concern  . None   Social History Narrative  . None   Additional Social History:  Sleep: Fair  Appetite:  Fair  Current Medications: Current Facility-Administered Medications  Medication Dose Route Frequency Provider Last Rate Last Dose  . acetaminophen (TYLENOL) tablet 650 mg  650 mg Oral Q6H PRN [name], [name], MD      . albuterol (PROVENTIL HFA;VENTOLIN HFA) 108 (90 Base) MCG/ACT inhaler 1-2 puff  1-2 puff Inhalation Q6H PRN [name], Madie Reno, MD   2 puff at 07/19/16 1836  . alum & mag hydroxide-simeth (MAALOX/MYLANTA) 200-200-20 MG/5ML suspension 30 mL  30 mL Oral Q4H PRN [name], [name], MD      . hydrOXYzine (ATARAX/VISTARIL) tablet 50 mg  50 mg Oral  TID PRN [name], Madie Reno, MD   50 mg at 07/20/16 6734  . ibuprofen (ADVIL,MOTRIN) tablet 400 mg  400 mg Oral Q6H PRN [name], [name], MD      . loratadine (CLARITIN) tablet 10 mg  10 mg Oral Daily Zarai Orsborn B, MD   10 mg at 07/20/16 1349  . magnesium hydroxide (MILK OF MAGNESIA) suspension 30 mL  30 mL Oral Daily PRN [name], [name], MD      . nicotine (NICODERM CQ - dosed in mg/24 hours) patch 21 mg  21 mg Transdermal Daily [name], [name], MD   21 mg at 07/19/16 1004  . QUEtiapine (SEROQUEL) tablet 300 mg  300 mg Oral QHS Jaelynne Hockley B, MD      . QUEtiapine (SEROQUEL) tablet 50 mg  50 mg Oral Q6H PRN [name], Madie Reno, MD   50 mg at 07/20/16 1219  . sertraline (ZOLOFT) tablet 100 mg  100 mg Oral Daily [name], Madie Reno, MD   100 mg at 07/20/16 1937  . traZODone (DESYREL) tablet 150 mg  150 mg Oral QHS PRN Ellorie Kindall B, MD      . white petrolatum (VASELINE) gel   Topical PRN Simran Bomkamp B, MD        Lab Results:  Results for orders placed or performed during the hospital encounter of 07/18/16 (from the past 48 hour(s))  Lipid panel     Status: Abnormal   Collection Time: 07/19/16  7:10 AM  Result Value Ref Range   Cholesterol 183 0 - 200 mg/dL   Triglycerides 88 <150 mg/dL   HDL 43 >40 mg/dL   Total CHOL/HDL Ratio 4.3 RATIO   VLDL 18 0 - 40 mg/dL   LDL Cholesterol 122 (H) 0 - 99 mg/dL    Comment:        Total Cholesterol/HDL:CHD Risk Coronary Heart Disease Risk Table                     Men   Women  1/2 Average Risk   3.4   3.3  Average Risk       5.0   4.4  2 X Average Risk   9.6   7.1  3 X Average Risk  23.4   11.0        Use the calculated Patient Ratio above and the CHD Risk Table to determine the patient's CHD Risk.        ATP III CLASSIFICATION (LDL):  <100     mg/dL   Optimal  100-129  mg/dL   Near or Above                    Optimal  130-159  mg/dL   Borderline  160-189  mg/dL   High  >190     mg/dL  Very High   TSH     Status: None    Collection Time: 07/19/16  7:10 AM  Result Value Ref Range   TSH 0.460 0.350 - 4.500 uIU/mL    Comment: Performed by a 3rd Generation assay with a functional sensitivity of <=0.01 uIU/mL.    Blood Alcohol level:  Lab Results  Component Value Date   ETH <5 07/16/2016   ETH <5 40/12/2723    Metabolic Disorder Labs: Lab Results  Component Value Date   HGBA1C 5.1 11/15/2015   Lab Results  Component Value Date   PROLACTIN 67.6 (H) 11/15/2015   Lab Results  Component Value Date   CHOL 183 07/19/2016   TRIG 88 07/19/2016   HDL 43 07/19/2016   CHOLHDL 4.3 07/19/2016   VLDL 18 07/19/2016   LDLCALC 122 (H) 07/19/2016   LDLCALC 113 (H) 11/15/2015    Physical Findings: AIMS:  , ,  ,  ,    CIWA:    COWS:     Musculoskeletal: Strength & Muscle Tone: within normal limits Gait & Station: normal Patient leans: N/A  Psychiatric Specialty Exam: Physical Exam  Nursing note and vitals reviewed. Psychiatric: Her speech is normal. Her mood appears anxious. Cognition and memory are normal. She expresses impulsivity. She exhibits a depressed mood. She expresses suicidal ideation. She expresses suicidal plans.    Review of Systems  Psychiatric/Behavioral: Positive for depression, hallucinations and suicidal ideas. The patient is nervous/anxious.   All other systems reviewed and are negative.   Blood pressure 117/69, pulse (!) 115, temperature 98.8 F (37.1 C), temperature source Oral, resp. rate 16, height '5\' 2"'  (1.575 m), weight 75.3 kg (166 lb), SpO2 100 %, unknown if currently breastfeeding.Body mass index is 30.36 kg/m.  General Appearance: Casual  Eye Contact:  Good  Speech:  Clear and Coherent  Volume:  Normal  Mood:  Anxious and Depressed  Affect:  Appropriate  Thought Process:  Goal Directed and Descriptions of Associations: Intact  Orientation:  Full (Time, Place, and Person)  Thought Content:  WDL  Suicidal Thoughts:  Yes.  with intent/plan  Homicidal Thoughts:  No   Memory:  Immediate;   Fair Recent;   Fair Remote;   Fair  Judgement:  Impaired  Insight:  Lacking  Psychomotor Activity:  Normal  Concentration:  Concentration: Fair and Attention Span: Fair  Recall:  [employer] of Knowledge:  Fair  Language:  Fair  Akathisia:  No  Handed:  Right  AIMS (if indicated):     Assets:  Communication Skills Desire for Improvement Financial Resources/Insurance Housing Physical Health Resilience Social Support  ADL's:  Intact  Cognition:  WNL  Sleep:  Number of Hours: 7.3     Treatment Plan Summary: Daily contact with patient to assess and evaluate symptoms and progress in treatment and Medication management   Ms. Costanza is a 21 year old female with a history of depression admitted for worsening of her symptoms since she has delivered a baby 2 months ago.  1. Suicidal ideation. The patient is able to contract for safety in the hospital.  2. Mood and psychosis. She was restarted on a combination of Zoloft and Seroquel. We increased Seroquel to 300 mg nightly.  3. Asthma. Albuterol is available. We added Claritin.  4. Anxiety. Hydroxyzine is available.  5. Insomnia. Trazodone was increased to 150 mg.   6. Smoking. Nicotine patch is available.  7. Metabolic syndrome monitoring. Lipid panel, TSH are normal, HgbA1C pending.  8. EKG. Normal  sinus rhythm, QTc 408.  9. Pregnancy test is negative.  10. Substance abuse. The patient is a cannabis user. She minimizes her problems and declines treatment.  11. Disposition. She will be discharged to home. She will follow up with [facility].  Orson Slick, MD 07/20/2016, 4:54 PM

## 2016-07-20 NOTE — Tx Team (Addendum)
Interdisciplinary Treatment and Diagnostic Plan Update  07/20/2016 Time of Session: 10:30 AM Kristy Reid MRN: [id number]  Principal Diagnosis: Severe recurrent major depression with psychotic features (HCC)  Secondary Diagnoses: Principal Problem:   Severe recurrent major depression with psychotic features (HCC) Active Problems:   Cannabis use disorder, severe, dependence (HCC)   Tobacco use disorder   Asthma   PTSD (post-traumatic stress disorder)   Current Medications:  Current Facility-Administered Medications  Medication Dose Route Frequency Provider Last Rate Last Dose  . acetaminophen (TYLENOL) tablet 650 mg  650 mg Oral Q6H PRN [name], [name], MD      . albuterol (PROVENTIL HFA;VENTOLIN HFA) 108 (90 Base) MCG/ACT inhaler 1-2 puff  1-2 puff Inhalation Q6H PRN [name], [name], MD   2 puff at 07/19/16 1836  . alum & mag hydroxide-simeth (MAALOX/MYLANTA) 200-200-20 MG/5ML suspension 30 mL  30 mL Oral Q4H PRN [name], [name], MD      . hydrOXYzine (ATARAX/VISTARIL) tablet 50 mg  50 mg Oral TID PRN [name], [name], MD   50 mg at 07/20/16 6578  . ibuprofen (ADVIL,MOTRIN) tablet 400 mg  400 mg Oral Q6H PRN [name], [name], MD      . magnesium hydroxide (MILK OF MAGNESIA) suspension 30 mL  30 mL Oral Daily PRN [name], [name], MD      . nicotine (NICODERM CQ - dosed in mg/24 hours) patch 21 mg  21 mg Transdermal Daily [name], [name], MD   21 mg at 07/19/16 1004  . QUEtiapine (SEROQUEL) tablet 200 mg  200 mg Oral QHS [name], [name], MD   200 mg at 07/19/16 2106  . QUEtiapine (SEROQUEL) tablet 50 mg  50 mg Oral Q6H PRN [name], [name], MD   50 mg at 07/20/16 1219  . sertraline (ZOLOFT) tablet 100 mg  100 mg Oral Daily [name], [name], MD   100 mg at 07/20/16 4696  . traZODone (DESYREL) tablet 100 mg  100 mg Oral QHS PRN [name], [name], MD   100 mg at 07/19/16 2109  . white petrolatum (VASELINE) gel   Topical PRN Pucilowska, [name], MD       PTA Medications: Prescriptions Prior to  Admission  Medication Sig Dispense Refill Last Dose  . albuterol (PROVENTIL) (5 MG/ML) 0.5% nebulizer solution Take 2.5 mg by nebulization every 6 (six) hours as needed for wheezing or shortness of breath.   PRN at PRN  . HYDROcodone-acetaminophen (NORCO) 5-325 MG tablet Take 1 tablet by mouth at bedtime as needed for moderate pain. (Patient not taking: Reported on 07/17/2016) 10 tablet 0 Completed Course at Unknown time  . ibuprofen (ADVIL,MOTRIN) 600 MG tablet Take 1 tablet (600 mg total) by mouth every 6 (six) hours. (Patient not taking: Reported on 07/17/2016) 30 tablet 0 Completed Course at Unknown time  . naproxen (NAPROSYN) 500 MG tablet Take 1 tablet (500 mg total) by mouth 2 (two) times daily with a meal. (Patient not taking: Reported on 07/17/2016) 20 tablet 00 Completed Course at Unknown time  . QUEtiapine (SEROQUEL) 50 MG tablet Take 50 mg by mouth at bedtime.   Not Taking at Unknown time  . traMADol (ULTRAM) 50 MG tablet Take 1 tablet (50 mg total) by mouth every 6 (six) hours as needed. (Patient not taking: Reported on 07/17/2016) 12 tablet 0 Completed Course at Unknown time  . traMADol (ULTRAM) 50 MG tablet Take 1 tablet (50 mg total) by mouth every 6 (six) hours as needed. (Patient  not taking: Reported on 07/17/2016) 12 tablet 0 Completed Course at Unknown time  . traMADol (ULTRAM) 50 MG tablet Take 1 tablet (50 mg total) by mouth every 6 (six) hours as needed for moderate pain. (Patient not taking: Reported on 07/17/2016) 12 tablet 0 Completed Course at Unknown time    Patient Stressors: Substance abuse Traumatic event  Patient Strengths: Ability for insight Active sense of humor Average or above average intelligence Capable of independent living Licensed conveyancerCommunication skills Financial means Supportive family/friends  Treatment Modalities: Medication Management, Group therapy, Case management,  1 to 1 session with clinician, Psychoeducation, Recreational therapy.   Physician Treatment Plan for  Primary Diagnosis: Severe recurrent major depression with psychotic features (HCC) Long Term Goal(s): Improvement in symptoms so as ready for discharge Improvement in symptoms so as ready for discharge   Short Term Goals: Ability to verbalize feelings will improve Ability to disclose and discuss suicidal ideas Ability to identify and develop effective coping behaviors will improve Ability to maintain clinical measurements within normal limits will improve  Medication Management: Evaluate patient's response, side effects, and tolerance of medication regimen.  Therapeutic Interventions: 1 to 1 sessions, Unit Group sessions and Medication administration.  Evaluation of Outcomes: Progressing  Physician Treatment Plan for Secondary Diagnosis: Principal Problem:   Severe recurrent major depression with psychotic features (HCC) Active Problems:   Cannabis use disorder, severe, dependence (HCC)   Tobacco use disorder   Asthma   PTSD (post-traumatic stress disorder)  Long Term Goal(s): Improvement in symptoms so as ready for discharge Improvement in symptoms so as ready for discharge   Short Term Goals: Ability to verbalize feelings will improve Ability to disclose and discuss suicidal ideas Ability to identify and develop effective coping behaviors will improve Ability to maintain clinical measurements within normal limits will improve     Medication Management: Evaluate patient's response, side effects, and tolerance of medication regimen.  Therapeutic Interventions: 1 to 1 sessions, Unit Group sessions and Medication administration.  Evaluation of Outcomes: Progressing   RN Treatment Plan for Primary Diagnosis: Severe recurrent major depression with psychotic features (HCC) Long Term Goal(s): Knowledge of disease and therapeutic regimen to maintain health will improve  Short Term Goals: Ability to disclose and discuss suicidal ideas and Compliance with prescribed medications will  improve  Medication Management: RN will administer medications as ordered by provider, will assess and evaluate patient's response and provide education to patient for prescribed medication. RN will report any adverse and/or side effects to prescribing provider.  Therapeutic Interventions: 1 on 1 counseling sessions, Psychoeducation, Medication administration, Evaluate responses to treatment, Monitor vital signs and CBGs as ordered, Perform/monitor CIWA, COWS, AIMS and Fall Risk screenings as ordered, Perform wound care treatments as ordered.  Evaluation of Outcomes: Progressing   LCSW Treatment Plan for Primary Diagnosis: Severe recurrent major depression with psychotic features (HCC) Long Term Goal(s): Safe transition to appropriate next level of care at discharge, Engage patient in therapeutic group addressing interpersonal concerns.  Short Term Goals: Engage patient in aftercare planning with referrals and resources, Increase social support and Increase ability to appropriately verbalize feelings  Therapeutic Interventions: Assess for all discharge needs, 1 to 1 time with Social worker, Explore available resources and support systems, Assess for adequacy in community support network, Educate family and significant other(s) on suicide prevention, Complete Psychosocial Assessment, Interpersonal group therapy.  Evaluation of Outcomes: Progressing    Recreational Therapy Treatment Plan for Primary Diagnosis: Severe recurrent major depression with psychotic features (HCC) Long Term Goal(s):  Patient will participate in recreation therapy treatment in at least 2 group sessions without prompting from LRT  Short Term Goals: Increase self-esteem, Increase stress management skills  Treatment Modalities: Group Therapy and Individual Treatment Sessions  Therapeutic Interventions: Psychoeducation  Evaluation of Outcomes: Progressing   Progress in Treatment: Attending groups: Yes. Participating  in groups: Yes. Taking medication as prescribed: Yes. Toleration medication: Yes. Family/Significant other contact made: Yes, individual(s) contacted:  CSW contacted patient's cousin. Patient understands diagnosis: Yes. Discussing patient identified problems/goals with staff: Yes. Medical problems stabilized or resolved: Yes. Denies suicidal/homicidal ideation: Yes. Issues/concerns per patient self-inventory: No.  New problem(s) identified: No, Describe:  None identified.  New Short Term/Long Term Goal(s): Patient stated that her goal is to get back on the right medications.  Discharge Plan or Barriers: Patient will return home with cousin and follow-up with [facility].  Reason for Continuation of Hospitalization: Depression Medication stabilization Suicidal ideation  Estimated Length of Stay: 2-3 days   Attendees: Patient: Kristy Reid  07/20/2016 1:36 PM  Physician: Dr. Kristine Linea, MD  07/20/2016 1:36 PM  Nursing: Shelia Media, RN  07/20/2016 1:36 PM  RN Care Manager: 07/20/2016 1:36 PM  Social Worker: Hampton Abbot, MSW, LCSW-A 07/20/2016 1:36 PM  Recreational Therapist: Princella Ion, LRT/CTRS  07/20/2016 1:36 PM  Other: Daleen Squibb, LCSW 07/20/2016 1:36 PM  Other:  07/20/2016 1:36 PM  Other: 07/20/2016 1:36 PM    Scribe for Treatment Team: Lynden Oxford, LCSWA 07/20/2016 1:36 PM

## 2016-07-20 NOTE — [facility] Group Notes (Signed)
[facility] LCSW Group Therapy Note  Date/Time: 07/20/16, 0930  Type of Therapy and Topic:  Group Therapy:  Overcoming Obstacles  Participation Level:  active  Description of Group:    In this group patients will be encouraged to explore what they see as obstacles to their own wellness and recovery. They will be guided to discuss their thoughts, feelings, and behaviors related to these obstacles. The group will process together ways to cope with barriers, with attention given to specific choices patients can make. Each patient will be challenged to identify changes they are motivated to make in order to overcome their obstacles. This group will be process-oriented, with patients participating in exploration of their own experiences as well as giving and receiving support and challenge from other group members.  Therapeutic Goals: 1. Patient will identify personal and current obstacles as they relate to admission. 2. Patient will identify barriers that currently interfere with their wellness or overcoming obstacles.  3. Patient will identify feelings, thought process and behaviors related to these barriers. 4. Patient will identify two changes they are willing to make to overcome these obstacles:    Summary of Patient Progress: Pt identified mental health problems depression, PTSD, and ADHD which are obstacles in her life, both for her current admission and also for her longer term goal of furthering her education.  Pt was very active in group and gave feedback to other group members.  Almost too much advice giving, but good participation.      Therapeutic Modalities:   Cognitive Behavioral Therapy Solution Focused Therapy Motivational Interviewing Relapse Prevention Therapy  Daleen SquibbGreg Finlay Godbee, LCSW

## 2016-07-20 NOTE — Plan of Care (Signed)
Problem: Activity: Goal: Interest or engagement in activities will improve Outcome: Progressing Patient group compliant. Playing cards, interacting with staff and peers.

## 2016-07-20 NOTE — Progress Notes (Signed)
Patient is observed with appropriate behavior as she socializes with peers and uses the phone.  She reports hearing voices telling her "to harm myself".  She states she has no plan and the voices do not suggest a plan. Patient contracts for safety.

## 2016-07-20 NOTE — Progress Notes (Signed)
Patient pleasant and cooperative towards care. Denies HI, AVH, but endorses passive SI. Pt reports does not have a plan at present and verbally contracts for safety. Medication and group compliant. Appropriate with staff and peers.  Encouragement and support offered. Safety checks maintained. Medications given as prescribed. Pt receptive and remains safe on unit with q 15 min checks.

## 2016-07-20 NOTE — Progress Notes (Signed)
Recreation Therapy Notes  INPATIENT RECREATION THERAPY ASSESSMENT  Patient Details Name: Kristy Reid MRN: [id number] DOB: 02/18/1996 Today's Date: 07/20/2016  Patient Stressors: Family, Relationship, Other (Comment) (Patient reported she does not talk to her family; fiance was locked up but pt is thinking he will be released soon; caring for 452 month old son on her own - the baby was sick in the hospital with seizures but he is better now)  Coping Skills:   Isolate, Arguments, Substance Abuse, Avoidance, Exercise, Art/Dance, Music, Sports, Other (Comment) (Blow bubbles, showers)  Personal Challenges: Anger, Communication, Concentration, Decision-Making, Expressing Yourself, Problem-Solving, Relationships, Self-Esteem/Confidence, Social Interaction, Stress Management, Time Management, Trusting Others  Leisure Interests (2+):  Exercise - Walking, Individual - Other (Comment) (Play with children)  Awareness of Community Resources:  Yes  Community Resources:  [employer], [place]  Current Use: Yes  If no, Barriers?:    Patient Strengths:  "That is a good one.Marland Kitchen.Marland Kitchen.I couldn't right now"  Patient Identified Areas of Improvement:  Self-esteem  Current Recreation Participation:  Zoo  Patient Goal for Hospitalization:  To manage medications  [place]  County of Residence:  [place]   Current [place] (including self-harm):  Yes  Current HI:  No  Consent to Intern Participation: N/A   Jacquelynn CreeGreene,Elwyn Klosinski M, LRT/CTRS 07/20/2016, 3:16 PM

## 2016-07-20 NOTE — Progress Notes (Signed)
Recreation Therapy Notes  Date: 05.07.18 Time: 1:00 pm Location: Craft Room  Group Topic: Wellness  Goal Area(s) Addresses:  Patient will identify at least one item per dimension of health. Patient will examine areas they are deficient in.  Behavioral Response: Attentive, Interactive  Intervention: 6 Dimensions of Wellness  Activity: Patients were given a definition sheet defining each dimension of health and a worksheet with each dimension listed. Patients were instructed to write what they were currently doing in each dimension of health.  Education: LRT educated patients on ways they can improve each dimension of health.  Education Outcome: Acknowledges education/In group clarification offered  Clinical Observations/Feedback: Patient wrote items in all but one category. Patient contributed to group discussion by stating areas she was giving enough attention to, areas she was not giving enough attention to, how this activity relates to her admission, how this activity relates to her d/c, and what would change for her if she was more aware of her wellness.  Kristy Reid,Kristy Reid, LRT/CTRS 07/20/2016 1:39 PM

## 2016-07-20 NOTE — [facility] Group Notes (Signed)
[facility] Group Notes:  (Nursing/MHT/Case Management/Adjunct)  Date:  07/20/2016  Time:  4:09 PM  Type of Therapy:  Psychoeducational Skills  Participation Level:  Active  Participation Quality:  Appropriate  Affect:  Appropriate  Cognitive:  Alert  Insight:  Appropriate  Engagement in Group:  Engaged  Modes of Intervention:  Discussion and Education  Summary of Progress/Problems:  Mickey Farberamela M Moses Ellison 07/20/2016, 4:09 PM

## 2016-07-21 MED ORDER — LORATADINE 10 MG PO TABS: 10.0000 mg | ORAL_TABLET | Freq: Every day | ORAL | 1 refills | Status: DC

## 2016-07-21 MED ORDER — ALBUTEROL SULFATE HFA 108 (90 BASE) MCG/ACT IN AERS: 1.0000 | INHALATION_SPRAY | Freq: Four times a day (QID) | RESPIRATORY_TRACT | 1 refills | Status: DC | PRN

## 2016-07-21 MED ORDER — SERTRALINE HCL 100 MG PO TABS: 100.0000 mg | ORAL_TABLET | Freq: Every day | ORAL | 1 refills | Status: DC

## 2016-07-21 MED ORDER — QUETIAPINE FUMARATE 300 MG PO TABS: 300.0000 mg | ORAL_TABLET | Freq: Every day | ORAL | 1 refills | Status: DC

## 2016-07-21 MED ORDER — HYDROXYZINE HCL 50 MG PO TABS: 50.0000 mg | ORAL_TABLET | Freq: Three times a day (TID) | ORAL | 1 refills | Status: DC | PRN

## 2016-07-21 MED ORDER — TRAZODONE HCL 150 MG PO TABS: 150.0000 mg | ORAL_TABLET | Freq: Every evening | ORAL | 1 refills | Status: DC | PRN
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
## 2016-07-21 NOTE — Progress Notes (Signed)
Recreation Therapy Notes  INPATIENT RECREATION TR PLAN  Patient Details Name: Kristy Reid MRN: [id number] DOB: [birth year] Today's Date: 07/21/2016  Rec Therapy Plan Is patient appropriate for Therapeutic Recreation?: Yes Treatment times per week: At least once a week TR Treatment/Interventions: 1:1 session, Group participation (Comment) (Appropriate participation in daily recreational therapy tx)  Discharge Criteria Pt will be discharged from therapy if:: Treatment goals are met, Discharged Treatment plan/goals/alternatives discussed and agreed upon by:: Patient/family  Discharge Summary Short term goals set: See Care Plan Short term goals met: Complete Progress toward goals comments: One-to-one attended Which groups?: Wellness One-to-one attended: Self-esteem, Stress management Reason goals not met: N/A Therapeutic equipment acquired: None Reason patient discharged from therapy: Discharge from hospital Pt/family agrees with progress & goals achieved: Yes Date patient discharged from therapy: 07/21/16   Leonette Monarch, LRT/CTRS 07/21/2016, 1:51 PM

## 2016-07-21 NOTE — Plan of Care (Signed)
Problem: [facility] Participation in Recreation Therapeutic Interventions Goal: STG-Patient will demonstrate improved self esteem by identif STG: Self-Esteem - Within 3 treatment sessions, patient will verbalize at least 5 positive affirmation statements in one treatment session to increase self-esteem.  Outcome: Completed/Met Date Met: 07/21/16 Treatment Session 1; Completed 1 out of 1: At approximately 10:25 am, LRT met with patient in consultation room. Patient verbalized 5 positive affirmation statements. Patient reported it felt "good". LRT encouraged patient to continue saying positive affirmation statements.  Leonette Monarch, LRT/CTRS 05.08.18 11:42 am Goal: STG-Other Recreation Therapy Goal (Specify) STG: Stress Management - Within 3 treatment sessions, patient will verbalize understanding of the stress management techniques in one treatment session to increase stress management skills.  Outcome: Completed/Met Date Met: 07/21/16 Treatment Session 1; Completed 1 out of 1: At approximately 10:25 am, LRT met with patient in consultation room. LRT educated and provided patient with handouts on stress management techniques. Patient verbalized understanding. LRT encouraged patient to read over and practice the stress management techniques.  Leonette Monarch, LRT/CTRS 05.08.18 11:43 am

## 2016-07-21 NOTE — Progress Notes (Signed)
D: Pt denies SI/HI/AVH. Pt is pleasant and cooperative, affect is blunted rated depression about 5/10 no distress noted. Pt stated she feels better from taking her medication, she appears less anxious and she is interacting with peers and staff appropriately.  A: Pt was offered support and encouragement. Pt was given scheduled medications. Pt was encouraged to attend groups. Q 15 minute checks were done for safety.  R:Pt attends groups and interacts well with peers and staff. Pt is taking medication. Pt has no complaints.Pt receptive to treatment and safety maintained on unit.

## 2016-07-21 NOTE — Progress Notes (Signed)
  [facility] :  Will you be returning to the same living situation after discharge:  Yes,  return home with cousin. At discharge, do you have transportation home?: Yes,  cousin will pick patient up. Do you have the ability to pay for your medications: Yes,  Medicaid.  Release of information consent forms completed and in the chart;  Patient's signature needed at discharge.  Patient to Follow up at: Follow-up Information    [facility] Go on 07/24/2016.   Why:  Please plan to meet with Unk [facility]HArvey [facility] May [facility] at 7:15 AM for medication management and therapy. Contact Harvey at discharge to discuss plans at [phone].  Contact information: [address] [place] [address] [phone]           Next level of care provider [facility] Link:no  Safety Planning and Suicide Prevention discussed: Yes,  SPE completed with patient and cousin.  Have you used any form of tobacco in the last 30 days? (Cigarettes, Smokeless Tobacco, Cigars, and/or Pipes): Yes  Has patient been referred to the Quitline?: Patient refused referral  Patient has been referred for addiction treatment: Yes  Lynden OxfordKadijah R Aleks Nawrot, MSW, LCSW-A 07/21/2016, 10:44 AM

## 2016-07-21 NOTE — [facility] Group Notes (Signed)
[facility] LCSW Group Therapy Note  Date/Time: 07/21/2016; 9:30 AM  Type of Therapy/Topic:  Group Therapy:  Feelings about Diagnosis  Participation Level:  Active   Mood: Appropriate    Description of Group:    This group will allow patients to explore their thoughts and feelings about diagnoses they have received. Patients will be guided to explore their level of understanding and acceptance of these diagnoses. Facilitator will encourage patients to process their thoughts and feelings about the reactions of others to their diagnosis, and will guide patients in identifying ways to discuss their diagnosis with significant others in their lives. This group will be process-oriented, with patients participating in exploration of their own experiences as well as giving and receiving support and challenge from other group members.   Therapeutic Goals: 1. Patient will demonstrate understanding of diagnosis as evidence by identifying two or more symptoms of the disorder:  2. Patient will be able to express two feelings regarding the diagnosis 3. Patient will demonstrate ability to communicate their needs through discussion and/or role plays  Summary of Patient Progress: Patient identified feelings around diagnosis and ways to cope with feelings. Patient shared feelings of happiness, relieved, and calm. Patient stated that she is able to treat her diagnosis and take care of her family now that she knows her diagnoses. Patient identified coping mechanisms such as medication compliance and therapy to help with journey to recovery. CSW provided support to patient.   Therapeutic Modalities:   Cognitive Behavioral Therapy Brief Therapy Feelings Identification   Hampton AbbotKadijah Wadsworth Skolnick, MSW, LCSW-A 07/21/2016, 10:17AM

## 2016-07-21 NOTE — [facility] Suicide Risk Assessment (Signed)
[facility]   Principal Problem: Severe recurrent major depression with psychotic features [facility](HCC) Discharge Diagnoses:  Patient Active Problem List   Diagnosis Date Noted  . Severe recurrent major depression with psychotic features (HCC) [F33.3] 07/18/2016  . Depression, major, recurrent, severe with psychosis (HCC) [F33.3] 07/17/2016  . Normal labor [O80, Z37.9] 05/07/2016  . Labor and delivery, indication for care [O75.9] 04/19/2016  . Indication for care in labor and delivery, antepartum [O75.9] 04/06/2016  . Decreased fetal movement [O36.8190] 03/22/2016  . Abdominal pain in pregnancy [O26.899, R10.9] 01/21/2016  . Constipation during pregnancy in second trimester [O99.612, K59.00] 12/26/2015  . Overdose [T50.901A] 11/29/2015  . Pregnancy (I trimester) [Z34.90] 11/15/2015  . Cannabis use disorder, severe, dependence (HCC) [F12.20] 11/15/2015  . Tobacco use disorder [F17.200] 11/15/2015  . Asthma [J45.909] 11/15/2015  . Borderline personality disorder [F60.3] 11/15/2015  . PTSD (post-traumatic stress disorder) [F43.10] 11/15/2015  . Severe recurrent major depression without psychotic features (HCC) [F33.2] 11/13/2015    Total Time spent with patient: 30 minutes  Musculoskeletal: Strength & Muscle Tone: within normal limits Gait & Station: normal Patient leans: N/A  Psychiatric Specialty Exam: Review of Systems  Psychiatric/Behavioral: Positive for substance abuse.  All other systems reviewed and are negative.   Blood pressure 118/77, pulse (!) 121, temperature 98.5 F (36.9 C), temperature source Oral, resp. rate 16, height 5\' 2"  (1.575 m), weight 75.3 kg (166 lb), SpO2 100 %, unknown if currently breastfeeding.Body mass index is 30.36 kg/m.  General Appearance: Casual  Eye Contact::  Good  Speech:  Clear and Coherent409  Volume:  Normal  Mood:  Euthymic  Affect:  Appropriate  Thought Process:  Goal Directed and Descriptions of Associations:  Intact  Orientation:  Full (Time, Place, and Person)  Thought Content:  WDL  Suicidal Thoughts:  No  Homicidal Thoughts:  No  Memory:  Immediate;   Fair Recent;   Fair Remote;   Fair  Judgement:  Impaired  Insight:  Shallow  Psychomotor Activity:  Normal  Concentration:  Fair  Recall:  [employer]  Fund of Knowledge:Fair  Language: Fair  Akathisia:  No  Handed:  Right  AIMS (if indicated):     Assets:  Communication Skills Desire for Improvement Financial Resources/Insurance Housing Physical Health Resilience Social Support  Sleep:  Number of Hours: 7  Cognition: WNL  ADL's:  Intact   Mental Status Per Nursing Assessment::   On Admission:  Suicidal ideation indicated by patient  Demographic Factors:  Adolescent or young adult and Unemployed  Loss Factors: NA  Historical Factors: Prior suicide attempts, Family history of mental illness or substance abuse and Impulsivity  Risk Reduction Factors:   Responsible for children under 22 years of age, Living with another person, especially a relative and Positive social support  Continued Clinical Symptoms:  Depression:   Comorbid alcohol abuse/dependence Impulsivity Alcohol/Substance Abuse/Dependencies  Cognitive Features That Contribute To Risk:  None    Suicide Risk:  Minimal: No identifiable suicidal ideation.  Patients presenting with no risk factors but with morbid ruminations; may be classified as minimal risk based on the severity of the depressive symptoms    Plan Of Care/Follow-up recommendations:  Activity:  As tolerated. Diet:  Regular. Other:  Keep follow-up appointments.  Kristine LineaJolanta Fern Asmar, MD 07/21/2016, 10:29 AM

## 2016-07-21 NOTE — Plan of Care (Signed)
  Problem: Education: Goal: Ability to make informed decisions regarding treatment will improve Outcome: Progressing  Patient has ability to make informed decisions  

## 2016-07-21 NOTE — [facility] Group Notes (Signed)
[facility] Group Notes:  (Nursing/MHT/Case Management/Adjunct)  Date:  07/21/2016  Time:  6:21 AM  Type of Therapy:  Group Therapy  Participation Level:  Active  Participation Quality:  Appropriate  Affect:  Appropriate  Cognitive:  Appropriate  Insight:  Appropriate  Engagement in Group:  Engaged  Modes of Intervention:  n/a  Summary of Progress/Problems:  Kristy Reid 07/21/2016, 6:21 AM

## 2016-07-21 NOTE — Progress Notes (Signed)
D: Patient is aware of  Discharge this shift .Patient denies suicidal /homicidal ideations. Patient received all belongings brought in  A: No Storage medications. Writer reviewed Discharge Summary, Suicide Risk Assessment, and Transitional Record. Patient also received Prescriptions   And letter to job : Writer instructed on discharge criteria  .Marland Kitchen. Aware  Of follow up appointment . R: Patient left unit with no questions  Or concerns  With  family

## 2016-07-24 LAB — HEMOGLOBIN A1C
Hgb A1c MFr Bld: 5.2 % (ref 4.8–5.6)
MEAN PLASMA GLUCOSE: 103 mg/dL

## 2016-07-24 LAB — PROLACTIN: PROLACTIN: 9.2 ng/mL

## 2016-07-31 ENCOUNTER — Other Ambulatory Visit: Payer: Self-pay | Admitting: Advanced Practice Midwife

## 2016-08-02 ENCOUNTER — Emergency Department
Admission: EM | Admit: 2016-08-02 | Discharge: 2016-08-02 | Disposition: A | Discharge status: Home / Self Care | Payer: Medicaid Other | Attending: Student in an Organized Health Care Education/Training Program | Admitting: Student in an Organized Health Care Education/Training Program

## 2016-08-02 ENCOUNTER — Encounter: Payer: Self-pay | Admitting: Emergency Medicine

## 2016-08-02 ENCOUNTER — Emergency Department: Payer: Medicaid Other

## 2016-08-02 DIAGNOSIS — R112 Nausea with vomiting, unspecified: Secondary | ICD-10-CM | POA: Diagnosis present

## 2016-08-02 DIAGNOSIS — J45909 Unspecified asthma, uncomplicated: Secondary | ICD-10-CM | POA: Insufficient documentation

## 2016-08-02 DIAGNOSIS — F17210 Nicotine dependence, cigarettes, uncomplicated: Secondary | ICD-10-CM | POA: Insufficient documentation

## 2016-08-02 LAB — COMPREHENSIVE METABOLIC PANEL
ALBUMIN: 4.2 g/dL (ref 3.5–5.0)
ALT: 38 U/L (ref 14–54)
AST: 33 U/L (ref 15–41)
Alkaline Phosphatase: 88 U/L (ref 38–126)
Anion gap: 7 (ref 5–15)
BUN: 7 mg/dL (ref 6–20)
CHLORIDE: 109 mmol/L (ref 101–111)
CO2: 22 mmol/L (ref 22–32)
CREATININE: 0.85 mg/dL (ref 0.44–1.00)
Calcium: 9.5 mg/dL (ref 8.9–10.3)
GFR calc Af Amer: >60 mL/min (ref >60)
GFR calc non Af Amer: >60 mL/min (ref >60)
Glucose, Bld: 95 mg/dL (ref 65–99)
POTASSIUM: 3.8 mmol/L (ref 3.5–5.1)
SODIUM: 138 mmol/L (ref 135–145)
Total Bilirubin: 0.4 mg/dL (ref 0.3–1.2)
Total Protein: 8 g/dL (ref 6.5–8.1)

## 2016-08-02 LAB — URINALYSIS, COMPLETE (UACMP) WITH MICROSCOPIC
Bilirubin Urine: NEGATIVE
GLUCOSE, UA: NEGATIVE mg/dL
HGB URINE DIPSTICK: NEGATIVE
Ketones, ur: NEGATIVE mg/dL
Nitrite: NEGATIVE
PH: 6 (ref 5.0–8.0)
Protein, ur: NEGATIVE mg/dL
SPECIFIC GRAVITY, URINE: 1.006 (ref 1.005–1.030)

## 2016-08-02 LAB — CBC
HEMATOCRIT: 35.1 % (ref 35.0–47.0)
Hemoglobin: 11.6 g/dL — ABNORMAL LOW (ref 12.0–16.0)
MCH: 26.1 pg (ref 26.0–34.0)
MCHC: 33.2 g/dL (ref 32.0–36.0)
MCV: 78.7 fL — AB (ref 80.0–100.0)
PLATELETS: 359 10*3/uL (ref 150–440)
RBC: 4.46 MIL/uL (ref 3.80–5.20)
RDW: 17.9 % — AB (ref 11.5–14.5)
WBC: 7.3 10*3/uL (ref 3.6–11.0)

## 2016-08-02 LAB — POCT PREGNANCY, URINE: Preg Test, Ur: NEGATIVE

## 2016-08-02 LAB — LIPASE, BLOOD: LIPASE: 32 U/L (ref 11–51)

## 2016-08-02 MED ORDER — SODIUM CHLORIDE 0.9 % IV BOLUS (SEPSIS)
1000.0000 mL | Freq: Once | INTRAVENOUS | Status: AC
  Administered 2016-08-02: 1000 mL via INTRAVENOUS

## 2016-08-02 MED ORDER — ONDANSETRON 4 MG PO TBDP: 4.0000 mg | ORAL_TABLET | Freq: Three times a day (TID) | ORAL | 0 refills | Status: DC | PRN

## 2016-08-02 MED ORDER — NITROFURANTOIN MONOHYD MACRO 100 MG PO CAPS: 100.0000 mg | ORAL_CAPSULE | Freq: Once | ORAL | Status: DC

## 2016-08-02 MED ORDER — PROMETHAZINE HCL 25 MG/ML IJ SOLN
25.0000 mg | Freq: Once | INTRAMUSCULAR | Status: AC
  Administered 2016-08-02: 25 mg via INTRAVENOUS
  Filled 2016-08-02: qty 1

## 2016-08-02 NOTE — ED Notes (Signed)
NAD noted at time of D/C. Pt denies questions or concerns. Pt ambulatory to the lobby at this time.  

## 2016-08-02 NOTE — ED Triage Notes (Signed)
Nausea and vomiting x 3 days.  States unable to tolerate any PO x 3 days.

## 2016-08-02 NOTE — ED Provider Notes (Signed)
[facility] Emergency Department Provider Note    First MD Initiated Contact with Patient 08/02/16 1523     (approximate)  I have reviewed the triage vital signs and the nursing notes.   HISTORY  Chief Complaint Emesis and Nausea    HPI Kristy Reid is a 21 y.o. female history of borderline personality disorder as well as PTSD and anxiety presents with reportedly nausea and inability to tolerate oral hydration since Wednesday. Patient found sitting on the bench in her ER room in no acute distress testing on her phone. States that she hurts all over and has not been able to keep even ice chips down. States that she's thrown up 6 times today. No burning when she peas. No diarrhea. No vaginal discharge. No cough and shortness of breath. Has not tried anything at home to remedy her symptoms.   Past Medical History:  Diagnosis Date  . Anemia   . Anemia   . Anxiety   . Asthma   . Chronic back pain   . Depression   . Insomnia   . Personality disorder    "boarderline"  . PTSD (post-traumatic stress disorder)    Family History  Problem Relation Age of Onset  . Diabetes Mother   . Diabetes Maternal Grandmother   . Heart disease Maternal Grandmother   . Cancer Neg Hx   . Ovarian cancer Neg Hx    Past Surgical History:  Procedure Laterality Date  . BUNIONECTOMY Bilateral    feet  . wrist sugery     Patient Active Problem List   Diagnosis Date Noted  . Severe recurrent major depression with psychotic features (HCC) 07/18/2016  . Depression, major, recurrent, severe with psychosis (HCC) 07/17/2016  . Normal labor 05/07/2016  . Labor and delivery, indication for care 04/19/2016  . Indication for care in labor and delivery, antepartum 04/06/2016  . Decreased fetal movement 03/22/2016  . Abdominal pain in pregnancy 01/21/2016  . Constipation during pregnancy in second trimester 12/26/2015  . Overdose 11/29/2015  . Pregnancy (I trimester) 11/15/2015   . Cannabis use disorder, severe, dependence (HCC) 11/15/2015  . Tobacco use disorder 11/15/2015  . Asthma 11/15/2015  . Borderline personality disorder 11/15/2015  . PTSD (post-traumatic stress disorder) 11/15/2015  . Severe recurrent major depression without psychotic features (HCC) 11/13/2015      Prior to Admission medications   Medication Sig Start Date End Date Taking? Authorizing Provider  albuterol (PROVENTIL HFA;VENTOLIN HFA) 108 (90 Base) MCG/ACT inhaler Inhale 1-2 puffs into the lungs every 6 (six) hours as needed for wheezing or shortness of breath. 07/21/16   Pucilowska, Braulio Conte B, MD  hydrOXYzine (ATARAX/VISTARIL) 50 MG tablet Take 1 tablet (50 mg total) by mouth 3 (three) times daily as needed for anxiety. 07/21/16   Pucilowska, Braulio Conte B, MD  loratadine (CLARITIN) 10 MG tablet Take 1 tablet (10 mg total) by mouth daily. 07/22/16   Pucilowska, [name], MD  QUEtiapine (SEROQUEL) 300 MG tablet Take 1 tablet (300 mg total) by mouth at bedtime. 07/21/16   Pucilowska, Braulio Conte B, MD  sertraline (ZOLOFT) 100 MG tablet Take 1 tablet (100 mg total) by mouth daily. 07/22/16   Pucilowska, Braulio Conte B, MD  traZODone (DESYREL) 150 MG tablet Take 1 tablet (150 mg total) by mouth at bedtime as needed for sleep. 07/21/16   Pucilowska, Ellin Goodie, MD    Allergies Penicillins; Rocephin [ceftriaxone]; Zithromax [azithromycin]; and Lidocaine    Social History Social History  Substance Use Topics  .  Smoking status: Current Every Day Smoker    Types: Cigarettes    Last attempt to quit: 08/13/2015  . Smokeless tobacco: Never Used  . Alcohol use No    Review of Systems Patient denies headaches, rhinorrhea, blurry vision, numbness, shortness of breath, chest pain, edema, cough, abdominal pain, nausea, vomiting, diarrhea, dysuria, fevers, rashes or hallucinations unless otherwise stated above in HPI. ____________________________________________   PHYSICAL EXAM:  VITAL SIGNS: Vitals:   08/02/16  1444 08/02/16 1513  BP: 124/72 120/74  Pulse: 99 72  Resp: 16 16  Temp:      Constitutional: Alert and oriented. Well appearing and in no acute distress. Eyes: Conjunctivae are normal. PERRL. EOMI. Head: Atraumatic. Nose: No congestion/rhinnorhea. Mouth/Throat: Mucous membranes are moist.  Oropharynx non-erythematous. Neck: No stridor. Painless ROM. No cervical spine tenderness to palpation Hematological/Lymphatic/Immunilogical: No cervical lymphadenopathy. Cardiovascular: Normal rate, regular rhythm. Grossly normal heart sounds.  Good peripheral circulation. Respiratory: Normal respiratory effort.  No retractions. Lungs CTAB. Gastrointestinal: Soft and nontender. No distention. No abdominal bruits. No CVA tenderness. Musculoskeletal: No lower extremity tenderness nor edema.  No joint effusions. Neurologic:  Normal speech and language. No gross focal neurologic deficits are appreciated. No gait instability. Skin:  Skin is warm, dry and intact. No rash noted. Psychiatric: Mood and affect are normal. Speech and behavior are normal.  ____________________________________________   LABS (all labs ordered are listed, but only abnormal results are displayed)  Results for orders placed or performed during the hospital encounter of 08/02/16 (from the past 24 hour(s))  Lipase, blood     Status: None   Collection Time: 08/02/16  2:44 PM  Result Value Ref Range   Lipase 32 11 - 51 U/L  Comprehensive metabolic panel     Status: None   Collection Time: 08/02/16  2:44 PM  Result Value Ref Range   Sodium 138 135 - 145 mmol/L   Potassium 3.8 3.5 - 5.1 mmol/L   Chloride 109 101 - 111 mmol/L   CO2 22 22 - 32 mmol/L   Glucose, Bld 95 65 - 99 mg/dL   BUN 7 6 - 20 mg/dL   Creatinine, Ser 1.61 0.44 - 1.00 mg/dL   Calcium 9.5 8.9 - 09.6 mg/dL   Total Protein 8.0 6.5 - 8.1 g/dL   Albumin 4.2 3.5 - 5.0 g/dL   AST 33 15 - 41 U/L   ALT 38 14 - 54 U/L   Alkaline Phosphatase 88 38 - 126 U/L   Total  Bilirubin 0.4 0.3 - 1.2 mg/dL   GFR calc non Af Amer >60 >60 mL/min   GFR calc Af Amer >60 >60 mL/min   Anion gap 7 5 - 15  CBC     Status: Abnormal   Collection Time: 08/02/16  2:44 PM  Result Value Ref Range   WBC 7.3 3.6 - 11.0 K/uL   RBC 4.46 3.80 - 5.20 MIL/uL   Hemoglobin 11.6 (L) 12.0 - 16.0 g/dL   HCT 04.5 40.9 - 81.1 %   MCV 78.7 (L) 80.0 - 100.0 fL   MCH 26.1 26.0 - 34.0 pg   MCHC 33.2 32.0 - 36.0 g/dL   RDW 91.4 (H) 78.2 - 95.6 %   Platelets 359 150 - 440 K/uL  Pregnancy, urine POC     Status: None   Collection Time: 08/02/16  3:21 PM  Result Value Ref Range   Preg Test, Ur NEGATIVE NEGATIVE   ____________________________________________  EKG____________________________________________  RADIOLOGY I personally reviewed all radiographic images  ordered to evaluate for the above acute complaints and reviewed radiology reports and findings.  These findings were personally discussed with the patient.  Please see medical record for radiology report.   ____________________________________________   PROCEDURES  Procedure(s) performed:  Procedures    Critical Care performed: no ____________________________________________   INITIAL IMPRESSION / ASSESSMENT AND PLAN / ED COURSE  Pertinent labs & imaging results that were available during my care of the patient were reviewed by me and considered in my medical decision making (see chart for details).  DDX: dehydration, enteritis, gastritis, colitis  Kristy Reid is a 21 y.o. who presents to the ED with Report of nausea vomiting and inability to tolerate oral hydration for several days.  Patient is AFVSS in ED. Exam as above. Given current presentation have considered the above differential.  Based on her well appearance, normal vital signs and reassuring laboratory evaluation do not believe that this is possible that she'll give that many days without any oral hydration. We'll give her IV fluids for any component of  dehydration she may be having an antiemetics.  Clinical Course as of Aug 03 1806  Wynelle LinkSun Aug 02, 2016  1706 Patient reevaluated. Sleeping comfortably. Regarding bacteria found on urinalysis, most likely contaminant. Patient denies any symptoms of dysuria, frequency or flank pain. No change in color. We'll send for culture.  [PR]  1741 Patient tolerating crackers. Remains dynamically stable in no acute distress. The patient is stable for follow-up with her primary care physician.  Have discussed with the patient and available family all diagnostics and treatments performed thus far and all questions were answered to the best of my ability. The patient demonstrates understanding and agreement with plan.   [PR]    Clinical Course User Index [PR] Willy Eddyobinson, Jenniger Figiel, MD     ____________________________________________   FINAL CLINICAL IMPRESSION(S) / ED DIAGNOSES  Final diagnoses:  Non-intractable vomiting with nausea, unspecified vomiting type      NEW MEDICATIONS STARTED DURING THIS VISIT:  New Prescriptions   No medications on file     Note:  This document was prepared using Dragon voice recognition software and may include unintentional dictation errors.    Willy Eddyobinson, Collette Pescador, MD 08/02/16 [phone]

## 2016-08-02 NOTE — ED Notes (Signed)
Pt states that she has been vomiting since Wednesday, states that she has already vomited 6 times today, states that she has tried ice chips and has been unable to keep them down, pt reports constipation and unknown last BM, pt states that she has always had constipation and at times has went over a month without  BM. Pt denies having a GI specialist. Pt's abd is soft and non tender, pt reports feeling light headed, cramping in her extremities and lower abd, pt reports post pardom 3 months and states has only had one menstral cycle since delivery. No distress noted at this time, pt sitting on bench at her comfort despite the offered bed.

## 2016-08-12 ENCOUNTER — Emergency Department: Payer: Medicaid Other

## 2016-08-12 ENCOUNTER — Emergency Department
Admission: EM | Admit: 2016-08-12 | Discharge: 2016-08-12 | Disposition: A | Discharge status: Home / Self Care | Payer: Medicaid Other | Attending: Emergency Medicine | Admitting: Emergency Medicine

## 2016-08-12 DIAGNOSIS — N3090 Cystitis, unspecified without hematuria: Secondary | ICD-10-CM | POA: Diagnosis not present

## 2016-08-12 DIAGNOSIS — Z79899 Other long term (current) drug therapy: Secondary | ICD-10-CM | POA: Diagnosis not present

## 2016-08-12 DIAGNOSIS — F17210 Nicotine dependence, cigarettes, uncomplicated: Secondary | ICD-10-CM | POA: Diagnosis not present

## 2016-08-12 DIAGNOSIS — J45909 Unspecified asthma, uncomplicated: Secondary | ICD-10-CM | POA: Diagnosis not present

## 2016-08-12 DIAGNOSIS — R109 Unspecified abdominal pain: Secondary | ICD-10-CM | POA: Diagnosis not present

## 2016-08-12 DIAGNOSIS — R350 Frequency of micturition: Secondary | ICD-10-CM | POA: Diagnosis present

## 2016-08-12 LAB — URINALYSIS, COMPLETE (UACMP) WITH MICROSCOPIC
Bacteria, UA: NONE SEEN
Bilirubin Urine: NEGATIVE
GLUCOSE, UA: NEGATIVE mg/dL
KETONES UR: NEGATIVE mg/dL
Nitrite: NEGATIVE
PH: 5 (ref 5.0–8.0)
PROTEIN: NEGATIVE mg/dL
Specific Gravity, Urine: 1.019 (ref 1.005–1.030)

## 2016-08-12 LAB — POCT PREGNANCY, URINE: Preg Test, Ur: NEGATIVE

## 2016-08-12 MED ORDER — SULFAMETHOXAZOLE-TRIMETHOPRIM 800-160 MG PO TABS: 1.0000 | ORAL_TABLET | Freq: Two times a day (BID) | ORAL | 0 refills | Status: AC

## 2016-08-12 NOTE — ED Notes (Signed)
See triage note  States she is having some urinary sx's  Feels like she is not able to empty her bladder  Started couple of days ago  No fever or vaginal discharge

## 2016-08-12 NOTE — ED Notes (Signed)
Pt ambulatory to CT

## 2016-08-12 NOTE — ED Triage Notes (Signed)
Pt states that she has not been able to fully empty her bladder X 2 days. Denies burning or pain with urination. LMP was 2 months ago. Pt has not taken a pregnancy test. Pt alert and oriented X4, active, cooperative, pt in NAD. RR even and unlabored, color WNL.

## 2016-08-12 NOTE — ED Provider Notes (Signed)
[facility] Emergency Department Provider Note  ____________________________________________  Time seen: Approximately 4:08 PM  I have reviewed the triage vital signs and the nursing notes.   HISTORY  Chief Complaint Urinary Retention    HPI [name] is a 21 y.o. female presenting to the emergency department with increased urinary frequency, bilateral flank pain and urinary retention for the past 3 days. Patient states that she has felt "more hot been usual". She denies chills. She has not evaluated her temperature at home. She denies hematuria. No emesis or abdominal pain. No feelings of bladder fullness. Last UTI was Feb 2018.  No changes in vaginal discharge. No history of pyelonephritis or nephrolithiasis. No dyspareunia. No alleviating measures have been attempted.    Past Medical History:  Diagnosis Date  . Anemia   . Anemia   . Anxiety   . Asthma   . Chronic back pain   . Depression   . Insomnia   . Personality disorder    "boarderline"  . PTSD (post-traumatic stress disorder)     Patient Active Problem List   Diagnosis Date Noted  . Severe recurrent major depression with psychotic features (HCC) 07/18/2016  . Depression, major, recurrent, severe with psychosis (HCC) 07/17/2016  . Normal labor 05/07/2016  . Labor and delivery, indication for care 04/19/2016  . Indication for care in labor and delivery, antepartum 04/06/2016  . Decreased fetal movement 03/22/2016  . Abdominal pain in pregnancy 01/21/2016  . Constipation during pregnancy in second trimester 12/26/2015  . Overdose 11/29/2015  . Pregnancy (I trimester) 11/15/2015  . Cannabis use disorder, severe, dependence (HCC) 11/15/2015  . Tobacco use disorder 11/15/2015  . Asthma 11/15/2015  . Borderline personality disorder 11/15/2015  . PTSD (post-traumatic stress disorder) 11/15/2015  . Severe recurrent major depression without psychotic features (HCC) 11/13/2015    Past  Surgical History:  Procedure Laterality Date  . BUNIONECTOMY Bilateral    feet  . wrist sugery      Prior to Admission medications   Medication Sig Start Date End Date Taking? Authorizing Provider  albuterol (PROVENTIL HFA;VENTOLIN HFA) 108 (90 Base) MCG/ACT inhaler Inhale 1-2 puffs into the lungs every 6 (six) hours as needed for wheezing or shortness of breath. 07/21/16   Pucilowska, Ellin GoodieJolanta B, MD  cyclobenzaprine (FLEXERIL) 5 MG tablet take 1 tablet by mouth twice a day if needed 08/04/16   Tresea MallGledhill, Jane, CNM  hydrOXYzine (ATARAX/VISTARIL) 50 MG tablet Take 1 tablet (50 mg total) by mouth 3 (three) times daily as needed for anxiety. 07/21/16   Pucilowska, Braulio ConteJolanta B, MD  loratadine (CLARITIN) 10 MG tablet Take 1 tablet (10 mg total) by mouth daily. 07/22/16   Pucilowska, [name], MD  ondansetron (ZOFRAN ODT) 4 MG disintegrating tablet Take 1 tablet (4 mg total) by mouth every 8 (eight) hours as needed for nausea or vomiting. 08/02/16   Willy Eddyobinson, Patrick, MD  QUEtiapine (SEROQUEL) 300 MG tablet Take 1 tablet (300 mg total) by mouth at bedtime. 07/21/16   Pucilowska, Braulio ConteJolanta B, MD  sertraline (ZOLOFT) 100 MG tablet Take 1 tablet (100 mg total) by mouth daily. 07/22/16   Pucilowska, [name], MD  sulfamethoxazole-trimethoprim (BACTRIM DS,SEPTRA DS) 800-160 MG tablet Take 1 tablet by mouth 2 (two) times daily. 08/12/16 08/19/16  Orvil FeilWoods, [name] M, PA-C  traZODone (DESYREL) 150 MG tablet Take 1 tablet (150 mg total) by mouth at bedtime as needed for sleep. 07/21/16   Pucilowska, Ellin GoodieJolanta B, MD    Allergies Penicillins; Rocephin [ceftriaxone];  Zithromax [azithromycin]; and Lidocaine  Family History  Problem Relation Age of Onset  . Diabetes Mother   . Diabetes Maternal Grandmother   . Heart disease Maternal Grandmother   . Cancer Neg Hx   . Ovarian cancer Neg Hx     Social History Social History  Substance Use Topics  . Smoking status: Current Every Day Smoker    Types: Cigarettes    Last attempt to  quit: 08/13/2015  . Smokeless tobacco: Never Used  . Alcohol use No     Review of Systems  Constitutional: No fever/chills Eyes: No visual changes. No discharge ENT: No upper respiratory complaints. Cardiovascular: no chest pain. Respiratory: no cough. No SOB. Gastrointestinal: No abdominal pain.  No nausea, no vomiting.  No diarrhea.  No constipation. Genitourinary: Patient has increased urinary frequency and bilateral flank pain. Musculoskeletal: Negative for musculoskeletal pain. Skin: Negative for rash, abrasions, lacerations, ecchymosis. Neurological: Negative for headaches, focal weakness or numbness.   ____________________________________________   PHYSICAL EXAM:  VITAL SIGNS: ED Triage Vitals  Enc Vitals Group     BP 08/12/16 1504 (!) 144/67     Pulse Rate 08/12/16 1504 (!) 109     Resp 08/12/16 1504 18     Temp 08/12/16 1504 98.3 F (36.8 C)     Temp Source 08/12/16 1504 Oral     SpO2 08/12/16 1504 96 %     Weight 08/12/16 1505 175 lb (79.4 kg)     Height 08/12/16 1505 5\' 2"  (1.575 m)     Head Circumference --      Peak Flow --      Pain Score 08/12/16 1504 0     Pain Loc --      Pain Edu? --      Excl. in GC? --      Constitutional: Alert and oriented. Well appearing and in no acute distress. Eyes: Conjunctivae are normal. PERRL. EOMI. Head: Atraumatic. Cardiovascular: Normal rate, regular rhythm. Normal S1 and S2.  Good peripheral circulation. Respiratory: Normal respiratory effort without tachypnea or retractions. Lungs CTAB. Good air entry to the bases with no decreased or absent breath sounds. Gastrointestinal: Bowel sounds 4 quadrants. Soft and nontender to palpation. No guarding or rigidity. No palpable masses. No distention. Patient has bilateral CVA tenderness. Musculoskeletal: Full range of motion to all extremities. No gross deformities appreciated. Neurologic:  Normal speech and language. No gross focal neurologic deficits are appreciated.   Skin:  Skin is warm, dry and intact. No rash noted. Psychiatric: Mood and affect are normal. Speech and behavior are normal. Patient exhibits appropriate insight and judgement.   ____________________________________________   LABS (all labs ordered are listed, but only abnormal results are displayed)  Labs Reviewed  URINALYSIS, COMPLETE (UACMP) WITH MICROSCOPIC - Abnormal; Notable for the following:       Result Value   Color, Urine YELLOW (*)    APPearance HAZY (*)    Hgb urine dipstick MODERATE (*)    Leukocytes, UA LARGE (*)    Squamous Epithelial / LPF 0-5 (*)    All other components within normal limits  POC URINE PREG, ED  POCT PREGNANCY, URINE   ____________________________________________  EKG   ____________________________________________  RADIOLOGY   Ct Renal Stone Study  Result Date: 08/12/2016 CLINICAL DATA:  Evaluate for hydronephrosis. EXAM: CT ABDOMEN AND PELVIS WITHOUT CONTRAST TECHNIQUE: Multidetector CT imaging of the abdomen and pelvis was performed following the standard protocol without IV contrast. COMPARISON:  02/14/2015 FINDINGS: Lower chest: 3 mm  left lower lobe lung nodule is unchanged from previous exam compatible with a benign abnormality. Hepatobiliary: No focal liver abnormality is seen. No gallstones, gallbladder wall thickening, or biliary dilatation. Pancreas: Unremarkable. No pancreatic ductal dilatation or surrounding inflammatory changes. Spleen: Normal in size without focal abnormality. Adrenals/Urinary Tract: The adrenal glands are normal. Normal appearance of both kidneys. No kidney stones or hydronephrosis. Unremarkable appearance of the urinary bladder. Stomach/Bowel: The stomach an the small bowel loops have a normal course and caliber. The appendix is not visualized separate from the right lower quadrant bowel loops. No pathologic dilatation of the colon. Vascular/Lymphatic: No significant vascular findings are present. No enlarged  abdominal or pelvic lymph nodes. Reproductive: Uterus and bilateral adnexa are unremarkable. Other:  Small fat containing periumbilical hernia. Musculoskeletal: No acute or significant osseous findings. IMPRESSION: 1. No acute findings within the abdomen or pelvis. 2. No hydronephrosis. Electronically Signed   By: Signa Kell M.D.   On: 08/12/2016 18:56    ____________________________________________    PROCEDURES  Procedure(s) performed:    Procedures    Medications - No data to display   ____________________________________________   INITIAL IMPRESSION / ASSESSMENT AND PLAN / ED COURSE  Pertinent labs & imaging results that were available during my care of the patient were reviewed by me and considered in my medical decision making (see chart for details).  Review of the [place] CSRS was performed in accordance of the [facility] prior to dispensing any controlled drugs.     Assessment and plan: Acute cystitis: Patient presents to the emergency department with increased urinary frequency and bilateral flank pain for the past 3 days. Urinalysis was concerning for cystitis. CT renal stone study revealed no hydronephrosis or renal stones. Patient was discharged with Bactrim. Patient was advised to follow-up with urology as needed. All patient questions were answered.     ____________________________________________  FINAL CLINICAL IMPRESSION(S) / ED DIAGNOSES  Final diagnoses:  Cystitis      NEW MEDICATIONS STARTED DURING THIS VISIT:  New Prescriptions   SULFAMETHOXAZOLE-TRIMETHOPRIM (BACTRIM DS,SEPTRA DS) 800-160 MG TABLET    Take 1 tablet by mouth 2 (two) times daily.        This chart was dictated using voice recognition software/Dragon. Despite best efforts to proofread, errors can occur which can change the meaning. Any change was purely unintentional.    Orvil Feil, PA-C 08/12/16 1916    Loleta Rose, MD 08/12/16 [phone]

## 2016-08-27 ENCOUNTER — Ambulatory Visit: Payer: Medicaid Other

## 2016-09-07 ENCOUNTER — Ambulatory Visit: Payer: Self-pay

## 2016-09-08 ENCOUNTER — Ambulatory Visit: Payer: Self-pay

## 2016-09-25 ENCOUNTER — Encounter: Payer: Self-pay | Admitting: Emergency Medicine

## 2016-09-25 ENCOUNTER — Emergency Department
Admission: EM | Admit: 2016-09-25 | Discharge: 2016-09-25 | Disposition: A | Discharge status: Home / Self Care | Payer: Medicaid Other | Attending: Emergency Medicine | Admitting: Emergency Medicine

## 2016-09-25 DIAGNOSIS — Z79899 Other long term (current) drug therapy: Secondary | ICD-10-CM | POA: Diagnosis not present

## 2016-09-25 DIAGNOSIS — Z3202 Encounter for pregnancy test, result negative: Secondary | ICD-10-CM | POA: Diagnosis not present

## 2016-09-25 DIAGNOSIS — F17210 Nicotine dependence, cigarettes, uncomplicated: Secondary | ICD-10-CM | POA: Diagnosis not present

## 2016-09-25 DIAGNOSIS — J45909 Unspecified asthma, uncomplicated: Secondary | ICD-10-CM | POA: Diagnosis not present

## 2016-09-25 DIAGNOSIS — R102 Pelvic and perineal pain: Secondary | ICD-10-CM | POA: Diagnosis present

## 2016-09-25 DIAGNOSIS — N3090 Cystitis, unspecified without hematuria: Secondary | ICD-10-CM | POA: Insufficient documentation

## 2016-09-25 LAB — MONONUCLEOSIS SCREEN: MONO SCREEN: NEGATIVE

## 2016-09-25 LAB — URINALYSIS, COMPLETE (UACMP) WITH MICROSCOPIC
Bilirubin Urine: NEGATIVE
Glucose, UA: NEGATIVE mg/dL
KETONES UR: NEGATIVE mg/dL
Nitrite: POSITIVE — AB
PH: 5 (ref 5.0–8.0)
Protein, ur: NEGATIVE mg/dL
SPECIFIC GRAVITY, URINE: 1.021 (ref 1.005–1.030)

## 2016-09-25 LAB — POCT PREGNANCY, URINE: Preg Test, Ur: NEGATIVE

## 2016-09-25 MED ORDER — SULFAMETHOXAZOLE-TRIMETHOPRIM 800-160 MG PO TABS: 1.0000 | ORAL_TABLET | Freq: Two times a day (BID) | ORAL | 0 refills | Status: DC

## 2016-09-25 NOTE — ED Provider Notes (Signed)
[facility] Emergency Department Provider Note   ____________________________________________   I have reviewed the triage vital signs and the nursing notes.   HISTORY  Chief Complaint Pelvic Pain    HPI Kristy Reid is a 21 y.o. female presents to the emergency department after her PCP sent her for a mononucleosis screening and she is concerned that she may be pregnant. Patient offers no complaints or symptoms at this time. She reports one of her children have and a positive Monospot test. Patient denies fever, chills, headache, vision changes, chest pain, chest tightness, shortness of breath, abdominal pain, nausea and vomiting.  Past Medical History:  Diagnosis Date  . Anemia   . Anemia   . Anxiety   . Asthma   . Chronic back pain   . Depression   . Insomnia   . Personality disorder    "boarderline"  . PTSD (post-traumatic stress disorder)     Patient Active Problem List   Diagnosis Date Noted  . Severe recurrent major depression with psychotic features (HCC) 07/18/2016  . Depression, major, recurrent, severe with psychosis (HCC) 07/17/2016  . Normal labor 05/07/2016  . Labor and delivery, indication for care 04/19/2016  . Indication for care in labor and delivery, antepartum 04/06/2016  . Decreased fetal movement 03/22/2016  . Abdominal pain in pregnancy 01/21/2016  . Constipation during pregnancy in second trimester 12/26/2015  . Overdose 11/29/2015  . Pregnancy (I trimester) 11/15/2015  . Cannabis use disorder, severe, dependence (HCC) 11/15/2015  . Tobacco use disorder 11/15/2015  . Asthma 11/15/2015  . Borderline personality disorder 11/15/2015  . PTSD (post-traumatic stress disorder) 11/15/2015  . Severe recurrent major depression without psychotic features (HCC) 11/13/2015    Past Surgical History:  Procedure Laterality Date  . BUNIONECTOMY Bilateral    feet  . wrist sugery      Prior to Admission medications     Medication Sig Start Date End Date Taking? Authorizing Provider  albuterol (PROVENTIL HFA;VENTOLIN HFA) 108 (90 Base) MCG/ACT inhaler Inhale 1-2 puffs into the lungs every 6 (six) hours as needed for wheezing or shortness of breath. 07/21/16   Pucilowska, Ellin GoodieJolanta B, MD  cyclobenzaprine (FLEXERIL) 5 MG tablet take 1 tablet by mouth twice a day if needed 08/04/16   Tresea MallGledhill, Jane, CNM  hydrOXYzine (ATARAX/VISTARIL) 50 MG tablet Take 1 tablet (50 mg total) by mouth 3 (three) times daily as needed for anxiety. 07/21/16   Pucilowska, Braulio ConteJolanta B, MD  loratadine (CLARITIN) 10 MG tablet Take 1 tablet (10 mg total) by mouth daily. 07/22/16   Pucilowska, [name], MD  ondansetron (ZOFRAN ODT) 4 MG disintegrating tablet Take 1 tablet (4 mg total) by mouth every 8 (eight) hours as needed for nausea or vomiting. 08/02/16   Willy Eddyobinson, Patrick, MD  QUEtiapine (SEROQUEL) 300 MG tablet Take 1 tablet (300 mg total) by mouth at bedtime. 07/21/16   Pucilowska, Braulio ConteJolanta B, MD  sertraline (ZOLOFT) 100 MG tablet Take 1 tablet (100 mg total) by mouth daily. 07/22/16   Pucilowska, [name], MD  sulfamethoxazole-trimethoprim (BACTRIM DS,SEPTRA DS) 800-160 MG tablet Take 1 tablet by mouth 2 (two) times daily. 09/25/16   Marelly Wehrman M, PA-C  traZODone (DESYREL) 150 MG tablet Take 1 tablet (150 mg total) by mouth at bedtime as needed for sleep. 07/21/16   Pucilowska, Ellin GoodieJolanta B, MD    Allergies Penicillins; Rocephin [ceftriaxone]; Zithromax [azithromycin]; and Lidocaine  Family History  Problem Relation Age of Onset  . Diabetes Mother   .  Diabetes Maternal Grandmother   . Heart disease Maternal Grandmother   . Cancer Neg Hx   . Ovarian cancer Neg Hx     Social History Social History  Substance Use Topics  . Smoking status: Current Every Day Smoker    Types: Cigarettes    Last attempt to quit: 08/13/2015  . Smokeless tobacco: Never Used  . Alcohol use No    Review of Systems Constitutional: Negative for fever/chills Eyes:  No visual changes. ENT:  Negative for sore throat and for difficulty swallowing Cardiovascular: Denies chest pain. Respiratory: Denies cough Denies shortness of breath. Gastrointestinal: No abdominal pain.  No nausea, vomiting, diarrhea. Genitourinary: Negative for dysuria, irritation or increased frequency.  Skin:Negative for rash. Neurological: Negative for headaches. ____________________________________________   PHYSICAL EXAM:  VITAL SIGNS: ED Triage Vitals  Enc Vitals Group     BP 09/25/16 1241 131/79     Pulse Rate 09/25/16 1241 (!) 101     Resp 09/25/16 1241 20     Temp 09/25/16 1241 98.6 F (37 C)     Temp Source 09/25/16 1241 Oral     SpO2 09/25/16 1241 100 %     Weight 09/25/16 1241 184 lb (83.5 kg)     Height --      Head Circumference --      Peak Flow --      Pain Score 09/25/16 1240 5     Pain Loc --      Pain Edu? --      Excl. in GC? --     Constitutional: Alert and oriented. Well appearing and in no acute distress.  Head: Normocephalic and atraumatic. Eyes: Conjunctivae are normal. PERRL.  Nose: No congestion/rhinorrhea Mouth/Throat: Mucous membranes are moist.  Cardiovascular: Normal rate, regular rhythm. Normal distal pulses. Respiratory: Normal respiratory effort. Gastrointestinal: Soft and nontender. No distention. Genitourinary: Negative for dysuria, irritation or increased frequency. Negative flank pain, negative hematuria. Negative vaginal or urethral discharge. Musculoskeletal: Nontender with normal range of motion in all extremities. Neurologic: Normal speech and language. Skin:  Skin is warm, dry and intact. No rash noted. Psychiatric: Mood and affect are normal.  ____________________________________________   LABS (all labs ordered are listed, but only abnormal results are displayed)  Labs Reviewed  URINALYSIS, COMPLETE (UACMP) WITH MICROSCOPIC - Abnormal; Notable for the following:       Result Value   Color, Urine AMBER (*)     APPearance CLOUDY (*)    Hgb urine dipstick MODERATE (*)    Nitrite POSITIVE (*)    Leukocytes, UA LARGE (*)    Bacteria, UA MANY (*)    Squamous Epithelial / LPF 0-5 (*)    All other components within normal limits  MONONUCLEOSIS SCREEN  POC URINE PREG, ED  POCT PREGNANCY, URINE   ____________________________________________  EKG none ____________________________________________  RADIOLOGY none ____________________________________________   PROCEDURES  Procedure(s) performed: no    Critical Care performed: no ____________________________________________   INITIAL IMPRESSION / ASSESSMENT AND PLAN / ED COURSE  Pertinent labs & imaging results that were available during my care of the patient were reviewed by me and considered in my medical decision making (see chart for details).  Patient presents to emergency department questioning if she is pregnant and her PCP sent her for a mononucleosis screen. History, physical exam findings, and labs revealed negative pregnancy and mononucleosis with positive urinalysis for cystitis. Patient denied any dysuria, urethral/vaginal irritation, flank pain or increased urine frequency. Patient will be prescribed Bactrim for antibody  coverage. Patient advised to follow up with PCP as needed or return to the emergency department if symptoms return or worsen. Patient informed of clinical course, understand medical decision-making process, and agree with plan.  Patient was advised to follow up with PCP as needed and was also advised to return to the emergency department for symptoms that change or worsen.   _____________________________________   FINAL CLINICAL IMPRESSION(S) / ED DIAGNOSES  Final diagnoses:  Cystitis       NEW MEDICATIONS STARTED DURING THIS VISIT:  Discharge Medication List as of 09/25/2016  3:09 PM    START taking these medications   Details  sulfamethoxazole-trimethoprim (BACTRIM DS,SEPTRA DS) 800-160 MG  tablet Take 1 tablet by mouth 2 (two) times daily., Starting Fri 09/25/2016, Print         Note:  This document was prepared using Dragon voice recognition software and may include unintentional dictation errors.    Percell Boston 09/25/16 1910    Phineas Semen, MD 09/26/16 [phone]

## 2016-09-25 NOTE — ED Triage Notes (Signed)
Pt presents with pelvic pain for one week. Pt wants a preg test.

## 2016-09-27 IMAGING — CR DG CHEST 2V
1 series · 2 of 2 positions shown · non-contrast
Comparison: Radiographs 08/29/2015, CT 08/31/2015

CLINICAL DATA: Chest pain after fall down stairs at home. Patient
is 14 weeks pregnant, patient was double shielded.

EXAM:
CHEST  2 VIEW

[Series 1: dg chest 2 view · 0.14mm/px · 2 of 2 slices shown]
[im 1/2]
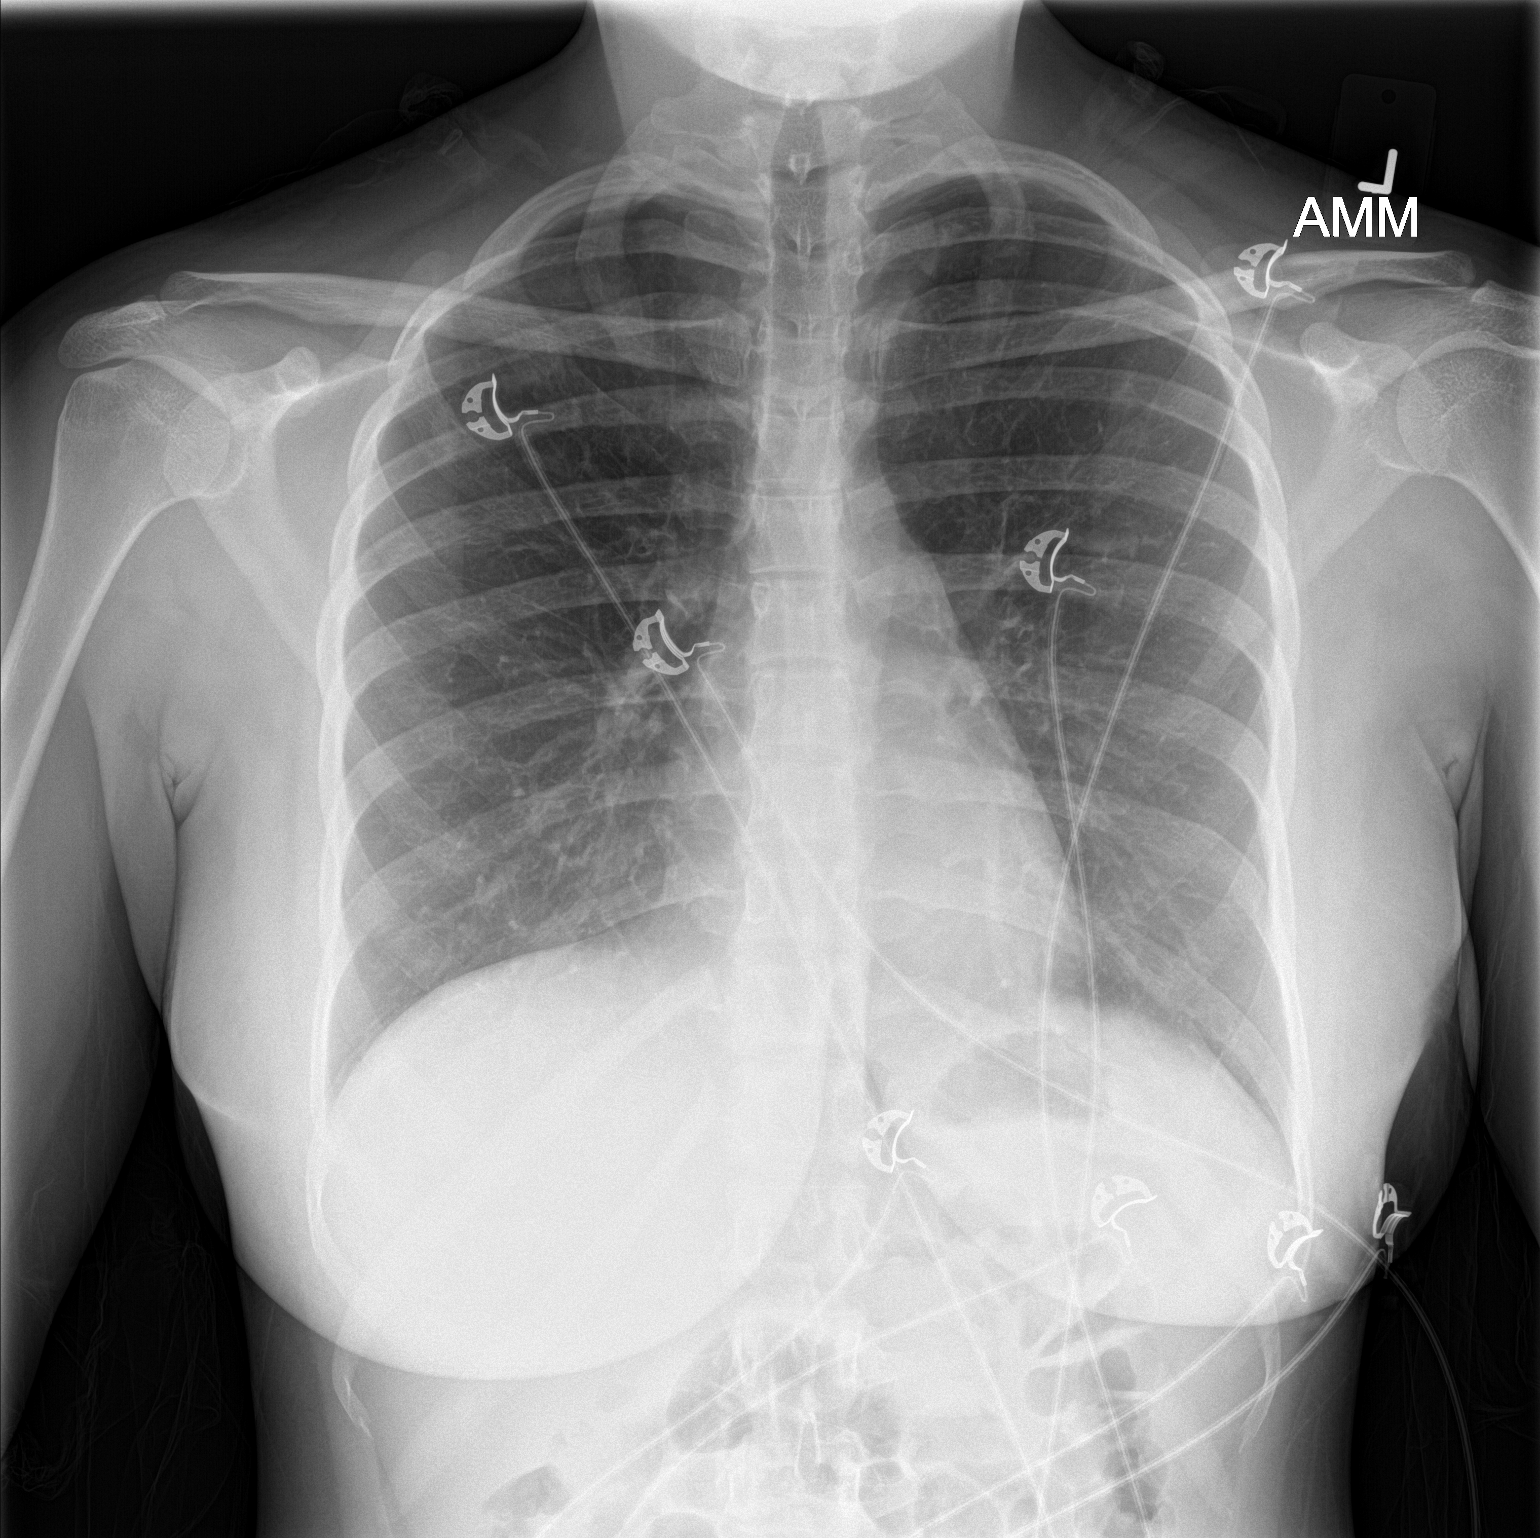
[im 2/2]
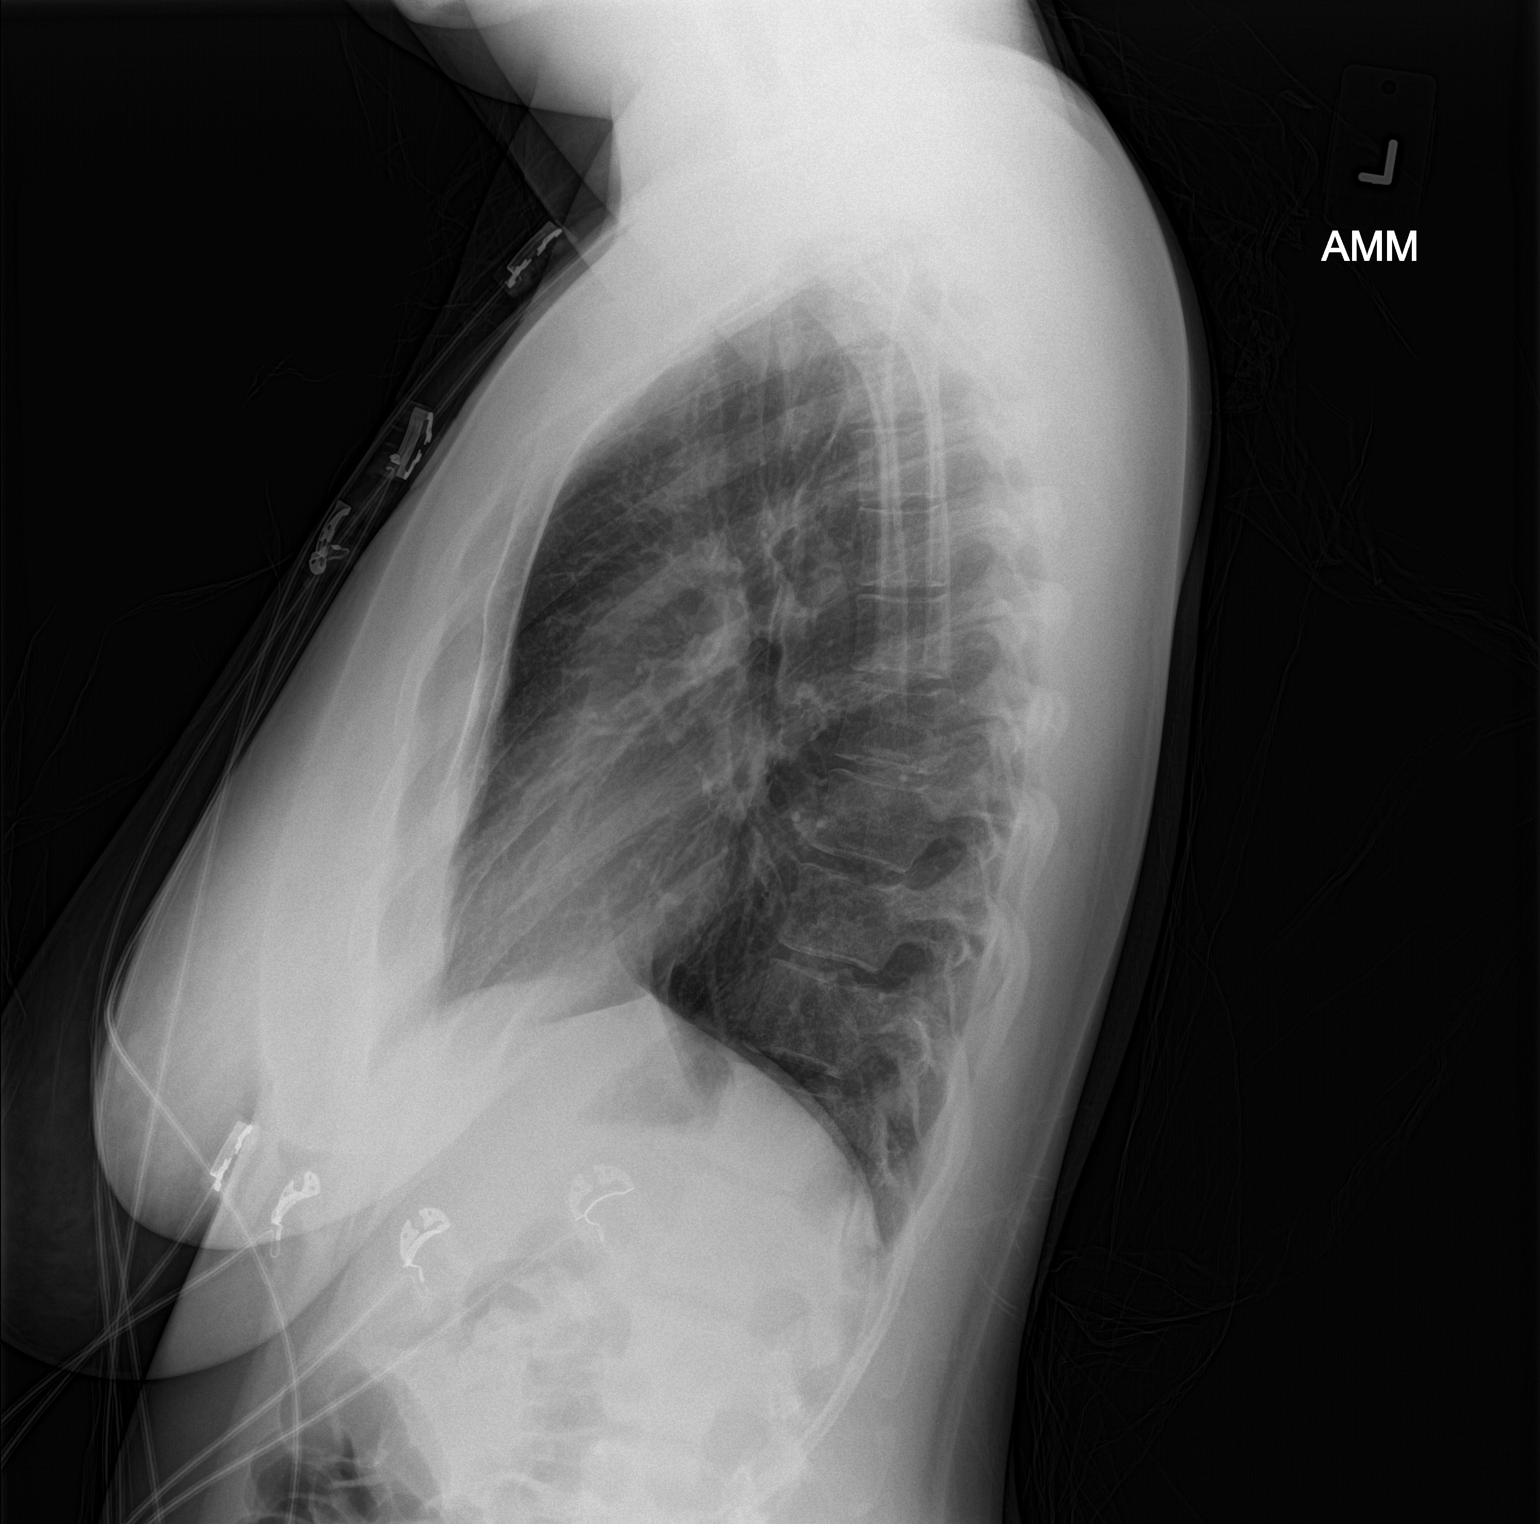

[2 of 2 positions shown; findings below may reference images not displayed]

FINDINGS: The cardiomediastinal contours are normal. The lungs are clear.
Pulmonary vasculature is normal. No consolidation, pleural effusion,
or pneumothorax. No acute osseous abnormalities are seen. Minimal
pectus deformity is stable.
IMPRESSION: No acute or traumatic abnormality.

## 2016-10-08 ENCOUNTER — Emergency Department: Payer: Medicaid Other

## 2016-10-08 ENCOUNTER — Emergency Department
Admission: EM | Admit: 2016-10-08 | Discharge: 2016-10-08 | Disposition: A | Discharge status: Home / Self Care | Payer: Medicaid Other | Attending: Emergency Medicine | Admitting: Emergency Medicine

## 2016-10-08 DIAGNOSIS — Y929 Unspecified place or not applicable: Secondary | ICD-10-CM | POA: Diagnosis not present

## 2016-10-08 DIAGNOSIS — Z79899 Other long term (current) drug therapy: Secondary | ICD-10-CM | POA: Diagnosis not present

## 2016-10-08 DIAGNOSIS — W010XXA Fall on same level from slipping, tripping and stumbling without subsequent striking against object, initial encounter: Secondary | ICD-10-CM | POA: Insufficient documentation

## 2016-10-08 DIAGNOSIS — F17210 Nicotine dependence, cigarettes, uncomplicated: Secondary | ICD-10-CM | POA: Diagnosis not present

## 2016-10-08 DIAGNOSIS — Y998 Other external cause status: Secondary | ICD-10-CM | POA: Insufficient documentation

## 2016-10-08 DIAGNOSIS — J45909 Unspecified asthma, uncomplicated: Secondary | ICD-10-CM | POA: Insufficient documentation

## 2016-10-08 DIAGNOSIS — S4991XA Unspecified injury of right shoulder and upper arm, initial encounter: Secondary | ICD-10-CM | POA: Diagnosis present

## 2016-10-08 DIAGNOSIS — Y9301 Activity, walking, marching and hiking: Secondary | ICD-10-CM | POA: Insufficient documentation

## 2016-10-08 DIAGNOSIS — S40011A Contusion of right shoulder, initial encounter: Secondary | ICD-10-CM

## 2016-10-08 MED ORDER — NAPROXEN 500 MG PO TABS
500.0000 mg | ORAL_TABLET | Freq: Once | ORAL | Status: AC
  Administered 2016-10-08: 500 mg via ORAL
  Filled 2016-10-08: qty 1

## 2016-10-08 MED ORDER — NAPROXEN 500 MG PO TABS: 500.0000 mg | ORAL_TABLET | Freq: Two times a day (BID) | ORAL | Status: DC

## 2016-10-08 MED ORDER — TRAMADOL HCL 50 MG PO TABS: 50.0000 mg | ORAL_TABLET | Freq: Four times a day (QID) | ORAL | 0 refills | Status: DC | PRN

## 2016-10-08 NOTE — ED Provider Notes (Signed)
[facility] Emergency Department Provider Note   ____________________________________________   First MD Initiated Contact with Patient 10/08/16 0818     (approximate)  I have reviewed the triage vital signs and the nursing notes.   HISTORY  Chief Complaint Shoulder Pain    HPI Kristy Reid is a 21 y.o. female patient complaining of right shoulder pain secondary to a trip and fall approximately one half hours ago. Patient states pain is a superior aspect of her humerus and also at this/each joint. Patient stated pain increases with adduction or attempting overhead reaching.Patient rates the pain as 8/10. Patient described a pain as "sharp". No palliative measures for complaint. Upon injuring exam room patient is eating with use of the right upper extremity. Patient is right-hand dominant.  Past Medical History:  Diagnosis Date  . Anemia   . Anemia   . Anxiety   . Asthma   . Chronic back pain   . Depression   . Insomnia   . Personality disorder    "boarderline"  . PTSD (post-traumatic stress disorder)     Patient Active Problem List   Diagnosis Date Noted  . Severe recurrent major depression with psychotic features (HCC) 07/18/2016  . Depression, major, recurrent, severe with psychosis (HCC) 07/17/2016  . Normal labor 05/07/2016  . Labor and delivery, indication for care 04/19/2016  . Indication for care in labor and delivery, antepartum 04/06/2016  . Decreased fetal movement 03/22/2016  . Abdominal pain in pregnancy 01/21/2016  . Constipation during pregnancy in second trimester 12/26/2015  . Overdose 11/29/2015  . Pregnancy (I trimester) 11/15/2015  . Cannabis use disorder, severe, dependence (HCC) 11/15/2015  . Tobacco use disorder 11/15/2015  . Asthma 11/15/2015  . Borderline personality disorder 11/15/2015  . PTSD (post-traumatic stress disorder) 11/15/2015  . Severe recurrent major depression without psychotic features (HCC)  11/13/2015    Past Surgical History:  Procedure Laterality Date  . BUNIONECTOMY Bilateral    feet  . wrist sugery      Prior to Admission medications   Medication Sig Start Date End Date Taking? Authorizing Provider  albuterol (PROVENTIL HFA;VENTOLIN HFA) 108 (90 Base) MCG/ACT inhaler Inhale 1-2 puffs into the lungs every 6 (six) hours as needed for wheezing or shortness of breath. 07/21/16   Pucilowska, Ellin GoodieJolanta B, MD  cyclobenzaprine (FLEXERIL) 5 MG tablet take 1 tablet by mouth twice a day if needed 08/04/16   Tresea MallGledhill, Jane, CNM  hydrOXYzine (ATARAX/VISTARIL) 50 MG tablet Take 1 tablet (50 mg total) by mouth 3 (three) times daily as needed for anxiety. 07/21/16   Pucilowska, Braulio ConteJolanta B, MD  loratadine (CLARITIN) 10 MG tablet Take 1 tablet (10 mg total) by mouth daily. 07/22/16   Pucilowska, Ellin GoodieJolanta B, MD  naproxen (NAPROSYN) 500 MG tablet Take 1 tablet (500 mg total) by mouth 2 (two) times daily with a meal. 10/08/16   Joni ReiningSmith, Nazire Fruth K, PA-C  ondansetron (ZOFRAN ODT) 4 MG disintegrating tablet Take 1 tablet (4 mg total) by mouth every 8 (eight) hours as needed for nausea or vomiting. 08/02/16   Willy Eddyobinson, Patrick, MD  QUEtiapine (SEROQUEL) 300 MG tablet Take 1 tablet (300 mg total) by mouth at bedtime. 07/21/16   Pucilowska, Braulio ConteJolanta B, MD  sertraline (ZOLOFT) 100 MG tablet Take 1 tablet (100 mg total) by mouth daily. 07/22/16   Pucilowska, [name], MD  sulfamethoxazole-trimethoprim (BACTRIM DS,SEPTRA DS) 800-160 MG tablet Take 1 tablet by mouth 2 (two) times daily. 09/25/16   Little, Jordan Likesraci M, PA-C  traMADol (ULTRAM) 50 MG tablet Take 1 tablet (50 mg total) by mouth every 6 (six) hours as needed for moderate pain. 10/08/16   Joni ReiningSmith, Elfreida Heggs K, PA-C  traZODone (DESYREL) 150 MG tablet Take 1 tablet (150 mg total) by mouth at bedtime as needed for sleep. 07/21/16   Pucilowska, Ellin GoodieJolanta B, MD    Allergies Penicillins; Rocephin [ceftriaxone]; Zithromax [azithromycin]; and Lidocaine  Family History  Problem  Relation Age of Onset  . Diabetes Mother   . Diabetes Maternal Grandmother   . Heart disease Maternal Grandmother   . Cancer Neg Hx   . Ovarian cancer Neg Hx     Social History Social History  Substance Use Topics  . Smoking status: Current Every Day Smoker    Types: Cigarettes    Last attempt to quit: 08/13/2015  . Smokeless tobacco: Never Used  . Alcohol use No    Review of Systems Constitutional: No fever/chills Eyes: No visual changes. ENT: No sore throat. Cardiovascular: Denies chest pain. Respiratory: Denies shortness of breath. Gastrointestinal: No abdominal pain.  No nausea, no vomiting.  No diarrhea.  No constipation. Genitourinary: Negative for dysuria. Musculoskeletal: Right shoulder pain  Skin: Negative for rash. Neurological: Negative for headaches, focal weakness or numbness. Psychiatric: depression and PTSD. History of cannabis dependency  Allergic/Immunilogical:See medication list______________________________________   PHYSICAL EXAM:  VITAL SIGNS: ED Triage Vitals  Enc Vitals Group     BP 10/08/16 0805 131/63     Pulse Rate 10/08/16 0805 88     Resp 10/08/16 0805 18     Temp 10/08/16 0805 98.6 F (37 C)     Temp Source 10/08/16 0805 Oral     SpO2 10/08/16 0805 99 %     Weight 10/08/16 0756 185 lb (83.9 kg)     Height 10/08/16 0756 5\' 4"  (1.626 m)     Head Circumference --      Peak Flow --      Pain Score 10/08/16 0756 8     Pain Loc --      Pain Edu? --      Excl. in GC? --     Constitutional: Alert and oriented. Well appearing and in no acute distress. Cardiovascular: Normal rate, regular rhythm. Grossly normal heart sounds.  Good peripheral circulation. Respiratory: Normal respiratory effort.  No retractions. Lungs CTAB. Gastrointestinal: Soft and nontender. No distention. No abdominal bruits. No CVA tenderness. Musculoskeletal: No obvious deformity, edema, or ecchymosis to the right shoulder. Patient has moderate guarding with palpation  the GH joint and the humeral head. Decreased range of motion with abduction and overhead reaching. Neurologic:  Normal speech and language. No gross focal neurologic deficits are appreciated. No gait instability. Skin:  Skin is warm, dry and intact. No rash noted. Psychiatric: Mood and affect are normal. Speech and behavior are normal.  ____________________________________________   LABS (all labs ordered are listed, but only abnormal results are displayed)  Labs Reviewed - No data to display ____________________________________________  EKG   ____________________________________________  RADIOLOGY  Dg Shoulder Right  Result Date: 10/08/2016 CLINICAL DATA:  Tripped and fell 1 hour ago with pain in the right shoulder, history of prior shoulder dislocation EXAM: RIGHT SHOULDER - 2+ VIEW COMPARISON:  Right shoulder films of 07/08/2015 FINDINGS: The right humeral head is in normal position. The glenohumeral joint space appears normal. The right AC joint is normally aligned. The ribs that are visualized are intact. IMPRESSION: Negative. Electronically Signed   By: Lucienne MinksPaul  Barry M.D.  On: 10/08/2016 08:46    __No acute findings x-ray of the right shoulder. __________________________________________   PROCEDURES  Procedure(s) performed: None  Procedures  Critical Care performed: No  ____________________________________________   INITIAL IMPRESSION / ASSESSMENT AND PLAN / ED COURSE  Pertinent labs & imaging results that were available during my care of the patient were reviewed by me and considered in my medical decision making (see chart for details).  Pain secondary to contusion right shoulder. Discussed neck x-ray finding with patient. Patient given discharge care instruction work no. Patient passed follow-up with PCP if condition persists.      ____________________________________________   FINAL CLINICAL IMPRESSION(S) / ED DIAGNOSES  Final diagnoses:  Contusion of  right shoulder, initial encounter      NEW MEDICATIONS STARTED DURING THIS VISIT:  New Prescriptions   NAPROXEN (NAPROSYN) 500 MG TABLET    Take 1 tablet (500 mg total) by mouth 2 (two) times daily with a meal.   TRAMADOL (ULTRAM) 50 MG TABLET    Take 1 tablet (50 mg total) by mouth every 6 (six) hours as needed for moderate pain.     Note:  This document was prepared using Dragon voice recognition software and may include unintentional dictation errors.    Joni Reining, PA-C 10/08/16 1610    Merrily Brittle, MD 10/08/16 0930

## 2016-10-08 NOTE — ED Triage Notes (Signed)
Pt states she tripped and fell about an hour PTA and injured her right shoulder. Pt is holding infant child without difficulty..Marland Kitchen

## 2016-10-11 ENCOUNTER — Encounter: Payer: Self-pay | Admitting: Emergency Medicine

## 2016-10-11 ENCOUNTER — Emergency Department
Admission: EM | Admit: 2016-10-11 | Discharge: 2016-10-11 | Disposition: A | Discharge status: Home / Self Care | Payer: Medicaid Other | Attending: Emergency Medicine | Admitting: Emergency Medicine

## 2016-10-11 DIAGNOSIS — M25511 Pain in right shoulder: Secondary | ICD-10-CM | POA: Insufficient documentation

## 2016-10-11 DIAGNOSIS — J45909 Unspecified asthma, uncomplicated: Secondary | ICD-10-CM | POA: Diagnosis not present

## 2016-10-11 DIAGNOSIS — G8929 Other chronic pain: Secondary | ICD-10-CM | POA: Insufficient documentation

## 2016-10-11 DIAGNOSIS — J069 Acute upper respiratory infection, unspecified: Secondary | ICD-10-CM | POA: Diagnosis not present

## 2016-10-11 DIAGNOSIS — J029 Acute pharyngitis, unspecified: Secondary | ICD-10-CM | POA: Diagnosis present

## 2016-10-11 DIAGNOSIS — Z79899 Other long term (current) drug therapy: Secondary | ICD-10-CM | POA: Diagnosis not present

## 2016-10-11 DIAGNOSIS — F17210 Nicotine dependence, cigarettes, uncomplicated: Secondary | ICD-10-CM | POA: Insufficient documentation

## 2016-10-11 LAB — POCT RAPID STREP A: STREPTOCOCCUS, GROUP A SCREEN (DIRECT): NEGATIVE

## 2016-10-11 MED ORDER — PSEUDOEPH-BROMPHEN-DM 30-2-10 MG/5ML PO SYRP: 5.0000 mL | ORAL_SOLUTION | Freq: Four times a day (QID) | ORAL | 0 refills | Status: DC | PRN

## 2016-10-11 NOTE — ED Triage Notes (Signed)
Pt here and reports was exposed to the flu two days ago. C/o sore throat and body aches.  Also reports hurt shoulder recently and went back to work yesterday and now it hurts worse.  Would like to be seen for shoulder and the flu. Ambulatory to triage without difficulty. NAD. VSS

## 2016-10-11 NOTE — Discharge Instructions (Signed)
[facility] if any continued problems with your  shoulder. Continue taking naproxen as directed with food. Begin taking Bromfed DM as needed for cough and congestion. Increase fluids. Tylenol or ibuprofen as needed for throat pain or body aches.

## 2016-10-11 NOTE — ED Provider Notes (Signed)
[facility] Emergency Department Provider Note  ____________________________________________   First MD Initiated Contact with Patient 10/11/16 [phone]     (approximate)  I have reviewed the triage vital signs and the nursing notes.   HISTORY  Chief Complaint Influenza   HPI Kristy Reid is a 21 y.o. female is here with complaint of sore throat and body aches. Patient states that she believes she was exposed to the flu. Patient has not been taking any over-the-counter medication for her symptoms. She also continues to complain about her shoulder pain. She states that she was seen earlier in the week for shoulder pain and went back to work yesterday which has increased her pain now. She was seen on 10/08/16 at which time her x-rays were negative. Patient was given a prescription for naproxen and tramadol. Currently she rates her pain as an 8 out of 10.   Past Medical History:  Diagnosis Date  . Anemia   . Anemia   . Anxiety   . Asthma   . Chronic back pain   . Depression   . Insomnia   . Personality disorder    "boarderline"  . PTSD (post-traumatic stress disorder)     Patient Active Problem List   Diagnosis Date Noted  . Severe recurrent major depression with psychotic features (HCC) 07/18/2016  . Depression, major, recurrent, severe with psychosis (HCC) 07/17/2016  . Normal labor 05/07/2016  . Labor and delivery, indication for care 04/19/2016  . Indication for care in labor and delivery, antepartum 04/06/2016  . Decreased fetal movement 03/22/2016  . Abdominal pain in pregnancy 01/21/2016  . Constipation during pregnancy in second trimester 12/26/2015  . Overdose 11/29/2015  . Pregnancy (I trimester) 11/15/2015  . Cannabis use disorder, severe, dependence (HCC) 11/15/2015  . Tobacco use disorder 11/15/2015  . Asthma 11/15/2015  . Borderline personality disorder 11/15/2015  . PTSD (post-traumatic stress disorder) 11/15/2015  . Severe  recurrent major depression without psychotic features (HCC) 11/13/2015    Past Surgical History:  Procedure Laterality Date  . BUNIONECTOMY Bilateral    feet  . wrist sugery      Prior to Admission medications   Medication Sig Start Date End Date Taking? Authorizing Provider  albuterol (PROVENTIL HFA;VENTOLIN HFA) 108 (90 Base) MCG/ACT inhaler Inhale 1-2 puffs into the lungs every 6 (six) hours as needed for wheezing or shortness of breath. 07/21/16   Pucilowska, [name], MD  brompheniramine-pseudoephedrine-DM 30-2-10 MG/5ML syrup Take 5 mLs by mouth 4 (four) times daily as needed. 10/11/16   Tommi RumpsSummers, Deneene Tarver L, PA-C  cyclobenzaprine (FLEXERIL) 5 MG tablet take 1 tablet by mouth twice a day if needed 08/04/16   Tresea MallGledhill, Jane, CNM  hydrOXYzine (ATARAX/VISTARIL) 50 MG tablet Take 1 tablet (50 mg total) by mouth 3 (three) times daily as needed for anxiety. 07/21/16   Pucilowska, Braulio ConteJolanta B, MD  loratadine (CLARITIN) 10 MG tablet Take 1 tablet (10 mg total) by mouth daily. 07/22/16   Pucilowska, Ellin GoodieJolanta B, MD  naproxen (NAPROSYN) 500 MG tablet Take 1 tablet (500 mg total) by mouth 2 (two) times daily with a meal. 10/08/16   Joni ReiningSmith, Ronald K, PA-C  ondansetron (ZOFRAN ODT) 4 MG disintegrating tablet Take 1 tablet (4 mg total) by mouth every 8 (eight) hours as needed for nausea or vomiting. 08/02/16   Willy Eddyobinson, Patrick, MD  QUEtiapine (SEROQUEL) 300 MG tablet Take 1 tablet (300 mg total) by mouth at bedtime. 07/21/16   Pucilowska, Ellin GoodieJolanta B, MD  sertraline (ZOLOFT)  100 MG tablet Take 1 tablet (100 mg total) by mouth daily. 07/22/16   Pucilowska, Ellin GoodieJolanta B, MD  traMADol (ULTRAM) 50 MG tablet Take 1 tablet (50 mg total) by mouth every 6 (six) hours as needed for moderate pain. 10/08/16   Joni ReiningSmith, Ronald K, PA-C  traZODone (DESYREL) 150 MG tablet Take 1 tablet (150 mg total) by mouth at bedtime as needed for sleep. 07/21/16   Pucilowska, Ellin GoodieJolanta B, MD    Allergies Penicillins; Rocephin [ceftriaxone]; Zithromax  [azithromycin]; and Lidocaine  Family History  Problem Relation Age of Onset  . Diabetes Mother   . Diabetes Maternal Grandmother   . Heart disease Maternal Grandmother   . Cancer Neg Hx   . Ovarian cancer Neg Hx     Social History Social History  Substance Use Topics  . Smoking status: Current Every Day Smoker    Types: Cigarettes    Last attempt to quit: 08/13/2015  . Smokeless tobacco: Never Used  . Alcohol use No    Review of Systems Constitutional: No fever/chills ENT: Positive sore throat. Cardiovascular: Denies chest pain. Respiratory: Denies shortness of breath. Gastrointestinal: No abdominal pain.  No nausea, no vomiting.  No diarrhea.   Genitourinary: Negative for dysuria. Musculoskeletal: Positive for right shoulder pain. Skin: Negative for rash. Neurological: Negative for headaches, focal weakness or numbness.  ___________________________________________   PHYSICAL EXAM:  VITAL SIGNS: ED Triage Vitals  Enc Vitals Group     BP --      Pulse Rate 10/11/16 0850 97     Resp 10/11/16 0850 18     Temp 10/11/16 0850 98.4 F (36.9 C)     Temp Source 10/11/16 0850 Oral     SpO2 10/11/16 0850 100 %     Weight 10/11/16 0850 185 lb (83.9 kg)     Height 10/11/16 0850 5\' 2"  (1.575 m)     Head Circumference --      Peak Flow --      Pain Score 10/11/16 0849 8     Pain Loc --      Pain Edu? --      Excl. in GC? --    Constitutional: Alert and oriented. Well appearing and in no acute distress. Eyes: Conjunctivae are normal.  Head: Atraumatic. Nose: No congestion/rhinnorhea.  EACs and TMs are clear bilaterally. Mouth/Throat: Mucous membranes are moist.  Oropharynx non-erythematous. Mild posterior drainage noted. Neck: No stridor.  Hematological/Lymphatic/Immunilogical: No cervical lymphadenopathy. Cardiovascular: Normal rate, regular rhythm. Grossly normal heart sounds.  Good peripheral circulation. Respiratory: Normal respiratory effort.  No retractions.  Lungs CTAB. Gastrointestinal: Soft and nontender. No distention.  Musculoskeletal: No gross deformity is noted as right shoulder. No crepitus is noted with range of motion. Range of motion is slow and guarded. No soft tissue swelling is present. Neurologic:  Normal speech and language. No gross focal neurologic deficits are appreciated. No gait instability. Skin:  Skin is warm, dry and intact. No rash noted. Psychiatric: Mood and affect are normal. Speech and behavior are normal.  ____________________________________________   LABS (all labs ordered are listed, but only abnormal results are displayed)  Labs Reviewed  POCT RAPID STREP A    PROCEDURES  Procedure(s) performed: None  Procedures  Critical Care performed: No  ____________________________________________   INITIAL IMPRESSION / ASSESSMENT AND PLAN / ED COURSE  Pertinent labs & imaging results that were available during my care of the patient were reviewed by me and considered in my medical decision making (see chart for details).  Patient is continue taking her medication as prescribed for her right shoulder and follow-up with her PCP at [facility] any continued problems. She is also given a prescription for Bromfed DM as needed for cough and congestion. She is encouraged to increase fluids. She also may take Tylenol if needed for throat pain. Salt water gargle was encouraged.    ___________________________________________   FINAL CLINICAL IMPRESSION(S) / ED DIAGNOSES  Final diagnoses:  URI with cough and congestion  Acute pain of right shoulder      NEW MEDICATIONS STARTED DURING THIS VISIT:  Discharge Medication List as of 10/11/2016 10:33 AM    START taking these medications   Details  brompheniramine-pseudoephedrine-DM 30-2-10 MG/5ML syrup Take 5 mLs by mouth 4 (four) times daily as needed., Starting Sun 10/11/2016, Print         Note:  This document was prepared using Dragon  voice recognition software and may include unintentional dictation errors.    Tommi Rumps, PA-C 10/11/16 1142    Jeanmarie Plant, MD 10/11/16 [phone]

## 2016-10-23 ENCOUNTER — Encounter: Payer: Self-pay | Admitting: Emergency Medicine

## 2016-10-23 ENCOUNTER — Emergency Department
Admission: EM | Admit: 2016-10-23 | Discharge: 2016-10-23 | Disposition: A | Discharge status: Home / Self Care | Payer: Medicaid Other | Attending: Emergency Medicine | Admitting: Emergency Medicine

## 2016-10-23 ENCOUNTER — Emergency Department: Payer: Medicaid Other

## 2016-10-23 DIAGNOSIS — R112 Nausea with vomiting, unspecified: Secondary | ICD-10-CM | POA: Insufficient documentation

## 2016-10-23 DIAGNOSIS — J45909 Unspecified asthma, uncomplicated: Secondary | ICD-10-CM | POA: Insufficient documentation

## 2016-10-23 DIAGNOSIS — R109 Unspecified abdominal pain: Secondary | ICD-10-CM

## 2016-10-23 DIAGNOSIS — R102 Pelvic and perineal pain: Secondary | ICD-10-CM | POA: Insufficient documentation

## 2016-10-23 DIAGNOSIS — Z79899 Other long term (current) drug therapy: Secondary | ICD-10-CM | POA: Diagnosis not present

## 2016-10-23 DIAGNOSIS — G8929 Other chronic pain: Secondary | ICD-10-CM | POA: Insufficient documentation

## 2016-10-23 DIAGNOSIS — Z791 Long term (current) use of non-steroidal anti-inflammatories (NSAID): Secondary | ICD-10-CM | POA: Insufficient documentation

## 2016-10-23 DIAGNOSIS — Z87891 Personal history of nicotine dependence: Secondary | ICD-10-CM | POA: Insufficient documentation

## 2016-10-23 DIAGNOSIS — R079 Chest pain, unspecified: Secondary | ICD-10-CM | POA: Diagnosis not present

## 2016-10-23 LAB — URINALYSIS, COMPLETE (UACMP) WITH MICROSCOPIC
BILIRUBIN URINE: NEGATIVE
Bacteria, UA: NONE SEEN
GLUCOSE, UA: NEGATIVE mg/dL
HGB URINE DIPSTICK: NEGATIVE
Ketones, ur: NEGATIVE mg/dL
LEUKOCYTES UA: NEGATIVE
NITRITE: NEGATIVE
PH: 6 (ref 5.0–8.0)
Protein, ur: NEGATIVE mg/dL
Specific Gravity, Urine: 1.019 (ref 1.005–1.030)

## 2016-10-23 LAB — CBC WITH DIFFERENTIAL/PLATELET
BASOS ABS: 0.1 10*3/uL (ref 0–0.1)
BASOS PCT: 1 %
Eosinophils Absolute: 0.3 10*3/uL (ref 0–0.7)
Eosinophils Relative: 4 %
HCT: 39.2 % (ref 35.0–47.0)
HEMOGLOBIN: 12.5 g/dL (ref 12.0–16.0)
LYMPHS PCT: 55 %
Lymphs Abs: 4.2 10*3/uL — ABNORMAL HIGH (ref 1.0–3.6)
MCH: 24.4 pg — ABNORMAL LOW (ref 26.0–34.0)
MCHC: 32 g/dL (ref 32.0–36.0)
MCV: 76.3 fL — ABNORMAL LOW (ref 80.0–100.0)
Monocytes Absolute: 0.6 10*3/uL (ref 0.2–0.9)
Monocytes Relative: 8 %
NEUTROS ABS: 2.4 10*3/uL (ref 1.4–6.5)
NEUTROS PCT: 32 %
PLATELETS: 333 10*3/uL (ref 150–440)
RBC: 5.14 MIL/uL (ref 3.80–5.20)
RDW: 18.4 % — AB (ref 11.5–14.5)
WBC: 7.6 10*3/uL (ref 3.6–11.0)

## 2016-10-23 LAB — COMPREHENSIVE METABOLIC PANEL
ALBUMIN: 4.1 g/dL (ref 3.5–5.0)
ALT: 31 U/L (ref 14–54)
AST: 25 U/L (ref 15–41)
Alkaline Phosphatase: 87 U/L (ref 38–126)
Anion gap: 8 (ref 5–15)
BUN: 13 mg/dL (ref 6–20)
CHLORIDE: 109 mmol/L (ref 101–111)
CO2: 21 mmol/L — ABNORMAL LOW (ref 22–32)
Calcium: 9.8 mg/dL (ref 8.9–10.3)
Creatinine, Ser: 0.94 mg/dL (ref 0.44–1.00)
GFR calc Af Amer: >60 mL/min (ref >60)
GFR calc non Af Amer: >60 mL/min (ref >60)
Glucose, Bld: 116 mg/dL — ABNORMAL HIGH (ref 65–99)
POTASSIUM: 4.2 mmol/L (ref 3.5–5.1)
Sodium: 138 mmol/L (ref 135–145)
Total Bilirubin: 0.4 mg/dL (ref 0.3–1.2)
Total Protein: 8.3 g/dL — ABNORMAL HIGH (ref 6.5–8.1)

## 2016-10-23 LAB — HCG, QUANTITATIVE, PREGNANCY

## 2016-10-23 LAB — FIBRIN DERIVATIVES D-DIMER (ARMC ONLY): Fibrin derivatives D-dimer (ARMC): 269.33 (ref 0.00–499.00)

## 2016-10-23 LAB — POCT PREGNANCY, URINE: Preg Test, Ur: NEGATIVE

## 2016-10-23 LAB — LIPASE, BLOOD: LIPASE: 38 U/L (ref 11–51)

## 2016-10-23 LAB — TROPONIN I

## 2016-10-23 MED ORDER — ONDANSETRON HCL 4 MG PO TABS: 4.0000 mg | ORAL_TABLET | Freq: Every day | ORAL | 0 refills | Status: DC | PRN

## 2016-10-23 MED ORDER — ONDANSETRON HCL 4 MG PO TABS
4.0000 mg | ORAL_TABLET | Freq: Once | ORAL | Status: AC
  Administered 2016-10-23: 4 mg via ORAL
  Filled 2016-10-23: qty 1

## 2016-10-23 NOTE — ED Notes (Signed)
Patient c/o central chest pain rated at 8 out of 10 and described as sharp/stabbing beginning last night that woke her from sleep.  Pt reports associated symptoms of weakness, lightheadedness, nausea, vomiting and diaphoresis.  Pt denies SOB.  Pt reports smoking hx and significant family history.    Pt c/o fatigue and generalized malaise.

## 2016-10-23 NOTE — ED Provider Notes (Addendum)
[facility] Emergency Department Provider Note  ____________________________________________   First MD Initiated Contact with Patient 10/23/16 1912     (approximate)  I have reviewed the triage vital signs and the nursing notes.   HISTORY  Chief Complaint Chest Pain   HPI Kristy Reid is a 21 y.o. female with a history of borderline personality disorder as well as anemia and anxiety who is presenting to the emergency department today with 1 day of chest pain. Says the pain is a 9 out of 10 cramping pain to the left side of her chest. She does not report any radiation or shortness of breath. She says that she has had nausea and vomiting throughout the day without any blood in her vomitus. Does not report any diarrhea. Also with suprapubic abdominal pain. However, without any urinary frequency or pain with urination. Denies any vaginal bleeding or discharge. She denies any injury to the chest. Does not report any worsening pain with movement of the left upper extremity. No history of pulmonary emboli. Patient does report cough over the past week but without any sputum production. Also with pain to the right nostril and the patient is also been reporting frequent nosebleeds which she says she has had when she has been pregnant. She says that although her urine pregnancy was negative today that in the past she has had a positive blood pregnancy test with a negative urine pregnancy. She says the chest pain has slowly increased since yesterday evening.Patient also concerned because of lactation from the breasts bilaterally which she says occurred the last time she was pregnant. Has that she was first reported her OB/GYN this morning for a blood pregnancy test was unable to make the appointment.   Past Medical History:  Diagnosis Date  . Anemia   . Anemia   . Anxiety   . Asthma   . Chronic back pain   . Depression   . Insomnia   . Personality disorder    "boarderline"  . PTSD (post-traumatic stress disorder)     Patient Active Problem List   Diagnosis Date Noted  . Severe recurrent major depression with psychotic features (HCC) 07/18/2016  . Depression, major, recurrent, severe with psychosis (HCC) 07/17/2016  . Normal labor 05/07/2016  . Labor and delivery, indication for care 04/19/2016  . Indication for care in labor and delivery, antepartum 04/06/2016  . Decreased fetal movement 03/22/2016  . Abdominal pain in pregnancy 01/21/2016  . Constipation during pregnancy in second trimester 12/26/2015  . Overdose 11/29/2015  . Pregnancy (I trimester) 11/15/2015  . Cannabis use disorder, severe, dependence (HCC) 11/15/2015  . Tobacco use disorder 11/15/2015  . Asthma 11/15/2015  . Borderline personality disorder 11/15/2015  . PTSD (post-traumatic stress disorder) 11/15/2015  . Severe recurrent major depression without psychotic features (HCC) 11/13/2015    Past Surgical History:  Procedure Laterality Date  . BUNIONECTOMY Bilateral    feet  . wrist sugery      Prior to Admission medications   Medication Sig Start Date End Date Taking? Authorizing Provider  albuterol (PROVENTIL HFA;VENTOLIN HFA) 108 (90 Base) MCG/ACT inhaler Inhale 1-2 puffs into the lungs every 6 (six) hours as needed for wheezing or shortness of breath. 07/21/16   Pucilowska, [name], MD  brompheniramine-pseudoephedrine-DM 30-2-10 MG/5ML syrup Take 5 mLs by mouth 4 (four) times daily as needed. 10/11/16   Tommi Rumps, PA-C  cyclobenzaprine (FLEXERIL) 5 MG tablet take 1 tablet by mouth twice a day if needed  08/04/16   Tresea Mall, CNM  hydrOXYzine (ATARAX/VISTARIL) 50 MG tablet Take 1 tablet (50 mg total) by mouth 3 (three) times daily as needed for anxiety. 07/21/16   Pucilowska, Braulio Conte B, MD  loratadine (CLARITIN) 10 MG tablet Take 1 tablet (10 mg total) by mouth daily. 07/22/16   Pucilowska, Ellin Goodie, MD  naproxen (NAPROSYN) 500 MG tablet Take 1 tablet (500 mg  total) by mouth 2 (two) times daily with a meal. 10/08/16   Joni Reining, PA-C  ondansetron (ZOFRAN ODT) 4 MG disintegrating tablet Take 1 tablet (4 mg total) by mouth every 8 (eight) hours as needed for nausea or vomiting. 08/02/16   Willy Eddy, MD  QUEtiapine (SEROQUEL) 300 MG tablet Take 1 tablet (300 mg total) by mouth at bedtime. 07/21/16   Pucilowska, Braulio Conte B, MD  sertraline (ZOLOFT) 100 MG tablet Take 1 tablet (100 mg total) by mouth daily. 07/22/16   Pucilowska, Ellin Goodie, MD  traMADol (ULTRAM) 50 MG tablet Take 1 tablet (50 mg total) by mouth every 6 (six) hours as needed for moderate pain. 10/08/16   Joni Reining, PA-C  traZODone (DESYREL) 150 MG tablet Take 1 tablet (150 mg total) by mouth at bedtime as needed for sleep. 07/21/16   Pucilowska, Ellin Goodie, MD    Allergies Penicillins; Rocephin [ceftriaxone]; Zithromax [azithromycin]; and Lidocaine  Family History  Problem Relation Age of Onset  . Diabetes Mother   . Diabetes Maternal Grandmother   . Heart disease Maternal Grandmother   . Cancer Neg Hx   . Ovarian cancer Neg Hx     Social History Social History  Substance Use Topics  . Smoking status: Former Smoker    Types: Cigarettes    Quit date: 08/13/2015  . Smokeless tobacco: Never Used  . Alcohol use No    Review of Systems  Constitutional: No fever/chills Eyes: No visual changes. ENT: No sore throat. Cardiovascular: as above Respiratory: Denies shortness of breath. Gastrointestinal:  No nausea, no vomiting.  No diarrhea.  No constipation. Genitourinary: Negative for dysuria. Musculoskeletal: Negative for back pain. Skin: Negative for rash. Neurological: Negative for headaches, focal weakness or numbness.   ____________________________________________   PHYSICAL EXAM:  VITAL SIGNS: ED Triage Vitals  Enc Vitals Group     BP 10/23/16 1551 117/67     Pulse Rate 10/23/16 1551 (!) 114     Resp 10/23/16 1551 16     Temp 10/23/16 1548 98.5 F (36.9  C)     Temp Source 10/23/16 1548 Oral     SpO2 10/23/16 1551 99 %     Weight 10/23/16 1549 185 lb (83.9 kg)     Height 10/23/16 1549 5\' 2"  (1.575 m)     Head Circumference --      Peak Flow --      Pain Score 10/23/16 1921 8     Pain Loc --      Pain Edu? --      Excl. in GC? --     Constitutional: Alert and oriented. Well appearing and in no acute distress. Eyes: Conjunctivae are normal.  Head: Atraumatic. Nose: No congestion/rhinnorhea. Mouth/Throat: Mucous membranes are moist.  Neck: No stridor.   Cardiovascular: Normal rate, regular rhythm. Grossly normal heart sounds.  Reproducible tenderness to palpation over the sternum as well as the left side of the chest. Respiratory: Normal respiratory effort.  No retractions. Lungs CTAB. Gastrointestinal: Soft With mild tenderness to palpation across the lower abdomen. No focal tenderness to the  right lower quadrant. There is no rebound or guarding. No distention.  Musculoskeletal: No lower extremity tenderness nor edema.  No joint effusions. Neurologic:  Normal speech and language. No gross focal neurologic deficits are appreciated. Skin:  Skin is warm, dry and intact. No rash noted. Psychiatric: Mood and affect are normal. Speech and behavior are normal.  ____________________________________________   LABS (all labs ordered are listed, but only abnormal results are displayed)  Labs Reviewed  CBC WITH DIFFERENTIAL/PLATELET - Abnormal; Notable for the following:       Result Value   MCV 76.3 (*)    MCH 24.4 (*)    RDW 18.4 (*)    Lymphs Abs 4.2 (*)    All other components within normal limits  COMPREHENSIVE METABOLIC PANEL - Abnormal; Notable for the following:    CO2 21 (*)    Glucose, Bld 116 (*)    Total Protein 8.3 (*)    All other components within normal limits  URINALYSIS, COMPLETE (UACMP) WITH MICROSCOPIC - Abnormal; Notable for the following:    Color, Urine YELLOW (*)    APPearance CLEAR (*)    Squamous  Epithelial / LPF 6-30 (*)    All other components within normal limits  TROPONIN I  HCG, QUANTITATIVE, PREGNANCY  FIBRIN DERIVATIVES D-DIMER ([facility] ONLY)  LIPASE, BLOOD  POC URINE PREG, ED  POCT PREGNANCY, URINE   ____________________________________________  EKG  ED ECG REPORT I, Fergie Sherbert,  Teena Iraniavid M, the attending physician, personally viewed and interpreted this ECG.   Date: 10/23/2016  EKG Time: 1557  Rate: 106  Rhythm: sinus tachycardia  Axis: Normal  Intervals:none  ST&T Change: No ST segment elevation or depression. No abnormal T-wave inversion.  ____________________________________________  RADIOLOGY  No active cardiopulmonary disease on the chest x-ray. ____________________________________________   PROCEDURES  Procedure(s) performed:   Procedures  Critical Care performed:   ____________________________________________   INITIAL IMPRESSION / ASSESSMENT AND PLAN / ED COURSE  Pertinent labs & imaging results that were available during my care of the patient were reviewed by me and considered in my medical decision making (see chart for details).  ----------------------------------------- 10:37 PM on 10/23/2016 -----------------------------------------  Patient still saying that she is in pain. However, I reexamined her abdomen and she is nontender. About 24 hours of symptoms with a reassuring EKG as well as troponin. Negative d-dimer. Patient aware of lab results. Very unlikely to have emergent pathology at this time but will [facility]. She is understanding the plan and willing to comply. White blood cell count. Very likely to be appendicitis. Patient continues to deny any vaginal discharge or bleeding and says that she is not concerned about STDs. We'll defer pelvic exam for her OB/GYN.      ____________________________________________   FINAL CLINICAL IMPRESSION(S) / ED DIAGNOSES  Chest pain. Abdominal pain.    NEW  MEDICATIONS STARTED DURING THIS VISIT:  New Prescriptions   No medications on file     Note:  This document was prepared using Dragon voice recognition software and may include unintentional dictation errors.     Myrna BlazerSchaevitz, Tinisha Etzkorn Matthew, MD 10/23/16 2238  Patient also no longer nauseous. No vomiting in the emergency department.    Myrna BlazerSchaevitz, Rudra Hobbins Matthew, MD 10/23/16 2239

## 2016-10-23 NOTE — ED Triage Notes (Addendum)
C/O chest pains x 1 day.  Also c/o generalized body aches.  Productive cough.  Patient states she had been on Depo, last shot in February.  Patient has not had a menstrual cycle since June.  Patient's states milk has "come back" x a few weeks.  Patient delivered last child February 2018, and never breast fed.  Patient had appointment this morning with OB/GYN this morning, but could not make the appointment.

## 2016-10-23 NOTE — ED Notes (Signed)
Patient transported to X-ray 

## 2016-10-23 NOTE — ED Notes (Signed)
Reviewed d/c instructions, follow-up care, prescription with patient. Pt verbalized understanding.  

## 2016-11-11 ENCOUNTER — Encounter: Payer: Self-pay | Admitting: Emergency Medicine

## 2016-11-11 ENCOUNTER — Emergency Department
Admission: EM | Admit: 2016-11-11 | Discharge: 2016-11-11 | Disposition: A | Discharge status: Home / Self Care | Payer: Medicaid Other | Attending: Emergency Medicine | Admitting: Emergency Medicine

## 2016-11-11 DIAGNOSIS — Z79899 Other long term (current) drug therapy: Secondary | ICD-10-CM | POA: Diagnosis not present

## 2016-11-11 DIAGNOSIS — Z87891 Personal history of nicotine dependence: Secondary | ICD-10-CM | POA: Diagnosis not present

## 2016-11-11 DIAGNOSIS — Z91199 Patient's noncompliance with other medical treatment and regimen due to unspecified reason: Secondary | ICD-10-CM

## 2016-11-11 DIAGNOSIS — J45909 Unspecified asthma, uncomplicated: Secondary | ICD-10-CM | POA: Diagnosis not present

## 2016-11-11 DIAGNOSIS — F319 Bipolar disorder, unspecified: Secondary | ICD-10-CM | POA: Insufficient documentation

## 2016-11-11 DIAGNOSIS — F3181 Bipolar II disorder: Secondary | ICD-10-CM | POA: Diagnosis not present

## 2016-11-11 DIAGNOSIS — F4310 Post-traumatic stress disorder, unspecified: Secondary | ICD-10-CM | POA: Diagnosis present

## 2016-11-11 DIAGNOSIS — F419 Anxiety disorder, unspecified: Secondary | ICD-10-CM | POA: Diagnosis present

## 2016-11-11 DIAGNOSIS — Z9119 Patient's noncompliance with other medical treatment and regimen: Secondary | ICD-10-CM

## 2016-11-11 HISTORY — DX: Bipolar disorder, unspecified: F31.9

## 2016-11-11 LAB — COMPREHENSIVE METABOLIC PANEL
ALBUMIN: 4.1 g/dL (ref 3.5–5.0)
ALK PHOS: 80 U/L (ref 38–126)
ALT: 26 U/L (ref 14–54)
ANION GAP: 10 (ref 5–15)
AST: 27 U/L (ref 15–41)
BILIRUBIN TOTAL: 0.5 mg/dL (ref 0.3–1.2)
BUN: 11 mg/dL (ref 6–20)
CALCIUM: 9.6 mg/dL (ref 8.9–10.3)
CO2: 19 mmol/L — ABNORMAL LOW (ref 22–32)
Chloride: 109 mmol/L (ref 101–111)
Creatinine, Ser: 0.86 mg/dL (ref 0.44–1.00)
GFR calc Af Amer: >60 mL/min (ref >60)
GLUCOSE: 100 mg/dL — AB (ref 65–99)
POTASSIUM: 3.8 mmol/L (ref 3.5–5.1)
Sodium: 138 mmol/L (ref 135–145)
TOTAL PROTEIN: 8.1 g/dL (ref 6.5–8.1)

## 2016-11-11 LAB — CBC
HEMATOCRIT: 40.1 % (ref 35.0–47.0)
Hemoglobin: 12.8 g/dL (ref 12.0–16.0)
MCH: 24.9 pg — ABNORMAL LOW (ref 26.0–34.0)
MCHC: 32 g/dL (ref 32.0–36.0)
MCV: 78 fL — ABNORMAL LOW (ref 80.0–100.0)
PLATELETS: 319 10*3/uL (ref 150–440)
RBC: 5.14 MIL/uL (ref 3.80–5.20)
RDW: 19.2 % — AB (ref 11.5–14.5)
WBC: 7.6 10*3/uL (ref 3.6–11.0)

## 2016-11-11 LAB — URINE DRUG SCREEN, QUALITATIVE (ARMC ONLY)
Amphetamines, Ur Screen: NOT DETECTED
BARBITURATES, UR SCREEN: NOT DETECTED
BENZODIAZEPINE, UR SCRN: NOT DETECTED
Cannabinoid 50 Ng, Ur ~~LOC~~: NOT DETECTED
Cocaine Metabolite,Ur ~~LOC~~: NOT DETECTED
MDMA (Ecstasy)Ur Screen: NOT DETECTED
METHADONE SCREEN, URINE: NOT DETECTED
OPIATE, UR SCREEN: NOT DETECTED
PHENCYCLIDINE (PCP) UR S: NOT DETECTED
Tricyclic, Ur Screen: NOT DETECTED

## 2016-11-11 LAB — ETHANOL

## 2016-11-11 LAB — ACETAMINOPHEN LEVEL

## 2016-11-11 LAB — SALICYLATE LEVEL: Salicylate Lvl: <7 mg/dL (ref 2.8–30.0)

## 2016-11-11 LAB — POCT PREGNANCY, URINE: PREG TEST UR: NEGATIVE

## 2016-11-11 MED ORDER — SERTRALINE HCL 100 MG PO TABS: 100.0000 mg | ORAL_TABLET | Freq: Every day | ORAL | 1 refills | Status: DC

## 2016-11-11 MED ORDER — TRAZODONE HCL 150 MG PO TABS: 150.0000 mg | ORAL_TABLET | Freq: Every evening | ORAL | 1 refills | Status: DC | PRN

## 2016-11-11 MED ORDER — QUETIAPINE FUMARATE 300 MG PO TABS: 300.0000 mg | ORAL_TABLET | Freq: Every day | ORAL | 1 refills | Status: DC

## 2016-11-11 NOTE — ED Provider Notes (Signed)
Dr. Toni Amendlapacs recommends discharge.   Kristy Reid, Kristy Jaquay, MD 11/11/16 [phone]

## 2016-11-11 NOTE — ED Notes (Addendum)
Feliz Beamravis - brother- would like for psych to call him after eval of pt - [phone] or [phone]

## 2016-11-11 NOTE — ED Notes (Addendum)
Pt visited by brother - searched by security and visit observed by this nurse

## 2016-11-11 NOTE — ED Notes (Signed)
Pt dressed herself back into street clothes and all belongings returned to pt

## 2016-11-11 NOTE — ED Notes (Signed)
Psych MD saw pt and stated he would be releasing her and request that she use the phone to call a ride - pt given phone to call for ride

## 2016-11-11 NOTE — ED Notes (Signed)
Pt reports that 2 days ago she had thoughts of wanting to hurt herself and she talked to her social worker and they wanted her to come to the hospital for evaluation of medications - she comes to the ER today for eval of medications but reports that she no longer is SI or HI and that she feels calm and is not having any auditory of visual hallucinations - pt appears in no acute distress and responds appropriately to questions - pt is well groomed
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
## 2016-11-11 NOTE — ED Provider Notes (Addendum)
[facility] Emergency Department Provider Note  ____________________________________________   I have reviewed the triage vital signs and the nursing notes.   HISTORY  Chief Complaint Psychiatric Evaluation    HPI Kristy Reid is a 21 y.o. female Who presents today complaining of being worried about her mental health. Patient has she states sometimes had SI and HI in the past. She may have had something "a little bit" a few days ago which is completely gone. She contracts for safety. However she is feeling quite anxious and she would like to see if her medications are whether supposed to be. She denies any SI or HI she contracts for safety. To me, she denies any chest pain, she states "sometimes" she does have chest pain but hasn't had it in several months. Patient has had 2 negative CT scans of the chest in the last 15 months for atypical chest pain. She states that she has had no fever no chills no nausea no vomiting no abdominal pain she otherwise feels well did not take an overdose and has no other symptoms. She just would like to see a psychiatrist to make sure that her medication is what it should be because she was quite anxious a few days ago.       Past Medical History:  Diagnosis Date  . Anemia   . Anemia   . Anxiety   . Asthma   . Bipolar 1 disorder (HCC)   . Chronic back pain   . Depression   . Insomnia   . Personality disorder    "boarderline"  . PTSD (post-traumatic stress disorder)     Patient Active Problem List   Diagnosis Date Noted  . Severe recurrent major depression with psychotic features (HCC) 07/18/2016  . Depression, major, recurrent, severe with psychosis (HCC) 07/17/2016  . Normal labor 05/07/2016  . Labor and delivery, indication for care 04/19/2016  . Indication for care in labor and delivery, antepartum 04/06/2016  . Decreased fetal movement 03/22/2016  . Abdominal pain in pregnancy 01/21/2016  . Constipation during  pregnancy in second trimester 12/26/2015  . Overdose 11/29/2015  . Pregnancy (I trimester) 11/15/2015  . Cannabis use disorder, severe, dependence (HCC) 11/15/2015  . Tobacco use disorder 11/15/2015  . Asthma 11/15/2015  . Borderline personality disorder 11/15/2015  . PTSD (post-traumatic stress disorder) 11/15/2015  . Severe recurrent major depression without psychotic features (HCC) 11/13/2015    Past Surgical History:  Procedure Laterality Date  . BUNIONECTOMY Bilateral    feet  . wrist sugery      Prior to Admission medications   Medication Sig Start Date End Date Taking? Authorizing Provider  albuterol (PROVENTIL HFA;VENTOLIN HFA) 108 (90 Base) MCG/ACT inhaler Inhale 1-2 puffs into the lungs every 6 (six) hours as needed for wheezing or shortness of breath. 07/21/16   Pucilowska, [name], MD  brompheniramine-pseudoephedrine-DM 30-2-10 MG/5ML syrup Take 5 mLs by mouth 4 (four) times daily as needed. 10/11/16   Tommi Rumps, PA-C  cyclobenzaprine (FLEXERIL) 5 MG tablet take 1 tablet by mouth twice a day if needed 08/04/16   Tresea Mall, CNM  hydrOXYzine (ATARAX/VISTARIL) 50 MG tablet Take 1 tablet (50 mg total) by mouth 3 (three) times daily as needed for anxiety. 07/21/16   Pucilowska, Braulio Conte B, MD  loratadine (CLARITIN) 10 MG tablet Take 1 tablet (10 mg total) by mouth daily. 07/22/16   Pucilowska, [name], MD  naproxen (NAPROSYN) 500 MG tablet Take 1 tablet (500 mg total) by  mouth 2 (two) times daily with a meal. 10/08/16   Joni ReiningSmith, Ronald K, PA-C  ondansetron (ZOFRAN ODT) 4 MG disintegrating tablet Take 1 tablet (4 mg total) by mouth every 8 (eight) hours as needed for nausea or vomiting. 08/02/16   Willy Eddyobinson, Patrick, MD  ondansetron (ZOFRAN) 4 MG tablet Take 1 tablet (4 mg total) by mouth daily as needed. 10/23/16   Myrna BlazerSchaevitz, David Matthew, MD  QUEtiapine (SEROQUEL) 300 MG tablet Take 1 tablet (300 mg total) by mouth at bedtime. 07/21/16   Pucilowska, Braulio ConteJolanta B, MD  sertraline  (ZOLOFT) 100 MG tablet Take 1 tablet (100 mg total) by mouth daily. 07/22/16   Pucilowska, Ellin GoodieJolanta B, MD  traMADol (ULTRAM) 50 MG tablet Take 1 tablet (50 mg total) by mouth every 6 (six) hours as needed for moderate pain. 10/08/16   Joni ReiningSmith, Ronald K, PA-C  traZODone (DESYREL) 150 MG tablet Take 1 tablet (150 mg total) by mouth at bedtime as needed for sleep. 07/21/16   Pucilowska, Ellin GoodieJolanta B, MD    Allergies Penicillins; Rocephin [ceftriaxone]; Zithromax [azithromycin]; and Lidocaine  Family History  Problem Relation Age of Onset  . Diabetes Mother   . Diabetes Maternal Grandmother   . Heart disease Maternal Grandmother   . Cancer Neg Hx   . Ovarian cancer Neg Hx     Social History Social History  Substance Use Topics  . Smoking status: Former Smoker    Types: Cigarettes    Quit date: 08/13/2015  . Smokeless tobacco: Never Used  . Alcohol use No    Review of Systems Constitutional: No fever/chills Eyes: No visual changes. ENT: No sore throat. No stiff neck no neck pain Cardiovascular: Denies chest pain. Respiratory: Denies shortness of breath. Gastrointestinal:   no vomiting.  No diarrhea.  No constipation. Genitourinary: Negative for dysuria. Musculoskeletal: Negative lower extremity swelling Skin: Negative for rash. Neurological: Negative for severe headaches, focal weakness or numbness.   ____________________________________________   PHYSICAL EXAM:  VITAL SIGNS: ED Triage Vitals  Enc Vitals Group     BP 11/11/16 1136 (!) 130/49     Pulse Rate 11/11/16 1136 (!) 120     Resp 11/11/16 1136 18     Temp 11/11/16 1136 99.1 F (37.3 C)     Temp Source 11/11/16 1136 Oral     SpO2 11/11/16 1136 99 %     Weight 11/11/16 1137 185 lb (83.9 kg)     Height 11/11/16 1137 5\' 2"  (1.575 m)     Head Circumference --      Peak Flow --      Pain Score 11/11/16 1136 0     Pain Loc --      Pain Edu? --      Excl. in GC? --     Constitutional: Alert and oriented. Well appearing  and in no acute distress. Eyes: Conjunctivae are normal Head: Atraumatic HEENT: No congestion/rhinnorhea. Mucous membranes are moist.  Oropharynx non-erythematous Neck:   Nontender with no meningismus, no masses, no stridor Cardiovascular: Normal rate, regular rhythm. Grossly normal heart sounds.  Good peripheral circulation.rate 89 Respiratory: Normal respiratory effort.  No retractions. Lungs CTAB. Abdominal: Soft and nontender. No distention. No guarding no rebound Back:  There is no focal tenderness or step off.  there is no midline tenderness there are no lesions noted. there is no CVA tenderness  Musculoskeletal: No lower extremity tenderness, no upper extremity tenderness. No joint effusions, no DVT signs strong distal pulses no edema Neurologic:  Normal speech  and language. No gross focal neurologic deficits are appreciated.  Skin:  Skin is warm, dry and intact. No rash noted. Psychiatric: Mood and affect are on the anxious. Speech and behavior are normal.  ____________________________________________   LABS (all labs ordered are listed, but only abnormal results are displayed)  Labs Reviewed  COMPREHENSIVE METABOLIC PANEL - Abnormal; Notable for the following:       Result Value   CO2 19 (*)    Glucose, Bld 100 (*)    All other components within normal limits  ACETAMINOPHEN LEVEL - Abnormal; Notable for the following:    Acetaminophen (Tylenol), Serum <10 (*)    All other components within normal limits  CBC - Abnormal; Notable for the following:    MCV 78.0 (*)    MCH 24.9 (*)    RDW 19.2 (*)    All other components within normal limits  ETHANOL  SALICYLATE LEVEL  URINE DRUG SCREEN, QUALITATIVE ([facility] ONLY)  POC URINE PREG, ED  POCT PREGNANCY, URINE   ____________________________________________  EKG  I personally interpreted any EKGs ordered by me or triage Sinus tach rate 111 no acute ST elevation or depression normal axis unremarkable EKG aside from mild  tachycardia____________________________________________  RADIOLOGY  I reviewed any imaging ordered by me or triage that were performed during my shift and, if possible, patient and/or family made aware of any abnormal findings. ____________________________________________   PROCEDURES  Procedure(s) performed: None  Procedures  Critical Care performed: None  ____________________________________________   INITIAL IMPRESSION / ASSESSMENT AND PLAN / ED COURSE  Pertinent labs & imaging results that were available during my care of the patient were reviewed by me and considered in my medical decision making (see chart for details).  Patient did have some tachycardia on the way in but she certainly was quite anxious as well. She has had extensive workup for PE in the past with no evidence of ACS or PE on exam today and no complaints consistent with that. I don't think a third CT scan in 15 months to rule out PE is indicated. Note I think isn't evidence of ACS or dissection etc. Patient has no symptoms aside from feeling somewhat anxious at this time. She does not feel suicidal. We will see if our psychiatrist can evaluate her.  ----------------------------------------- 3:10 PM on 11/11/2016 -----------------------------------------  Signed otu at the end of my shift.     ____________________________________________   FINAL CLINICAL IMPRESSION(S) / ED DIAGNOSES  Final diagnoses:  None      This chart was dictated using voice recognition software.  Despite best efforts to proofread,  errors can occur which can change meaning.      Jeanmarie Plant, MD 11/11/16 1405    Jeanmarie Plant, MD 11/11/16 [phone]

## 2016-11-11 NOTE — ED Notes (Signed)
Brother Feliz Beam [phone]

## 2016-11-24 ENCOUNTER — Emergency Department: Payer: Medicaid Other

## 2016-11-24 ENCOUNTER — Emergency Department
Admission: EM | Admit: 2016-11-24 | Discharge: 2016-11-24 | Disposition: A | Discharge status: Home / Self Care | Payer: Medicaid Other | Attending: Emergency Medicine | Admitting: Emergency Medicine

## 2016-11-24 DIAGNOSIS — J45909 Unspecified asthma, uncomplicated: Secondary | ICD-10-CM | POA: Diagnosis not present

## 2016-11-24 DIAGNOSIS — Z87891 Personal history of nicotine dependence: Secondary | ICD-10-CM | POA: Insufficient documentation

## 2016-11-24 DIAGNOSIS — R102 Pelvic and perineal pain: Secondary | ICD-10-CM | POA: Diagnosis not present

## 2016-11-24 DIAGNOSIS — Z79899 Other long term (current) drug therapy: Secondary | ICD-10-CM | POA: Insufficient documentation

## 2016-11-24 DIAGNOSIS — R1030 Lower abdominal pain, unspecified: Secondary | ICD-10-CM | POA: Diagnosis present

## 2016-11-24 LAB — COMPREHENSIVE METABOLIC PANEL
ALK PHOS: 70 U/L (ref 38–126)
ALT: 20 U/L (ref 14–54)
AST: 23 U/L (ref 15–41)
Albumin: 4 g/dL (ref 3.5–5.0)
Anion gap: 6 (ref 5–15)
BUN: 12 mg/dL (ref 6–20)
CALCIUM: 9.4 mg/dL (ref 8.9–10.3)
CO2: 23 mmol/L (ref 22–32)
CREATININE: 0.78 mg/dL (ref 0.44–1.00)
Chloride: 109 mmol/L (ref 101–111)
GLUCOSE: 104 mg/dL — AB (ref 65–99)
Potassium: 3.9 mmol/L (ref 3.5–5.1)
SODIUM: 138 mmol/L (ref 135–145)
TOTAL PROTEIN: 7.7 g/dL (ref 6.5–8.1)
Total Bilirubin: 0.6 mg/dL (ref 0.3–1.2)

## 2016-11-24 LAB — URINALYSIS, COMPLETE (UACMP) WITH MICROSCOPIC
Bacteria, UA: NONE SEEN
Bilirubin Urine: NEGATIVE
GLUCOSE, UA: NEGATIVE mg/dL
HGB URINE DIPSTICK: NEGATIVE
Ketones, ur: NEGATIVE mg/dL
NITRITE: NEGATIVE
PROTEIN: NEGATIVE mg/dL
RBC / HPF: NONE SEEN RBC/hpf (ref 0–5)
Specific Gravity, Urine: 1.025 (ref 1.005–1.030)
pH: 6 (ref 5.0–8.0)

## 2016-11-24 LAB — CBC
HCT: 37 % (ref 35.0–47.0)
Hemoglobin: 12.1 g/dL (ref 12.0–16.0)
MCH: 25.3 pg — AB (ref 26.0–34.0)
MCHC: 32.8 g/dL (ref 32.0–36.0)
MCV: 77.3 fL — ABNORMAL LOW (ref 80.0–100.0)
PLATELETS: 278 10*3/uL (ref 150–440)
RBC: 4.79 MIL/uL (ref 3.80–5.20)
RDW: 18.6 % — AB (ref 11.5–14.5)
WBC: 5.6 10*3/uL (ref 3.6–11.0)

## 2016-11-24 LAB — LIPASE, BLOOD: Lipase: 31 U/L (ref 11–51)

## 2016-11-24 LAB — POCT PREGNANCY, URINE: Preg Test, Ur: NEGATIVE

## 2016-11-24 MED ORDER — NAPROXEN 500 MG PO TABS: 500.0000 mg | ORAL_TABLET | Freq: Two times a day (BID) | ORAL | 2 refills | Status: DC

## 2016-11-24 NOTE — ED Notes (Signed)
AAOx3.  Skin warm and dry.  NAD 

## 2016-11-24 NOTE — ED Provider Notes (Signed)
[facility] Emergency Department Provider Note   ____________________________________________    I have reviewed the triage vital signs and the nursing notes.   HISTORY  Chief Complaint Abdominal Pain     HPI Kristy Reid is a 21 y.o. female who presents with complaints of lower abdominal pain. Patient reports over the last week she has had intermittent cramping sensation in her lower abdomen/pelvis. She denies vaginal bleeding or vaginal discharge. No fevers or chills. No nausea or vomiting. Patient went to urgent care and was apparently referred to the ED. No dysuria.   Past Medical History:  Diagnosis Date  . Anemia   . Anemia   . Anxiety   . Asthma   . Bipolar 1 disorder (HCC)   . Chronic back pain   . Depression   . Insomnia   . Personality disorder    "boarderline"  . PTSD (post-traumatic stress disorder)     Patient Active Problem List   Diagnosis Date Noted  . Bipolar 2 disorder (HCC) 11/11/2016  . Noncompliance 11/11/2016  . Severe recurrent major depression with psychotic features (HCC) 07/18/2016  . Depression, major, recurrent, severe with psychosis (HCC) 07/17/2016  . Normal labor 05/07/2016  . Labor and delivery, indication for care 04/19/2016  . Indication for care in labor and delivery, antepartum 04/06/2016  . Decreased fetal movement 03/22/2016  . Abdominal pain in pregnancy 01/21/2016  . Constipation during pregnancy in second trimester 12/26/2015  . Overdose 11/29/2015  . Pregnancy (I trimester) 11/15/2015  . Cannabis use disorder, severe, dependence (HCC) 11/15/2015  . Tobacco use disorder 11/15/2015  . Asthma 11/15/2015  . Borderline personality disorder 11/15/2015  . PTSD (post-traumatic stress disorder) 11/15/2015  . Severe recurrent major depression without psychotic features (HCC) 11/13/2015    Past Surgical History:  Procedure Laterality Date  . BUNIONECTOMY Bilateral    feet  . wrist sugery       Prior to Admission medications   Medication Sig Start Date End Date Taking? Authorizing Provider  albuterol (PROVENTIL HFA;VENTOLIN HFA) 108 (90 Base) MCG/ACT inhaler Inhale 1-2 puffs into the lungs every 6 (six) hours as needed for wheezing or shortness of breath. 07/21/16   Pucilowska, [name], MD  brompheniramine-pseudoephedrine-DM 30-2-10 MG/5ML syrup Take 5 mLs by mouth 4 (four) times daily as needed. Patient not taking: Reported on 11/11/2016 10/11/16   Tommi Rumps, PA-C  cyclobenzaprine (FLEXERIL) 5 MG tablet take 1 tablet by mouth twice a day if needed Patient not taking: Reported on 11/11/2016 08/04/16   Tresea Mall, CNM  hydrOXYzine (ATARAX/VISTARIL) 50 MG tablet Take 1 tablet (50 mg total) by mouth 3 (three) times daily as needed for anxiety. Patient not taking: Reported on 11/11/2016 07/21/16   Pucilowska, Braulio Conte B, MD  loratadine (CLARITIN) 10 MG tablet Take 1 tablet (10 mg total) by mouth daily. 07/22/16   Pucilowska, Braulio Conte B, MD  naproxen (NAPROSYN) 500 MG tablet Take 1 tablet (500 mg total) by mouth 2 (two) times daily with a meal. 11/24/16   Jene Every, MD  ondansetron (ZOFRAN ODT) 4 MG disintegrating tablet Take 1 tablet (4 mg total) by mouth every 8 (eight) hours as needed for nausea or vomiting. Patient not taking: Reported on 11/11/2016 08/02/16   Willy Eddy, MD  ondansetron (ZOFRAN) 4 MG tablet Take 1 tablet (4 mg total) by mouth daily as needed. Patient not taking: Reported on 11/11/2016 10/23/16   Myrna Blazer, MD  QUEtiapine (SEROQUEL) 300 MG tablet Take 1  tablet (300 mg total) by mouth at bedtime. 11/11/16   [name], Jackquline DenmarkJohn T, MD  sertraline (ZOLOFT) 100 MG tablet Take 1 tablet (100 mg total) by mouth daily. 11/11/16   [name], Jackquline DenmarkJohn T, MD  traMADol (ULTRAM) 50 MG tablet Take 1 tablet (50 mg total) by mouth every 6 (six) hours as needed for moderate pain. Patient not taking: Reported on 11/11/2016 10/08/16   Joni ReiningSmith, Ronald K, PA-C  traZODone (DESYREL)  150 MG tablet Take 1 tablet (150 mg total) by mouth at bedtime as needed for sleep. 11/11/16   [name], Jackquline DenmarkJohn T, MD     Allergies Penicillins; Rocephin [ceftriaxone]; Zithromax [azithromycin]; and Lidocaine  Family History  Problem Relation Age of Onset  . Diabetes Mother   . Diabetes Maternal Grandmother   . Heart disease Maternal Grandmother   . Cancer Neg Hx   . Ovarian cancer Neg Hx     Social History Social History  Substance Use Topics  . Smoking status: Former Smoker    Types: Cigarettes    Quit date: 08/13/2015  . Smokeless tobacco: Never Used  . Alcohol use No    Review of Systems  Constitutional: No fever/chills Eyes: No visual changes.  ENT: No sore throat. Cardiovascular: Denies chest pain. Respiratory: Denies shortness of breath. Gastrointestinal: No nausea, no vomiting.   Genitourinary: Negative for dysuria.As above Musculoskeletal: Negative for back pain. Skin: Negative for rash. Neurological: Negative for headaches   ____________________________________________   PHYSICAL EXAM:  VITAL SIGNS: ED Triage Vitals  Enc Vitals Group     BP 11/24/16 0859 118/90     Pulse Rate 11/24/16 0859 97     Resp 11/24/16 0859 16     Temp 11/24/16 0859 99.1 F (37.3 C)     Temp Source 11/24/16 0859 Oral     SpO2 11/24/16 0859 100 %     Weight 11/24/16 0859 78.9 kg (174 lb)     Height 11/24/16 0859 1.575 m (5\' 2" )     Head Circumference --      Peak Flow --      Pain Score 11/24/16 0858 0     Pain Loc --      Pain Edu? --      Excl. in GC? --     Constitutional: Alert and oriented. No acute distress.  Eyes: Conjunctivae are normal.   Nose: No congestion/rhinnorhea. Mouth/Throat: Mucous membranes are moist.   Neck:  Painless ROM Cardiovascular: Normal rate, regular rhythm. Grossly normal heart sounds.  Good peripheral circulation. Respiratory: Normal respiratory effort.  No retractions. Lungs CTAB. Gastrointestinal: Soft and nontender. No distention.  No  CVA tenderness. Genitourinary: deferredPer patient request Musculoskeletal:   Warm and well perfused Neurologic:  Normal speech and language. No gross focal neurologic deficits are appreciated.  Skin:  Skin is warm, dry and intact. No rash noted. Psychiatric: Mood and affect are normal. Speech and behavior are normal.  ____________________________________________   LABS (all labs ordered are listed, but only abnormal results are displayed)  Labs Reviewed  COMPREHENSIVE METABOLIC PANEL - Abnormal; Notable for the following:       Result Value   Glucose, Bld 104 (*)    All other components within normal limits  CBC - Abnormal; Notable for the following:    MCV 77.3 (*)    MCH 25.3 (*)    RDW 18.6 (*)    All other components within normal limits  URINALYSIS, COMPLETE (UACMP) WITH MICROSCOPIC - Abnormal; Notable for the following:  Color, Urine YELLOW (*)    APPearance HAZY (*)    Leukocytes, UA TRACE (*)    Squamous Epithelial / LPF 6-30 (*)    All other components within normal limits  LIPASE, BLOOD  POC URINE PREG, ED  POCT PREGNANCY, URINE   ____________________________________________  EKG  None ____________________________________________  RADIOLOGY  Ultrasound unremarkable ____________________________________________   PROCEDURES  Procedure(s) performed: No    Critical Care performed: No ____________________________________________   INITIAL IMPRESSION / ASSESSMENT AND PLAN / ED COURSE  Pertinent labs & imaging results that were available during my care of the patient were reviewed by me and considered in my medical decision making (see chart for details).  Patient presents with lower abdominal cramping sensation, primarily pelvic. No vaginal discharge. No vaginal bleeding. Urine pregnancy is negative.  We will obtain ultrasound of the pelvis. Thus far lab work is reassuring. Urinalysis is unremarkable.  [place] normal, patient refused pelvic exam. Safe  for d/c at this time. Ok for outpatient follow up    ____________________________________________   FINAL CLINICAL IMPRESSION(S) / ED DIAGNOSES  Final diagnoses:  Pelvic pain  Pelvic pain      NEW MEDICATIONS STARTED DURING THIS VISIT:  Discharge Medication List as of 11/24/2016 11:15 AM       Note:  This document was prepared using Dragon voice recognition software and may include unintentional dictation errors.    Jene Every, MD 11/24/16 [phone]

## 2016-11-24 NOTE — ED Notes (Signed)
Patient in [place].

## 2016-11-24 NOTE — ED Triage Notes (Signed)
Pt c/o intermittent lower abd cramping for the past week and was seen [facility] and was referred to the ED. Denies N/V/D/fever..Marland Kitchen

## 2016-12-23 ENCOUNTER — Emergency Department
Admission: EM | Admit: 2016-12-23 | Discharge: 2016-12-23 | Disposition: A | Discharge status: Home / Self Care | Payer: Medicaid Other | Attending: Emergency Medicine | Admitting: Emergency Medicine

## 2016-12-23 DIAGNOSIS — J45909 Unspecified asthma, uncomplicated: Secondary | ICD-10-CM | POA: Diagnosis not present

## 2016-12-23 DIAGNOSIS — Z87891 Personal history of nicotine dependence: Secondary | ICD-10-CM | POA: Insufficient documentation

## 2016-12-23 DIAGNOSIS — L02411 Cutaneous abscess of right axilla: Secondary | ICD-10-CM | POA: Insufficient documentation

## 2016-12-23 DIAGNOSIS — J069 Acute upper respiratory infection, unspecified: Secondary | ICD-10-CM | POA: Diagnosis not present

## 2016-12-23 DIAGNOSIS — R0981 Nasal congestion: Secondary | ICD-10-CM | POA: Diagnosis present

## 2016-12-23 DIAGNOSIS — Z79899 Other long term (current) drug therapy: Secondary | ICD-10-CM | POA: Diagnosis not present

## 2016-12-23 MED ORDER — SULFAMETHOXAZOLE-TRIMETHOPRIM 800-160 MG PO TABS: 1.0000 | ORAL_TABLET | Freq: Two times a day (BID) | ORAL | 0 refills | Status: DC

## 2016-12-23 MED ORDER — GUAIFENESIN-CODEINE 100-10 MG/5ML PO SOLN: 5.0000 mL | Freq: Four times a day (QID) | ORAL | 0 refills | Status: DC | PRN

## 2016-12-23 NOTE — Discharge Instructions (Signed)
Follow-up with your primary care doctor if any continued problems. Begin taking Robitussin with codeine as needed for cough and only as directed. Do not drive or operate machinery while taking this medication. Also increase fluids and continue Tylenol or ibuprofen. Begin taking Bactrim DS twice a day for 10 days. Use warm compresses frequently to your right arm pit.

## 2016-12-23 NOTE — ED Triage Notes (Signed)
Pt is present with headache and body aches. Flu vac was gotten yesterday today the onset of cough. Pt is a/o and afebrile at this time.

## 2016-12-23 NOTE — ED Provider Notes (Signed)
[facility] Emergency Department Provider Note  ____________________________________________   First MD Initiated Contact with Patient 12/23/16 1705     (approximate)  I have reviewed the triage vital signs and the nursing notes.   HISTORY  Chief Complaint Headache and Generalized Body Aches  HPI [name] is a 21 y.o. female is here with 2 days of nasal congestion, sore throat, and nonproductive cough.Patient been taking over-the-counter medication without any relief. She also has been coughing during the night and states she has not been sleeping well. She denies any vomiting or diarrhea. She is unaware of any fever but may have had chills. She has been taking Tylenol, ibuprofen, Sudafed and TheraFlu.  Patient also believes that she has an abscess starting under her right axilla. Patient states there is very tender places been there for several days.   Past Medical History:  Diagnosis Date  . Anemia   . Anemia   . Anxiety   . Asthma   . Bipolar 1 disorder (HCC)   . Chronic back pain   . Depression   . Insomnia   . Personality disorder    "boarderline"  . PTSD (post-traumatic stress disorder)     Patient Active Problem List   Diagnosis Date Noted  . Bipolar 2 disorder (HCC) 11/11/2016  . Noncompliance 11/11/2016  . Severe recurrent major depression with psychotic features (HCC) 07/18/2016  . Depression, major, recurrent, severe with psychosis (HCC) 07/17/2016  . Normal labor 05/07/2016  . Labor and delivery, indication for care 04/19/2016  . Indication for care in labor and delivery, antepartum 04/06/2016  . Decreased fetal movement 03/22/2016  . Abdominal pain in pregnancy 01/21/2016  . Constipation during pregnancy in second trimester 12/26/2015  . Overdose 11/29/2015  . Pregnancy (I trimester) 11/15/2015  . Cannabis use disorder, severe, dependence (HCC) 11/15/2015  . Tobacco use disorder 11/15/2015  . Asthma 11/15/2015  .  Borderline personality disorder (HCC) 11/15/2015  . PTSD (post-traumatic stress disorder) 11/15/2015  . Severe recurrent major depression without psychotic features (HCC) 11/13/2015    Past Surgical History:  Procedure Laterality Date  . BUNIONECTOMY Bilateral    feet  . wrist sugery      Prior to Admission medications   Medication Sig Start Date End Date Taking? Authorizing Provider  albuterol (PROVENTIL HFA;VENTOLIN HFA) 108 (90 Base) MCG/ACT inhaler Inhale 1-2 puffs into the lungs every 6 (six) hours as needed for wheezing or shortness of breath. 07/21/16   Pucilowska, [name], MD  guaiFENesin-codeine 100-10 MG/5ML syrup Take 5 mLs by mouth every 6 (six) hours as needed. 12/23/16   Tommi Rumps, PA-C  loratadine (CLARITIN) 10 MG tablet Take 1 tablet (10 mg total) by mouth daily. 07/22/16   Pucilowska, [name], MD  QUEtiapine (SEROQUEL) 300 MG tablet Take 1 tablet (300 mg total) by mouth at bedtime. 11/11/16   [name], [name], MD  sertraline (ZOLOFT) 100 MG tablet Take 1 tablet (100 mg total) by mouth daily. 11/11/16   [name], [name], MD  sulfamethoxazole-trimethoprim (BACTRIM DS,SEPTRA DS) 800-160 MG tablet Take 1 tablet by mouth 2 (two) times daily. 12/23/16   Tommi Rumps, PA-C  traZODone (DESYREL) 150 MG tablet Take 1 tablet (150 mg total) by mouth at bedtime as needed for sleep. 11/11/16   [name], [name], MD    Allergies Penicillins; Rocephin [ceftriaxone]; Zithromax [azithromycin]; and Lidocaine  Family History  Problem Relation Age of Onset  . Diabetes Mother   . Diabetes Maternal  Grandmother   . Heart disease Maternal Grandmother   . Cancer Neg Hx   . Ovarian cancer Neg Hx     Social History Social History  Substance Use Topics  . Smoking status: Former Smoker    Types: Cigarettes    Quit date: 08/13/2015  . Smokeless tobacco: Never Used  . Alcohol use No    Review of Systems Constitutional: Subjective fever/chills Eyes: No visual changes. ENT: No  sore throat. Positive nasal congestion. Positive sore throat. Cardiovascular: Denies chest pain. Respiratory: Denies shortness of breath. Positive nonproductive cough. Gastrointestinal: No abdominal pain.  No nausea, no vomiting.  No diarrhea.   Musculoskeletal: Positive generalized body aches. Skin: Negative for rash. Neurological: Positive for headaches, negative for focal weakness or numbness. ____________________________________________   PHYSICAL EXAM:  VITAL SIGNS: ED Triage Vitals  Enc Vitals Group     BP 12/23/16 1657 126/70     Pulse Rate 12/23/16 1657 (!) 108     Resp 12/23/16 1657 20     Temp 12/23/16 1657 98.4 F (36.9 C)     Temp Source 12/23/16 1657 Oral     SpO2 12/23/16 1657 100 %     Weight 12/23/16 1658 174 lb (78.9 kg)     Height 12/23/16 1658  (1.575 m)     Head Circumference --      Peak Flow --      Pain Score --      Pain Loc --      Pain Edu? --      Excl. in GC? --    Constitutional: Alert and oriented. Well appearing and in no acute distress. Eyes: Conjunctivae are normal.  Head: Atraumatic. Nose: Moderate congestion/minimal rhinnorhea.  EACs clear bilaterally. TMs are dull without injection or erythema bilaterally. Mouth/Throat: Mucous membranes are moist.  Oropharynx non-erythematous. Moderate posterior drainage present. Neck: No stridor.   Hematological/Lymphatic/Immunilogical: No cervical lymphadenopathy. Cardiovascular: Normal rate, regular rhythm. Grossly normal heart sounds.  Good peripheral circulation. Respiratory: Normal respiratory effort.  No retractions. Lungs CTAB. Musculoskeletal: Moves upper and lower extremities without any difficulty. Normal gait was noted. Neurologic:  Normal speech and language. No gross focal neurologic deficits are appreciated. Skin:  Skin is warm, dry and intact. Right axilla there is a 2 cm tender nodule with minimal erythema. Area is firm.   Psychiatric: Mood and affect are normal. Speech and behavior  are normal.  ____________________________________________   LABS (all labs ordered are listed, but only abnormal results are displayed)  Labs Reviewed - No data to display  PROCEDURES  Procedure(s) performed: None  Procedures  Critical Care performed: No  ____________________________________________   INITIAL IMPRESSION / ASSESSMENT AND PLAN / ED COURSE Patient was discharged with a prescription for Robitussin-AC 1 teaspoon every 6 hours as needed for cough. Patient is aware that she can take Tylenol or ibuprofen with this medication. She is also given a prescription for Bactrim DS twice a day for 10 days and encouraged to use warm compresses to the right axilla for her early abscess. She is return to the emergency room if any severe worsening of her symptoms.  ____________________________________________   FINAL CLINICAL IMPRESSION(S) / ED DIAGNOSES  Final diagnoses:  Viral upper respiratory illness  Abscess of right axilla      NEW MEDICATIONS STARTED DURING THIS VISIT:  Discharge Medication List as of 12/23/2016  5:24 PM    START taking these medications   Details  guaiFENesin-codeine 100-10 MG/5ML syrup Take 5 mLs by mouth  every 6 (six) hours as needed., Starting Wed 12/23/2016, Print    sulfamethoxazole-trimethoprim (BACTRIM DS,SEPTRA DS) 800-160 MG tablet Take 1 tablet by mouth 2 (two) times daily., Starting Wed 12/23/2016, Print         Note:  This document was prepared using Dragon voice recognition software and may include unintentional dictation errors.    Tommi Rumps, PA-C 12/23/16 2111    Loleta Rose, MD 12/23/16 [phone]

## 2016-12-23 NOTE — ED Notes (Signed)
Pt reports generalized body aches with headache - c/o sore throat, runny nose, nasal congestion She also states she has "bump" under right arm that she expressed pus out of and that the area has increased in size and pain

## 2016-12-31 ENCOUNTER — Encounter: Payer: Self-pay | Admitting: Emergency Medicine

## 2016-12-31 ENCOUNTER — Emergency Department
Admission: EM | Admit: 2016-12-31 | Discharge: 2016-12-31 | Disposition: A | Discharge status: Home / Self Care | Payer: Medicaid Other | Attending: Emergency Medicine | Admitting: Emergency Medicine

## 2016-12-31 DIAGNOSIS — Z0189 Encounter for other specified special examinations: Secondary | ICD-10-CM | POA: Insufficient documentation

## 2016-12-31 DIAGNOSIS — Z880 Allergy status to penicillin: Secondary | ICD-10-CM | POA: Insufficient documentation

## 2016-12-31 DIAGNOSIS — J45909 Unspecified asthma, uncomplicated: Secondary | ICD-10-CM | POA: Diagnosis not present

## 2016-12-31 DIAGNOSIS — Z7689 Persons encountering health services in other specified circumstances: Secondary | ICD-10-CM

## 2016-12-31 DIAGNOSIS — L02411 Cutaneous abscess of right axilla: Secondary | ICD-10-CM | POA: Diagnosis not present

## 2016-12-31 DIAGNOSIS — Z87891 Personal history of nicotine dependence: Secondary | ICD-10-CM | POA: Diagnosis not present

## 2016-12-31 DIAGNOSIS — Z79899 Other long term (current) drug therapy: Secondary | ICD-10-CM | POA: Diagnosis not present

## 2016-12-31 MED ORDER — HYDROCODONE-ACETAMINOPHEN 5-325 MG PO TABS
1.0000 | ORAL_TABLET | Freq: Once | ORAL | Status: AC
  Administered 2016-12-31: 1 via ORAL
  Filled 2016-12-31: qty 1

## 2016-12-31 MED ORDER — HYDROCODONE-ACETAMINOPHEN 5-325 MG PO TABS: 1.0000 | ORAL_TABLET | Freq: Four times a day (QID) | ORAL | 0 refills | Status: DC | PRN

## 2016-12-31 MED ORDER — LIDOCAINE HCL (PF) 1 % IJ SOLN
5.0000 mL | Freq: Once | INTRAMUSCULAR | Status: AC
  Administered 2016-12-31: 5 mL
  Filled 2016-12-31: qty 5

## 2016-12-31 MED ORDER — SULFAMETHOXAZOLE-TRIMETHOPRIM 800-160 MG PO TABS
1.0000 | ORAL_TABLET | Freq: Once | ORAL | Status: AC
  Administered 2016-12-31: 1 via ORAL
  Filled 2016-12-31: qty 1

## 2016-12-31 NOTE — ED Provider Notes (Signed)
[facility] Emergency Department Provider Note ____________________________________________  Time seen: 0937  I have reviewed the triage vital signs and the nursing notes.  HISTORY  Chief Complaint  Abscess  HPI Kristy Reid is a 21 y.o. female Presented to the ED for evaluation management of an abscess of the right axilla. Patient was seen 2 weeks prior, started on Bactrim empirically, for a non-drainable cellulitis at the time. She presents now with increased pain, tenderness, and fluctuance. She denies any fevers, chills, sweats, or spontaneous drainage. She has completed the previously prescribed course of Bactrim.  Past Medical History:  Diagnosis Date  . Anemia   . Anemia   . Anxiety   . Asthma   . Bipolar 1 disorder (HCC)   . Chronic back pain   . Depression   . Insomnia   . Personality disorder (HCC)    "boarderline"  . PTSD (post-traumatic stress disorder)     Patient Active Problem List   Diagnosis Date Noted  . Bipolar 2 disorder (HCC) 11/11/2016  . Noncompliance 11/11/2016  . Severe recurrent major depression with psychotic features (HCC) 07/18/2016  . Depression, major, recurrent, severe with psychosis (HCC) 07/17/2016  . Normal labor 05/07/2016  . Labor and delivery, indication for care 04/19/2016  . Indication for care in labor and delivery, antepartum 04/06/2016  . Decreased fetal movement 03/22/2016  . Abdominal pain in pregnancy 01/21/2016  . Constipation during pregnancy in second trimester 12/26/2015  . Overdose 11/29/2015  . Pregnancy (I trimester) 11/15/2015  . Cannabis use disorder, severe, dependence (HCC) 11/15/2015  . Tobacco use disorder 11/15/2015  . Asthma 11/15/2015  . Borderline personality disorder (HCC) 11/15/2015  . PTSD (post-traumatic stress disorder) 11/15/2015  . Severe recurrent major depression without psychotic features (HCC) 11/13/2015    Past Surgical History:  Procedure Laterality Date  .  BUNIONECTOMY Bilateral    feet  . wrist sugery      Prior to Admission medications   Medication Sig Start Date End Date Taking? Authorizing Provider  albuterol (PROVENTIL HFA;VENTOLIN HFA) 108 (90 Base) MCG/ACT inhaler Inhale 1-2 puffs into the lungs every 6 (six) hours as needed for wheezing or shortness of breath. 07/21/16   Pucilowska, [name], MD  guaiFENesin-codeine 100-10 MG/5ML syrup Take 5 mLs by mouth every 6 (six) hours as needed. 12/23/16   Tommi RumpsSummers, Rhonda L, PA-C  HYDROcodone-acetaminophen (NORCO) 5-325 MG tablet Take 1 tablet by mouth every 6 (six) hours as needed. 12/31/16   Richardine Peppers, Charlesetta IvoryJenise V Bacon, PA-C  loratadine (CLARITIN) 10 MG tablet Take 1 tablet (10 mg total) by mouth daily. 07/22/16   Pucilowska, [name], MD  QUEtiapine (SEROQUEL) 300 MG tablet Take 1 tablet (300 mg total) by mouth at bedtime. 11/11/16   [name], Jackquline DenmarkJohn T, MD  sertraline (ZOLOFT) 100 MG tablet Take 1 tablet (100 mg total) by mouth daily. 11/11/16   [name], Jackquline DenmarkJohn T, MD  sulfamethoxazole-trimethoprim (BACTRIM DS,SEPTRA DS) 800-160 MG tablet Take 1 tablet by mouth 2 (two) times daily. 12/23/16   Tommi RumpsSummers, Rhonda L, PA-C  traZODone (DESYREL) 150 MG tablet Take 1 tablet (150 mg total) by mouth at bedtime as needed for sleep. 11/11/16   [name], Jackquline DenmarkJohn T, MD    Allergies Penicillins; Rocephin [ceftriaxone]; Zithromax [azithromycin]; and Lidocaine  Family History  Problem Relation Age of Onset  . Diabetes Mother   . Diabetes Maternal Grandmother   . Heart disease Maternal Grandmother   . Cancer Neg Hx   . Ovarian cancer Neg Hx  Social History Social History  Substance Use Topics  . Smoking status: Former Smoker    Types: Cigarettes    Quit date: 08/13/2015  . Smokeless tobacco: Never Used  . Alcohol use No    Review of Systems  Constitutional: Negative for fever. Cardiovascular: Negative for chest pain. Respiratory: Negative for shortness of breath. Musculoskeletal: Negative for back pain. Skin:  Negative for rash. Right axilla abscess as above Neurological: Negative for headaches, focal weakness or numbness. ____________________________________________  PHYSICAL EXAM:  VITAL SIGNS: ED Triage Vitals [12/31/16 0847]  Enc Vitals Group     BP 126/79     Pulse Rate (!) 104     Resp 18     Temp 98.3 F (36.8 C)     Temp Source Oral     SpO2 100 %     Weight 179 lb (81.2 kg)     Height 5\' 2"  (1.575 m)     Head Circumference      Peak Flow      Pain Score 8     Pain Loc      Pain Edu?      Excl. in GC?     Constitutional: Alert and oriented. Well appearing and in no distress. Head: Normocephalic and atraumatic. Cardiovascular: Normal rate, regular rhythm. Normal distal pulses. Respiratory: Normal respiratory effort.  Musculoskeletal: Nontender with normal range of motion in all extremities.  Neurologic:  Normal gait without ataxia. Normal speech and language. No gross focal neurologic deficits are appreciated. Skin:  Skin is warm, dry and intact. No rash noted. Right axilla with a tender, moderately sized, firm, yet fluctuant abscess collection. There is an overlying pustule noted, but no spontaneous drainage. No local erythema is appreciated. ____________________________________________  PROCEDURES  Bactrim DS 1 PO Norco 5-325 mg PO  INCISION AND DRAINAGE Performed by: Lissa Hoard Consent: Verbal consent obtained. Risks and benefits: risks, benefits and alternatives were discussed Type: abscess  Body area: right axilla  Anesthesia: local infiltration  Incision was made with a scalpel.  Local anesthetic: lidocaine 1% w/o epinephrine  Anesthetic total: 5 ml  Complexity: complex Blunt dissection to break up loculations  Drainage: purulent  Drainage amount: moderate  Packing material: 1/4 in iodoform gauze  Patient tolerance: Patient tolerated the procedure well with no immediate  complications. ____________________________________________  INITIAL IMPRESSION / ASSESSMENT AND PLAN / ED COURSE  Patient with the ED evaluation of right axilla abscess. Patient tolerated I&D procedure to the focal abscess with good expression of purulent material. The wound was appropriately packed and dressed. The patient were discharged with pain medicine and wound care structures. She will not be given a prescription for Bactrim at this time as she completed a course recently. She will follow with primary provider in 2-3 days for wound check and packing removal. Return to the ED as needed for worsening symptoms. ____________________________________________  FINAL CLINICAL IMPRESSION(S) / ED DIAGNOSES  Final diagnoses:  Abscess of axilla, right  Encounter for incision and drainage procedure      Lissa Hoard, PA-C 12/31/16 1044    Governor Rooks, MD 12/31/16 1243

## 2016-12-31 NOTE — ED Triage Notes (Signed)
Pt here with abscess to right axilla. Present X 2 weeks. Was given abx for it but did not go all the way and still hurts. No redness visualized. Appears to possibly need to be drained.  No fevers. VSS

## 2016-12-31 NOTE — ED Notes (Signed)
Pt lying in bed, asking for remote and HOB to be elevated. Pt appears in no distress.

## 2016-12-31 NOTE — Discharge Instructions (Signed)
Keep the area clean, dry, and covered. Follow-up with Dr. Carlynn PurlSowles in 3 days for packing removal. Take the pain medicine as needed.

## 2017-01-04 ENCOUNTER — Encounter: Payer: Self-pay | Admitting: Emergency Medicine

## 2017-01-04 ENCOUNTER — Emergency Department
Admission: EM | Admit: 2017-01-04 | Discharge: 2017-01-04 | Disposition: A | Discharge status: Home / Self Care | Payer: Medicaid Other | Attending: Emergency Medicine | Admitting: Emergency Medicine

## 2017-01-04 DIAGNOSIS — D649 Anemia, unspecified: Secondary | ICD-10-CM | POA: Insufficient documentation

## 2017-01-04 DIAGNOSIS — G8929 Other chronic pain: Secondary | ICD-10-CM | POA: Insufficient documentation

## 2017-01-04 DIAGNOSIS — Z7901 Long term (current) use of anticoagulants: Secondary | ICD-10-CM | POA: Insufficient documentation

## 2017-01-04 DIAGNOSIS — Z87891 Personal history of nicotine dependence: Secondary | ICD-10-CM | POA: Insufficient documentation

## 2017-01-04 DIAGNOSIS — Z79899 Other long term (current) drug therapy: Secondary | ICD-10-CM | POA: Insufficient documentation

## 2017-01-04 DIAGNOSIS — Z5189 Encounter for other specified aftercare: Secondary | ICD-10-CM | POA: Insufficient documentation

## 2017-01-04 DIAGNOSIS — J45909 Unspecified asthma, uncomplicated: Secondary | ICD-10-CM | POA: Insufficient documentation

## 2017-01-04 NOTE — ED Provider Notes (Signed)
[facility] Emergency Department Provider Note  ____________________________________________   First MD Initiated Contact with Patient 01/04/17 [phone]     (approximate)  I have reviewed the triage vital signs and the nursing notes.   HISTORY  Chief Complaint Wound Check   HPI Kristy Reid is a 21 y.o. female  is here for removal of packing from an I&D that was done to her right axilla 3 days ago. Patient states that she has not had any continued problems and denies any fever or chills.   Past Medical History:  Diagnosis Date  . Anemia   . Anemia   . Anxiety   . Asthma   . Bipolar 1 disorder (HCC)   . Chronic back pain   . Depression   . Insomnia   . Personality disorder (HCC)    "boarderline"  . PTSD (post-traumatic stress disorder)     Patient Active Problem List   Diagnosis Date Noted  . Bipolar 2 disorder (HCC) 11/11/2016  . Noncompliance 11/11/2016  . Severe recurrent major depression with psychotic features (HCC) 07/18/2016  . Depression, major, recurrent, severe with psychosis (HCC) 07/17/2016  . Normal labor 05/07/2016  . Labor and delivery, indication for care 04/19/2016  . Indication for care in labor and delivery, antepartum 04/06/2016  . Decreased fetal movement 03/22/2016  . Abdominal pain in pregnancy 01/21/2016  . Constipation during pregnancy in second trimester 12/26/2015  . Overdose 11/29/2015  . Pregnancy (I trimester) 11/15/2015  . Cannabis use disorder, severe, dependence (HCC) 11/15/2015  . Tobacco use disorder 11/15/2015  . Asthma 11/15/2015  . Borderline personality disorder (HCC) 11/15/2015  . PTSD (post-traumatic stress disorder) 11/15/2015  . Severe recurrent major depression without psychotic features (HCC) 11/13/2015    Past Surgical History:  Procedure Laterality Date  . BUNIONECTOMY Bilateral    feet  . wrist sugery      Prior to Admission medications   Medication Sig Start Date End Date Taking?  Authorizing Provider  albuterol (PROVENTIL HFA;VENTOLIN HFA) 108 (90 Base) MCG/ACT inhaler Inhale 1-2 puffs into the lungs every 6 (six) hours as needed for wheezing or shortness of breath. 07/21/16   Pucilowska, Braulio ConteJolanta B, MD  loratadine (CLARITIN) 10 MG tablet Take 1 tablet (10 mg total) by mouth daily. 07/22/16   Pucilowska, [name], MD  QUEtiapine (SEROQUEL) 300 MG tablet Take 1 tablet (300 mg total) by mouth at bedtime. 11/11/16   [name], Jackquline DenmarkJohn T, MD  sertraline (ZOLOFT) 100 MG tablet Take 1 tablet (100 mg total) by mouth daily. 11/11/16   [name], Jackquline DenmarkJohn T, MD  traZODone (DESYREL) 150 MG tablet Take 1 tablet (150 mg total) by mouth at bedtime as needed for sleep. 11/11/16   [name], Jackquline DenmarkJohn T, MD    Allergies Penicillins; Rocephin [ceftriaxone]; Zithromax [azithromycin]; and Lidocaine  Family History  Problem Relation Age of Onset  . Diabetes Mother   . Diabetes Maternal Grandmother   . Heart disease Maternal Grandmother   . Cancer Neg Hx   . Ovarian cancer Neg Hx     Social History Social History  Substance Use Topics  . Smoking status: Former Smoker    Types: Cigarettes    Quit date: 08/13/2015  . Smokeless tobacco: Never Used  . Alcohol use No    Review of Systems Constitutional: No fever/chills Cardiovascular: Denies chest pain. Respiratory: Denies shortness of breath. Musculoskeletal: Negative for back pain. Skin: Negative for rash.  ____________________________________________   PHYSICAL EXAM:  VITAL SIGNS: ED Triage Vitals  Enc Vitals Group     BP 01/04/17 0755 126/71     Pulse Rate 01/04/17 0755 95     Resp 01/04/17 0755 20     Temp 01/04/17 0755 98.4 F (36.9 C)     Temp Source 01/04/17 0755 Oral     SpO2 01/04/17 0755 100 %     Weight 01/04/17 0755 185 lb (83.9 kg)     Height 01/04/17 0755 5\' 2"  (1.575 m)     Head Circumference --      Peak Flow --      Pain Score 01/04/17 0754 7     Pain Loc --      Pain Edu? --      Excl. in GC? --     Constitutional: Alert and oriented. Well appearing and in no acute distress. Eyes: Conjunctivae are normal.  Head: Atraumatic. Neck: No stridor.   Respiratory: Normal respiratory effort.  Skin:  Skin is warm, dry.  Abscess right axilla with minimal drainage present. During was removed without any difficulty. Psychiatric: Mood and affect are normal. Speech and behavior are normal.  ____________________________________________   LABS (all labs ordered are listed, but only abnormal results are displayed)  Labs Reviewed - No data to display  PROCEDURES  Procedure(s) performed: drain was removed by provider list listed above.  Procedures  Critical Care performed: No  ____________________________________________   INITIAL IMPRESSION / ASSESSMENT AND PLAN / ED COURSE  Patient will continue taking medication until completely finished. She'll follow-up with her PCP if any continued problems.  ____________________________________________   FINAL CLINICAL IMPRESSION(S) / ED DIAGNOSES  Final diagnoses:  Wound check, abscess      NEW MEDICATIONS STARTED DURING THIS VISIT:  Discharge Medication List as of 01/04/2017  8:14 AM       Note:  This document was prepared using Dragon voice recognition software and may include unintentional dictation errors.    Tommi Rumps, PA-C 01/04/17 1625    Rockne Menghini, MD 01/05/17 2258

## 2017-01-04 NOTE — ED Notes (Signed)
Right axilla wound with yellow drainage, packing in place, firm edges.

## 2017-01-04 NOTE — Discharge Instructions (Signed)
Change dressing daily. Keep area clean and dry.[facility].

## 2017-01-04 NOTE — ED Triage Notes (Signed)
States I and D R axilla 3 days ago, here for packing removal.

## 2017-01-23 ENCOUNTER — Emergency Department
Admission: EM | Admit: 2017-01-23 | Discharge: 2017-01-23 | Disposition: A | Discharge status: Home / Self Care | Payer: Medicaid Other | Attending: Emergency Medicine | Admitting: Emergency Medicine

## 2017-01-23 ENCOUNTER — Other Ambulatory Visit: Payer: Self-pay

## 2017-01-23 ENCOUNTER — Encounter: Payer: Self-pay | Admitting: Emergency Medicine

## 2017-01-23 DIAGNOSIS — R1084 Generalized abdominal pain: Secondary | ICD-10-CM | POA: Diagnosis not present

## 2017-01-23 DIAGNOSIS — B349 Viral infection, unspecified: Secondary | ICD-10-CM

## 2017-01-23 DIAGNOSIS — J029 Acute pharyngitis, unspecified: Secondary | ICD-10-CM | POA: Diagnosis present

## 2017-01-23 DIAGNOSIS — Z79899 Other long term (current) drug therapy: Secondary | ICD-10-CM | POA: Insufficient documentation

## 2017-01-23 DIAGNOSIS — Z87891 Personal history of nicotine dependence: Secondary | ICD-10-CM | POA: Insufficient documentation

## 2017-01-23 DIAGNOSIS — J45909 Unspecified asthma, uncomplicated: Secondary | ICD-10-CM | POA: Diagnosis not present

## 2017-01-23 LAB — URINALYSIS, COMPLETE (UACMP) WITH MICROSCOPIC
Bacteria, UA: NONE SEEN
Bilirubin Urine: NEGATIVE
GLUCOSE, UA: NEGATIVE mg/dL
Hgb urine dipstick: NEGATIVE
Ketones, ur: NEGATIVE mg/dL
Leukocytes, UA: NEGATIVE
Nitrite: NEGATIVE
PH: 5 (ref 5.0–8.0)
PROTEIN: NEGATIVE mg/dL
Specific Gravity, Urine: 1.014 (ref 1.005–1.030)

## 2017-01-23 LAB — POCT RAPID STREP A: Streptococcus, Group A Screen (Direct): NEGATIVE

## 2017-01-23 LAB — POCT PREGNANCY, URINE: Preg Test, Ur: NEGATIVE

## 2017-01-23 MED ORDER — PSEUDOEPH-BROMPHEN-DM 30-2-10 MG/5ML PO SYRP: 5.0000 mL | ORAL_SOLUTION | Freq: Four times a day (QID) | ORAL | 0 refills | Status: DC | PRN

## 2017-01-23 MED ORDER — DICYCLOMINE HCL 20 MG PO TABS: 20.0000 mg | ORAL_TABLET | Freq: Three times a day (TID) | ORAL | 0 refills | Status: DC | PRN

## 2017-01-23 NOTE — ED Triage Notes (Signed)
States sore throat and body aches since yesterday.

## 2017-01-23 NOTE — ED Provider Notes (Signed)
[facility] Emergency Department Provider Note  ____________________________________________  Time seen: Approximately 9:54 AM  I have reviewed the triage vital signs and the nursing notes.   HISTORY  Chief Complaint Sore Throat   HPI Kristy Reid is a 21 y.o. female who presents to the emergency department for treatment and evaluation of sore throat started yesterday. She also states that she is aching all over. She denies known fever but states that she was "hot" yesterday. She also states that she has had bilateral earache since this morning and diffuse abdominal cramping. She denies any nausea, vomiting, or diarrhea.   Past Medical History:  Diagnosis Date  . Anemia   . Anemia   . Anxiety   . Asthma   . Bipolar 1 disorder (HCC)   . Chronic back pain   . Depression   . Insomnia   . Personality disorder (HCC)    "boarderline"  . PTSD (post-traumatic stress disorder)     Patient Active Problem List   Diagnosis Date Noted  . Bipolar 2 disorder (HCC) 11/11/2016  . Noncompliance 11/11/2016  . Severe recurrent major depression with psychotic features (HCC) 07/18/2016  . Depression, major, recurrent, severe with psychosis (HCC) 07/17/2016  . Normal labor 05/07/2016  . Labor and delivery, indication for care 04/19/2016  . Indication for care in labor and delivery, antepartum 04/06/2016  . Decreased fetal movement 03/22/2016  . Abdominal pain in pregnancy 01/21/2016  . Constipation during pregnancy in second trimester 12/26/2015  . Overdose 11/29/2015  . Pregnancy (I trimester) 11/15/2015  . Cannabis use disorder, severe, dependence (HCC) 11/15/2015  . Tobacco use disorder 11/15/2015  . Asthma 11/15/2015  . Borderline personality disorder (HCC) 11/15/2015  . PTSD (post-traumatic stress disorder) 11/15/2015  . Severe recurrent major depression without psychotic features (HCC) 11/13/2015    Past Surgical History:  Procedure Laterality Date  .  BUNIONECTOMY Bilateral    feet  . wrist sugery      Prior to Admission medications   Medication Sig Start Date End Date Taking? Authorizing Provider  albuterol (PROVENTIL HFA;VENTOLIN HFA) 108 (90 Base) MCG/ACT inhaler Inhale 1-2 puffs into the lungs every 6 (six) hours as needed for wheezing or shortness of breath. 07/21/16   Pucilowska, Braulio ConteJolanta B, MD  brompheniramine-pseudoephedrine-DM 30-2-10 MG/5ML syrup Take 5 mLs 4 (four) times daily as needed by mouth. 01/23/17   Jaynee Winters B, FNP  dicyclomine (BENTYL) 20 MG tablet Take 1 tablet (20 mg total) 3 (three) times daily as needed by mouth for spasms. 01/23/17 01/23/18  Aristidis Talerico, Kasandra Knudsenari B, FNP  loratadine (CLARITIN) 10 MG tablet Take 1 tablet (10 mg total) by mouth daily. 07/22/16   Pucilowska, [name], MD  QUEtiapine (SEROQUEL) 300 MG tablet Take 1 tablet (300 mg total) by mouth at bedtime. 11/11/16   [name], Jackquline DenmarkJohn T, MD  sertraline (ZOLOFT) 100 MG tablet Take 1 tablet (100 mg total) by mouth daily. 11/11/16   [name], Jackquline DenmarkJohn T, MD  traZODone (DESYREL) 150 MG tablet Take 1 tablet (150 mg total) by mouth at bedtime as needed for sleep. 11/11/16   [name], Jackquline DenmarkJohn T, MD    Allergies Penicillins; Rocephin [ceftriaxone]; Zithromax [azithromycin]; and Lidocaine  Family History  Problem Relation Age of Onset  . Diabetes Mother   . Diabetes Maternal Grandmother   . Heart disease Maternal Grandmother   . Cancer Neg Hx   . Ovarian cancer Neg Hx     Social History Social History   Tobacco Use  . Smoking  status: Former Smoker    Types: Cigarettes    Last attempt to quit: 08/13/2015    Years since quitting: 1.4  . Smokeless tobacco: Never Used  Substance Use Topics  . Alcohol use: No  . Drug use: Yes    Frequency: 1.0 times per week    Types: Marijuana    Comment: Last use 10/21/16    Review of Systems Constitutional: Questionable fever/negative for chills ENT: Negative for sore throat. Cardiovascular: Denies chest pain. Respiratory:  Negative for shortness of breath. Positive for cough. Gastrointestinal: Negative for nausea,  no vomiting.  No diarrhea.  Musculoskeletal: Positive for body aches Skin: Negative for rash. Neurological: Negative for headaches ____________________________________________   PHYSICAL EXAM:  VITAL SIGNS: ED Triage Vitals  Enc Vitals Group     BP 01/23/17 0845 114/60     Pulse Rate 01/23/17 0845 (!) 101     Resp 01/23/17 0845 20     Temp 01/23/17 0845 98.9 F (37.2 C)     Temp Source 01/23/17 0845 Oral     SpO2 01/23/17 0845 100 %     Weight 01/23/17 0845 175 lb (79.4 kg)     Height 01/23/17 0845 5\' 2"  (1.575 m)     Head Circumference --      Peak Flow --      Pain Score 01/23/17 0844 10     Pain Loc --      Pain Edu? --      Excl. in GC? --     Constitutional: Alert and oriented. Acutely ill appearing and in no acute distress. Eyes: Conjunctivae are normal. EOMI. Ears: Bilateral tympanic membranes appear normal Nose: No congestion noted; no rhinnorhea. Mouth/Throat: Mucous membranes are moist.  Oropharynx mildly erythematous. Tonsils 1+ without exudate. Neck: No stridor.  Lymphatic: No anterior cervical lymphadenopathy. Cardiovascular: Normal rate, regular rhythm. Good peripheral circulation. Respiratory: Normal respiratory effort.  No retractions. Breath sounds clear auscultation throughout. Gastrointestinal: Soft and nontender.  Musculoskeletal: FROM x 4 extremities.  Neurologic:  Normal speech and language.  Skin:  Skin is warm, dry and intact. No rash noted. Psychiatric: Mood and affect are normal. Speech and behavior are normal.  ____________________________________________   LABS (all labs ordered are listed, but only abnormal results are displayed)  Labs Reviewed  URINALYSIS, COMPLETE (UACMP) WITH MICROSCOPIC - Abnormal; Notable for the following components:      Result Value   Color, Urine YELLOW (*)    APPearance HAZY (*)    Squamous Epithelial / LPF 6-30  (*)    All other components within normal limits  POC URINE PREG, ED  POCT RAPID STREP A  POCT PREGNANCY, URINE   ____________________________________________  EKG  Not indicated ____________________________________________  RADIOLOGY  Not indicated ____________________________________________   PROCEDURES  Procedure(s) performed: None  Critical Care performed: No ____________________________________________   INITIAL IMPRESSION / ASSESSMENT AND PLAN / ED COURSE  21 year old female presenting to the emergency department for evaluation of sore throat, body aches, hot flashes, otalgia, and intermittent abdominal cramping. Rapid strep and urinalysis are all negative today. She will be given prescriptions for Bromfed and Bentyl. She was instructed to return to the emergency department for symptoms that change or worsen if she is unable schedule an appointment with her primary care provider.  Pertinent labs & imaging results that were available during my care of the patient were reviewed by me and considered in my medical decision making (see chart for details).  If controlled substance prescribed during this visit,  12 month history viewed on the [facility] prior to issuing an initial prescription for Schedule II or III opiod. ____________________________________________   FINAL CLINICAL IMPRESSION(S) / ED DIAGNOSES  Final diagnoses:  Abdominal cramping, generalized  Acute viral syndrome    Note:  This document was prepared using Dragon voice recognition software and may include unintentional dictation errors.     Chinita Pester, FNP 01/23/17 1514    Arnaldo Natal, MD 01/23/17 1520

## 2017-01-23 NOTE — ED Notes (Signed)
Pt to ed with c/o sore throat that started yesterday. Pt also reports body aches all over.  States she felt "hot" yesterday.  Also reports bilat earache.

## 2017-01-23 NOTE — Discharge Instructions (Signed)
Follow up with your primary care provider for symptoms that are not improving over the week. Return to the emergency department for symptoms that change or worsen if unable schedule an appointment.

## 2017-02-17 ENCOUNTER — Encounter: Payer: Self-pay | Admitting: Emergency Medicine

## 2017-02-17 ENCOUNTER — Emergency Department
Admission: EM | Admit: 2017-02-17 | Discharge: 2017-02-17 | Disposition: A | Discharge status: Home / Self Care | Payer: Medicaid Other | Attending: Emergency Medicine | Admitting: Emergency Medicine

## 2017-02-17 DIAGNOSIS — J45909 Unspecified asthma, uncomplicated: Secondary | ICD-10-CM | POA: Insufficient documentation

## 2017-02-17 DIAGNOSIS — Z87891 Personal history of nicotine dependence: Secondary | ICD-10-CM | POA: Diagnosis not present

## 2017-02-17 DIAGNOSIS — N946 Dysmenorrhea, unspecified: Secondary | ICD-10-CM | POA: Diagnosis not present

## 2017-02-17 DIAGNOSIS — R1030 Lower abdominal pain, unspecified: Secondary | ICD-10-CM | POA: Diagnosis present

## 2017-02-17 LAB — POCT PREGNANCY, URINE: Preg Test, Ur: NEGATIVE

## 2017-02-17 LAB — COMPREHENSIVE METABOLIC PANEL
ALBUMIN: 4 g/dL (ref 3.5–5.0)
ALK PHOS: 88 U/L (ref 38–126)
ALT: 11 U/L — AB (ref 14–54)
AST: 17 U/L (ref 15–41)
Anion gap: 11 (ref 5–15)
BUN: 9 mg/dL (ref 6–20)
CALCIUM: 9.6 mg/dL (ref 8.9–10.3)
CO2: 23 mmol/L (ref 22–32)
CREATININE: 0.86 mg/dL (ref 0.44–1.00)
Chloride: 105 mmol/L (ref 101–111)
GFR calc Af Amer: >60 mL/min (ref >60)
GFR calc non Af Amer: >60 mL/min (ref >60)
GLUCOSE: 98 mg/dL (ref 65–99)
Potassium: 3.8 mmol/L (ref 3.5–5.1)
SODIUM: 139 mmol/L (ref 135–145)
Total Bilirubin: 0.4 mg/dL (ref 0.3–1.2)
Total Protein: 7.7 g/dL (ref 6.5–8.1)

## 2017-02-17 LAB — URINALYSIS, COMPLETE (UACMP) WITH MICROSCOPIC
Bacteria, UA: NONE SEEN
Bilirubin Urine: NEGATIVE
GLUCOSE, UA: NEGATIVE mg/dL
Ketones, ur: NEGATIVE mg/dL
Leukocytes, UA: NEGATIVE
Nitrite: NEGATIVE
PROTEIN: 100 mg/dL — AB
Specific Gravity, Urine: 1.029 (ref 1.005–1.030)
pH: 5 (ref 5.0–8.0)

## 2017-02-17 LAB — CBC
HCT: 39.5 % (ref 35.0–47.0)
Hemoglobin: 12.8 g/dL (ref 12.0–16.0)
MCH: 26.1 pg (ref 26.0–34.0)
MCHC: 32.3 g/dL (ref 32.0–36.0)
MCV: 80.9 fL (ref 80.0–100.0)
PLATELETS: 268 10*3/uL (ref 150–440)
RBC: 4.89 MIL/uL (ref 3.80–5.20)
RDW: 17.3 % — AB (ref 11.5–14.5)
WBC: 5.9 10*3/uL (ref 3.6–11.0)

## 2017-02-17 LAB — LIPASE, BLOOD: Lipase: 28 U/L (ref 11–51)

## 2017-02-17 NOTE — Discharge Instructions (Signed)
Please seek medical attention for any high fevers, chest pain, shortness of breath, change in behavior, persistent vomiting, bloody stool or any other new or concerning symptoms.  

## 2017-02-17 NOTE — ED Triage Notes (Signed)
Pt to ED via POV with c/o abd pain and vaginal bleeding since yesterday. Pt states unknown lmp, pt A&Ox4, VS stable, NAD noted.

## 2017-02-17 NOTE — ED Notes (Signed)
PELVIC CART AT BEDSIDE 

## 2017-02-17 NOTE — ED Provider Notes (Signed)
[facility] Emergency Department Provider Note  ____________________________________________   I have reviewed the triage vital signs and the nursing notes.   HISTORY  Chief Complaint Abdominal Pain and Vaginal Bleeding   History limited by: Not Limited   HPI Kristy Reid is a 21 y.o. female who presents to the emergency department today because of concern for abdominal cramping and vaginal bleeding.   LOCATION:lower abdomen DURATION:2 days TIMING: waxing and waning SEVERITY: severe QUALITY: cramping CONTEXT: patient states she hasn't had a period since roughly February after having a pregnancy.  MODIFYING FACTORS: tried over the counter medication without relief ASSOCIATED SYMPTOMS: has had some nausea  Per medical record review patient has a history of anemia.  Past Medical History:  Diagnosis Date  . Anemia   . Anemia   . Anxiety   . Asthma   . Bipolar 1 disorder (HCC)   . Chronic back pain   . Depression   . Insomnia   . Personality disorder (HCC)    "boarderline"  . PTSD (post-traumatic stress disorder)     Patient Active Problem List   Diagnosis Date Noted  . Bipolar 2 disorder (HCC) 11/11/2016  . Noncompliance 11/11/2016  . Severe recurrent major depression with psychotic features (HCC) 07/18/2016  . Depression, major, recurrent, severe with psychosis (HCC) 07/17/2016  . Normal labor 05/07/2016  . Labor and delivery, indication for care 04/19/2016  . Indication for care in labor and delivery, antepartum 04/06/2016  . Decreased fetal movement 03/22/2016  . Abdominal pain in pregnancy 01/21/2016  . Constipation during pregnancy in second trimester 12/26/2015  . Overdose 11/29/2015  . Pregnancy (I trimester) 11/15/2015  . Cannabis use disorder, severe, dependence (HCC) 11/15/2015  . Tobacco use disorder 11/15/2015  . Asthma 11/15/2015  . Borderline personality disorder (HCC) 11/15/2015  . PTSD (post-traumatic stress  disorder) 11/15/2015  . Severe recurrent major depression without psychotic features (HCC) 11/13/2015    Past Surgical History:  Procedure Laterality Date  . BUNIONECTOMY Bilateral    feet  . wrist sugery      Prior to Admission medications   Medication Sig Start Date End Date Taking? Authorizing Provider  albuterol (PROVENTIL HFA;VENTOLIN HFA) 108 (90 Base) MCG/ACT inhaler Inhale 1-2 puffs into the lungs every 6 (six) hours as needed for wheezing or shortness of breath. Patient not taking: Reported on 02/17/2017 07/21/16   Pucilowska, Braulio ConteJolanta B, MD  brompheniramine-pseudoephedrine-DM 30-2-10 MG/5ML syrup Take 5 mLs 4 (four) times daily as needed by mouth. Patient not taking: Reported on 02/17/2017 01/23/17   Kem Boroughsriplett, Cari B, FNP  dicyclomine (BENTYL) 20 MG tablet Take 1 tablet (20 mg total) 3 (three) times daily as needed by mouth for spasms. Patient not taking: Reported on 02/17/2017 01/23/17 01/23/18  Chinita Pesterriplett, Cari B, FNP  loratadine (CLARITIN) 10 MG tablet Take 1 tablet (10 mg total) by mouth daily. Patient not taking: Reported on 02/17/2017 07/22/16   Pucilowska, Braulio ConteJolanta B, MD  QUEtiapine (SEROQUEL) 300 MG tablet Take 1 tablet (300 mg total) by mouth at bedtime. Patient not taking: Reported on 02/17/2017 11/11/16   [name], Jackquline DenmarkJohn T, MD  sertraline (ZOLOFT) 100 MG tablet Take 1 tablet (100 mg total) by mouth daily. Patient not taking: Reported on 02/17/2017 11/11/16   [name], Jackquline DenmarkJohn T, MD  traZODone (DESYREL) 150 MG tablet Take 1 tablet (150 mg total) by mouth at bedtime as needed for sleep. Patient not taking: Reported on 02/17/2017 11/11/16   [name], Jackquline DenmarkJohn T, MD    Allergies Penicillins;  Rocephin [ceftriaxone]; Zithromax [azithromycin]; and Lidocaine  Family History  Problem Relation Age of Onset  . Diabetes Mother   . Diabetes Maternal Grandmother   . Heart disease Maternal Grandmother   . Cancer Neg Hx   . Ovarian cancer Neg Hx     Social History Social History   Tobacco Use  .  Smoking status: Former Smoker    Types: Cigarettes    Last attempt to quit: 08/13/2015    Years since quitting: 1.5  . Smokeless tobacco: Never Used  Substance Use Topics  . Alcohol use: No  . Drug use: Yes    Frequency: 1.0 times per week    Types: Marijuana    Comment: Last use 10/21/16    Review of Systems Constitutional: No fever/chills Eyes: No visual changes. ENT: No sore throat. Cardiovascular: Denies chest pain. Respiratory: Denies shortness of breath. Gastrointestinal: positive for abdominal cramping. Genitourinary: positive for vaginal bleeding. Musculoskeletal: Negative for back pain. Skin: Negative for rash. Neurological: Negative for headaches, focal weakness or numbness.  ____________________________________________   PHYSICAL EXAM:  VITAL SIGNS: ED Triage Vitals [02/17/17 1001]  Enc Vitals Group     BP 132/79     Pulse Rate 97     Resp 16     Temp 97.8 F (36.6 C)     Temp Source Oral     SpO2 100 %     Weight 185 lb (83.9 kg)     Height 5\' 4"  (1.626 m)     Head Circumference      Peak Flow      Pain Score 8    Constitutional: Alert and oriented. Well appearing and in no distress. Eyes: Conjunctivae are normal.  ENT   Head: Normocephalic and atraumatic.   Nose: No congestion/rhinnorhea.   Mouth/Throat: Mucous membranes are moist.   Neck: No stridor. Hematological/Lymphatic/Immunilogical: No cervical lymphadenopathy. Cardiovascular: Normal rate, regular rhythm.  No murmurs, rubs, or gallops.  Respiratory: Normal respiratory effort without tachypnea nor retractions. Breath sounds are clear and equal bilaterally. No wheezes/rales/rhonchi. Gastrointestinal: Soft and non tender. No rebound. No guarding.  Genitourinary: Deferred Musculoskeletal: Normal range of motion in all extremities. No lower extremity edema. Neurologic:  Normal speech and language. No gross focal neurologic deficits are appreciated.  Skin:  Skin is warm, dry and  intact. No rash noted. Psychiatric: Mood and affect are normal. Speech and behavior are normal. Patient exhibits appropriate insight and judgment.  ____________________________________________    LABS (pertinent positives/negatives)  CBC wnl except RDW Upreg negative CMP wnl except ALT Lipase 28 ____________________________________________   EKG  None  ____________________________________________    RADIOLOGY  None  ____________________________________________   PROCEDURES  Procedures  ____________________________________________   INITIAL IMPRESSION / ASSESSMENT AND PLAN / ED COURSE  Pertinent labs & imaging results that were available during my care of the patient were reviewed by me and considered in my medical decision making (see chart for details).  Patient presented to the emergency department today because of concern for abdominal cramping and vaginal bleeding. ddx would be broad including kidney stones, UTI, appendicitis, menstrual cramping amongst other etiologies. Work up without concerning findings. Think likely menstrual cramping. Discussed importance of PCP follow up.   ____________________________________________   FINAL CLINICAL IMPRESSION(S) / ED DIAGNOSES  Final diagnoses:  Menstrual cramps     Note: This dictation was prepared with Dragon dictation. Any transcriptional errors that result from this process are unintentional     Phineas SemenGoodman, Omarrion Carmer, MD 02/17/17 1336

## 2017-03-03 ENCOUNTER — Telehealth: Payer: Self-pay | Admitting: Obstetrics & Gynecology

## 2017-03-03 NOTE — Telephone Encounter (Signed)
[facility] referring for Dysmenorrhea. Unable to reach patient due to phone disconnected

## 2017-03-10 NOTE — Telephone Encounter (Signed)
Unable to reach patient due to phone disconnected.

## 2017-03-11 NOTE — Telephone Encounter (Signed)
Unable to reach patient due to phone disconnected. [place] [facility] to notifiy them of unable being able to reach patient .

## 2017-08-02 ENCOUNTER — Other Ambulatory Visit: Payer: Self-pay

## 2017-08-02 ENCOUNTER — Encounter: Payer: Self-pay | Admitting: Emergency Medicine

## 2017-08-02 ENCOUNTER — Emergency Department
Admission: EM | Admit: 2017-08-02 | Discharge: 2017-08-02 | Disposition: A | Discharge status: Home / Self Care | Payer: Medicaid Other | Attending: Emergency Medicine | Admitting: Emergency Medicine

## 2017-08-02 DIAGNOSIS — R2232 Localized swelling, mass and lump, left upper limb: Secondary | ICD-10-CM | POA: Diagnosis present

## 2017-08-02 DIAGNOSIS — L02412 Cutaneous abscess of left axilla: Secondary | ICD-10-CM

## 2017-08-02 DIAGNOSIS — J45909 Unspecified asthma, uncomplicated: Secondary | ICD-10-CM | POA: Diagnosis not present

## 2017-08-02 DIAGNOSIS — Z87891 Personal history of nicotine dependence: Secondary | ICD-10-CM | POA: Diagnosis not present

## 2017-08-02 DIAGNOSIS — F419 Anxiety disorder, unspecified: Secondary | ICD-10-CM | POA: Insufficient documentation

## 2017-08-02 DIAGNOSIS — F319 Bipolar disorder, unspecified: Secondary | ICD-10-CM | POA: Diagnosis not present

## 2017-08-02 MED ORDER — NAPROXEN 500 MG PO TABS: 500.0000 mg | ORAL_TABLET | Freq: Two times a day (BID) | ORAL | Status: DC

## 2017-08-02 MED ORDER — OXYCODONE-ACETAMINOPHEN 5-325 MG PO TABS
1.0000 | ORAL_TABLET | Freq: Once | ORAL | Status: AC
  Administered 2017-08-02: 1 via ORAL
  Filled 2017-08-02: qty 1

## 2017-08-02 MED ORDER — SULFAMETHOXAZOLE-TRIMETHOPRIM 800-160 MG PO TABS
1.0000 | ORAL_TABLET | Freq: Once | ORAL | Status: AC
  Administered 2017-08-02: 1 via ORAL
  Filled 2017-08-02: qty 1

## 2017-08-02 MED ORDER — OXYCODONE-ACETAMINOPHEN 7.5-325 MG PO TABS: 1.0000 | ORAL_TABLET | Freq: Four times a day (QID) | ORAL | 0 refills | Status: DC | PRN

## 2017-08-02 MED ORDER — SULFAMETHOXAZOLE-TRIMETHOPRIM 800-160 MG PO TABS: 1.0000 | ORAL_TABLET | Freq: Two times a day (BID) | ORAL | 0 refills | Status: DC

## 2017-08-02 MED ORDER — NAPROXEN 500 MG PO TABS
500.0000 mg | ORAL_TABLET | Freq: Once | ORAL | Status: AC
  Administered 2017-08-02: 500 mg via ORAL
  Filled 2017-08-02: qty 1

## 2017-08-02 NOTE — ED Notes (Signed)
See triage note  Presents with possible abscess under left arm  Having increased pain this am

## 2017-08-02 NOTE — Discharge Instructions (Addendum)
Follow discharge care instruction take medication as directed. °

## 2017-08-02 NOTE — ED Triage Notes (Signed)
Abscess L axilla x 3 days.

## 2017-08-02 NOTE — ED Provider Notes (Signed)
Medical screening examination/treatment/procedure(s) were performed by non-physician practitioner and as supervising physician I was immediately available for consultation/collaboration.      Myrna Blazer, MD 08/02/17 [phone]

## 2017-08-02 NOTE — ED Provider Notes (Signed)
[facility] Emergency Department Provider Note   ____________________________________________   First MD Initiated Contact with Patient 08/02/17 1404     (approximate)  I have reviewed the triage vital signs and the nursing notes.   HISTORY  Chief Complaint Abscess    HPI Kristy Reid is a 22 y.o. female patient presents with pain and mild swelling left axillary area for 3 days.  Patient denies drainage.  Patient rates the pain as 8/10.  Patient described the pain is "achy".  Pain increases with abduction overhead reaching.  No palliative measures for complaint.  Past Medical History:  Diagnosis Date  . Anemia   . Anemia   . Anxiety   . Asthma   . Bipolar 1 disorder (HCC)   . Chronic back pain   . Depression   . Insomnia   . Personality disorder (HCC)    "boarderline"  . PTSD (post-traumatic stress disorder)     Patient Active Problem List   Diagnosis Date Noted  . Bipolar 2 disorder (HCC) 11/11/2016  . Noncompliance 11/11/2016  . Severe recurrent major depression with psychotic features (HCC) 07/18/2016  . Depression, major, recurrent, severe with psychosis (HCC) 07/17/2016  . Normal labor 05/07/2016  . Labor and delivery, indication for care 04/19/2016  . Indication for care in labor and delivery, antepartum 04/06/2016  . Decreased fetal movement 03/22/2016  . Abdominal pain in pregnancy 01/21/2016  . Constipation during pregnancy in second trimester 12/26/2015  . Overdose 11/29/2015  . Pregnancy (I trimester) 11/15/2015  . Cannabis use disorder, severe, dependence (HCC) 11/15/2015  . Tobacco use disorder 11/15/2015  . Asthma 11/15/2015  . Borderline personality disorder (HCC) 11/15/2015  . PTSD (post-traumatic stress disorder) 11/15/2015  . Severe recurrent major depression without psychotic features (HCC) 11/13/2015    Past Surgical History:  Procedure Laterality Date  . BUNIONECTOMY Bilateral    feet  . wrist sugery       Prior to Admission medications   Medication Sig Start Date End Date Taking? Authorizing Provider  albuterol (PROVENTIL HFA;VENTOLIN HFA) 108 (90 Base) MCG/ACT inhaler Inhale 1-2 puffs into the lungs every 6 (six) hours as needed for wheezing or shortness of breath. Patient not taking: Reported on 02/17/2017 07/21/16   Pucilowska, Braulio Conte B, MD  brompheniramine-pseudoephedrine-DM 30-2-10 MG/5ML syrup Take 5 mLs 4 (four) times daily as needed by mouth. Patient not taking: Reported on 02/17/2017 01/23/17   Kem Boroughs B, FNP  dicyclomine (BENTYL) 20 MG tablet Take 1 tablet (20 mg total) 3 (three) times daily as needed by mouth for spasms. Patient not taking: Reported on 02/17/2017 01/23/17 01/23/18  Chinita Pester, FNP  loratadine (CLARITIN) 10 MG tablet Take 1 tablet (10 mg total) by mouth daily. Patient not taking: Reported on 02/17/2017 07/22/16   Pucilowska, Ellin Goodie, MD  naproxen (NAPROSYN) 500 MG tablet Take 1 tablet (500 mg total) by mouth 2 (two) times daily with a meal. 08/02/17   Joni Reining, PA-C  oxyCODONE-acetaminophen (PERCOCET) 7.5-325 MG tablet Take 1 tablet by mouth every 6 (six) hours as needed for severe pain. 08/02/17   Joni Reining, PA-C  QUEtiapine (SEROQUEL) 300 MG tablet Take 1 tablet (300 mg total) by mouth at bedtime. Patient not taking: Reported on 02/17/2017 11/11/16   [name], [name], MD  sertraline (ZOLOFT) 100 MG tablet Take 1 tablet (100 mg total) by mouth daily. Patient not taking: Reported on 02/17/2017 11/11/16   [name], [name], MD  sulfamethoxazole-trimethoprim (BACTRIM DS,SEPTRA DS)  800-160 MG tablet Take 1 tablet by mouth 2 (two) times daily. 08/02/17   Joni Reining, PA-C  traZODone (DESYREL) 150 MG tablet Take 1 tablet (150 mg total) by mouth at bedtime as needed for sleep. Patient not taking: Reported on 02/17/2017 11/11/16   [name], [name], MD    Allergies Penicillins; Rocephin [ceftriaxone]; Zithromax [azithromycin]; and Lidocaine  Family History   Problem Relation Age of Onset  . Diabetes Mother   . Diabetes Maternal Grandmother   . Heart disease Maternal Grandmother   . Cancer Neg Hx   . Ovarian cancer Neg Hx     Social History Social History   Tobacco Use  . Smoking status: Former Smoker    Types: Cigarettes    Last attempt to quit: 08/13/2015    Years since quitting: 1.9  . Smokeless tobacco: Never Used  Substance Use Topics  . Alcohol use: No  . Drug use: Yes    Frequency: 1.0 times per week    Types: Marijuana    Comment: Last use 10/21/16    Review of Systems  Constitutional: No fever/chills Eyes: No visual changes. ENT: No sore throat. Cardiovascular: Denies chest pain. Respiratory: Denies shortness of breath. Gastrointestinal: No abdominal pain.  No nausea, no vomiting.  No diarrhea.  No constipation. Genitourinary: Negative for dysuria. Musculoskeletal: Negative for back pain. Skin: Negative for rash. Neurological: Negative for headaches, focal weakness or numbness. Psychiatric:Anxiety, bipolar, depression, and PTSD. Allergic/Immunilogical: See medication list. ____________________________________________   PHYSICAL EXAM:  VITAL SIGNS: ED Triage Vitals  Enc Vitals Group     BP 08/02/17 1319 128/76     Pulse Rate 08/02/17 1319 (!) 105     Resp 08/02/17 1319 (!) 22     Temp 08/02/17 1319 98.2 F (36.8 C)     Temp Source 08/02/17 1319 Oral     SpO2 08/02/17 1319 100 %     Weight 08/02/17 1320 195 lb (88.5 kg)     Height 08/02/17 1320  (1.575 m)     Head Circumference --      Peak Flow --      Pain Score 08/02/17 1320 8     Pain Loc --      Pain Edu? --      Excl. in GC? --    Constitutional: Alert and oriented. Well appearing and in no acute distress. Cardiovascular: Normal rate, regular rhythm. Grossly normal heart sounds.  Good peripheral circulation. Respiratory: Normal respiratory effort.  No retractions. Lungs CTAB. Neurologic:  Normal speech and language. No gross focal  neurologic deficits are appreciated. No gait instability. Skin:  Skin is warm, dry and intact. No rash noted.  Nonerythematous nonfluctuant nodule lesion left axillary area.  Moderate guarding palpation. Psychiatric: Mood and affect are normal. Speech and behavior are normal.  ____________________________________________   LABS (all labs ordered are listed, but only abnormal results are displayed)  Labs Reviewed - No data to display ____________________________________________  EKG   ____________________________________________  RADIOLOGY  ED MD interpretation:    Official radiology report(s): No results found.  ____________________________________________   PROCEDURES  Procedure(s) performed: None  Procedures  Critical Care performed: No  ____________________________________________   INITIAL IMPRESSION / ASSESSMENT AND PLAN / ED COURSE  As part of my medical decision making, I reviewed the following data within the electronic MEDICAL RECORD NUMBER    Left axillary pain secondary to early abscess.  Discussed with patient rationale for not incised and drained at this time.  Conservative  therapy with discharge care instructions.  Take medication as directed.  Return to ED if condition worsens.      ____________________________________________   FINAL CLINICAL IMPRESSION(S) / ED DIAGNOSES  Final diagnoses:  Abscess of left axilla     ED Discharge Orders        Ordered    sulfamethoxazole-trimethoprim (BACTRIM DS,SEPTRA DS) 800-160 MG tablet  2 times daily     08/02/17 1410    oxyCODONE-acetaminophen (PERCOCET) 7.5-325 MG tablet  Every 6 hours PRN     08/02/17 1410    naproxen (NAPROSYN) 500 MG tablet  2 times daily with meals     08/02/17 1410       Note:  This document was prepared using Dragon voice recognition software and may include unintentional dictation errors.    Joni Reining, PA-C 08/02/17 1415    Schaevitz, Myra Rude,  MD 08/02/17 [phone]

## 2017-08-20 ENCOUNTER — Other Ambulatory Visit: Payer: Self-pay

## 2017-08-20 ENCOUNTER — Emergency Department
Admission: EM | Admit: 2017-08-20 | Discharge: 2017-08-20 | Disposition: A | Discharge status: Home / Self Care | Payer: Medicaid Other | Attending: Emergency Medicine | Admitting: Emergency Medicine

## 2017-08-20 ENCOUNTER — Emergency Department: Payer: Medicaid Other

## 2017-08-20 ENCOUNTER — Encounter: Payer: Self-pay | Admitting: Emergency Medicine

## 2017-08-20 DIAGNOSIS — R55 Syncope and collapse: Secondary | ICD-10-CM | POA: Diagnosis not present

## 2017-08-20 DIAGNOSIS — R0789 Other chest pain: Secondary | ICD-10-CM | POA: Diagnosis not present

## 2017-08-20 DIAGNOSIS — J45909 Unspecified asthma, uncomplicated: Secondary | ICD-10-CM | POA: Insufficient documentation

## 2017-08-20 DIAGNOSIS — Z87891 Personal history of nicotine dependence: Secondary | ICD-10-CM | POA: Insufficient documentation

## 2017-08-20 DIAGNOSIS — R079 Chest pain, unspecified: Secondary | ICD-10-CM | POA: Diagnosis present

## 2017-08-20 LAB — TROPONIN I: Troponin I: <0.03 ng/mL (ref <0.03)

## 2017-08-20 LAB — BASIC METABOLIC PANEL
Anion gap: 6 (ref 5–15)
BUN: 15 mg/dL (ref 6–20)
CO2: 23 mmol/L (ref 22–32)
CREATININE: 0.88 mg/dL (ref 0.44–1.00)
Calcium: 9.2 mg/dL (ref 8.9–10.3)
Chloride: 107 mmol/L (ref 101–111)
GFR calc Af Amer: >60 mL/min (ref >60)
GFR calc non Af Amer: >60 mL/min (ref >60)
Glucose, Bld: 84 mg/dL (ref 65–99)
POTASSIUM: 3.9 mmol/L (ref 3.5–5.1)
SODIUM: 136 mmol/L (ref 135–145)

## 2017-08-20 LAB — CBC
HEMATOCRIT: 32.5 % — AB (ref 35.0–47.0)
Hemoglobin: 10.4 g/dL — ABNORMAL LOW (ref 12.0–16.0)
MCH: 23.7 pg — AB (ref 26.0–34.0)
MCHC: 31.9 g/dL — ABNORMAL LOW (ref 32.0–36.0)
MCV: 74.3 fL — ABNORMAL LOW (ref 80.0–100.0)
Platelets: 326 10*3/uL (ref 150–440)
RBC: 4.38 MIL/uL (ref 3.80–5.20)
RDW: 19.2 % — AB (ref 11.5–14.5)
WBC: 6.8 10*3/uL (ref 3.6–11.0)

## 2017-08-20 LAB — POC URINE PREG, ED: Preg Test, Ur: NEGATIVE

## 2017-08-20 MED ORDER — FAMOTIDINE 20 MG PO TABS: 20.0000 mg | ORAL_TABLET | Freq: Two times a day (BID) | ORAL | 0 refills | Status: DC

## 2017-08-20 NOTE — ED Provider Notes (Signed)
[facility] Emergency Department Provider Note  ____________________________________________  Time seen: Approximately 5:10 PM  I have reviewed the triage vital signs and the nursing notes.   HISTORY  Chief Complaint Chest Pain and Loss of Consciousness    HPI Kristy Reid is a 22 y.o. female with a history of anemia, anxiety, bipolar who reports onset of central chest pain radiating to the left side of the chest associated numbness in both hands causing her to get dizzy and passed out today. Not exertional, not pleuritic. No shortness of breath vomiting or diaphoresis. No trauma. No fevers chills or sweats. Eating and drinking normally.no aggravating or alleviating factors. Chest pain has been intermittent at random for the past 2-3 days.  She reports a history of similar symptoms of chest pain and dizziness in the past.   she missed her last period  Past Medical History:  Diagnosis Date  . Anemia   . Anemia   . Anxiety   . Asthma   . Bipolar 1 disorder (HCC)   . Chronic back pain   . Depression   . Insomnia   . Personality disorder (HCC)    "boarderline"  . PTSD (post-traumatic stress disorder)      Patient Active Problem List   Diagnosis Date Noted  . Bipolar 2 disorder (HCC) 11/11/2016  . Noncompliance 11/11/2016  . Severe recurrent major depression with psychotic features (HCC) 07/18/2016  . Depression, major, recurrent, severe with psychosis (HCC) 07/17/2016  . Normal labor 05/07/2016  . Labor and delivery, indication for care 04/19/2016  . Indication for care in labor and delivery, antepartum 04/06/2016  . Decreased fetal movement 03/22/2016  . Abdominal pain in pregnancy 01/21/2016  . Constipation during pregnancy in second trimester 12/26/2015  . Overdose 11/29/2015  . Pregnancy (I trimester) 11/15/2015  . Cannabis use disorder, severe, dependence (HCC) 11/15/2015  . Tobacco use disorder 11/15/2015  . Asthma 11/15/2015  .  Borderline personality disorder (HCC) 11/15/2015  . PTSD (post-traumatic stress disorder) 11/15/2015  . Severe recurrent major depression without psychotic features (HCC) 11/13/2015     Past Surgical History:  Procedure Laterality Date  . BUNIONECTOMY Bilateral    feet  . wrist sugery       Prior to Admission medications   Medication Sig Start Date End Date Taking? Authorizing Provider  albuterol (PROVENTIL HFA;VENTOLIN HFA) 108 (90 Base) MCG/ACT inhaler Inhale 1-2 puffs into the lungs every 6 (six) hours as needed for wheezing or shortness of breath. Patient not taking: Reported on 02/17/2017 07/21/16   Pucilowska, Braulio Conte B, MD  brompheniramine-pseudoephedrine-DM 30-2-10 MG/5ML syrup Take 5 mLs 4 (four) times daily as needed by mouth. Patient not taking: Reported on 02/17/2017 01/23/17   Kem Boroughs B, FNP  dicyclomine (BENTYL) 20 MG tablet Take 1 tablet (20 mg total) 3 (three) times daily as needed by mouth for spasms. Patient not taking: Reported on 02/17/2017 01/23/17 01/23/18  Chinita Pester, FNP  loratadine (CLARITIN) 10 MG tablet Take 1 tablet (10 mg total) by mouth daily. Patient not taking: Reported on 02/17/2017 07/22/16   Pucilowska, Ellin Goodie, MD  naproxen (NAPROSYN) 500 MG tablet Take 1 tablet (500 mg total) by mouth 2 (two) times daily with a meal. 08/02/17   Joni Reining, PA-C  oxyCODONE-acetaminophen (PERCOCET) 7.5-325 MG tablet Take 1 tablet by mouth every 6 (six) hours as needed for severe pain. 08/02/17   Joni Reining, PA-C  QUEtiapine (SEROQUEL) 300 MG tablet Take 1 tablet (300 mg total)  by mouth at bedtime. Patient not taking: Reported on 02/17/2017 11/11/16   [name], [name], MD  sertraline (ZOLOFT) 100 MG tablet Take 1 tablet (100 mg total) by mouth daily. Patient not taking: Reported on 02/17/2017 11/11/16   [name], [name], MD  sulfamethoxazole-trimethoprim (BACTRIM DS,SEPTRA DS) 800-160 MG tablet Take 1 tablet by mouth 2 (two) times daily. 08/02/17   Joni Reining, PA-C  traZODone (DESYREL) 150 MG tablet Take 1 tablet (150 mg total) by mouth at bedtime as needed for sleep. Patient not taking: Reported on 02/17/2017 11/11/16   [name], [name], MD     Allergies Penicillins; Rocephin [ceftriaxone]; Zithromax [azithromycin]; and Lidocaine   Family History  Problem Relation Age of Onset  . Diabetes Mother   . Diabetes Maternal Grandmother   . Heart disease Maternal Grandmother   . Cancer Neg Hx   . Ovarian cancer Neg Hx     Social History Social History   Tobacco Use  . Smoking status: Former Smoker    Types: Cigarettes    Last attempt to quit: 08/13/2015    Years since quitting: 2.0  . Smokeless tobacco: Never Used  Substance Use Topics  . Alcohol use: No  . Drug use: Yes    Frequency: 1.0 times per week    Types: Marijuana    Comment: Last use 10/21/16    Review of Systems  Constitutional:   No fever or chills.  ENT:   No sore throat. No rhinorrhea. Cardiovascular:  positive as above chest pain and syncope. Respiratory:   No dyspnea or cough. Gastrointestinal:   Negative for abdominal pain, vomiting and diarrhea.  Musculoskeletal:   Negative for focal pain or swelling All other systems reviewed and are negative except as documented above in ROS and HPI.  ____________________________________________   PHYSICAL EXAM:  VITAL SIGNS: ED Triage Vitals  Enc Vitals Group     BP 08/20/17 1258 126/72     Pulse Rate 08/20/17 1258 100     Resp 08/20/17 1258 15     Temp 08/20/17 1258 97.6 F (36.4 C)     Temp Source 08/20/17 1258 Oral     SpO2 08/20/17 1258 100 %     Weight 08/20/17 1300 195 lb (88.5 kg)     Height 08/20/17 1300 5\' 2"  (1.575 m)     Head Circumference --      Peak Flow --      Pain Score 08/20/17 1314 7     Pain Loc --      Pain Edu? --      Excl. in GC? --     Vital signs reviewed, nursing assessments reviewed.   Constitutional:   Alert and oriented. nontoxic-appearing Eyes:   Conjunctivae are normal.  EOMI. PERRL. ENT      Head:   Normocephalic and atraumatic.      Nose:   No congestion/rhinnorhea.       Mouth/Throat:   MMM, no pharyngeal erythema. No peritonsillar mass.       Neck:   No meningismus. Full ROM. Hematological/Lymphatic/Immunilogical:   No cervical lymphadenopathy. Cardiovascular:   RRR. Symmetric bilateral radial and DP pulses.  No murmurs.  Respiratory:   Normal respiratory effort without tachypnea/retractions. Breath sounds are clear and equal bilaterally. No wheezes/rales/rhonchi. Gastrointestinal:   Soft with left upper quadrant tenderness. Non distended. There is no CVA tenderness.  No rebound, rigidity, or guarding. Genitourinary:   deferred Musculoskeletal:   Normal range of motion in all extremities.  No joint effusions.  No lower extremity tenderness.  No edema. Neurologic:   Normal speech and language.  Motor grossly intact. No acute focal neurologic deficits are appreciated.  Skin:    Skin is warm, dry and intact. No rash noted.  No petechiae, purpura, or bullae.  ____________________________________________    LABS (pertinent positives/negatives) (all labs ordered are listed, but only abnormal results are displayed) Labs Reviewed  CBC - Abnormal; Notable for the following components:      Result Value   Hemoglobin 10.4 (*)    HCT 32.5 (*)    MCV 74.3 (*)    MCH 23.7 (*)    MCHC 31.9 (*)    RDW 19.2 (*)    All other components within normal limits  BASIC METABOLIC PANEL  TROPONIN I  POC URINE PREG, ED   ____________________________________________   EKG  interpreted by me Sinus tachycardia rate 109, normal axis intervals QRS ST segments and T waves  ____________________________________________    RADIOLOGY  Dg Chest 2 View  Result Date: 08/20/2017 CLINICAL DATA:  Shortness of breath. Left hand numbness and intermittent chest pain for 3 days. EXAM: CHEST - 2 VIEW COMPARISON:  Chest radiograph October 23, 2016 FINDINGS: Cardiomediastinal  silhouette is normal. No pleural effusions or focal consolidations. Trachea projects midline and there is no pneumothorax. Soft tissue planes and included osseous structures are non-suspicious. IMPRESSION: Negative. Electronically Signed   By: Awilda Metroourtnay  Bloomer M.D.   On: 08/20/2017 13:36    ____________________________________________   PROCEDURES Procedures  ____________________________________________  DIFFERENTIAL DIAGNOSIS   GERD, they'll episode, dehydration. Low suspicion ACS PE dissection AAA ectopic Or sepsis.  CLINICAL IMPRESSION / ASSESSMENT AND PLAN / ED COURSE  Pertinent labs & imaging results that were available during my care of the patient were reviewed by me and considered in my medical decision making (see chart for details).      Clinical Course as of Aug 20 1708  Fri Aug 20, 2017  1504 Atypical chest pain, syncope today.  Has a history of chest pain and dizziness.  Not exertional. Missed last period. Labs unremarkable, ekg shows sinus tachy without ischemic changes.  Does have chronic anemia.    [PS]  1707 Labs, preg negative. Suspect dehydration. Considering the patient's symptoms, medical history, and physical examination today, I have low suspicion for ACS, PE, TAD, pneumothorax, carditis, mediastinitis, pneumonia, CHF, or sepsis.     [PS]    Clinical Course User Index [PS] Sharman CheekStafford, Tenise Stetler, MD     ____________________________________________   FINAL CLINICAL IMPRESSION(S) / ED DIAGNOSES    Final diagnoses:  Atypical chest pain  Syncope, unspecified syncope type     ED Discharge Orders    None      Portions of this note were generated with dragon dictation software. Dictation errors may occur despite best attempts at proofreading.    Sharman CheekStafford, Angle Karel, MD 08/20/17 [phone]

## 2017-08-20 NOTE — Discharge Instructions (Addendum)
Your blood tests today were normal, and your pregnancy test is also negative.  Your tests do show that you are still anemic. You should buy an over-the counter iron supplement and begin taking it once a day.  Follow up with cardiology for further evaluation of your symptoms.

## 2017-08-20 NOTE — ED Triage Notes (Signed)
Pt presents to ED via POV with c/o substernal chest pain that radiates to the L chest. Pt c/o numbness to her both hands at this time. Pt c/o syncopal episode today lasting approx 3-5 minutes. Pt is alert and oriented at this time.

## 2017-09-08 ENCOUNTER — Emergency Department: Payer: Medicaid Other

## 2017-09-08 ENCOUNTER — Other Ambulatory Visit: Payer: Self-pay

## 2017-09-08 ENCOUNTER — Encounter: Payer: Self-pay | Admitting: Emergency Medicine

## 2017-09-08 ENCOUNTER — Emergency Department
Admission: EM | Admit: 2017-09-08 | Discharge: 2017-09-08 | Disposition: A | Discharge status: Home / Self Care | Payer: Medicaid Other | Attending: Emergency Medicine | Admitting: Emergency Medicine

## 2017-09-08 DIAGNOSIS — Y939 Activity, unspecified: Secondary | ICD-10-CM | POA: Insufficient documentation

## 2017-09-08 DIAGNOSIS — Y999 Unspecified external cause status: Secondary | ICD-10-CM | POA: Insufficient documentation

## 2017-09-08 DIAGNOSIS — X58XXXA Exposure to other specified factors, initial encounter: Secondary | ICD-10-CM | POA: Diagnosis not present

## 2017-09-08 DIAGNOSIS — Y929 Unspecified place or not applicable: Secondary | ICD-10-CM | POA: Diagnosis not present

## 2017-09-08 DIAGNOSIS — M25511 Pain in right shoulder: Secondary | ICD-10-CM | POA: Diagnosis not present

## 2017-09-08 DIAGNOSIS — S199XXA Unspecified injury of neck, initial encounter: Secondary | ICD-10-CM | POA: Diagnosis present

## 2017-09-08 DIAGNOSIS — S161XXA Strain of muscle, fascia and tendon at neck level, initial encounter: Secondary | ICD-10-CM | POA: Diagnosis not present

## 2017-09-08 MED ORDER — IBUPROFEN 600 MG PO TABS: 600.0000 mg | ORAL_TABLET | Freq: Three times a day (TID) | ORAL | 0 refills | Status: DC | PRN

## 2017-09-08 MED ORDER — CYCLOBENZAPRINE HCL 10 MG PO TABS: 10.0000 mg | ORAL_TABLET | Freq: Three times a day (TID) | ORAL | 0 refills | Status: DC | PRN

## 2017-09-08 NOTE — ED Triage Notes (Signed)
Pt states she felt her right shoulder "pop" the other day and has been hurting ever since.

## 2017-09-08 NOTE — ED Provider Notes (Signed)
[facility] Emergency Department Provider Note   ____________________________________________   First MD Initiated Contact with Patient 09/08/17 [phone]     (approximate)  I have reviewed the triage vital signs and the nursing notes.   HISTORY  Chief Complaint Shoulder Pain    HPI Kristy Reid is a 22 y.o. female patient complain of neck and right shoulder pain secondary to overhead recent incident 2 days ago.  Patient states she felt a "pop" while at work 2 days ago.  Patient also states increased neck pain with flexion and left lateral movements.  Patient rates pain as a 8/10.  Patient described the pain is "aching".  No palliative measure for complaint.  Past Medical History:  Diagnosis Date  . Anemia   . Anemia   . Anxiety   . Asthma   . Bipolar 1 disorder (HCC)   . Chronic back pain   . Depression   . Insomnia   . Personality disorder (HCC)    "boarderline"  . PTSD (post-traumatic stress disorder)     Patient Active Problem List   Diagnosis Date Noted  . Bipolar 2 disorder (HCC) 11/11/2016  . Noncompliance 11/11/2016  . Severe recurrent major depression with psychotic features (HCC) 07/18/2016  . Depression, major, recurrent, severe with psychosis (HCC) 07/17/2016  . Normal labor 05/07/2016  . Labor and delivery, indication for care 04/19/2016  . Indication for care in labor and delivery, antepartum 04/06/2016  . Decreased fetal movement 03/22/2016  . Abdominal pain in pregnancy 01/21/2016  . Constipation during pregnancy in second trimester 12/26/2015  . Overdose 11/29/2015  . Pregnancy (I trimester) 11/15/2015  . Cannabis use disorder, severe, dependence (HCC) 11/15/2015  . Tobacco use disorder 11/15/2015  . Asthma 11/15/2015  . Borderline personality disorder (HCC) 11/15/2015  . PTSD (post-traumatic stress disorder) 11/15/2015  . Severe recurrent major depression without psychotic features (HCC) 11/13/2015    Past Surgical  History:  Procedure Laterality Date  . BUNIONECTOMY Bilateral    feet  . wrist sugery      Prior to Admission medications   Medication Sig Start Date End Date Taking? Authorizing Provider  albuterol (PROVENTIL HFA;VENTOLIN HFA) 108 (90 Base) MCG/ACT inhaler Inhale 1-2 puffs into the lungs every 6 (six) hours as needed for wheezing or shortness of breath. Patient not taking: Reported on 02/17/2017 07/21/16   Pucilowska, Braulio ConteJolanta B, MD  brompheniramine-pseudoephedrine-DM 30-2-10 MG/5ML syrup Take 5 mLs 4 (four) times daily as needed by mouth. Patient not taking: Reported on 02/17/2017 01/23/17   Kem Boroughsriplett, Cari B, FNP  cyclobenzaprine (FLEXERIL) 10 MG tablet Take 1 tablet (10 mg total) by mouth 3 (three) times daily as needed. 09/08/17   Joni ReiningSmith,  K, PA-C  dicyclomine (BENTYL) 20 MG tablet Take 1 tablet (20 mg total) 3 (three) times daily as needed by mouth for spasms. Patient not taking: Reported on 02/17/2017 01/23/17 01/23/18  Chinita Pesterriplett, Cari B, FNP  famotidine (PEPCID) 20 MG tablet Take 1 tablet (20 mg total) by mouth 2 (two) times daily. 08/20/17   Sharman CheekStafford, Phillip, MD  ibuprofen (ADVIL,MOTRIN) 600 MG tablet Take 1 tablet (600 mg total) by mouth every 8 (eight) hours as needed. 09/08/17   Joni ReiningSmith,  K, PA-C  loratadine (CLARITIN) 10 MG tablet Take 1 tablet (10 mg total) by mouth daily. Patient not taking: Reported on 02/17/2017 07/22/16   Pucilowska, Braulio ConteJolanta B, MD  naproxen (NAPROSYN) 500 MG tablet Take 1 tablet (500 mg total) by mouth 2 (two) times daily with a  meal. 08/02/17   Joni Reining, PA-C  QUEtiapine (SEROQUEL) 300 MG tablet Take 1 tablet (300 mg total) by mouth at bedtime. Patient not taking: Reported on 02/17/2017 11/11/16   [name], [name], MD  sertraline (ZOLOFT) 100 MG tablet Take 1 tablet (100 mg total) by mouth daily. Patient not taking: Reported on 02/17/2017 11/11/16   [name], [name], MD  sulfamethoxazole-trimethoprim (BACTRIM DS,SEPTRA DS) 800-160 MG tablet Take 1 tablet by  mouth 2 (two) times daily. 08/02/17   Joni Reining, PA-C  traZODone (DESYREL) 150 MG tablet Take 1 tablet (150 mg total) by mouth at bedtime as needed for sleep. Patient not taking: Reported on 02/17/2017 11/11/16   [name], [name], MD    Allergies Penicillins; Rocephin [ceftriaxone]; Zithromax [azithromycin]; and Lidocaine  Family History  Problem Relation Age of Onset  . Diabetes Mother   . Diabetes Maternal Grandmother   . Heart disease Maternal Grandmother   . Cancer Neg Hx   . Ovarian cancer Neg Hx     Social History Social History   Tobacco Use  . Smoking status: Former Smoker    Types: Cigarettes    Last attempt to quit: 08/13/2015    Years since quitting: 2.0  . Smokeless tobacco: Never Used  Substance Use Topics  . Alcohol use: No  . Drug use: Yes    Frequency: 1.0 times per week    Types: Marijuana    Comment: Last use 10/21/16    Review of Systems Constitutional: No fever/chills Eyes: No visual changes. ENT: No sore throat. Cardiovascular: Denies chest pain. Respiratory: Denies shortness of breath. Gastrointestinal: No abdominal pain.  No nausea, no vomiting.  No diarrhea.  No constipation. Genitourinary: Negative for dysuria. Musculoskeletal: Negative for back pain. Skin: Negative for rash. Neurological: Negative for headaches, focal weakness or numbness. Psychiatric:Anxiety, bipolar, and PTSD. Allergic/Immunilogical: See medication list ____________________________________________   PHYSICAL EXAM:  VITAL SIGNS: ED Triage Vitals  Enc Vitals Group     BP 09/08/17 0814 124/73     Pulse Rate 09/08/17 0814 92     Resp 09/08/17 0814 18     Temp 09/08/17 0814 97.7 F (36.5 C)     Temp Source 09/08/17 0814 Oral     SpO2 09/08/17 0814 100 %     Weight 09/08/17 0815 195 lb (88.5 kg)     Height 09/08/17 0815 5\' 2"  (1.575 m)     Head Circumference --      Peak Flow --      Pain Score 09/08/17 0814 8     Pain Loc --      Pain Edu? --      Excl. in  GC? --    Constitutional: Alert and oriented. Well appearing and in no acute distress. Neck: No stridor.  Cervical spine tenderness to palpation. Hematological/Lymphatic/Immunilogical: No cervical lymphadenopathy. Cardiovascular: Normal rate, regular rhythm. Grossly normal heart sounds.  Good peripheral circulation. Respiratory: Normal respiratory effort.  No retractions. Lungs CTAB. Musculoskeletal: No obvious deformity to the right shoulder.  Patient has moderate guarding palpation the GH joint.  Patient decreased strength against resistance compared to the left upper extremity. Neurologic:  Normal speech and language. No gross focal neurologic deficits are appreciated. No gait instability. Skin:  Skin is warm, dry and intact. No rash noted. Psychiatric: Mood and affect are normal. Speech and behavior are normal.  ____________________________________________   LABS (all labs ordered are listed, but only abnormal results are displayed)  Labs Reviewed - No data  to display ____________________________________________  EKG   ____________________________________________  RADIOLOGY  ED MD interpretation:    Official radiology report(s): Dg Cervical Spine 2-3 Views  Result Date: 09/08/2017 CLINICAL DATA:  Increased pain in the neck with hyper extension. EXAM: CERVICAL SPINE - 2-3 VIEW COMPARISON:  Cervical spine CT scan of May 20, 2015 FINDINGS: The cervical vertebral bodies are preserved in height. The disc space heights are well maintained. There is no perched facet. The spinous processes are intact. The odontoid is intact where visualized. The prevertebral soft tissue spaces are normal. IMPRESSION: There is no acute or significant chronic bony abnormality of the cervical spine. Electronically Signed   By: David  [place] M.D.   On: 09/08/2017 09:52   Dg Shoulder Right  Result Date: 09/08/2017 CLINICAL DATA:  Patient reports right shoulder pain secondary to overhead reaching resulting  in a popping sensation of the glenohumeral joint. EXAM: RIGHT SHOULDER - 2+ VIEW COMPARISON:  Right shoulder series dated October 08, 2016 FINDINGS: The bones are subjectively adequately mineralized. There is no acute fracture nor dislocation. The joint spaces are well maintained. There is no soft tissue calcification. IMPRESSION: There is no acute or significant chronic bony abnormality of the right shoulder. Electronically Signed   By: David  [place] M.D.   On: 09/08/2017 09:53    ____________________________________________   PROCEDURES  Procedure(s) performed:   Procedures  Critical Care performed: No  ____________________________________________   INITIAL IMPRESSION / ASSESSMENT AND PLAN / ED COURSE  As part of my medical decision making, I reviewed the following data within the electronic MEDICAL RECORD NUMBER    Neck and right shoulder pain secondary to strain.  Discussed negative x-ray findings with patient.  Patient given discharge care instructions and a work note.  Patient advised take medication as directed.  Patient advised follow-up PCP for continued care.      ____________________________________________   FINAL CLINICAL IMPRESSION(S) / ED DIAGNOSES  Final diagnoses:  Strain of neck muscle, initial encounter  Acute pain of right shoulder     ED Discharge Orders        Ordered    cyclobenzaprine (FLEXERIL) 10 MG tablet  3 times daily PRN     09/08/17 1002    ibuprofen (ADVIL,MOTRIN) 600 MG tablet  Every 8 hours PRN     09/08/17 1002       Note:  This document was prepared using Dragon voice recognition software and may include unintentional dictation errors.    Joni Reining, PA-C 09/08/17 1013    Emily Filbert, MD 09/08/17 [phone]

## 2017-09-08 NOTE — ED Notes (Signed)
See triage note   States she felt a pop to right shoulder with lifting a couple of days ago  No deformity noted to shoulder   Area to neck and posterior shoulder tender on palpation

## 2017-09-21 ENCOUNTER — Emergency Department
Admission: EM | Admit: 2017-09-21 | Discharge: 2017-09-21 | Disposition: A | Discharge status: Other Acute Inpatient Hospital | Payer: Medicaid Other | Attending: Emergency Medicine | Admitting: Emergency Medicine

## 2017-09-21 ENCOUNTER — Inpatient Hospital Stay
Admission: AD | Admit: 2017-09-21 | Discharge: 2017-09-24 | DRG: 885 | Disposition: A | Discharge status: Home / Self Care | Payer: Medicaid Other | Source: Intra-hospital | Attending: Psychiatry | Admitting: Psychiatry

## 2017-09-21 ENCOUNTER — Other Ambulatory Visit: Payer: Self-pay

## 2017-09-21 ENCOUNTER — Encounter: Payer: Self-pay | Admitting: *Deleted

## 2017-09-21 DIAGNOSIS — Z818 Family history of other mental and behavioral disorders: Secondary | ICD-10-CM | POA: Diagnosis not present

## 2017-09-21 DIAGNOSIS — Z91410 Personal history of adult physical and sexual abuse: Secondary | ICD-10-CM | POA: Diagnosis not present

## 2017-09-21 DIAGNOSIS — Z79899 Other long term (current) drug therapy: Secondary | ICD-10-CM | POA: Diagnosis not present

## 2017-09-21 DIAGNOSIS — Z880 Allergy status to penicillin: Secondary | ICD-10-CM

## 2017-09-21 DIAGNOSIS — F333 Major depressive disorder, recurrent, severe with psychotic symptoms: Secondary | ICD-10-CM | POA: Diagnosis present

## 2017-09-21 DIAGNOSIS — J45909 Unspecified asthma, uncomplicated: Secondary | ICD-10-CM | POA: Diagnosis present

## 2017-09-21 DIAGNOSIS — R000 Tachycardia, unspecified: Secondary | ICD-10-CM | POA: Diagnosis not present

## 2017-09-21 DIAGNOSIS — R45851 Suicidal ideations: Secondary | ICD-10-CM | POA: Diagnosis not present

## 2017-09-21 DIAGNOSIS — F411 Generalized anxiety disorder: Secondary | ICD-10-CM

## 2017-09-21 DIAGNOSIS — F410 Panic disorder [episodic paroxysmal anxiety] without agoraphobia: Secondary | ICD-10-CM | POA: Diagnosis present

## 2017-09-21 DIAGNOSIS — Z881 Allergy status to other antibiotic agents status: Secondary | ICD-10-CM

## 2017-09-21 DIAGNOSIS — Z87891 Personal history of nicotine dependence: Secondary | ICD-10-CM | POA: Insufficient documentation

## 2017-09-21 DIAGNOSIS — G4700 Insomnia, unspecified: Secondary | ICD-10-CM | POA: Diagnosis present

## 2017-09-21 DIAGNOSIS — F4310 Post-traumatic stress disorder, unspecified: Secondary | ICD-10-CM | POA: Diagnosis present

## 2017-09-21 DIAGNOSIS — Z91411 Personal history of adult psychological abuse: Secondary | ICD-10-CM | POA: Diagnosis not present

## 2017-09-21 DIAGNOSIS — F329 Major depressive disorder, single episode, unspecified: Secondary | ICD-10-CM | POA: Diagnosis present

## 2017-09-21 DIAGNOSIS — F419 Anxiety disorder, unspecified: Secondary | ICD-10-CM | POA: Diagnosis not present

## 2017-09-21 DIAGNOSIS — E611 Iron deficiency: Secondary | ICD-10-CM | POA: Diagnosis present

## 2017-09-21 DIAGNOSIS — F1220 Cannabis dependence, uncomplicated: Secondary | ICD-10-CM | POA: Diagnosis not present

## 2017-09-21 DIAGNOSIS — F603 Borderline personality disorder: Secondary | ICD-10-CM | POA: Diagnosis present

## 2017-09-21 DIAGNOSIS — J310 Chronic rhinitis: Secondary | ICD-10-CM | POA: Diagnosis present

## 2017-09-21 DIAGNOSIS — F1290 Cannabis use, unspecified, uncomplicated: Secondary | ICD-10-CM | POA: Diagnosis present

## 2017-09-21 DIAGNOSIS — Z915 Personal history of self-harm: Secondary | ICD-10-CM | POA: Diagnosis not present

## 2017-09-21 DIAGNOSIS — Z884 Allergy status to anesthetic agent status: Secondary | ICD-10-CM | POA: Diagnosis not present

## 2017-09-21 LAB — URINE DRUG SCREEN, QUALITATIVE (ARMC ONLY)
Amphetamines, Ur Screen: NOT DETECTED
BENZODIAZEPINE, UR SCRN: NOT DETECTED
CANNABINOID 50 NG, UR ~~LOC~~: POSITIVE — AB
Cocaine Metabolite,Ur ~~LOC~~: NOT DETECTED
MDMA (Ecstasy)Ur Screen: NOT DETECTED
Methadone Scn, Ur: NOT DETECTED
Opiate, Ur Screen: NOT DETECTED
PHENCYCLIDINE (PCP) UR S: NOT DETECTED
Tricyclic, Ur Screen: NOT DETECTED

## 2017-09-21 LAB — COMPREHENSIVE METABOLIC PANEL
ALK PHOS: 85 U/L (ref 38–126)
ALT: 15 U/L (ref 0–44)
AST: 22 U/L (ref 15–41)
Albumin: 4.3 g/dL (ref 3.5–5.0)
Anion gap: 8 (ref 5–15)
BILIRUBIN TOTAL: 0.3 mg/dL (ref 0.3–1.2)
BUN: 10 mg/dL (ref 6–20)
CALCIUM: 9.2 mg/dL (ref 8.9–10.3)
CHLORIDE: 107 mmol/L (ref 98–111)
CO2: 22 mmol/L (ref 22–32)
CREATININE: 0.78 mg/dL (ref 0.44–1.00)
GFR calc Af Amer: >60 mL/min (ref >60)
Glucose, Bld: 93 mg/dL (ref 70–99)
Potassium: 3.7 mmol/L (ref 3.5–5.1)
Sodium: 137 mmol/L (ref 135–145)
TOTAL PROTEIN: 8.4 g/dL — AB (ref 6.5–8.1)

## 2017-09-21 LAB — CBC
HCT: 31.6 % — ABNORMAL LOW (ref 35.0–47.0)
Hemoglobin: 10.3 g/dL — ABNORMAL LOW (ref 12.0–16.0)
MCH: 23.7 pg — AB (ref 26.0–34.0)
MCHC: 32.5 g/dL (ref 32.0–36.0)
MCV: 72.8 fL — AB (ref 80.0–100.0)
PLATELETS: 270 10*3/uL (ref 150–440)
RBC: 4.33 MIL/uL (ref 3.80–5.20)
RDW: 17.2 % — ABNORMAL HIGH (ref 11.5–14.5)
WBC: 5.1 10*3/uL (ref 3.6–11.0)

## 2017-09-21 LAB — PREGNANCY, URINE: PREG TEST UR: NEGATIVE

## 2017-09-21 LAB — ETHANOL

## 2017-09-21 LAB — ACETAMINOPHEN LEVEL: Acetaminophen (Tylenol), Serum: <10 ug/mL — ABNORMAL LOW (ref 10–30)

## 2017-09-21 LAB — SALICYLATE LEVEL: Salicylate Lvl: <7 mg/dL (ref 2.8–30.0)

## 2017-09-21 MED ORDER — HYDROXYZINE HCL 50 MG PO TABS: 50.0000 mg | ORAL_TABLET | Freq: Three times a day (TID) | ORAL | Status: DC | PRN

## 2017-09-21 MED ORDER — QUETIAPINE FUMARATE 25 MG PO TABS: 300.0000 mg | ORAL_TABLET | Freq: Every day | ORAL | Status: DC

## 2017-09-21 MED ORDER — QUETIAPINE FUMARATE 200 MG PO TABS
300.0000 mg | ORAL_TABLET | Freq: Every day | ORAL | Status: DC
  Administered 2017-09-21 – 2017-09-23 (×3): 300 mg via ORAL
  Filled 2017-09-21 (×3): qty 1

## 2017-09-21 MED ORDER — ALUM & MAG HYDROXIDE-SIMETH 200-200-20 MG/5ML PO SUSP
30.0000 mL | ORAL | Status: DC | PRN
  Filled 2017-09-21: qty 30

## 2017-09-21 MED ORDER — QUETIAPINE FUMARATE 25 MG PO TABS
50.0000 mg | ORAL_TABLET | Freq: Every morning | ORAL | Status: DC
  Administered 2017-09-21: 50 mg via ORAL
  Filled 2017-09-21: qty 2

## 2017-09-21 MED ORDER — TRAZODONE HCL 50 MG PO TABS
150.0000 mg | ORAL_TABLET | Freq: Every evening | ORAL | Status: DC | PRN
  Administered 2017-09-21 – 2017-09-23 (×3): 150 mg via ORAL
  Filled 2017-09-21 (×3): qty 1

## 2017-09-21 MED ORDER — ACETAMINOPHEN 325 MG PO TABS
650.0000 mg | ORAL_TABLET | Freq: Four times a day (QID) | ORAL | Status: DC | PRN
  Administered 2017-09-22: 650 mg via ORAL
  Filled 2017-09-21: qty 2

## 2017-09-21 MED ORDER — SERTRALINE HCL 100 MG PO TABS
100.0000 mg | ORAL_TABLET | Freq: Every day | ORAL | Status: DC
  Administered 2017-09-22: 100 mg via ORAL
  Filled 2017-09-21: qty 1

## 2017-09-21 MED ORDER — QUETIAPINE FUMARATE 200 MG PO TABS: 200.0000 mg | ORAL_TABLET | Freq: Every day | ORAL | Status: DC

## 2017-09-21 MED ORDER — SERTRALINE HCL 100 MG PO TABS: 100.0000 mg | ORAL_TABLET | Freq: Every day | ORAL | Status: DC

## 2017-09-21 MED ORDER — MAGNESIUM HYDROXIDE 400 MG/5ML PO SUSP: 30.0000 mL | Freq: Every day | ORAL | Status: DC | PRN

## 2017-09-21 NOTE — ED Notes (Signed)

## 2017-09-21 NOTE — Progress Notes (Signed)
22 year old black female in under the service of Dr. Scharlene Glossneal   Patient  Overwhelmed  With having her baby . Patient reported having a suicidal ideation  Patient   Tried to cut herself a home . Sister took the  KnifeD: Pt appeared depressed  With  a flat affect.   Voice of auditory  Hallucinations at this time.  Pt is redirectable and cooperative with assessment.      A: Pt admitted to unit per protocol, skin assessment and search done and no contraband found  With Kristy Reid .  Pt  educated on therapeutic milieu rules. Pt was introduced to milieu by nursing staff.    R: Pt was receptive to education about the milieu .  15 min safety checks started. Clinical research associatewriter offered support

## 2017-09-21 NOTE — ED Notes (Signed)
IVC/ Plan to admit to BMU  

## 2017-09-21 NOTE — ED Triage Notes (Signed)
Pt reports feeling suicidal.  States she has been moving around and has not been able to provide for her child.  Pt calm and cooperative.  Denies etoh use.  Pt reports drug use.
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
## 2017-09-21 NOTE — BH Assessment (Signed)
Patient [facility] to [facility] by Dr. Toni Amendlapacs.  Attending Physician will be Dr. Flora Lipps'Neal.   Patient has been [facility] to room 307, [facility] Charge Nurse Lillette BoxerGwen F.   ER staff is aware of the admission:  Glenda, ER Sectary   Dr. Darnelle CatalanMalinda,, ER MD   Geralynn OchsJadeka, Patient's Nurse   Mia, Patient Access.

## 2017-09-21 NOTE — Tx Team (Signed)
Initial Treatment Plan 09/21/2017 7:07 PM Kristy JamaicaKori J Sheahan ZOX:[id number]    PATIENT STRESSORS: Financial difficulties Medication change or noncompliance Substance abuse   PATIENT STRENGTHS: Ability for insight Active sense of humor Capable of independent living Communication skills Supportive family/friends   PATIENT IDENTIFIED PROBLEMS: Depression  09/21/17  Suicidal 09/21/17  Substance Abuse 09/21/17                 DISCHARGE CRITERIA:  Ability to meet basic life and health needs Improved stabilization in mood, thinking, and/or behavior Medical problems require only outpatient monitoring  PRELIMINARY DISCHARGE PLAN: Outpatient therapy Return to previous living arrangement  PATIENT/FAMILY INVOLVEMENT: This treatment plan has been presented to and reviewed with the patient, Kristy Reid, and/or family member,  .  The patient and family have been given the opportunity to ask questions and make suggestions.  Crist InfanteGwen A Gabriela Giannelli, RN 09/21/2017, 7:07 PM

## 2017-09-21 NOTE — Consult Note (Signed)
[facility] Consult   Reason for Consult: Consult for 22 year old woman with a history of psychosis and depression who comes in with suicidal ideation Referring Physician: Rip Harbour Patient Identification: Kristy Reid MRN:  [id number] Principal Diagnosis: Depression, major, recurrent, severe with psychosis ([place]) Diagnosis:   Patient Active Problem List   Diagnosis Date Noted  . Bipolar 2 disorder ([place]) [F31.81] 11/11/2016  . Noncompliance [Z91.19] 11/11/2016  . Severe recurrent major depression with psychotic features ([place]) [F33.3] 07/18/2016  . Depression, major, recurrent, severe with psychosis ([place]) [F33.3] 07/17/2016  . Normal labor [O80, Z37.9] 05/07/2016  . Labor and delivery, indication for care [O75.9] 04/19/2016  . Indication for care in labor and delivery, antepartum [O75.9] 04/06/2016  . Decreased fetal movement [O36.8190] 03/22/2016  . Abdominal pain in pregnancy [O26.899, R10.9] 01/21/2016  . Constipation during pregnancy in second trimester [O99.612, K59.00] 12/26/2015  . Overdose [T50.901A] 11/29/2015  . Pregnancy (I trimester) [Z34.90] 11/15/2015  . Cannabis use disorder, severe, dependence ([place]) [F12.20] 11/15/2015  . Tobacco use disorder [F17.200] 11/15/2015  . Asthma [J45.909] 11/15/2015  . Borderline personality disorder ([place]) [F60.3] 11/15/2015  . PTSD (post-traumatic stress disorder) [F43.10] 11/15/2015  . Severe recurrent major depression without psychotic features ([place]) [F33.2] 11/13/2015    Total Time spent with patient: 1 hour  Subjective:   Kristy Reid is a 22 y.o. female patient admitted with "suicidal ideation".  HPI: Patient interviewed chart reviewed.  22 year old woman with a past history of psychosis depression and suicidal ideation comes in the hospital with reports of worsening mood.  She has been feeling depressed for about 3 months but things have been rapidly worsening over the last couple weeks.  She is sleeping poorly at  night.  Concentration is poor.  Not able to enjoy anything in her life.  Having thoughts about killing herself.  Yesterday tried to cut herself but her sister stopped her.  Patient is not currently on any kind of psychiatric medicine and has been off her medicine for about 3 months.  Denies alcohol use but admits to frequent marijuana use.  Cannot site any particular stressor that has been going on.  Does admit that she is hearing auditory hallucinations telling her to kill herself and these have been present for about a week.  Social history: Lives with her sister.  Has a young child.  Patient had been working at a [employer], clinical station.  Medical history: No significant chronic medical problems or acute medical complaints  Substance abuse history: Intermittent abuse of marijuana.  Probably not a foundational because of her illness.  No major alcohol or other drug use.  Past Psychiatric History: Patient has a history of prior psychiatric hospitalization prior suicide attempts.  Had been at our hospital in the past and prescribed a combination of Zoloft and Seroquel with good effect.  Was following up as an outpatient until a few months ago.  Risk to Self: Suicidal Ideation: Yes-Currently Present Suicidal Intent: Yes-Currently Present Is patient at risk for suicide?: Yes Suicidal Plan?: Yes-Currently Present Specify Current Suicidal Plan: Cut herself with a kitchen knife Access to Means: Yes Specify Access to Suicidal Means: access to knives What has been your use of drugs/alcohol within the last 12 months?: daily use of marijuana How many times?: 2 Other Self Harm Risks: denied Triggers for Past Attempts: Unknown Intentional Self Injurious Behavior: None Risk to Others: Homicidal Ideation: No Thoughts of Harm to Others: No Current Homicidal Intent: No Current Homicidal Plan: No Access to Homicidal  Means: No Identified Victim: None identified History of harm to others?: No Assessment of  Violence: None Noted Does patient have access to weapons?: No Criminal Charges Pending?: No Does patient have a court date: No Prior Inpatient Therapy: Prior Inpatient Therapy: Yes Prior Therapy Dates: 2017 and prior Prior Therapy Facilty/Provider(s): [facility] Reason for Treatment: Depression Prior Outpatient Therapy: Prior Outpatient Therapy: Yes Prior Therapy Dates: 2017 Prior Therapy Facilty/Provider(s): [facility] Reason for Treatment: Depression Does patient have an ACCT team?: No Does patient have Intensive In-House Services?  : No Does patient have [facility] services? : No Does patient have P4CC services?: No  Past Medical History:  Past Medical History:  Diagnosis Date  . Anemia   . Anemia   . Anxiety   . Asthma   . Bipolar 1 disorder ([place])   . Chronic back pain   . Depression   . Insomnia   . 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 PTSD (post-traumatic stress disorder)     Past Surgical History:  Procedure Laterality Date  . BUNIONECTOMY Bilateral    feet  . wrist sugery     Family History:  Family History  Problem Relation Age of Onset  . Diabetes Mother   . Diabetes Maternal Grandmother   . Heart disease Maternal Grandmother   . Cancer Neg Hx   . Ovarian cancer Neg Hx    Family Psychiatric  History: No family history Social History:  Social History   Substance and Sexual Activity  Alcohol Use No     Social History   Substance and Sexual Activity  Drug Use Yes  . Frequency: 1.0 times per week  . Types: Marijuana   Comment: Last use 10/21/16    Social History   Socioeconomic History  . Marital status: Single    Spouse name: Not on file  . Number of children: Not on file  . Years of education: Not on file  . Highest education level: Not on file  Occupational History  . Not on file  Social Needs  . Financial resource strain: Not on file  . Food insecurity:    Worry: Not on file    Inability: Not on file  . Transportation needs:    Medical:  Not on file    Non-medical: Not on file  Tobacco Use  . Smoking status: Former Smoker    Types: Cigarettes    Last attempt to quit: 08/13/2015    Years since quitting: 2.1  . Smokeless tobacco: Never Used  Substance and Sexual Activity  . Alcohol use: No  . Drug use: Yes    Frequency: 1.0 times per week    Types: Marijuana    Comment: Last use 10/21/16  . Sexual activity: Yes    Birth control/protection: Injection  Lifestyle  . Physical activity:    Days per week: Not on file    Minutes per session: Not on file  . Stress: Not on file  Relationships  . Social connections:    Talks on phone: Not on file    Gets together: Not on file    Attends religious service: Not on file    Active member of club or organization: Not on file    Attends meetings of clubs or organizations: Not on file    Relationship status: Not on file  Other Topics Concern  . Not on file  Social History Narrative  . Not on file   Additional Social History:    Allergies:  Allergies  Allergen Reactions  . Penicillins Hives    Has patient had a PCN reaction causing immediate rash, facial/tongue/throat swelling, SOB or lightheadedness with hypotension: No Has patient had a PCN reaction causing severe rash involving mucus membranes or skin necrosis: No Has patient had a PCN reaction that required hospitalization No Has patient had a PCN reaction occurring within the last 10 years: No If all of the above answers are "NO", then may proceed with Cephalosporin use.  . Rocephin [Ceftriaxone] Hives  . Zithromax [Azithromycin] Itching and Nausea And Vomiting  . Lidocaine Hives, Itching and Rash    Labs:  Results for orders placed or performed during the hospital encounter of 09/21/17 (from the past 48 hour(s))  Comprehensive metabolic panel     Status: Abnormal   Collection Time: 09/21/17  1:46 AM  Result Value Ref Range   Sodium 137 135 - 145 mmol/L   Potassium 3.7 3.5 - 5.1 mmol/L   Chloride 107 98 - 111  mmol/L    Comment: Please note change in reference range.   CO2 22 22 - 32 mmol/L   Glucose, Bld 93 70 - 99 mg/dL    Comment: Please note change in reference range.   BUN 10 6 - 20 mg/dL    Comment: Please note change in reference range.   Creatinine, Ser 0.78 0.44 - 1.00 mg/dL   Calcium 9.2 8.9 - 10.3 mg/dL   Total Protein 8.4 (H) 6.5 - 8.1 g/dL   Albumin 4.3 3.5 - 5.0 g/dL   AST 22 15 - 41 U/L   ALT 15 0 - 44 U/L    Comment: Please note change in reference range.   Alkaline Phosphatase 85 38 - 126 U/L   Total Bilirubin 0.3 0.3 - 1.2 mg/dL   GFR calc non Af Amer >60 >60 mL/min   GFR calc Af Amer >60 >60 mL/min    Comment: (NOTE) The eGFR has been calculated using the CKD EPI equation. This calculation has not been validated in all clinical situations. eGFR's persistently <60 mL/min signify possible Chronic Kidney Disease.    Anion gap 8 5 - 15    Comment: Performed at [facility], [address] [place], [place] [address]  Ethanol     Status: None   Collection Time: 09/21/17  1:46 AM  Result Value Ref Range   Alcohol, Ethyl (B) <10 <10 mg/dL    Comment: (NOTE) Lowest detectable limit for serum alcohol is 10 mg/dL. For medical purposes only. Performed at [facility], [address] [place], [place] [address]   Salicylate level     Status: None   Collection Time: 09/21/17  1:46 AM  Result Value Ref Range   Salicylate Lvl <7.6 2.8 - 30.0 mg/dL    Comment: Performed at [facility], [address] [place], [place] [address]  Acetaminophen level     Status: Abnormal   Collection Time: 09/21/17  1:46 AM  Result Value Ref Range   Acetaminophen (Tylenol), Serum <10 (L) 10 - 30 ug/mL    Comment: (NOTE) Therapeutic concentrations vary significantly. A range of 10-30 ug/mL  may be an effective concentration for many patients. However, some  are best treated at concentrations outside of this range. Acetaminophen concentrations >150 ug/mL at 4  hours after ingestion  and >50 ug/mL at 12 hours after ingestion are often associated with  toxic reactions. Performed at [facility], [address], [place], [place] [address]   cbc  Status: Abnormal   Collection Time: 09/21/17  1:46 AM  Result Value Ref Range   WBC 5.1 3.6 - 11.0 K/uL   RBC 4.33 3.80 - 5.20 MIL/uL   Hemoglobin 10.3 (L) 12.0 - 16.0 g/dL   HCT 31.6 (L) 35.0 - 47.0 %   MCV 72.8 (L) 80.0 - 100.0 fL   MCH 23.7 (L) 26.0 - 34.0 pg   MCHC 32.5 32.0 - 36.0 g/dL   RDW 17.2 (H) 11.5 - 14.5 %   Platelets 270 150 - 440 K/uL    Comment: Performed at [facility], [address] [place], [place] [address]  Urine Drug Screen, Qualitative     Status: Abnormal   Collection Time: 09/21/17  1:46 AM  Result Value Ref Range   Tricyclic, Ur Screen NONE DETECTED NONE DETECTED   Amphetamines, Ur Screen NONE DETECTED NONE DETECTED   MDMA (Ecstasy)Ur Screen NONE DETECTED NONE DETECTED   Cocaine Metabolite,Ur [place] NONE DETECTED NONE DETECTED   Opiate, Ur Screen NONE DETECTED NONE DETECTED   Phencyclidine (PCP) Ur S NONE DETECTED NONE DETECTED   Cannabinoid 50 Ng, Ur [place] POSITIVE (A) NONE DETECTED   Barbiturates, Ur Screen (A) NONE DETECTED    Result not available. Reagent lot number recalled by manufacturer.   Benzodiazepine, Ur Scrn NONE DETECTED NONE DETECTED   Methadone Scn, Ur NONE DETECTED NONE DETECTED    Comment: (NOTE) Tricyclics + metabolites, urine    Cutoff 1000 ng/mL Amphetamines + metabolites, urine  Cutoff 1000 ng/mL MDMA (Ecstasy), urine              Cutoff 500 ng/mL Cocaine Metabolite, urine          Cutoff 300 ng/mL Opiate + metabolites, urine        Cutoff 300 ng/mL Phencyclidine (PCP), urine         Cutoff 25 ng/mL Cannabinoid, urine                 Cutoff 50 ng/mL Barbiturates + metabolites, urine  Cutoff 200 ng/mL Benzodiazepine, urine              Cutoff 200 ng/mL Methadone, urine                   Cutoff 300 ng/mL The urine drug screen  provides only a preliminary, unconfirmed analytical test result and should not be used for non-medical purposes. Clinical consideration and professional judgment should be applied to any positive drug screen result due to possible interfering substances. A more specific alternate chemical method must be used in order to obtain a confirmed analytical result. Gas chromatography / mass spectrometry (GC/MS) is the preferred confirmat ory method. Performed at [facility], [address], [place], [place] [address]     Current Facility-Administered Medications  Medication Dose Route Frequency Provider Last Rate Last Dose  . QUEtiapine (SEROQUEL) tablet 300 mg  300 mg Oral QHS [name], [name], MD      . sertraline (ZOLOFT) tablet 100 mg  100 mg Oral Daily [name], Madie Reno, MD       Current Outpatient Medications  Medication Sig Dispense Refill  . cyclobenzaprine (FLEXERIL) 10 MG tablet Take 1 tablet (10 mg total) by mouth 3 (three) times daily as needed. 15 tablet 0  . famotidine (PEPCID) 20 MG tablet Take 1 tablet (20 mg total) by mouth 2 (two) times daily. 60 tablet 0  . ibuprofen (ADVIL,MOTRIN) 600 MG tablet Take 1 tablet (600 mg total) by mouth  every 8 (eight) hours as needed. 15 tablet 0    Musculoskeletal: Strength & Muscle Tone: within normal limits Gait & Station: normal Patient leans: N/A  Psychiatric Specialty Exam: Physical Exam  Nursing note and vitals reviewed. Constitutional: She appears well-developed and well-nourished.  HENT:  Head: Normocephalic and atraumatic.  Eyes: Pupils are equal, round, and reactive to light. Conjunctivae are normal.  Neck: Normal range of motion.  Cardiovascular: Regular rhythm and normal heart sounds.  Respiratory: Effort normal. No respiratory distress.  GI: Soft.  Musculoskeletal: Normal range of motion.  Neurological: She is alert.  Skin: Skin is warm and dry.  Psychiatric: Her affect is blunt. Her speech is delayed. She is  agitated. She is not aggressive and not hyperactive. Thought content is paranoid. Cognition and memory are impaired. She expresses impulsivity. She exhibits a depressed mood. She expresses suicidal ideation.    Review of Systems  Constitutional: Negative.   HENT: Negative.   Eyes: Negative.   Respiratory: Negative.   Cardiovascular: Negative.   Gastrointestinal: Negative.   Musculoskeletal: Negative.   Skin: Negative.   Neurological: Negative.   Psychiatric/Behavioral: Positive for depression, hallucinations, memory loss and suicidal ideas. Negative for substance abuse. The patient is nervous/anxious and has insomnia.     Blood pressure 124/72, pulse 95, temperature 99.6 F (37.6 C), temperature source Oral, resp. rate 18, height '5\' 2"'  (1.575 m), weight 83.9 kg (185 lb), SpO2 100 %, unknown if currently breastfeeding.Body mass index is 33.84 kg/m.  General Appearance: Casual  Eye Contact:  Minimal  Speech:  Slow  Volume:  Decreased  Mood:  Depressed  Affect:  Blunt  Thought Process:  Goal Directed  Orientation:  Full (Time, Place, and Person)  Thought Content:  Logical and Hallucinations: Auditory  Suicidal Thoughts:  Yes.  with intent/plan  Homicidal Thoughts:  No  Memory:  Immediate;   Fair Recent;   Fair Remote;   Fair  Judgement:  Fair  Insight:  Fair  Psychomotor Activity:  Decreased  Concentration:  Concentration: Fair  Recall:  [employer] of Knowledge:  Fair  Language:  Fair  Akathisia:  No  Handed:  Right  AIMS (if indicated):     Assets:  Desire for Improvement Housing Physical Health  ADL's:  Intact  Cognition:  Impaired,  Mild  Sleep:        Treatment Plan Summary: Daily contact with patient to assess and evaluate symptoms and progress in treatment, Medication management and Plan 22 year old woman with recurrent psychotic depression versus bipolar disorder.  Plan for admission to the hospital.  Restart Seroquel and Zoloft at previous doses.  Trazodone as  needed for sleep.  Check full set of labs including pregnancy test.  Patient agrees to plan Case reviewed with TTS and ER physician  Disposition: Recommend psychiatric Inpatient admission when medically cleared. Supportive therapy provided about ongoing stressors.  Alethia Berthold, MD 09/21/2017 3:36 PM

## 2017-09-21 NOTE — ED Notes (Signed)
Patient observed lying in bed with eyes closed  Even, unlabored respirations observed   NAD pt appears to be sleeping  I will continue to monitor along with every 15 minute visual observations and ongoing security monitoring   SOC machine placed in her room

## 2017-09-21 NOTE — ED Notes (Signed)
[facility]  - consult to be completed shortly

## 2017-09-21 NOTE — ED Notes (Signed)
ED Is the patient under IVC or is there intent for IVC:  Papers pending Is the patient medically cleared: Yes.   Is there vacancy in the ED BHU: Yes.   Is the population mix appropriate for patient: Yes.   Is the patient awaiting placement in inpatient or outpatient setting:    Has the patient had a psychiatric consult: SOC pending Survey of unit performed for contraband, proper placement and condition of furniture, tampering with fixtures in bathroom, shower, and each patient room: Yes.  ; Findings:  APPEARANCE/BEHAVIOR Calm and cooperative NEURO ASSESSMENT Orientation: oriented x4  Denies pain Hallucinations: No.None noted (Hallucinations) Speech: Normal Gait: normal RESPIRATORY ASSESSMENT Even  Unlabored respirations  CARDIOVASCULAR ASSESSMENT Pulses equal   regular rate  Skin warm and dry   GASTROINTESTINAL ASSESSMENT no GI complaint EXTREMITIES Full ROM  PLAN OF CARE Provide calm/safe environment. Vital signs assessed twice daily. ED BHU Assessment once each 12-hour shift. Collaborate with TTS daily or as condition indicates. Assure the ED provider has rounded once each shift. Provide and encourage hygiene. Provide redirection as needed. Assess for escalating behavior; address immediately and inform ED provider.  Assess family dynamic and appropriateness for visitation as needed: Yes.  ; If necessary, describe findings:  Educate the patient/family about BHU procedures/visitation: Yes.  ; If necessary, describe findings:

## 2017-09-21 NOTE — ED Provider Notes (Signed)
[facility] Emergency Department Provider Note  ____________________________________________   First MD Initiated Contact with Patient 09/21/17 0209     (approximate)  I have reviewed the triage vital signs and the nursing notes.   HISTORY  Chief Complaint Suicidal   HPI Kristy Reid is a 22 y.o. female is self presents to the emergency department expressing suicidal ideation.  She has a long-standing history of schizophrenia and depression.  Her depression is now severe.  Is worsened by interpersonal conflict and improved when she has stability in her life.  She denies actually trying to kill herself tonight but she recognized the symptoms and chose to come to the hospital for help.    Past Medical History:  Diagnosis Date  . Anemia   . Anemia   . Anxiety   . Asthma   . Bipolar 1 disorder (HCC)   . Chronic back pain   . Depression   . Insomnia   . Personality disorder (HCC)    "boarderline"  . PTSD (post-traumatic stress disorder)     Patient Active Problem List   Diagnosis Date Noted  . Anxiety disorder   . Bipolar 2 disorder (HCC) 11/11/2016  . Noncompliance 11/11/2016  . Severe recurrent major depression with psychotic features (HCC) 07/18/2016  . Depression, major, recurrent, severe with psychosis (HCC) 07/17/2016  . Normal labor 05/07/2016  . Labor and delivery, indication for care 04/19/2016  . Indication for care in labor and delivery, antepartum 04/06/2016  . Decreased fetal movement 03/22/2016  . Abdominal pain in pregnancy 01/21/2016  . Constipation during pregnancy in second trimester 12/26/2015  . Overdose 11/29/2015  . Pregnancy (I trimester) 11/15/2015  . Cannabis use disorder, severe, dependence (HCC) 11/15/2015  . Tobacco use disorder 11/15/2015  . Asthma 11/15/2015  . Borderline personality disorder (HCC) 11/15/2015  . PTSD (post-traumatic stress disorder) 11/15/2015  . Severe recurrent major depression without  psychotic features (HCC) 11/13/2015    Past Surgical History:  Procedure Laterality Date  . BUNIONECTOMY Bilateral    feet  . wrist sugery      Prior to Admission medications   Medication Sig Start Date End Date Taking? Authorizing Provider  cyclobenzaprine (FLEXERIL) 10 MG tablet Take 1 tablet (10 mg total) by mouth 3 (three) times daily as needed. 09/08/17  Yes Joni Reining, PA-C  famotidine (PEPCID) 20 MG tablet Take 1 tablet (20 mg total) by mouth 2 (two) times daily. 08/20/17  Yes Sharman Cheek, MD  ibuprofen (ADVIL,MOTRIN) 600 MG tablet Take 1 tablet (600 mg total) by mouth every 8 (eight) hours as needed. 09/08/17  Yes Joni Reining, PA-C    Allergies Penicillins; Rocephin [ceftriaxone]; Zithromax [azithromycin]; and Lidocaine  Family History  Problem Relation Age of Onset  . Diabetes Mother   . Diabetes Maternal Grandmother   . Heart disease Maternal Grandmother   . Cancer Neg Hx   . Ovarian cancer Neg Hx     Social History Social History   Tobacco Use  . Smoking status: Former Smoker    Types: Cigarettes    Last attempt to quit: 08/13/2015    Years since quitting: 2.1  . Smokeless tobacco: Never Used  Substance Use Topics  . Alcohol use: No  . Drug use: Yes    Frequency: 1.0 times per week    Types: Marijuana    Comment: Last use 10/21/16    Review of Systems Constitutional: No fever/chills Eyes: No visual changes. ENT: No sore throat. Cardiovascular: Denies  chest pain. Respiratory: Denies shortness of breath. Gastrointestinal: No abdominal pain.  No nausea, no vomiting.  No diarrhea.  No constipation. Genitourinary: Negative for dysuria. Musculoskeletal: Negative for back pain. Skin: Negative for rash. Neurological: Negative for headaches, focal weakness or numbness.   ____________________________________________   PHYSICAL EXAM:  VITAL SIGNS: ED Triage Vitals  Enc Vitals Group     BP 09/21/17 0139 124/72     Pulse Rate 09/21/17 0139 95       Resp 09/21/17 0139 18     Temp 09/21/17 0139 99.6 F (37.6 C)     Temp Source 09/21/17 0139 Oral     SpO2 09/21/17 0139 100 %     Weight 09/21/17 0140 185 lb (83.9 kg)     Height 09/21/17 0140 5\' 2"  (1.575 m)     Head Circumference --      Peak Flow --      Pain Score 09/21/17 0140 0     Pain Loc --      Pain Edu? --      Excl. in GC? --     Constitutional: Alert and oriented x4 somewhat flat affect nontoxic no diaphoresis Eyes: PERRL EOMI. Head: Atraumatic. Nose: No congestion/rhinnorhea. Mouth/Throat: No trismus Neck: No stridor.   Cardiovascular: Normal rate, regular rhythm. Grossly normal heart sounds.  Good peripheral circulation. Respiratory: Normal respiratory effort.  No retractions. Lungs CTAB and moving good air Gastrointestinal: Soft nontender Musculoskeletal: No lower extremity edema   Neurologic:  Normal speech and language. No gross focal neurologic deficits are appreciated. Skin:  Skin is warm, dry and intact. No rash noted. Psychiatric: Somewhat flat affect    ____________________________________________   DIFFERENTIAL includes but not limited to  Schizophrenia, suicidal ideation, suicide attempt, drug overdose ____________________________________________   LABS (all labs ordered are listed, but only abnormal results are displayed)  Labs Reviewed  COMPREHENSIVE METABOLIC PANEL - Abnormal; Notable for the following components:      Result Value   Total Protein 8.4 (*)    All other components within normal limits  ACETAMINOPHEN LEVEL - Abnormal; Notable for the following components:   Acetaminophen (Tylenol), Serum <10 (*)    All other components within normal limits  CBC - Abnormal; Notable for the following components:   Hemoglobin 10.3 (*)    HCT 31.6 (*)    MCV 72.8 (*)    MCH 23.7 (*)    RDW 17.2 (*)    All other components within normal limits  URINE DRUG SCREEN, QUALITATIVE ([facility] ONLY) - Abnormal; Notable for the following components:    Cannabinoid 50 Ng, Ur Narcissa POSITIVE (*)    Barbiturates, Ur Screen   (*)    Value: Result not available. Reagent lot number recalled by manufacturer.   All other components within normal limits  ETHANOL  SALICYLATE LEVEL  POC URINE PREG, ED    Lab work reviewed by me cannabis positive otherwise unremarkable __________________________________________  EKG   ____________________________________________  RADIOLOGY   ____________________________________________   PROCEDURES  Procedure(s) performed: no  Procedures  Critical Care performed: no  ____________________________________________   INITIAL IMPRESSION / ASSESSMENT AND PLAN / ED COURSE  Pertinent labs & imaging results that were available during my care of the patient were reviewed by me and considered in my medical decision making (see chart for details).   Patient is hemodynamically stable with unremarkable labs.  She is medically stable for psychiatric evaluation at this time.      ____________________________________________   FINAL CLINICAL  IMPRESSION(S) / ED DIAGNOSES  Final diagnoses:  Suicidal ideation      NEW MEDICATIONS STARTED DURING THIS VISIT:  Discharge Medication List as of 09/21/2017  5:49 PM       Note:  This document was prepared using Dragon voice recognition software and may include unintentional dictation errors.     Merrily Brittleifenbark, Dayzha Pogosyan, MD 09/23/17 [phone]

## 2017-09-21 NOTE — ED Notes (Signed)
Pt to the er for SI. Pt states she has a plan to cut herself with a knife. Pt states she attempted tonight but her sister took the knife away. No cuts to pt wrists. Pt states she has been off of her meds for months. Pt has a hx of schizophrenia and has been hospitalized in the past. Pt has a one year old and recently moved back to where she thought she would have a place to live. Pt having a difficult time supporting herslef and her child.

## 2017-09-21 NOTE — ED Notes (Signed)
Patient transferred to room BMU 307 she is alert and oriented. Patient received belongings and verbalized she has received all of his belongings. Patient appropriate and cooperative, Denies SI/HI AVH. Vital signs taken. NAD noted.

## 2017-09-21 NOTE — ED Notes (Signed)
IVC  /  INFORMED RN  AMY TEAGUE  AND  ODS  OFFFICER

## 2017-09-21 NOTE — Plan of Care (Signed)
New Admission  has not starting programing   Problem: Education: Goal: Knowledge of [facility] Education information/materials will improve Outcome: Not Progressing Goal: Emotional status will improve Outcome: Not Progressing Goal: Mental status will improve Outcome: Not Progressing Goal: Verbalization of understanding the information provided will improve Outcome: Not Progressing   Problem: Coping: Goal: Coping ability will improve Outcome: Not Progressing Goal: Will verbalize feelings Outcome: Not Progressing   Problem: Medication: Goal: Compliance with prescribed medication regimen will improve Outcome: Not Progressing   Problem: Self-Concept: Goal: Ability to disclose and discuss suicidal ideas will improve Outcome: Not Progressing Goal: Will verbalize positive feelings about self Outcome: Not Progressing

## 2017-09-21 NOTE — [facility] Group Notes (Signed)
[facility] Group Notes:  (Nursing/MHT/Case Management/Adjunct)  Date:  09/21/2017  Time:  9:36 PM  Type of Therapy:  Group Therapy  Participation Level:  Active  Participation Quality:  Appropriate  Affect:  Appropriate  Cognitive:  Appropriate  Insight:  Appropriate  Engagement in Group:  Engaged  Modes of Intervention:  Discussion  Summary of Progress/Problems: MHT reviewed rules and expectations of the unit. MHT reminded patients of visiting and phone hours. MHT asked that all patients cover themselves throughout the night because there would be routine checks. MHT informed patients of the call at [facility]6am for vitals. MHT reminded patients to complete their daily checklist forms. MHT made introduction and modeled how to introduce and inform group of daily goal. MHT reviewed topic from sessions on this day (self-esteem). MHT processed with group about letting go of the past and focusing on a better future. MHT reminded patients they could not change the past, only learn from it. MHT processed with patients about self-forgiveness. MHT encouraged patients to not worry about the things they have no control over and focus on the things they can change. MHT informed patients they could not do the same things and expect a different outcome. MHT encouraged patients to focus on the present and make changes that will move them in a positive direction. Kristy Reid stated her goal was to talk with her son. Kristy Reid stated she accomplished her goal. Kristy Reid shared about letting her first child stay with the father. Kristy Reid stated she was not able to see her child for 2 and 1/2 years. Kristy Reid stated she was depressed about it and beat herself up over it. Kristy Reid stated she was learning not to beat herself up over it and was going to keep trying to see her child. Jinger NeighborsKeith D Jonmarc Bodkin 09/21/2017, 9:36 PM

## 2017-09-21 NOTE — ED Notes (Signed)
Patient changed out into purple scrubs by this EDT. Items in patient belongings bag include vaseline, shoes, black socks, white shirt, bra, jeans, underwear, cell phone, hair tie.

## 2017-09-22 DIAGNOSIS — F411 Generalized anxiety disorder: Secondary | ICD-10-CM

## 2017-09-22 DIAGNOSIS — F1220 Cannabis dependence, uncomplicated: Secondary | ICD-10-CM

## 2017-09-22 DIAGNOSIS — F333 Major depressive disorder, recurrent, severe with psychotic symptoms: Principal | ICD-10-CM

## 2017-09-22 DIAGNOSIS — F4310 Post-traumatic stress disorder, unspecified: Secondary | ICD-10-CM

## 2017-09-22 DIAGNOSIS — F419 Anxiety disorder, unspecified: Secondary | ICD-10-CM

## 2017-09-22 LAB — LIPID PANEL
CHOLESTEROL: 153 mg/dL (ref 0–200)
HDL: 46 mg/dL (ref >40)
LDL CALC: 92 mg/dL (ref 0–99)
TRIGLYCERIDES: 74 mg/dL (ref <150)
Total CHOL/HDL Ratio: 3.3 RATIO
VLDL: 15 mg/dL (ref 0–40)

## 2017-09-22 LAB — TSH: TSH: 1.931 u[IU]/mL (ref 0.350–4.500)

## 2017-09-22 LAB — GLUCOSE, CAPILLARY: Glucose-Capillary: 111 mg/dL — ABNORMAL HIGH (ref 70–99)

## 2017-09-22 MED ORDER — ADULT MULTIVITAMIN W/MINERALS CH
1.0000 | ORAL_TABLET | Freq: Every day | ORAL | Status: DC
  Administered 2017-09-22: 1 via ORAL
  Filled 2017-09-22: qty 1

## 2017-09-22 MED ORDER — MELATONIN 5 MG PO TABS
5.0000 mg | ORAL_TABLET | Freq: Every day | ORAL | Status: DC
  Administered 2017-09-22 – 2017-09-23 (×2): 5 mg via ORAL
  Filled 2017-09-22 (×3): qty 1

## 2017-09-22 MED ORDER — CITALOPRAM HYDROBROMIDE 20 MG PO TABS
10.0000 mg | ORAL_TABLET | Freq: Every day | ORAL | Status: DC
  Administered 2017-09-23: 10 mg via ORAL
  Filled 2017-09-22: qty 1

## 2017-09-22 MED ORDER — NON FORMULARY: 5.0000 mg | Freq: Every day | Status: DC

## 2017-09-22 MED ORDER — ENSURE ENLIVE PO LIQD
237.0000 mL | Freq: Two times a day (BID) | ORAL | Status: DC
  Administered 2017-09-22 (×2): 237 mL via ORAL

## 2017-09-22 NOTE — Plan of Care (Addendum)
Patient found in room upon my arrival. Patient is visible and social this evening. Patient complains of depression and passive SI with no plan or intent. Contracts for safety. Affect is blunted. Denies pain. Denies HI/AVH. Patient is guarded and minimally verbal during our interactions. Reports eating and voiding adequately. Compliant with HS medications and staff direction. Attends group. Q 15 minute checks maintained. Will continue to monitor throughout the shift. Patient slept 7.75 minutes. Patient reports she fell. (See next progress note for details. Will endorse care to oncoming shift.  Problem: Education: Goal: Verbalization of understanding the information provided will improve Outcome: Progressing   Problem: Medication: Goal: Compliance with prescribed medication regimen will improve Outcome: Progressing   Problem: Self-Concept: Goal: Ability to disclose and discuss suicidal ideas will improve Outcome: Progressing   Problem: Nutrition: Goal: Adequate nutrition will be maintained Outcome: Progressing

## 2017-09-22 NOTE — Progress Notes (Signed)
@  [id number], patient called on call bell and states that she fell. When staff arrived to her room, patient was lying on the floor next to the door of her room. Reports that she fell to her knees due to light-headedness. Patient is assisted to bed. VS completed. VSS. No apparent injury although patient reports her knees hurt 6/10. Given Tylenol for pain. CBG 111. Patient states that she will stay in bed and refuses walker. When I returned to obtain blood sugar, patient had left the room and is now sitting in the day room. Instructed to call for assistance when standing. Verbalizes understanding. Reports being "dehydrated." Is drinking water now. Paged MD, awaiting response. Notified AC. Will endorse care to oncoming shift.

## 2017-09-22 NOTE — [facility] Suicide Risk Assessment (Signed)
[facility] INPATIENT:  Family/Significant Other Suicide Prevention Education  Suicide Prevention Education:  Education Completed; Pt's sister, Jones BroomKeyaria Alexander, at [phone]  has been identified by the patient as the family member/significant other with whom the patient will be residing, and identified as the person(s) who will aid the patient in the event of a mental health crisis (suicidal ideations/suicide attempt).  With written consent from the patient, the family member/significant other has been provided the following suicide prevention education, prior to the and/or following the discharge of the patient.  The suicide prevention education provided includes the following:  Suicide risk factors  Suicide prevention and interventions  National Suicide Hotline telephone number  [facility] assessment telephone number  [facility] Assistance 911  [facility] and/or [facility]idential [facility] telephone number  Request made of family/significant other to:  Remove weapons (e.g., guns, rifles, knives), all items previously/currently identified as safety concern.    Remove drugs/medications (over-the-counter, prescriptions, illicit drugs), all items previously/currently identified as a safety concern.  The family member/significant other verbalizes understanding of the suicide prevention education information provided.  The family member/significant other agrees to remove the items of safety concern listed above.  Pt's sister added, "Everything is good. I haven't noticed any changes in her behavior. At one point she tried to cut herself, but I didn't realize it at the time. It wasn't recently. It was a while, while back. She called me from work and asked me to take her to the hospital so I took her." Pt's sister was not able to provide a lot of clinical information regarding pt's reason for admission. Pt's sister confirmed pt lives with her and is able to  return upon discharge. Pt's sister also confirmed pt does not have access to weapons or other means of self harm. CSW will continue to coordinate with pt's sister as needed for updates and discharge planning.   Heidi DachKelsey Zak Gondek, LCSW 09/22/2017, 1:38 PM

## 2017-09-22 NOTE — Tx Team (Addendum)
Interdisciplinary Treatment and Diagnostic Plan Update  09/22/2017 Time of Session: 1100 Kristy Reid MRN: [id number]  Principal Diagnosis: <principal problem not specified>  Secondary Diagnoses: Active Problems:   Severe recurrent major depression with psychotic features (HCC)   Current Medications:  Current Facility-Administered Medications  Medication Dose Route Frequency Provider Last Rate Last Dose  . acetaminophen (TYLENOL) tablet 650 mg  650 mg Oral Q6H PRN [name], [name], MD   650 mg at 09/22/17 5643  . alum & mag hydroxide-simeth (MAALOX/MYLANTA) 200-200-20 MG/5ML suspension 30 mL  30 mL Oral Q4H PRN [name], [name], MD      . feeding supplement (ENSURE ENLIVE) (ENSURE ENLIVE) liquid 237 mL  237 mL Oral BID BM Kristy Knows, MD      . hydrOXYzine (ATARAX/VISTARIL) tablet 50 mg  50 mg Oral TID PRN [name], [name], MD      . magnesium hydroxide (MILK OF MAGNESIA) suspension 30 mL  30 mL Oral Daily PRN [name], [name], MD      . multivitamin with minerals tablet 1 tablet  1 tablet Oral Daily O'Neal, Sarita, MD      . QUEtiapine (SEROQUEL) tablet 300 mg  300 mg Oral QHS [name], [name], MD   300 mg at 09/21/17 2134  . sertraline (ZOLOFT) tablet 100 mg  100 mg Oral Daily [name], [name], MD   100 mg at 09/22/17 0817  . traZODone (DESYREL) tablet 150 mg  150 mg Oral QHS PRN [name], [name], MD   150 mg at 09/21/17 2134   PTA Medications: Medications Prior to Admission  Medication Sig Dispense Refill Last Dose  . cyclobenzaprine (FLEXERIL) 10 MG tablet Take 1 tablet (10 mg total) by mouth 3 (three) times daily as needed. 15 tablet 0 Past Month at Unknown time  . famotidine (PEPCID) 20 MG tablet Take 1 tablet (20 mg total) by mouth 2 (two) times daily. 60 tablet 0 Past Month at Unknown time  . ibuprofen (ADVIL,MOTRIN) 600 MG tablet Take 1 tablet (600 mg total) by mouth every 8 (eight) hours as needed. 15 tablet 0 Past Month at Unknown time    Patient Stressors: Financial  difficulties Medication change or noncompliance Substance abuse  Patient Strengths: Ability for insight Active sense of humor Capable of independent living Communication skills Supportive family/friends  Treatment Modalities: Medication Management, Group therapy, Case management,  1 to 1 session with clinician, Psychoeducation, Recreational therapy.   Physician Treatment Plan for Primary Diagnosis: <principal problem not specified> Long Term Goal(s):     Short Term Goals:    Medication Management: Evaluate patient's response, side effects, and tolerance of medication regimen.  Therapeutic Interventions: 1 to 1 sessions, Unit Group sessions and Medication administration.  Evaluation of Outcomes: Progressing  Physician Treatment Plan for Secondary Diagnosis: Active Problems:   Severe recurrent major depression with psychotic features (HCC)  Long Term Goal(s):     Short Term Goals:       Medication Management: Evaluate patient's response, side effects, and tolerance of medication regimen.  Therapeutic Interventions: 1 to 1 sessions, Unit Group sessions and Medication administration.  Evaluation of Outcomes: Progressing   RN Treatment Plan for Primary Diagnosis: <principal problem not specified> Long Term Goal(s): Knowledge of disease and therapeutic regimen to maintain health will improve  Short Term Goals: Ability to verbalize feelings will improve, Ability to identify and develop effective coping behaviors will improve and Compliance with prescribed medications will improve  Medication Management: RN will administer medications as ordered  by provider, will assess and evaluate patient's response and provide education to patient for prescribed medication. RN will report any adverse and/or side effects to prescribing provider.  Therapeutic Interventions: 1 on 1 counseling sessions, Psychoeducation, Medication administration, Evaluate responses to treatment, Monitor vital  signs and CBGs as ordered, Perform/monitor CIWA, COWS, AIMS and Fall Risk screenings as ordered, Perform wound care treatments as ordered.  Evaluation of Outcomes: Progressing   LCSW Treatment Plan for Primary Diagnosis: <principal problem not specified> Long Term Goal(s): Safe transition to appropriate next level of care at discharge, Engage patient in therapeutic group addressing interpersonal concerns.  Short Term Goals: Engage patient in aftercare planning with referrals and resources, Increase emotional regulation and Increase skills for wellness and recovery  Therapeutic Interventions: Assess for all discharge needs, 1 to 1 time with Social worker, Explore available resources and support systems, Assess for adequacy in community support network, Educate family and significant other(s) on suicide prevention, Complete Psychosocial Assessment, Interpersonal group therapy.  Evaluation of Outcomes: Progressing   Progress in Treatment: Attending groups: Yes. Participating in groups: Yes. Taking medication as prescribed: Yes. Toleration medication: Yes. Family/Significant other contact made: No, will contact:  pt's sister. Patient understands diagnosis: Yes. Discussing patient identified problems/goals with staff: Yes. Medical problems stabilized or resolved: Yes. Denies suicidal/homicidal ideation: Yes. Issues/concerns per patient self-inventory: No. Other: None at this time.   New problem(s) identified: No, Describe:  None at this time.  New Short Term/Long Term Goal(s): Pt will report 0 SI for at least 24 consecutive hours prior to discharge.   Patient Goals:  Pt reported her goal for treatment is, "to get back on my medication."   Discharge Plan or Barriers: Pt will be discharged home and will continue tx in the outpatient setting with [facility].   Reason for Continuation of Hospitalization: Depression Hallucinations Medication stabilization  Estimated Length of Stay: 5-7  days  Recreational Therapy: Patient Stressors: I had alot of things bottled up  Patient Goal: Patient will identify benefit of using better time management post d/c within 5 recreation therapy group sessions  Attendees: Patient: Kristy HazardKori Raybon 09/22/2017 11:27 AM  Physician: Dr. Flora Lipps'Neal, MD 09/22/2017 11:27 AM  Nursing: Hulan AmatoGwen Farrish, RN 09/22/2017 11:27 AM  RN Care Manager: 09/22/2017 11:27 AM  Social Worker: Heidi DachKelsey Craig, LCSW 09/22/2017 11:27 AM  Recreational Therapist: Garret ReddishShay Guadalupe Kerekes, CTRS-LRT 09/22/2017 11:27 AM  Other: Johny Shearsassandra Jarrett, LCSWA 09/22/2017 11:27 AM  Other: Huey RomansSonya Carter, LCSW 09/22/2017 11:27 AM  Other: Damian LeavellJohn Mullins, Cha[place]in  09/22/2017 11:27 AM    Scribe for Treatment Team: Heidi DachKelsey Craig, LCSW 09/22/2017 11:27 AM

## 2017-09-22 NOTE — Progress Notes (Signed)
Recreation Therapy Notes  Date: 09/22/2017  Time: 9:30 am  Location: Craft Room  Behavioral response: Appropriate   Intervention Topic: Coping Skills  Discussion/Intervention:  Group content on today was focused on coping skills. The group defined what coping skills are and when they can be used. Individuals described how they normally cope with things and the coping skills they normally use. Patients expressed why it is important to cope with things and how not coping with things can affect you. The group participated in the intervention "My coping box" and made coping boxes while adding coping skills they could use in the future to the box. Clinical Observations/Feedback:  Patient came to group and identified blowing bubbles and smoking as coping skills she uses. Individual explained that she also calls her family when she is dealing with rough problems. She was social with peers and staff while participating in the intervention during group.  Michie Molnar LRT/CTRS         Minoru Chap 09/22/2017 1:32 PM
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
## 2017-09-22 NOTE — Progress Notes (Signed)
D: Patient stated slept good last night .Stated appetite is good and energy level  Is normal. Stated concentration is good . Stated on Depression scale ,6 hopeless 7 and anxiety 9 Patient aware of information received  involving [place]  status improving . Working on Pharmacologistcoping skills . Compliant with medication . Encourage  To sit  Prior  .( low 0-10 high) Denies suicidal  homicidal ideations  .  No auditory hallucinations  No pain concerns . Appropriate ADL'S. Interacting with peers and staff.  A: Encourage patient participation with unit programming . Instruction  Given on  Medication , verbalize understanding. R: Voice no other concerns. Staff continue to monitor

## 2017-09-22 NOTE — Plan of Care (Signed)
Patient aware of information received  involving [place] [facility]  status improving . Working on Pharmacologistcoping skills . Compliant with medication .

## 2017-09-22 NOTE — Plan of Care (Signed)
  Problem: Education: Goal: Knowledge of [facility] Education information/materials will improve 09/22/2017 1852 by Crist InfanteFarrish, Andrzej Scully A, RN Outcome: Progressing 09/22/2017 1705 by Crist InfanteFarrish, Eliza Grissinger A, RN Outcome: Progressing Goal: Emotional status will improve 09/22/2017 1852 by Crist InfanteFarrish, Kiree Dejarnette A, RN Outcome: Progressing 09/22/2017 1705 by Crist InfanteFarrish, Rydell Wiegel A, RN Outcome: Progressing Goal: Mental status will improve 09/22/2017 1852 by Crist InfanteFarrish, Shalae Belmonte A, RN Outcome: Progressing 09/22/2017 1705 by Crist InfanteFarrish, Jacqueli Pangallo A, RN Outcome: Progressing Goal: Verbalization of understanding the information provided will improve 09/22/2017 1852 by Crist InfanteFarrish, Lev Cervone A, RN Outcome: Progressing 09/22/2017 1705 by Crist InfanteFarrish, Mubashir Mallek A, RN Outcome: Progressing   Problem: Coping: Goal: Coping ability will improve 09/22/2017 1852 by Crist InfanteFarrish, Miko Markwood A, RN Outcome: Progressing 09/22/2017 1705 by Crist InfanteFarrish, Cleora Karnik A, RN Outcome: Progressing Goal: Will verbalize feelings 09/22/2017 1852 by Crist InfanteFarrish, Kyra Laffey A, RN Outcome: Progressing 09/22/2017 1705 by Crist InfanteFarrish, Cash Meadow A, RN Outcome: Progressing   Problem: Medication: Goal: Compliance with prescribed medication regimen will improve 09/22/2017 1852 by Crist InfanteFarrish, Graceanne Guin A, RN Outcome: Progressing 09/22/2017 1705 by Crist InfanteFarrish, Lorilyn Laitinen A, RN Outcome: Progressing   Problem: Self-Concept: Goal: Ability to disclose and discuss suicidal ideas will improve 09/22/2017 1852 by Crist InfanteFarrish, Avi Kerschner A, RN Outcome: Progressing 09/22/2017 1705 by Crist InfanteFarrish, Antionetta Ator A, RN Outcome: Progressing Goal: Will verbalize positive feelings about self 09/22/2017 1852 by Crist InfanteFarrish, Evangela Heffler A, RN Outcome: Progressing 09/22/2017 1705 by Crist InfanteFarrish, Annalis Kaczmarczyk A, RN Outcome: Progressing   Problem: Self-Concept: Goal: Ability to disclose and discuss suicidal ideas will improve 09/22/2017 1852 by Crist InfanteFarrish, Laura-Lee Villegas A, RN Outcome: Progressing 09/22/2017 1705 by Crist InfanteFarrish, Wreatha Sturgeon A, RN Outcome: Progressing Goal: Will verbalize positive feelings about  self 09/22/2017 1852 by Crist InfanteFarrish, Roanne Haye A, RN Outcome: Progressing 09/22/2017 1705 by Crist InfanteFarrish, Minsa Weddington A, RN Outcome: Progressing   Problem: Nutrition: Goal: Adequate nutrition will be maintained 09/22/2017 1852 by Crist InfanteFarrish, Janelys Glassner A, RN Outcome: Progressing 09/22/2017 1705 by Crist InfanteFarrish, Rubin Dais A, RN Outcome: Progressing
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
## 2017-09-22 NOTE — Progress Notes (Signed)
Initial Nutrition Assessment  DOCUMENTATION CODES:   Not applicable  INTERVENTION:  MVI daily   Ensure Enlive po BID, each supplement provides 350 kcal and 20 grams of protein  NUTRITION DIAGNOSIS:   Predicted suboptimal nutrient intake related to social / environmental circumstances(depression, substance abuse) as evidenced by other (comment)(per chart review).  GOAL:   Patient will meet greater than or equal to 90% of their needs  MONITOR:   PO intake, Supplement acceptance  REASON FOR ASSESSMENT:   Malnutrition Screening Tool    ASSESSMENT:   22 year old woman with a history of psychosis and depression who comes in with suicidal ideation   Pt screened on [facility] report for recent wt loss. Per chart review, pt previously weight stable around 185-195lbs. Pt's admit weight is 143lbs; unsure if this is a measured or reported weight. Suspect pt with inadequate nutrient intake r/t depression and substance abuse. RD will order supplements and MVI to help pt meet her estimated needs. Pt currently eating 100% of meals in hospital.   09/21/17 143 lb (64.9 kg)  09/21/17 185 lb (83.9 kg)  09/08/17 195 lb (88.5 kg)  08/20/17 195 lb (88.5 kg)  08/02/17 195 lb (88.5 kg)  02/17/17 185 lb (83.9 kg)  01/23/17 175 lb (79.4 kg)  01/04/17 185 lb (83.9 kg)  12/31/16 179 lb (81.2 kg)  12/23/16 174 lb (78.9 kg)   Medications reviewed:  Labs reviewed:   Diet Order:   Diet Order           Diet regular Room service appropriate? No; Fluid consistency: Thin  Diet effective now         EDUCATION NEEDS:   No education needs have been identified at this time  Skin:  Skin Assessment: Reviewed RN Assessment  Last BM:  unknown   Height:   Ht Readings from Last 1 Encounters:  09/21/17 5\' 2"  (1.575 m)    Weight:   Wt Readings from Last 1 Encounters:  09/21/17 143 lb (64.9 kg)    Ideal Body Weight:  50 kg  BMI:  Body mass index is 26.16 kg/m.  Estimated Nutritional Needs:    Kcal:  1800-2100kcal/day   Protein:  89-98g/day   Fluid:  >1.8L/day   Betsey Holidayasey Daniela Hernan MS, RD, LDN Pager #- [phone] Office#- [phone] After Hours Pager: [phone]

## 2017-09-22 NOTE — Progress Notes (Signed)
Recreation Therapy Notes  INPATIENT RECREATION THERAPY ASSESSMENT  Patient Details Name: Kristy Reid MRN: [id number] DOB: 12/04/1995 Today's Date: 09/22/2017       Information Obtained From: Patient  Able to Participate in Assessment/Interview: Yes  Patient Presentation: Responsive  Reason for Admission (Per Patient): Suicidal Ideation  Patient Stressors: Other (Comment)(I had alot of things bottled up.)  Coping Skills:   Other (Comment)(Playing with my kids, drinking, smoking weed, walking, blowing bubbles)  Leisure Interests (2+):  Social - Family, Individual - TV(sleep)  Frequency of Recreation/Participation: Weekly  Awareness of Community Resources:  Yes  Community Resources:     Current Use: No  If no, Barriers?: Other (Comment)(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)  Expressed Interest in [employer] CorporationCommunity Resource Information: Yes  [place]  Patient Main Form of Transportation: Other (Comment)(My sister takes me places)  Patient Strengths:  a good mom, good  singer  Patient Identified Areas of Improvement:  have moreenergy, have better time management  Patient Goal for Hospitalization:  get medication right  Current SI (including self-harm):  No  Current HI:  No  Current AVH: No  Staff Intervention Plan: Group Attendance, Collaborate with Interdisciplinary Treatment Team  Consent to Intern Participation: N/A  Pakou Rainbow 09/22/2017, 2:37 PM

## 2017-09-22 NOTE — [facility] Group Notes (Signed)
LCSW Group Therapy Note  09/22/2017 1:00pm  Type of Therapy/Topic:  Group Therapy:  Emotion Regulation  Participation Level:  Active   Description of Group:    The purpose of this group is to assist patients in learning to regulate negative emotions and experience positive emotions. Patients will be guided to discuss ways in which they have been vulnerable to their negative emotions. These vulnerabilities will be juxtaposed with experiences of positive emotions or situations, and patients will be challenged to use positive emotions to combat negative ones. Special emphasis will be placed on coping with negative emotions in conflict situations, and patients will process healthy conflict resolution skills.  Therapeutic Goals: 1. Patient will identify two positive emotions or experiences to reflect on in order to balance out negative emotions 2. Patient will label two or more emotions that they find the most difficult to experience 3. Patient will demonstrate positive conflict resolution skills through discussion and/or role plays  Summary of Patient Progress:  Eli PhillipsKori actively participated in today's group discussion on emotion regulation.  Sarah-Jane shared that two positive emotions that she experiences are "happy" and "blessed" which she defined as feeling "calm".  Carrianne shared that the emotion that she finds to be the most difficult for her is "anger".  Miel shared that she she tried to practice conflict resolution by asking her family for support with her two young children as well as walking away from negative situations.     Therapeutic Modalities:   Cognitive Behavioral Therapy Feelings Identification Dialectical Behavioral Therapy

## 2017-09-23 LAB — HEMOGLOBIN A1C
HEMOGLOBIN A1C: 5.3 % (ref 4.8–5.6)
MEAN PLASMA GLUCOSE: 105 mg/dL

## 2017-09-23 MED ORDER — CITALOPRAM HYDROBROMIDE 20 MG PO TABS: 10.0000 mg | ORAL_TABLET | Freq: Every day | ORAL | Status: DC

## 2017-09-23 MED ORDER — IBUPROFEN 600 MG PO TABS: 600.0000 mg | ORAL_TABLET | Freq: Four times a day (QID) | ORAL | Status: DC | PRN

## 2017-09-23 MED ORDER — LORATADINE 10 MG PO TABS
10.0000 mg | ORAL_TABLET | Freq: Every day | ORAL | Status: DC
  Administered 2017-09-23 – 2017-09-24 (×2): 10 mg via ORAL
  Filled 2017-09-23 (×2): qty 1

## 2017-09-23 MED ORDER — FERROUS SULFATE 325 (65 FE) MG PO TABS
325.0000 mg | ORAL_TABLET | Freq: Every day | ORAL | Status: DC
  Administered 2017-09-23 – 2017-09-24 (×2): 325 mg via ORAL
  Filled 2017-09-23 (×2): qty 1

## 2017-09-23 MED ORDER — FLUTICASONE PROPIONATE 50 MCG/ACT NA SUSP
1.0000 | Freq: Two times a day (BID) | NASAL | Status: DC
  Administered 2017-09-23 – 2017-09-24 (×3): 1 via NASAL
  Filled 2017-09-23 (×2): qty 16

## 2017-09-23 MED ORDER — ENSURE ENLIVE PO LIQD
237.0000 mL | Freq: Two times a day (BID) | ORAL | Status: DC
  Administered 2017-09-23 – 2017-09-24 (×3): 237 mL via ORAL

## 2017-09-23 NOTE — Progress Notes (Addendum)
[facility] MD Progress Note  09/23/2017 3:28 PM Kristy Reid  MRN:  [id number] Subjective:  Patient reports the Celexa made her sleepy, so she's requesting to take it at bedtime.  Overall, pt believes she's approaching readiness for discharge. She's attending groups and states she's making goals and developing healthy coping skills.  She would like to return to work next Wed to give herself a full week on her medications. She's also hopeful she can talk to her manager in the interim and see if she can switch from night to day shifts.  Pt states she was able to visit with her son yesterday and it went well.   Pt complains of rhinitis and seasonal allergies.  She states she takes flonase at home and requests to be restarted on it.  She also takes Claritin.   Pt plans to attend [facility] at discharge. Can start next Monday, July 15th.  Motivational interviewing techniques also utilized to encourage pt to stop smoking THC. Pt states she will try to stop smoking cannabis.   We also discussed CBT and did therapeutic exercise involving understanding how thoughts influence emotions and actions.  Pt voiced understanding and states she will continue to work on formulating positive thoughts.    We also discussed sleep hygiene.  Handout provided.    Principal Problem: Severe recurrent major depression with psychotic features [facility]) Diagnosis:   Patient Active Problem List   Diagnosis Date Noted  . Anxiety disorder [F41.9]   . Bipolar 2 disorder (HCC) [F31.81] 11/11/2016  . Noncompliance [Z91.19] 11/11/2016  . Severe recurrent major depression with psychotic features (HCC) [F33.3] 07/18/2016  . Depression, major, recurrent, severe with psychosis (HCC) [F33.3] 07/17/2016  . Normal labor [O80, Z37.9] 05/07/2016  . Labor and delivery, indication for care [O75.9] 04/19/2016  . Indication for care in labor and delivery, antepartum [O75.9] 04/06/2016  . Decreased fetal movement [O36.8190] 03/22/2016  . Abdominal pain in  pregnancy [O26.899, R10.9] 01/21/2016  . Constipation during pregnancy in second trimester [O99.612, K59.00] 12/26/2015  . Overdose [T50.901A] 11/29/2015  . Pregnancy (I trimester) [Z34.90] 11/15/2015  . Cannabis use disorder, severe, dependence (HCC) [F12.20] 11/15/2015  . Tobacco use disorder [F17.200] 11/15/2015  . Asthma [J45.909] 11/15/2015  . Borderline personality disorder (HCC) [F60.3] 11/15/2015  . PTSD (post-traumatic stress disorder) [F43.10] 11/15/2015  . Severe recurrent major depression without psychotic features (HCC) [F33.2] 11/13/2015   Total Time spent with patient, providing supportive therapy, motivational interviewing and CBT; reviewing pt's chart and discussing pt with her treatment team: 45 minutes  Past Psychiatric History: see H&P  Past Medical History:  Past Medical History:  Diagnosis Date  . Anemia   . Anemia   . Anxiety   . Asthma   . Bipolar 1 disorder (HCC)   . Chronic back pain   . Depression   . Insomnia   . Personality disorder (HCC)    "boarderline"  . PTSD (post-traumatic stress disorder)     Past Surgical History:  Procedure Laterality Date  . BUNIONECTOMY Bilateral    feet  . wrist sugery     Family History:  Family History  Problem Relation Age of Onset  . Diabetes Mother   . Diabetes Maternal Grandmother   . Heart disease Maternal Grandmother   . Cancer Neg Hx   . Ovarian cancer Neg Hx    Family Psychiatric  History: see H&P Social History:  Social History   Substance and Sexual Activity  Alcohol Use No     Social  History   Substance and Sexual Activity  Drug Use Yes  . Frequency: 1.0 times per week  . Types: Marijuana   Comment: Last use 10/21/16    Social History   Socioeconomic History  . Marital status: Single    Spouse name: Not on file  . Number of children: Not on file  . Years of education: Not on file  . Highest education level: Not on file  Occupational History  . Not on file  Social Needs  .  Financial resource strain: Not on file  . Food insecurity:    Worry: Not on file    Inability: Not on file  . Transportation needs:    Medical: Not on file    Non-medical: Not on file  Tobacco Use  . Smoking status: Former Smoker    Types: Cigarettes    Last attempt to quit: 08/13/2015    Years since quitting: 2.1  . Smokeless tobacco: Never Used  Substance and Sexual Activity  . Alcohol use: No  . Drug use: Yes    Frequency: 1.0 times per week    Types: Marijuana    Comment: Last use 10/21/16  . Sexual activity: Yes    Birth control/protection: Injection  Lifestyle  . Physical activity:    Days per week: Not on file    Minutes per session: Not on file  . Stress: Not on file  Relationships  . Social connections:    Talks on phone: Not on file    Gets together: Not on file    Attends religious service: Not on file    Active member of club or organization: Not on file    Attends meetings of clubs or organizations: Not on file    Relationship status: Not on file  Other Topics Concern  . Not on file  Social History Narrative  . Not on file   Additional Social History: see H&P  Sleep: Fair, falls asleep, but difficulty staying asleep  Appetite:  improving  Current Medications: Current Facility-Administered Medications  Medication Dose Route Frequency Provider Last Rate Last Dose  . acetaminophen (TYLENOL) tablet 650 mg  650 mg Oral Q6H PRN [name], [name], MD   650 mg at 09/22/17 1610  . alum & mag hydroxide-simeth (MAALOX/MYLANTA) 200-200-20 MG/5ML suspension 30 mL  30 mL Oral Q4H PRN [name], [name], MD      . Melene Muller ON 09/24/2017] citalopram (CELEXA) tablet 10 mg  10 mg Oral QHS O'Neal, Fransico Setters, MD      . feeding supplement (ENSURE ENLIVE) (ENSURE ENLIVE) liquid 237 mL  237 mL Oral BID BM [name], MD   237 mL at 09/23/17 1026  . ferrous sulfate tablet 325 mg  325 mg Oral Q breakfast [name], MD   325 mg at 09/23/17 1025  . fluticasone (FLONASE) 50 MCG/ACT  nasal spray 1 spray  1 spray Each Nare BID [name], MD      . hydrOXYzine (ATARAX/VISTARIL) tablet 50 mg  50 mg Oral TID PRN [name], [name], MD      . ibuprofen (ADVIL,MOTRIN) tablet 600 mg  600 mg Oral Q6H PRN [name], MD      . loratadine (CLARITIN) tablet 10 mg  10 mg Oral Daily O'Neal, Yvanna Vidas, MD      . magnesium hydroxide (MILK OF MAGNESIA) suspension 30 mL  30 mL Oral Daily PRN [name], [name], MD      . Melatonin TABS 5 mg  5 mg Oral QHS  [name]'Neal, Rilyn Scroggs, MD   5 mg at 09/22/17 2034  . QUEtiapine (SEROQUEL) tablet 300 mg  300 mg Oral QHS [name], [name], MD   300 mg at 09/22/17 2035  . traZODone (DESYREL) tablet 150 mg  150 mg Oral QHS PRN [name], Jackquline DenmarkJohn T, MD   150 mg at 09/22/17 2034    Lab Results:  Results for orders placed or performed during the hospital encounter of 09/21/17 (from the past 48 hour(s))  Pregnancy, urine     Status: None   Collection Time: 09/21/17 10:00 PM  Result Value Ref Range   Preg Test, Ur NEGATIVE NEGATIVE    Comment: Performed at [facility], [address] [place], [place] [address]  Glucose, capillary     Status: Abnormal   Collection Time: 09/22/17  6:26 AM  Result Value Ref Range   Glucose-Capillary 111 (H) 70 - 99 mg/dL    Blood Alcohol level:  Lab Results  Component Value Date   ETH <10 09/21/2017   ETH <5 11/11/2016    Metabolic Disorder Labs: Lab Results  Component Value Date   HGBA1C 5.3 09/21/2017   MPG 105 09/21/2017   MPG 103 07/19/2016   Lab Results  Component Value Date   PROLACTIN 9.2 07/19/2016   PROLACTIN 67.6 (H) 11/15/2015   Lab Results  Component Value Date   CHOL 153 09/21/2017   TRIG 74 09/21/2017   HDL 46 09/21/2017   CHOLHDL 3.3 09/21/2017   VLDL 15 09/21/2017   LDLCALC 92 09/21/2017   LDLCALC 122 (H) 07/19/2016    Physical Findings: AIMS:  , ,  ,  ,    CIWA:    COWS:     Musculoskeletal: Strength & Muscle Tone: within normal limits Gait & Station: normal Patient leans:  normal stance  Psychiatric Specialty Exam: Physical Exam  Nursing note and vitals reviewed.   Review of Systems  Constitutional: Negative for chills and fever.  HENT: Positive for congestion.   Respiratory: Negative for cough and shortness of breath.   Psychiatric/Behavioral: Positive for depression and substance abuse. Negative for hallucinations and suicidal ideas. The patient is nervous/anxious. The patient does not have insomnia.     Blood pressure (!) 103/57, pulse 88, temperature 98 F (36.7 C), resp. rate 16, height 5\' 2"  (1.575 m), weight 64.9 kg (143 lb), SpO2 100 %, unknown if currently breastfeeding.Body mass index is 26.16 kg/m.  General Appearance: Fairly Groomed  Eye Contact:  Good  Speech:  Normal Rate  Volume:  Normal  Mood:  "low" but improving  Affect:  approaching euthymia  Thought Process:  Linear  Orientation:  Full (Time, Place, and Person)  Thought Content:  Logical  Suicidal Thoughts:  pt denies  Homicidal Thoughts:  pt denies  Memory:  intact  Judgement:  Fair  Insight:  Fair  Psychomotor Activity:  Normal  Concentration:  Concentration: Fair  Recall:  intact  Fund of Knowledge:  Fair  Language:  Good  Akathisia:  No  Handed:  Right  AIMS (if indicated):     Assets:  Communication Skills Desire for Improvement Housing Physical Health  ADL's:  Intact  Cognition:  WNL  Sleep:  Number of Hours: 7     Treatment Plan Summary:22 year old female who presents with depression and symptoms of psychosis. Patient appears to have some symptomology consistent with borderline personality disorder. She has a history of cutting in adolescence. She reports that a couple of days ago a voice told her to cut  herself. Patient denies that she was going to cut herself secondary to suicidal ideation. She states that she just wanted to "relieve pain".Patient is voluntary. She wants to get put back on her psychotropic medications that have worked well for her in  the past. Daily contact with patient to assess and evaluate symptoms and progress in treatment, Medication management and Plan   1) Major depression with psychotic features; PTSD; Anxiety disorder, unspecified; rule out borderline personality disorder-patient reports poor efficacy with Zoloft; thus, Zoloft will be discontinued and patient consents to a trial of Celexa daily for depression and anxiety.  Celexa 10 mg nightly (moved from day to night due to causing pt drowsiness) Continue with Seroquel300 mg nightly. Trazodone 150 mg nightly as needed for insomnia. Melatonin 5 mg nightly -supportive therapy, CBT and motivational interviewing provided We also discussed sleep hygiene.  Handout provided.   EKG QTc 440 Lipids-total cholesterol 153, HDL 46, LDL 92 Hba1c 5.2  2) Cannabis use disorder, severe and chronic -Patient was advised to cease marijuana use. We talked about the maleffects on her psychological and medical health.  -substance abuse counseling -motivational interviewing    Disposition: home/self care Tentative discharge date: 09/24/17  or 09/25/17 Follow up: [facility] 09/27/17   [name], MD 09/23/2017, 3:28 PM

## 2017-09-23 NOTE — [facility] Group Notes (Signed)
LCSW Group Therapy Note 09/23/2017 9:00 AM  Type of Therapy and Topic:  Group Therapy:  Setting Goals  Participation Level:  None  Description of Group: In this process group, patients discussed using strengths to work toward goals and address challenges.  Patients identified two positive things about themselves and one goal they were working on.  Patients were given the opportunity to share openly and support each other's plan for self-empowerment.  The group discussed the value of gratitude and were encouraged to have a daily reflection of positive characteristics or circumstances.  Patients were encouraged to identify a plan to utilize their strengths to work on current challenges and goals.  Therapeutic Goals 1. Patient will verbalize personal strengths/positive qualities and relate how these can assist with achieving desired personal goals 2. Patients will verbalize affirmation of peers plans for personal change and goal setting 3. Patients will explore the value of gratitude and positive focus as related to successful achievement of goals 4. Patients will verbalize a plan for regular reinforcement of personal positive qualities and circumstances.  Summary of Patient Progress: Kristy Reid initially presented to group and was ready to engage in the group discussion on setting SMART goals.  Blanca's RN came and took her out of group prior to the start of the group discussion.  Kristy Reid was unable to return to group.      Therapeutic Modalities Cognitive Behavioral Therapy Motivational Interviewing    Alease FrameSonya S Khalib Fendley, LCSW 09/23/2017 2:10 PM

## 2017-09-23 NOTE — Plan of Care (Addendum)
Patient found with visitor upon my arrival. Patient is visible and social this evening. Attends group. Reports improvement in mood due to visitor. Patient is less isolative, more verbal, and less isolative this evening. Denies SI/HI/AVH. Reports some improvement in mood. Affect and appearance are improved as well. Reports eating and voiding adequately. Denies pain. Requests and is given HS medications early due to vertigo in the morning. Educated patient regarding how to contact staff without getting out of bed so she does not injure herself. Verbalized understanding. Compliant with HS medications and staff direction. Given Trazodone for sleep with positive results. Q 15 minute checks maintained. Will continue to monitor throughout the shift. Patient slept 7 hours. No apparent distress. Will endorse care to oncoming shift.  Problem: Education: Goal: Emotional status will improve Outcome: Progressing Goal: Mental status will improve Outcome: Progressing Goal: Verbalization of understanding the information provided will improve Outcome: Progressing   Problem: Coping: Goal: Coping ability will improve Outcome: Progressing Goal: Will verbalize feelings Outcome: Progressing   Problem: Medication: Goal: Compliance with prescribed medication regimen will improve Outcome: Progressing   Problem: Nutrition: Goal: Adequate nutrition will be maintained Outcome: Progressing

## 2017-09-23 NOTE — Progress Notes (Signed)
Recreation Therapy Notes  Date: 09/23/2017  Time: 3:00pm  Location: Craft room  Behavioral response: Appropriate  Group Type: Game  Participation level: Active  Communication: Patient was social with peers and staff.  Comments: N/A  Celedonio Sortino LRT/CTRS        Kristy Reid 09/23/2017 3:32 PM 

## 2017-09-23 NOTE — Progress Notes (Signed)
Recreation Therapy Notes  Date: 09/23/2017  Time: 9:30 am  Location: Craft Room  Behavioral response: Appropriate   Intervention Topic: Anger Management  Discussion/Intervention:  Group content on today was focused on anger management. The group defined anger and reasons they become angry. Individuals expressed negative way they have dealt with anger in the past. Patients stated some positive ways they could deal with anger in the future. The group described how anger can affect your health and daily plans. Individuals participated in the intervention "Score your anger" where they had a chance to answer questions about themselves and get a score of their anger.  Clinical Observations/Feedback:  Patient came to group and stated that when she is angry her body tenses up and her heart rate increases.. Individual was social with peers and staff while participating in the intervention.   Daysie Helf LRT/CTRS         Evany Schecter 09/23/2017 1:05 PM

## 2017-09-23 NOTE — [facility] Group Notes (Signed)
  09/23/2017  Time: 1PM  Type of Therapy/Topic:  Group Therapy:  Balance in Life  Participation Level:  Active  Description of Group:   This group will address the concept of balance and how it feels and looks when one is unbalanced. Patients will be encouraged to process areas in their lives that are out of balance and identify reasons for remaining unbalanced. Facilitators will guide patients in utilizing problem-solving interventions to address and correct the stressor making their life unbalanced. Understanding and applying boundaries will be explored and addressed for obtaining and maintaining a balanced life. Patients will be encouraged to explore ways to assertively make their unbalanced needs known to significant others in their lives, using other group members and facilitator for support and feedback.  Therapeutic Goals: 1. Patient will identify two or more emotions or situations they have that consume much of in their lives. 2. Patient will identify signs/triggers that life has become out of balance:  3. Patient will identify two ways to set boundaries in order to achieve balance in their lives:  4. Patient will demonstrate ability to communicate their needs through discussion and/or role plays  Summary of Patient Progress: Pt continues to work towards their tx goals but has not yet reached them. Pt was able to appropriately participate in group discussion, and was able to offer support/validation to other group members. PT reported feeling, "amazing today because I actually slept last night." Pt reported one area of her life she would like to devote more attention to is, "my mindfulness." Pt reported one area of her life she would like to devote less attention to is, "my job."    Therapeutic Modalities:   Cognitive Behavioral Therapy Solution-Focused Therapy Assertiveness Training  Heidi DachKelsey Tanveer Dobberstein, MSW, LCSW Clinical Social Worker 09/23/2017 2:44 PM

## 2017-09-23 NOTE — Plan of Care (Signed)
Patient is alert and oriented denies SI, HI and AVH. Patient is pleasant and cooperative with staff and peers. Patient is knowledgeable of medications and treatment planning and able to verbalize mood. Patient is compliant with medications; patient is maintaining adequate nutrition, and sleep is adequate. Nurse will continue to monitor. Problem: Coping: Goal: Coping ability will improve Outcome: Progressing Goal: Will verbalize feelings Outcome: Progressing   Problem: Medication: Goal: Compliance with prescribed medication regimen will improve Outcome: Progressing   Problem: Self-Concept: Goal: Ability to disclose and discuss suicidal ideas will improve Outcome: Progressing Goal: Will verbalize positive feelings about self Outcome: Progressing   Problem: Nutrition: Goal: Adequate nutrition will be maintained Outcome: Progressing

## 2017-09-24 DIAGNOSIS — F603 Borderline personality disorder: Secondary | ICD-10-CM

## 2017-09-24 MED ORDER — QUETIAPINE FUMARATE 300 MG PO TABS: 300.0000 mg | ORAL_TABLET | Freq: Every day | ORAL | 0 refills | Status: DC

## 2017-09-24 MED ORDER — ENSURE ENLIVE PO LIQD: 1.0000 | Freq: Three times a day (TID) | ORAL | 0 refills | Status: AC

## 2017-09-24 MED ORDER — HYDROXYZINE HCL 50 MG PO TABS: 50.0000 mg | ORAL_TABLET | Freq: Three times a day (TID) | ORAL | 0 refills | Status: DC | PRN

## 2017-09-24 MED ORDER — FLUTICASONE PROPIONATE 50 MCG/ACT NA SUSP: 1.0000 | Freq: Two times a day (BID) | NASAL | 0 refills | Status: DC

## 2017-09-24 MED ORDER — FERROUS SULFATE 325 (65 FE) MG PO TABS: 325.0000 mg | ORAL_TABLET | Freq: Every day | ORAL | 0 refills | Status: DC

## 2017-09-24 MED ORDER — LORATADINE 10 MG PO TABS: 10.0000 mg | ORAL_TABLET | Freq: Every day | ORAL | 0 refills | Status: DC

## 2017-09-24 MED ORDER — TRAZODONE HCL 150 MG PO TABS: 150.0000 mg | ORAL_TABLET | Freq: Every evening | ORAL | 0 refills | Status: DC | PRN

## 2017-09-24 MED ORDER — MELATONIN 5 MG PO TABS: 5.0000 mg | ORAL_TABLET | Freq: Every day | ORAL | 0 refills | Status: DC

## 2017-09-24 MED ORDER — CITALOPRAM HYDROBROMIDE 10 MG PO TABS: 10.0000 mg | ORAL_TABLET | Freq: Every day | ORAL | 0 refills | Status: DC

## 2017-09-24 NOTE — Progress Notes (Signed)
  [facility] :  Will you be returning to the same living situation after discharge:  Yes,  returning home At discharge, do you have transportation home?: Yes,  family Do you have the ability to pay for your medications: Yes,  [facility]  Release of information consent forms completed and in the chart;  Patient's signature needed at discharge.  Patient to Follow up at: Follow-up Information    [facility] Go on 09/27/2017.   Why:  Please meet Unk PintoHarvey Bryant, peer support specialist with [facility], on Monday, 09/27/17 at 7:15AM to begin Peer Support Services. Thank you! Contact information: [address] [place] [address] [phone]           Next level of care provider has access to [facility] Link:no  Safety Planning and Suicide Prevention discussed: Yes,  with pt and her sister  Have you used any form of tobacco in the last 30 days? (Cigarettes, Smokeless Tobacco, Cigars, and/or Pipes): No  Has patient been referred to the Quitline?: N/A patient is not a smoker  Patient has been referred for addiction treatment: N/A  Heidi DachKelsey Brannen Koppen, LCSW 09/24/2017, 9:09 AM

## 2017-09-24 NOTE — [facility] Suicide Risk Assessment (Signed)
[facility]   Principal Problem: Severe recurrent major depression with psychotic features [facility](HCC) Discharge Diagnoses:  Patient Active Problem List   Diagnosis Date Noted  . Anxiety disorder [F41.9]   . Bipolar 2 disorder (HCC) [F31.81] 11/11/2016  . Noncompliance [Z91.19] 11/11/2016  . Severe recurrent major depression with psychotic features (HCC) [F33.3] 07/18/2016  . Depression, major, recurrent, severe with psychosis (HCC) [F33.3] 07/17/2016  . Normal labor [O80, Z37.9] 05/07/2016  . Labor and delivery, indication for care [O75.9] 04/19/2016  . Indication for care in labor and delivery, antepartum [O75.9] 04/06/2016  . Decreased fetal movement [O36.8190] 03/22/2016  . Abdominal pain in pregnancy [O26.899, R10.9] 01/21/2016  . Constipation during pregnancy in second trimester [O99.612, K59.00] 12/26/2015  . Overdose [T50.901A] 11/29/2015  . Pregnancy (I trimester) [Z34.90] 11/15/2015  . Cannabis use disorder, severe, dependence (HCC) [F12.20] 11/15/2015  . Tobacco use disorder [F17.200] 11/15/2015  . Asthma [J45.909] 11/15/2015  . Borderline personality disorder (HCC) [F60.3] 11/15/2015  . PTSD (post-traumatic stress disorder) [F43.10] 11/15/2015  . Severe recurrent major depression without psychotic features (HCC) [F33.2] 11/13/2015    Total Time spent with patient, reviewing chart and discussing patient with treatment team: 40 min  Musculoskeletal: Strength & Muscle Tone: within normal limits Gait & Station: normal Patient leans: normal stance  Psychiatric Specialty Exam: Review of Systems  Constitutional: Negative for chills, diaphoresis, fever, malaise/fatigue and weight loss.  HENT: Positive for sinus pain. Negative for congestion, ear pain, hearing loss and sore throat.   Eyes: Negative for blurred vision, pain and discharge.  Respiratory: Negative for cough, shortness of breath and wheezing.   Cardiovascular: Negative for chest pain,  palpitations and leg swelling.  Gastrointestinal: Negative for abdominal pain, constipation, diarrhea, heartburn, nausea and vomiting.  Genitourinary: Negative for dysuria.  Musculoskeletal: Negative for joint pain, myalgias and neck pain.  Skin: Negative for itching and rash.  Neurological: Positive for headaches (sinus headache). Negative for dizziness, tingling, tremors, seizures, loss of consciousness and weakness.  Psychiatric/Behavioral: Negative for depression, hallucinations and suicidal ideas. The patient is not nervous/anxious and does not have insomnia.     Blood pressure (!) 108/59, pulse (!) 129, temperature 98.3 F (36.8 C), temperature source Oral, resp. rate 18, height 5\' 2"  (1.575 m), weight 64.9 kg (143 lb), SpO2 100 %, unknown if currently breastfeeding.Body mass index is 26.16 kg/m.  General Appearance: Fairly Groomed; clean clothes; appropriate  Eye Contact::  Good  Speech:  Normal Rate409  Volume:  Normal  Mood:  "excited"  Affect:  Congruent and Full Range  Thought Process:  Linear  Orientation:  Full (Time, Place, and Person)  Thought Content:  Logical  Suicidal Thoughts:  pt denies  Homicidal Thoughts:  pt denies  Memory:  intact  Judgement:  Fair  Insight:  Fair  Psychomotor Activity:  Normal  Concentration:  Good  Recall:  intact  Fund of Knowledge:Fair  Language: Good  Akathisia:  No  Handed:  Right  AIMS (if indicated):   0  Assets:  Communication Skills Desire for Improvement Housing Physical Health Social Support  Sleep:  Number of Hours: 8  Cognition: WNL  ADL's:  Intact   Mental Status Per Nursing Assessment::   On Admission:  Suicidal ideation indicated by patient  Demographic Factors:  Low socioeconomic status  Loss Factors: job stressors  Historical Factors: Prior suicide attempts, Family history of suicide and Family history of mental illness or substance abuse  Risk Reduction Factors:   Responsible for children under  70  years of age, Sense of responsibility to family, Religious beliefs about death, Living with another person, especially a relative, Positive social support and Positive coping skills or problem solving skills  Continued Clinical Symptoms:  Previous Psychiatric Diagnoses and Treatments Medical Diagnoses and Treatments/Surgeries  Cognitive Features That Contribute To Risk:  None    Suicide Risk:  Risk factors: prior suicide attempt, family hx of suicide attempt and mental illness, pt's hx of mental illness, job stressors, financial stressors, chronic marijuana use Protective factors: social support from family/friends, willingness to seek help, denial of suicidal ideation, denial of homicidal ideation, denial of access to firearms Crisis plan: outpt resources ([facility]), 911, ED/return to hospital, talk to family/friends, crisis lines Acute suicide risk: low Chronic risk: moderate Acute violence risk: low Minimal: No identifiable suicidal ideation.  Patients presenting with no risk factors but with morbid ruminations; may be classified as minimal risk based on the severity of the depressive symptoms    Plan Of Care/Follow-up recommendations:  Activity:  As tolerated Diet:  heart healthy Other:  Follow up with outpatient psychiatry treatment  [name], MD 09/24/2017, 8:17 AM
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
## 2017-09-24 NOTE — Progress Notes (Signed)
Patient alert and oriented x 4. Ambulates unit with steady gait. Verbally denies SI/HI/AVH and pain. Patient discharged on above date and time. Verbalized understanding the discharge information provided to patient upon discharge. Patient departed unit with discharge paperwork, return note to work, prescriptions and personal belongings. Picked up by friend of the family to be transported home and plans to follow up Monday with [facility].

## 2017-09-24 NOTE — Progress Notes (Signed)
Recreation Therapy Notes  INPATIENT RECREATION TR PLAN  Patient Details Name: Kristy Reid MRN: [id number] DOB: [birth year] Today's Date: 09/24/2017  Rec Therapy Plan Is patient appropriate for Therapeutic Recreation?: Yes Treatment times per week: at least 3 Estimated Length of Stay: 5-7 days TR Treatment/Interventions: Group participation (Comment)  Discharge Criteria Pt will be discharged from therapy if:: Discharged Treatment plan/goals/alternatives discussed and agreed upon by:: Patient/family  Discharge Summary Short term goals set: Patient will identify benefit of using better time management post d/c within 5 recreation therapy group sessions Short term goals met: Adequate for discharge Progress toward goals comments: Groups attended Which groups?: Coping skills, Anger management(Team Work) Reason goals not met: N/A Therapeutic equipment acquired: N/A Reason patient discharged from therapy: Discharge from hospital Pt/family agrees with progress & goals achieved: Yes Date patient discharged from therapy: 09/17/17   Tanny Harnack 09/24/2017, 12:43 PM

## 2017-09-24 NOTE — Progress Notes (Signed)
Recreation Therapy Notes   Date: 09/24/2017  Time: 9:30 am  Location: Craft Room  Behavioral response: Appropriate   Intervention Topic: Team Work  Discussion/Intervention:  Group content on today was focused on teamwork. The group identified what teamwork is. Individuals described who is a part of their team. Patients expressed why they thought teamwork is important. The group stated reasons why they thought it was easier to work with a Comptrollersmaller/larger team. Individuals discussed some positives and negatives of working with a team. Patients gave examples of past experiences they had while working with a team. The group participated in the intervention "Story in a bag", patients were in groups and were able to test their skill in a team setting.   Clinical Observations/Feedback:  Patient came to group defined team work as two or more people working to get things done. She identified family and friends as part of her team. Individual was social with peers and staff while participating in the intervention.  Finian Helvey LRT/CTRS          Kristy Reid 09/24/2017 12:34 PM

## 2017-09-24 NOTE — Plan of Care (Signed)
Patient is exceptionally bright, mood and affect upbeat, peer to peer interactions appropriate, attended the wrap up group, requested for early medications, denied pain, denied SI/HI/AVH.    Patient slept for Estimated Hours of 8; Precautionary checks every 15 minutes for safety maintained, room free of safety hazards, patient sustains no injury or falls during this shift.  Problem: Education: Goal: Emotional status will improve Outcome: Progressing Goal: Mental status will improve Outcome: Progressing Goal: Verbalization of understanding the information provided will improve Outcome: Progressing   Problem: Coping: Goal: Coping ability will improve Outcome: Progressing Goal: Will verbalize feelings Outcome: Progressing   Problem: Medication: Goal: Compliance with prescribed medication regimen will improve Outcome: Progressing   Problem: Self-Concept: Goal: Ability to disclose and discuss suicidal ideas will improve Outcome: Progressing

## 2017-09-30 ENCOUNTER — Emergency Department
Admission: EM | Admit: 2017-09-30 | Discharge: 2017-10-01 | Disposition: A | Discharge status: Home / Self Care | Payer: Medicaid Other | Attending: Student in an Organized Health Care Education/Training Program | Admitting: Student in an Organized Health Care Education/Training Program

## 2017-09-30 ENCOUNTER — Encounter: Payer: Self-pay | Admitting: Intensive Care

## 2017-09-30 ENCOUNTER — Other Ambulatory Visit: Payer: Self-pay

## 2017-09-30 DIAGNOSIS — F3181 Bipolar II disorder: Secondary | ICD-10-CM | POA: Diagnosis present

## 2017-09-30 DIAGNOSIS — R410 Disorientation, unspecified: Secondary | ICD-10-CM | POA: Diagnosis not present

## 2017-09-30 DIAGNOSIS — F603 Borderline personality disorder: Secondary | ICD-10-CM | POA: Diagnosis present

## 2017-09-30 DIAGNOSIS — F1220 Cannabis dependence, uncomplicated: Secondary | ICD-10-CM | POA: Diagnosis present

## 2017-09-30 DIAGNOSIS — Z79899 Other long term (current) drug therapy: Secondary | ICD-10-CM | POA: Diagnosis not present

## 2017-09-30 DIAGNOSIS — F329 Major depressive disorder, single episode, unspecified: Secondary | ICD-10-CM | POA: Insufficient documentation

## 2017-09-30 DIAGNOSIS — F4310 Post-traumatic stress disorder, unspecified: Secondary | ICD-10-CM | POA: Diagnosis not present

## 2017-09-30 DIAGNOSIS — R4182 Altered mental status, unspecified: Secondary | ICD-10-CM | POA: Diagnosis present

## 2017-09-30 DIAGNOSIS — J45909 Unspecified asthma, uncomplicated: Secondary | ICD-10-CM | POA: Insufficient documentation

## 2017-09-30 DIAGNOSIS — Z87891 Personal history of nicotine dependence: Secondary | ICD-10-CM | POA: Insufficient documentation

## 2017-09-30 LAB — URINALYSIS, COMPLETE (UACMP) WITH MICROSCOPIC
BILIRUBIN URINE: NEGATIVE
Glucose, UA: NEGATIVE mg/dL
KETONES UR: 5 mg/dL — AB
LEUKOCYTES UA: NEGATIVE
Nitrite: NEGATIVE
PROTEIN: NEGATIVE mg/dL
SPECIFIC GRAVITY, URINE: 1.008 (ref 1.005–1.030)
pH: 6 (ref 5.0–8.0)

## 2017-09-30 LAB — CBC WITH DIFFERENTIAL/PLATELET
BASOS ABS: 0.1 10*3/uL (ref 0–0.1)
BASOS PCT: 1 %
EOS ABS: 0.1 10*3/uL (ref 0–0.7)
EOS PCT: 2 %
HCT: 29.3 % — ABNORMAL LOW (ref 35.0–47.0)
HEMOGLOBIN: 9.4 g/dL — AB (ref 12.0–16.0)
Lymphocytes Relative: 47 %
Lymphs Abs: 3.5 10*3/uL (ref 1.0–3.6)
MCH: 23.6 pg — ABNORMAL LOW (ref 26.0–34.0)
MCHC: 32.2 g/dL (ref 32.0–36.0)
MCV: 73.3 fL — ABNORMAL LOW (ref 80.0–100.0)
Monocytes Absolute: 0.7 10*3/uL (ref 0.2–0.9)
Monocytes Relative: 9 %
NEUTROS PCT: 41 %
Neutro Abs: 3 10*3/uL (ref 1.4–6.5)
PLATELETS: 316 10*3/uL (ref 150–440)
RBC: 4 MIL/uL (ref 3.80–5.20)
RDW: 17.5 % — ABNORMAL HIGH (ref 11.5–14.5)
WBC: 7.3 10*3/uL (ref 3.6–11.0)

## 2017-09-30 LAB — COMPREHENSIVE METABOLIC PANEL
ALBUMIN: 3.8 g/dL (ref 3.5–5.0)
ALK PHOS: 86 U/L (ref 38–126)
ALT: 28 U/L (ref 0–44)
AST: 39 U/L (ref 15–41)
Anion gap: 7 (ref 5–15)
BUN: 8 mg/dL (ref 6–20)
CALCIUM: 9.1 mg/dL (ref 8.9–10.3)
CHLORIDE: 107 mmol/L (ref 98–111)
CO2: 25 mmol/L (ref 22–32)
CREATININE: 0.92 mg/dL (ref 0.44–1.00)
GFR calc non Af Amer: >60 mL/min (ref >60)
GLUCOSE: 92 mg/dL (ref 70–99)
Potassium: 3.7 mmol/L (ref 3.5–5.1)
SODIUM: 139 mmol/L (ref 135–145)
Total Bilirubin: 0.4 mg/dL (ref 0.3–1.2)
Total Protein: 7.4 g/dL (ref 6.5–8.1)

## 2017-09-30 LAB — PREGNANCY, URINE: Preg Test, Ur: NEGATIVE

## 2017-09-30 LAB — URINE DRUG SCREEN, QUALITATIVE (ARMC ONLY)
AMPHETAMINES, UR SCREEN: NOT DETECTED
BENZODIAZEPINE, UR SCRN: NOT DETECTED
COCAINE METABOLITE, UR ~~LOC~~: NOT DETECTED
Cannabinoid 50 Ng, Ur ~~LOC~~: POSITIVE — AB
MDMA (Ecstasy)Ur Screen: NOT DETECTED
METHADONE SCREEN, URINE: NOT DETECTED
OPIATE, UR SCREEN: NOT DETECTED
PHENCYCLIDINE (PCP) UR S: NOT DETECTED
Tricyclic, Ur Screen: NOT DETECTED

## 2017-09-30 MED ORDER — CITALOPRAM HYDROBROMIDE 20 MG PO TABS
10.0000 mg | ORAL_TABLET | Freq: Every day | ORAL | Status: DC
  Administered 2017-09-30: 10 mg via ORAL
  Filled 2017-09-30: qty 1

## 2017-09-30 MED ORDER — IBUPROFEN 800 MG PO TABS
400.0000 mg | ORAL_TABLET | Freq: Once | ORAL | Status: DC
  Filled 2017-09-30: qty 1

## 2017-09-30 MED ORDER — ONDANSETRON 4 MG PO TBDP
4.0000 mg | ORAL_TABLET | Freq: Once | ORAL | Status: DC
  Filled 2017-09-30 (×2): qty 1

## 2017-09-30 MED ORDER — SODIUM CHLORIDE 0.9 % IV BOLUS
1000.0000 mL | Freq: Once | INTRAVENOUS | Status: AC
  Administered 2017-09-30: 1000 mL via INTRAVENOUS

## 2017-09-30 MED ORDER — QUETIAPINE FUMARATE 25 MG PO TABS
ORAL_TABLET | ORAL | Status: AC
  Filled 2017-09-30: qty 4

## 2017-09-30 MED ORDER — QUETIAPINE FUMARATE 300 MG PO TABS
300.0000 mg | ORAL_TABLET | Freq: Every day | ORAL | Status: DC
  Filled 2017-09-30 (×2): qty 1

## 2017-09-30 MED ORDER — TRAZODONE HCL 50 MG PO TABS: 150.0000 mg | ORAL_TABLET | Freq: Every evening | ORAL | Status: DC | PRN

## 2017-09-30 MED ORDER — QUETIAPINE FUMARATE 200 MG PO TABS
ORAL_TABLET | ORAL | Status: AC
  Filled 2017-09-30: qty 1

## 2017-09-30 NOTE — ED Notes (Addendum)
Officer at bedside to monitor pt.   Vernona RiegerLaura EDT and Vet EDT at bedside to change pt into behavioral clothing at this time.

## 2017-09-30 NOTE — ED Notes (Signed)
Called lab to notify them that blood was sent prior to orders. Lab stated they saw the orders and can run the blood at this time.

## 2017-09-30 NOTE — ED Notes (Signed)
Pt became very tearful upon starting the changing out process. Informed that her remaining calm and cooperative will aid in her going home. Informed that changing out is part of the process. Pt keeps repeating "home" and crying. Husband informed he is allowed to stay at bedside with baby and pt while pt is being medically cleared. Husband agreeable.

## 2017-09-30 NOTE — ED Triage Notes (Addendum)
Patient is slow to respond/speak. Patient keeps stating, "why are we here. I want my baby. I want to go home" Boyfriend reports she was admitted earlier this month for psych reasons and was placed on a ton of medicines that make her droopy. He also reports she has not taken medicines in a couple of days. Patient states "its spinning" when I asked her why she wanted to come to ER. Family also states this is usually her baseline but [facility]. Patient denies SI/HI. Denies hallucinations

## 2017-09-30 NOTE — ED Notes (Signed)
Nurse received report from ED nurse and Patient is transferring to room 2, she is IVC'd, she is teary eyed, will pass on in report. Patient does deny Si/hi or avh at this time. q 15 minute checks and camera surveillance in progress for safety.

## 2017-09-30 NOTE — ED Notes (Signed)
Pt. Alert and oriented, warm and dry, in no distress. Pt. Denies SI, HI, and AVH. Pt is tearful and worried about her one year old son. This Clinical research associatewriter asked patient if baby is with someone safe. Mother replied, "Yes, he is with my boyfriend but he is not his daddy, and he has seizures and has medications to take and I am not sure if he know how to give them to him."   This Clinical research associatewriter let patient call boyfriend to notify how to administer  medications. Pt. Encouraged to let nursing staff know of any concerns or needs.

## 2017-09-30 NOTE — ED Notes (Signed)
Dr.[name] at bedside  

## 2017-09-30 NOTE — ED Notes (Signed)
Patient reused ibuprofen and Zofran, stating, "My head feels better and I am not nausea anymore, I just think I need some sleep."

## 2017-09-30 NOTE — ED Notes (Signed)
IVC/Consult ordered ?

## 2017-09-30 NOTE — ED Notes (Signed)
Dr. Roxan Hockeyobinson informed this RN that he is IVC'ing pt. Charge RN informed.
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
## 2017-09-30 NOTE — ED Notes (Addendum)
Pt given meal tray. Pt stating "I just want to go home." pt informed that pt is IVC and that she can not go home tonight. Pt began crying again. Pt informed that plan is to probably DC her tomorrow but she can not go home tonight d/t being IVC. Informed that boyfriend and baby will not be able to stay the night with her. She states "I can't have my baby?" and rolls over and cries louder. Officer remains at bedside. Boyfriend informed that he will have to leave once pt is medically cleared.

## 2017-09-30 NOTE — ED Provider Notes (Signed)
[facility] Emergency Department Provider Note    None    (approximate)  I have reviewed the triage vital signs and the nursing notes.   HISTORY  Chief Complaint Altered Mental Status and Dizziness    HPI Kristy Reid is a 22 y.o. female extensive past medical history with recent admission the hospital for psychiatric admission presents to the ER with altered mental status very disorganized complaining of dizziness.  Boyfriend states the patient was told to stop taking her medications after a outpatient clinic visit several days ago.  States that her mental status is significantly deteriorated over the past few days.  Patient appears very paranoid disorganized and unable to participate much with history.  Looking about room as if responding to other stimuli.  Denies any SI or HI at this time and keeps looking about the room saying that is "spinning "    Past Medical History:  Diagnosis Date  . Anemia   . Anemia   . Anxiety   . Asthma   . Bipolar 1 disorder (HCC)   . Chronic back pain   . Depression   . Insomnia   . Personality disorder (HCC)    "boarderline"  . PTSD (post-traumatic stress disorder)    Family History  Problem Relation Age of Onset  . Diabetes Mother   . Diabetes Maternal Grandmother   . Heart disease Maternal Grandmother   . Cancer Neg Hx   . Ovarian cancer Neg Hx    Past Surgical History:  Procedure Laterality Date  . BUNIONECTOMY Bilateral    feet  . wrist sugery     Patient Active Problem List   Diagnosis Date Noted  . Anxiety disorder   . Bipolar 2 disorder (HCC) 11/11/2016  . Noncompliance 11/11/2016  . Severe recurrent major depression with psychotic features (HCC) 07/18/2016  . Depression, major, recurrent, severe with psychosis (HCC) 07/17/2016  . Normal labor 05/07/2016  . Labor and delivery, indication for care 04/19/2016  . Indication for care in labor and delivery, antepartum 04/06/2016  . Decreased  fetal movement 03/22/2016  . Abdominal pain in pregnancy 01/21/2016  . Constipation during pregnancy in second trimester 12/26/2015  . Overdose 11/29/2015  . Pregnancy (I trimester) 11/15/2015  . Cannabis use disorder, severe, dependence (HCC) 11/15/2015  . Tobacco use disorder 11/15/2015  . Asthma 11/15/2015  . Borderline personality disorder (HCC) 11/15/2015  . PTSD (post-traumatic stress disorder) 11/15/2015  . Severe recurrent major depression without psychotic features (HCC) 11/13/2015      Prior to Admission medications   Medication Sig Start Date End Date Taking? Authorizing Provider  citalopram (CELEXA) 10 MG tablet Take 1 tablet (10 mg total) by mouth at bedtime. 09/24/17   [name], MD  cyclobenzaprine (FLEXERIL) 10 MG tablet Take 1 tablet (10 mg total) by mouth 3 (three) times daily as needed. 09/08/17   Joni Reining, PA-C  feeding supplement, ENSURE ENLIVE, (ENSURE ENLIVE) LIQD Take 237 mLs by mouth 3 (three) times daily with meals. 09/24/17 10/24/17  [name], MD  ferrous sulfate 325 (65 FE) MG tablet Take 1 tablet (325 mg total) by mouth daily with breakfast. 09/24/17   [name], MD  fluticasone (FLONASE) 50 MCG/ACT nasal spray Place 1 spray into both nostrils 2 (two) times daily. 09/24/17   [name], MD  hydrOXYzine (ATARAX/VISTARIL) 50 MG tablet Take 1 tablet (50 mg total) by mouth 3 (three) times daily as needed for anxiety. 09/24/17   [name], MD  loratadine (CLARITIN) 10 MG tablet Take 1 tablet (10 mg total) by mouth daily. 09/24/17   [name]'Neal, Sarita, MD  Melatonin 5 MG TABS Take 1 tablet (5 mg total) by mouth at bedtime. 09/24/17   [name]'Neal, Sarita, MD  QUEtiapine (SEROQUEL) 300 MG tablet Take 1 tablet (300 mg total) by mouth at bedtime. 09/24/17   [name]'Neal, Sarita, MD  traZODone (DESYREL) 150 MG tablet Take 1 tablet (150 mg total) by mouth at bedtime as needed for sleep. 09/24/17   [name]'Neal, Sarita, MD    Allergies Penicillins; Rocephin  [ceftriaxone]; Zithromax [azithromycin]; and Lidocaine    Social History Social History   Tobacco Use  . Smoking status: Former Smoker    Types: Cigarettes    Last attempt to quit: 08/13/2015    Years since quitting: 2.1  . Smokeless tobacco: Never Used  Substance Use Topics  . Alcohol use: No  . Drug use: Yes    Frequency: 1.0 times per week    Types: Marijuana    Comment: Last use 10/21/16    Review of Systems Patient denies headaches, rhinorrhea, blurry vision, numbness, shortness of breath, chest pain, edema, cough, abdominal pain, nausea, vomiting, diarrhea, dysuria, fevers, rashes or hallucinations unless otherwise stated above in HPI. ____________________________________________   PHYSICAL EXAM:  VITAL SIGNS: Vitals:   09/30/17 1635  BP: 136/87  Pulse: 96  Resp: 19  Temp: 98.7 F (37.1 C)  SpO2: 100%    Constitutional: Alert, very anxious appearing and paranoid looking about the room in NAD Eyes: Conjunctivae are normal.  Head: Atraumatic. Nose: No congestion/rhinnorhea. Mouth/Throat: Mucous membranes are moist.   Neck: No stridor. Painless ROM.  Cardiovascular: Normal rate, regular rhythm. Grossly normal heart sounds.  Good peripheral circulation. Respiratory: Normal respiratory effort.  No retractions. Lungs CTAB. Gastrointestinal: Soft and nontender. No distention. No abdominal bruits. No CVA tenderness. Genitourinary: deferred Musculoskeletal: No lower extremity tenderness nor edema.  No joint effusions. Neurologic:  Normal speech and language. No gross focal neurologic deficits are appreciated. No facial droop Skin:  Skin is warm, dry and intact. No rash noted. Psychiatric: Mood and affect are normal. Speech and behavior are normal.  ____________________________________________   LABS (all labs ordered are listed, but only abnormal results are displayed)  No results found for this or any previous visit (from the past 24  hour(s)). ____________________________________________  EKG My review and personal interpretation at Time: 16:34   Indication: dizziness  Rate: 90  Rhythm: sinus Axis: normal  Other: normal intervals, no stemi ____________________________________________  RADIOLOGY   ____________________________________________   PROCEDURES  Procedure(s) performed:  Procedures    Critical Care performed: no ____________________________________________   INITIAL IMPRESSION / ASSESSMENT AND PLAN / ED COURSE  Pertinent labs & imaging results that were available during my care of the patient were reviewed by me and considered in my medical decision making (see chart for details).   DDX: .Dehydration, sepsis, pna, uti, hypoglycemia, cva, drug effect, withdrawal, encephalitis   Kristy Reid is a 22 y.o. who presents to the ED with for evaluation of AMS and dizziness with recent admission to psych facility for SI.  Patient has psych history of SI and schizophrenia.  Laboratory testing was ordered to evaluation for underlying electrolyte derangement or signs of underlying organic pathology to explain today's presentation.  Based on history and physical and laboratory evaluation, it appears that the patient's presentation is 2/2 underlying psychiatric disorder and will require further evaluation and management by inpatient psychiatry.  Patient was  made  an IVC due to disorganized thought process and bizarre behavior.  Will restart meds and observe in ER for psych re-evaluation in the AM.       As part of my medical decision making, I reviewed the following data within the electronic MEDICAL RECORD NUMBER Nursing notes reviewed and incorporated, Labs reviewed, notes from prior ED visits.   ____________________________________________   FINAL CLINICAL IMPRESSION(S) / ED DIAGNOSES  Final diagnoses:  Confusion      NEW MEDICATIONS STARTED DURING THIS VISIT:  New Prescriptions   No  medications on file     Note:  This document was prepared using Dragon voice recognition software and may include unintentional dictation errors.    Willy Eddy, MD 09/30/17 2153

## 2017-09-30 NOTE — ED Notes (Signed)
Pt stated that she is feeling better and is asking to go home. Dr. Roxan Hockeyobinson notified. Pt remains IVC.

## 2017-09-30 NOTE — ED Notes (Signed)
Pt given a warm blanket at this time.  

## 2017-09-30 NOTE — ED Notes (Signed)
Pt belongings one black and white pajama pant, one pair of black shoes,one white bra, one pair of white underwear,one green shirt, one black hair tie.

## 2017-09-30 NOTE — Consult Note (Signed)
[facility] Consult   Reason for Consult: Consult for this 22 year old woman with a history of depression and PTSD and behavior problems who comes into the emergency room complaining of being sedated and sluggish Referring Physician: Quentin Cornwall Patient Identification: Kristy Reid MRN:  [id number] Principal Diagnosis: PTSD (post-traumatic stress disorder) Diagnosis:   Patient Active Problem List   Diagnosis Date Noted  . Anxiety disorder [F41.9]   . Bipolar 2 disorder ([place]) [F31.81] 11/11/2016  . Noncompliance [Z91.19] 11/11/2016  . 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 Depression, major, recurrent, severe with psychosis ([place]) [F33.3] 07/17/2016  . Normal labor [O80, Z37.9] 05/07/2016  . Labor and delivery, indication for care [O75.9] 04/19/2016  . Indication for care in labor and delivery, antepartum [O75.9] 04/06/2016  . Decreased fetal movement [O36.8190] 03/22/2016  . Abdominal pain in pregnancy [O26.899, R10.9] 01/21/2016  . Constipation during pregnancy in second trimester [O99.612, K59.00] 12/26/2015  . Overdose [T50.901A] 11/29/2015  . Pregnancy (I trimester) [Z34.90] 11/15/2015  . Cannabis use disorder, severe, dependence ([place]) [F12.20] 11/15/2015  . Tobacco use disorder [F17.200] 11/15/2015  . Asthma [J45.909] 11/15/2015  . Borderline personality disorder ([place]) [F60.3] 11/15/2015  . PTSD (post-traumatic stress disorder) [F43.10] 11/15/2015  . Severe recurrent major depression without psychotic features ([place]) [F33.2] 11/13/2015    Total Time spent with patient: 1 hour  Subjective:   Kristy Reid is a 22 y.o. female patient admitted with "I do not know".  HPI: Patient seen chart reviewed.  Patient was just discharged from the hospital a few days ago after treatment for depression.  She was brought in this afternoon by her boyfriend with reports that she is having spells ever since discharge from the  hospital of appearing to be bizarre in her behavior and oversedated.  On examination the patient was very difficult to interview.  She was awake and would occasionally make a little eye contact but her speech was incredibly slow almost to the point that it seemed ridiculous.  Patient would only answer questions with 1 or 2 words.  Many questions she would not answer at all but would just stare off into space.  Could not name any specific trauma that had happened to her.  Claims that she has been compliant with her medicine.  Denies any acute suicidal ideation.  Will not answer questions about hallucinations.  Social history: Patient fairly recently relocated to [place] and is just trying to get restarted on treatment.  She and her boyfriend are in the process of trying to move out.  She had been working 2 jobs recently.  Has one young child who is here in the ER with her.  Medical history: No major medical issues other than mental health.  Substance abuse history: Denies that she has been using any drugs or alcohol currently drug screen is not back.  Past Psychiatric History: Patient has a long past history of psychiatric problems with apparently multiple prior hospitalizations.  Just discharged from our hospital a few days ago with diagnoses of bipolar depression and PTSD.  He was to be on citalopram and Seroquel and trazodone as her primary psychiatric medicines.  Patient denies that she has overdosed or taken any extra medicines or drugs.  Risk to Self: Suicidal Ideation: No Suicidal Intent: No Is patient at risk for suicide?: Yes Suicidal Plan?: No Specify Current Suicidal Plan: OD with pills  Access to Means: Yes Specify Access to Suicidal Means: medication What has been your  use of drugs/alcohol within the last 12 months?: THC  How many times?: 5 Other Self Harm Risks: None reported Triggers for Past Attempts: Unpredictable Intentional Self Injurious Behavior: None Risk to Others:  Homicidal Ideation: No Thoughts of Harm to Others: No Current Homicidal Intent: No Current Homicidal Plan: No Access to Homicidal Means: No Identified Victim: None reported History of harm to others?: No Assessment of Violence: In distant past Violent Behavior Description: None reported Does patient have access to weapons?: No Criminal Charges Pending?: No Does patient have a court date: No Prior Inpatient Therapy: Prior Inpatient Therapy: Yes Prior Therapy Dates: 09/21/2017, 2018 Prior Therapy Facilty/Provider(s): [facility]  Reason for Treatment: Depression Prior Outpatient Therapy: Prior Outpatient Therapy: Yes Prior Therapy Dates: 2017 Prior Therapy Facilty/Provider(s): [facility]  Reason for Treatment: Depression Does patient have an ACCT team?: No Does patient have Intensive In-House Services?  : No Does patient have [facility] services? : No Does patient have P4CC services?: No  Past Medical History:  Past Medical History:  Diagnosis Date  . Anemia   . Anemia   . Anxiety   . Asthma   . Bipolar 1 disorder ([place])   . Chronic back pain   . Depression   . Insomnia   . Personality disorder ([place])    "boarderline"  . PTSD (post-traumatic stress disorder)     Past Surgical History:  Procedure Laterality Date  . BUNIONECTOMY Bilateral    feet  . wrist sugery     Family History:  Family History  Problem Relation Age of Onset  . Diabetes Mother   . Diabetes Maternal Grandmother   . Heart disease Maternal Grandmother   . Cancer Neg Hx   . Ovarian cancer Neg Hx    Family Psychiatric  History: Unknown Social History:  Social History   Substance and Sexual Activity  Alcohol Use No     Social History   Substance and Sexual Activity  Drug Use Yes  . Frequency: 1.0 times per week  . Types: Marijuana   Comment: Last use 10/21/16    Social History   Socioeconomic History  . Marital status: Single    Spouse name: Not on file  . Number of children: Not on file  . Years of  education: Not on file  . Highest education level: Not on file  Occupational History  . Not on file  Social Needs  . Financial resource strain: Not on file  . Food insecurity:    Worry: Not on file    Inability: Not on file  . Transportation needs:    Medical: Not on file    Non-medical: Not on file  Tobacco Use  . Smoking status: Former Smoker    Types: Cigarettes    Last attempt to quit: 08/13/2015    Years since quitting: 2.1  . Smokeless tobacco: Never Used  Substance and Sexual Activity  . Alcohol use: No  . Drug use: Yes    Frequency: 1.0 times per week    Types: Marijuana    Comment: Last use 10/21/16  . Sexual activity: Yes    Birth control/protection: Injection  Lifestyle  . Physical activity:    Days per week: Not on file    Minutes per session: Not on file  . Stress: Not on file  Relationships  . Social connections:    Talks on phone: Not on file    Gets together: Not on file    Attends religious service: Not on file  Active member of club or organization: Not on file    Attends meetings of clubs or organizations: Not on file    Relationship status: Not on file  Other Topics Concern  . Not on file  Social History Narrative  . Not on file   Additional Social History:    Allergies:   Allergies  Allergen Reactions  . Penicillins Hives    Has patient had a PCN reaction causing immediate rash, facial/tongue/throat swelling, SOB or lightheadedness with hypotension: No Has patient had a PCN reaction causing severe rash involving mucus membranes or skin necrosis: No Has patient had a PCN reaction that required hospitalization No Has patient had a PCN reaction occurring within the last 10 years: No If all of the above answers are "NO", then may proceed with Cephalosporin use.  . Rocephin [Ceftriaxone] Hives  . Zithromax [Azithromycin] Itching and Nausea And Vomiting  . Lidocaine Hives, Itching and Rash    Labs:  Results for orders placed or performed  during the hospital encounter of 09/30/17 (from the past 48 hour(s))  CBC with Differential/Platelet     Status: Abnormal   Collection Time: 09/30/17  4:42 PM  Result Value Ref Range   WBC 7.3 3.6 - 11.0 K/uL   RBC 4.00 3.80 - 5.20 MIL/uL   Hemoglobin 9.4 (L) 12.0 - 16.0 g/dL   HCT 29.3 (L) 35.0 - 47.0 %   MCV 73.3 (L) 80.0 - 100.0 fL   MCH 23.6 (L) 26.0 - 34.0 pg   MCHC 32.2 32.0 - 36.0 g/dL   RDW 17.5 (H) 11.5 - 14.5 %   Platelets 316 150 - 440 K/uL   Neutrophils Relative % 41 %   Neutro Abs 3.0 1.4 - 6.5 K/uL   Lymphocytes Relative 47 %   Lymphs Abs 3.5 1.0 - 3.6 K/uL   Monocytes Relative 9 %   Monocytes Absolute 0.7 0.2 - 0.9 K/uL   Eosinophils Relative 2 %   Eosinophils Absolute 0.1 0 - 0.7 K/uL   Basophils Relative 1 %   Basophils Absolute 0.1 0 - 0.1 K/uL    Comment: Performed at [facility], [address] [place], [place] [address]  Comprehensive metabolic panel     Status: None   Collection Time: 09/30/17  4:42 PM  Result Value Ref Range   Sodium 139 135 - 145 mmol/L   Potassium 3.7 3.5 - 5.1 mmol/L   Chloride 107 98 - 111 mmol/L    Comment: Please note change in reference range.   CO2 25 22 - 32 mmol/L   Glucose, Bld 92 70 - 99 mg/dL    Comment: Please note change in reference range.   BUN 8 6 - 20 mg/dL    Comment: Please note change in reference range.   Creatinine, Ser 0.92 0.44 - 1.00 mg/dL   Calcium 9.1 8.9 - 10.3 mg/dL   Total Protein 7.4 6.5 - 8.1 g/dL   Albumin 3.8 3.5 - 5.0 g/dL   AST 39 15 - 41 U/L   ALT 28 0 - 44 U/L    Comment: Please note change in reference range.   Alkaline Phosphatase 86 38 - 126 U/L   Total Bilirubin 0.4 0.3 - 1.2 mg/dL   GFR calc non Af Amer >60 >60 mL/min   GFR calc Af Amer >60 >60 mL/min    Comment: (NOTE) The eGFR has been calculated using the CKD EPI equation. This calculation has not been validated in all clinical situations. eGFR's  persistently <60 mL/min signify possible Chronic Kidney Disease.     Anion gap 7 5 - 15    Comment: Performed at [facility], [address] [place], [place] [address]    Current Facility-Administered Medications  Medication Dose Route Frequency Provider Last Rate Last Dose  . citalopram (CELEXA) tablet 10 mg  10 mg Oral QHS Ebrahim Deremer T, MD      . QUEtiapine (SEROQUEL) tablet 300 mg  300 mg Oral QHS Terah Robey T, MD      . traZODone (DESYREL) tablet 150 mg  150 mg Oral QHS PRN Weylin Plagge, Madie Reno, MD       Current Outpatient Medications  Medication Sig Dispense Refill  . citalopram (CELEXA) 10 MG tablet Take 1 tablet (10 mg total) by mouth at bedtime. 30 tablet 0  . cyclobenzaprine (FLEXERIL) 10 MG tablet Take 1 tablet (10 mg total) by mouth 3 (three) times daily as needed. 15 tablet 0  . feeding supplement, ENSURE ENLIVE, (ENSURE ENLIVE) LIQD Take 237 mLs by mouth 3 (three) times daily with meals. 90 Bottle 0  . ferrous sulfate 325 (65 FE) MG tablet Take 1 tablet (325 mg total) by mouth daily with breakfast. 30 tablet 0  . fluticasone (FLONASE) 50 MCG/ACT nasal spray Place 1 spray into both nostrils 2 (two) times daily. 16 g 0  . hydrOXYzine (ATARAX/VISTARIL) 50 MG tablet Take 1 tablet (50 mg total) by mouth 3 (three) times daily as needed for anxiety. 90 tablet 0  . loratadine (CLARITIN) 10 MG tablet Take 1 tablet (10 mg total) by mouth daily. 30 tablet 0  . Melatonin 5 MG TABS Take 1 tablet (5 mg total) by mouth at bedtime. 30 tablet 0  . QUEtiapine (SEROQUEL) 300 MG tablet Take 1 tablet (300 mg total) by mouth at bedtime. 30 tablet 0  . traZODone (DESYREL) 150 MG tablet Take 1 tablet (150 mg total) by mouth at bedtime as needed for sleep. 30 tablet 0    Musculoskeletal: Strength & Muscle Tone: decreased Gait & Station: unable to stand Patient leans: N/A  Psychiatric Specialty Exam: Physical Exam  Nursing note and vitals reviewed. Constitutional: She appears well-developed and well-nourished.  HENT:  Head: Normocephalic and  atraumatic.  Eyes: Pupils are equal, round, and reactive to light. Conjunctivae are normal.  Neck: Normal range of motion.  Cardiovascular: Regular rhythm and normal heart sounds.  Respiratory: Effort normal. No respiratory distress.  GI: Soft.  Musculoskeletal: Normal range of motion.  Neurological: She is alert.  Skin: Skin is warm and dry.  Psychiatric: Her affect is blunt. Her speech is delayed and slurred. She is slowed and withdrawn. Cognition and memory are impaired. She expresses inappropriate judgment. She expresses no suicidal ideation. She is noncommunicative.    Review of Systems  Constitutional: Negative.   HENT: Negative.   Eyes: Negative.   Respiratory: Negative.   Cardiovascular: Negative.   Gastrointestinal: Negative.   Musculoskeletal: Negative.   Skin: Negative.   Neurological: Negative.   Psychiatric/Behavioral: Positive for depression. The patient is nervous/anxious.     Blood pressure 126/76, pulse 96, temperature 98.7 F (37.1 C), temperature source Oral, resp. rate 17, height _0  (1.575 m), weight 72.6 kg (160 lb), SpO2 100 %, unknown if currently breastfeeding.Body mass index is 29.26 kg/m.  General Appearance: Casual  Eye Contact:  Minimal  Speech:  Garbled, Slow and Slurred  Volume:  Decreased  Mood:  Dysphoric  Affect:  Constricted  Thought Process:  Disorganized  Orientation:  Negative  Thought Content:  Illogical  Suicidal Thoughts:  No  Homicidal Thoughts:  No  Memory:  Immediate;   Fair Recent;   Poor Remote;   Poor  Judgement:  Impaired  Insight:  Shallow  Psychomotor Activity:  Decreased  Concentration:  Concentration: Poor  Recall:  Poor  Fund of Knowledge:  Fair  Language:  Fair  Akathisia:  No  Handed:  Right  AIMS (if indicated):     Assets:  Desire for Improvement Housing Physical Health Resilience Social Support  ADL's:  Impaired  Cognition:  Impaired,  Mild  Sleep:        Treatment Plan Summary: Plan Patient seen  chart reviewed.  Patient is markedly different than she was when she came to the hospital last time.  Her boyfriend proposed that this could be oversedation from the medicine.  But the history is that these spells come and go during the daytime without any particular pattern.  Furthermore the patient does not look like a person who is sedated but rather this looks like either catatonia or some sort of intentional behavior.  Patient was advised that with her current bizarre behavior we would not feel comfortable with her going home but that we felt that this was likely to improve on its own.  Patient does not seem to necessarily require inpatient hospitalization.  Recommend continuing her usual outpatient medicine and evaluating overnight.  Also in the differential diagnosis could be overuse of Flexeril which can be intoxicating although the patient denied that.  I have put in her regular medicines just the basics.  We will continue to follow up.  Disposition: Supportive therapy provided about ongoing stressors.  Alethia Berthold, MD 09/30/2017 6:15 PM

## 2017-10-01 NOTE — Consult Note (Signed)
[facility] Psych ED Progress Note  10/01/2017 3:27 PM Rogena Temiloluwa Recchia  MRN:  [id number] Subjective: Follow-up for this woman in the emergency room who was being monitored overnight.  On reassessment the patient is feeling much better.  She feels like she is back to her baseline.  Denies mood symptoms.  Denies any cognitive impairment denies suicidal thought.  Patient is awake alert and moving normally.  Makes good eye contact.  Normal conversation.  Appears to be lucid.  Patient continues to believe that this was all because of her medicine that she was acting the way she was yesterday. Principal Problem: PTSD (post-traumatic stress disorder) Diagnosis:   Patient Active Problem List   Diagnosis Date Noted  . Anxiety disorder [F41.9]   . Bipolar 2 disorder ([place]) [F31.81] 11/11/2016  . Noncompliance [Z91.19] 11/11/2016  . 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 Depression, major, recurrent, severe with psychosis ([place]) [F33.3] 07/17/2016  . Normal labor [O80, Z37.9] 05/07/2016  . Labor and delivery, indication for care [O75.9] 04/19/2016  . Indication for care in labor and delivery, antepartum [O75.9] 04/06/2016  . Decreased fetal movement [O36.8190] 03/22/2016  . Abdominal pain in pregnancy [O26.899, R10.9] 01/21/2016  . Constipation during pregnancy in second trimester [O99.612, K59.00] 12/26/2015  . Overdose [T50.901A] 11/29/2015  . Pregnancy (I trimester) [Z34.90] 11/15/2015  . Cannabis use disorder, severe, dependence ([place]) [F12.20] 11/15/2015  . Tobacco use disorder [F17.200] 11/15/2015  . Asthma [J45.909] 11/15/2015  . Borderline personality disorder ([place]) [F60.3] 11/15/2015  . PTSD (post-traumatic stress disorder) [F43.10] 11/15/2015  . Severe recurrent major depression without psychotic features ([place]) [F33.2] 11/13/2015   Total Time spent with patient: 20 minutes  Past Psychiatric History: Patient has a history of bipolar disorder and PTSD  with previous hospitalizations  Past Medical History:  Past Medical History:  Diagnosis Date  . Anemia   . Anemia   . Anxiety   . Asthma   . Bipolar 1 disorder ([place])   . Chronic back pain   . Depression   . Insomnia   . Personality disorder ([place])    "boarderline"  . PTSD (post-traumatic stress disorder)     Past Surgical History:  Procedure Laterality Date  . BUNIONECTOMY Bilateral    feet  . wrist sugery     Family History:  Family History  Problem Relation Age of Onset  . Diabetes Mother   . Diabetes Maternal Grandmother   . Heart disease Maternal Grandmother   . Cancer Neg Hx   . Ovarian cancer Neg Hx    Family Psychiatric  History: See previous note Social History:  Social History   Substance and Sexual Activity  Alcohol Use No     Social History   Substance and Sexual Activity  Drug Use Yes  . Frequency: 1.0 times per week  . Types: Marijuana   Comment: Last use 10/21/16    Social History   Socioeconomic History  . Marital status: Single    Spouse name: Not on file  . Number of children: Not on file  . Years of education: Not on file  . Highest education level: Not on file  Occupational History  . Not on file  Social Needs  . Financial resource strain: Not on file  . Food insecurity:    Worry: Not on file    Inability: Not on file  . Transportation needs:    Medical: Not on file    Non-medical: Not on file  Tobacco  Use  . Smoking status: Former Smoker    Types: Cigarettes    Last attempt to quit: 08/13/2015    Years since quitting: 2.1  . Smokeless tobacco: Never Used  Substance and Sexual Activity  . Alcohol use: No  . Drug use: Yes    Frequency: 1.0 times per week    Types: Marijuana    Comment: Last use 10/21/16  . Sexual activity: Yes    Birth control/protection: Injection  Lifestyle  . Physical activity:    Days per week: Not on file    Minutes per session: Not on file  . Stress: Not on file  Relationships  . Social  connections:    Talks on phone: Not on file    Gets together: Not on file    Attends religious service: Not on file    Active member of club or organization: Not on file    Attends meetings of clubs or organizations: Not on file    Relationship status: Not on file  Other Topics Concern  . Not on file  Social History Narrative  . Not on file    Sleep: Fair  Appetite:  Fair  Current Medications: Current Facility-Administered Medications  Medication Dose Route Frequency Provider Last Rate Last Dose  . citalopram (CELEXA) tablet 10 mg  10 mg Oral QHS Zhanna Melin, Madie Reno, MD   10 mg at 09/30/17 2151  . ibuprofen (ADVIL,MOTRIN) tablet 400 mg  400 mg Oral Once Orbie Pyo, MD   Stopped at 09/30/17 2011  . ondansetron (ZOFRAN-ODT) disintegrating tablet 4 mg  4 mg Oral Once Orbie Pyo, MD   Stopped at 09/30/17 2010  . QUEtiapine (SEROQUEL) tablet 300 mg  300 mg Oral QHS Mckinna Demars T, MD      . traZODone (DESYREL) tablet 150 mg  150 mg Oral QHS PRN Silus Lanzo, Madie Reno, MD       Current Outpatient Medications  Medication Sig Dispense Refill  . citalopram (CELEXA) 10 MG tablet Take 1 tablet (10 mg total) by mouth at bedtime. 30 tablet 0  . cyclobenzaprine (FLEXERIL) 10 MG tablet Take 1 tablet (10 mg total) by mouth 3 (three) times daily as needed. 15 tablet 0  . feeding supplement, ENSURE ENLIVE, (ENSURE ENLIVE) LIQD Take 237 mLs by mouth 3 (three) times daily with meals. 90 Bottle 0  . ferrous sulfate 325 (65 FE) MG tablet Take 1 tablet (325 mg total) by mouth daily with breakfast. 30 tablet 0  . fluticasone (FLONASE) 50 MCG/ACT nasal spray Place 1 spray into both nostrils 2 (two) times daily. 16 g 0  . hydrOXYzine (ATARAX/VISTARIL) 50 MG tablet Take 1 tablet (50 mg total) by mouth 3 (three) times daily as needed for anxiety. 90 tablet 0  . loratadine (CLARITIN) 10 MG tablet Take 1 tablet (10 mg total) by mouth daily. 30 tablet 0  . Melatonin 5 MG TABS Take 1 tablet (5  mg total) by mouth at bedtime. 30 tablet 0  . QUEtiapine (SEROQUEL) 300 MG tablet Take 1 tablet (300 mg total) by mouth at bedtime. 30 tablet 0  . traZODone (DESYREL) 150 MG tablet Take 1 tablet (150 mg total) by mouth at bedtime as needed for sleep. 30 tablet 0    Lab Results:  Results for orders placed or performed during the hospital encounter of 09/30/17 (from the past 48 hour(s))  CBC with Differential/Platelet     Status: Abnormal   Collection Time: 09/30/17  4:42 PM  Result Value  Ref Range   WBC 7.3 3.6 - 11.0 K/uL   RBC 4.00 3.80 - 5.20 MIL/uL   Hemoglobin 9.4 (L) 12.0 - 16.0 g/dL   HCT 29.3 (L) 35.0 - 47.0 %   MCV 73.3 (L) 80.0 - 100.0 fL   MCH 23.6 (L) 26.0 - 34.0 pg   MCHC 32.2 32.0 - 36.0 g/dL   RDW 17.5 (H) 11.5 - 14.5 %   Platelets 316 150 - 440 K/uL   Neutrophils Relative % 41 %   Neutro Abs 3.0 1.4 - 6.5 K/uL   Lymphocytes Relative 47 %   Lymphs Abs 3.5 1.0 - 3.6 K/uL   Monocytes Relative 9 %   Monocytes Absolute 0.7 0.2 - 0.9 K/uL   Eosinophils Relative 2 %   Eosinophils Absolute 0.1 0 - 0.7 K/uL   Basophils Relative 1 %   Basophils Absolute 0.1 0 - 0.1 K/uL    Comment: Performed at [facility], [address] [place], [place] [address]  Comprehensive metabolic panel     Status: None   Collection Time: 09/30/17  4:42 PM  Result Value Ref Range   Sodium 139 135 - 145 mmol/L   Potassium 3.7 3.5 - 5.1 mmol/L   Chloride 107 98 - 111 mmol/L    Comment: Please note change in reference range.   CO2 25 22 - 32 mmol/L   Glucose, Bld 92 70 - 99 mg/dL    Comment: Please note change in reference range.   BUN 8 6 - 20 mg/dL    Comment: Please note change in reference range.   Creatinine, Ser 0.92 0.44 - 1.00 mg/dL   Calcium 9.1 8.9 - 10.3 mg/dL   Total Protein 7.4 6.5 - 8.1 g/dL   Albumin 3.8 3.5 - 5.0 g/dL   AST 39 15 - 41 U/L   ALT 28 0 - 44 U/L    Comment: Please note change in reference range.   Alkaline Phosphatase 86 38 - 126 U/L   Total  Bilirubin 0.4 0.3 - 1.2 mg/dL   GFR calc non Af Amer >60 >60 mL/min   GFR calc Af Amer >60 >60 mL/min    Comment: (NOTE) The eGFR has been calculated using the CKD EPI equation. This calculation has not been validated in all clinical situations. eGFR's persistently <60 mL/min signify possible Chronic Kidney Disease.    Anion gap 7 5 - 15    Comment: Performed at [facility], [address] [place], [place] [address]  Urinalysis, Complete w Microscopic     Status: Abnormal   Collection Time: 09/30/17  9:58 PM  Result Value Ref Range   Color, Urine STRAW (A) YELLOW   APPearance CLEAR (A) CLEAR   Specific Gravity, Urine 1.008 1.005 - 1.030   pH 6.0 5.0 - 8.0   Glucose, UA NEGATIVE NEGATIVE mg/dL   Hgb urine dipstick LARGE (A) NEGATIVE   Bilirubin Urine NEGATIVE NEGATIVE   Ketones, ur 5 (A) NEGATIVE mg/dL   Protein, ur NEGATIVE NEGATIVE mg/dL   Nitrite NEGATIVE NEGATIVE   Leukocytes, UA NEGATIVE NEGATIVE   WBC, UA 0-5 0 - 5 WBC/hpf   Bacteria, UA RARE (A) NONE SEEN   Squamous Epithelial / LPF 0-5 0 - 5    Comment: Performed at [facility], [address], [place], [place] [address]  Urine Drug Screen, Qualitative ([facility] only)     Status: Abnormal   Collection Time: 09/30/17  9:58 PM  Result Value Ref Range  Tricyclic, Ur Screen NONE DETECTED NONE DETECTED   Amphetamines, Ur Screen NONE DETECTED NONE DETECTED   MDMA (Ecstasy)Ur Screen NONE DETECTED NONE DETECTED   Cocaine Metabolite,Ur [place] NONE DETECTED NONE DETECTED   Opiate, Ur Screen NONE DETECTED NONE DETECTED   Phencyclidine (PCP) Ur S NONE DETECTED NONE DETECTED   Cannabinoid 50 Ng, Ur [place] POSITIVE (A) NONE DETECTED   Barbiturates, Ur Screen (A) NONE DETECTED    Result not available. Reagent lot number recalled by manufacturer.   Benzodiazepine, Ur Scrn NONE DETECTED NONE DETECTED   Methadone Scn, Ur NONE DETECTED NONE DETECTED    Comment: (NOTE) Tricyclics + metabolites, urine    Cutoff 1000  ng/mL Amphetamines + metabolites, urine  Cutoff 1000 ng/mL MDMA (Ecstasy), urine              Cutoff 500 ng/mL Cocaine Metabolite, urine          Cutoff 300 ng/mL Opiate + metabolites, urine        Cutoff 300 ng/mL Phencyclidine (PCP), urine         Cutoff 25 ng/mL Cannabinoid, urine                 Cutoff 50 ng/mL Barbiturates + metabolites, urine  Cutoff 200 ng/mL Benzodiazepine, urine              Cutoff 200 ng/mL Methadone, urine                   Cutoff 300 ng/mL The urine drug screen provides only a preliminary, unconfirmed analytical test result and should not be used for non-medical purposes. Clinical consideration and professional judgment should be applied to any positive drug screen result due to possible interfering substances. A more specific alternate chemical method must be used in order to obtain a confirmed analytical result. Gas chromatography / mass spectrometry (GC/MS) is the preferred confirmat ory method. Performed at [facility], [address] [place], [place] [address]   Pregnancy, urine     Status: None   Collection Time: 09/30/17  9:58 PM  Result Value Ref Range   Preg Test, Ur NEGATIVE NEGATIVE    Comment: Performed at [facility], [address] [place], [place] [address]    Blood Alcohol level:  Lab Results  Component Value Date   [facility] <10 09/21/2017   ETH <5 11/11/2016    Physical Findings: AIMS:  , ,  ,  ,    CIWA:    COWS:     Musculoskeletal: Strength & Muscle Tone: within normal limits Gait & Station: normal Patient leans: N/A  Psychiatric Specialty Exam: Physical Exam  Nursing note and vitals reviewed. Constitutional: She appears well-developed and well-nourished.  HENT:  Head: Normocephalic and atraumatic.  Eyes: Pupils are equal, round, and reactive to light. Conjunctivae are normal.  Neck: Normal range of motion.  Cardiovascular: Regular rhythm and normal heart sounds.  Respiratory: Effort normal.   GI: Soft.  Musculoskeletal: Normal range of motion.  Neurological: She is alert.  Skin: Skin is warm and dry.  Psychiatric: She has a normal mood and affect. Her speech is normal and behavior is normal. Judgment and thought content normal. Her affect is not blunt. Cognition and memory are normal.    Review of Systems  Constitutional: Negative.   HENT: Negative.   Eyes: Negative.   Respiratory: Negative.   Cardiovascular: Negative.   Gastrointestinal: Negative.   Musculoskeletal: Negative.   Skin: Negative.   Neurological: Negative.  Psychiatric/Behavioral: Negative.     Blood pressure 138/88, pulse 88, temperature 98.7 F (37.1 C), temperature source Oral, resp. rate 15, height '5\' 2"'  (1.575 m), weight 72.6 kg (160 lb), SpO2 100 %, unknown if currently breastfeeding.Body mass index is 29.26 kg/m.  General Appearance: Casual  Eye Contact:  Good  Speech:  Clear and Coherent  Volume:  Normal  Mood:  Euthymic  Affect:  Congruent  Thought Process:  Goal Directed  Orientation:  Full (Time, Place, and Person)  Thought Content:  Logical  Suicidal Thoughts:  No  Homicidal Thoughts:  No  Memory:  Immediate;   Fair Recent;   Fair Remote;   Fair  Judgement:  Fair  Insight:  Fair  Psychomotor Activity:  Normal  Concentration:  Concentration: Fair  Recall:  [employer] of Knowledge:  Fair  Language:  Fair  Akathisia:  No  Handed:  Right  AIMS (if indicated):     Assets:  Desire for Improvement Housing Physical Health Social Support  ADL's:  Intact  Cognition:  WNL  Sleep:         Treatment Plan Summary: Plan Patient appears to have returned to her baseline.  No sign of any acute dangerousness.  We reviewed medication plan with the patient.  She says she is getting follow-up with her primary care doctor and that she already had been told to cut her Seroquel in half which is what she is planning to do.  Patient counseled about the importance of the need for continued  follow-up in the community.  Discontinued IV C.  Case reviewed with ER physician.  Alethia Berthold, MD 10/01/2017, 3:27 PM

## 2017-10-01 NOTE — ED Notes (Signed)
Patient discharged home, patient received discharge papers. Patient received belongings and verbalized he has received all of his belongings. Patient appropriate and cooperative, Denies SI/HI AVH. Vital signs taken. NAD noted. 

## 2017-10-01 NOTE — ED Notes (Signed)
IVC/Consult completed/ Pending D/C after overnight evaluting

## 2017-10-01 NOTE — Discharge Instructions (Signed)
You have been seen in the emergency department for a  psychiatric concern. You have been evaluated both medically as well as psychiatrically. Please follow-up with your outpatient resources provided. Return to the emergency department for any worsening symptoms, or any thoughts of hurting yourself or anyone else so that we may attempt to help you. 

## 2017-10-01 NOTE — ED Provider Notes (Signed)
-----------------------------------------   6:25 AM on 10/01/2017 -----------------------------------------   Blood pressure 138/88, pulse 88, temperature 98.7 F (37.1 C), temperature source Oral, resp. rate 15, height 1.575 m (5\' 2" ), weight 72.6 kg (160 lb), SpO2 100 %, unknown if currently breastfeeding.  The patient had no acute events since last update.  Calm and cooperative at this time.  The patient is being reevaluated by psychiatry and definitive disposition has not yet been determined.    Loleta RoseForbach, Kaladin Noseworthy, MD 10/01/17 [phone]

## 2017-10-01 NOTE — ED Notes (Addendum)
Patient visiting with aunt Rushie GoltzFaith. Patient is appropriate and cooperative. NAD noted

## 2017-10-01 NOTE — ED Provider Notes (Signed)
-----------------------------------------   4:00 PM on 10/01/2017 -----------------------------------------  Patient has been seen by psychiatry, they believe the patient is safe for discharge home with PCP follow-up.  Patient's medical work-up has been largely nonrevealing.  We will discharge from the emergency department.   Minna AntisPaduchowski, Kaycie Pegues, MD 10/01/17 1600

## 2017-10-01 NOTE — ED Notes (Signed)
Hourly rounding reveals patient sleeping in room. No complaints, stable, in no acute distress. Q15 minute rounds and monitoring via Security Cameras to continue. 

## 2017-10-25 ENCOUNTER — Inpatient Hospital Stay
Admit: 2017-10-25 | Discharge: 2017-10-26 | Disposition: A | Discharge status: Home / Self Care | Payer: Medicaid Other | Attending: Emergency Medicine

## 2017-10-25 ENCOUNTER — Emergency Department: Admit: 2017-10-25 | Payer: Medicaid Other

## 2017-10-25 ENCOUNTER — Emergency Department: Admit: 2017-10-26 | Payer: Medicaid Other

## 2017-10-25 DIAGNOSIS — N760 Acute vaginitis: Secondary | ICD-10-CM

## 2017-10-25 LAB — CBC WITH AUTO DIFFERENTIAL
Basophils %: 1 % (ref 0–2)
Basophils Absolute: 0.03 10*3/uL (ref 0.00–0.20)
Eosinophils %: 2 % (ref 1–4)
Eosinophils Absolute: 0.13 10*3/uL (ref 0.00–0.44)
Hematocrit: 34.5 % — ABNORMAL LOW (ref 36.3–47.1)
Hemoglobin: 10.1 g/dL — ABNORMAL LOW (ref 11.9–15.1)
Immature Granulocytes %: 0 %
Immature Granulocytes Absolute: <0.03 10*3/uL (ref 0.00–0.30)
Lymphocytes Absolute: 3.15 10*3/uL (ref 1.10–3.70)
Lymphocytes: 48 % — ABNORMAL HIGH (ref 24–43)
MCH: 22.4 pg — ABNORMAL LOW (ref 25.2–33.5)
MCHC: 29.3 g/dL (ref 28.4–34.8)
MCV: 76.7 fL — ABNORMAL LOW (ref 82.6–102.9)
MPV: 12.2 fL (ref 8.1–13.5)
Monocytes %: 9 % (ref 3–12)
Monocytes Absolute: 0.56 10*3/uL (ref 0.10–1.20)
NRBC Automated: 0 per 100 WBC
Neutrophils Absolute: 2.59 10*3/uL (ref 1.50–8.10)
Platelets: 327 10*3/uL (ref 138–453)
RBC: 4.5 m/uL (ref 3.95–5.11)
RDW: 17.1 % — ABNORMAL HIGH (ref 11.8–14.4)
Seg Neutrophils: 40 % (ref 36–65)
WBC: 6.5 10*3/uL (ref 3.5–11.3)

## 2017-10-25 LAB — URINALYSIS WITH MICROSCOPIC
Bilirubin Urine: NEGATIVE
Casts UA: 0 /LPF (ref 0–8)
Epithelial Cells, UA: 10 /[HPF] (ref 0–5)
Glucose, Ur: NEGATIVE
Ketones, Urine: NEGATIVE
Nitrite, Urine: NEGATIVE
Protein, UA: NEGATIVE
Specific Gravity, UA: 1.019 (ref 1.005–1.030)
Urine Hgb: NEGATIVE
Urobilinogen, Urine: NORMAL
WBC, UA: 2 /HPF (ref 0–5)
pH, UA: 6.5 (ref 5.0–8.0)

## 2017-10-25 LAB — BASIC METABOLIC PANEL
Anion Gap: 13 mmol/L (ref 9–17)
BUN: 10 mg/dL (ref 6–20)
CO2: 23 mmol/L (ref 20–31)
Calcium: 9.8 mg/dL (ref 8.6–10.4)
Chloride: 100 mmol/L (ref 98–107)
Creatinine: 0.85 mg/dL (ref 0.50–0.90)
GFR African American: >60 mL/min (ref >60)
GFR Non-African American: >60 mL/min (ref >60)
Glucose: 99 mg/dL (ref 70–99)
Potassium: 3.6 mmol/L — ABNORMAL LOW (ref 3.7–5.3)
Sodium: 136 mmol/L (ref 135–144)

## 2017-10-25 LAB — HCG, SERUM, QUALITATIVE: Preg, Serum: NEGATIVE

## 2017-10-25 LAB — TROPONIN: Troponin, High Sensitivity: <6 ng/L (ref 0–14)

## 2017-10-25 MED ORDER — ONDANSETRON HCL 4 MG/2ML IJ SOLN
4 MG/2ML | Freq: Once | INTRAMUSCULAR | Status: AC
Start: 2017-10-25 — End: 2017-10-25
  Administered 2017-10-25: 23:00:00 4 mg via INTRAVENOUS

## 2017-10-25 MED FILL — ONDANSETRON HCL 4 MG/2ML IJ SOLN: 4 MG/2ML | INTRAMUSCULAR | Qty: 2

## 2017-10-25 NOTE — ED Notes (Signed)
Dr Era SkeenActheson at bedside.     Rollen SoxAudrey L Shantrell Placzek, RN  10/25/17 1843

## 2017-10-25 NOTE — ED Notes (Signed)
Report to Joleen, RN.     Brenda Zamora Brenda Najae Rathert, RN  10/25/17 1909

## 2017-10-25 NOTE — ED Notes (Signed)
Clean catch urine sample, blood samples and pelvic swabs sent to lab by writer.     Rollen SoxAudrey L Mert Dietrick, RN  10/25/17 [phone]

## 2017-10-25 NOTE — ED Provider Notes (Signed)
[facility] ED  Emergency Department Encounter  EmergencyMedicine Resident     Pt Name:Brenda Zamora  MRN: [id number]  Birthdate [birth year]  Date of evaluation: 10/25/17  PCP:  No primary care provider on file.    CHIEF COMPLAINT       Chief Complaint   Patient presents with   ??? Chest Pain   ??? Abdominal Pain   ??? Headache       HISTORY OF PRESENT ILLNESS  (Location/Symptom, Timing/Onset, Context/Setting, Quality, Duration, Modifying Factors, Severity.)      Brenda Zamora is a 22 y.o. female who presents with multiple complaints including abdominal pain chest pain and headache.  Patient reports onset of symptoms 2.5 hours prior to arrival.  Patient complaining of right-sided chest pain, described as sharp, radiates to the right arm.  Chest pain is not associated with shortness of breath diaphoresis.  Does complain of mild nausea.  Patient reports family history of early cardiac disease patient is claiming that had a sibling die from a heart attack at 97 years old.  Patient complaining of mild generalized headache, headache has been ongoing for several days has been intermittent, gradual in onset, not thunderclap in presentation, there is no neck pain or stiffness.  No recent URI symptoms.  Patient denying fevers, chills.  Patient denying numbness tingling or weakness in extremities, or vision change.  Patient is also complaining of abdominal pain.  Abdominal pain is diffuse across the lower abdomen,?  More severe right lower quadrant?.  Patient denying dysuria, hematuria, vaginal discharge.  Patient reporting less pressure.  7/8, believes she is several days late.     PAST MEDICAL / SURGICAL / SOCIAL / FAMILY HISTORY      has a past medical history of Anemia, Asthma, Bipolar 1 disorder (HCC), Depression, Insomnia, and PTSD (post-traumatic stress disorder).     has a past surgical history that includes Bunionectomy and Wrist surgery.    Social History     Socioeconomic History   ??? Marital status: Single      Spouse name: Not on file   ??? Number of children: Not on file   ??? Years of education: Not on file   ??? Highest education level: Not on file   Occupational History   ??? Not on file   Social Needs   ??? Financial resource strain: Not on file   ??? Food insecurity:     Worry: Not on file     Inability: Not on file   ??? Transportation needs:     Medical: Not on file     Non-medical: Not on file   Tobacco Use   ??? Smoking status: Former Smoker   ??? Smokeless tobacco: Never Used   Substance and Sexual Activity   ??? Alcohol use: Not on file   ??? Drug use: Not on file   ??? Sexual activity: Not on file   Lifestyle   ??? Physical activity:     Days per week: Not on file     Minutes per session: Not on file   ??? Stress: Not on file   Relationships   ??? Social connections:     Talks on phone: Not on file     Gets together: Not on file     Attends religious service: Not on file     Active member of club or organization: Not on file     Attends meetings of clubs or organizations: Not on file     Relationship  status: Not on file   ??? Intimate partner violence:     Fear of current or ex partner: Not on file     Emotionally abused: Not on file     Physically abused: Not on file     Forced sexual activity: Not on file   Other Topics Concern   ??? Not on file   Social History Narrative   ??? Not on file       History reviewed. No pertinent family history.    Allergies:  Rocephin [ceftriaxone]; Penicillins; Lidocaine viscous hcl; and Zithromax [azithromycin]    Home Medications:  Prior to Admission medications    Medication Sig Start Date End Date Taking? Authorizing Provider   IRON PO Take by mouth   Yes Historical Provider, MD   ondansetron (ZOFRAN-ODT) 4 MG disintegrating tablet Place 1 tablet under the tongue every 8 hours as needed for Nausea or Vomiting May Sub regular tablet (non-ODT) if insurance does not cover ODT. 10/25/17 11/01/17 Yes Newton Pigganiel Sinjin Amero, DO   metroNIDAZOLE (FLAGYL) 500 MG tablet Take 1 tablet by mouth 2 times daily Take with Food. Do  NOT drink alcohol. 10/25/17  Yes Newton Pigganiel Kinsley Holderman, DO       REVIEW OF SYSTEMS    (2-9 systems for level 4, 10 or more for level 5)      Review of Systems   Constitutional: Negative for chills and fever.   HENT: Negative for congestion, rhinorrhea and sore throat.    Eyes: Negative for photophobia and visual disturbance.   Respiratory: Negative for cough, shortness of breath and stridor.    Cardiovascular: Positive for chest pain. Negative for palpitations and leg swelling.   Gastrointestinal: Positive for abdominal pain. Negative for nausea and vomiting.   Genitourinary: Negative for decreased urine volume, dysuria and hematuria.   Musculoskeletal: Negative for back pain, neck pain and neck stiffness.   Skin: Negative for rash.   Allergic/Immunologic: Negative for environmental allergies.   Neurological: Positive for headaches. Negative for weakness and numbness.   Hematological: Negative for adenopathy.   Psychiatric/Behavioral: Negative for confusion.       PHYSICAL EXAM   (up to 7 for level 4, 8 or more for level 5)      INITIAL VITALS:   BP 113/67    Pulse 84    Temp 98.2 ??F (36.8 ??C) (Oral)    Resp 20    LMP 09/20/2017    SpO2 100%     Physical Exam   Constitutional: She is oriented to person, place, and time. No distress.   HENT:   Head: Normocephalic and atraumatic.   Eyes: Pupils are equal, round, and reactive to light. EOM are normal.   Neck: Normal range of motion. Neck supple. No tracheal deviation present.   Cardiovascular: Normal rate and regular rhythm.   Pulmonary/Chest: Effort normal. No respiratory distress.   Abdominal: Soft. She exhibits no distension. There is tenderness in the right lower quadrant, suprapubic area and left lower quadrant. There is no rigidity and no guarding.       Musculoskeletal: Normal range of motion. She exhibits no edema.   Neurological: She is alert and oriented to person, place, and time.   Skin: Skin is warm. She is not diaphoretic.       DIFFERENTIAL  DIAGNOSIS     PLAN  (LABS / IMAGING / EKG):  Orders Placed This Encounter   Procedures   ??? VAGINITIS DNA PROBE   ??? C.trachomatis N.gonorrhoeae DNA   ???  Urine Culture   ??? XR CHEST STANDARD (2 VW)   ??? US NON OB TRANSVAGINAL   ??? CBC WITH AUTO DIFFERENTIAL   ??? Basic Metabolic Panel   ??? Troponin   ??? URINALYSIS WITH MICROSCOPIC   ??? HCG Qualitative, Serum   ??? EKG 12 Lead   ??? Insert peripheral IV       MEDICATIONS ORDERED:  Orders Placed This Encounter   Medications   ??? ondansetron (ZOFRAN) injection 4 mg   ??? ketorolac (TORADOL) injection 15 mg   ??? promethazine (PHENERGAN) injection 25 mg   ??? ondansetron (ZOFRAN-ODT) 4 MG disintegrating tablet     Sig: Place 1 tablet under the tongue every 8 hours as needed for Nausea or Vomiting May Sub regular tablet (non-ODT) if insurance does not cover ODT.     Dispense:  20 tablet     Refill:  0   ??? metroNIDAZOLE (FLAGYL) 500 MG tablet     Sig: Take 1 tablet by mouth 2 times daily Take with Food. Do NOT drink alcohol.     Dispense:  14 tablet     Refill:  0   ??? metroNIDAZOLE (FLAGYL) tablet 500 mg       DIAGNOSTIC RESULTS / EMERGENCY DEPARTMENT COURSE / MDM     LABS:  Results for orders placed or performed during the hospital encounter of 10/25/17   VAGINITIS DNA PROBE   Result Value Ref Range    Specimen Description .VAGINA     Special Requests NOT REPORTED     Direct Exam POSITIVE for Gardnerella vaginalis. (A)     Direct Exam NEGATIVE for Candida sp.     Direct Exam NEGATIVE for Trichomonas vaginalis     Direct Exam       Method of testing is a DNA probe intended for detection and identification of Candida species, Gardnerella vaginalis, and Trichomonas vaginalis nucleic acid in vaginal fluid specimens from patients with symptoms of vaginitis/vaginosis.   CBC WITH AUTO DIFFERENTIAL   Result Value Ref Range    WBC 6.5 3.5 - 11.3 k/uL    RBC 4.50 3.95 - 5.11 m/uL    Hemoglobin 10.1 (L) 11.9 - 15.1 g/dL    Hematocrit 13.234.5 (L) 36.3 - 47.1 %    MCV 76.7 (L) 82.6 - 102.9 fL    MCH 22.4 (L) 25.2 - 33.5 pg     MCHC 29.3 28.4 - 34.8 g/dL    RDW 44.017.1 (H) 10.211.8 - 14.4 %    Platelets 327 138 - 453 k/uL    MPV 12.2 8.1 - 13.5 fL    NRBC Automated 0.0 0.0 per 100 WBC    Differential Type NOT REPORTED     Seg Neutrophils 40 36 - 65 %    Lymphocytes 48 (H) 24 - 43 %    Monocytes 9 3 - 12 %    Eosinophils % 2 1 - 4 %    Basophils 1 0 - 2 %    Immature Granulocytes 0 0 %    Segs Absolute 2.59 1.50 - 8.10 k/uL    Absolute Lymph # 3.15 1.10 - 3.70 k/uL    Absolute Mono # 0.56 0.10 - 1.20 k/uL    Absolute Eos # 0.13 0.00 - 0.44 k/uL    Basophils # 0.03 0.00 - 0.20 k/uL    Absolute Immature Granulocyte <0.03 0.00 - 0.30 k/uL    WBC Morphology NOT REPORTED     RBC Morphology ANISOCYTOSIS PRESENT  Platelet Estimate NOT REPORTED    Basic Metabolic Panel   Result Value Ref Range    Glucose 99 70 - 99 mg/dL    BUN 10 6 - 20 mg/dL    CREATININE 1.61 0.96 - 0.90 mg/dL    Bun/Cre Ratio NOT REPORTED 9 - 20    Calcium 9.8 8.6 - 10.4 mg/dL    Sodium 045 409 - 811 mmol/L    Potassium 3.6 (L) 3.7 - 5.3 mmol/L    Chloride 100 98 - 107 mmol/L    CO2 23 20 - 31 mmol/L    Anion Gap 13 9 - 17 mmol/L    GFR Non-African American >60 >60 mL/min    GFR African American >60 >60 mL/min    GFR Comment          GFR Staging NOT REPORTED    Troponin   Result Value Ref Range    Troponin, High Sensitivity <6 0 - 14 ng/L    Troponin T NOT REPORTED <0.03 ng/mL    Troponin Interp NOT REPORTED    URINALYSIS WITH MICROSCOPIC   Result Value Ref Range    Color, UA YELLOW YELLOW    Turbidity UA CLOUDY (A) CLEAR    Glucose, Ur NEGATIVE NEGATIVE    Bilirubin Urine NEGATIVE NEGATIVE    Ketones, Urine NEGATIVE NEGATIVE    Specific Gravity, UA 1.019 1.005 - 1.030    Urine Hgb NEGATIVE NEGATIVE    pH, UA 6.5 5.0 - 8.0    Protein, UA NEGATIVE NEGATIVE    Urobilinogen, Urine Normal Normal    Nitrite, Urine NEGATIVE NEGATIVE    Leukocyte Esterase, Urine TRACE (A) NEGATIVE    -          WBC, UA 2 TO 5 0 - 5 /HPF    RBC, UA None 0 - 4 /HPF    Casts UA  0 - 8 /LPF     0 TO 2 HYALINE  Reference range defined for non-centrifuged specimen.    Crystals UA NOT REPORTED None /HPF    Epithelial Cells UA 10 TO 20 0 - 5 /HPF    Renal Epithelial, Urine NOT REPORTED 0 /HPF    Bacteria, UA FEW (A) None    Mucus, UA NOT REPORTED None    Trichomonas, UA NOT REPORTED None    Amorphous, UA NOT REPORTED None    Other Observations UA NOT REPORTED NOT REQ.    Yeast, UA NOT REPORTED None   HCG Qualitative, Serum   Result Value Ref Range    hCG Qual NEGATIVE NEGATIVE         RADIOLOGY:  [place] NON OB TRANSVAGINAL   Final Result   Unremarkable pelvic ultrasound.         XR CHEST STANDARD (2 VW)   Final Result   Negative chest radiographs.               EKG  None    All EKG's are interpreted by the Emergency Department Physician who either signs or Co-signs this chart in the absence of a cardiologist.    EMERGENCY DEPARTMENT COURSE:  22 year old female presents with altitude of complaints including mild generalized headache, chest pain, abdominal pain.  Stable.  Patient is well-appearing.  Heart regular rate and rhythm, lungs clear to auscultation, abdomen is soft there is no rigidity, there is diffuse tenderness in lower abdomen?  More in the right lower quadrant.  Chaperoned pelvic exam was performed with female RN assisting, no  adnexal fullness, no cervical motion tenderness, cervix was not friable, there is mild whitish-gray discharge in the vault.  Will obtain CBC, BMP, troponin, EKG, chest x-ray.    Lab work reviewed, hCG negative, troponin less than 6 UA appears contaminated, hemoglobin patient is anemic, appears to be microcytic, no active bleeding.  BMP wnl.     Will obtain transvaginal ultrasound to rule out right ovarian torsion.  Patient updated on plan.  Likely discharge if imaging stable.    Patient was reevaluated prior to discharge, symptomatically she is improved.  Transvaginal ultrasound was performed does not show any evidence of ovarian torsion.  Patient to follow-up with walk-in clinic, instructed to  return to the ER for reevaluation if pain is not improving within 24 hours.    PROCEDURES:  None    CONSULTS:  None    CRITICAL CARE:  None    FINAL IMPRESSION      1. Lower abdominal pain    2. BV (bacterial vaginosis)          DISPOSITION / PLAN     DISPOSITION  dc      PATIENT REFERRED TO:  [facility] ED  [address]  [place] [address]  [phone]    If symptoms worsen, or not improving in 24 hours    New doctor  Please schedule follow-up with a new doctor.  You may present to [facility] walk-in clinic Monday-Friday, or use the attached clinic list to schedule with a new doctor.          DISCHARGE MEDICATIONS:  New Prescriptions    METRONIDAZOLE (FLAGYL) 500 MG TABLET    Take 1 tablet by mouth 2 times daily Take with Food. Do NOT drink alcohol.    ONDANSETRON (ZOFRAN-ODT) 4 MG DISINTEGRATING TABLET    Place 1 tablet under the tongue every 8 hours as needed for Nausea or Vomiting May Sub regular tablet (non-ODT) if insurance does not cover ODT.       Newton Pigg, DO  Emergency Medicine Resident    (Please note that portions of thisnote were completed with a voice recognition program.  Efforts were made to edit the dictations but occasionally words are mis-transcribed.)        Newton Pigg, DO  10/25/17 2139

## 2017-10-25 NOTE — Discharge Instructions (Signed)
Thank you for [facility].    He was seen and evaluated for chest pain, abdominal pain and headaches.  He had lab work obtained, EKG, transvaginal ultrasounds performed.  Pelvic exam was positive for BV which is treated with Flagyl.  You were treated with Flagyl and given prescription for home.  Your ultrasound showed normal uterus and ovaries.    Please schedule follow-up with [facility]      Should you have any questions regarding your care or further treatment, please call [facility] at [phone].        PLEASE RETURN TO THE ED IMMEDIATELY for worsening symptoms, including worsening abdominal pain nausea, vomiting, high fevers.  She notes you have worsening pain in your right lower abdomen, if you have pain with walking or riding in vehicles.  Return to the ER for reevaluation if your abdominal pain is not improving in 24 hours.    [facility] Clinic List    Healthcare Providers Services [facility] Hours   [facility]  [address]  [phone] Pediatric Primary Care  Adult Primary Care  OB/GYN/Prenatal/Specialty Clinics Monday - Friday  8:00a - 4:30p   [facility]  [address]  [phone] Adult Medicine, Pediatrics, OB/GYN Monday - Friday  8:30a - 4:30p   [facility]  [address]  [phone]  Wednesday 8:30a - 11:00a   [facility]  [facility]  [address] Adult Internal Medicine   [facility]  [phone]  OB/GYN Clinic  [phone]    Pediatric Clinic  [phone]   Monday, Tuesday, Thursday, Friday  8:00a - 4:30p  Wednesday 1:00p - 4:30p  Monday, Tuesday, Thursday 8:00a - 5:00p;  Friday 8:00a - 12:30p  Wednesday 1p - 4p  Monday, Tuesday, Thursday, Friday  8:30a - 4:15p  Wednesday 12:30p - 4:15p   Health Department  [facility]  [address]  [phone] Pediatric Primary Care  Adult Primary  Care  OB/Prenatal Monday - Wednesday, Friday  Monday - Friday 8a - 12p  Thursday 8a - 4:45p   Heartbeat  [address]  [phone]  [address]   [phone] Pregnancy  Pre & Post Adoption Counseling  Pregnancy Support  Prenatal / nutrition Care  Reward Incentive Program [place] Location  Mon, [place], [place], Fri 10:00a - 4:30p  Thur 10:00a - 7:30p  [facility] Location  Monday - Friday 10a - 4:30p   [facility]   OB/GYN  [address],   [address]  [phone]  Adult Internal Medicine  [address]  [phone]  Pediatrics  [address]  [phone]  Neuro / Headache  [address],   [address]  [phone] Monday - Friday  8:30a - 5p   [facility]  [address]  [phone] [facility] Mon, [place], [place], [place]  9:00a - 4:30p  Wed 1:00p - 4:30p   [facility]  [address]  [phone] Family Practice Monday - Friday  8:30a - 5:00p     [facility]   Specialty Clinics  [address], [facility] [address]  [phone] Burn/Plastic, ENT, GI, Orthopedics, Surgical / Trauma, Urology, Vascular Monday - Friday 8:00 - 4:30p    Call  for an appointment   [facility]  [address]  [phone] Adult Medicine, [facility], Dental  Patient must be certified homeless  Under age 718 not accepted Monday - Friday 8:00 - 4:30p   [facility]  [address] Family Practice  [phone]  OB   [phone] Monday, Tuesday, Wednesday, Friday 9a - 5p  Thursday 9a - 6p  Wednesday (OB only)     [facility]  [address]  [phone] OB/Prenatal Monday 11a -7p  [place]enn[place], Wed, Thur 9a - 5p  Friday 8a - 4p  1st Saturday 9a -1p   Podiatry Clinic  [address], [facility] [address]  [phone]  Monday - Friday 8:00 - 4:30 p   [facility]  [address]  [phone] Free nurse visits  [place]  programs  Counseling  Pregnancy Class Call or walk in   [facility]  [address]  [phone] Various Clinics 8a - 5:30p   [facility]  [facility]  [address], [address]  [phone] Family Practice Monday - Friday  8a - 4:30p   [facility]  [address], [place], [place] [address]  [phone]    [address], [place], [place] [address]  [phone]    Monday - Friday  8a - 4:30p    Monday - Friday 8a - 4:30p  Thursday 8a - 8p     Outpatient Clinics     [facility] Management Clinic  [facility]  [address]  [phone]  Monday - Friday  9a - 5p   Diabetic Education Services  [phone]  Call for an appointment   [facility]  Heart Failure Clinic  [address]  [phone]  Monday - Friday  8:30a - 4p   [facility]     [facility] [place]  [address]  [phone] Must have source of income and must bring (2) recent check stubs to appointment By appointment only   [facility] for [facility]  [address]  [phone] Patient must be homeless, call for   eligibility guidelines.  Under age 23 NOT accepted Days and hours vary (Doors open at 8:30a - day of week varies)  Call for an appointment   Miscellaneous Information     [employer] First Call for Help  [phone]  Call for an appointment   H.E.L.P   [facility](Hospital Eligibility [facility])  [phone]   toll free [phone] For financial assistance

## 2017-10-25 NOTE — ED Provider Notes (Signed)
[facility]  eMERGENCY dEPARTMENT eNCOUnter   Attending Attestation     Pt Name: Brenda Zamora Bonnie  MRN: [id number]  Birthdate 02/05/1996  Date of evaluation: 10/25/17       Brenda Zamora Shaw is a 22 y.o. female who presents with Chest Pain; Abdominal Pain; and Headache      History: She presents with chest pain, abdominal pain, headache. Pt states it has been there for two hours.     Exam: HRRR, lungs CTABL, abdomen soft and diffusely tender especially over the right suprapubic area.     Plan for ACS workup and abodminal pain workup including urine, pregnancy, and pelvic exam. Consider CT for appy vs u/s for torsion. Both are unlikely.     I performed a history and physical examination of the patient and discussed management with the resident. I reviewed the resident???s note and agree with the documented findings and plan of care. Any areas of disagreement are noted on the chart. I was personally present for the key portions of any procedures. I have documented in the chart those procedures where I was not present during the key portions. I have personally reviewed all images and agree with the resident's interpretation. I have reviewed the emergency nurses triage note. I agree with the chief complaint, past medical history, past surgical history, allergies, medications, social and family history as documented unless otherwise noted below. Documentation of the HPI, Physical Exam and Medical Decision Making performed by medical students or scribes is based on my personal performance of the HPI, PE and MDM. For Phys Assistant/ Nurse Practitioner cases/documentation I have had a face to face evaluation of this patient and have completed at least one if not all key elements of the E/M (history, physical exam, and MDM). Additional findings are as noted.    For APC cases I have personally evaluated and examined the patient in conjunction with the APC and agree with the treatment plan and disposition of the patient as  recorded by the APC.    Kerrie PleasureStephen Keir Foland, MD  Attending Emergency  Physician       Shon HaleStephen F Frazier Balfour, MD  10/25/17 [phone]

## 2017-10-25 NOTE — ED Notes (Signed)
Pt to and from xray via stretcher.     Rollen SoxAudrey L Osker Ayoub, RN  10/25/17 [phone]

## 2017-10-26 LAB — C.TRACHOMATIS N.GONORRHOEAE DNA
C. trachomatis DNA: NEGATIVE
N. gonorrhoeae DNA: NEGATIVE

## 2017-10-26 LAB — VAGINITIS DNA PROBE
Direct Exam: NEGATIVE
Direct Exam: NEGATIVE
Direct Exam: POSITIVE — AB

## 2017-10-26 MED ORDER — ONDANSETRON 4 MG PO TBDP
4 MG | ORAL_TABLET | Freq: Three times a day (TID) | ORAL | 0 refills | Status: AC | PRN
Start: 2017-10-26 — End: 2017-11-01

## 2017-10-26 MED ORDER — METRONIDAZOLE 500 MG PO TABS
500 MG | Freq: Once | ORAL | Status: AC
Start: 2017-10-26 — End: 2017-10-25
  Administered 2017-10-26: 02:00:00 500 mg via ORAL

## 2017-10-26 MED ORDER — KETOROLAC TROMETHAMINE 15 MG/ML IJ SOLN
15 MG/ML | Freq: Once | INTRAMUSCULAR | Status: AC
Start: 2017-10-26 — End: 2017-10-25
  Administered 2017-10-26: 15 mg via INTRAVENOUS

## 2017-10-26 MED ORDER — METRONIDAZOLE 500 MG PO TABS
500 MG | ORAL_TABLET | Freq: Two times a day (BID) | ORAL | 0 refills | Status: DC
Start: 2017-10-26 — End: 2017-12-02

## 2017-10-26 MED ORDER — PROMETHAZINE HCL 25 MG/ML IJ SOLN
25 MG/ML | Freq: Once | INTRAMUSCULAR | Status: AC
Start: 2017-10-26 — End: 2017-10-25
  Administered 2017-10-26: 01:00:00 25 mg via INTRAMUSCULAR

## 2017-10-26 MED FILL — KETOROLAC TROMETHAMINE 15 MG/ML IJ SOLN: 15 mg/mL | INTRAMUSCULAR | Qty: 1

## 2017-10-26 MED FILL — PROMETHAZINE HCL 25 MG/ML IJ SOLN: 25 mg/mL | INTRAMUSCULAR | Qty: 1

## 2017-10-26 MED FILL — METRONIDAZOLE 500 MG PO TABS: 500 mg | ORAL | Qty: 1

## 2017-10-27 LAB — EKG 12-LEAD
Atrial Rate: 87 {beats}/min
P Axis: 11 degrees
P-R Interval: 140 ms
Q-T Interval: 362 ms
QRS Duration: 70 ms
QTc Calculation (Bazett): 435 ms
R Axis: 47 degrees
T Axis: 60 degrees
Ventricular Rate: 87 {beats}/min

## 2017-10-27 LAB — CULTURE, URINE: Culture: NO GROWTH

## 2017-11-01 ENCOUNTER — Inpatient Hospital Stay
Admit: 2017-11-01 | Discharge: 2017-11-01 | Disposition: A | Discharge status: Home / Self Care | Payer: Medicaid Other | Attending: Emergency Medicine

## 2017-11-01 ENCOUNTER — Emergency Department: Admit: 2017-11-01 | Payer: Medicaid Other

## 2017-11-01 DIAGNOSIS — R42 Dizziness and giddiness: Secondary | ICD-10-CM

## 2017-11-01 LAB — URINALYSIS
Bilirubin Urine: NEGATIVE
Glucose, Ur: NEGATIVE
Ketones, Urine: NEGATIVE
Leukocyte Esterase, Urine: NEGATIVE
Nitrite, Urine: NEGATIVE
Protein, UA: NEGATIVE
Specific Gravity, UA: 1.023 (ref 1.000–1.030)
Urine Hgb: NEGATIVE
Urobilinogen, Urine: NORMAL
pH, UA: 6 (ref 5.0–8.0)

## 2017-11-01 LAB — PREGNANCY, URINE: Pregnancy, Urine: NEGATIVE

## 2017-11-01 MED ORDER — KETOROLAC TROMETHAMINE 30 MG/ML IJ SOLN
30 MG/ML | Freq: Once | INTRAMUSCULAR | Status: AC
Start: 2017-11-01 — End: 2017-11-01
  Administered 2017-11-01: 22:00:00 30 mg via INTRAMUSCULAR

## 2017-11-01 MED ORDER — MECLIZINE HCL 25 MG PO TABS
25 MG | ORAL_TABLET | Freq: Three times a day (TID) | ORAL | 0 refills | Status: AC | PRN
Start: 2017-11-01 — End: 2017-11-11

## 2017-11-01 MED FILL — KETOROLAC TROMETHAMINE 30 MG/ML IJ SOLN: 30 mg/mL | INTRAMUSCULAR | Qty: 1

## 2017-11-01 NOTE — ED Notes (Signed)
Pt given instructions for follow-up and discharge. Pt given education on prescriptions. Pt verbalizes understanding. Pt is A&O x4, PWD, eupneic, and ambulatory with steady, even gait upon discharge.      Dan EuropeErin M Schwamberger, RN  11/01/17 [phone]

## 2017-11-01 NOTE — ED Notes (Signed)
Clean catch urine sample obtained from patient and sent to lab.      Quentin CornwallSamantha E Melvena Vink, RN  11/01/17 1451

## 2017-11-01 NOTE — ED Provider Notes (Signed)
[facility]  eMERGENCY dEPARTMENT eNCOUnter      Pt Name: Brenda Zamora  MRN: [id number]  [place] August 17, 1995  Date of evaluation: 11/01/17      CHIEF COMPLAINT       Chief Complaint   Patient presents with   ??? Dizziness   ??? Headache   ??? Nausea   ??? Abdominal Cramping     HISTORY OF PRESENT ILLNESS   HPI 22 y.o. female presents to the emergency department with c/o intermittent dizziness.  Symptoms have going on for the last few months.  Symptoms occur daily.  Reports she feels like the room is spinning around.  Symptoms last for a few minutes to 10's of minutes.  She reports that she recently moved to the area; she has had multiple previous evaluations and she was told that she had vertigo.  She was given an unknown medication which "just made me sleepy", but the symptoms continue to occur.  She does not remember medication name, but does not like taking it because she has an 63 month old.  No dizziness currently, last episode earlier today.  She reports a severe, 9/10, chronic frontal headache that is always present for the last several months.   She also reports that she is 2 weeks late on her menstrual period.     REVIEW OF SYSTEMS     Review of Systems   Constitutional: Positive for fatigue. Negative for chills and fever.   HENT: Positive for sinus pressure. Negative for congestion.    Eyes: Negative for visual disturbance.   Respiratory: Negative for cough.    Cardiovascular: Negative for chest pain.   Gastrointestinal: Positive for nausea.   Genitourinary: Negative for dysuria, vaginal bleeding and vaginal discharge.   Musculoskeletal: Negative for back pain.   Skin: Negative for rash.   Neurological: Positive for dizziness.   Psychiatric/Behavioral: Negative for confusion.         PAST MEDICAL HISTORY     Past Medical History:   Diagnosis Date   ??? Anemia    ??? Asthma    ??? Bipolar 1 disorder ([place])    ??? Depression    ??? Insomnia    ??? PTSD (post-traumatic stress disorder)        SURGICAL HISTORY       Past  Surgical History:   Procedure Laterality Date   ??? BUNIONECTOMY     ??? WRIST SURGERY         CURRENT MEDICATIONS       Discharge Medication List as of 11/01/2017  6:52 PM      CONTINUE these medications which have NOT CHANGED    Details   IRON PO Take by mouthHistorical Med      ondansetron (ZOFRAN-ODT) 4 MG disintegrating tablet Place 1 tablet under the tongue every 8 hours as needed for Nausea or Vomiting May Sub regular tablet (non-ODT) if insurance does not cover ODT., Disp-20 tablet, R-0Print      metroNIDAZOLE (FLAGYL) 500 MG tablet Take 1 tablet by mouth 2 times daily Take with Food. Do NOT drink alcohol., Disp-14 tablet, R-0Print             ALLERGIES     is allergic to rocephin [ceftriaxone]; penicillins; lidocaine viscous hcl; and zithromax [azithromycin].    FAMILY HISTORY     has no family status information on file.        SOCIAL HISTORY      reports that she has quit smoking. She  has never used smokeless tobacco. She reports that she drank alcohol. She reports that she has current or past drug history. Drug: Marijuana.    PHYSICAL EXAM     INITIAL VITALS: BP 126/66    Pulse 88    Temp 98.4 ??F (36.9 ??C) (Oral)    Resp 16    Ht '5\' 2"'  (1.575 m)    Wt 165 lb (74.8 kg)    LMP 09/20/2017    SpO2 97%    BMI 30.18 kg/m??   Gen: NAD  Head: Normocephalic, atraumatic  Eye: Pupils equal round reactive to light, no conjunctivitis, no nystagmus.   ENT: MMM.  TM clear bilaterally.   Heart: Regular rate and rhythm no murmurs  Lungs: Clear to auscultation bilaterally, no respiratory distress  Chest wall: No crepitus, no tenderness palpation  Abdomen: Soft, nontender, nondistended, with no peritoneal signs  MSK: No CVA TTP  Neurologic: Patient is alert and oriented x3, motor and sensation is intact in all 4 extremities, cerebellar function is normal, fluent speech  Extremities: Full range of motion, no cyanosis, no edema, no signs of trauma, no tenderness to palpation    MEDICAL DECISION MAKING:     MDM  22 yo with  intermittent dizziness, 2 months of severe frontal chronic headache, amenorrhea.  We'll get a ct to r/o a brain tumor.  We'll check an ekg looking for signs of arrhythmia.  We'll r/o any pregnancy.  Seen in the Emergency Department on 8/12 for cp - laboratory studies from that encounter reviewed and noted below.        Hospital Course:     CT head and EKG unremarkable.  UA normal and pregnancy test negative.   Suspect that the patient is having intermittent episodes of peripheral vertigo.  Referral for PCP provided and I d/w her likely need to see an ENT given the frequency and chronicity of her symptoms. She was provided with warning precautions for ED return and our care coordinator met with her to establish a primary care physician as she is new to the area.     DIAGNOSTIC RESULTS     EKG: All EKG's are interpreted by the Emergency Department Physician who either signs or Co-signs this chart in the absence of a cardiologist.    EKG shows a sinus rhythm.  HR is 81, PR 144, QRS 72, QTC 429, no STE, No STD, No TWI, the axis is normal.          RADIOLOGY:All plain film, CT, MRI, and formal ultrasound images (except ED bedside ultrasound) are read by the radiologist and the images and interpretations are directly viewed by the emergency physician.     CT Head WO Contrast   Final Result   No acute intracranial abnormality.             LABS: All lab results were reviewed by myself, and all abnormals are listed below.  Labs Reviewed   URINALYSIS   PREGNANCY, URINE     Results for [name], [name] (MRN [id number]) as of 11/02/2017 08:50   Ref. Range 10/25/2017 18:53   WBC Latest Ref Range: 3.5 - 11.3 k/uL 6.5   RBC Latest Ref Range: 3.95 - 5.11 m/uL 4.50   Hemoglobin Quant Latest Ref Range: 11.9 - 15.1 g/dL 10.1 (L)   Hematocrit Latest Ref Range: 36.3 - 47.1 % 34.5 (L)   MCV Latest Ref Range: 82.6 - 102.9 fL 76.7 (L)   MCH Latest Ref Range: 25.2 - 33.5 pg  22.4 (L)   MCHC Latest Ref Range: 28.4 - 34.8 g/dL 29.3   MPV Latest Ref  Range: 8.1 - 13.5 fL 12.2   RDW Latest Ref Range: 11.8 - 14.4 % 17.1 (H)   Platelet Count Latest Ref Range: 138 - 453 k/uL 327     Results for BOB, EASTWOOD (MRN [id number]) as of 11/02/2017 08:50   Ref. Range 10/25/2017 18:53   WBC Latest Ref Range: 3.5 - 11.3 k/uL 6.5   RBC Latest Ref Range: 3.95 - 5.11 m/uL 4.50   Hemoglobin Quant Latest Ref Range: 11.9 - 15.1 g/dL 10.1 (L)   Hematocrit Latest Ref Range: 36.3 - 47.1 % 34.5 (L)   MCV Latest Ref Range: 82.6 - 102.9 fL 76.7 (L)   MCH Latest Ref Range: 25.2 - 33.5 pg 22.4 (L)   MCHC Latest Ref Range: 28.4 - 34.8 g/dL 29.3   MPV Latest Ref Range: 8.1 - 13.5 fL 12.2   RDW Latest Ref Range: 11.8 - 14.4 % 17.1 (H)   Platelet Count Latest Ref Range: 138 - 453 k/uL 327       EMERGENCY DEPARTMENT COURSE:   Vitals:    Vitals:    11/01/17 1419 11/01/17 1531   BP: 114/66 126/66   Pulse: 110 88   Resp: 14 16   Temp: 98.4 ??F (36.9 ??C)    TempSrc: Oral    SpO2: 100% 97%   Weight: 165 lb (74.8 kg)    Height: '5\' 2"'  (1.575 m)        The patient was given the following medications while in the emergency department:  Orders Placed This Encounter   Medications   ??? ketorolac (TORADOL) injection 30 mg   ??? meclizine (ANTIVERT) 25 MG tablet     Sig: Take 1 tablet by mouth 3 times daily as needed for Dizziness     Dispense:  30 tablet     Refill:  0     -------------------------  CRITICAL CARE:   CONSULTS: None  PROCEDURES: Procedures     FINAL IMPRESSION      1. Vertigo          DISPOSITION/PLAN   DISPOSITION Decision To Discharge 11/01/2017 06:14:38 PM      PATIENT REFERRED TO:  Alanson Aly, MD   [place] [address]  [phone]    On 11/04/2017  For post hospital follw-up appointment.Time:11:15 AM       DISCHARGE MEDICATIONS:  Discharge Medication List as of 11/01/2017  6:52 PM      START taking these medications    Details   meclizine (ANTIVERT) 25 MG tablet Take 1 tablet by mouth 3 times daily as needed for Dizziness, Disp-30 tablet, R-0Print               Darrick Grinder,  MD  Attending Emergency Physician                      Darrick Grinder, MD  11/02/17 [phone]

## 2017-11-04 ENCOUNTER — Encounter: Payer: Medicaid Other | Attending: Internal Medicine

## 2017-11-08 LAB — EKG 12-LEAD
Atrial Rate: 81 {beats}/min
P Axis: 5 degrees
P-R Interval: 144 ms
Q-T Interval: 370 ms
QRS Duration: 72 ms
QTc Calculation (Bazett): 429 ms
R Axis: 55 degrees
T Axis: 54 degrees
Ventricular Rate: 81 {beats}/min

## 2017-11-18 ENCOUNTER — Encounter

## 2017-11-18 ENCOUNTER — Inpatient Hospital Stay: Payer: Medicaid Other

## 2017-11-18 ENCOUNTER — Inpatient Hospital Stay: Admit: 2017-11-18 | Payer: Medicaid Other

## 2017-11-18 ENCOUNTER — Inpatient Hospital Stay
Admit: 2017-11-18 | Discharge: 2017-11-18 | Disposition: A | Discharge status: Home / Self Care | Payer: Medicaid Other | Attending: Emergency Medicine

## 2017-11-18 DIAGNOSIS — R42 Dizziness and giddiness: Secondary | ICD-10-CM

## 2017-11-18 DIAGNOSIS — J029 Acute pharyngitis, unspecified: Secondary | ICD-10-CM

## 2017-11-18 LAB — CBC WITH AUTO DIFFERENTIAL
Basophils %: 0 % (ref 0–2)
Basophils Absolute: <0.03 10*3/uL (ref 0.00–0.20)
Eosinophils %: 1 % (ref 1–4)
Eosinophils Absolute: 0.04 10*3/uL (ref 0.00–0.44)
Hematocrit: 31.9 % — ABNORMAL LOW (ref 36.3–47.1)
Hemoglobin: 9.1 g/dL — ABNORMAL LOW (ref 11.9–15.1)
Immature Granulocytes %: 0 %
Immature Granulocytes Absolute: <0.03 10*3/uL (ref 0.00–0.30)
Lymphocytes Absolute: 2.55 10*3/uL (ref 1.10–3.70)
Lymphocytes: 48 % — ABNORMAL HIGH (ref 24–43)
MCH: 22.1 pg — ABNORMAL LOW (ref 25.2–33.5)
MCHC: 28.5 g/dL (ref 28.4–34.8)
MCV: 77.4 fL — ABNORMAL LOW (ref 82.6–102.9)
MPV: 12 fL (ref 8.1–13.5)
Monocytes %: 7 % (ref 3–12)
Monocytes Absolute: 0.39 10*3/uL (ref 0.10–1.20)
NRBC Automated: 0 per 100 WBC
Neutrophils Absolute: 2.33 10*3/uL (ref 1.50–8.10)
Platelets: 329 10*3/uL (ref 138–453)
RBC: 4.12 m/uL (ref 3.95–5.11)
RDW: 17.7 % — ABNORMAL HIGH (ref 11.8–14.4)
Seg Neutrophils: 44 % (ref 36–65)
WBC: 5.3 10*3/uL (ref 3.5–11.3)

## 2017-11-18 LAB — LIPID, FASTING
Chol/HDL Ratio: 3 (ref <5)
Cholesterol, Fasting: 154 mg/dL (ref <200)
HDL: 52 mg/dL (ref >40)
LDL Cholesterol: 90 mg/dL (ref 0–130)
Triglyceride, Fasting: 61 mg/dL (ref <150)

## 2017-11-18 LAB — COMPREHENSIVE METABOLIC PANEL
ALT: 11 U/L (ref 5–33)
AST: 18 U/L (ref <32)
Albumin/Globulin Ratio: 1.2 (ref 1.0–2.5)
Albumin: 4.2 g/dL (ref 3.5–5.2)
Alkaline Phosphatase: 85 U/L (ref 35–104)
Anion Gap: 13 mmol/L (ref 9–17)
BUN: 9 mg/dL (ref 6–20)
CO2: 23 mmol/L (ref 20–31)
Calcium: 9.4 mg/dL (ref 8.6–10.4)
Chloride: 104 mmol/L (ref 98–107)
Creatinine: 0.87 mg/dL (ref 0.50–0.90)
GFR African American: >60 mL/min (ref >60)
GFR Non-African American: >60 mL/min (ref >60)
Glucose: 83 mg/dL (ref 70–99)
Potassium: 4 mmol/L (ref 3.7–5.3)
Sodium: 140 mmol/L (ref 135–144)
Total Bilirubin: 0.26 mg/dL — ABNORMAL LOW (ref 0.3–1.2)
Total Protein: 7.6 g/dL (ref 6.4–8.3)

## 2017-11-18 LAB — VITAMIN D 25 HYDROXY: Vit D, 25-Hydroxy: 17.2 ng/mL — ABNORMAL LOW (ref 30.0–100.0)

## 2017-11-18 LAB — STREP SCREEN GROUP A THROAT

## 2017-11-18 LAB — TSH: TSH: 1.85 mIU/L (ref 0.30–5.00)

## 2017-11-18 LAB — T4, FREE: Thyroxine, Free: 1.09 ng/dL (ref 0.93–1.70)

## 2017-11-18 NOTE — ED Provider Notes (Signed)
[facility]     Emergency Department     Faculty Note/ Attestation      Pt Name: Brenda Zamora                                       MRN: [id number]  Birthdate January 27, 1996  Date of evaluation: 11/18/2017    Patients PCP:    No primary care provider on file.    Attestation  I performed a history and physical examination of the patient and discussed management with the resident. I reviewed the resident???s note and agree with the documented findings and plan of care. Any areas of disagreement are noted on the chart. I was personally present for the key portions of any procedures. I have documented in the chart those procedures where I was not present during the key portions. I have reviewed the emergency nurses triage note. I agree with the chief complaint, past medical history, past surgical history, allergies, medications, social and family history as documented unless otherwise noted below.    For Physician Assistant/ Nurse Practitioner cases/documentation I have personally evaluated this patient and have completed at least one if not all key elements of the E/M (history, physical exam, and MDM). Additional findings are as noted.    Initial Screens:             Vitals:    Vitals:    11/18/17 1351   BP: 129/75   Pulse: 89   Resp: 16   Temp: 99.1 ??F (37.3 ??C)   TempSrc: Oral   SpO2: 100%   Weight: 149 lb (67.6 kg)   Height: 5\' 2"  (1.575 m)       CHIEF COMPLAINT       Chief Complaint   Patient presents with   ??? Pharyngitis     complaining of sorethroat, pain with swallowing        The pt has chronic tonsilitis most recent episode prior to this was a few months ago the pt has sore throat now for 2 weeks.  Getting worse no difficulty speaking talking swallowing breathing no neck swelling    DIAGNOSTIC RESULTS     RADIOLOGY:   No orders to display       LABS:  Labs Reviewed   STREP SCREEN GROUP A THROAT       EMERGENCY DEPARTMENT COURSE:     -------------------------  BP: 129/75, Temp: 99.1 ??F (37.3 ??C),  Pulse: 89, Resp: 16  Physical Exam   Constitutional: She is oriented to person, place, and time. She appears well-developed and well-nourished.   HENT:   Head: Normocephalic and atraumatic.   Right Ear: External ear normal.   Left Ear: External ear normal.    The patient has no uvular deviation, no palatal elevation, no difficulty speaking, no trismus, no muffled voice, no tongue swelling, no tonsillar abscess.       Eyes: Right eye exhibits no discharge. Left eye exhibits no discharge. No scleral icterus.   Neck: Normal range of motion. No tracheal deviation present.   Cardiovascular: Normal rate.   Pulmonary/Chest: Effort normal. No stridor. No respiratory distress.   Musculoskeletal: Normal range of motion.   Neurological: She is alert and oriented to person, place, and time. Coordination normal.   Skin: Skin is warm and dry. She is not diaphoretic.   Psychiatric: She has a normal mood and affect. Her behavior  is normal.         Comments  The patient complains of sore throat which was evaluated for concerns of serious pathology that included:  Ludwig's Angina  Retropharyngeal abscess  Epiglottitis  Peritonsillar abscess  Strep pharyngitis  Foreign body    Based on history and physical exam they are unlikely to have need to be of can be safely discharged with symptomatic treatment and follow-up with her primary care who she says she has that she just moved here.        Vickie Epley,, DO, RDMS.  Attending Emergency Physician          Vickie Epley, DO  11/18/17 1421

## 2017-11-18 NOTE — Discharge Instructions (Addendum)
Call today or tomorrow to follow up with your primary care provider in 4-5 days.    Mix 1 teaspoon of maalox and 1 teaspoon of liquid benadryl, gargle for 1 minute then swallow.  You can also use Cepacol lozenge or Chloraseptic throat spray to help soothe your throat.    If you were diagnosed with strep throat, make sure that you throw your toothbrush away.    Return to the Emergency Department for fever > 101.5, difficulty with swallowing foods or liquids, excessive nausea or vomiting, difficulty with breathing, any other care or concern.

## 2017-11-18 NOTE — ED Provider Notes (Signed)
[facility] ED  Emergency Department Encounter   Emergency Medicine Resident     Pt Name: Brenda Zamora  MRN: [id number]  Birthdate 01-19-1996  Date of evaluation: 11/18/17  PCP:  No primary care provider on file.    CHIEF COMPLAINT       Chief Complaint   Patient presents with   ??? Pharyngitis     complaining of sorethroat, pain with swallowing        HISTORY OF PRESENT ILLNESS  (Location/Symptom, Timing/Onset, Context/Setting, Quality,Duration, Modifying Factors, Severity.)      Brenda Zamora is a 22 y.o. female who presents with sore throat for the past 2 weeks. She reports several  Episodes of tonsillitis over the past year but has not been to see an ENT yet. Denies fever, shortness of breath, neck rigidity, or choking.    PAST MEDICAL / SURGICAL / SOCIAL / FAMILY HISTORY      has a past medical history of Anemia, Asthma, Bipolar 1 disorder (HCC), Depression, Insomnia, and PTSD (post-traumatic stress disorder).     has a past surgical history that includes Bunionectomy and Wrist surgery.    Social History     Socioeconomic History   ??? Marital status: Single     Spouse name: Not on file   ??? Number of children: Not on file   ??? Years of education: Not on file   ??? Highest education level: Not on file   Occupational History   ??? Not on file   Social Needs   ??? Financial resource strain: Not on file   ??? Food insecurity:     Worry: Not on file     Inability: Not on file   ??? Transportation needs:     Medical: Not on file     Non-medical: Not on file   Tobacco Use   ??? Smoking status: Former Smoker   ??? Smokeless tobacco: Never Used   Substance and Sexual Activity   ??? Alcohol use: Not Currently   ??? Drug use: Yes     Types: Marijuana   ??? Sexual activity: Not on file   Lifestyle   ??? Physical activity:     Days per week: Not on file     Minutes per session: Not on file   ??? Stress: Not on file   Relationships   ??? Social connections:     Talks on phone: Not on file     Gets together: Not on file     Attends religious  service: Not on file     Active member of club or organization: Not on file     Attends meetings of clubs or organizations: Not on file     Relationship status: Not on file   ??? Intimate partner violence:     Fear of current or ex partner: Not on file     Emotionally abused: Not on file     Physically abused: Not on file     Forced sexual activity: Not on file   Other Topics Concern   ??? Not on file   Social History Narrative   ??? Not on file       History reviewed. No pertinent family history.    Allergies:  Rocephin [ceftriaxone]; Penicillins; Lidocaine viscous hcl; and Zithromax [azithromycin]    Home Medications:  Prior to Admission medications    Medication Sig Start Date End Date Taking? Authorizing Provider   IRON PO Take by mouth    Historical  Provider, MD   metroNIDAZOLE (FLAGYL) 500 MG tablet Take 1 tablet by mouth 2 times daily Take with Food. Do NOT drink alcohol. 10/25/17   Newton Pigg, DO       REVIEW OF SYSTEMS    (2-9 systems for level 4, 10 or more for level 5)      Review of Systems   Constitutional: Negative for appetite change, chills, fever and unexpected weight change.   HENT: Positive for congestion, sneezing and trouble swallowing. Negative for ear pain, postnasal drip, rhinorrhea, sinus pressure, sinus pain and sore throat.    Eyes: Negative for pain, redness and visual disturbance.   Respiratory: Negative for cough, chest tightness, shortness of breath and wheezing.    Cardiovascular: Negative for chest pain, palpitations and leg swelling.   Gastrointestinal: Negative for abdominal pain, blood in stool, constipation, diarrhea, nausea and vomiting.   Endocrine: Negative for polydipsia and polyuria.   Genitourinary: Negative for difficulty urinating, dysuria, frequency, hematuria, menstrual problem, pelvic pain, vaginal bleeding and vaginal discharge.   Musculoskeletal: Negative for arthralgias, myalgias and neck stiffness.   Skin: Negative for pallor and rash.   Allergic/Immunologic:  Negative for immunocompromised state.   Neurological: Negative for dizziness, syncope, facial asymmetry, speech difficulty, weakness and numbness.   Psychiatric/Behavioral: Negative for confusion. The patient is not nervous/anxious.        PHYSICAL EXAM   (up to 7 for level 4, 8 or more for level 5)      INITIAL VITALS:   BP 129/75    Pulse 89    Temp 99.1 ??F (37.3 ??C) (Oral)    Resp 16    Ht 5\' 2"  (1.575 m)    Wt 149 lb (67.6 kg)    LMP 09/20/2017    SpO2 100%    BMI 27.25 kg/m??     Physical Exam   Constitutional: She is oriented to person, place, and time. She appears well-developed and well-nourished. No distress.   HENT:   Head: Normocephalic.   Right Ear: External ear normal.   Left Ear: External ear normal.   Mouth/Throat: Posterior oropharyngeal erythema present. No oropharyngeal exudate or tonsillar abscesses. Tonsils are 3+ on the right. Tonsils are 3+ on the left.   Eyes: Pupils are equal, round, and reactive to light. EOM are normal. Right eye exhibits no discharge. Left eye exhibits no discharge. No scleral icterus.   Neck: Normal range of motion. Neck supple. No JVD present.   Cardiovascular: Normal rate and regular rhythm. Exam reveals no gallop and no friction rub.   No murmur heard.  Pulmonary/Chest: Effort normal and breath sounds normal. No stridor. No respiratory distress. She has no wheezes. She has no rales. She exhibits no tenderness.   Abdominal: Soft. She exhibits no distension. There is no tenderness. There is no rebound and no guarding.   Musculoskeletal: Normal range of motion. She exhibits no edema, tenderness or deformity.   Neurological: She is alert and oriented to person, place, and time. She exhibits normal muscle tone. Coordination normal.   Skin: Skin is warm and dry. Capillary refill takes less than 2 seconds. No rash noted. She is not diaphoretic. No erythema. No pallor.   Psychiatric: She has a normal mood and affect. Her behavior is normal. Thought content normal.         DIAGNOSIS     PLAN (LABS / IMAGING / EKG):  Orders Placed This Encounter   Procedures   ??? Strep Screen Group A Throat   ???  Inpatient consult to Ophthalmology       MEDICATIONS ORDERED:  No orders of the defined types were placed in this encounter.      DDX: Epiglottitis, peritonsilar abscess, strep pharyngitis, uvulitis, post-nasal drip, GC / Chl, viral pharyngitis, dental abscess, hand-foot-mouth disease, herpetic stomatitis, other viral infections    Evaluate for: fever, cough, abdominal pain, exudates, uvula midline, voice changes, tender cervical nodes, drooling.       DIAGNOSTIC RESULTS / EMERGENCY DEPARTMENT COURSE / MDM     LABS:  Results for orders placed or performed during the hospital encounter of 11/18/17   Strep Screen Group A Throat   Result Value Ref Range    Specimen Description .THROAT     Special Requests NOT REPORTED     Direct Exam       Rapid Strep A negative. A negative Rapid Group A Strep Screen result does not rule out the possibility of Group A Streptococci in the specimen. A Group A Strep DNA test is available upon request.       IMPRESSION: Negative      RADIOLOGY:  Xr Chest Standard (2 Vw)    Result Date: 10/25/2017  EXAMINATION: TWO XRAY VIEWS OF THE CHEST 10/25/2017 7:03 pm COMPARISON: None. HISTORY: ORDERING SYSTEM PROVIDED HISTORY: Right sided CP TECHNOLOGIST PROVIDED HISTORY: Right sided CP Reason for Exam: right sided chest pain Acuity: Unknown Type of Exam: Unknown FINDINGS: Lungs are clear.  No cardiomegaly.  No pulmonary edema.     Negative chest radiographs.     Xr Abdomen (kub) (single Ap View)    Result Date: 11/18/2017  EXAMINATION: ONE SUPINE XRAY VIEW(S) OF THE ABDOMEN 11/18/2017 1:22 pm COMPARISON: None. HISTORY: ORDERING SYSTEM PROVIDED HISTORY: Dizziness and giddiness TECHNOLOGIST PROVIDED HISTORY: Reason for Exam: LLQ pain off and on for months, nausea FINDINGS: Mild fecal stasis in the colon with a nonspecific nonobstructive bowel gas pattern.  No unusual calcifications.   Transitional segment at the lumbosacral junction with no acute osseous abnormality.     Nonspecific nonobstructive bowel gas pattern.     Ct Head Wo Contrast    Result Date: 11/01/2017  EXAMINATION: CT OF THE HEAD WITHOUT CONTRAST  11/01/2017 5:26 pm TECHNIQUE: CT of the head was performed without the administration of intravenous contrast. Dose modulation, iterative reconstruction, and/or weight based adjustment of the mA/kV was utilized to reduce the radiation dose to as low as reasonably achievable. COMPARISON: None. HISTORY: ORDERING SYSTEM PROVIDED HISTORY: headache, dizziness TECHNOLOGIST PROVIDED HISTORY: Reason for Exam: ha and dizzy 2 1/2 months Acuity: Unknown Type of Exam: Unknown FINDINGS: BRAIN/VENTRICLES: There is no acute intracranial hemorrhage, mass effect or midline shift.  No abnormal extra-axial fluid collection.  The gray-white differentiation is maintained without evidence of an acute infarct.  There is no evidence of hydrocephalus. ORBITS: The visualized portion of the orbits demonstrate no acute abnormality. SINUSES: The visualized paranasal sinuses and mastoid air cells demonstrate no acute abnormality. SOFT TISSUES/SKULL:  No acute abnormality of the visualized skull or soft tissues.     No acute intracranial abnormality.     [place] Non Ob Transvaginal    Result Date: 10/25/2017  EXAMINATION: PELVIC ULTRASOUND 10/25/2017 TECHNIQUE: Transvaginal pelvic ultrasound was performed.  Color Doppler evaluation was performed. COMPARISON: None HISTORY: ORDERING SYSTEM PROVIDED HISTORY: RLQ FINDINGS: Measurements: Uterus:  8.5 cm x 4.9 cm x 4.4 cm Endometrial stripe:  8 mm Right Ovary:  3.2 cm x 3.2 cm x 2.5 cm Left Ovary:  3.2 cm x 2.4 cm  x 1.5 cm Ultrasound Findings: Uterus: Uterus demonstrates normal myometrial echotexture. Endometrial stripe: Endometrial stripe is within normal limits. Right Ovary: Right ovary is within normal limits.  There are small follicles. There is normal arterial and venous doppler  flow. Left Ovary:  Left ovary is within normal limits. There is normal arterial and venous doppler flow. Free Fluid: No evidence of free fluid.     Unremarkable pelvic ultrasound.        EKG  None    All EKG's are interpreted by the Emergency Department Physician who either signs or Co-signs this chart in the absence of a cardiologist.    EMERGENCY DEPARTMENT COURSE:  Seen face to face, VSS, she appears non-toxic and non-distressed.     Erythematous and enlarged tonsils on physical exam. No exudate. Will swab for strep.    Rapid strep negative. Will discharge patient with contact information for ENT clinic and have her follow-up with her PCP.    Patient verbalizes agreement with discharge plan.    PROCEDURES:  None    CONSULTS:  None    CRITICAL CARE:  None    FINAL IMPRESSION      1. Acute pharyngitis, unspecified etiology          DISPOSITION / PLAN     DISPOSITION Decision To Discharge 11/18/2017 02:50:32 PM      PATIENT REFERRED TO:  [facility]  [address] [place] [address]  [phone]  Schedule an appointment as soon as possible for a visit in 1 week  For re-evaluation      DISCHARGE MEDICATIONS:  Discharge Medication List as of 11/18/2017  3:12 PM          Elwanda Brooklyn, DO  Emergency Medicine Resident    (Please note that portions of this note were completed with a voice recognition program.  Efforts were made to edit the dictations but occasionally words are mis-transcribed.)        Elwanda [place], DO  Resident  11/19/17 0719       Elwanda Brooklyn, DO  Resident  11/19/17 [phone]

## 2017-11-18 NOTE — ED Notes (Signed)
Pt arrives with a complaint of a sore throat. She states she has had throat pain "for a couple weeks". She states she has had a history of" tonsil irration". Pt states she has pain with swallowing but no difficulty.     Brenda Zamora Brenda Zamora  11/18/17 1359

## 2017-11-18 NOTE — Progress Notes (Signed)
I assigned myself to this patient's chart in error, I have neither evaluated nor treated this patient.    Electronically signed by Paulino Rily, MD on 11/18/17 at 3:48 PM

## 2017-11-25 NOTE — Telephone Encounter (Signed)
NP packet mailed.

## 2017-12-02 ENCOUNTER — Emergency Department: Admit: 2017-12-02 | Payer: Medicaid Other

## 2017-12-02 ENCOUNTER — Inpatient Hospital Stay
Admit: 2017-12-02 | Discharge: 2017-12-03 | Disposition: A | Discharge status: Home / Self Care | Payer: Medicaid Other | Attending: Emergency Medicine

## 2017-12-02 DIAGNOSIS — R1030 Lower abdominal pain, unspecified: Secondary | ICD-10-CM

## 2017-12-02 LAB — VAGINITIS DNA PROBE
Direct Exam: NEGATIVE
Direct Exam: NEGATIVE
Direct Exam: NEGATIVE

## 2017-12-02 LAB — T. PALLIDUM AB: T. pallidum, IgG: NONREACTIVE

## 2017-12-02 LAB — URINALYSIS WITH MICROSCOPIC
Bilirubin Urine: NEGATIVE
Casts UA: 0 /LPF (ref 0–8)
Epithelial Cells, UA: 2 /[HPF] (ref 0–5)
Glucose, Ur: NEGATIVE
Ketones, Urine: NEGATIVE
Leukocyte Esterase, Urine: NEGATIVE
Nitrite, Urine: NEGATIVE
Protein, UA: NEGATIVE
Specific Gravity, UA: 1.02 (ref 1.005–1.030)
Urine Hgb: NEGATIVE
Urobilinogen, Urine: NORMAL
WBC, UA: 0 /HPF (ref 0–5)
pH, UA: 7 (ref 5.0–8.0)

## 2017-12-02 LAB — HCG, QUANTITATIVE, PREGNANCY: hCG Quant: 236 IU/L — ABNORMAL HIGH (ref <5)

## 2017-12-02 LAB — HIV SCREEN: HIV Ag/Ab: NONREACTIVE

## 2017-12-02 NOTE — ED Notes (Signed)
Pt resting on cot awaiting US results and further plan of care, NAD noted, will continue to monitor.       Sebastian AcheRenee Garn, RN  12/02/17 2013

## 2017-12-02 NOTE — ED Provider Notes (Signed)
Patient accepted at shift change.  The patient's ED course and anticipated disposition was discussed.  Relevant labs and other studies were reviewed.  Pending diagnostic and therapeutic workup was discussed.    Ultrasound negative both ectopic and intrauterine pregnancy.  Given that the patient is positive patency test with no IUP identified we will discuss with OB.  Patient with minimal tenderness.  Anticipate follow-up with OB in expeditious manner.     Francena Hanlyavid J Silvana Holecek, MD  12/02/17 [phone]

## 2017-12-02 NOTE — Discharge Instructions (Signed)
[facility] tomorrow morning and set up an appointment for Saturday, 12/04/2017  It is imperative that you follow-up with the OB clinic this coming Saturday.  If you are unable to follow-up with the OB clinic this coming Saturday, please return to the emergency room to follow-up your hCG levels  Please bring your discharge instructions with you.

## 2017-12-02 NOTE — ED Provider Notes (Signed)
[facility]  Emergency Department Encounter  Emergency Medicine Resident     Pt Name: Brenda Zamora  MRN: [id number]  Birthdate 1995/08/25  Date of evaluation: 12/02/17  PCP:  No primary care provider on file.    CHIEF COMPLAINT       Chief Complaint   Patient presents with   ??? Pregnancy Test     positive pregnancy test at [facility], and at home monday afterwards    ??? Abdominal Pain     lower abdominal cramping   ??? Vaginal Bleeding     spotting        HISTORY OF PRESENT ILLNESS  (Location/Symptom, Timing/Onset, Context/Setting, Quality, Duration, Modifying Factors, Severity.)    Brenda Zamora is a 22 y.o. female who presents with lower abdominal cramping.  Patient states that she had a positive pregnancy test this past Monday.  Patient also reporting intermittent spotting ongoing for the past 2 weeks.  States her last period was about 4 to 5 weeks ago.  States she is sexually active with her husband.  Denies any vaginal discharge or vaginal pain.  Denies any dysuria or hematuria.  Endorses previous history of miscarriage secondary to ectopic pregnancy    PAST MEDICAL / SURGICAL / SOCIAL / FAMILY HISTORY    has a past medical history of Anemia, Asthma, Bipolar 1 disorder (HCC), Depression, Insomnia, and PTSD (post-traumatic stress disorder).     has a past surgical history that includes Bunionectomy and Wrist surgery.    Social History     Socioeconomic History   ??? Marital status: Single     Spouse name: Not on file   ??? Number of children: Not on file   ??? Years of education: Not on file   ??? Highest education level: Not on file   Occupational History   ??? Not on file   Social Needs   ??? Financial resource strain: Not on file   ??? Food insecurity:     Worry: Not on file     Inability: Not on file   ??? Transportation needs:     Medical: Not on file     Non-medical: Not on file   Tobacco Use   ??? Smoking status: Former Smoker   ??? Smokeless tobacco: Never Used   Substance and Sexual Activity   ??? Alcohol  use: Not Currently   ??? Drug use: Yes     Types: Marijuana   ??? Sexual activity: Not on file   Lifestyle   ??? Physical activity:     Days per week: Not on file     Minutes per session: Not on file   ??? Stress: Not on file   Relationships   ??? Social connections:     Talks on phone: Not on file     Gets together: Not on file     Attends religious service: Not on file     Active member of club or organization: Not on file     Attends meetings of clubs or organizations: Not on file     Relationship status: Not on file   ??? Intimate partner violence:     Fear of current or ex partner: Not on file     Emotionally abused: Not on file     Physically abused: Not on file     Forced sexual activity: Not on file   Other Topics Concern   ??? Not on file   Social History Narrative   ???  Not on file       History reviewed. No pertinent family history.    Allergies:    Rocephin [ceftriaxone]; Penicillins; Lidocaine viscous hcl; and Zithromax [azithromycin]    Home Medications:  Prior to Admission medications    Medication Sig Start Date End Date Taking? Authorizing Provider   IRON PO Take by mouth    Historical Provider, MD       REVIEW OF SYSTEMS    (2-9 systems for level 4, 10 or more for level 5)    Review of Systems   Constitutional: Negative for chills, fatigue and fever.   HENT: Negative for congestion, sore throat and trouble swallowing.    Respiratory: Negative for cough, shortness of breath and wheezing.    Cardiovascular: Negative for chest pain and palpitations.   Gastrointestinal: Negative for abdominal distention, abdominal pain, nausea and vomiting.   Genitourinary: Positive for pelvic pain. Negative for dysuria, hematuria, vaginal bleeding, vaginal discharge and vaginal pain.        Endorses vaginal cramping   Musculoskeletal: Negative for back pain and neck stiffness.   Skin: Negative for pallor.   Neurological: Negative for dizziness and weakness.   Psychiatric/Behavioral: Negative for confusion. The patient is not  nervous/anxious.    All other systems reviewed and are negative.      PHYSICAL EXAM   (up to 7 for level 4, 8 or more for level 5)    INITIAL VITALS:   ED Triage Vitals   BP Temp Temp Source Pulse Resp SpO2 Height Weight   12/02/17 1325 12/02/17 1325 12/02/17 1325 12/02/17 1321 12/02/17 1325 12/02/17 1321 12/02/17 1321 12/02/17 1321   114/60 98.6 ??F (37 ??C) Oral 111 17 100 % 5\' 2"  (1.575 m) 146 lb (66.2 kg)       Physical Exam   Constitutional: She is oriented to person, place, and time. She appears well-developed and well-nourished. She does not appear ill. No distress.   HENT:   Head: Normocephalic.   Eyes: Pupils are equal, round, and reactive to light. EOM are normal.   Cardiovascular: Regular rhythm, normal heart sounds and intact distal pulses.   No murmur heard.  Pulses:       Carotid pulses are 2+ on the right side, and 2+ on the left side.       Radial pulses are 2+ on the right side, and 2+ on the left side.   Pulmonary/Chest: Effort normal and breath sounds normal. No respiratory distress. She has no decreased breath sounds.   Abdominal: Soft. Normal appearance and bowel sounds are normal. She exhibits no distension. There is tenderness in the suprapubic area.   Genitourinary: Pelvic exam was performed with patient supine. Cervix exhibits discharge. Cervix exhibits no motion tenderness. Right adnexum displays no tenderness. Left adnexum displays no tenderness. No bleeding in the vagina. Vaginal discharge found.   Genitourinary Comments: Thick white discharge noted in the vaginal canal and at the cervix   Lymphadenopathy:     She has no cervical adenopathy.   Neurological: She is alert and oriented to person, place, and time.   Skin: Skin is warm and dry. Capillary refill takes less than 2 seconds.   Nursing note and vitals reviewed.      DIFFERENTIAL  DIAGNOSIS   PLAN (LABS / IMAGING / EKG):  Orders Placed This Encounter   Procedures   ??? Urine Culture   ??? C.trachomatis N.gonorrhoeae DNA   ??? VAGINITIS DNA  PROBE   ??? US OB  TRANSVAGINAL   ??? HCG, Quantitative, Pregnancy   ??? Urinalysis with microscopic   ??? HIV Screen   ??? T. pallidum Ab   ??? Vaginal exam   ??? Inpatient consult to Obstetrics / Gynecology       MEDICATIONS ORDERED:  No orders of the defined types were placed in this encounter.        DIAGNOSTIC RESULTS / EMERGENCYDEPARTMENT COURSE / MDM   LABS:  Labs Reviewed   HCG, QUANTITATIVE, PREGNANCY - Abnormal; Notable for the following components:       Result Value    hCG Quant 236 (*)     All other components within normal limits   URINALYSIS WITH MICROSCOPIC - Abnormal; Notable for the following components:    Bacteria, UA FEW (*)     All other components within normal limits   VAGINITIS DNA PROBE   URINE CULTURE   C.TRACHOMATIS N.GONORRHOEAE DNA   HIV SCREEN   T. PALLIDUM AB       RADIOLOGY:  US Ob Transvaginal    Result Date: 12/02/2017  EXAMINATION: FIRST TRIMESTER OBSTETRIC ULTRASOUND 12/02/2017 TECHNIQUE: Transvaginal first trimester obstetric pelvic ultrasound was performed with color Doppler flow evaluation. COMPARISON: None HISTORY: ORDERING SYSTEM PROVIDED HISTORY: concern for ectopic HCG of 236. FINDINGS: Uterus: Uterus measures 8.8 x 5.6 x 4.4 cm.  Endometrial thickness is 1.0 cm. Tiny amount of fluid within the endometrium. Gestational Sac(s):  Not identified. Yolk Sac:  Not identified. Fetal Pole:  Not identified. Crown Rump Length:  Not measured. Fetal Heart Rate:  Not measured. Right ovary: 3.4 x 2.5 x 2.1 cm; nonvascular hyperechoic structure in the right adnexa, possibly a cyst. Left ovary: 2.1 x 1.5 x 1.3 cm Free fluid: None     No evidence of intrauterine pregnancy or ectopic pregnancy.  Clinical follow-up is advised with repeat imaging and serial hCG.       Impression:  Patient presenting with suprapubic tenderness and cramping.  Concern for urinary tract infection, possible vaginal infection.  Patient has a positive pregnancy.  Has a history of miscarriage secondary to ectopic.  Will perform  vaginal exam and will get informal ultrasound bedside.  If unable to see IUP, will get formal ultrasound.      EMERGENCY DEPARTMENT COURSE:  ED Course as of Dec 02 2024   Thu Dec 02, 2017   1959 Spoke with [facility] team, stated they would be happy to see patient in 48 hours and to recheck her hCG.    [AP]      ED Course User Index  [AP] Heinz Knuckles, MD   Exam noted thick white discharge in vaginal canal and cervix.  Vaginitis exam however was negative for trichomonas, BV, Candida.  Awaiting formal ultrasound.    Attempted to contact ultrasound several times, ultrasound is not called back or received busy signal.  Awaiting [facility]UC to contact ultrasound.    Ultrasound negative for intrauterine or ectopic pregnancy.  Elevated hCG level.  It is possible that patient had a miscarriage and we have a downtrending hCG however we have no comparison hCG.  Patient will need to follow-up with OB/GYN to ensure that she has a downtrending hCG otherwise further evaluation will be required.    MDM  Number of Diagnoses or Management Options  Lower abdominal pain: new, needed workup  Diagnosis management comments: Vaginitis panel negative.  Urinalysis not indicative of infection.  Negative HIV negative syphilis.  Elevated hCG with no evidence of intrauterine or ectopic pregnancy.  Will  need to follow-up with OB.       Amount and/or Complexity of Data Reviewed  Clinical lab tests: ordered and reviewed  Tests in the radiology section of CPT??: ordered and reviewed  Review and summarize past medical records: yes  Discuss the patient with other providers: yes  Independent visualization of images, tracings, or specimens: yes    Risk of Complications, Morbidity, and/or Mortality  Presenting problems: moderate  Diagnostic procedures: moderate  Management options: moderate    Patient Progress  Patient progress: stable      PROCEDURES:  See physical exam for pelvic exam    CONSULTS:  IP CONSULT TO OB GYN    CRITICAL CARE:  Please see attending  note    FINAL IMPRESSION     1. Lower abdominal pain          DISPOSITION / PLAN   DISPOSITION Decision To Discharge 12/02/2017 07:48:29 PM      Evaluation and treatment course in the ED, and plan of care upon discharge was discussed in length with the patient.  Patient had no further questions prior to being discharged and was instructed to return to the ED for new or worsening symptoms.  Any changes to existing medications or new prescriptions were reviewed with patient and they expressed understanding of how to correctly take their medications and the possible side effects.    PATIENT REFERRED TO:  [facility]  [address]  [place] [address]  [phone]  In 2 days        DISCHARGE MEDICATIONS:  Discharge Medication List as of 12/02/2017  7:49 PM          Heinz Knuckles, DO  Emergency Medicine Resident Physician, PGY-1    (Please note that portions of this note were completed with a voice recognition program.  Efforts were made to edit the dictations but occasionally words are mis-transcribed.)          Heinz Knuckles, MD  12/02/17 2029

## 2017-12-02 NOTE — ED Notes (Signed)
[facility], Questions answered.      Johnnette Gourdolleen G Keontre Defino, RN  12/02/17 [phone]

## 2017-12-02 NOTE — ED Notes (Signed)
Pt to ed from home reports positive pregnancy test Monday. Pt [facility] due to being short of breath while ambulating. Pt is aox3. Pt reports she found out there she was pregnant, took a pregnancy test at home which was also positive. Pt reports vaginal spotting and lower abd cramping since Monday. Pt reports intermittent nausea and vomiting.      Johnnette Gourdolleen G Wesley Ducre, RN  12/02/17 1328

## 2017-12-02 NOTE — ED Notes (Signed)
Pt taken to US.     Tracie HarrierJennifer Suzana Sohail, RN  12/02/17 1734

## 2017-12-02 NOTE — ED Provider Notes (Signed)
[facility]     Emergency Department     Faculty Attestation    I performed a history and physical examination of the patient and discussed management with the resident. I reviewed the resident???s note and agree with the documented findings and plan of care. Any areas of disagreement are noted on the chart. I was personally present for the key portions of any procedures. I have documented in the chart those procedures where I was not present during the key portions. I have reviewed the emergency nurses triage note. I agree with the chief complaint, past medical history, past surgical history, allergies, medications, social and family history as documented unless otherwise noted below.        For Physician Assistant/ Nurse Practitioner cases/documentation I have personally evaluated this patient and have completed at least one if not all key elements of the E/M (history, physical exam, and MDM). Additional findings are as noted.  I have personally seen and evaluated the patient.  I find the patient's history and physical exam are consistent with the NP/PA documentation.  I agree with the care provided, treatment rendered, disposition and follow-up plan.    22 year old female presenting with lower abdominal pain, spotting, and positive pregnancy test at home.  history of prior ectopic pregnancy.  bedside ultrasound unable to confirm IUP, formal ultrasound obtained to rule out ectopic pregnancy.    Patient signed out to oncoming physician pending results of ultrasound.      Critical Care    Conni SlipperAileen Rita Vialpando, MD   Attending Emergency  Physician              Conni SlipperAileen Markale Birdsell, MD  12/04/17 [phone]

## 2017-12-03 ENCOUNTER — Encounter

## 2017-12-03 LAB — CULTURE, URINE: Culture: NO GROWTH

## 2017-12-03 LAB — C.TRACHOMATIS N.GONORRHOEAE DNA
C. trachomatis DNA: NEGATIVE
N. gonorrhoeae DNA: NEGATIVE

## 2017-12-03 NOTE — Telephone Encounter (Signed)
Pt states she [facility] yesterday for abdominal pain and found out she was pregnant and was told to call OB to have repeat HCG testing to check on levels. Pt had HCG on 12/02/17 and value was 236. TVUS did not show evidence of intrauterine pregnancy or ectopic pregnancy. Pt reports that her LMP was 11/02/17 which was a week late.

## 2017-12-04 ENCOUNTER — Inpatient Hospital Stay
Admit: 2017-12-04 | Discharge: 2017-12-04 | Disposition: A | Discharge status: Home / Self Care | Payer: Medicaid Other | Attending: Emergency Medicine

## 2017-12-04 DIAGNOSIS — Z3201 Encounter for pregnancy test, result positive: Secondary | ICD-10-CM

## 2017-12-04 LAB — HCG, QUANTITATIVE, PREGNANCY: hCG Quant: 672 IU/L — ABNORMAL HIGH (ref <5)

## 2017-12-04 NOTE — ED Provider Notes (Signed)
eMERGENCY dEPARTMENT eNCOUnter   Independent Attestation     Pt Name: Brenda Zamora Graley  MRN: [id number]  Birthdate 07/17/1995  Date of evaluation: 12/04/17     Brenda Zamora [place] is a 22 y.o. female with CC: Labs Only (hcg)      Based on the medical record the care appears appropriate.  I was personally available for consultation in the Emergency Department.    Azzie AlmasMATTHEW Zully Frane, MD  Attending Emergency Physician                    Azzie AlmasMatthew Georgios Kina, MD  12/04/17 [phone]

## 2017-12-04 NOTE — ED Provider Notes (Signed)
[facility] ED  eMERGENCYdEPARTMENT eNCOUnter      Pt Name: Brenda Zamora  MRN: [id number]  Birthdate Jul 02, 1995  Date of evaluation: 12/04/2017  Provider:Xoie Kreuser, APRN - CNP    CHIEF COMPLAINT       Chief Complaint   Patient presents with   ??? Labs Only     hcg         HISTORY OF PRESENT ILLNESS  (Location/Symptom, Timing/Onset, Context/Setting, Quality, Duration, Modifying Factors, Severity.)   Brenda Zamora is a 21 y.o. female who presents to the emergency department repeat hCG level.  Patient relates that she was seen at [facility] emergency department days ago and had positive pregnancy test.  Patient was told to follow-up with OB/GYN and if she could not follow-up with them to come to ED for repeat hCG levels.  Patient relates she attempted to schedule an appointment but never heard back from OB/GYN's office.  She denies any vaginal bleeding or discharge.  Reports intermittent abdominal cramping.  States her last menstrual period was August 20 and it was a week late and it was lighter than normal. No fever, chills, nausea or vomiting.    Nursing Notes were reviewed and I agree.    REVIEW OF SYSTEMS    (2-9 systems for level 4,10 or more for level 5)      Review of Systems   Constitutional: Negative for chills and fever.   HENT: Negative for trouble swallowing.    Respiratory: Negative for cough and shortness of breath.    Cardiovascular: Negative for chest pain and palpitations.   Gastrointestinal: Negative for abdominal pain, nausea and vomiting.     Except as noted above the remainder of the review of systems was reviewed andnegative.       PAST MEDICAL HISTORY         Diagnosis Date   ??? Anemia    ??? Asthma    ??? Bipolar 1 disorder (HCC)    ??? Depression    ??? Insomnia    ??? PTSD (post-traumatic stress disorder)      Reviewed.  SURGICAL HISTORY           Procedure Laterality Date   ??? BUNIONECTOMY     ??? WRIST SURGERY       Reviewed.  CURRENT MEDICATIONS       Discharge Medication List as of 12/04/2017  1:56 PM       CONTINUE these medications which have NOT CHANGED    Details   IRON PO Take by mouthHistorical Med             ALLERGIES     Rocephin [ceftriaxone]; Penicillins; Lidocaine viscous hcl; and Zithromax [azithromycin]    FAMILY HISTORY     History reviewed. No pertinent family history.  No family status information on file.      Reviewed and not relevant.    SOCIAL HISTORY      reports that she has quit smoking. She has never used smokeless tobacco. She reports that she drank alcohol. She reports that she has current or past drug history. Drug: Marijuana.  Reviewed.   PHYSICAL EXAM    (up to 7 for level 4, 8 or more for level 5)     ED Triage Vitals [12/04/17 1225]   BP Temp Temp Source Pulse Resp SpO2 Height Weight   109/60 98 ??F (36.7 ??C) Oral 112 14 -- 5\' 2"  (1.575 m) 146 lb (66.2 kg)       Physical  Exam   Constitutional: She is oriented to person, place, and time. She appears well-developed and well-nourished. No distress.   HENT:   Head: Normocephalic and atraumatic.   Right Ear: External ear normal.   Left Ear: External ear normal.   Nose: Nose normal.   Eyes: Right eye exhibits no discharge. Left eye exhibits no discharge. No scleral icterus.   Neck: Normal range of motion. No tracheal deviation present.   Cardiovascular: Normal rate and regular rhythm.   Pulmonary/Chest: Effort normal and breath sounds normal. No stridor. No respiratory distress.   Abdominal: Soft. Bowel sounds are normal. There is no tenderness.   Musculoskeletal: Normal range of motion. She exhibits no edema.   Neurological: She is alert and oriented to person, place, and time. Coordination normal.   Skin: Skin is warm and dry. She is not diaphoretic.   Psychiatric: She has a normal mood and affect. Her behavior is normal.       DIAGNOSTIC RESULTS     RADIOLOGY:   none      LABS:  Labs Reviewed   HCG, QUANTITATIVE, PREGNANCY - Abnormal; Notable for the following components:       Result Value    hCG Quant 672 (*)     All other components  within normal limits       All other labs were within normal range or not returned asof this dictation.    EMERGENCYDEPARTMENT COURSE and DIFFERENTIAL DIAGNOSIS/MDM:   Patient presents to ED for repeat Hcg testing. Was not able to see Ob GYN. [facility], US showed no IUP or ectopic. Denies any complaints today.    Spoke with dr Tessie EkeKwon Ob GYN resident. Updated on quant results of 672, number going up. Patient is to call clinic on Monday for follow up appointment. She also comments that [facility] Monday (48 hours) for another quant hCG repeat.  Patient updated on plan of care and agreeable.  Okay to discharge home.  Follow-upwith family doctor or clinic of choice in 1 or 2 days for a recheck.  Return to ED if any worsening or new symptoms.      Vitals:    Vitals:    12/04/17 1225 12/04/17 1241 12/04/17 1321   BP: 109/60  (!) 117/56   Pulse: 112  99   Resp: 14     Temp: 98 ??F (36.7 ??C)     TempSrc: Oral     SpO2:  96%    Weight: 146 lb (66.2 kg)     Height: 5\' 2"  (1.575 m)           Vitals reviewed.     PROCEDURES:  None    FINAL IMPRESSION      1. Encounter for laboratory test          DISPOSITION/PLAN   DISPOSITION Decision To Discharge 12/04/2017 01:53:05 PM      PATIENT REFERRED TO:  [facility]  [address]  [place] [id number]  [phone]  Schedule an appointment as soon as possible for a visit in 2 days  Follow up visit    [facility]  [address]  [place] [id number]  [phone]    If symptoms worsen      DISCHARGE MEDICATIONS:  Discharge Medication List as of 12/04/2017  1:56 PM          (Please note thatportions of this note were completed  with a voice recognition program.  Efforts were made to edit the dictations but occasionally words are mis-transcribed.)    Jonell Brumbaugh, APRN - CNP        Stanford Breed, APRN - CNP  12/04/17 1502

## 2017-12-06 NOTE — Telephone Encounter (Signed)
Spoke with pt and advised her to complete BHCG on 12/08/17 per Dr. Norlene DuelKiyak and pt is now scheduled for follow up appointment on 12/09/17

## 2017-12-06 NOTE — Telephone Encounter (Signed)
-----   Message from Beverly GustJames J Kiyak, MD sent at 12/03/2017  4:12 PM EDT -----  Regarding: RE: BHCG followup care  Recommend follow up BHCG 12/08/17. Clinic follow up 12/09/17. Please inform patient and schedule follow up.   ----- Message -----  From: Siri ColeSandra Kiyak, RN  Sent: 12/03/2017   1:10 PM EDT  To: Beverly GustJames J Kiyak, MD, Duffy BruceBethany Kendarius Vigen, CMA  Subject: BHCG followup care                               Pt was eval in ER for abdominal cramping. 12/02/17     + home UPT    LMP 11/02/17      Beta HCG 12/02/17-  236    U/S- No IUP or ecoptic visualized. Recommend serial bHCG's and or f/u u/s.       Please advise next step for this patient's plan of care.      Thank you- Andrey CampanileSandy

## 2017-12-07 ENCOUNTER — Encounter: Attending: Medical Oncology

## 2017-12-09 ENCOUNTER — Encounter

## 2017-12-09 ENCOUNTER — Encounter: Payer: Medicaid Other | Attending: Obstetrics & Gynecology

## 2017-12-09 NOTE — Telephone Encounter (Signed)
Agree with advise given to the patient due to her not being able to come to our office today.

## 2017-12-09 NOTE — Telephone Encounter (Signed)
Patient is scheduled for ER fu today.She states she passed a blood clot today and is  cramping, also lightheaded and vomiting.    Patient has no transportation and would be walking from [place] side to her appt and ER.    Writer spoke to Dr. Norlene Duel and Andrey Campanile RN and she was advised to call an ambulance and go to nearest ER.  She is agreeable with plan.

## 2017-12-15 ENCOUNTER — Inpatient Hospital Stay
Admit: 2017-12-15 | Discharge: 2017-12-16 | Disposition: A | Discharge status: Home / Self Care | Payer: MEDICAID | Attending: Emergency Medicine

## 2017-12-15 ENCOUNTER — Emergency Department: Admit: 2017-12-15 | Payer: MEDICAID

## 2017-12-15 DIAGNOSIS — O209 Hemorrhage in early pregnancy, unspecified: Secondary | ICD-10-CM

## 2017-12-15 LAB — VAGINITIS DNA PROBE
Direct Exam: NEGATIVE
Direct Exam: NEGATIVE
Direct Exam: NEGATIVE

## 2017-12-15 LAB — HCG, QUANTITATIVE, PREGNANCY: hCG Quant: 37605 IU/L — ABNORMAL HIGH (ref <5)

## 2017-12-15 LAB — URINALYSIS WITH REFLEX TO CULTURE
Bilirubin Urine: NEGATIVE
Glucose, Ur: NEGATIVE
Leukocyte Esterase, Urine: NEGATIVE
Nitrite, Urine: NEGATIVE
Protein, UA: NEGATIVE
Specific Gravity, UA: 1.023 (ref 1.005–1.030)
Urine Hgb: NEGATIVE
Urobilinogen, Urine: NORMAL
pH, UA: 6.5 (ref 5.0–8.0)

## 2017-12-15 LAB — ABO/RH: ABO/Rh: O POS

## 2017-12-15 MED ORDER — DOXYLAMINE SUCCINATE (SLEEP) 25 MG PO TABS
25 MG | Freq: Once | ORAL | Status: AC
Start: 2017-12-15 — End: 2017-12-15
  Administered 2017-12-15: 22:00:00 25 mg via ORAL

## 2017-12-15 MED ORDER — VITAMIN B-6 50 MG PO TABS
50 MG | Freq: Once | ORAL | Status: AC
Start: 2017-12-15 — End: 2017-12-15
  Administered 2017-12-15: 22:00:00 50 mg via ORAL

## 2017-12-15 MED ORDER — ACETAMINOPHEN 325 MG PO TABS
325 MG | Freq: Once | ORAL | Status: AC
Start: 2017-12-15 — End: 2017-12-15
  Administered 2017-12-15: 22:00:00 650 mg via ORAL

## 2017-12-15 MED FILL — SLEEP AID 25 MG PO TABS: 25 mg | ORAL | Qty: 1

## 2017-12-15 MED FILL — ACETAMINOPHEN 325 MG PO TABS: 325 mg | ORAL | Qty: 2

## 2017-12-15 MED FILL — VITAMIN B-6 50 MG PO TABS: 50 mg | ORAL | Qty: 1

## 2017-12-15 NOTE — ED Notes (Signed)
SW assist with [employer] for transportation home upon D/C from ED      Lynetta Mare, LSW  12/15/17 2102

## 2017-12-15 NOTE — ED Notes (Signed)
Pt presented to ed via self c/o lower abd cramping and nausea with being [redacted] wks pregnant. Pt is A&Ox4 with even non labored breaths. Pt ambulated to room with steady gait. Pt states she has been seen a few times for similar symptoms. Pt states she passed a clot prior and her hcg rose.      Ihor Austinhomas J Jaxten Brosh, RN  12/15/17 [phone]

## 2017-12-15 NOTE — ED Provider Notes (Signed)
[facility] ED  Emergency Department Encounter  Emergency Medicine Resident     Pt Name: Brenda Zamora  MRN: [id number]  Birthdate [birth year]  Date of evaluation: 12/15/17  PCP:  No primary care provider on file.    CHIEF COMPLAINT       Chief Complaint   Patient presents with   ??? Abdominal Cramping     7 wks preg        HISTORY OFPRESENT ILLNESS  (Location/Symptom, Timing/Onset, Context/Setting, Quality, Duration, Modifying Factors,Severity.)      Brenda Zamora is a 22 year old female [id number] who presents at [redacted] weeks gestation with complaint of cramping lower abdominal pain several days of spotting and inability to keep anything down throughout her pregnancy Lmp 11/02/17.  Was seen in the ED on September 19 at [facility] and followed up at [facility] 2 days later for repeat hCG which showed return 236-->672.     PAST MEDICAL / SURGICAL / SOCIAL / FAMILY HISTORY      has a past medical history of Anemia, Asthma, Bipolar 1 disorder (HCC), Depression, Insomnia, and PTSD (post-traumatic stress disorder).     has a past surgical history that includes Bunionectomy and Wrist surgery.     Social History     Socioeconomic History   ??? Marital status: Single     Spouse name: Not on file   ??? Number of children: Not on file   ??? Years of education: Not on file   ??? Highest education level: Not on file   Occupational History   ??? Not on file   Social Needs   ??? Financial resource strain: Not on file   ??? Food insecurity:     Worry: Not on file     Inability: Not on file   ??? Transportation needs:     Medical: Not on file     Non-medical: Not on file   Tobacco Use   ??? Smoking status: Former Smoker   ??? Smokeless tobacco: Never Used   Substance and Sexual Activity   ??? Alcohol use: Not Currently   ??? Drug use: Yes     Types: Marijuana   ??? Sexual activity: Not on file   Lifestyle   ??? Physical activity:     Days per week: Not on file     Minutes per session: Not on file   ??? Stress: Not on file   Relationships   ??? Social  connections:     Talks on phone: Not on file     Gets together: Not on file     Attends religious service: Not on file     Active member of club or organization: Not on file     Attends meetings of clubs or organizations: Not on file     Relationship status: Not on file   ??? Intimate partner violence:     Fear of current or ex partner: Not on file     Emotionally abused: Not on file     Physically abused: Not on file     Forced sexual activity: Not on file   Other Topics Concern   ??? Not on file   Social History Narrative   ??? Not on file       No family history on file.     Allergies:  Rocephin [ceftriaxone]; Penicillins; Lidocaine viscous hcl; and Zithromax [azithromycin]    Home Medications:  Prior to Admission medications    Medication Sig Start  Date End Date Taking? Authorizing Provider   IRON PO Take by mouth    Historical Provider, MD       REVIEW OFSYSTEMS    (2-9 systems for level 4, 10 or more for level 5)      Review of Systems   Constitutional: Negative for chills, fatigue and fever.   Respiratory: Negative for cough, chest tightness and shortness of breath.    Cardiovascular: Negative for chest pain.   Gastrointestinal: Positive for abdominal pain, nausea and vomiting. Negative for constipation and diarrhea.   Genitourinary: Positive for vaginal bleeding and vaginal pain. Negative for difficulty urinating, dysuria, flank pain, frequency and hematuria.   Neurological: Negative for syncope, light-headedness and numbness.   Psychiatric/Behavioral: Negative for agitation and confusion.       PHYSICAL EXAM   (up to 7 for level 4, 8 or more forlevel 5)      INITIAL VITALS:   ED Triage Vitals   BP Temp Temp src Pulse Resp SpO2 Height Weight   -- -- -- -- -- -- -- --       Physical Exam   Constitutional: She is oriented to person, place, and time. She appears well-developed and well-nourished. No distress.   HENT:   Head: Normocephalic and atraumatic.   Right Ear: External ear normal.   Left Ear: External ear  normal.   Nose: Nose normal.   Mouth/Throat: Oropharynx is clear and moist.   Eyes: Pupils are equal, round, and reactive to light. Conjunctivae and EOM are normal. Right eye exhibits no discharge. Left eye exhibits no discharge.   Neck: Normal range of motion. Neck supple.   Cardiovascular: Normal rate, regular rhythm, normal heart sounds and intact distal pulses.   No murmur heard.  Pulmonary/Chest: Effort normal and breath sounds normal. No respiratory distress. She has no wheezes.   Abdominal: Bowel sounds are normal. She exhibits no distension. There is tenderness. There is no rebound and no guarding.   Musculoskeletal: Normal range of motion. She exhibits no edema, tenderness or deformity.   Neurological: She is alert and oriented to person, place, and time.   Psychiatric: She has a normal mood and affect. Her behavior is normal.   Nursing note and vitals reviewed.      DIFFERENTIAL  DIAGNOSIS     PLAN (LABS / IMAGING / EKG):  Orders Placed This Encounter   Procedures   ??? C.trachomatis N.gonorrhoeae DNA   ??? VAGINITIS DNA PROBE   ??? US OB TRANSVAGINAL   ??? Urinalysis Reflex to Culture   ??? HCG, Quantitative, Pregnancy   ??? Vaginal exam   ??? ABO/RH       MEDICATIONS ORDERED:  Orders Placed This Encounter   Medications   ??? vitamin B-6 (PYRIDOXINE) tablet 50 mg   ??? doxyLAMINE succinate (GNP SLEEP AID) tablet 25 mg   ??? acetaminophen (TYLENOL) tablet 650 mg       DDX: ectopic, miscarriage, gastroenteritis, hyperemesis gravidarum     Initial MDM/Plan: 22 y.o. female who presents with running inability to tolerate p.o. cramping abdominal pain in the setting of a 7-week gestation.  Plan will be to obtain a serum hCG quant perform pelvic examination and swabs assess for Rh status provide RhoGam as needed and obtain transvaginal ultrasound to rule out ectopic pregnancy.    DIAGNOSTIC RESULTS / EMERGENCYDEPARTMENT COURSE / MDM     LABS:  Labs Reviewed   URINE RT REFLEX TO CULTURE - Abnormal; Notable for the following  components:  Result Value    Ketones, Urine TRACE (*)     All other components within normal limits   HCG, QUANTITATIVE, PREGNANCY - Abnormal; Notable for the following components:    hCG Quant 16,109 (*)     All other components within normal limits   VAGINITIS DNA PROBE   C.TRACHOMATIS N.GONORRHOEAE DNA   ABO/RH         RADIOLOGY:  No results found.      EKG    All EKG's are interpreted by the Emergency Department Physicianwho either signs or Co-signs this chart in the absence of a cardiologist.    EMERGENCY DEPARTMENT COURSE:  ED Course as of Dec 16 1910   Wed Dec 15, 2017   1648 Female rn chaperoned pelvic exam shows closed os, mucoid discharge, no cmt but left sided adnexal tenderness. No vaginal lesions    [BG]   1756 Tv ultrasound pending quant    [BG]   1837 Spoke with ultrasound brittany --->coming for patient    [BG]      ED Course User Index  [BG] Lurline Idol, DO          PROCEDURES:  None    CONSULTS:  None    CRITICAL CARE:  Please see attending note    FINAL IMPRESSION      1. Nausea    2. Vaginal bleeding affecting early pregnancy    3. Abdominal pain, unspecified abdominal location         DISPOSITION / PLAN     DISPOSITION  care transferred to dr. Anson Fret at [phone].      PATIENT REFERRED TO:  No follow-up provider specified.    DISCHARGE MEDICATIONS:  New Prescriptions    No medications on file       Lurline Idol, DO  Emergency Medicine Resident    (Please note that portions of this note were completed with a voice recognition program.Efforts were made to edit the dictations but occasionally words are mis-transcribed.)       Lurline Idol, DO  Resident  12/15/17 [phone]

## 2017-12-15 NOTE — ED Provider Notes (Signed)
[place] [facility]     Emergency Department     Faculty Attestation    I performed a history and physical examination of the patient and discussed management with the resident. I reviewed the resident???s note and agree with the documented findings and plan of care. Any areas of disagreement are noted on the chart. I was personally present for the key portions of any procedures. I have documented in the chart those procedures where I was not present during the key portions. I have reviewed the emergency nurses triage note. I agree with the chief complaint, past medical history, past surgical history, allergies, medications, social and family history as documented unless otherwise noted below. For Physician Assistant/ Nurse Practitioner cases/documentation I have personally evaluated this patient and have completed at least one if not all key elements of the E/M (history, physical exam, and MDM). Additional findings are as noted.    Here with abdominal cramping spotting, 6 weeks 1 day by last menstrual period.  Had positive pregnancy test with no confirmed IUP. 9/19 236, 2/21 672.  Been unable to follow-up with OB.  On exam resting comfortably nontoxic.  Abdomen soft nontender no focal rebound no guarding.  Will check quant, ultrasound, reevaluate    Critical Care     none    Elgie CongoMichael J Andree Golphin, MD, Lacie ScottsFACEP, FAAEM  Attending Emergency  Physician             Elgie CongoMichael J Tameshia Bonneville, MD  12/15/17 [phone]

## 2017-12-15 NOTE — ED Notes (Signed)
Pt resting in bed in NAD with even and unlabored rr, pt family and child at bedside, will continue to assess      Pauline AusBrooke Timiko Offutt, RN  12/15/17 2008

## 2017-12-15 NOTE — ED Provider Notes (Signed)
[facility] ED  Emergency Department  Emergency Medicine Resident Sign-out     Care of Jeslynn Hollander was assumed from Dr. Laurence Compton and is being seen for Abdominal Cramping (7 wks preg )  .  The patient's initial evaluation and plan have been discussed with the prior provider who initially evaluated the patient.     EMERGENCY DEPARTMENT COURSE / MEDICAL DECISION MAKING:       MEDICATIONS GIVEN:  Orders Placed This Encounter   Medications   ??? vitamin B-6 (PYRIDOXINE) tablet 50 mg   ??? doxyLAMINE succinate (GNP SLEEP AID) tablet 25 mg   ??? acetaminophen (TYLENOL) tablet 650 mg     LABS / RADIOLOGY:     Labs Reviewed   URINE RT REFLEX TO CULTURE - Abnormal; Notable for the following components:       Result Value    Ketones, Urine TRACE (*)     All other components within normal limits   HCG, QUANTITATIVE, PREGNANCY - Abnormal; Notable for the following components:    hCG Quant 43,329 (*)     All other components within normal limits   VAGINITIS DNA PROBE   C.TRACHOMATIS N.GONORRHOEAE DNA   ABO/RH       US OB TRANSVAGINAL   Final Result   Single live intrauterine pregnancy with measurements corresponding to an   estimated gestational age of [redacted] weeks 0 days           RECENT VITALS:     Temp: 98.2 ??F (36.8 ??C),  Pulse: 98, Resp: 17, BP: 124/74, SpO2: 100 %    This patient is a 22 y.o. Female with a threatened abortion.  The patient is roughly 6 weeks out from her last menstrual cycle and reported spotting and lower abdominal cramping.  The patient believes she is pregnant.  Patient is G4, P2 and states that earlier today she had spotting and lower abdominal cramping.  There is been no frank bleeding.  Pelvic exam revealed a closed office with no visible blood in the vaginal vault.  Quantitative pregnancy test revealed a 32,000 quant and a transvaginal ultrasound has been ordered.    OUTSTANDING TASKS / RECOMMENDATIONS:      1. Await results of transvaginal ultrasound - if negative discharge     FINAL  IMPRESSION:     1. Nausea    2. Vaginal bleeding affecting early pregnancy    3. Abdominal pain, unspecified abdominal location    4. Bloody show and cramping in early pregnancy      DISPOSITION:       DISPOSITION:  [x]   Discharge   []   Transfer -    []   Admission -     []   Against Medical Advice   []   Eloped   FOLLOW-UP: Lanier Ensign, DO  [address]  [place] [address]  [phone]    Schedule an appointment as soon as possible for a visit        DISCHARGE MEDICATIONS: New Prescriptions    No medications on file          Saverio Danker, MD  Emergency Medicine Resident  [facility]       Saverio Danker, MD  Resident  12/15/17 [phone]

## 2017-12-16 ENCOUNTER — Encounter: Attending: Neurology

## 2017-12-16 LAB — C.TRACHOMATIS N.GONORRHOEAE DNA
C. trachomatis DNA: NEGATIVE
N. gonorrhoeae DNA: NEGATIVE

## 2017-12-22 ENCOUNTER — Ambulatory Visit: Admit: 2017-12-22 | Discharge: 2017-12-22 | Payer: MEDICAID | Attending: Obstetrics & Gynecology

## 2017-12-22 ENCOUNTER — Inpatient Hospital Stay: Payer: MEDICAID

## 2017-12-22 DIAGNOSIS — O0991 Supervision of high risk pregnancy, unspecified, first trimester: Secondary | ICD-10-CM

## 2017-12-22 LAB — PRENATAL TYPE AND SCREEN
ABO/Rh: O POS
Antibody Screen: NEGATIVE

## 2017-12-22 MED ORDER — PRENATAL VITAMINS 0.8 MG PO TABS
0.8 | ORAL_TABLET | Freq: Every day | ORAL | 5 refills | Status: DC
Start: 2017-12-22 — End: 2018-07-19

## 2017-12-22 MED ORDER — VITAMIN B-6 50 MG PO TABS
50 MG | ORAL_TABLET | Freq: Every day | ORAL | 3 refills | Status: DC
Start: 2017-12-22 — End: 2018-07-19

## 2017-12-22 MED ORDER — DOXYLAMINE SUCCINATE (SLEEP) 25 MG PO TABS
25 | ORAL_TABLET | Freq: Every evening | ORAL | 1 refills | Status: DC
Start: 2017-12-22 — End: 2018-07-19

## 2017-12-22 NOTE — Patient Instructions (Signed)
Patient Education        Influenza (Flu) Vaccine (Inactivated or Recombinant): What You Need to Know  Why get vaccinated?  Influenza ("flu") is a contagious disease that spreads around the [place] every winter, usually between October and May.  Flu is caused by influenza viruses and is spread mainly by coughing, sneezing, and close contact.  Anyone can get flu. Flu strikes suddenly and can last several days. Symptoms vary by age, but can include:  ?? Fever/chills.  ?? Sore throat.  ?? Muscle aches.  ?? Fatigue.  ?? Cough.  ?? Headache.  ?? Runny or stuffy nose.  Flu can also lead to pneumonia and blood infections, and cause diarrhea and seizures in children. If you have a medical condition, such as heart or lung disease, flu can make it worse.  Flu is more dangerous for some people. Infants and young children, people 65 years of age and older, pregnant women, and people with certain health conditions or a weakened immune system are at greatest risk.  Each year thousands of people in the [place] die from flu, and many more are hospitalized.  Flu vaccine can:  ?? Keep you from getting flu.  ?? Make flu less severe if you do get it.  ?? Keep you from spreading flu to your family and other people.  Inactivated and recombinant flu vaccines  A dose of flu vaccine is recommended every flu season. Children 6 months through 8 years of age may need two doses during the same flu season. Everyone else needs only one dose each flu season.  Some inactivated flu vaccines contain a very small amount of a mercury-based preservative called thimerosal. Studies have not shown thimerosal in vaccines to be harmful, but flu vaccines that do not contain thimerosal are available.  There is no live flu virus in flu shots. They cannot cause the flu.  There are many flu viruses, and they are always changing. Each year a new flu vaccine is made to protect against three or four viruses that are likely to cause disease in the upcoming flu  season. But even when the vaccine doesn't exactly match these viruses, it may still provide some protection.  Flu vaccine cannot prevent:  ?? Flu that is caused by a virus not covered by the vaccine.  ?? Illnesses that look like flu but are not.  Some people should not get this vaccine  Tell the person who is giving you the vaccine:  ?? If you have any severe (life-threatening) allergies. If you ever had a life-threatening allergic reaction after a dose of flu vaccine, or have a severe allergy to any part of this vaccine, you may be advised not to get vaccinated. Most, but not all, types of flu vaccine contain a small amount of egg protein.  ?? If you ever had Guillain-Barr?? syndrome (also called GBS) Some people with a history of GBS should not get this vaccine. This should be discussed with your doctor.  ?? If you are not feeling well. It is usually okay to get flu vaccine when you have a mild illness, but you might be asked to come back when you feel better.  Risks of a vaccine reaction  With any medicine, including vaccines, there is a chance of reactions. These are usually mild and go away on their own, but serious reactions are also possible.  Most people who get a flu shot do not have any problems with it.  Minor problems following a   flu shot include:  ?? Soreness, redness, or swelling where the shot was given  ?? Hoarseness  ?? Sore, red or itchy eyes  ?? Cough  ?? Fever  ?? Aches  ?? Headache  ?? Itching  ?? Fatigue  If these problems occur, they usually begin soon after the shot and last 1 or 2 days.  More serious problems following a flu shot can include the following:  ?? There may be a small increased risk of Guillain-Barr?? Syndrome (GBS) after inactivated flu vaccine. This risk has been estimated at 1 or 2 additional cases per million people vaccinated. This is much lower than the risk of severe complications from flu, which can be prevented by flu vaccine.  ?? Young children who get the flu shot along with  pneumococcal vaccine (PCV13) and/or DTaP vaccine at the same time might be slightly more likely to have a seizure caused by fever. Ask your doctor for more information. Tell your doctor if a child who is getting flu vaccine has ever had a seizure  Problems that could happen after any injected vaccine:  ?? People sometimes faint after a medical procedure, including vaccination. Sitting or lying down for about 15 minutes can help prevent fainting, and injuries caused by a fall. Tell your doctor if you feel dizzy, or have vision changes or ringing in the ears.  ?? Some people get severe pain in the shoulder and have difficulty moving the arm where a shot was given. This happens very rarely.  ?? Any medication can cause a severe allergic reaction. Such reactions from a vaccine are very rare, estimated at about 1 in a million doses, and would happen within a few minutes to a few hours after the vaccination.  As with any medicine, there is a very remote chance of a vaccine causing a serious injury or death.  The safety of vaccines is always being monitored. For more information, visit: www.cdc.gov/vaccinesafety/.  What if there is a serious reaction?  What should I look for?  ?? Look for anything that concerns you, such as signs of a severe allergic reaction, very high fever, or unusual behavior.  Signs of a severe allergic reaction can include hives, swelling of the face and throat, difficulty breathing, a fast heartbeat, dizziness, and weakness - usually within a few minutes to a few hours after the vaccination.  What should I do?  ?? If you think it is a severe allergic reaction or other emergency that can't wait, call 9-1-1 and get the person to the nearest hospital. Otherwise, call your doctor.  ?? Reactions should be reported to the "Vaccine Adverse Event Reporting System" (VAERS). Your doctor should file this report, or you can do it yourself through the VAERS website at www.vaers.hhs.gov, or by calling  1-800-822-7967.  VAERS does not give medical advice.  The National Vaccine Injury Compensation Program  The National Vaccine Injury Compensation Program (VICP) is a federal program that was created to compensate people who may have been injured by certain vaccines.  Persons who believe they may have been injured by a vaccine can learn about the program and about filing a claim by calling 1-800-338-2382 or visiting the VICP website at www.hrsa.gov/vaccinecompensation. There is a time limit to file a claim for compensation.  How can I learn more?  ?? Ask your healthcare provider. He or she can give you the vaccine package insert or suggest other sources of information.  ?? Call your local or state health department.  ??   Contact the Centers for Disease Control and Prevention (CDC):  ? Call 1-800-232-4636 (1-800-CDC-INFO) or  ? Visit CDC's website at www.cdc.gov/flu  Vaccine Information Statement  Inactivated Influenza Vaccine  10/20/2013)  42 U[place] ?? 300aa-26  Department of Health and Human Services  Centers for Disease Control and Prevention  Many Vaccine Information Statements are available in Spanish and other languages. See www.immunize.org/vis.  Muchas hojas de informaci??n sobre vacunas est??n disponibles en espa??ol y en otros idiomas. Visite www.immunize.org/vis.  Care instructions adapted under license by [facility]. If you have questions about a medical condition or this instruction, always ask your healthcare professional. [employer] disclaims any warranty or liability for your use of this information.

## 2017-12-22 NOTE — Progress Notes (Signed)
OB/GYN Problem Visit    Brenda Zamora  12/22/2017                       Primary Care Physician: No primary care provider on file.    CC:   Chief Complaint   Patient presents with   ??? Follow-Up from Hospital         HPI: Brenda Zamora is a 22 y.o. female [phone]    The patient was seen and examined. She is here for follow up from ED visit. She reports she was seen at [facility] ED on 12/15/17 because she was having some vaginal spotting, cramping and nausea/vomiting. She reports her LMP was 11/02/17 and she found out she was pregnant on 12/02/17. She reports she initially went to the ED on 12/02/17 for cramping and spotting and they confirmed she was pregnant. She had a BHCG 236 and TVUS 12/02/17 showed no IUP. She has had an appropriate trend of BHCG 672 (12/04/17). She went back to the ED on 12/15/17 with complaints of vaginal spotting and cramping and BHCG was 37,605 and follow up TVUS showed SLIUP at [redacted]w[redacted]d w/ cardiac activity noted. She denies any further abdominal cramping or bleeding. She is complaining of some nausea and vomiting. Patient denies fevers, chills, headaches, vision changes, chest pain, SOB, RUQ pain, or increased pain/swelling in extremities. She reports this is a desired pregnancy.       REVIEW OF SYSTEMS:  Constitutional: negative fever, negative chills  HEENT: negative visual disturbances, negative headaches  Respiratory: negative dyspnea, negative cough  Cardiovascular: negative chest pain,  negative palpitations  Gastrointestinal: negative abdominal pain, negative RUQ pain, positive N/V, negative diarrhea, negative constipation  Genitourinary: negative dysuria, negative vaginal discharge  Dermatological: negative rash  Hematologic: negative bruising  Immunologic/Lymphatic: negative recent illness, negative recent sick contact  Musculoskeletal: negative back pain, negative myalgias, negative arthralgias  Neurological:  negative dizziness, negative weakness  Behavior/Psych: negative depression, negative  anxiety    ________________________________________________________________________      OBSTETRICAL HISTORY:  OB History   Gravida Para Term Preterm AB Living   4 2 2   1 2    SAB TAB Ectopic Molar Multiple Live Births             1      # Outcome Date GA Lbr Len/2nd Weight Sex Delivery Anes PTL Lv   4 Current            3 Term 05/07/16 [redacted]w[redacted]d  6 lb 10 oz (3.005 kg) M Vag-Spont None N    2 Term 12/26/13 [redacted]w[redacted]d  7 lb 13 oz (3.544 kg) M Vag-Spont None N LIV   1 AB 2014 [redacted]w[redacted]d    SAB         Birth Comments: No D&C      Obstetric Comments   FOB #1 (G1-2)   FOB #2 (G3)   FOB #3 (G4)       PAST MEDICAL HISTORY:      Diagnosis Date   ??? Anemia    ??? Asthma    ??? Bipolar 1 disorder (HCC)    ??? Borderline personality disorder (HCC)    ??? Depression    ??? Insomnia    ??? PTSD (post-traumatic stress disorder)    ??? Schizophrenia (HCC)        PAST SURGICAL HISTORY:  Procedure Laterality Date   ??? BUNIONECTOMY     ??? WRIST SURGERY         MEDICATIONS:  Current Outpatient Medications   Medication Sig Dispense Refill   ??? Prenatal Multivit-Min-Fe-FA (PRENATAL VITAMINS) 0.8 MG TABS Take 27.8 mg by mouth daily 30 tablet 5   ??? doxyLAMINE succinate (UNISOM) 25 MG tablet Take 1 tablet by mouth nightly 14 tablet 1   ??? vitamin B-6 (PYRIDOXINE) 50 MG tablet Take 1 tablet by mouth daily 30 tablet 3   ??? IRON PO Take by mouth       No current facility-administered medications for this visit.        ALLERGIES:  Allergies as of 12/22/2017 - Review Complete 12/22/2017   Allergen Reaction Noted   ??? Rocephin [ceftriaxone] Shortness Of Breath 10/25/2017   ??? Penicillins Hives 10/25/2017   ??? Lidocaine viscous hcl Nausea And Vomiting 10/25/2017   ??? Zithromax [azithromycin] Nausea And Vomiting 10/25/2017                                   VITALS:  Vitals:    12/22/17 1352   BP: 121/80   Pulse: 109   Weight: 148 lb (67.1 kg)   Height: 5\' 2"  (1.575 m)                                                                                                                                                                        PHYSICAL EXAM:     General Appearance: Appears healthy.  Alert; in no acute distress.  Pleasant.  Skin: Normal  HEENT: normocephalic and atraumati  Respiratory: clear to auscultation, no wheezes, rales or rhonchi, symmetric air entry  Cardiovascular: regular rate and rhythm  Breast:  (Chest): normal appearance, no masses or tenderness  Abdomen: soft, nontender, nondistended, no abnormal masses, no epigastric pain, soft, non-tender, non-distended, no right upper quadrant tenderness and no CVA tenderness  Pelvic Exam: deferred   Rectal Exam: deferred  Extremities: non-tender BLE and non-edematous  Musculoskeletal: No joint swelling, deformity, or tenderness., no gross abnormalities  Psych: Alert and oriented, appropriate affect.    OMM EXAM:  The patient did not complain of a Chief complaint requiring OMM.  Chief Complaint:none    Structural Exam: No Interest    DATA:  No results found for this visit on 12/22/17.    ASSESSMENT & PLAN:    Brenda Zamora is a 22 y.o. female [phone] for ED Follow up regarding Threatened AB   - Afebrile, VSS   - Patient denies any further cramping or bleeding   - Blood type RH positive    - Appropriately rising; BHCG  236 (12/02/17) > 672 (12/04/17) > 37,605 (12/15/17)   - TVUS 12/15/17 showed SLIUP at [redacted]w[redacted]d w/ cardiac activity    - Prenatal labs printed    - PNV, Unisom and Vitamin B6 ordered today    - Received influenza vaccination today    - Patient scheduled for new OB appointment with RN On 01/24/18    Patient Active Problem List    Diagnosis Date Noted   ??? Schizophrenia (HCC)    ??? PTSD (post-traumatic stress disorder)    ??? Depression    ??? Bipolar 1 disorder (HCC)    ??? Asthma    ??? Anemia    ??? Follow up: Pregnancy of unknown location  12/04/2017     St. V: Beta Quant 9/19: 236/ Transvaginal ultrasound with no IUP at [facility]. Vs  St. C: Beta Quant 9/21: 672, pt asymptomatic, VSS, likely  early IUP, will advise repeat beta-HCG quant in 48 hours and pt to follow up as outpatient with [facility] Ob[facility]yn clinic on Monday/Tuesday, advise ER to recommend pt to follow up at [facility]. V to repeat quant at same lab.            Return in about 2 weeks (around 01/05/2018) for New OB appointment .    Patient was seen with total face to face time of 15 minutes. More than 50% of this visit was on counseling and education regarding her diagnose(s) as listed below and options. She was also counseled on her preventative health maintenance recommendations and follow-up.      Diagnosis Orders   1. High-risk pregnancy in first trimester  PRENATAL PROFILE I   2. Schizophrenia, unspecified type (HCC)     3. PTSD (post-traumatic stress disorder)     4. Depression, unspecified depression type     5. Bipolar 1 disorder (HCC)     6. Uncomplicated asthma, unspecified asthma severity, unspecified whether persistent     7. Anemia, unspecified type         Charna Busman, DO  Ob/Gyn Resident  [facility], [facility]  12/23/2017, 6:33 AM

## 2017-12-22 NOTE — Progress Notes (Signed)
Attending Physician Statement  I have discussed the care of Brenda Zamora, including pertinent history and exam findings,  with the resident. I have reviewed the key elements of all parts of the encounter with the resident.  I agree with the assessment, plan and orders as documented by the resident.  (GE Modifier)

## 2017-12-22 NOTE — Progress Notes (Signed)
Vaccine Information Sheet, "Influenza - Inactivated"  given to Brenda Zamora, or parent/legal guardian of  Brenda Zamora and verbalized understanding.    Patient responses:    Have you ever had a reaction to a flu vaccine? No  Are you able to eat eggs without adverse effects?  Yes  Do you have any current illness?  No  Have you ever had Guillian Barre Syndrome?  No    Flu vaccine given per order. Please see immunization tab.

## 2017-12-23 LAB — PRENATAL PROFILE I
Absolute Eos #: 0.09 10*3/uL (ref 0.00–0.44)
Absolute Immature Granulocyte: <0.03 10*3/uL (ref 0.00–0.30)
Absolute Lymph #: 2.93 10*3/uL (ref 1.10–3.70)
Absolute Mono #: 0.67 10*3/uL (ref 0.10–1.20)
Basophils Absolute: <0.03 10*3/uL (ref 0.00–0.20)
Basophils: 0 % (ref 0–2)
Eosinophils %: 1 % (ref 1–4)
Hematocrit: 33.1 % — ABNORMAL LOW (ref 36.3–47.1)
Hemoglobin: 9.4 g/dL — ABNORMAL LOW (ref 11.9–15.1)
Hepatitis B Surface Ag: NONREACTIVE
Immature Granulocytes: 0 %
Lymphocytes: 42 % (ref 24–43)
MCH: 21.8 pg — ABNORMAL LOW (ref 25.2–33.5)
MCHC: 28.4 g/dL (ref 28.4–34.8)
MCV: 76.8 fL — ABNORMAL LOW (ref 82.6–102.9)
MPV: 12.3 fL (ref 8.1–13.5)
Monocytes: 10 % (ref 3–12)
NRBC Automated: 0 per 100 WBC
Platelets: 367 10*3/uL (ref 138–453)
RBC: 4.31 m/uL (ref 3.95–5.11)
RDW: 19.6 % — ABNORMAL HIGH (ref 11.8–14.4)
Rubella Antibody, IgG: 168.8 IU/mL
Seg Neutrophils: 47 % (ref 36–65)
Segs Absolute: 3.25 10*3/uL (ref 1.50–8.10)
T. pallidum, IgG: NONREACTIVE
WBC: 7 10*3/uL (ref 3.5–11.3)

## 2017-12-23 NOTE — Telephone Encounter (Signed)
Pharmacist from [facility] [phone] called wanting to get clarification on the patient PNV. The Rx direction says take 27.8mg  by mouth daily. Pharmacist would like to know if it should be 1 tablet by mouth daily?. Please advise or call pharmacy to make clarification.

## 2017-12-24 NOTE — Telephone Encounter (Signed)
Called [facility] and spoke with the pharmacist and updated patient prescription to 1 tab daily

## 2017-12-24 NOTE — Telephone Encounter (Signed)
Yes it should be one tablet daily.

## 2017-12-29 ENCOUNTER — Inpatient Hospital Stay
Admit: 2017-12-29 | Discharge: 2017-12-29 | Disposition: A | Discharge status: Home / Self Care | Payer: MEDICAID | Attending: Emergency Medicine

## 2017-12-29 DIAGNOSIS — S39012A Strain of muscle, fascia and tendon of lower back, initial encounter: Secondary | ICD-10-CM

## 2017-12-29 MED ORDER — ACETAMINOPHEN 325 MG PO TABS
325 MG | Freq: Once | ORAL | Status: AC
Start: 2017-12-29 — End: 2017-12-29
  Administered 2017-12-29: 23:00:00 650 mg via ORAL

## 2017-12-29 MED FILL — TYLENOL 325 MG PO TABS: 325 mg | ORAL | Qty: 2

## 2017-12-29 NOTE — ED Provider Notes (Signed)
[facility] ED  eMERGENCY dEPARTMENT eNCOUnter   Independent Attestation     Pt Name: Brenda Zamora  MRN: [id number]  Birthdate 09/07/1995  Date of evaluation: 12/29/17   Brenda Zamora is a 22 y.o. female who presents with Back Pain    Vitals:   Vitals:    12/29/17 1723   BP: 125/77   Pulse: 95   Resp: 16   Temp: 98.5 ??F (36.9 ??C)   TempSrc: Oral   SpO2: 100%   Weight: 149 lb (67.6 kg)   Height: 5\' 2"  (1.575 m)     Impression:   1. Strain of lumbar region, initial encounter      I was personally available for consultation in the Emergency Department. I have reviewed the chart and agree with the documentation as recorded by the [facility], including the assessment, treatment plan and disposition.  Manley Mason, MD  Attending Emergency  Physician                  Manley Mason, MD  12/29/17 [phone]

## 2017-12-29 NOTE — ED Provider Notes (Signed)
[facility] ED  eMERGENCY dEPARTMENT eNCOUnter      Pt Name: Brenda Zamora  MRN: [id number]  Birthdate 01/04/1996  Date of evaluation: 12/29/17      CHIEF COMPLAINT       Chief Complaint   Patient presents with   ??? Back Pain         HISTORY OF PRESENT ILLNESS    Brenda Zamora is a 22 y.o. female who presents complaining of low back pain, no loss of bowel or bladder control no perianal numbness no abdominal pain no vaginal bleeding or discharge  The history is provided by the patient.   Back Pain   Location:  Sacro-iliac joint  Quality:  Aching  Radiates to:  Does not radiate  Pain severity:  Moderate  Pain is:  Same all the time  Onset quality:  Gradual  Duration:  7 days  Timing:  Intermittent  Progression:  Waxing and waning  Chronicity:  Recurrent  Context: not falling, not jumping from heights, not lifting heavy objects, not physical stress, not recent illness and not recent injury    Relieved by:  Nothing  Worsened by:  Movement  Ineffective treatments:  None tried  Associated symptoms: leg pain (right hip area)    Associated symptoms: no abdominal pain, no abdominal swelling, no bladder incontinence, no bowel incontinence, no dysuria, no numbness, no paresthesias, no pelvic pain, no perianal numbness, no tingling and no weakness    Risk factors: pregnancy        REVIEW OF SYSTEMS       Review of Systems   Gastrointestinal: Negative for abdominal pain and bowel incontinence.   Genitourinary: Negative for bladder incontinence, dysuria, flank pain, hematuria, pelvic pain, vaginal bleeding and vaginal discharge.   Musculoskeletal: Positive for back pain.   Neurological: Negative for tingling, weakness, numbness and paresthesias.   All other systems reviewed and are negative.      PAST MEDICAL HISTORY     Past Medical History:   Diagnosis Date   ??? Anemia    ??? Asthma    ??? Bipolar 1 disorder (HCC)    ??? Borderline personality disorder (HCC)    ??? Depression    ??? Insomnia    ??? PTSD (post-traumatic stress disorder)    ???  Schizophrenia (HCC)        SURGICAL HISTORY       Past Surgical History:   Procedure Laterality Date   ??? BUNIONECTOMY     ??? WRIST SURGERY         CURRENT MEDICATIONS       Discharge Medication List as of 12/29/2017  6:55 PM      CONTINUE these medications which have NOT CHANGED    Details   Prenatal Multivit-Min-Fe-FA (PRENATAL VITAMINS) 0.8 MG TABS Take 27.8 mg by mouth daily, Disp-30 tablet, R-5Normal      doxyLAMINE succinate (UNISOM) 25 MG tablet Take 1 tablet by mouth nightly, Disp-14 tablet, R-1Normal      vitamin B-6 (PYRIDOXINE) 50 MG tablet Take 1 tablet by mouth daily, Disp-30 tablet, R-3Normal      IRON PO Take by mouthHistorical Med             ALLERGIES     is allergic to rocephin [ceftriaxone]; penicillins; lidocaine viscous hcl; and zithromax [azithromycin].    FAMILY HISTORY     She indicated that the status of her mother is unknown. She indicated that the status of her father is unknown. She indicated  that the status of her brother is unknown. She indicated that the status of her maternal grandmother is unknown. She indicated that the status of her paternal grandmother is unknown.       SOCIAL HISTORY      reports that she quit smoking about 4 years ago. She has never used smokeless tobacco. She reports that she drank alcohol. She reports that she has current or past drug history. Drug: Marijuana.    PHYSICAL EXAM     INITIAL VITALS: BP 125/77    Pulse 95    Temp 98.5 ??F (36.9 ??C) (Oral)    Resp 16    Ht 5\' 2"  (1.575 m)    Wt 149 lb (67.6 kg)    LMP 11/02/2017    SpO2 100%    BMI 27.25 kg/m??      Physical Exam   Constitutional: She is oriented to person, place, and time. She appears well-developed and well-nourished.   HENT:   Head: Normocephalic and atraumatic.   Cardiovascular: Normal rate, regular rhythm, normal heart sounds and intact distal pulses.   Pulmonary/Chest: Effort normal and breath sounds normal.   Abdominal: Soft. Bowel sounds are normal. She exhibits no distension. There is no  tenderness. There is no rebound and no guarding.   Musculoskeletal: She exhibits tenderness. She exhibits no edema or deformity.   Tenderness to the back area with light touch.  No spasm no bony point tenderness there is no step-off or redness no swelling is appreciated.  Deep tendon reflexes are 2+ distally peripheral pulses are palpable distally sensation intact distally.   Neurological: She is alert and oriented to person, place, and time. She displays normal reflexes. No sensory deficit. She exhibits normal muscle tone. Coordination normal.   Skin: Skin is warm. Capillary refill takes less than 2 seconds.   Nursing note and vitals reviewed.      MEDICAL DECISION MAKING:     Her low back pain has pain with light touch to the SI area.  States the pain radiates to the right hip.  No injury or trauma no loss of bowel or bladder control.  Physical exam shows no bony point tenderness peripheral pulses are palpable extremities are warm dry distally deep tendon reflexes 2+.  While in the emergency department we did give Tylenol she did have some relief with that.  Recommend change position frequently increase fluids use Tylenol for pain and follow-up with OB/GYN in 1 to 2 days.  Return if worse or other concerns.    DIAGNOSTIC RESULTS     EKG: All EKG's are interpreted by the Emergency Department Physician who either signs or Co-signs this chart in the absence of acardiologist.        RADIOLOGY:[place]plain film, CT, MRI, and formal ultrasound images (except ED bedside ultrasound) are read by the radiologist and the images and interpretations are directly viewed by the emergency physician.           LABS:All lab results were reviewed by myself, and all abnormals are listed below.  Labs Reviewed - No data to display      EMERGENCY DEPARTMENT COURSE:   Vitals:    Vitals:    12/29/17 1723   BP: 125/77   Pulse: 95   Resp: 16   Temp: 98.5 ??F (36.9 ??C)   TempSrc: Oral   SpO2: 100%   Weight: 149 lb (67.6 kg)   Height: 5\' 2"  (1.575  m)       The patient was  given the following medications while in the emergency department:  Orders Placed This Encounter   Medications   ??? acetaminophen (TYLENOL) tablet 650 mg       -------------------------      CRITICAL CARE:    CONSULTS:  None    PROCEDURES:  Procedures    FINAL IMPRESSION      1. Strain of lumbar region, initial encounter          DISPOSITION/PLAN   DISPOSITION Decision To Discharge 12/29/2017 06:54:19 PM      PATIENT REFERREDTO:  Beverly Gust, MD  [address]  [place] [address]  [phone]    Schedule an appointment as soon as possible for a visit in 1 day      [facility]  [address]  [place] [address]  [phone]    If symptoms worsen      DISCHARGEMEDICATIONS:  Discharge Medication List as of 12/29/2017  6:55 PM          (Please note that portions of this note were completed with a voice recognition program.  Efforts were made to edit thedictations but occasionally words are mis-transcribed.)    Karilyn Cota, PA-C                        Karilyn Cota, PA-C  12/29/17 1908

## 2017-12-29 NOTE — Telephone Encounter (Signed)
TAMEEKA/MA, FROM DR ATKINS'S OFFICE LEFT A VM @ 2:09pm 12/29/17, ASKING FOR A CALL REGARDING  PATIENT. I CALLED THE OFFICE AND SPOKE WITH WITH TAMEEKA, AND SHE WANTED TO MAKE SURE [facility] IS THE OFFICE WHERE PATIENT WAS  SCHEDULED 12/07/17 FOR A CONSULT. I TOLD TAMEEKA YES, AND SHE STATED SHE WILL CALL THE PATIENT SO SHE CAN GET RESCHEDULED.

## 2017-12-29 NOTE — ED Notes (Signed)
C= "Have you ever felt that you should Cut down on your drinking?"  No  A= "Have people Annoyed you by criticizing your drinking?"  No  G= "Have you ever felt bad or Guilty about your drinking?"  No  E= "Have you ever had a drink as an Eye-opener first thing in the morning to steady your nerves or to help a hangover?"  No      Deferred []       Reason for deferring: N/A    *If yes to two or more: probable alcohol abuse.Dan Europe*     Kenedie Dirocco M Schwamberger, RN  12/29/17 1904

## 2017-12-29 NOTE — Discharge Instructions (Addendum)
Apply cold or warm compresses to affected area  Change positions frequently  Drink plenty of fluids  Tylenol for pain if needed  Follow-up with OB/GYN for recheck in 1 day  Return if worse or other concerns

## 2017-12-29 NOTE — ED Notes (Signed)
Pt given instructions for follow-up and discharge. Pt verbalizes understanding. Pt is A&O x4, PWD, eupneic, and ambulatory with steady, even gait upon discharge.      Dan Europe, RN  12/29/17 1900

## 2017-12-31 ENCOUNTER — Ambulatory Visit: Admit: 2017-12-31 | Discharge: 2017-12-31 | Payer: MEDICAID | Attending: Gastroenterology

## 2017-12-31 DIAGNOSIS — K5901 Slow transit constipation: Secondary | ICD-10-CM

## 2017-12-31 NOTE — Progress Notes (Signed)
Subjective:      Patient ID: Brenda Zamora is a 22 y.o. female.    Dr. Bonnetta Barry primary care provider on file. has requested that I see Brenda Zamora for a consult for   1. Slow transit constipation     .    Past Medical, Family, and Social History reviewed and does contribute to the patient presenting condition.    patient"s PMH/PSH,SH,PSYCH hx, MEDs, ALLERGIES, and ROS was all reviewed and updated ion the appropriate sections      HPI  Patient seen along with her boyfriend.    Her chief complaint is that she has constipation.  She states that she has constipation for several years.  With the laxatives she is able to move bowels once in 4 to 5 days.  No diarrhea.  No hematochezia.  She does have tenesmus.  Has a nonspecific pelvic cramps and bloating.  No nausea vomiting.  No weight loss.  She denies taking narcotics.    At present patient is a 6-month pregnant.    He has good appetite.    Review of Systems   Constitutional: Positive for fatigue and unexpected weight change (fluctuating). Negative for appetite change.   HENT: Positive for sinus pressure. Negative for dental problem, postnasal drip, sore throat, trouble swallowing and voice change.    Eyes: Positive for visual disturbance (glasses).   Respiratory: Negative.  Negative for cough, choking and wheezing.    Cardiovascular: Negative.  Negative for chest pain, palpitations and leg swelling.   Gastrointestinal: Positive for abdominal pain (cramping d/t constipation), constipation, nausea and vomiting. Negative for abdominal distention, anal bleeding, blood in stool, diarrhea and rectal pain.   Genitourinary: Negative.  Negative for difficulty urinating.   Musculoskeletal: Positive for arthralgias and back pain. Negative for gait problem and myalgias.   Allergic/Immunologic: Positive for environmental allergies (seasonal). Negative for food allergies.   Neurological: Positive for light-headedness (vertigo) and headaches (sinus headaches). Negative for dizziness,  weakness and numbness.   Hematological: Bruises/bleeds easily.   Psychiatric/Behavioral: Negative.  Negative for sleep disturbance. The patient is not nervous/anxious.        Objective:   Physical Exam   Constitutional: She is oriented to person, place, and time. She appears well-developed and well-nourished.   HENT:   Head: Normocephalic and atraumatic.   No oral lesions   Eyes: Pupils are equal, round, and reactive to light. Conjunctivae are normal. No scleral icterus.   Neck: Normal range of motion. Neck supple. No hepatojugular reflux and no JVD present. No tracheal deviation present. No thyromegaly present.   Cardiovascular: Normal rate, regular rhythm, normal heart sounds and intact distal pulses.   Pulmonary/Chest: Effort normal and breath sounds normal. No respiratory distress. She has no wheezes. She has no rales.   Abdominal: Soft. Bowel sounds are normal. She exhibits no distension, no ascites and no mass. There is no hepatomegaly. There is no tenderness. There is no rebound. No hernia.   Musculoskeletal: She exhibits no edema or tenderness.   No joint swelling   Lymphadenopathy:     She has no cervical adenopathy.   Neurological: She is alert and oriented to person, place, and time. No cranial nerve deficit.   Skin: Skin is warm. No bruising, no ecchymosis and no rash noted. No erythema.   Psychiatric: Thought content normal.   Nursing note and vitals reviewed.      Assessment:       Diagnosis Orders   1. Slow transit constipation  Plan:      Patient is 2 months pregnant.  For this reason imaging studies are avoided.    Discussed with the patient regarding medical management of constipation and written instructions given.  No medications prescribed.  Hopefully with these measures constipation will get better.  Advised to see me if she has any further issues or after delivery.    Patient understood and agreed.

## 2018-01-05 NOTE — Telephone Encounter (Signed)
Patient will have [employer] pick up her proof of pregnancy and dental care forms.  She will bring in picture ID.    She also states she is [redacted] weeks pregnant and has been having cramping with no vaginal bleeding since yesterday when her 23 year old child jumped on her stomach 3 times.  Writer spoke to Dr. Mervin KungHuckaby who advised to keep an eye on cramping and if she has an vaginal bleeding to go to ER for evaluation. She said everything she probably be fine.  Shared this information with patient who was agreeable to plan.

## 2018-01-11 ENCOUNTER — Inpatient Hospital Stay
Admit: 2018-01-11 | Discharge: 2018-01-11 | Disposition: A | Discharge status: Home / Self Care | Payer: MEDICAID | Attending: Emergency Medicine

## 2018-01-11 DIAGNOSIS — K0889 Other specified disorders of teeth and supporting structures: Secondary | ICD-10-CM

## 2018-01-11 NOTE — ED Provider Notes (Signed)
[facility]     Emergency Department     Faculty Attestation    I performed a history and physical examination of the patient and discussed management with the resident. I reviewed the resident???s note and agree with the documented findings and plan of care. Any areas of disagreement are noted on the chart. I was personally present for the key portions of any procedures. I have documented in the chart those procedures where I was not present during the key portions. I have reviewed the emergency nurses triage note. I agree with the chief complaint, past medical history, past surgical history, allergies, medications, social and family history as documented unless otherwise noted below. Documentation of the HPI, Physical Exam and Medical Decision Making performed by medical students or scribes is based on my personal performance of the HPI, PE and MDM. For Physician Assistant/ Nurse Practitioner cases/documentation I have personally evaluated this patient and have completed at least one if not all key elements of the E/M (history, physical exam, and MDM). Additional findings are as noted.    Vital signs:   Vitals:    01/11/18 1021   BP: 120/74   Pulse: 95   Resp: 16   Temp: 98.5 ??F (36.9 ??C)   SpO2: 100%                  Lorelee New, M.D,  Attending Emergency  Physician           Chalmers Guest, MD  01/11/18 1101

## 2018-01-11 NOTE — ED Provider Notes (Signed)
[facility] ED  Emergency Department Encounter  EmergencyMedicine Resident     Pt Name:Brenda Zamora  MRN: [id number]  Birthdate Jun 11, 1995  Date of evaluation: 01/11/18  PCP:  Wilmer Floor, MD    CHIEF COMPLAINT       Chief Complaint   Patient presents with   ??? Jaw Pain     POST DENTAL       HISTORY OF PRESENT ILLNESS  (Location/Symptom, Timing/Onset, Context/Setting, Quality, Duration, Modifying Factors, Severity.)      Brenda Zamora is a 22 y.o. female who presents with memory complaint she is having dental pain after a root canal.  Patient reports her canal was yesterday.  She called her dentist they recommend Tylenol as she is pregnant so she cannot take any ibuprofen.  Patient admits that after numbing medication wore off she is continued to have worsening pain over the site.  Patient denies any numbness.  But admits to chewing hurts and opening her mouth fully induces pain.    PAST MEDICAL / SURGICAL / SOCIAL / FAMILY HISTORY      has a past medical history of Anemia, Asthma, Bipolar 1 disorder (HCC), Borderline personality disorder (HCC), Depression, Insomnia, PTSD (post-traumatic stress disorder), and Schizophrenia (HCC).     has a past surgical history that includes Bunionectomy and Wrist surgery.    Social History     Socioeconomic History   ??? Marital status: Single     Spouse name: Not on file   ??? Number of children: Not on file   ??? Years of education: Not on file   ??? Highest education level: Not on file   Occupational History   ??? Not on file   Social Needs   ??? Financial resource strain: Not on file   ??? Food insecurity:     Worry: Not on file     Inability: Not on file   ??? Transportation needs:     Medical: Not on file     Non-medical: Not on file   Tobacco Use   ??? Smoking status: Former Smoker     Last attempt to quit: 2015     Years since quitting: 4.8   ??? Smokeless tobacco: Never Used   Substance and Sexual Activity   ??? Alcohol use: Not Currently   ??? Drug use: Yes     Types:  Marijuana     Comment: Daily    ??? Sexual activity: Yes     Partners: Male   Lifestyle   ??? Physical activity:     Days per week: Not on file     Minutes per session: Not on file   ??? Stress: Not on file   Relationships   ??? Social connections:     Talks on phone: Not on file     Gets together: Not on file     Attends religious service: Not on file     Active member of club or organization: Not on file     Attends meetings of clubs or organizations: Not on file     Relationship status: Not on file   ??? Intimate partner violence:     Fear of current or ex partner: Not on file     Emotionally abused: Not on file     Physically abused: Not on file     Forced sexual activity: Not on file   Other Topics Concern   ??? Not on file   Social History Narrative   ???  Not on file       Family History   Problem Relation Age of Onset   ??? Diabetes Mother    ??? Hypertension Mother    ??? Diabetes Father    ??? Hypertension Father    ??? Crohn's Disease Father    ??? Other Brother         Pulmonary Hypertension    ??? Diabetes Maternal Grandmother    ??? Diabetes Paternal Grandmother        Allergies:  Rocephin [ceftriaxone]; Penicillins; Lidocaine viscous hcl; and Zithromax [azithromycin]    Home Medications:  Prior to Admission medications    Medication Sig Start Date End Date Taking? Authorizing Provider   Prenatal Multivit-Min-Fe-FA (PRENATAL VITAMINS) 0.8 MG TABS Take 27.8 mg by mouth daily 12/22/17   Charna Busman, DO   doxyLAMINE succinate (UNISOM) 25 MG tablet Take 1 tablet by mouth nightly 12/22/17   Charna Busman, DO   vitamin B-6 (PYRIDOXINE) 50 MG tablet Take 1 tablet by mouth daily 12/22/17   Progga Das, DO   IRON PO Take by mouth    Historical Provider, MD       REVIEW OF SYSTEMS    (2-9 systems for level 4, 10 or more for level 5)      Review of Systems   Constitutional: Negative for diaphoresis and fever.   HENT: Positive for dental problem. Negative for sore throat.    Respiratory: Negative for cough, shortness of breath and wheezing.     Cardiovascular: Negative for chest pain, palpitations and leg swelling.   Gastrointestinal: Negative for abdominal pain, blood in stool, constipation, diarrhea, nausea and vomiting.   Genitourinary: Negative for dysuria, frequency and hematuria.   Musculoskeletal: Negative for back pain and neck pain.   Skin: Negative for rash.   Neurological: Negative for dizziness and headaches.   Hematological: Does not bruise/bleed easily.       PHYSICAL EXAM   (up to 7 for level 4, 8 or more for level 5)      INITIAL VITALS:   BP 120/74    Pulse 95    Temp 98.5 ??F (36.9 ??C)    Resp 16    Ht 5\' 2"  (1.575 m)    Wt 149 lb (67.6 kg)    LMP 11/02/2017    SpO2 100%    BMI 27.25 kg/m??     Physical Exam   Constitutional: She is oriented to person, place, and time. She appears well-developed and well-nourished.   HENT:   Head: Normocephalic and atraumatic.   Back left molar had a root canal, no signs of any infection no abscess, patient has pain with opening her jaw as well as pain across the lower mandible.    Eyes: Pupils are equal, round, and reactive to light. Right eye exhibits no discharge. Left eye exhibits no discharge.   Neck: Normal range of motion.   Cardiovascular: Normal rate, regular rhythm and normal heart sounds. Exam reveals no gallop and no friction rub.   No murmur heard.  Pulmonary/Chest: No respiratory distress.   Abdominal: Soft. She exhibits no distension. There is no tenderness.   Musculoskeletal: Normal range of motion. She exhibits no edema, tenderness or deformity.   Neurological: She is alert and oriented to person, place, and time.   Skin: Skin is warm, dry and intact. No rash noted. She is not diaphoretic. No erythema. No pallor.   Psychiatric: She has a normal mood and affect. Her speech is normal and behavior  is normal. Judgment and thought content normal. Cognition and memory are normal.       DIFFERENTIAL  DIAGNOSIS     PLAN (LABS / IMAGING / EKG):  No orders of the defined types were placed in this  encounter.      MEDICATIONS ORDERED:  No orders of the defined types were placed in this encounter.        DIAGNOSTIC RESULTS / EMERGENCY DEPARTMENT COURSE / MDM     LABS:  No results found for this visit on 01/11/18.    RADIOLOGY:     No results found.    MDM/EMERGENCY DEPARTMENT COURSE:      ED Course as of Jan 11 1038   Tue Jan 11, 2018   1038 Patient is a healthy 22 year old female, she admits she is pregnant at this time but is complaining of jaw pain after root canal yesterday.  She is been taking Tylenol which has not been helping with the pain.  She is not able to take ibuprofen secondary to the pregnancy.  Patient admits she is been taking her amoxicillin as prescribed.  I instructed her to continue to take this as prescribed follow-up with her dentist, no obvious signs of infection on exam root canal site looks intact.  Has pain with opening her mouth however she is able to do so     [KW]      ED Course User Index  [KW] Lavella Hammock, DO         PROCEDURES:  None    CONSULTS:  None    CRITICAL CARE:  None    FINAL IMPRESSION      1. Pain, dental          DISPOSITION / PLAN     DISPOSITION Decision To Discharge    PATIENT REFERRED TO:  Wilmer Floor, MD  [address]  [place] [address]  [phone]    Schedule an appointment as soon as possible for a visit   For Follow Up      Please call and follow-up with your dentist.          DISCHARGE MEDICATIONS:  New Prescriptions    No medications on file       Lavella Hammock, DO  Emergency Medicine Resident    (Please note that portions of this note were completed with a voicerecognition program.  Efforts were made to edit the dictations but occasionally words are mis-transcribed.)       Lavella Hammock, DO  01/11/18 1039

## 2018-01-11 NOTE — Discharge Instructions (Signed)
Please call and follow-up with your dentist, Tylenol for pain.  Return if swelling should worsen.  Continue take antibiotics as prescribed by her dentist.

## 2018-01-11 NOTE — ED Notes (Signed)
Patient noted with left sided jaw pain post root canal yesterday. Patient says that she is having a very hard time moving her jaw. No deformity/ edema noted to the area. Vitals noted as recorded.     Jabier GaussNed Leeandra Ellerson, RN  01/11/18 1120

## 2018-01-13 NOTE — Telephone Encounter (Signed)
Patient called, states that she had dental work done on Monday, her jaw is locking and having pain.  OTC Tylenol is not helping with the pain, she is about [redacted] weeks pregnant, has OB ED on 01/24/18 with [facility].  Patient states that the dentist is not comfortable prescribing her any narcotic medications and to call our office.      Per [facility] verbal, we will not prescribe narcotic medications, we can send the dentist a dental note stating what medication(s) patient can receive in pregnancy if she wishes.     I called the patient back, informed patient of process.  Patient notes that the dentist at [facility] already has something like that but would like another copy faxed to their office.    I faxed copy of dental note to [facility] at patient request.  Patient acknowledged understanding and thanked this MA

## 2018-01-24 ENCOUNTER — Other Ambulatory Visit: Admit: 2018-01-24 | Discharge: 2018-01-24 | Payer: MEDICAID | Attending: Gynecology

## 2018-01-24 DIAGNOSIS — O0991 Supervision of high risk pregnancy, unspecified, first trimester: Secondary | ICD-10-CM
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
## 2018-01-27 ENCOUNTER — Ambulatory Visit: Admit: 2018-01-27 | Discharge: 2018-01-27 | Payer: MEDICAID | Attending: Maternal & Fetal Medicine

## 2018-01-27 ENCOUNTER — Inpatient Hospital Stay: Payer: MEDICAID

## 2018-01-27 DIAGNOSIS — O3680X0 Pregnancy with inconclusive fetal viability, not applicable or unspecified: Secondary | ICD-10-CM

## 2018-01-27 DIAGNOSIS — O0991 Supervision of high risk pregnancy, unspecified, first trimester: Secondary | ICD-10-CM

## 2018-01-27 LAB — URINE DRUG SCREEN
Amphetamine Screen, Ur: NEGATIVE
Barbiturate Screen, Ur: NEGATIVE
Benzodiazepine Screen, Urine: NEGATIVE
Cannabinoid Scrn, Ur: NEGATIVE
Cocaine Metabolite, Urine: NEGATIVE
Methadone Screen, Urine: NEGATIVE
Opiates, Urine: NEGATIVE
Oxycodone Screen, Ur: NEGATIVE
Phencyclidine, Urine: NEGATIVE

## 2018-01-27 LAB — GLUCOSE TOLERANCE, 1 HOUR: Glucose tolerance screen 50g: 99 mg/dL (ref 70–135)

## 2018-01-27 NOTE — Patient Instructions (Signed)
Pt to follow up with referring physician

## 2018-01-27 NOTE — Other (Signed)
Normal result. No further action. Continue routine care.

## 2018-01-27 NOTE — Other (Signed)
Results suggests iron deficiency anemia. Order entered for additional iron studies. Please notify the patient.

## 2018-01-27 NOTE — Progress Notes (Signed)
Please refer to attached ultrasound report for doctor's evaluation of the clinical information obtained by vital signs, ultrasound, and/or non-stress test along with management recommendation.

## 2018-01-28 LAB — HEMOGLOBINOPATHY EVALUATION

## 2018-01-31 LAB — CYSTIC FIBROSIS

## 2018-02-01 ENCOUNTER — Encounter

## 2018-02-08 ENCOUNTER — Other Ambulatory Visit
Admit: 2018-02-08 | Discharge: 2018-02-08 | Payer: MEDICAID | Attending: Student in an Organized Health Care Education/Training Program

## 2018-02-08 DIAGNOSIS — O0992 Supervision of high risk pregnancy, unspecified, second trimester: Secondary | ICD-10-CM

## 2018-02-08 MED ORDER — FERROUS SULFATE 325 (65 FE) MG PO TBEC
325 | ORAL_TABLET | Freq: Two times a day (BID) | ORAL | 3 refills | Status: DC
Start: 2018-02-08 — End: 2018-07-19

## 2018-02-08 MED ORDER — ASPIRIN EC 81 MG PO TBEC
81 MG | ORAL_TABLET | Freq: Every day | ORAL | 5 refills | Status: AC
Start: 2018-02-08 — End: ?

## 2018-02-08 MED ORDER — PRENATAL ADULT GUMMY/DHA/FA 0.4-25 MG PO CHEW
0.4-25 | ORAL_TABLET | Freq: Every day | ORAL | 3 refills | 180.00000 days | Status: DC
Start: 2018-02-08 — End: 2018-07-19

## 2018-02-08 NOTE — Progress Notes (Signed)
Prenatal Visit    Brenda Zamora is a 22 y.o. female [phone] at [redacted]w[redacted]d   Subjective:  The patient was seen and evaluated. She complained of persistent nausea. She has been able to tolerate PO intake. She admits to taking the B6 and Unisom once a day. She denies contractions, vaginal bleeding and leakage of fluid. Signs and symptoms of preterm labor as well as labor were reviewed. Dates were reviewed with the patient. Estimated Date of Delivery: 08/09/18        The patient already received the influenza vaccine this year.     The problem list reflects the active issues addressed during today's visit    VITALS:  BP: 137/89  Weight: 152 lb (68.9 kg)  Pulse: 109  Fetal Heart Rate: 155     PRENATAL LAB RESULTS:   Blood Type/Rh:    ABO/Rh   Date Value Ref Range Status   12/22/2017 O POSITIVE  Final     Antibody Screen:    Antibody Screen   Date Value Ref Range Status   12/22/2017 NEGATIVE  Final     Hemoglobin:   Hemoglobin   Date Value Ref Range Status   12/22/2017 9.4 (L) 11.9 - 15.1 g/dL Final     Hematocrit:   Hematocrit   Date Value Ref Range Status   12/22/2017 33.1 (L) 36.3 - 47.1 % Final     Platelet Count:   Platelets   Date Value Ref Range Status   12/22/2017 367 138 - 453 k/uL Final     Rubella:    Rubella Antibody, IGG   Date Value Ref Range Status   12/22/2017 168.8 IU/mL Final     Comment:                 REFERENCE RANGE:  <5.0       NON-REACTIVE (non-immune)  5.0 TO 9.9 EQUIVOCAL  >=10.0     REACTIVE     (immune)             T. Pallidium, IGG:    T. pallidum, IgG   Date Value Ref Range Status   12/22/2017 NONREACTIVE NONREACTIVE Final     Comment:           T. pallidum antibodies are not detected.  There is no serological evidence of infection with T. pallidum (early primary syphilis   cannot be excluded).  Retest in 2-4 weeks if syphilis is clinically suspect.             Sickle Cell Screen: No results found for: SICKLE  Hepatitis B Surface Antigen:   Hepatitis B Surface Ag   Date Value Ref Range Status    12/22/2017 NONREACTIVE NONREACTIVE Final     HIV:  No results found for: HIV12AB    Blood Type/Rh: O pos  Antibody Screen: negative  Hemoglobin, Hematocrit, Platelets: 9.4 / 33.1 / 367  Rubella: immune  T. Pallidum, IgG: negative   Hepatitis B Surface Antigen: non-reactive   HIV: negative   Sickle Cell Screen: negative  Gonorrhea: negative   Chlamydia: negative  Pap: pending  UA w/ Urine C&S: negative  Cystic Fibrosis Screen:  negative  First Trimester Screen:  completed, low risk  MSAFP/Multiple Markers:  Not done  Anatomy [place] will complete 04/07/18    Assessment & Plan:  KLatonda Larriveeis a 22y.o. female [id number] [id number] - An 18-22 week anatomy ultrasound has been ordered   - The first trimester screen was found  to be low risk   - MSAFP/Quad Screen will be ordered for a 15-[redacted] week gestational age window.    - Patient given rx for prenatal vitamin, baby aspirin, and iron   - Using B6 and Unisom for nausea, counseled that she can take twice a day to help with symptoms, encouraged taking PNV for symptoms, overall appropriate PO intake   - Patient to complete iron studies, lab slips printed   - Early 1hr GTT of 99   - Patient had pap smear in last year, declines pap today and prefers to get records sent to the clinic   - Patient received flu shot   - Return in 3 weeks or sooner if necessary    Patient Active Problem List    Diagnosis Date Noted   ??? Family history of diabetes mellitus 01/27/2018     Mom, dad, MGM, PGM  Early 1hr completed: 44     ??? Family history polydactyly  01/27/2018     Sister had a finger removed on each hand as a child      ??? Seizure in childhood [employer]) 01/27/2018     Had childhood seizures. Does not see a neurologist or take any meds      ??? Asthma 01/27/2018   ??? Anxiety 01/24/2018   ??? Personality disorder ([place]) 01/24/2018   ??? History of behavioral and mental health problems 01/24/2018     01/24/18- Pt met with clinic SW today.  Enrolled in [facility].  Pt is not taking and medications currently.   See SW note.       ??? High-risk pregnancy, first trimester 01/24/2018     01/24/18- MFM referral placed for first trimester screening and anatomy scan   Please sent RX  at next visiti for pt to start ASA as she meets Mod risk factors per protoco.- SK      ??? Iron deficiency anemia 01/24/2018     Pt taking Iron daily, RX by her PCP       Pt reports iron infusions in first preg and blood transfusion during preg.     Patient stated in her first pregnancy she needed 2 blood transfusions and IV iron infusions prior to pregnancy.   Her second pregnancy she needed a blood transfusion after delivery.      ??? Vitamin D deficiency 01/24/2018     Pt taking supplements RX by PCP     ??? Mild tetrahydrocannabinol (THC) abuse 01/24/2018     Pt counseled not recommended in pregnancy.  Maternal/Fetal risks reviewed. Pt verbalizes understanding.    01/27/18: quit October 2019     ??? Smoker 01/24/2018     Pt reports quitting.  Pt counseled on maternal/fetal risk factors.  Pt verbalizes understanding      ??? Homelessness 01/24/2018     Pt and her children live with the FOB mother      ??? Slow transit constipation 12/31/2017     Pt manges with stool softer      ??? Schizophrenia ([place])      Was taking Trazodone, Seroqeul and Celexa. Stopped taking medications July 2019      ??? PTSD (post-traumatic stress disorder)    ??? Depression    ??? Bipolar 1 disorder ([place])    ??? Asthma      Takes albuterol inhaler and nebulizer PRN  PCP gives her Rx  Has been hospitalized in the past for asthmatic attack        Return in about 3  weeks (around 03/01/2018) for ROB.    Myrtie Hawk, DO  Ob/Gyn Resident  [facility], [facility]  02/08/2018, 12:50 PM

## 2018-02-08 NOTE — Progress Notes (Signed)
Date: 02/08/2018  Time: 12:18 PM      Patient Name: Brenda Zamora [place]  Patient DOB: 08/10/1995  Room/Bed: Room/bed info not found  Admission Date/Time: No admission date for patient encounter.        Attending Physician Statement  I have discussed the care of Brenda Zamora Irigoyen, including pertinent history and exam findings with the resident. I have reviewed and edited their note in the electronic medical record. The key elements of all parts of the encounter have been performed/reviewed by me .  I agree with the assessment, plan and orders as documented by the resident.     The level of care submitted represents to the best of my ability the care documented in the medical record today.    GE Modifier.  This service has been performed in part by a resident under the direction of a teaching physician.        Attending's Name:  Lucie LeatherKelly E Reyansh Kushnir, DO

## 2018-02-22 ENCOUNTER — Inpatient Hospital Stay
Admit: 2018-02-22 | Discharge: 2018-02-22 | Disposition: A | Discharge status: Home / Self Care | Payer: MEDICAID | Attending: Emergency Medicine

## 2018-02-22 ENCOUNTER — Emergency Department: Admit: 2018-02-22 | Payer: MEDICAID

## 2018-02-22 DIAGNOSIS — O26892 Other specified pregnancy related conditions, second trimester: Secondary | ICD-10-CM

## 2018-02-22 LAB — URINALYSIS WITH MICROSCOPIC
Bilirubin Urine: NEGATIVE
Casts UA: 0 /LPF (ref 0–8)
Epithelial Cells, UA: 2 /[HPF] (ref 0–5)
Glucose, Ur: NEGATIVE
Ketones, Urine: NEGATIVE
Nitrite, Urine: NEGATIVE
Protein, UA: NEGATIVE
Specific Gravity, UA: 1.019 (ref 1.005–1.030)
Urine Hgb: NEGATIVE
Urobilinogen, Urine: NORMAL
WBC, UA: 2 /HPF (ref 0–5)
pH, UA: 7 (ref 5.0–8.0)

## 2018-02-22 LAB — COMPREHENSIVE METABOLIC PANEL
ALT: 9 U/L (ref 5–33)
AST: 23 U/L (ref <32)
Albumin/Globulin Ratio: 0.8 — ABNORMAL LOW (ref 1.0–2.5)
Albumin: 3.4 g/dL — ABNORMAL LOW (ref 3.5–5.2)
Alkaline Phosphatase: 66 U/L (ref 35–104)
Anion Gap: 12 mmol/L (ref 9–17)
BUN: 10 mg/dL (ref 6–20)
CO2: 20 mmol/L (ref 20–31)
Calcium: 9.4 mg/dL (ref 8.6–10.4)
Chloride: 102 mmol/L (ref 98–107)
Creatinine: 0.54 mg/dL (ref 0.50–0.90)
GFR African American: >60 mL/min (ref >60)
GFR Non-African American: >60 mL/min (ref >60)
Glucose: 86 mg/dL (ref 70–99)
Potassium: 4.2 mmol/L (ref 3.7–5.3)
Sodium: 134 mmol/L — ABNORMAL LOW (ref 135–144)
Total Bilirubin: 0.15 mg/dL — ABNORMAL LOW (ref 0.3–1.2)
Total Protein: 7.5 g/dL (ref 6.4–8.3)

## 2018-02-22 LAB — CBC
Hematocrit: 31.3 % — ABNORMAL LOW (ref 36.3–47.1)
Hemoglobin: 9.5 g/dL — ABNORMAL LOW (ref 11.9–15.1)
MCH: 22.9 pg — ABNORMAL LOW (ref 25.2–33.5)
MCHC: 30.4 g/dL (ref 28.4–34.8)
MCV: 75.6 fL — ABNORMAL LOW (ref 82.6–102.9)
MPV: 11.4 fL (ref 8.1–13.5)
NRBC Automated: 0 per 100 WBC
Platelets: 296 10*3/uL (ref 138–453)
RBC: 4.14 m/uL (ref 3.95–5.11)
RDW: 18.6 % — ABNORMAL HIGH (ref 11.8–14.4)
WBC: 6.7 10*3/uL (ref 3.5–11.3)

## 2018-02-22 LAB — RAPID INFLUENZA A/B ANTIGENS

## 2018-02-22 LAB — C-REACTIVE PROTEIN: CRP: 5 mg/L (ref 0.0–5.0)

## 2018-02-22 LAB — LIPASE: Lipase: 24 U/L (ref 13–60)

## 2018-02-22 MED ORDER — SODIUM CHLORIDE 0.9 % IV BOLUS
0.9 % | Freq: Once | INTRAVENOUS | Status: DC
Start: 2018-02-22 — End: 2018-02-22

## 2018-02-22 MED ORDER — PROMETHAZINE HCL 25 MG/ML IJ SOLN
25 MG/ML | Freq: Once | INTRAMUSCULAR | Status: AC
Start: 2018-02-22 — End: 2018-02-22
  Administered 2018-02-22: 15:00:00 25 mg via INTRAMUSCULAR

## 2018-02-22 MED FILL — PROMETHAZINE HCL 25 MG/ML IJ SOLN: 25 mg/mL | INTRAMUSCULAR | Qty: 1

## 2018-02-22 NOTE — ED Notes (Signed)
Pt back from ultrasound     Clydie Braunebecca A Safiatou Islam, RN  02/22/18 [phone]

## 2018-02-22 NOTE — Progress Notes (Signed)
EARLY PREGNANCY ULTRASOUND:  A limited, bedside pelvic ultrasound was performed using a transabdominal probe.  The medical necessity was to evaluate for signs of an intrauterine versus ectopic pregnancy.  The structures studied were the uterus and its contents, bladder, ovaries, vesicouterine space, and rectouterine space.    FINDINGS:  Evidence for an IUP was seen.   fetus with cardiac motion and spontaneous movement  The study was technically adequate.

## 2018-02-22 NOTE — Discharge Instructions (Signed)
Results - At this point all testing did not show a cause of your symptoms. Therefore we do not  know exactly what is causing your symptoms. However because of this it is extremely important  to monitor them closely as they can change be the first sign of a something worse.  Treatment - tylenol for pain, increase fluid intake to prevent dehydration  Return to ED for - worsening pain, fevers, chills, nausea, vomiting, inability to eat or drink,  lightheadedness, dizziness, weakness, burning with urination, increased urinary frequency,  cough, chest pain, headache  Follow up - With your OB/GYN clinic and primary care physician within 2 days if no better or worse. It is important to  be reevaluated by your doctor in the next 1-2 days as your symptoms could be the beginning of  a life-threatening or surgical disease process. However at this point there is no obvious source  of your symptoms.  You have been seen, evaluated, treated, and reevaluated in the emergency department. Your  test results and diagnoses have been explained to you . You have expressed understanding of  the results, your diagnosis, and its implications. All of your questions have been answered.  Please read your discharge instructions carefully and follow them as directed. You have been  provided access to your imaging and lab results and had any abnormalities explained, please  take them to your doctor as they may require follow up as we discussed.  It is important to remember that your care does not end here and you must continue to monitor  your condition closely. Please return to the emergency department for any worsening or  concerning signs or symptoms as directed by our conversations and the discharge instructions.  Otherwise please follow up with your doctor in 2 days if no better or worse. If you do not have a  doctor please contact the hospital of your choice. Please contact any physician specialists  provided in your discharge notes as it  is very important to follow up with them regarding your  condition. If you are unable to reach the physicians provided, please come back to the  Emergency Department at any time.

## 2018-02-22 NOTE — ED Provider Notes (Signed)
[facility] ED  Emergency Department Encounter  EmergencyMedicine Resident     Pt Name:Brenda Zamora  MRN: [id number]  [place] 1995-11-17  Date of evaluation: 02/22/18  PCP:  Lucio Edward, MD    [place]       Chief Complaint   Patient presents with   ??? Chest Pain   ??? Abdominal Pain       HISTORY OF PRESENT ILLNESS  (Location/Symptom, Timing/Onset, Context/Setting, Quality, Duration, Modifying Factors, Severity.)    Patient 16 weeks +4 days G4, P2 A1 here with 2 days of right upper quadrant pain as well as mild intermittent shortness of breath and sharp anterior chest wall pain.  Shortness of breath chest wall pain is not exertional not relieved with rest patient did not get sweaty or vomit.  Patient has baseline nausea that is unchanged from her whole first trimester pregnancy for which she takes B6 and doxylamine for.  Patient states her chest pain is mostly when she turns over in bed.  No near syncope no syncope no vision changes no dizziness no focal weakness in extremities although patient does say her right foot and toes occasionally gets numb the pain which when she is sitting    Abdominal pain is 5 out of 10 right upper quadrant she is tolerating some oral intake however is nauseous.  Having normal bowel movements having increased urinary frequency no hematuria no vaginal discharge or irritation, no vaginal bleeding or gush of fluid      PAST MEDICAL / SURGICAL / SOCIAL / FAMILY HISTORY     Past medical and surgical history reviewed by nursing    Social history reviewed by nursing    Allergies reviewed by nursing    Home Medications:  Prior to Admission medications    Medication Sig Start Date End Date Taking? Authorizing Provider   ferrous sulfate (FE TABS) 325 (65 Fe) MG EC tablet Take 1 tablet by mouth 2 times daily 02/08/18   Myrtie Hawk, DO   aspirin EC 81 MG EC tablet Take 1 tablet by mouth daily 02/08/18   Myrtie Hawk, DO   Prenatal MV & Min w/FA-DHA (PRENATAL ADULT  GUMMY/DHA/FA) 0.4-25 MG CHEW Take 1 tablet by mouth daily 02/08/18   Myrtie Hawk, DO   clindamycin (CLEOCIN) 150 MG capsule take 1 capsule by mouth three times a day until finished 01/08/18   Historical Provider, MD   ferrous sulfate 325 (65 Fe) MG tablet Take 325 mg by mouth daily (with breakfast)    Historical Provider, MD   docusate sodium (COLACE) 100 MG capsule Take 100 mg by mouth 2 times daily as needed for Constipation    Historical Provider, MD   Prenatal Multivit-Min-Fe-FA (PRENATAL VITAMINS) 0.8 MG TABS Take 27.8 mg by mouth daily  Patient not taking: Reported on 01/24/2018 12/22/17   Dalbert Garnet, DO   doxyLAMINE succinate (UNISOM) 25 MG tablet Take 1 tablet by mouth nightly  Patient not taking: Reported on 01/27/2018 12/22/17   Dalbert Garnet, DO   vitamin B-6 (PYRIDOXINE) 50 MG tablet Take 1 tablet by mouth daily  Patient not taking: Reported on 01/27/2018 12/22/17   Dalbert Garnet, DO       REVIEW OF SYSTEMS    (2-9 systems for level 4, 10 or more for level 5)      General ROS - No fevers, No chills, no gradual weight loss, no night sweats  Ophthalmic ROS - No discharge, No changes in vision  ENT ROS -  No sore throat, No rhinorrhea,   Respiratory ROS - no shortness of breath, no cough, no  wheezing  Cardiovascular ROS - positive chest pain, positive dyspnea on exertion  Gastrointestinal ROS - positive abdominal pain, positive nausea, positive vomiting, no change in bowel habits, no black or bloody stools  Genito-Urinary ROS - No dysuria, trouble voiding, or hematuria  Musculoskeletal ROS - No myalgias, No arthalgias  Neurological ROS - No headache, no dizziness/lightheadedness, No focal weakness, no loss of sensation  Dermatological ROS - No lesions, No rash         PHYSICAL EXAM   (up to 7 for level 4, 8 or more for level 5)      INITIAL VITALS:   BP 113/68    Pulse 99    Temp 98.1 ??F (36.7 ??C) (Oral)    Resp 16    Ht '5\' 2"'  (1.575 m)    Wt 150 lb (68 kg)    LMP 11/02/2017 (Exact Date)    SpO2 99%    BMI 27.44  kg/m??     General Appearance: Well-appearing, in no acute distress  HEENT: Head: normocephalic/atraumatic eyes: PERRLA, EOMT, conjunctiva not injected, sclerae nonicteric ears: External canals patent nose: Nares patent, no rhinorrhea, throat:mucous membranes moist, oropharynx clear     Neck: Trachea midline, no JVD.    Lungs: No evidence of increased work of breathing.  CTA B/L, no wheezes/rhonchi     Cardiovascular: RRR, no murmur, 2+ peripheral pulses bilaterally.  Cap refill less than 2 seconds.  No lower extremity edema noted    Abdomen: Soft, tenderness in the right upper quadrant negative Murphy sign no pain or McBurney's point no CVA tenderness.  No guarding or rebound tenderness.     Neurologic: CAO  to person, place, time, and event.   No sensation deficits.  Moving all extremities    Extremities: Skin warm, dry and intact.      DIFFERENTIAL  DIAGNOSIS     PLAN (LABS / IMAGING / EKG):  Orders Placed This Encounter   Procedures   ??? Urine Culture   ??? RAPID INFLUENZA A/B ANTIGENS   ??? XR CHEST STANDARD (2 VW)   ??? US GALLBLADDER RUQ   ??? Urinalysis with Microscopic   ??? CBC   ??? Comprehensive Metabolic Panel   ??? LIPASE   ??? C-Reactive Protein   ??? EKG 12 Lead       MEDICATIONS ORDERED:  Orders Placed This Encounter   Medications   ??? DISCONTD: 0.9 % sodium chloride bolus   ??? promethazine (PHENERGAN) injection 25 mg           DIAGNOSTIC RESULTS / EMERGENCY DEPARTMENT COURSE / MDM     LABS:  Results for orders placed or performed during the hospital encounter of 02/22/18   RAPID INFLUENZA A/B ANTIGENS   Result Value Ref Range    Specimen Description .NASOPHARYNGEAL SWAB     Special Requests NOT REPORTED     Direct Exam       PRESUMPTIVE NEGATIVE for Influenza A + B antigens.                                PCR testing to confirm this result is available upon request.  Specimen will be saved in the laboratory for 7 days.  Please call [phone] if PCR testing is indicated.   Urinalysis with Microscopic   Result Value  Ref Range    Color, UA YELLOW YELLOW    Turbidity UA CLEAR CLEAR    Glucose, Ur NEGATIVE NEGATIVE    Bilirubin Urine NEGATIVE NEGATIVE    Ketones, Urine NEGATIVE NEGATIVE    Specific Gravity, UA 1.019 1.005 - 1.030    Urine Hgb NEGATIVE NEGATIVE    pH, UA 7.0 5.0 - 8.0    Protein, UA NEGATIVE NEGATIVE    Urobilinogen, Urine Normal Normal    Nitrite, Urine NEGATIVE NEGATIVE    Leukocyte Esterase, Urine TRACE (A) NEGATIVE    -          WBC, UA 2 TO 5 0 - 5 /HPF    RBC, UA None 0 - 4 /HPF    Casts UA  0 - 8 /LPF     0 TO 2 HYALINE Reference range defined for non-centrifuged specimen.    Crystals UA NOT REPORTED None /HPF    Epithelial Cells UA 2 TO 5 0 - 5 /HPF    Renal Epithelial, Urine NOT REPORTED 0 /HPF    Bacteria, UA NOT REPORTED None    Mucus, UA NOT REPORTED None    Trichomonas, UA NOT REPORTED None    Amorphous, UA NOT REPORTED None    Other Observations UA NOT REPORTED NOT REQ.    Yeast, UA NOT REPORTED None   CBC   Result Value Ref Range    WBC 6.7 3.5 - 11.3 k/uL    RBC 4.14 3.95 - 5.11 m/uL    Hemoglobin 9.5 (L) 11.9 - 15.1 g/dL    Hematocrit 31.3 (L) 36.3 - 47.1 %    MCV 75.6 (L) 82.6 - 102.9 fL    MCH 22.9 (L) 25.2 - 33.5 pg    MCHC 30.4 28.4 - 34.8 g/dL    RDW 18.6 (H) 11.8 - 14.4 %    Platelets 296 138 - 453 k/uL    MPV 11.4 8.1 - 13.5 fL    NRBC Automated 0.0 0.0 per 100 WBC   Comprehensive Metabolic Panel   Result Value Ref Range    Glucose 86 70 - 99 mg/dL    BUN 10 6 - 20 mg/dL    CREATININE 0.54 0.50 - 0.90 mg/dL    Bun/Cre Ratio NOT REPORTED 9 - 20    Calcium 9.4 8.6 - 10.4 mg/dL    Sodium 134 (L) 135 - 144 mmol/L    Potassium 4.2 3.7 - 5.3 mmol/L    Chloride 102 98 - 107 mmol/L    CO2 20 20 - 31 mmol/L    Anion Gap 12 9 - 17 mmol/L    Alkaline Phosphatase 66 35 - 104 U/L    ALT 9 5 - 33 U/L    AST 23 <32 U/L    Total Bilirubin 0.15 (L) 0.3 - 1.2 mg/dL    Total Protein 7.5 6.4 - 8.3 g/dL    Alb 3.4 (L) 3.5 - 5.2 g/dL    Albumin/Globulin Ratio 0.8 (L) 1.0 - 2.5    GFR Non-African American >60 >60  mL/min    GFR African American >60 >60 mL/min    GFR Comment          GFR Staging NOT REPORTED    LIPASE   Result Value Ref Range    Lipase 24 13 - 60 U/L   C-Reactive Protein   Result Value Ref Range    CRP 5.0 0.0 - 5.0 mg/L   EKG 12 Lead   Result Value  Ref Range    Ventricular Rate 101 BPM    Atrial Rate 101 BPM    P-R Interval 118 ms    QRS Duration 72 ms    Q-T Interval 334 ms    QTc Calculation (Bazett) 433 ms    P Axis 17 degrees    R Axis 52 degrees    T Axis 41 degrees           RADIOLOGY:  No results found.    EKG  None    All EKG's are interpreted by the Emergency Department Physician who either signs or Co-signs this chart in the absence of a cardiologist.    EMERGENCY DEPARTMENT COURSE/IMPRESSION:  Patient 16 weeks +4 days G4, P2 A1 here with 2 days of right upper quadrant pain as well as mild intermittent shortness of breath and sharp anterior chest wall pain.  Shortness of breath chest wall pain is not exertional not relieved with rest patient did not get sweaty or vomit.  Patient has baseline nausea that is unchanged from her whole first trimester pregnancy for which she takes B6 and doxylamine for.  Patient states her chest pain is mostly when she turns over in bed.  No near syncope no syncope no vision changes no dizziness no focal weakness in extremities although patient does say her right foot and toes occasionally gets numb the pain which when she is sitting    Abdominal pain is 5 out of 10 right upper quadrant she is tolerating some oral intake however is nauseous.  Having normal bowel movements having increased urinary frequency no hematuria no vaginal discharge or irritation, no vaginal bleeding or gush of fluid    Patient has no surgical history.    Vital signs show mild tachycardia however no shortness of breath satting 90% no increased work of breathing afebrile.  Patient does admit to mild sore throat cough runny nose 2 members in her family positive for flu.    We will check rapid flu  chest x-ray EKG.  Abdominal labs urine urine culture right upper quadrant ultrasound expect discharge if work-up negative tolerating p.o.  ED Course as of Feb 22 1810   Tue Feb 22, 2018   0912 DIRECT EXAM.: PRESUMPTIVE NEGATIVE for Influenza A + B antigens.                                PCR testing to confirm this result is available upon request.  Specimen will be saved in the laboratory for 7 days.  Please call [phone] if PCR testing is indicated. [EF]   0912 Alk Phos: 66 [EF]   0981 ALT: 9 [EF]   0912 AST: 23 [EF]   0912 Hemoglobin Quant(!): 9.5 [EF]   0913 Nitrite, Urine: NEGATIVE [EF]   0913 WBC, UA: 2 TO 5 [EF]   0913 Fetal heart tones 162 on bedside ultrasound with good fetal movement awaiting formal right upper quadrant ultrasound this time no transaminitis hemoglobin at or above baseline urine shows no signs of infection if ultrasound negative expect discharge   Bacteria, UA: NOT REPORTED [EF]   1914 DIRECT EXAM.: PRESUMPTIVE NEGATIVE for Influenza A + B antigens.                                PCR testing to confirm this result is available upon request.  Specimen will be  saved in the laboratory for 7 days.  Please call [phone] if PCR testing is indicated. [EF]   5573 WBC: 6.7 [EF]   1104 Patient having improvement after Phenergan, recheck of abdomen is soft no specific tenderness abdominal labs including upper quadrant ultrasound negative tolerating p.o. intake will discharge with PCP follow-up and return precautions specific for appendicitis    [EF]   1105 EKGEKG Interpretation Interpreted by emergency department physician Rhythm: normal sinus Rate: normalAxis: normalEctopy: noneConduction: normalST Segments: no acute changeT Waves: no acute changesQ Waves: none Clinical Impression: no acute changes    [EF]   1107 Spoke to OB team they state patient is okay to go home no further intervention can follow-up with them    [EF]      ED Course User Index  [EF] Tyrone Sage, DO        PROCEDURES:  None    CONSULTS:  None    CRITICAL CARE:  None    FINAL IMPRESSION      1. Abdominal pain, unspecified abdominal location          DISPOSITION / PLAN     DISPOSITION Decision To Discharge 02/22/2018 11:08:30 AM      PATIENT REFERRED TO:  [facility]  [address]  [place] [address]  [phone]  Schedule an appointment as soon as possible for a visit in 3 days  recheck of your symptoms    [facility]  [place] [address]  [phone]  Go to   If symptoms worsen      DISCHARGE MEDICATIONS:  Discharge Medication List as of 02/22/2018 11:08 AM          Tyrone Sage, DO  Jyl Chico, D.O.  Emergency Medicine Resident    (Please note that portions of this note were completed with a voice recognition program.  Efforts were made to edit the dictations but occasionally words aremis-transcribed.)       Tyrone Sage, DO  Resident  02/22/18 1811

## 2018-02-22 NOTE — Progress Notes (Incomplete)
HPI    The patient is a 22 year old female [id number] with PMH Asthma, anemia who presents to the ED with multiple complaints. She states for the previous 3 days she has had lower abdominal pain that feels like cramping. She states she has not had dysuria or hematuria but has episodes of urge incontinence. She has had associated nausea with 2-3 episodes of non-bloody, non-bilious vomiting but states that nausea is present throughout all of her pregnancies. She has not had any episodes of vaginal bleeding. She states for the previous 3 days she has also had chest pain that radiates to her left side. The pain is associated with shortness of breath and is brought on by movement. It is not present at rest. She has not taken any medicine for the pain. She does have a history of requiring blood and iron transfusions in pregnancy. She also states she has had a headache, sore throat, and runny nose for the previous 3 days. She states her son and fiance have tested positive for influenza and are being treated with tamiflu, but she did not receive treatment.

## 2018-02-22 NOTE — ED Provider Notes (Signed)
[facility]     Emergency Department     Faculty Note/ Attestation      Pt Name: Brenda Zamora                                       MRN: [id number]  Birthdate 03/04/1996  Date of evaluation: 02/22/2018  Patients PCP:    Wilmer FloorAnthony D Atkins, MD    Attestation  I performed a history and physical examination of the patient and discussed management with the resident. I reviewed the resident???s note and agree with the documented findings and plan of care. Any areas of disagreement are noted on the chart. I was personally present for the key portions of any procedures. I have documented in the chart those procedures where I was not present during the key portions. I have reviewed the emergency nurses triage note. I agree with the chief complaint, past medical history, past surgical history, allergies, medications, social and family history as documented unless otherwise noted below.    For Physician Assistant/ Nurse Practitioner cases/documentation I have personally evaluated this patient and have completed at least one if not all key elements of the E/M (history, physical exam, and MDM). Additional findings are as noted.    Initial Screens:        Glasgow Coma Scale  Eye Opening: Spontaneous  Best Verbal Response: Oriented  Best Motor Response: Obeys commands  Glasgow Coma Scale Score: 15    Vitals:    Vitals:    02/22/18 0707   BP: 113/68   Pulse: 99   Resp: 16   Temp: 98.1 ??F (36.7 ??C)   TempSrc: Oral   SpO2: 99%   Weight: 150 lb (68 kg)   Height: 5\' 2"  (1.575 m)       Chief Complaint      Chief Complaint   Patient presents with   ??? Chest Pain   ??? Abdominal Pain          height is 5\' 2"  (1.575 m) and weight is 150 lb (68 kg). Her oral temperature is 98.1 ??F (36.7 ??C). Her blood pressure is 113/68 and her pulse is 99. Her respiration is 16 and oxygen saturation is 99%.            DIAGNOSTIC RESULTS       RADIOLOGY:   XR CHEST STANDARD (2 VW)   Final Result   Unremarkable chest.         US GALLBLADDER  RUQ   Final Result   *Mild right hydronephrosis and proximal hydroureter which may relate to   gravid uterus.   *Otherwise negative right upper quadrant ultrasound with unremarkable   sonographic appearance of the gallbladder.               LABS:  Labs Reviewed   URINALYSIS WITH MICROSCOPIC - Abnormal; Notable for the following components:       Result Value    Leukocyte Esterase, Urine TRACE (*)     All other components within normal limits   CBC - Abnormal; Notable for the following components:    Hemoglobin 9.5 (*)     Hematocrit 31.3 (*)     MCV 75.6 (*)     MCH 22.9 (*)     RDW 18.6 (*)     All other components within normal limits   COMPREHENSIVE METABOLIC PANEL - Abnormal; Notable  for the following components:    Sodium 134 (*)     Total Bilirubin 0.15 (*)     Alb 3.4 (*)     Albumin/Globulin Ratio 0.8 (*)     All other components within normal limits   RAPID INFLUENZA A/B ANTIGENS   URINE CULTURE   LIPASE   C-REACTIVE PROTEIN         EMERGENCY DEPARTMENT COURSE:     -------------------------      BP: 113/68, Temp: 98.1 ??F (36.7 ??C), Pulse: 99, Resp: 16      Comments            Junius Argyle,, MD, F.A.C.E.P.  Attending Emergency Physician           Junius Argyle, MD  02/22/18 1037

## 2018-02-22 NOTE — ED Notes (Signed)
Pt to ultrasound by stretcher      Brenda Braunebecca A Garan Frappier, RN  02/22/18 [phone]

## 2018-02-22 NOTE — ED Notes (Signed)
Pt to xray     Clydie Braunebecca A Carollyn Etcheverry, RN  02/22/18 [phone]

## 2018-02-22 NOTE — ED Notes (Signed)
Pt to restroom with steady gait, tolerating PO crackers     Clydie Braunebecca A Prisilla Kocsis, RN  02/22/18 1121

## 2018-02-22 NOTE — ED Notes (Signed)
Flu swab labeled and sent to lab.      Clydie Braunebecca A Waylon Koffler, RN  02/22/18 [phone]

## 2018-02-23 LAB — CULTURE, URINE: Culture: NO GROWTH

## 2018-02-23 LAB — EKG 12-LEAD
Atrial Rate: 101 {beats}/min
P Axis: 17 degrees
P-R Interval: 118 ms
Q-T Interval: 334 ms
QRS Duration: 72 ms
QTc Calculation (Bazett): 433 ms
R Axis: 52 degrees
T Axis: 41 degrees
Ventricular Rate: 101 {beats}/min

## 2018-03-01 ENCOUNTER — Encounter: Attending: Student in an Organized Health Care Education/Training Program

## 2018-03-10 ENCOUNTER — Other Ambulatory Visit: Payer: Self-pay

## 2018-03-10 ENCOUNTER — Emergency Department
Admission: EM | Admit: 2018-03-10 | Discharge: 2018-03-10 | Disposition: A | Discharge status: Home / Self Care | Payer: Medicaid - Out of State | Attending: Emergency Medicine | Admitting: Emergency Medicine

## 2018-03-10 ENCOUNTER — Emergency Department: Payer: Medicaid - Out of State

## 2018-03-10 DIAGNOSIS — Z3A18 18 weeks gestation of pregnancy: Secondary | ICD-10-CM | POA: Diagnosis not present

## 2018-03-10 DIAGNOSIS — R42 Dizziness and giddiness: Secondary | ICD-10-CM | POA: Insufficient documentation

## 2018-03-10 DIAGNOSIS — Z043 Encounter for examination and observation following other accident: Secondary | ICD-10-CM | POA: Insufficient documentation

## 2018-03-10 DIAGNOSIS — W19XXXA Unspecified fall, initial encounter: Secondary | ICD-10-CM

## 2018-03-10 DIAGNOSIS — O9989 Other specified diseases and conditions complicating pregnancy, childbirth and the puerperium: Secondary | ICD-10-CM | POA: Diagnosis present

## 2018-03-10 LAB — COMPREHENSIVE METABOLIC PANEL
ALT: 17 U/L (ref 0–44)
AST: 20 U/L (ref 15–41)
Albumin: 3.4 g/dL — ABNORMAL LOW (ref 3.5–5.0)
Alkaline Phosphatase: 55 U/L (ref 38–126)
Anion gap: 7 (ref 5–15)
BUN: 9 mg/dL (ref 6–20)
CHLORIDE: 105 mmol/L (ref 98–111)
CO2: 22 mmol/L (ref 22–32)
CREATININE: 0.68 mg/dL (ref 0.44–1.00)
Calcium: 8.9 mg/dL (ref 8.9–10.3)
GFR calc Af Amer: >60 mL/min (ref >60)
GFR calc non Af Amer: >60 mL/min (ref >60)
Glucose, Bld: 111 mg/dL — ABNORMAL HIGH (ref 70–99)
POTASSIUM: 3.5 mmol/L (ref 3.5–5.1)
Sodium: 134 mmol/L — ABNORMAL LOW (ref 135–145)
Total Bilirubin: 0.4 mg/dL (ref 0.3–1.2)
Total Protein: 7.6 g/dL (ref 6.5–8.1)

## 2018-03-10 LAB — CBC
HEMATOCRIT: 30.8 % — AB (ref 36.0–46.0)
HEMOGLOBIN: 9.2 g/dL — AB (ref 12.0–15.0)
MCH: 22.9 pg — AB (ref 26.0–34.0)
MCHC: 29.9 g/dL — AB (ref 30.0–36.0)
MCV: 76.6 fL — ABNORMAL LOW (ref 80.0–100.0)
Platelets: 297 10*3/uL (ref 150–400)
RBC: 4.02 MIL/uL (ref 3.87–5.11)
RDW: 18.6 % — ABNORMAL HIGH (ref 11.5–15.5)
WBC: 6.8 10*3/uL (ref 4.0–10.5)
nRBC: 0 % (ref 0.0–0.2)

## 2018-03-10 LAB — POCT PREGNANCY, URINE: PREG TEST UR: POSITIVE — AB

## 2018-03-10 LAB — HCG, QUANTITATIVE, PREGNANCY: hCG, Beta Chain, Quant, S: 18395 m[IU]/mL — ABNORMAL HIGH (ref <5)

## 2018-03-10 NOTE — ED Triage Notes (Signed)
Pt states she had a dizzy spell today and tripped and fell landing on her stomach. Pt c/o abd cramping and low back pain. Denies vaginal bleeding. Pt states she is [redacted] weeks pregnant. States she just moved back her and has not established prenatal care here yet.

## 2018-03-10 NOTE — ED Notes (Signed)
Pt reports is [redacted] weeks pregnant, had a dizzy spell and fell landing on her stomach. Pt denies LOC. Pt c/o cramping since to her belly since then. Pt does reports some light spotting since the fall as well.

## 2018-03-10 NOTE — ED Notes (Signed)
Pt recently moved here and is in the process of establishing prenatal care.

## 2018-03-10 NOTE — ED Provider Notes (Signed)
[facility] Emergency Department Provider Note   ____________________________________________    I have reviewed the triage vital signs and the nursing notes.   HISTORY  Chief Complaint Fall and pregnant patient     HPI Kristy Reid is a 22 y.o. female who reports she is approximately [redacted] weeks pregnant, she reports that she fell forward today because she briefly got lightheaded and lost her balance.  She states this happens to her sometimes.  She fell forward but was able to catch herself with her hands.  She complains of mild low back pain.  Denies abdominal pain to me.  No vaginal bleeding.  No nausea or vomiting.  This occurred earlier today prior to arrival  Past Medical History:  Diagnosis Date  . Anemia   . Anemia   . Anxiety   . Asthma   . Bipolar 1 disorder (HCC)   . Chronic back pain   . Depression   . Insomnia   . Personality disorder (HCC)    "boarderline"  . PTSD (post-traumatic stress disorder)     Patient Active Problem List   Diagnosis Date Noted  . Anxiety disorder   . Bipolar 2 disorder (HCC) 11/11/2016  . Noncompliance 11/11/2016  . Severe recurrent major depression with psychotic features (HCC) 07/18/2016  . Depression, major, recurrent, severe with psychosis (HCC) 07/17/2016  . Normal labor 05/07/2016  . Labor and delivery, indication for care 04/19/2016  . Indication for care in labor and delivery, antepartum 04/06/2016  . Decreased fetal movement 03/22/2016  . Abdominal pain in pregnancy 01/21/2016  . Constipation during pregnancy in second trimester 12/26/2015  . Overdose 11/29/2015  . Pregnancy (I trimester) 11/15/2015  . Cannabis use disorder, severe, dependence (HCC) 11/15/2015  . Tobacco use disorder 11/15/2015  . Asthma 11/15/2015  . Borderline personality disorder (HCC) 11/15/2015  . PTSD (post-traumatic stress disorder) 11/15/2015  . Severe recurrent major depression without psychotic features  (HCC) 11/13/2015    Past Surgical History:  Procedure Laterality Date  . BUNIONECTOMY Bilateral    feet  . wrist sugery      Prior to Admission medications   Medication Sig Start Date End Date Taking? Authorizing Provider  citalopram (CELEXA) 10 MG tablet Take 1 tablet (10 mg total) by mouth at bedtime. 09/24/17   [name], MD  cyclobenzaprine (FLEXERIL) 10 MG tablet Take 1 tablet (10 mg total) by mouth 3 (three) times daily as needed. 09/08/17   Joni Reining, PA-C  ferrous sulfate 325 (65 FE) MG tablet Take 1 tablet (325 mg total) by mouth daily with breakfast. 09/24/17   [name], MD  fluticasone (FLONASE) 50 MCG/ACT nasal spray Place 1 spray into both nostrils 2 (two) times daily. 09/24/17   [name], MD  hydrOXYzine (ATARAX/VISTARIL) 50 MG tablet Take 1 tablet (50 mg total) by mouth 3 (three) times daily as needed for anxiety. 09/24/17   [name], MD  loratadine (CLARITIN) 10 MG tablet Take 1 tablet (10 mg total) by mouth daily. 09/24/17   [name], MD  Melatonin 5 MG TABS Take 1 tablet (5 mg total) by mouth at bedtime. 09/24/17   [name], MD  QUEtiapine (SEROQUEL) 300 MG tablet Take 1 tablet (300 mg total) by mouth at bedtime. 09/24/17   [name], MD  traZODone (DESYREL) 150 MG tablet Take 1 tablet (150 mg total) by mouth at bedtime as needed for sleep. 09/24/17   [name], MD     Allergies Penicillins;  Rocephin [ceftriaxone]; Zithromax [azithromycin]; and Lidocaine  Family History  Problem Relation Age of Onset  . Diabetes Mother   . Diabetes Maternal Grandmother   . Heart disease Maternal Grandmother   . Cancer Neg Hx   . Ovarian cancer Neg Hx     Social History Social History   Tobacco Use  . Smoking status: Former Smoker    Types: Cigarettes    Last attempt to quit: 08/13/2015    Years since quitting: 2.5  . Smokeless tobacco: Never Used  Substance Use Topics  . Alcohol use: No  . Drug use: Yes    Frequency: 1.0 times  per week    Types: Marijuana    Comment: Last use 10/21/16    Review of Systems  Constitutional: No fever/chills Eyes: No visual changes.  ENT: No neck pain Cardiovascular: Denies chest pain. Respiratory: Denies shortness of breath. Gastrointestinal: As above Genitourinary: No vaginal bleeding Musculoskeletal: As above Skin: Negative for rash. Neurological: Negative for headaches or weakness   ____________________________________________   PHYSICAL EXAM:  VITAL SIGNS: ED Triage Vitals  Enc Vitals Group     BP 03/10/18 1447 130/68     Pulse Rate 03/10/18 1447 (!) 117     Resp 03/10/18 1447 16     Temp 03/10/18 1447 99.2 F (37.3 C)     Temp Source 03/10/18 1447 Oral     SpO2 03/10/18 1447 100 %     Weight 03/10/18 1446 70.3 kg (155 lb)     Height 03/10/18 1446 1.575 m (5\' 2" )     Head Circumference --      Peak Flow --      Pain Score 03/10/18 1453 8     Pain Loc --      Pain Edu? --      Excl. in GC? --     Constitutional: Alert and oriented. No acute distress. Eyes: Conjunctivae are normal.   Nose: No congestion/rhinnorhea. Mouth/Throat: Mucous membranes are moist.   Neck:  Painless ROM Cardiovascular: Normal rate, regular rhythm.  Good peripheral circulation. Respiratory: Normal respiratory effort.  No retractions. Lungs CTAB. Gastrointestinal: Soft and nontender. No distention.  Gravid appearance, no tenderness  Musculoskeletal: Back: No vertebral tenderness to palpation, no muscle spasms or tenderness.  Warm and well perfused extremities without pain Neurologic:  Normal speech and language. No gross focal neurologic deficits are appreciated.  Skin:  Skin is warm, dry and intact. No rash noted. Psychiatric: Mood and affect are normal. Speech and behavior are normal.  ____________________________________________   LABS (all labs ordered are listed, but only abnormal results are displayed)  Labs Reviewed  COMPREHENSIVE METABOLIC PANEL - Abnormal;  Notable for the following components:      Result Value   Sodium 134 (*)    Glucose, Bld 111 (*)    Albumin 3.4 (*)    All other components within normal limits  CBC - Abnormal; Notable for the following components:   Hemoglobin 9.2 (*)    HCT 30.8 (*)    MCV 76.6 (*)    MCH 22.9 (*)    MCHC 29.9 (*)    RDW 18.6 (*)    All other components within normal limits  HCG, QUANTITATIVE, PREGNANCY - Abnormal; Notable for the following components:   hCG, Beta Chain, Quant, S 18,395 (*)    All other components within normal limits  POCT PREGNANCY, URINE - Abnormal; Notable for the following components:   Preg Test, Ur POSITIVE (*)  All other components within normal limits  POC URINE PREG, ED   ____________________________________________  EKG   ____________________________________________  RADIOLOGY  EMB U: Normal fetal movement, fetal heart rate 158 ____________________________________________   PROCEDURES  Procedure(s) performed: No  Procedures   Critical Care performed: No ____________________________________________   INITIAL IMPRESSION / ASSESSMENT AND PLAN / ED COURSE  Pertinent labs & imaging results that were available during my care of the patient were reviewed by me and considered in my medical decision making (see chart for details).  Patient well-appearing in no acute distress.  She is reassured by bedside ultrasound.  Exam is overall quite reassuring, recommend supportive care, outpatient follow-up.    ____________________________________________   FINAL CLINICAL IMPRESSION(S) / ED DIAGNOSES  Final diagnoses:  Fall, initial encounter  Dizziness        Note:  This document was prepared using Dragon voice recognition software and may include unintentional dictation errors.    Jene EveryKinner, Jorie Zee, MD 03/10/18 [phone]

## 2018-03-10 NOTE — ED Triage Notes (Signed)
Pregnant.  Say she fell and is having some cramping and spotting.

## 2018-03-16 NOTE — L&D Delivery Note (Signed)
Mother's Information    Labor Events    Preterm labor?:  No     Mother Delivery Information    Episiotomy:  None  Lacerations:  None  Surgical or Additional Est. Blood Loss (mL):  0 (View Only):  Edit in Flowsheets   Combined Est. Blood Loss (mL):  0        Annalaya, Khouri Pending Brittnei [[id number]]    Labor Events    Preterm labor?:  No  Antenatal steroids?:  None  Cervical ripening date/time: 07/21/18 16:50:00   Cervical ripening type:  Misoprostol, Foley/EASI  Antibiotics received during labor?:  No  Rupture date/time: 07/22/18 05:02:11   Rupture type:  Artificial=AROM  Fluid color:  Clear  Induction:  Misoprostol, Oxytocin  Indications for induction:  Hypertension          Delivery Providers    Delivering clinician:       Newborn Measurements    Weight:  2565 g       Delivery Information    Episiotomy:  None  Perineal lacerations:  None    Vaginal laceration:  No    Cervical laceration:  No    Surgical or additional est. blood loss (mL):  0 (View Only):  Edit in Flowsheets   Combined est. blood loss (mL):  0          Vaginal Delivery Note  Department of Obstetrics and Gynecology  [facility]       Patient: Brenda Zamora   DOB: 06/11/1995  MRN: [id number]   Date of delivery: 07/22/18     Pre-operative Diagnosis: Brenda Zamora [id number] at [redacted]w[redacted]d, Term pregnancy and Induced labor   gHTN  Ashtma    Post-operative Diagnosis:  Same, Living newborn infant(s) and Female    Delivering Obstetrician & Assistant(s): Dr. Odis Hollingshead, Prescott Parma, DO, PGY3 ; Brenda Smiles, DO, PGY2    Infant Information:   Information for the patient's newborn:  Ayris, Austerman Pending Lynea [[id number]]        Information for the patient's newborn:  Shakima, Minich Pending Alaine [[id number]]          Apgar scores: 8 at 1 minute and 9 at 5 minutes.     Anesthesia:  epidural anesthesia    Application and Delivery:    She was known to be GBS negative.    The patient progressed well, received an epidural, became complete and felt the urge to push. After  pushing with contractions the fetal head delivered Cephalic, left occiput anterior over an intact perineum, nuchal cord was present x1 and delivered through. The anterior, then posterior shoulder delivered easily and atraumatically followed by the rest of the infant. Nose and mouth suctioned with bulb suction, infant was stimulated and dried. Cord was clamped and cut after one minute delayed cord clamping and infant attended by RN for evaluation. The delivery of the placenta was spontaneous and appeared intact, whole and that the umbilical cord had three vessels noted. Pitocin was started. The vagina was swept of all clots and debris. The perineum and vagina were evaluated and no lacerations were found. Mother and baby tolerated procedure well.     Dr. Jimmey Ralph was present for entire delivery.    Delivery Summary:       Specimen: placenta sent to pathology, cord blood and cord gases  Estimated blood loss:   Condition:  infant stable to general nursery and mother stable  Counts: instrument and sponge counts correct  Blood Type  and Rh: O POSITIVE    Brenda SmilesAndrew Jelitza Manninen, DO  Ob/Gyn Resident  07/22/2018, 8:26 AM

## 2018-03-22 ENCOUNTER — Emergency Department
Admission: EM | Admit: 2018-03-22 | Discharge: 2018-03-22 | Disposition: A | Discharge status: Home / Self Care | Payer: Medicaid Other | Attending: Emergency Medicine | Admitting: Emergency Medicine

## 2018-03-22 ENCOUNTER — Emergency Department: Payer: Medicaid Other

## 2018-03-22 DIAGNOSIS — F1220 Cannabis dependence, uncomplicated: Secondary | ICD-10-CM | POA: Diagnosis not present

## 2018-03-22 DIAGNOSIS — F419 Anxiety disorder, unspecified: Secondary | ICD-10-CM | POA: Diagnosis not present

## 2018-03-22 DIAGNOSIS — Z79899 Other long term (current) drug therapy: Secondary | ICD-10-CM | POA: Insufficient documentation

## 2018-03-22 DIAGNOSIS — F319 Bipolar disorder, unspecified: Secondary | ICD-10-CM | POA: Diagnosis not present

## 2018-03-22 DIAGNOSIS — O99322 Drug use complicating pregnancy, second trimester: Secondary | ICD-10-CM | POA: Insufficient documentation

## 2018-03-22 DIAGNOSIS — Z3A20 20 weeks gestation of pregnancy: Secondary | ICD-10-CM | POA: Insufficient documentation

## 2018-03-22 DIAGNOSIS — R102 Other specified pregnancy related conditions, second trimester: Secondary | ICD-10-CM

## 2018-03-22 DIAGNOSIS — O99012 Anemia complicating pregnancy, second trimester: Secondary | ICD-10-CM | POA: Insufficient documentation

## 2018-03-22 DIAGNOSIS — O9989 Other specified diseases and conditions complicating pregnancy, childbirth and the puerperium: Secondary | ICD-10-CM | POA: Diagnosis present

## 2018-03-22 DIAGNOSIS — D509 Iron deficiency anemia, unspecified: Secondary | ICD-10-CM | POA: Diagnosis not present

## 2018-03-22 DIAGNOSIS — O26892 Other specified pregnancy related conditions, second trimester: Secondary | ICD-10-CM

## 2018-03-22 DIAGNOSIS — R079 Chest pain, unspecified: Secondary | ICD-10-CM | POA: Diagnosis not present

## 2018-03-22 DIAGNOSIS — Z87891 Personal history of nicotine dependence: Secondary | ICD-10-CM | POA: Diagnosis not present

## 2018-03-22 DIAGNOSIS — O99342 Other mental disorders complicating pregnancy, second trimester: Secondary | ICD-10-CM | POA: Diagnosis not present

## 2018-03-22 LAB — BASIC METABOLIC PANEL
Anion gap: 7 (ref 5–15)
BUN: 9 mg/dL (ref 6–20)
CHLORIDE: 104 mmol/L (ref 98–111)
CO2: 22 mmol/L (ref 22–32)
CREATININE: 0.69 mg/dL (ref 0.44–1.00)
Calcium: 9.1 mg/dL (ref 8.9–10.3)
GFR calc Af Amer: >60 mL/min (ref >60)
GLUCOSE: 90 mg/dL (ref 70–99)
POTASSIUM: 4 mmol/L (ref 3.5–5.1)
Sodium: 133 mmol/L — ABNORMAL LOW (ref 135–145)

## 2018-03-22 LAB — CBC
HCT: 29.5 % — ABNORMAL LOW (ref 36.0–46.0)
Hemoglobin: 8.9 g/dL — ABNORMAL LOW (ref 12.0–15.0)
MCH: 22.9 pg — ABNORMAL LOW (ref 26.0–34.0)
MCHC: 30.2 g/dL (ref 30.0–36.0)
MCV: 75.8 fL — ABNORMAL LOW (ref 80.0–100.0)
Platelets: 279 10*3/uL (ref 150–400)
RBC: 3.89 MIL/uL (ref 3.87–5.11)
RDW: 17.9 % — ABNORMAL HIGH (ref 11.5–15.5)
WBC: 7.3 10*3/uL (ref 4.0–10.5)
nRBC: 0 % (ref 0.0–0.2)

## 2018-03-22 LAB — INFLUENZA PANEL BY PCR (TYPE A & B)
Influenza A By PCR: NEGATIVE
Influenza B By PCR: NEGATIVE

## 2018-03-22 LAB — TROPONIN I: Troponin I: <0.03 ng/mL

## 2018-03-22 LAB — HCG, QUANTITATIVE, PREGNANCY: hCG, Beta Chain, Quant, S: 21585 m[IU]/mL — ABNORMAL HIGH (ref <5)

## 2018-03-22 MED ORDER — ACETAMINOPHEN 325 MG PO TABS
650.0000 mg | ORAL_TABLET | Freq: Once | ORAL | Status: AC
  Administered 2018-03-22: 650 mg via ORAL
  Filled 2018-03-22 (×2): qty 2

## 2018-03-22 NOTE — ED Provider Notes (Signed)
[facility] Emergency Department Provider Note ___   First MD Initiated Contact with Patient 03/22/18 [phone]     (approximate)  I have reviewed the triage vital signs and the nursing notes.   HISTORY  Chief Complaint Abdominal Cramping and Chest Pain    HPI Kristy Reid is a 23 y.o. female [redacted] weeks pregnant with below list of chronic medical conditions presents to the emergency department with abdominal cramping and central chest discomfort that the patient states is worse with movement and breathing.  Patient denies any cough.  Patient denies any diaphoresis.  Patient states that the pain is nonradiating.  Patient states that pain is unrelieved with Tylenol at home.  Patient denies any lower extremity pain or swelling.  Patient denies any history of DVT or PE.   Past Medical History:  Diagnosis Date  . Anemia   . Anemia   . Anxiety   . Asthma   . Bipolar 1 disorder (HCC)   . Chronic back pain   . Depression   . Insomnia   . Personality disorder (HCC)    "boarderline"  . PTSD (post-traumatic stress disorder)     Patient Active Problem List   Diagnosis Date Noted  . Anxiety disorder   . Bipolar 2 disorder (HCC) 11/11/2016  . Noncompliance 11/11/2016  . Severe recurrent major depression with psychotic features (HCC) 07/18/2016  . Depression, major, recurrent, severe with psychosis (HCC) 07/17/2016  . Normal labor 05/07/2016  . Labor and delivery, indication for care 04/19/2016  . Indication for care in labor and delivery, antepartum 04/06/2016  . Decreased fetal movement 03/22/2016  . Abdominal pain in pregnancy 01/21/2016  . Constipation during pregnancy in second trimester 12/26/2015  . Overdose 11/29/2015  . Pregnancy (I trimester) 11/15/2015  . Cannabis use disorder, severe, dependence (HCC) 11/15/2015  . Tobacco use disorder 11/15/2015  . Asthma 11/15/2015  . Borderline personality disorder (HCC) 11/15/2015  . PTSD (post-traumatic  stress disorder) 11/15/2015  . Severe recurrent major depression without psychotic features (HCC) 11/13/2015    Past Surgical History:  Procedure Laterality Date  . BUNIONECTOMY Bilateral    feet  . wrist sugery      Prior to Admission medications   Medication Sig Start Date End Date Taking? Authorizing Provider  citalopram (CELEXA) 10 MG tablet Take 1 tablet (10 mg total) by mouth at bedtime. 09/24/17   [name], MD  cyclobenzaprine (FLEXERIL) 10 MG tablet Take 1 tablet (10 mg total) by mouth 3 (three) times daily as needed. 09/08/17   Joni Reining, PA-C  ferrous sulfate 325 (65 FE) MG tablet Take 1 tablet (325 mg total) by mouth daily with breakfast. 09/24/17   [name], MD  fluticasone (FLONASE) 50 MCG/ACT nasal spray Place 1 spray into both nostrils 2 (two) times daily. 09/24/17   [name], MD  hydrOXYzine (ATARAX/VISTARIL) 50 MG tablet Take 1 tablet (50 mg total) by mouth 3 (three) times daily as needed for anxiety. 09/24/17   [name], MD  loratadine (CLARITIN) 10 MG tablet Take 1 tablet (10 mg total) by mouth daily. 09/24/17   [name], MD  Melatonin 5 MG TABS Take 1 tablet (5 mg total) by mouth at bedtime. 09/24/17   [name], MD  QUEtiapine (SEROQUEL) 300 MG tablet Take 1 tablet (300 mg total) by mouth at bedtime. 09/24/17   [name], MD  traZODone (DESYREL) 150 MG tablet Take 1 tablet (150 mg total) by mouth at bedtime as needed  for sleep. 09/24/17   [name], MD    Allergies Penicillins; Rocephin [ceftriaxone]; Zithromax [azithromycin]; and Lidocaine  Family History  Problem Relation Age of Onset  . Diabetes Mother   . Diabetes Maternal Grandmother   . Heart disease Maternal Grandmother   . Cancer Neg Hx   . Ovarian cancer Neg Hx     Social History Social History   Tobacco Use  . Smoking status: Former Smoker    Types: Cigarettes    Last attempt to quit: 08/13/2015    Years since quitting: 2.6  . Smokeless tobacco: Never  Used  Substance Use Topics  . Alcohol use: No  . Drug use: Yes    Frequency: 1.0 times per week    Types: Marijuana    Comment: Last use 10/21/16    Review of Systems Constitutional: No fever/chills Eyes: No visual changes. ENT: No sore throat. Cardiovascular: Positive for chest pain. Respiratory: Denies shortness of breath. Gastrointestinal: No abdominal pain.  No nausea, no vomiting.  No diarrhea.  No constipation. Genitourinary: Negative for dysuria. Musculoskeletal: Negative for neck pain.  Negative for back pain. Integumentary: Negative for rash. Neurological: Negative for headaches, focal weakness or numbness.   ____________________________________________   PHYSICAL EXAM:  VITAL SIGNS: ED Triage Vitals  Enc Vitals Group     BP 03/22/18 0851 121/77     Pulse Rate 03/22/18 0851 (!) 123     Resp 03/22/18 0851 19     Temp 03/22/18 0851 98.3 F (36.8 C)     Temp Source 03/22/18 0851 Oral     SpO2 03/22/18 0855 100 %     Weight 03/22/18 0926 72.6 kg (160 lb)     Height 03/22/18 0926 1.575 m (5\' 2" )     Head Circumference --      Peak Flow --      Pain Score 03/22/18 0926 8     Pain Loc --      Pain Edu? --      Excl. in GC? --     Constitutional: Alert and oriented. Well appearing and in no acute distress. Eyes: Conjunctivae are normal. Mouth/Throat: Mucous membranes are moist.  Oropharynx non-erythematous. Neck: No stridor. Chest: Pain with very gentle parasternal patient. Cardiovascular: Normal rate, regular rhythm. Good peripheral circulation. Grossly normal heart sounds. Respiratory: Normal respiratory effort.  No retractions. Lungs CTAB. Gastrointestinal: Soft and nontender. No distention.  Left pelvic discomfort with palpation. Musculoskeletal: No lower extremity tenderness nor edema. No gross deformities of extremities. Neurologic:  Normal speech and language. No gross focal neurologic deficits are appreciated.  Skin:  Skin is warm, dry and intact. No  rash noted. Psychiatric: Mood and affect are normal. Speech and behavior are normal.  ____________________________________________   LABS (all labs ordered are listed, but only abnormal results are displayed)  Labs Reviewed  BASIC METABOLIC PANEL - Abnormal; Notable for the following components:      Result Value   Sodium 133 (*)    All other components within normal limits  CBC - Abnormal; Notable for the following components:   Hemoglobin 8.9 (*)    HCT 29.5 (*)    MCV 75.8 (*)    MCH 22.9 (*)    RDW 17.9 (*)    All other components within normal limits  HCG, QUANTITATIVE, PREGNANCY - Abnormal; Notable for the following components:   hCG, Beta Chain, Quant, S 21,585 (*)    All other components within normal limits  TROPONIN I  INFLUENZA PANEL  BY PCR (TYPE A & B)   ____________________________________________  EKG  ED ECG REPORT I, [place] N Evagelia Knack, the attending physician, personally viewed and interpreted this ECG.   Date: 03/22/2018  EKG Time: 9:22 AM  Rate: 100  Rhythm: Normal sinus rhythm  Axis: Normal  Intervals: Normal  ST&T Change: None  ____________________________________________  RADIOLOGY I, [place] N Theodora Lalanne, personally viewed and evaluated these images (plain radiographs) as part of my medical decision making, as well as reviewing the written report by the radiologist.  ED MD interpretation: Single live intrauterine pregnancy 9 weeks 5 days per radiologist on ultrasound.  Official radiology report(s): [facility] Limited  Result Date: 03/22/2018 CLINICAL DATA:  Pelvic discomfort.  Pregnancy. EXAM: LIMITED OBSTETRIC ULTRASOUND FINDINGS: Number of Fetuses: 1 Heart Rate:  153 bpm Movement: Present Presentation: Breech Placental Location: Anterior Previa: No Amniotic Fluid (Subjective): Within normal limits. Vertical pocket 5.2 cm BPD: 4.5 cm 19 w  5 d MATERNAL FINDINGS: Cervix:  Appears closed. Uterus/Adnexae: No abnormality visualized. IMPRESSION: Single viable  injury pregnancy at 19 weeks 5 days This exam is performed on an emergent basis and does not comprehensively evaluate fetal size, dating, or anatomy; follow-up complete OB US should be considered if further fetal assessment is warranted. Electronically Signed   By: Maisie Fushomas  Register   On: 03/22/2018 11:03      Procedures   ____________________________________________   INITIAL IMPRESSION / ASSESSMENT AND PLAN / ED COURSE  As part of my medical decision making, I reviewed the following data within the electronic MEDICAL RECORD NUMBER   23 year old female presented with above-stated history and physical exam secondary to reproducible central chest pain and pelvic discomfort.  Consider the possibility of CAD however EKG and troponin negative.  Also considered possibility of pulmonary emboli however patient with reproducible chest pain no lower extremity pain or swelling no previous history of DVT or PE and as such I suspect this to be unlikely.  Regarding the patient's pelvic discomfort ultrasound performed which revealed a single live intrauterine pregnancy.  Patient discussed with Dr. Rana[facility] who stated patient should follow-up with them and on Monday. ____________________________________________  FINAL CLINICAL IMPRESSION(S) / ED DIAGNOSES  Final diagnoses:  Pelvic pain in antepartum period in second trimester  Iron deficiency anemia, unspecified iron deficiency anemia type     MEDICATIONS GIVEN DURING THIS VISIT:  Medications  acetaminophen (TYLENOL) tablet 650 mg (650 mg Oral Given 03/22/18 1011)     ED Discharge Orders    None       Note:  This document was prepared using Dragon voice recognition software and may include unintentional dictation errors.    Darci CurrentBrown, [place] N, MD 03/22/18 [phone]

## 2018-03-22 NOTE — OB Triage Note (Signed)
Patient recently moved back to the area, has not established care yet with an OB practice.  States her last prenatal visit was in November.   Kristy Reid presents today with complaints of chest pain (8/10 pain level), decreased fetal movement and cramping.  Denies leaking of fluid or vaginal bleeding.  FHT of 152 obtained, calling ED to have patient evaluated for chest pain.

## 2018-03-22 NOTE — ED Notes (Signed)
Pt is here for CP.  Pt reports 30lb weight loss with this pregnancy.  She reports anemia and last check up for pregnancy was in November, she reports that she was told that she was anemic in November, she feels that her hgb was 9 at the time.  Pt is alert and oriented.  She also states that she has pain in both legs. CP with palpation.

## 2018-03-22 NOTE — ED Triage Notes (Signed)
Pt reports is [redacted] weeks pregnant and having and cramping and some chest pain. Pt reports hx of a miscarriage and just moved here so has not received prenatal care since November.

## 2018-03-22 NOTE — ED Notes (Signed)
Pt states that she can feel her baby move, no contractions or fluid from vagina.

## 2018-03-22 NOTE — ED Notes (Signed)
Patient transported to Ultrasound 

## 2018-03-22 NOTE — ED Notes (Signed)
Pt was seen upstairs and FHR was checked

## 2018-03-25 ENCOUNTER — Ambulatory Visit: Payer: Self-pay | Admitting: Obstetrics & Gynecology

## 2018-04-04 ENCOUNTER — Emergency Department (HOSPITAL_COMMUNITY)
Admission: EM | Admit: 2018-04-04 | Discharge: 2018-04-04 | Disposition: A | Discharge status: Home / Self Care | Payer: Medicaid Other | Attending: Emergency Medicine | Admitting: Emergency Medicine

## 2018-04-04 ENCOUNTER — Emergency Department (HOSPITAL_COMMUNITY): Payer: Medicaid Other

## 2018-04-04 DIAGNOSIS — O219 Vomiting of pregnancy, unspecified: Secondary | ICD-10-CM | POA: Diagnosis not present

## 2018-04-04 DIAGNOSIS — F1220 Cannabis dependence, uncomplicated: Secondary | ICD-10-CM | POA: Insufficient documentation

## 2018-04-04 DIAGNOSIS — J45901 Unspecified asthma with (acute) exacerbation: Secondary | ICD-10-CM

## 2018-04-04 DIAGNOSIS — F319 Bipolar disorder, unspecified: Secondary | ICD-10-CM | POA: Diagnosis not present

## 2018-04-04 DIAGNOSIS — O99340 Other mental disorders complicating pregnancy, unspecified trimester: Secondary | ICD-10-CM | POA: Insufficient documentation

## 2018-04-04 DIAGNOSIS — O99519 Diseases of the respiratory system complicating pregnancy, unspecified trimester: Secondary | ICD-10-CM | POA: Insufficient documentation

## 2018-04-04 DIAGNOSIS — Z3A00 Weeks of gestation of pregnancy not specified: Secondary | ICD-10-CM | POA: Insufficient documentation

## 2018-04-04 DIAGNOSIS — Z87891 Personal history of nicotine dependence: Secondary | ICD-10-CM | POA: Diagnosis not present

## 2018-04-04 DIAGNOSIS — O210 Mild hyperemesis gravidarum: Secondary | ICD-10-CM

## 2018-04-04 DIAGNOSIS — F419 Anxiety disorder, unspecified: Secondary | ICD-10-CM | POA: Insufficient documentation

## 2018-04-04 DIAGNOSIS — O99320 Drug use complicating pregnancy, unspecified trimester: Secondary | ICD-10-CM | POA: Diagnosis not present

## 2018-04-04 DIAGNOSIS — Z3490 Encounter for supervision of normal pregnancy, unspecified, unspecified trimester: Secondary | ICD-10-CM

## 2018-04-04 LAB — CBC
HCT: 29.9 % — ABNORMAL LOW (ref 36.0–46.0)
HEMOGLOBIN: 8.6 g/dL — AB (ref 12.0–15.0)
MCH: 22.9 pg — ABNORMAL LOW (ref 26.0–34.0)
MCHC: 28.8 g/dL — ABNORMAL LOW (ref 30.0–36.0)
MCV: 79.5 fL — ABNORMAL LOW (ref 80.0–100.0)
Platelets: 257 10*3/uL (ref 150–400)
RBC: 3.76 MIL/uL — ABNORMAL LOW (ref 3.87–5.11)
RDW: 17.6 % — ABNORMAL HIGH (ref 11.5–15.5)
WBC: 9.3 10*3/uL (ref 4.0–10.5)
nRBC: 0 % (ref 0.0–0.2)

## 2018-04-04 LAB — BASIC METABOLIC PANEL
ANION GAP: 11 (ref 5–15)
BUN: 9 mg/dL (ref 6–20)
CO2: 18 mmol/L — ABNORMAL LOW (ref 22–32)
Calcium: 8.6 mg/dL — ABNORMAL LOW (ref 8.9–10.3)
Chloride: 108 mmol/L (ref 98–111)
Creatinine, Ser: 0.65 mg/dL (ref 0.44–1.00)
GFR calc non Af Amer: >60 mL/min (ref >60)
Glucose, Bld: 103 mg/dL — ABNORMAL HIGH (ref 70–99)
POTASSIUM: 3.1 mmol/L — AB (ref 3.5–5.1)
Sodium: 137 mmol/L (ref 135–145)

## 2018-04-04 MED ORDER — ONDANSETRON 4 MG PO TBDP
4.0000 mg | ORAL_TABLET | Freq: Once | ORAL | Status: AC
  Administered 2018-04-04: 4 mg via ORAL
  Filled 2018-04-04: qty 1

## 2018-04-04 MED ORDER — ACETAMINOPHEN 325 MG PO TABS
650.0000 mg | ORAL_TABLET | Freq: Once | ORAL | Status: AC
  Administered 2018-04-04: 650 mg via ORAL
  Filled 2018-04-04: qty 2

## 2018-04-04 MED ORDER — PREDNISONE 50 MG PO TABS: 50.0000 mg | ORAL_TABLET | Freq: Every day | ORAL | 0 refills | Status: DC

## 2018-04-04 MED ORDER — PREDNISONE 20 MG PO TABS
60.0000 mg | ORAL_TABLET | Freq: Once | ORAL | Status: AC
  Administered 2018-04-04: 60 mg via ORAL
  Filled 2018-04-04: qty 3

## 2018-04-04 MED ORDER — ALBUTEROL SULFATE HFA 108 (90 BASE) MCG/ACT IN AERS
1.0000 | INHALATION_SPRAY | Freq: Once | RESPIRATORY_TRACT | Status: AC
  Administered 2018-04-04: 1 via RESPIRATORY_TRACT
  Filled 2018-04-04: qty 6.7

## 2018-04-04 MED ORDER — PROMETHAZINE HCL 25 MG PO TABS: 25.0000 mg | ORAL_TABLET | Freq: Four times a day (QID) | ORAL | 0 refills | Status: DC | PRN

## 2018-04-04 NOTE — Discharge Instructions (Signed)
Take the medication as needed for your asthma.  Next-door to take the steroids until they are gone.  You can take Tylenol as needed for aches and pains.  Phenergan is to help with morning sickness.  Follow-up with an OB/GYN doctor soon as possible.

## 2018-04-04 NOTE — ED Notes (Signed)
Patient transported to X-ray 

## 2018-04-04 NOTE — ED Notes (Signed)
Bed: JG28 Expected date:  Expected time:  Means of arrival:  Comments: 23 yo f asthma, pregnant, sob

## 2018-04-04 NOTE — ED Provider Notes (Signed)
[facility]-EMERGENCY DEPT Provider Note   CSN: [id number] Arrival date & time: 04/04/18  1731     History   Chief Complaint Chief Complaint  Patient presents with  . Asthma    HPI Kristy Reid is a 23 y.o. female.  HPI Pt presents to the ED for shortness of breath.  Pt has history of asthma.  She has been wheezing but does not have an inhaler.  Pt also has history of anemia and one time when she felt short of breath she needed a blood transfusion.  She felt worse today and had to call the ambulance.  EMS gave her breathing treatments and she is feeling better.  She denies any leg swelling.   No chest pain.  She has had nausea and vomiting with her morning sickness.  Pt has not had any prenatal care for a few months because she moved to this area.  She is currently not on any prenatal vitamins.   No vaginal bleeding.  No fever.  No history of DVT Past Medical History:  Diagnosis Date  . Anemia   . Anemia   . Anxiety   . Asthma   . Bipolar 1 disorder (HCC)   . Chronic back pain   . Depression   . Insomnia   . Personality disorder (HCC)    "boarderline"  . PTSD (post-traumatic stress disorder)     Patient Active Problem List   Diagnosis Date Noted  . Anxiety disorder   . Bipolar 2 disorder (HCC) 11/11/2016  . Noncompliance 11/11/2016  . Severe recurrent major depression with psychotic features (HCC) 07/18/2016  . Depression, major, recurrent, severe with psychosis (HCC) 07/17/2016  . Normal labor 05/07/2016  . Labor and delivery, indication for care 04/19/2016  . Indication for care in labor and delivery, antepartum 04/06/2016  . Decreased fetal movement 03/22/2016  . Abdominal pain in pregnancy 01/21/2016  . Constipation during pregnancy in second trimester 12/26/2015  . Overdose 11/29/2015  . Pregnancy (I trimester) 11/15/2015  . Cannabis use disorder, severe, dependence (HCC) 11/15/2015  . Tobacco use disorder 11/15/2015  . Asthma  11/15/2015  . Borderline personality disorder (HCC) 11/15/2015  . PTSD (post-traumatic stress disorder) 11/15/2015  . Severe recurrent major depression without psychotic features (HCC) 11/13/2015    Past Surgical History:  Procedure Laterality Date  . BUNIONECTOMY Bilateral    feet  . wrist sugery       OB History    Gravida  4   Para  2   Term  2   Preterm  0   AB  1   Living  2     SAB  1   TAB  0   Ectopic  0   Multiple  0   Live Births  2            Home Medications    Prior to Admission medications   Medication Sig Start Date End Date Taking? Authorizing Provider  citalopram (CELEXA) 10 MG tablet Take 1 tablet (10 mg total) by mouth at bedtime. Patient not taking: Reported on 04/04/2018 09/24/17   [name], MD  cyclobenzaprine (FLEXERIL) 10 MG tablet Take 1 tablet (10 mg total) by mouth 3 (three) times daily as needed. Patient not taking: Reported on 04/04/2018 09/08/17   Joni Reining, PA-C  ferrous sulfate 325 (65 FE) MG tablet Take 1 tablet (325 mg total) by mouth daily with breakfast. Patient not taking: Reported on 04/04/2018 09/24/17  [name]'Neal, Sarita, MD  fluticasone (FLONASE) 50 MCG/ACT nasal spray Place 1 spray into both nostrils 2 (two) times daily. Patient not taking: Reported on 04/04/2018 09/24/17   [name]'Neal, Sarita, MD  hydrOXYzine (ATARAX/VISTARIL) 50 MG tablet Take 1 tablet (50 mg total) by mouth 3 (three) times daily as needed for anxiety. Patient not taking: Reported on 04/04/2018 09/24/17   [name]'Neal, Sarita, MD  loratadine (CLARITIN) 10 MG tablet Take 1 tablet (10 mg total) by mouth daily. Patient not taking: Reported on 04/04/2018 09/24/17   [name]'Neal, Sarita, MD  Melatonin 5 MG TABS Take 1 tablet (5 mg total) by mouth at bedtime. Patient not taking: Reported on 04/04/2018 09/24/17   [name]'Neal, Sarita, MD  predniSONE (DELTASONE) 50 MG tablet Take 1 tablet (50 mg total) by mouth daily. 04/04/18   Linwood DibblesKnapp, Milany Geck, MD  predniSONE (DELTASONE) 50 MG tablet  Take 1 tablet (50 mg total) by mouth daily. 04/04/18   Linwood DibblesKnapp, Aleynah Rocchio, MD  promethazine (PHENERGAN) 25 MG tablet Take 1 tablet (25 mg total) by mouth every 6 (six) hours as needed for nausea or vomiting. 04/04/18   Linwood DibblesKnapp, Wrenly Lauritsen, MD  promethazine (PHENERGAN) 25 MG tablet Take 1 tablet (25 mg total) by mouth every 6 (six) hours as needed for nausea or vomiting. 04/04/18   Linwood DibblesKnapp, Chelsae Zanella, MD  QUEtiapine (SEROQUEL) 300 MG tablet Take 1 tablet (300 mg total) by mouth at bedtime. Patient not taking: Reported on 04/04/2018 09/24/17   [name]'Neal, Sarita, MD  traZODone (DESYREL) 150 MG tablet Take 1 tablet (150 mg total) by mouth at bedtime as needed for sleep. Patient not taking: Reported on 04/04/2018 09/24/17   [name]'Neal, Sarita, MD    Family History Family History  Problem Relation Age of Onset  . Diabetes Mother   . Diabetes Maternal Grandmother   . Heart disease Maternal Grandmother   . Cancer Neg Hx   . Ovarian cancer Neg Hx     Social History Social History   Tobacco Use  . Smoking status: Former Smoker    Types: Cigarettes    Last attempt to quit: 08/13/2015    Years since quitting: 2.6  . Smokeless tobacco: Never Used  Substance Use Topics  . Alcohol use: No  . Drug use: Yes    Frequency: 1.0 times per week    Types: Marijuana    Comment: Last use 10/21/16     Allergies   Penicillins; Rocephin [ceftriaxone]; Zithromax [azithromycin]; and Lidocaine   Review of Systems Review of Systems  All other systems reviewed and are negative.    Physical Exam Updated Vital Signs BP 118/74 (BP Location: Left Arm)   Pulse (!) 104   Temp 98.2 F (36.8 C)   Resp 17   LMP  (LMP Unknown)   SpO2 100%   Physical Exam Vitals signs and nursing note reviewed.  Constitutional:      General: She is not in acute distress.    Appearance: She is well-developed.  HENT:     Head: Normocephalic and atraumatic.     Right Ear: External ear normal.     Left Ear: External ear normal.  Eyes:     General: No  scleral icterus.       Right eye: No discharge.        Left eye: No discharge.     Conjunctiva/sclera: Conjunctivae normal.  Neck:     Musculoskeletal: Neck supple.     Trachea: No tracheal deviation.  Cardiovascular:     Rate and Rhythm: Normal rate  and regular rhythm.  Pulmonary:     Effort: Pulmonary effort is normal. No respiratory distress.     Breath sounds: Normal breath sounds. No stridor. No wheezing or rales.  Abdominal:     General: Bowel sounds are normal. There is no distension.     Palpations: Abdomen is soft.     Tenderness: There is no abdominal tenderness. There is no guarding or rebound.  Musculoskeletal:        General: No tenderness.  Skin:    General: Skin is warm and dry.     Findings: No rash.  Neurological:     Mental Status: She is alert.     Cranial Nerves: No cranial nerve deficit (no facial droop, extraocular movements intact, no slurred speech).     Sensory: No sensory deficit.     Motor: No abnormal muscle tone or seizure activity.     Coordination: Coordination normal.      ED Treatments / Results  Labs (all labs ordered are listed, but only abnormal results are displayed) Labs Reviewed  BASIC METABOLIC PANEL - Abnormal; Notable for the following components:      Result Value   Potassium 3.1 (*)    CO2 18 (*)    Glucose, Bld 103 (*)    Calcium 8.6 (*)    All other components within normal limits  CBC - Abnormal; Notable for the following components:   RBC 3.76 (*)    Hemoglobin 8.6 (*)    HCT 29.9 (*)    MCV 79.5 (*)    MCH 22.9 (*)    MCHC 28.8 (*)    RDW 17.6 (*)    All other components within normal limits    EKG None  Radiology Dg Chest 2 View  Result Date: 04/04/2018 CLINICAL DATA:  Shortness of Breath while smoking marijuana EXAM: CHEST - 2 VIEW COMPARISON:  08/20/2017 FINDINGS: Cardiac shadows within normal limits. The lungs are well aerated bilaterally without focal infiltrate or sizable effusion. No acute bony  abnormality is noted. IMPRESSION: No active cardiopulmonary disease. Electronically Signed   By: Alcide Clever M.D.   On: 04/04/2018 18:48    Procedures Procedures (including critical care time)  Medications Ordered in ED Medications  albuterol (PROVENTIL HFA;VENTOLIN HFA) 108 (90 Base) MCG/ACT inhaler 1-2 puff (has no administration in time range)  predniSONE (DELTASONE) tablet 60 mg (60 mg Oral Given 04/04/18 1846)  acetaminophen (TYLENOL) tablet 650 mg (650 mg Oral Given 04/04/18 1846)  ondansetron (ZOFRAN-ODT) disintegrating tablet 4 mg (4 mg Oral Given 04/04/18 1847)     Initial Impression / Assessment and Plan / ED Course  I have reviewed the triage vital signs and the nursing notes.  Pertinent labs & imaging results that were available during my care of the patient were reviewed by me and considered in my medical decision making (see chart for details).  Clinical Course as of Apr 04 2009  Mon Apr 04, 2018  1835 HR 100 at bedside   [JK]  1946 Labs show mild metabolic acidosis.  No anion gap.  May be related to her nausea and vomiting.  Patient has not vomited here in the ED.  Patient has a stable anemia.   [JK]  1947 Chest x-ray does not show any pneumonia.   [JK]    Clinical Course User Index [JK] Linwood Dibbles, MD    Patient presented to the emergency room for evaluation of shortness of breath.  Patient has a history of asthma.  She  was given breathing treatments before my exam.  Patient's improved with her breathing treatments.  She does have a mild tachycardia but I doubt pulmonary embolism.  She was wheezing earlier in improved with beta agonist.  I will give the patient a prescription for prednisone.  I will also give her an albuterol inhaler.  Patient has had some nausea and morning sickness.  I will give her prescription for Phenergan which she was taking earlier.  I encourage close OB/GYN follow-up. Final Clinical Impressions(s) / ED Diagnoses   Final diagnoses:  Moderate  asthma with exacerbation, unspecified whether persistent  Pregnancy, unspecified gestational age  Morning sickness    ED Discharge Orders         Ordered    predniSONE (DELTASONE) 50 MG tablet  Daily     04/04/18 2000    promethazine (PHENERGAN) 25 MG tablet  Every 6 hours PRN     04/04/18 2000    promethazine (PHENERGAN) 25 MG tablet  Every 6 hours PRN     04/04/18 2006    predniSONE (DELTASONE) 50 MG tablet  Daily     04/04/18 2006           Yu Cragun, MD 04/04/18 2011

## 2018-04-04 NOTE — ED Triage Notes (Signed)
Pt arrives via EMS from home. Pt reports shortness of breath and cough for 2 days. Pt reports she is 5 months pregnant and has had no prenatal care. EMS reports beginning neb treatment for wheezing.

## 2018-04-07 ENCOUNTER — Encounter: Primary: Family Medicine

## 2018-04-22 ENCOUNTER — Emergency Department (HOSPITAL_COMMUNITY)
Admission: EM | Admit: 2018-04-22 | Discharge: 2018-04-22 | Disposition: A | Discharge status: Home / Self Care | Payer: Medicaid Other | Attending: Emergency Medicine | Admitting: Emergency Medicine

## 2018-04-22 ENCOUNTER — Other Ambulatory Visit: Payer: Self-pay

## 2018-04-22 ENCOUNTER — Encounter (HOSPITAL_COMMUNITY): Payer: Self-pay | Admitting: Emergency Medicine

## 2018-04-22 DIAGNOSIS — K0889 Other specified disorders of teeth and supporting structures: Secondary | ICD-10-CM

## 2018-04-22 DIAGNOSIS — J45909 Unspecified asthma, uncomplicated: Secondary | ICD-10-CM | POA: Insufficient documentation

## 2018-04-22 DIAGNOSIS — Z3A24 24 weeks gestation of pregnancy: Secondary | ICD-10-CM | POA: Insufficient documentation

## 2018-04-22 DIAGNOSIS — R6884 Jaw pain: Secondary | ICD-10-CM | POA: Insufficient documentation

## 2018-04-22 DIAGNOSIS — O26892 Other specified pregnancy related conditions, second trimester: Secondary | ICD-10-CM | POA: Diagnosis not present

## 2018-04-22 DIAGNOSIS — O219 Vomiting of pregnancy, unspecified: Secondary | ICD-10-CM | POA: Insufficient documentation

## 2018-04-22 DIAGNOSIS — Z87891 Personal history of nicotine dependence: Secondary | ICD-10-CM | POA: Insufficient documentation

## 2018-04-22 DIAGNOSIS — O99012 Anemia complicating pregnancy, second trimester: Secondary | ICD-10-CM | POA: Insufficient documentation

## 2018-04-22 DIAGNOSIS — F1210 Cannabis abuse, uncomplicated: Secondary | ICD-10-CM | POA: Diagnosis not present

## 2018-04-22 DIAGNOSIS — R39198 Other difficulties with micturition: Secondary | ICD-10-CM | POA: Insufficient documentation

## 2018-04-22 DIAGNOSIS — D649 Anemia, unspecified: Secondary | ICD-10-CM

## 2018-04-22 LAB — CBC
HCT: 29.9 % — ABNORMAL LOW (ref 36.0–46.0)
Hemoglobin: 8.6 g/dL — ABNORMAL LOW (ref 12.0–15.0)
MCH: 22 pg — ABNORMAL LOW (ref 26.0–34.0)
MCHC: 28.8 g/dL — ABNORMAL LOW (ref 30.0–36.0)
MCV: 76.5 fL — ABNORMAL LOW (ref 80.0–100.0)
Platelets: 257 10*3/uL (ref 150–400)
RBC: 3.91 MIL/uL (ref 3.87–5.11)
RDW: 17.4 % — AB (ref 11.5–15.5)
WBC: 8.2 10*3/uL (ref 4.0–10.5)
nRBC: 0.2 % (ref 0.0–0.2)

## 2018-04-22 LAB — URINALYSIS, ROUTINE W REFLEX MICROSCOPIC
BILIRUBIN URINE: NEGATIVE
Glucose, UA: NEGATIVE mg/dL
Hgb urine dipstick: NEGATIVE
KETONES UR: 5 mg/dL — AB
Nitrite: NEGATIVE
Protein, ur: 30 mg/dL — AB
Specific Gravity, Urine: 1.024 (ref 1.005–1.030)
pH: 6 (ref 5.0–8.0)

## 2018-04-22 LAB — RAPID URINE DRUG SCREEN, HOSP PERFORMED
Amphetamines: NOT DETECTED
BARBITURATES: NOT DETECTED
Benzodiazepines: NOT DETECTED
Cocaine: NOT DETECTED
Opiates: NOT DETECTED
Tetrahydrocannabinol: POSITIVE — AB

## 2018-04-22 LAB — COMPREHENSIVE METABOLIC PANEL
ALT: 14 U/L (ref 0–44)
AST: 22 U/L (ref 15–41)
Albumin: 2.7 g/dL — ABNORMAL LOW (ref 3.5–5.0)
Alkaline Phosphatase: 62 U/L (ref 38–126)
Anion gap: 9 (ref 5–15)
BUN: 7 mg/dL (ref 6–20)
CO2: 22 mmol/L (ref 22–32)
Calcium: 8.8 mg/dL — ABNORMAL LOW (ref 8.9–10.3)
Chloride: 107 mmol/L (ref 98–111)
Creatinine, Ser: 0.86 mg/dL (ref 0.44–1.00)
GFR calc Af Amer: >60 mL/min (ref >60)
GFR calc non Af Amer: >60 mL/min (ref >60)
Glucose, Bld: 96 mg/dL (ref 70–99)
Potassium: 3.7 mmol/L (ref 3.5–5.1)
SODIUM: 138 mmol/L (ref 135–145)
Total Bilirubin: 0.1 mg/dL — ABNORMAL LOW (ref 0.3–1.2)
Total Protein: 6.8 g/dL (ref 6.5–8.1)

## 2018-04-22 MED ORDER — NITROFURANTOIN MONOHYD MACRO 100 MG PO CAPS: 100.0000 mg | ORAL_CAPSULE | Freq: Two times a day (BID) | ORAL | 0 refills | Status: AC

## 2018-04-22 MED ORDER — NITROFURANTOIN MONOHYD MACRO 100 MG PO CAPS: 100.0000 mg | ORAL_CAPSULE | Freq: Two times a day (BID) | ORAL | 0 refills | Status: DC

## 2018-04-22 MED ORDER — MORPHINE SULFATE (PF) 4 MG/ML IV SOLN
4.0000 mg | Freq: Once | INTRAVENOUS | Status: AC
  Administered 2018-04-22: 4 mg via INTRAVENOUS
  Filled 2018-04-22: qty 1

## 2018-04-22 MED ORDER — NITROFURANTOIN MONOHYD MACRO 100 MG PO CAPS
100.0000 mg | ORAL_CAPSULE | Freq: Once | ORAL | Status: DC
  Filled 2018-04-22 (×2): qty 1

## 2018-04-22 MED ORDER — LACTATED RINGERS IV BOLUS
1000.0000 mL | Freq: Once | INTRAVENOUS | Status: AC
  Administered 2018-04-22: 1000 mL via INTRAVENOUS

## 2018-04-22 MED ORDER — METOCLOPRAMIDE HCL 5 MG/ML IJ SOLN
5.0000 mg | Freq: Once | INTRAMUSCULAR | Status: AC
  Administered 2018-04-22: 5 mg via INTRAVENOUS
  Filled 2018-04-22: qty 2

## 2018-04-22 NOTE — Discharge Planning (Signed)
Breanne Olvera J. Lucretia Roers, RN, BSN, [place] [phone]  [facility] set up appointment with [facility] on 2/19.  Spoke with pt at bedside and advised to please arrive 15 min early and take a picture ID and your current medications.  Pt verbalizes understanding of keeping appointment but may change the time.

## 2018-04-22 NOTE — ED Notes (Signed)
Pt verbalized understanding of discharge instructions and denies any further questions at this time.   

## 2018-04-22 NOTE — Discharge Instructions (Addendum)
You have been scheduled an appointment with the [facility] - please do not miss this appointment. Your urine sample today had increased protein. This and your initial elevated blood pressure put you at increased risk for pre-eclampsia.   You also were positive for a urinary tract infection.  Please take Nitrofurantoin 100 MG tablet every 12 hours for five days.   Please follow-up with a dentist for your tooth ache. Take Tylenol as needed for your pain but do not consume more than 4g in a 24 hour period.

## 2018-04-22 NOTE — ED Provider Notes (Signed)
23 year old female who is currently [redacted] weeks pregnant presents with left second molar tooth pain.  Denies any fever.  Notes decreased oral intake due to her discomfort.  Has had some emesis.  Denies any vaginal bleeding or discharge.  On exam she has no appreciable severe left jaw swelling.  Has no palpable abscess.  Patient is mildly tachycardic and will be given lactated Ringer's as well as only for pain and will check electrolytes.   Lorre NickAllen, Glendia Olshefski, MD 04/22/18 1013

## 2018-04-22 NOTE — ED Notes (Signed)
Pt called out for nausea, EDP notified. New order obtained.

## 2018-04-22 NOTE — ED Provider Notes (Signed)
[facility] EMERGENCY DEPARTMENT Provider Note   CSN: [id number] Arrival date & time: 04/22/18  0901  History   Chief Complaint Chief Complaint  Patient presents with  . Facial Swelling  . Hip Pain    HPI Kristy Reid is a 23 y.o. female with PMH anemia during pregnancy, root canal three months ago, [redacted] weeks pregnant presenting with intense pain in the left side of her jaw where her root canal was. She states she has had to have repeated work on her posterior left lower tooth and the nerve is exposed. She is tearful and states nothing has been helping the pain. She has taken 4g of tylenol for the past two days and one toradol.  She has not seen an Ob since November when she first became pregnant. She has a history of anemia during pregnancy previously requiring two transfusions. She endorses nausea from the pain and vomiting and has had difficulty eating and drinking. She endorses chronic constipation. She denies dysuria but endorses decreased urination.   HPI  Past Medical History:  Diagnosis Date  . Anemia   . Anemia   . Anxiety   . Asthma   . Bipolar 1 disorder (HCC)   . Chronic back pain   . Depression   . Insomnia   . Personality disorder (HCC)    "boarderline"  . PTSD (post-traumatic stress disorder)     Patient Active Problem List   Diagnosis Date Noted  . Anxiety disorder   . Bipolar 2 disorder (HCC) 11/11/2016  . Noncompliance 11/11/2016  . Severe recurrent major depression with psychotic features (HCC) 07/18/2016  . Depression, major, recurrent, severe with psychosis (HCC) 07/17/2016  . Normal labor 05/07/2016  . Labor and delivery, indication for care 04/19/2016  . Indication for care in labor and delivery, antepartum 04/06/2016  . Decreased fetal movement 03/22/2016  . Abdominal pain in pregnancy 01/21/2016  . Constipation during pregnancy in second trimester 12/26/2015  . Overdose 11/29/2015  . Pregnancy (I trimester) 11/15/2015  .  Cannabis use disorder, severe, dependence (HCC) 11/15/2015  . Tobacco use disorder 11/15/2015  . Asthma 11/15/2015  . Borderline personality disorder (HCC) 11/15/2015  . PTSD (post-traumatic stress disorder) 11/15/2015  . Severe recurrent major depression without psychotic features (HCC) 11/13/2015    Past Surgical History:  Procedure Laterality Date  . BUNIONECTOMY Bilateral    feet  . wrist sugery       OB History    Gravida  4   Para  2   Term  2   Preterm  0   AB  1   Living  2     SAB  1   TAB  0   Ectopic  0   Multiple  0   Live Births  2            Home Medications    Prior to Admission medications   Medication Sig Start Date End Date Taking? Authorizing Provider  citalopram (CELEXA) 10 MG tablet Take 1 tablet (10 mg total) by mouth at bedtime. Patient not taking: Reported on 04/04/2018 09/24/17   [name], MD  cyclobenzaprine (FLEXERIL) 10 MG tablet Take 1 tablet (10 mg total) by mouth 3 (three) times daily as needed. Patient not taking: Reported on 04/04/2018 09/08/17   Joni Reining, PA-C  ferrous sulfate 325 (65 FE) MG tablet Take 1 tablet (325 mg total) by mouth daily with breakfast. Patient not taking: Reported on 04/04/2018 09/24/17  [name]'Neal, Sarita, MD  fluticasone (FLONASE) 50 MCG/ACT nasal spray Place 1 spray into both nostrils 2 (two) times daily. Patient not taking: Reported on 04/04/2018 09/24/17   [name]'Neal, Sarita, MD  hydrOXYzine (ATARAX/VISTARIL) 50 MG tablet Take 1 tablet (50 mg total) by mouth 3 (three) times daily as needed for anxiety. Patient not taking: Reported on 04/04/2018 09/24/17   [name]'Neal, Sarita, MD  loratadine (CLARITIN) 10 MG tablet Take 1 tablet (10 mg total) by mouth daily. Patient not taking: Reported on 04/04/2018 09/24/17   [name]'Neal, Sarita, MD  Melatonin 5 MG TABS Take 1 tablet (5 mg total) by mouth at bedtime. Patient not taking: Reported on 04/04/2018 09/24/17   [name]'Neal, Sarita, MD  nitrofurantoin,  macrocrystal-monohydrate, (MACROBID) 100 MG capsule Take 1 capsule (100 mg total) by mouth every 12 (twelve) hours for 9 days. 04/22/18 05/01/18  Lex Linhares A, DO  predniSONE (DELTASONE) 50 MG tablet Take 1 tablet (50 mg total) by mouth daily. 04/04/18   Linwood DibblesKnapp, Jon, MD  predniSONE (DELTASONE) 50 MG tablet Take 1 tablet (50 mg total) by mouth daily. 04/04/18   Linwood DibblesKnapp, Jon, MD  promethazine (PHENERGAN) 25 MG tablet Take 1 tablet (25 mg total) by mouth every 6 (six) hours as needed for nausea or vomiting. 04/04/18   Linwood DibblesKnapp, Jon, MD  promethazine (PHENERGAN) 25 MG tablet Take 1 tablet (25 mg total) by mouth every 6 (six) hours as needed for nausea or vomiting. 04/04/18   Linwood DibblesKnapp, Jon, MD  QUEtiapine (SEROQUEL) 300 MG tablet Take 1 tablet (300 mg total) by mouth at bedtime. Patient not taking: Reported on 04/04/2018 09/24/17   [name]'Neal, Sarita, MD  traZODone (DESYREL) 150 MG tablet Take 1 tablet (150 mg total) by mouth at bedtime as needed for sleep. Patient not taking: Reported on 04/04/2018 09/24/17   [name]'Neal, Sarita, MD    Family History Family History  Problem Relation Age of Onset  . Diabetes Mother   . Diabetes Maternal Grandmother   . Heart disease Maternal Grandmother   . Cancer Neg Hx   . Ovarian cancer Neg Hx     Social History Social History   Tobacco Use  . Smoking status: Former Smoker    Types: Cigarettes    Last attempt to quit: 08/13/2015    Years since quitting: 2.6  . Smokeless tobacco: Never Used  Substance Use Topics  . Alcohol use: No  . Drug use: Yes    Frequency: 1.0 times per week    Types: Marijuana    Comment: Last use 10/21/16     Allergies   Penicillins; Rocephin [ceftriaxone]; Zithromax [azithromycin]; and Lidocaine   Review of Systems Review of Systems  ROS negative except as noted in HPI.    Physical Exam Updated Vital Signs BP 127/81   Pulse 88   Temp 98.4 F (36.9 C) (Oral)   Resp 16   LMP  (LMP Unknown)   SpO2 100%   Physical Exam Constitution:  tearful, mild distress  HENT: no swelling or erythema of jaw, non-erythematous, TTP, gums without erythema or swelling, lower molars intact  Eyes: eom intact, no icterus Cardio: tachycardic, regular rhythm  Respiratory: decreased breath sounds, otherwise CTA  Abdominal: Diffusely TTP, +BS, non-distended, pregnant appearing, soft MSK: no edema, moving all extremities  Neuro: a&o, cooperative Skin: c/d/i   Fetal Heart Tones: 146-150   ED Treatments / Results  Labs (all labs ordered are listed, but only abnormal results are displayed) Labs Reviewed  CBC - Abnormal; Notable for the following components:  Result Value   Hemoglobin 8.6 (*)    HCT 29.9 (*)    MCV 76.5 (*)    MCH 22.0 (*)    MCHC 28.8 (*)    RDW 17.4 (*)    All other components within normal limits  COMPREHENSIVE METABOLIC PANEL - Abnormal; Notable for the following components:   Calcium 8.8 (*)    Albumin 2.7 (*)    Total Bilirubin 0.1 (*)    All other components within normal limits  URINALYSIS, ROUTINE W REFLEX MICROSCOPIC - Abnormal; Notable for the following components:   APPearance HAZY (*)    Ketones, ur 5 (*)    Protein, ur 30 (*)    Leukocytes, UA SMALL (*)    Bacteria, UA RARE (*)    All other components within normal limits  RAPID URINE DRUG SCREEN, HOSP PERFORMED - Abnormal; Notable for the following components:   Tetrahydrocannabinol POSITIVE (*)    All other components within normal limits  URINE CULTURE    EKG None  Radiology No results found.  Procedures Procedures (including critical care time)  Medications Ordered in ED Medications  nitrofurantoin (macrocrystal-monohydrate) (MACROBID) capsule 100 mg (100 mg Oral Not Given 04/22/18 1403)  lactated ringers bolus 1,000 mL (0 mLs Intravenous Stopped 04/22/18 1134)  lactated ringers bolus 1,000 mL (0 mLs Intravenous Stopped 04/22/18 1229)  morphine 4 MG/ML injection 4 mg (4 mg Intravenous Given 04/22/18 1040)  metoCLOPramide (REGLAN)  injection 5 mg (5 mg Intravenous Given 04/22/18 1219)     Initial Impression / Assessment and Plan / ED Course  I have reviewed the triage vital signs and the nursing notes.  Pertinent labs & imaging results that were available during my care of the patient were reviewed by me and considered in my medical decision making (see chart for details).   22yo female [redacted] weeks pregnant with chronic anemia and root canal three months ago presenting with left lower jaw pain for the past several days. [place] recently moved from [place] and has not been able to follow-up with a new Ob since moving. She had protein in her urine and initial BP elevated. States previous pregnancy five years ago was insignificant for pre-eclampsia. Repeat blood pressures were normal and this initial elevation was likely related to her jaw pain, which was relieved with morphine. Social work was consulted and scheduled an [facility] with [facility] for her to follow-up.  Her urine was positive for UTI and she was provided with 6 days of nitrofurantoin as she has an allergy to PCN and cephalosporins, this was confirmed with the patient.  Her jaw pain symptoms were relieved with morphine, and she was able to eat. Additionally fluid resuscitated 2L as she endorsed decreased po intake. She can continue to take tylenol as needed for pain and will defer further pain management to Ob. Recommended patient follow-up with dentist for her tooth. There were no signs of infection, and per discussion with patient this is a recurring issue as the nerve of the tooth is exposed.   Final Clinical Impressions(s) / ED Diagnoses   Final diagnoses:  [redacted] weeks gestation of pregnancy  Pain in lower jaw  Tooth pain  Chronic anemia    ED Discharge Orders         Ordered    nitrofurantoin, macrocrystal-monohydrate, (MACROBID) 100 MG capsule  Every 12 hours,   Status:  Discontinued     04/22/18 1302    nitrofurantoin, macrocrystal-monohydrate,  (MACROBID) 100 MG capsule  Every  12 hours     04/22/18 1401           Tillman Kazmierski A, DO 04/22/18 1411    Lorre Nick, MD 04/23/18 1124

## 2018-04-22 NOTE — ED Notes (Signed)
Case management and Social work at bedside.

## 2018-04-22 NOTE — ED Notes (Signed)
Pt states "I am 24 weeks preg, I moved here from [place] in December with some prenatal care here and there, I don't know the gender of the baby, I do feel it move some times and I am taking tylenol daily and I have a hx of sciatica and anemia."

## 2018-04-22 NOTE — Progress Notes (Signed)
CSW consulted as pt is in need of further prenatal care. CSW spoke with pt at bedside and was informed that pt recently moved here from [place]. Pt reports that pt hasn't been getting prenatal care for this pregancy as of this time. CSW spoke with RNCM to see if pt could be set up with OB/GYN for further prenatal needs. RNCM has scheduled an appointment for pt at this time. CSW has given pt resources for pedertricians. Pt reports that she has already applied for Medicaid but it is still pending. Pt report that's he gets Sales executive and WIC as well.    At this time there are no further CSW needs. CSW will sign off.    Kristy Reid. Kristy Reid, MSW, LCSW-A Emergency Department Clinical Social Worker [phone]

## 2018-04-22 NOTE — ED Notes (Signed)
Will have to collect another urine for culture.

## 2018-04-22 NOTE — ED Triage Notes (Signed)
States is 24 weeks preg having left jaw pain thathas hurt since yesterday hurts to talk, not sure it is a tooth, and having bilateral hip pain states   X 2 years  Since her last preg  Still has  NOT seen a OB dr states her HGB low the last time

## 2018-04-23 LAB — URINE CULTURE

## 2018-04-24 ENCOUNTER — Telehealth: Payer: Self-pay | Admitting: Emergency Medicine

## 2018-04-24 NOTE — Telephone Encounter (Signed)
Post ED Visit - Positive Culture Follow-up  Culture report reviewed by antimicrobial stewardship pharmacist:  []  Enzo BiNathan Batchelder, Pharm.D. []  Celedonio MiyamotoJeremy Frens, Pharm.D., BCPS AQ-ID []  Garvin FilaMike Maccia, Pharm.D., BCPS []  Georgina PillionElizabeth Martin, Pharm.D., BCPS []  HobgoodMinh Pham, [address] BoulevardPharm.D., BCPS, AAHIVP []  Estella HuskMichelle Turner, Pharm.D., BCPS, AAHIVP []  Lysle Pearlachel Rumbarger, PharmD, BCPS []  Phillips Climeshuy Dang, PharmD, BCPS []  Agapito GamesAlison Masters, PharmD, BCPS [x]  Vinnie LevelBenjamin Mancheril, PharmD  Positive urine culture Treated with Nitrofurantoin, organism sensitive to the same and no further patient follow-up is required at this time.  Carollee HerterShannon Anthonia Monger 04/24/2018, 1:00 PM

## 2018-04-27 ENCOUNTER — Encounter (HOSPITAL_COMMUNITY): Payer: Self-pay | Admitting: Emergency Medicine

## 2018-04-27 ENCOUNTER — Emergency Department (HOSPITAL_COMMUNITY): Payer: Medicaid Other

## 2018-04-27 ENCOUNTER — Inpatient Hospital Stay (HOSPITAL_COMMUNITY)
Admission: AD | Admit: 2018-04-27 | Discharge: 2018-04-27 | Disposition: A | Discharge status: Home / Self Care | Payer: Medicaid Other | Attending: Obstetrics & Gynecology | Admitting: Obstetrics & Gynecology

## 2018-04-27 ENCOUNTER — Other Ambulatory Visit: Payer: Self-pay

## 2018-04-27 DIAGNOSIS — R12 Heartburn: Secondary | ICD-10-CM | POA: Diagnosis not present

## 2018-04-27 DIAGNOSIS — Z87891 Personal history of nicotine dependence: Secondary | ICD-10-CM | POA: Insufficient documentation

## 2018-04-27 DIAGNOSIS — W109XXA Fall (on) (from) unspecified stairs and steps, initial encounter: Secondary | ICD-10-CM | POA: Insufficient documentation

## 2018-04-27 DIAGNOSIS — R05 Cough: Secondary | ICD-10-CM | POA: Diagnosis not present

## 2018-04-27 DIAGNOSIS — Z3A25 25 weeks gestation of pregnancy: Secondary | ICD-10-CM | POA: Diagnosis not present

## 2018-04-27 DIAGNOSIS — R69 Illness, unspecified: Secondary | ICD-10-CM

## 2018-04-27 DIAGNOSIS — O99612 Diseases of the digestive system complicating pregnancy, second trimester: Secondary | ICD-10-CM | POA: Insufficient documentation

## 2018-04-27 DIAGNOSIS — Z880 Allergy status to penicillin: Secondary | ICD-10-CM | POA: Insufficient documentation

## 2018-04-27 DIAGNOSIS — J111 Influenza due to unidentified influenza virus with other respiratory manifestations: Secondary | ICD-10-CM

## 2018-04-27 DIAGNOSIS — O368120 Decreased fetal movements, second trimester, not applicable or unspecified: Secondary | ICD-10-CM

## 2018-04-27 DIAGNOSIS — O26892 Other specified pregnancy related conditions, second trimester: Secondary | ICD-10-CM | POA: Insufficient documentation

## 2018-04-27 DIAGNOSIS — R55 Syncope and collapse: Secondary | ICD-10-CM

## 2018-04-27 LAB — COMPREHENSIVE METABOLIC PANEL
ALK PHOS: 68 U/L (ref 38–126)
ALT: 12 U/L (ref 0–44)
AST: 19 U/L (ref 15–41)
Albumin: 2.9 g/dL — ABNORMAL LOW (ref 3.5–5.0)
Anion gap: 12 (ref 5–15)
BILIRUBIN TOTAL: 0.3 mg/dL (ref 0.3–1.2)
CO2: 18 mmol/L — ABNORMAL LOW (ref 22–32)
Calcium: 8.9 mg/dL (ref 8.9–10.3)
Chloride: 104 mmol/L (ref 98–111)
Creatinine, Ser: 0.81 mg/dL (ref 0.44–1.00)
GFR calc Af Amer: >60 mL/min (ref >60)
GFR calc non Af Amer: >60 mL/min (ref >60)
Glucose, Bld: 90 mg/dL (ref 70–99)
Potassium: 3.4 mmol/L — ABNORMAL LOW (ref 3.5–5.1)
Sodium: 134 mmol/L — ABNORMAL LOW (ref 135–145)
Total Protein: 7 g/dL (ref 6.5–8.1)

## 2018-04-27 LAB — CBC WITH DIFFERENTIAL/PLATELET
ABS IMMATURE GRANULOCYTES: 0.07 10*3/uL (ref 0.00–0.07)
Basophils Absolute: 0 10*3/uL (ref 0.0–0.1)
Basophils Relative: 0 %
Eosinophils Absolute: 0 10*3/uL (ref 0.0–0.5)
Eosinophils Relative: 0 %
HCT: 30.3 % — ABNORMAL LOW (ref 36.0–46.0)
HEMOGLOBIN: 8.6 g/dL — AB (ref 12.0–15.0)
Immature Granulocytes: 1 %
Lymphocytes Relative: 15 %
Lymphs Abs: 1.5 10*3/uL (ref 0.7–4.0)
MCH: 21.4 pg — ABNORMAL LOW (ref 26.0–34.0)
MCHC: 28.4 g/dL — ABNORMAL LOW (ref 30.0–36.0)
MCV: 75.4 fL — ABNORMAL LOW (ref 80.0–100.0)
Monocytes Absolute: 0.9 10*3/uL (ref 0.1–1.0)
Monocytes Relative: 9 %
NEUTROS ABS: 7.5 10*3/uL (ref 1.7–7.7)
Neutrophils Relative %: 75 %
Platelets: 241 10*3/uL (ref 150–400)
RBC: 4.02 MIL/uL (ref 3.87–5.11)
RDW: 17.4 % — ABNORMAL HIGH (ref 11.5–15.5)
WBC: 10 10*3/uL (ref 4.0–10.5)
nRBC: 0.4 % — ABNORMAL HIGH (ref 0.0–0.2)

## 2018-04-27 LAB — URINALYSIS, ROUTINE W REFLEX MICROSCOPIC
Bilirubin Urine: NEGATIVE
GLUCOSE, UA: NEGATIVE mg/dL
HGB URINE DIPSTICK: NEGATIVE
Ketones, ur: NEGATIVE mg/dL
Leukocytes,Ua: NEGATIVE
Nitrite: NEGATIVE
Protein, ur: NEGATIVE mg/dL
Specific Gravity, Urine: 1.016 (ref 1.005–1.030)
pH: 7 (ref 5.0–8.0)

## 2018-04-27 LAB — MAGNESIUM: Magnesium: 2 mg/dL (ref 1.7–2.4)

## 2018-04-27 LAB — INFLUENZA PANEL BY PCR (TYPE A & B)
INFLAPCR: POSITIVE — AB
Influenza B By PCR: NEGATIVE

## 2018-04-27 MED ORDER — SODIUM CHLORIDE 0.9 % IV BOLUS
1000.0000 mL | Freq: Once | INTRAVENOUS | Status: AC
  Administered 2018-04-27: 1000 mL via INTRAVENOUS

## 2018-04-27 MED ORDER — GUAIFENESIN ER 600 MG PO TB12: 600.0000 mg | ORAL_TABLET | Freq: Two times a day (BID) | ORAL | 1 refills | Status: DC

## 2018-04-27 MED ORDER — HYDROCOD POLST-CPM POLST ER 10-8 MG/5ML PO SUER: 5.0000 mL | Freq: Two times a day (BID) | ORAL | 0 refills | Status: DC | PRN

## 2018-04-27 MED ORDER — GUAIFENESIN-DM 100-10 MG/5ML PO SYRP
10.0000 mL | ORAL_SOLUTION | ORAL | Status: DC | PRN
  Administered 2018-04-27: 10 mL via ORAL
  Filled 2018-04-27 (×2): qty 10

## 2018-04-27 MED ORDER — PANTOPRAZOLE SODIUM 40 MG IV SOLR
40.0000 mg | Freq: Once | INTRAVENOUS | Status: AC
  Administered 2018-04-27: 40 mg via INTRAVENOUS
  Filled 2018-04-27: qty 40

## 2018-04-27 MED ORDER — SODIUM CHLORIDE 0.9 % IV SOLN
INTRAVENOUS | Status: AC | PRN
  Administered 2018-04-27: 1000 mL via INTRAVENOUS

## 2018-04-27 MED ORDER — LACTATED RINGERS IV BOLUS
500.0000 mL | Freq: Once | INTRAVENOUS | Status: AC
  Administered 2018-04-27: 500 mL via INTRAVENOUS

## 2018-04-27 MED ORDER — ACETAMINOPHEN 500 MG PO TABS
1000.0000 mg | ORAL_TABLET | Freq: Once | ORAL | Status: AC
  Administered 2018-04-27: 1000 mg via ORAL
  Filled 2018-04-27: qty 2

## 2018-04-27 MED ORDER — OSELTAMIVIR PHOSPHATE 75 MG PO CAPS
75.0000 mg | ORAL_CAPSULE | Freq: Once | ORAL | Status: AC
  Administered 2018-04-27: 75 mg via ORAL
  Filled 2018-04-27: qty 1

## 2018-04-27 MED ORDER — METOCLOPRAMIDE HCL 5 MG/ML IJ SOLN
5.0000 mg | Freq: Once | INTRAMUSCULAR | Status: AC
  Administered 2018-04-27: 5 mg via INTRAVENOUS
  Filled 2018-04-27: qty 2

## 2018-04-27 NOTE — ED Provider Notes (Signed)
[facility] EMERGENCY DEPARTMENT Provider Note   CSN: [id number] Arrival date & time: 04/27/18  1610     History   Chief Complaint Chief Complaint  Patient presents with  . Trauma    HPI Kristy Reid is a 23 y.o. female.  23 yo F with a chief complaints of abdominal cramping and not feeling the baby move.  The patient had a fall when she was trying to get up off the couch she felt lightheaded and fell onto her abdomen.  She is gravida 4 para 3.  Reportedly she was diagnosed with preeclampsia last week.  She denies vaginal bleeding or discharge.  The history is provided by the patient.  Trauma Mechanism of injury: fall Injury location: torso Injury location detail: abdomen Incident location: home Time since incident: 2 hours Arrived directly from scene: yes   Fall:      Fall occurred: tripped      Impact surface: hard floor      Point of impact: abdomen.      Entrapped after fall: no  Protective equipment:       None      Suspicion of alcohol use: no      Suspicion of drug use: no  EMS/PTA data:      Bystander interventions: none      Ambulatory at scene: yes      Blood loss: none      Responsiveness: alert      Oriented to: person, place, situation and time      Loss of consciousness: yes      Loss of consciousness duration: 2 seconds      Amnesic to event: no      Airway interventions: none      Breathing interventions: none      IV access: none      IO access: none      Fluids administered: none  Current symptoms:      Associated symptoms:            Reports abdominal pain (cramping) and loss of consciousness.            Denies chest pain, headache, nausea and vomiting.    Past Medical History:  Diagnosis Date  . Anemia   . Anemia   . Anxiety   . Asthma   . Bipolar 1 disorder (HCC)   . Chronic back pain   . Depression   . Insomnia   . Personality disorder (HCC)    "boarderline"  . PTSD (post-traumatic stress disorder)      Patient Active Problem List   Diagnosis Date Noted  . Anxiety disorder   . Bipolar 2 disorder (HCC) 11/11/2016  . Noncompliance 11/11/2016  . Severe recurrent major depression with psychotic features (HCC) 07/18/2016  . Depression, major, recurrent, severe with psychosis (HCC) 07/17/2016  . Normal labor 05/07/2016  . Labor and delivery, indication for care 04/19/2016  . Indication for care in labor and delivery, antepartum 04/06/2016  . Decreased fetal movement 03/22/2016  . Abdominal pain in pregnancy 01/21/2016  . Constipation during pregnancy in second trimester 12/26/2015  . Overdose 11/29/2015  . Pregnancy (I trimester) 11/15/2015  . Cannabis use disorder, severe, dependence (HCC) 11/15/2015  . Tobacco use disorder 11/15/2015  . Asthma 11/15/2015  . Borderline personality disorder (HCC) 11/15/2015  . PTSD (post-traumatic stress disorder) 11/15/2015  . Severe recurrent major depression without psychotic features (HCC) 11/13/2015    Past Surgical History:  Procedure Laterality  Date  . BUNIONECTOMY Bilateral    feet  . wrist sugery       OB History    Gravida  4   Para  2   Term  2   Preterm  0   AB  1   Living  2     SAB  1   TAB  0   Ectopic  0   Multiple  0   Live Births  2            Home Medications    Prior to Admission medications   Medication Sig Start Date End Date Taking? Authorizing Provider  citalopram (CELEXA) 10 MG tablet Take 1 tablet (10 mg total) by mouth at bedtime. Patient not taking: Reported on 04/04/2018 09/24/17   [name], MD  cyclobenzaprine (FLEXERIL) 10 MG tablet Take 1 tablet (10 mg total) by mouth 3 (three) times daily as needed. Patient not taking: Reported on 04/04/2018 09/08/17   Joni Reining, PA-C  ferrous sulfate 325 (65 FE) MG tablet Take 1 tablet (325 mg total) by mouth daily with breakfast. Patient not taking: Reported on 04/04/2018 09/24/17   [name], MD  fluticasone (FLONASE) 50 MCG/ACT  nasal spray Place 1 spray into both nostrils 2 (two) times daily. Patient not taking: Reported on 04/04/2018 09/24/17   [name], MD  hydrOXYzine (ATARAX/VISTARIL) 50 MG tablet Take 1 tablet (50 mg total) by mouth 3 (three) times daily as needed for anxiety. Patient not taking: Reported on 04/04/2018 09/24/17   [name], MD  loratadine (CLARITIN) 10 MG tablet Take 1 tablet (10 mg total) by mouth daily. Patient not taking: Reported on 04/04/2018 09/24/17   [name], MD  Melatonin 5 MG TABS Take 1 tablet (5 mg total) by mouth at bedtime. Patient not taking: Reported on 04/04/2018 09/24/17   [name], MD  nitrofurantoin, macrocrystal-monohydrate, (MACROBID) 100 MG capsule Take 1 capsule (100 mg total) by mouth every 12 (twelve) hours for 9 days. 04/22/18 05/01/18  Seawell, Jaimie A, DO  predniSONE (DELTASONE) 50 MG tablet Take 1 tablet (50 mg total) by mouth daily. 04/04/18   Linwood Dibbles, MD  predniSONE (DELTASONE) 50 MG tablet Take 1 tablet (50 mg total) by mouth daily. 04/04/18   Linwood Dibbles, MD  promethazine (PHENERGAN) 25 MG tablet Take 1 tablet (25 mg total) by mouth every 6 (six) hours as needed for nausea or vomiting. 04/04/18   Linwood Dibbles, MD  promethazine (PHENERGAN) 25 MG tablet Take 1 tablet (25 mg total) by mouth every 6 (six) hours as needed for nausea or vomiting. 04/04/18   Linwood Dibbles, MD  QUEtiapine (SEROQUEL) 300 MG tablet Take 1 tablet (300 mg total) by mouth at bedtime. Patient not taking: Reported on 04/04/2018 09/24/17   [name], MD  traZODone (DESYREL) 150 MG tablet Take 1 tablet (150 mg total) by mouth at bedtime as needed for sleep. Patient not taking: Reported on 04/04/2018 09/24/17   [name], MD    Family History Family History  Problem Relation Age of Onset  . Diabetes Mother   . Diabetes Maternal Grandmother   . Heart disease Maternal Grandmother   . Cancer Neg Hx   . Ovarian cancer Neg Hx     Social History Social History   Tobacco Use  .  Smoking status: Former Smoker    Types: Cigarettes    Last attempt to quit: 08/13/2015    Years since quitting: 2.7  . Smokeless tobacco: Never Used  Substance Use Topics  . Alcohol use: No  . Drug use: Yes    Frequency: 1.0 times per week    Types: Marijuana    Comment: Last use 10/21/16     Allergies   Penicillins; Rocephin [ceftriaxone]; Zithromax [azithromycin]; and Lidocaine   Review of Systems Review of Systems  Constitutional: Negative for chills and fever.  HENT: Negative for congestion and rhinorrhea.   Eyes: Negative for redness and visual disturbance.  Respiratory: Negative for shortness of breath and wheezing.   Cardiovascular: Negative for chest pain and palpitations.  Gastrointestinal: Positive for abdominal pain (cramping). Negative for nausea and vomiting.  Genitourinary: Negative for dysuria and urgency.  Musculoskeletal: Negative for arthralgias and myalgias.  Skin: Negative for pallor and wound.  Neurological: Positive for loss of consciousness. Negative for dizziness and headaches.     Physical Exam Updated Vital Signs BP 124/72   Pulse (!) 122   Temp (!) 100.4 F (38 C) (Oral)   Resp 20   LMP  (LMP Unknown)   SpO2 100%   Physical Exam Vitals signs and nursing note reviewed.  Constitutional:      General: She is not in acute distress.    Appearance: She is well-developed. She is not diaphoretic.  HENT:     Head: Normocephalic and atraumatic.  Eyes:     Pupils: Pupils are equal, round, and reactive to light.  Neck:     Musculoskeletal: Normal range of motion and neck supple.  Cardiovascular:     Rate and Rhythm: Normal rate and regular rhythm.     Heart sounds: No murmur. No friction rub. No gallop.   Pulmonary:     Effort: Pulmonary effort is normal.     Breath sounds: No wheezing or rales.  Abdominal:     General: There is no distension.     Palpations: Abdomen is soft.     Tenderness: There is abdominal tenderness. There is no  guarding.     Comments: Gravid, mild diffuse abdominal tenderness, no bruising or other signs of trauma  Musculoskeletal:        General: No tenderness.  Skin:    General: Skin is warm and dry.  Neurological:     Mental Status: She is alert and oriented to person, place, and time.  Psychiatric:        Behavior: Behavior normal.      ED Treatments / Results  Labs (all labs ordered are listed, but only abnormal results are displayed) Labs Reviewed  CBC WITH DIFFERENTIAL/PLATELET - Abnormal; Notable for the following components:      Result Value   Hemoglobin 8.6 (*)    HCT 30.3 (*)    MCV 75.4 (*)    MCH 21.4 (*)    MCHC 28.4 (*)    RDW 17.4 (*)    nRBC 0.4 (*)    All other components within normal limits  COMPREHENSIVE METABOLIC PANEL  MAGNESIUM  URINALYSIS, ROUTINE W REFLEX MICROSCOPIC  INFLUENZA PANEL BY PCR (TYPE A & B)    EKG EKG Interpretation  Date/Time:  Wednesday April 27 2018 16:20:27 EST Ventricular Rate:  119 PR Interval:    QRS Duration: 79 QT Interval:  301 QTC Calculation: 424 R Axis:   73 Text Interpretation:  Sinus tachycardia Since last tracing rate faster Otherwise no significant change Confirmed by Melene Plan [phone]) on 04/27/2018 4:23:26 PM   Radiology No results found.  Procedures Procedures (including critical care time)  Medications Ordered in ED Medications  sodium chloride 0.9 % bolus 1,000 mL (1,000 mLs Intravenous New Bag/Given 04/27/18 1622)  acetaminophen (TYLENOL) tablet 1,000 mg (has no administration in time range)  0.9 %  sodium chloride infusion (1,000 mLs Intravenous New Bag/Given 04/27/18 1621)     Initial Impression / Assessment and Plan / ED Course  I have reviewed the triage vital signs and the nursing notes.  Pertinent labs & imaging results that were available during my care of the patient were reviewed by me and considered in my medical decision making (see chart for details).     23 yo F gravida 4 para 3  with a chief complaint of abdominal pain and decreased fetal movement.  The patient was getting up off the couch felt lightheaded and fell onto the ground.  She does not think that she passed out.  Complaining of pain and cramping to her abdomen.  As she was just reportedly diagnosed with preeclampsia will obtain a CBC and a CMP, patient is coughing fairly frequently in the room and has had a likely viral illness going on for the past few days.  We will give a bolus of fluids chest x-ray urine.  The patient is febrile here and her symptoms are somewhat concerning for influenza.  I have ordered a rapid influenza test, patient was evaluated by rapid response OB and felt that she would be better served by transfer to the [facility].  The patients results and plan were reviewed and discussed.   Any x-rays performed were independently reviewed by myself.   Differential diagnosis were considered with the presenting HPI.  Medications  sodium chloride 0.9 % bolus 1,000 mL (1,000 mLs Intravenous New Bag/Given 04/27/18 1622)  acetaminophen (TYLENOL) tablet 1,000 mg (has no administration in time range)  0.9 %  sodium chloride infusion (1,000 mLs Intravenous New Bag/Given 04/27/18 1621)    Vitals:   04/27/18 1617  BP: 124/72  Pulse: (!) 122  Resp: 20  Temp: (!) 100.4 F (38 C)  TempSrc: Oral  SpO2: 100%    Final diagnoses:  Influenza-like illness  Syncope and collapse  Decreased fetal movements in second trimester, single or unspecified fetus    Admission/ observation were discussed with the admitting physician, patient and/or family and they are comfortable with the plan.    Final Clinical Impressions(s) / ED Diagnoses   Final diagnoses:  Influenza-like illness  Syncope and collapse  Decreased fetal movements in second trimester, single or unspecified fetus    ED Discharge Orders    None       Melene Plan, DO 04/27/18 1644

## 2018-04-27 NOTE — Discharge Instructions (Signed)
Influenza, Adult Influenza, more commonly known as "the flu," is a viral infection that mainly affects the respiratory tract. The respiratory tract includes organs that help you breathe, such as the lungs, nose, and throat. The flu causes many symptoms similar to the common cold along with high fever and body aches. The flu spreads easily from person to person (is contagious). Getting a flu shot (influenza vaccination) every year is the best way to prevent the flu. What are the causes? This condition is caused by the influenza virus. You can get the virus by:  Breathing in droplets that are in the air from an infected person's cough or sneeze.  Touching something that has been exposed to the virus (has been contaminated) and then touching your mouth, nose, or eyes. What increases the risk? The following factors may make you more likely to get the flu:  Not washing or sanitizing your hands often.  Having close contact with many people during cold and flu season.  Touching your mouth, eyes, or nose without first washing or sanitizing your hands.  Not getting a yearly (annual) flu shot. You may have a higher risk for the flu, including serious problems such as a lung infection (pneumonia), if you:  Are older than 65.  Are pregnant.  Have a weakened disease-fighting system (immune system). You may have a weakened immune system if you: ? Have HIV or AIDS. ? Are undergoing chemotherapy. ? Are taking medicines that reduce (suppress) the activity of your immune system.  Have a long-term (chronic) illness, such as heart disease, kidney disease, diabetes, or lung disease.  Have a liver disorder.  Are severely overweight (morbidly obese).  Have anemia. This is a condition that affects your red blood cells.  Have asthma. What are the signs or symptoms? Symptoms of this condition usually begin suddenly and last 4-14 days. They may include:  Fever and chills.  Headaches, body aches, or  muscle aches.  Sore throat.  Cough.  Runny or stuffy (congested) nose.  Chest discomfort.  Poor appetite.  Weakness or fatigue.  Dizziness.  Nausea or vomiting. How is this diagnosed? This condition may be diagnosed based on:  Your symptoms and medical history.  A physical exam.  Swabbing your nose or throat and testing the fluid for the influenza virus. How is this treated? If the flu is diagnosed early, you can be treated with medicine that can help reduce how severe the illness is and how long it lasts (antiviral medicine). This may be given by mouth (orally) or through an IV. Taking care of yourself at home can help relieve symptoms. Your health care provider may recommend:  Taking over-the-counter medicines.  Drinking plenty of fluids. In many cases, the flu goes away on its own. If you have severe symptoms or complications, you may be treated in a hospital. Follow these instructions at home: Activity  Rest as needed and get plenty of sleep.  Stay home from work or school as told by your health care provider. Unless you are visiting your health care provider, avoid leaving home until your fever has been gone for 24 hours without taking medicine. Eating and drinking  Take an oral rehydration solution (ORS). This is a drink that is sold at pharmacies and retail stores.  Drink enough fluid to keep your urine pale yellow.  Drink clear fluids in small amounts as you are able. Clear fluids include water, ice chips, diluted fruit juice, and low-calorie sports drinks.  Eat bland, easy-to-digest foods  in small amounts as you are able. These foods include bananas, applesauce, rice, lean meats, toast, and crackers.  Avoid drinking fluids that contain a lot of sugar or caffeine, such as energy drinks, regular sports drinks, and soda.  Avoid alcohol.  Avoid spicy or fatty foods. General instructions      Take over-the-counter and prescription medicines only as told  by your health care provider.  Use a cool mist humidifier to add humidity to the air in your home. This can make it easier to breathe.  Cover your mouth and nose when you cough or sneeze.  Wash your hands with soap and water often, especially after you cough or sneeze. If soap and water are not available, use alcohol-based hand sanitizer.  Keep all follow-up visits as told by your health care provider. This is important. How is this prevented?   Get an annual flu shot. You may get the flu shot in late summer, fall, or winter. Ask your health care provider when you should get your flu shot.  Avoid contact with people who are sick during cold and flu season. This is generally fall and winter. Contact a health care provider if:  You develop new symptoms.  You have: ? Chest pain. ? Diarrhea. ? A fever.  Your cough gets worse.  You produce more mucus.  You feel nauseous or you vomit. Get help right away if:  You develop shortness of breath or difficulty breathing.  Your skin or nails turn a bluish color.  You have severe pain or stiffness in your neck.  You develop a sudden headache or sudden pain in your face or ear.  You cannot eat or drink without vomiting. Summary  Influenza, more commonly known as "the flu," is a viral infection that primarily affects your respiratory tract.  Symptoms of the flu usually begin suddenly and last 4-14 days.  Getting an annual flu shot is the best way to prevent getting the flu.  Stay home from work or school as told by your health care provider. Unless you are visiting your health care provider, avoid leaving home until your fever has been gone for 24 hours without taking medicine.  Keep all follow-up visits as told by your health care provider. This is important. This information is not intended to replace advice given to you by your health care provider. Make sure you discuss any questions you have with your health care  provider. Document Released: 02/28/2000 Document Revised: 08/18/2017 Document Reviewed: 08/18/2017 Elsevier Interactive Patient Education  2019 [employer]  [place] [facility] Providers    [facility] [employer] at [facility]       Phone: [phone]  [facility] [employer] at [place] Phone: [phone]  [facility] [employer] at [place]  Phone: [phone]  [facility] for [facility] at [facility]  Phone: [phone]  Center for [facility] at [place]  Phone: [phone]  [facility]       Phone: [phone]  [facility]    Phone: [phone]   [facility])    Phone: [phone]  [facility]    Phone: [phone]  [facility]    Phone: [phone]  [facility]    Phone: [phone]  [facility]       Phone: [phone]   [facility]  Phone: [phone]  [facility]    Phone: [phone]  [facility] [place]   Phone: [phone]  [facility]      Phone: [phone]  [facility]    Phone: [phone]

## 2018-04-27 NOTE — ED Notes (Signed)
Called [facility] for pt transfer to [facility]- spoke with doug

## 2018-04-27 NOTE — ED Triage Notes (Signed)
Pt here as a level 2 trauma after falling on her stomach while getting up to go to the bathroom , pt is approx [redacted] weeks pregnant

## 2018-04-27 NOTE — ED Notes (Signed)
Unable to obtain signature from pt for transport to [facility] due to lack of portable computer.

## 2018-04-27 NOTE — Progress Notes (Signed)
Dr Macon Large notified about pt who reports being 25.[redacted] wk pregnant with flu like symptoms who fell at home once getting oob and once going down stairs.  Pt reports falling down between 9-12 stairs and landing on abdomen. She says she has no lof or vb. MD also notified that pt is being worked up for flu and pneumonia.  Dr Macon Large wants pt to receive 4 hrs of monitoring at [facility].  ED notified and [facility] called with report.

## 2018-04-27 NOTE — MAU Provider Note (Addendum)
History     CSN: [id number]  Arrival date and time: 04/27/18 1610   First Provider Initiated Contact with Patient 04/27/18 1758      Chief Complaint  Patient presents with  . Trauma   Dilana Donnald GarreJanae Reid is a 23 y.o. [phone] at 8250w1d who presents for prolonged monitoring s/p fall. She reports falling down the stairs earlier today and hitting her abdomen. She endorses stomach cramping (5-6/10) and denies vaginal leakage, bleeding, discharge, itching, burning, and/or odor.  She endorses fetal movement and denies contractions.  She endorses body aches that is 8-9/10.  [facility] and has been found to be positive for influenza A.  She states that she has not had anything to eat today and requests something for nausea and food.     RN Note: Dr Macon LargeAnyanwu notified about pt who reports being 25.[redacted] wk pregnant with flu like symptoms who fell at home once getting oob and once going down stairs.  Pt reports falling down between 9-12 stairs and landing on abdomen. She says she has no lof or vb. MD also notified that pt is being worked up for flu and pneumonia.  Dr Macon LargeAnyanwu [facility]  ED notified and mau called with report.  OB History    Gravida  4   Para  2   Term  2   Preterm  0   AB  1   Living  2     SAB  1   TAB  0   Ectopic  0   Multiple  0   Live Births  2           Past Medical History:  Diagnosis Date  . Anemia   . Anemia   . Anxiety   . Asthma   . Bipolar 1 disorder (HCC)   . Chronic back pain   . Depression   . Insomnia   . Personality disorder (HCC)    "boarderline"  . PTSD (post-traumatic stress disorder)     Past Surgical History:  Procedure Laterality Date  . BUNIONECTOMY Bilateral    feet  . wrist sugery      Family History  Problem Relation Age of Onset  . Diabetes Mother   . Diabetes Maternal Grandmother   . Heart disease Maternal Grandmother   . Cancer Neg Hx   . Ovarian cancer Neg Hx      Social History   Tobacco Use  . Smoking status: Former Smoker    Types: Cigarettes    Last attempt to quit: 08/13/2015    Years since quitting: 2.7  . Smokeless tobacco: Never Used  Substance Use Topics  . Alcohol use: No  . Drug use: Yes    Frequency: 7.0 times per week    Types: Marijuana    Allergies:  Allergies  Allergen Reactions  . Penicillins Hives    Has patient had a PCN reaction causing immediate rash, facial/tongue/throat swelling, SOB or lightheadedness with hypotension: No Has patient had a PCN reaction causing severe rash involving mucus membranes or skin necrosis: No Has patient had a PCN reaction that required hospitalization No Has patient had a PCN reaction occurring within the last 10 years: No If all of the above answers are "NO", then may proceed with Cephalosporin use.  . Rocephin [Ceftriaxone] Hives  . Zithromax [Azithromycin] Itching and Nausea And Vomiting  . Lidocaine Hives, Itching and Rash    Medications Prior  to Admission  Medication Sig Dispense Refill Last Dose  . citalopram (CELEXA) 10 MG tablet Take 1 tablet (10 mg total) by mouth at bedtime. (Patient not taking: Reported on 04/04/2018) 30 tablet 0 Not Taking at Unknown time  . cyclobenzaprine (FLEXERIL) 10 MG tablet Take 1 tablet (10 mg total) by mouth 3 (three) times daily as needed. (Patient not taking: Reported on 04/04/2018) 15 tablet 0 Not Taking at Unknown time  . ferrous sulfate 325 (65 FE) MG tablet Take 1 tablet (325 mg total) by mouth daily with breakfast. (Patient not taking: Reported on 04/04/2018) 30 tablet 0 Not Taking at Unknown time  . fluticasone (FLONASE) 50 MCG/ACT nasal spray Place 1 spray into both nostrils 2 (two) times daily. (Patient not taking: Reported on 04/04/2018) 16 g 0 Not Taking at Unknown time  . hydrOXYzine (ATARAX/VISTARIL) 50 MG tablet Take 1 tablet (50 mg total) by mouth 3 (three) times daily as needed for anxiety. (Patient not taking: Reported on 04/04/2018) 90  tablet 0 Not Taking at Unknown time  . loratadine (CLARITIN) 10 MG tablet Take 1 tablet (10 mg total) by mouth daily. (Patient not taking: Reported on 04/04/2018) 30 tablet 0 Not Taking at Unknown time  . Melatonin 5 MG TABS Take 1 tablet (5 mg total) by mouth at bedtime. (Patient not taking: Reported on 04/04/2018) 30 tablet 0 Not Taking at Unknown time  . nitrofurantoin, macrocrystal-monohydrate, (MACROBID) 100 MG capsule Take 1 capsule (100 mg total) by mouth every 12 (twelve) hours for 9 days. 12 capsule 0   . predniSONE (DELTASONE) 50 MG tablet Take 1 tablet (50 mg total) by mouth daily. 5 tablet 0   . predniSONE (DELTASONE) 50 MG tablet Take 1 tablet (50 mg total) by mouth daily. 5 tablet 0   . promethazine (PHENERGAN) 25 MG tablet Take 1 tablet (25 mg total) by mouth every 6 (six) hours as needed for nausea or vomiting. 12 tablet 0   . promethazine (PHENERGAN) 25 MG tablet Take 1 tablet (25 mg total) by mouth every 6 (six) hours as needed for nausea or vomiting. 12 tablet 0   . QUEtiapine (SEROQUEL) 300 MG tablet Take 1 tablet (300 mg total) by mouth at bedtime. (Patient not taking: Reported on 04/04/2018) 30 tablet 0 Not Taking at Unknown time  . traZODone (DESYREL) 150 MG tablet Take 1 tablet (150 mg total) by mouth at bedtime as needed for sleep. (Patient not taking: Reported on 04/04/2018) 30 tablet 0 Not Taking at Unknown time    Review of Systems  Constitutional: Positive for chills and fever.  Respiratory: Positive for cough. Negative for shortness of breath and wheezing.   Gastrointestinal: Positive for abdominal pain and nausea. Negative for diarrhea and vomiting.  Genitourinary: Negative for dysuria, pelvic pain, vaginal bleeding and vaginal discharge.  Neurological: Negative for dizziness, light-headedness and headaches.   Physical Exam   Blood pressure (!) 118/57, pulse (!) 105, temperature 99.6 F (37.6 C), resp. rate 16, SpO2 96 %, unknown if currently  breastfeeding.  Physical Exam  Constitutional: She is oriented to person, place, and time. She appears well-developed and well-nourished.  HENT:  Head: Normocephalic and atraumatic.  Eyes: Conjunctivae are normal.  Neck: Normal range of motion.  Cardiovascular: Normal rate, regular rhythm and normal heart sounds.  Respiratory: Effort normal and breath sounds normal. No respiratory distress.  Non productive cough  GI: Soft.  Gravid--fundal height appears AGA, Soft, NT   Musculoskeletal: Normal range of motion.  Neurological: She is  alert and oriented to person, place, and time.  Skin: Skin is warm and dry.  Psychiatric: She has a normal mood and affect. Her behavior is normal.    MAU Course  Procedures Results for orders placed or performed during the hospital encounter of 04/27/18 (from the past 24 hour(s))  CBC with Differential     Status: Abnormal   Collection Time: 04/27/18  4:11 PM  Result Value Ref Range   WBC 10.0 4.0 - 10.5 K/uL   RBC 4.02 3.87 - 5.11 MIL/uL   Hemoglobin 8.6 (L) 12.0 - 15.0 g/dL   HCT 40.930.3 (L) 81.136.0 - 91.446.0 %   MCV 75.4 (L) 80.0 - 100.0 fL   MCH 21.4 (L) 26.0 - 34.0 pg   MCHC 28.4 (L) 30.0 - 36.0 g/dL   RDW 78.217.4 (H) 95.611.5 - 21.315.5 %   Platelets 241 150 - 400 K/uL   nRBC 0.4 (H) 0.0 - 0.2 %   Neutrophils Relative % 75 %   Neutro Abs 7.5 1.7 - 7.7 K/uL   Lymphocytes Relative 15 %   Lymphs Abs 1.5 0.7 - 4.0 K/uL   Monocytes Relative 9 %   Monocytes Absolute 0.9 0.1 - 1.0 K/uL   Eosinophils Relative 0 %   Eosinophils Absolute 0.0 0.0 - 0.5 K/uL   Basophils Relative 0 %   Basophils Absolute 0.0 0.0 - 0.1 K/uL   Immature Granulocytes 1 %   Abs Immature Granulocytes 0.07 0.00 - 0.07 K/uL  Comprehensive metabolic panel     Status: Abnormal   Collection Time: 04/27/18  4:11 PM  Result Value Ref Range   Sodium 134 (L) 135 - 145 mmol/L   Potassium 3.4 (L) 3.5 - 5.1 mmol/L   Chloride 104 98 - 111 mmol/L   CO2 18 (L) 22 - 32 mmol/L   Glucose, Bld 90 70 -  99 mg/dL   BUN <5 (L) 6 - 20 mg/dL   Creatinine, Ser 0.860.81 0.44 - 1.00 mg/dL   Calcium 8.9 8.9 - 57.810.3 mg/dL   Total Protein 7.0 6.5 - 8.1 g/dL   Albumin 2.9 (L) 3.5 - 5.0 g/dL   AST 19 15 - 41 U/L   ALT 12 0 - 44 U/L   Alkaline Phosphatase 68 38 - 126 U/L   Total Bilirubin 0.3 0.3 - 1.2 mg/dL   GFR calc non Af Amer >60 >60 mL/min   GFR calc Af Amer >60 >60 mL/min   Anion gap 12 5 - 15  Magnesium     Status: None   Collection Time: 04/27/18  4:11 PM  Result Value Ref Range   Magnesium 2.0 1.7 - 2.4 mg/dL  Urinalysis, Routine w reflex microscopic     Status: Abnormal   Collection Time: 04/27/18  4:12 PM  Result Value Ref Range   Color, Urine YELLOW YELLOW   APPearance HAZY (A) CLEAR   Specific Gravity, Urine 1.016 1.005 - 1.030   pH 7.0 5.0 - 8.0   Glucose, UA NEGATIVE NEGATIVE mg/dL   Hgb urine dipstick NEGATIVE NEGATIVE   Bilirubin Urine NEGATIVE NEGATIVE   Ketones, ur NEGATIVE NEGATIVE mg/dL   Protein, ur NEGATIVE NEGATIVE mg/dL   Nitrite NEGATIVE NEGATIVE   Leukocytes,Ua NEGATIVE NEGATIVE  Influenza panel by PCR (type A & B)     Status: Abnormal   Collection Time: 04/27/18  4:22 PM  Result Value Ref Range   Influenza A By PCR POSITIVE (A) NEGATIVE   Influenza B By PCR NEGATIVE NEGATIVE  MDM Give LR Bolus Antiemetic PPI Tamiflu Regular diet EFM-Continuous  Assessment and Plan  23 year old [id number] at 25.1 weeks Cat I FT S/P Fall  -Exam findings discussed. -Start LR fluids -Give Protonix for c/o heartburn -Delsym DM for cough  -Start Tamiflu now -Give Reglan 5mg  IV now -Okay for regular diet once antiemetic complete -Reassuring FT -Will continue to monitor  Follow Up (8:12 PM)  -Placed back on monitor -Report given to M. Mayford Knife, CNM  Cherre Robins MSN, CNM 04/27/2018, 5:58 PM   Assumed care Fetal heart rate pattern has remained reassuring with no preterm contractions Coughing improved in patient Discussed discharge home Will Rx Mucinex and  Tussionex for symptoms See above for other disposition Encouraged to return here or to other Urgent Care/ED if she develops worsening of symptoms, increase in pain, fever, or other concerning symptoms.    Aviva Signs, CNM

## 2018-04-27 NOTE — MAU Note (Signed)
Pt arrived from [facility] after being seen for a fall.   Denies vaginal bleeding or LOF.

## 2018-05-03 ENCOUNTER — Encounter: Payer: Self-pay | Admitting: Emergency Medicine

## 2018-05-03 ENCOUNTER — Other Ambulatory Visit: Payer: Self-pay

## 2018-05-03 ENCOUNTER — Emergency Department
Admission: EM | Admit: 2018-05-03 | Discharge: 2018-05-04 | Disposition: A | Discharge status: Other Acute Inpatient Hospital | Payer: Medicaid Other | Attending: Emergency Medicine | Admitting: Emergency Medicine

## 2018-05-03 DIAGNOSIS — Z880 Allergy status to penicillin: Secondary | ICD-10-CM | POA: Diagnosis not present

## 2018-05-03 DIAGNOSIS — F603 Borderline personality disorder: Secondary | ICD-10-CM | POA: Diagnosis not present

## 2018-05-03 DIAGNOSIS — F32A Depression, unspecified: Secondary | ICD-10-CM

## 2018-05-03 DIAGNOSIS — Z9119 Patient's noncompliance with other medical treatment and regimen: Secondary | ICD-10-CM | POA: Insufficient documentation

## 2018-05-03 DIAGNOSIS — Z87891 Personal history of nicotine dependence: Secondary | ICD-10-CM | POA: Diagnosis not present

## 2018-05-03 DIAGNOSIS — R4589 Other symptoms and signs involving emotional state: Secondary | ICD-10-CM

## 2018-05-03 DIAGNOSIS — K5900 Constipation, unspecified: Secondary | ICD-10-CM | POA: Diagnosis present

## 2018-05-03 DIAGNOSIS — O99322 Drug use complicating pregnancy, second trimester: Secondary | ICD-10-CM | POA: Insufficient documentation

## 2018-05-03 DIAGNOSIS — R440 Auditory hallucinations: Secondary | ICD-10-CM

## 2018-05-03 DIAGNOSIS — F419 Anxiety disorder, unspecified: Secondary | ICD-10-CM | POA: Diagnosis present

## 2018-05-03 DIAGNOSIS — O99342 Other mental disorders complicating pregnancy, second trimester: Secondary | ICD-10-CM | POA: Diagnosis not present

## 2018-05-03 DIAGNOSIS — F329 Major depressive disorder, single episode, unspecified: Secondary | ICD-10-CM

## 2018-05-03 DIAGNOSIS — Z3A25 25 weeks gestation of pregnancy: Secondary | ICD-10-CM | POA: Diagnosis not present

## 2018-05-03 DIAGNOSIS — O99012 Anemia complicating pregnancy, second trimester: Secondary | ICD-10-CM | POA: Diagnosis not present

## 2018-05-03 DIAGNOSIS — F1210 Cannabis abuse, uncomplicated: Secondary | ICD-10-CM | POA: Diagnosis not present

## 2018-05-03 DIAGNOSIS — Z008 Encounter for other general examination: Secondary | ICD-10-CM | POA: Diagnosis present

## 2018-05-03 DIAGNOSIS — F3181 Bipolar II disorder: Secondary | ICD-10-CM | POA: Diagnosis present

## 2018-05-03 DIAGNOSIS — F489 Nonpsychotic mental disorder, unspecified: Secondary | ICD-10-CM

## 2018-05-03 DIAGNOSIS — R45851 Suicidal ideations: Secondary | ICD-10-CM

## 2018-05-03 DIAGNOSIS — J45909 Unspecified asthma, uncomplicated: Secondary | ICD-10-CM | POA: Diagnosis not present

## 2018-05-03 DIAGNOSIS — F333 Major depressive disorder, recurrent, severe with psychotic symptoms: Secondary | ICD-10-CM | POA: Diagnosis present

## 2018-05-03 DIAGNOSIS — O99512 Diseases of the respiratory system complicating pregnancy, second trimester: Secondary | ICD-10-CM | POA: Insufficient documentation

## 2018-05-03 DIAGNOSIS — O99612 Diseases of the digestive system complicating pregnancy, second trimester: Secondary | ICD-10-CM

## 2018-05-03 DIAGNOSIS — R441 Visual hallucinations: Secondary | ICD-10-CM

## 2018-05-03 DIAGNOSIS — F4310 Post-traumatic stress disorder, unspecified: Secondary | ICD-10-CM | POA: Diagnosis present

## 2018-05-03 DIAGNOSIS — F209 Schizophrenia, unspecified: Secondary | ICD-10-CM | POA: Insufficient documentation

## 2018-05-03 DIAGNOSIS — F411 Generalized anxiety disorder: Secondary | ICD-10-CM | POA: Diagnosis present

## 2018-05-03 DIAGNOSIS — F1290 Cannabis use, unspecified, uncomplicated: Secondary | ICD-10-CM

## 2018-05-03 DIAGNOSIS — F1220 Cannabis dependence, uncomplicated: Secondary | ICD-10-CM | POA: Diagnosis present

## 2018-05-03 DIAGNOSIS — F17200 Nicotine dependence, unspecified, uncomplicated: Secondary | ICD-10-CM | POA: Diagnosis present

## 2018-05-03 DIAGNOSIS — D649 Anemia, unspecified: Secondary | ICD-10-CM

## 2018-05-03 LAB — URINALYSIS, COMPLETE (UACMP) WITH MICROSCOPIC
BILIRUBIN URINE: NEGATIVE
Bacteria, UA: NONE SEEN
Glucose, UA: NEGATIVE mg/dL
Hgb urine dipstick: NEGATIVE
Ketones, ur: NEGATIVE mg/dL
Nitrite: NEGATIVE
Protein, ur: NEGATIVE mg/dL
Specific Gravity, Urine: 1.021 (ref 1.005–1.030)
pH: 6 (ref 5.0–8.0)

## 2018-05-03 LAB — CBC WITH DIFFERENTIAL/PLATELET
Abs Immature Granulocytes: 0.05 10*3/uL (ref 0.00–0.07)
Basophils Absolute: 0 10*3/uL (ref 0.0–0.1)
Basophils Relative: 0 %
Eosinophils Absolute: 0.1 10*3/uL (ref 0.0–0.5)
Eosinophils Relative: 1 %
HCT: 28.3 % — ABNORMAL LOW (ref 36.0–46.0)
Hemoglobin: 8.4 g/dL — ABNORMAL LOW (ref 12.0–15.0)
Immature Granulocytes: 1 %
LYMPHS PCT: 41 %
Lymphs Abs: 3.3 10*3/uL (ref 0.7–4.0)
MCH: 22 pg — ABNORMAL LOW (ref 26.0–34.0)
MCHC: 29.7 g/dL — ABNORMAL LOW (ref 30.0–36.0)
MCV: 74.3 fL — ABNORMAL LOW (ref 80.0–100.0)
MONO ABS: 0.6 10*3/uL (ref 0.1–1.0)
Monocytes Relative: 8 %
Neutro Abs: 4 10*3/uL (ref 1.7–7.7)
Neutrophils Relative %: 49 %
Platelets: 278 10*3/uL (ref 150–400)
RBC: 3.81 MIL/uL — AB (ref 3.87–5.11)
RDW: 17.4 % — ABNORMAL HIGH (ref 11.5–15.5)
WBC: 8.1 10*3/uL (ref 4.0–10.5)
nRBC: 0 % (ref 0.0–0.2)

## 2018-05-03 LAB — COMPREHENSIVE METABOLIC PANEL
ALT: 13 U/L (ref 0–44)
AST: 25 U/L (ref 15–41)
Albumin: 3.1 g/dL — ABNORMAL LOW (ref 3.5–5.0)
Alkaline Phosphatase: 69 U/L (ref 38–126)
Anion gap: 10 (ref 5–15)
BUN: 6 mg/dL (ref 6–20)
CO2: 20 mmol/L — ABNORMAL LOW (ref 22–32)
Calcium: 8.6 mg/dL — ABNORMAL LOW (ref 8.9–10.3)
Chloride: 106 mmol/L (ref 98–111)
Creatinine, Ser: 0.67 mg/dL (ref 0.44–1.00)
GFR calc Af Amer: >60 mL/min (ref >60)
GFR calc non Af Amer: >60 mL/min (ref >60)
Glucose, Bld: 90 mg/dL (ref 70–99)
POTASSIUM: 3.3 mmol/L — AB (ref 3.5–5.1)
SODIUM: 136 mmol/L (ref 135–145)
Total Bilirubin: 0.4 mg/dL (ref 0.3–1.2)
Total Protein: 7.3 g/dL (ref 6.5–8.1)

## 2018-05-03 LAB — URINE DRUG SCREEN, QUALITATIVE (ARMC ONLY)
Amphetamines, Ur Screen: NOT DETECTED
Barbiturates, Ur Screen: NOT DETECTED
Benzodiazepine, Ur Scrn: NOT DETECTED
Cannabinoid 50 Ng, Ur ~~LOC~~: POSITIVE — AB
Cocaine Metabolite,Ur ~~LOC~~: NOT DETECTED
MDMA (Ecstasy)Ur Screen: NOT DETECTED
Methadone Scn, Ur: NOT DETECTED
OPIATE, UR SCREEN: NOT DETECTED
Phencyclidine (PCP) Ur S: NOT DETECTED
Tricyclic, Ur Screen: NOT DETECTED

## 2018-05-03 LAB — PREGNANCY, URINE: Preg Test, Ur: POSITIVE — AB

## 2018-05-03 LAB — ETHANOL: Alcohol, Ethyl (B): <10 mg/dL (ref <10)

## 2018-05-03 MED ORDER — ALBUTEROL SULFATE (2.5 MG/3ML) 0.083% IN NEBU: 3.0000 mL | INHALATION_SOLUTION | RESPIRATORY_TRACT | Status: DC | PRN

## 2018-05-03 NOTE — ED Triage Notes (Addendum)
Pt arrives voluntarily with desires to seek behavioral evaluation in order to receive prescription for psychiatric medications. Pt denies SI/HI in triage.  PT changed into burgundy scrubs by this RN and tech Vernona Rieger. Pt's belongings placed into belonging bag. Contents include: jeans, pair of sneakers, gray bra, pink shirt, gray hoodie.

## 2018-05-03 NOTE — ED Provider Notes (Signed)
[facility] Emergency Department Provider Note  ____________________________________________  Time seen: Approximately 7:53 PM  I have reviewed the triage vital signs and the nursing notes.   HISTORY  Chief Complaint Behavioral Evaluation    HPI Kristy Reid is a 23 y.o. 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  The patient reports that she recently moved from [place] to be with her son's father.  She has been having worsening depression, and worsening auditory and visual hallucinations.  Yesterday, CPS removed her 24-year-old son from the home because she stated "that I was going to drink an entire bottle of liquor."  She has been wanting to kill herself by over drinking alcohol.  She also reports that she wanted to "whoop my baby daddy" and although she is not having homicidal ideations she does wish to injure him.  She has had normal fetal movement and denies any vaginal bleeding, gush of fluid, or change in vaginal discharge.  She reports she was told she has preeclampsia 1 month ago and this pregnancy when she was in [place].  I have reviewed the patient's chart and on her prior visit for influenza, she did not have hypertension or any mention of preeclampsia.  She has been having cough and cold symptoms and was seen 04/27/2018 and diagnosed with influenza.  She has completed her Tamiflu, and feels that her symptoms are improving.  Past Medical History:  Diagnosis Date  . Anemia   . Anemia   . Anxiety   . Asthma   . Bipolar 1 disorder (HCC)   . Chronic back pain   . Depression   . Insomnia   . Personality disorder (HCC)    "boarderline"  . PTSD (post-traumatic stress disorder)     Patient Active Problem List    Diagnosis Date Noted  . Anxiety disorder   . Bipolar 2 disorder (HCC) 11/11/2016  . Noncompliance 11/11/2016  . Severe recurrent major depression with psychotic features (HCC) 07/18/2016  . Depression, major, recurrent, severe with psychosis (HCC) 07/17/2016  . Normal labor 05/07/2016  . Labor and delivery, indication for care 04/19/2016  . Indication for care in labor and delivery, antepartum 04/06/2016  . Decreased fetal movement 03/22/2016  . Abdominal pain in pregnancy 01/21/2016  . Constipation during pregnancy in second trimester 12/26/2015  . Overdose 11/29/2015  . Pregnancy (I trimester) 11/15/2015  . Cannabis use disorder, severe, dependence (HCC) 11/15/2015  . Tobacco use disorder 11/15/2015  . Asthma 11/15/2015  . Borderline personality disorder (HCC) 11/15/2015  . PTSD (post-traumatic stress disorder) 11/15/2015  . Severe recurrent major depression without psychotic features (HCC) 11/13/2015    Past Surgical History:  Procedure Laterality Date  . BUNIONECTOMY Bilateral    feet  . wrist sugery      Current Outpatient Rx  . Order #: [id number] Class: Normal  . Order #: [id number] Class: Print  . Order #: [id number] Class: Print  . Order #: [id number] Class: Print  . Order #: [id number] Class: Print  . Order #: [id number] Class: Normal  . Order #: [id number] Class: Print  . Order #: [id number] Class: Print  . Order #: [id number] Class: Print  . Order #: [id number] Class: Print  . Order #: [id number] Class: Print  . Order #: [id number] Class: Print  . Order #: [id number] Class: Print  . Order #: [id number] Class: Print    Allergies  Penicillins; Rocephin [ceftriaxone]; Zithromax [azithromycin]; and Lidocaine  Family History  Problem Relation Age of Onset  . Diabetes Mother   . Diabetes Maternal Grandmother   . Heart disease Maternal Grandmother   . Cancer Neg Hx   . Ovarian cancer Neg Hx     Social History Social History   Tobacco Use  . Smoking status: Former Smoker     Types: Cigarettes    Last attempt to quit: 08/13/2015    Years since quitting: 2.7  . Smokeless tobacco: Never Used  Substance Use Topics  . Alcohol use: No  . Drug use: Yes    Frequency: 7.0 times per week    Types: Marijuana    Review of Systems Constitutional: Positive influenza.  No current fever. Eyes: No visual changes.  No eye discharge. ENT: No sore throat.  Positive congestion and rhinorrhea. Cardiovascular: Denies chest pain. Denies palpitations. Respiratory: Denies shortness of breath.  Positive nonproductive cough. Gastrointestinal: No abdominal pain.  No nausea, no vomiting.  No diarrhea.  No constipation. Genitourinary: Negative for dysuria.  No vaginal bleeding, gush of fluid or change in vaginal discharge.  Normal fetal movement. Musculoskeletal: Negative for back pain. Skin: Negative for rash. Neurological: Negative for headaches. No focal numbness, tingling or weakness.  Psychiatric:Positive SI with plan.  Positive increasing auditory and visual hallucinations.  No HI but violent thoughts against others.  ____________________________________________   PHYSICAL EXAM:  VITAL SIGNS: ED Triage Vitals  Enc Vitals Group     BP 05/03/18 1811 127/63     Pulse Rate 05/03/18 1811 (!) 115     Resp 05/03/18 1811 18     Temp 05/03/18 1811 98.1 F (36.7 C)     Temp Source 05/03/18 1811 Oral     SpO2 05/03/18 1811 100 %     Weight 05/03/18 1822 170 lb (77.1 kg)     Height 05/03/18 1822 5\' 2"  (1.575 m)     Head Circumference --      Peak Flow --      Pain Score 05/03/18 1822 0     Pain Loc --      Pain Edu? --      Excl. in GC? --     Constitutional: Alert and oriented.  Answers questions appropriately.  Sitting comfortably in the stretcher.  Intermittent mild cough. Eyes: Conjunctivae are normal.  EOMI. No scleral icterus. Head: Atraumatic. Nose: No congestion/rhinnorhea. Mouth/Throat: Mucous membranes are moist.  Neck: No stridor.  Supple.  No  meningismus. Cardiovascular: Normal rate, regular rhythm. No murmurs, rubs or gallops.  Respiratory: Normal respiratory effort.  No accessory muscle use or retractions. Lungs CTAB.  No wheezes, rales or ronchi. Gastrointestinal: Soft, nontender, and nondistended.  Gravid to 4 cm above the umbilicus.  No guarding or rebound.  No peritoneal signs. Musculoskeletal: No LE edema.  Neurologic:  A&Ox3.  Speech is clear.  Face and smile are symmetric.  EOMI.  Moves all extremities well. Skin:  Skin is warm, dry and intact. No rash noted. Psychiatric: Patient is calm and cooperative during my examination.  She has a bizarre affect.  She actively endorses SI with a plan to drink too much alcohol, as well as wishes to injure her "baby daddy."  She also reports increasing visual and auditory hallucinations on my exam.  ____________________________________________   LABS (all labs ordered are listed, but only abnormal results are displayed)  Labs Reviewed  CBC WITH DIFFERENTIAL/PLATELET - Abnormal; Notable for the following components:  Result Value   RBC 3.81 (*)    Hemoglobin 8.4 (*)    HCT 28.3 (*)    MCV 74.3 (*)    MCH 22.0 (*)    MCHC 29.7 (*)    RDW 17.4 (*)    All other components within normal limits  COMPREHENSIVE METABOLIC PANEL - Abnormal; Notable for the following components:   Potassium 3.3 (*)    CO2 20 (*)    Calcium 8.6 (*)    Albumin 3.1 (*)    All other components within normal limits  URINE DRUG SCREEN, QUALITATIVE ([facility] ONLY) - Abnormal; Notable for the following components:   Cannabinoid 50 Ng, Ur [place] POSITIVE (*)    All other components within normal limits  PREGNANCY, URINE - Abnormal; Notable for the following components:   Preg Test, Ur POSITIVE (*)    All other components within normal limits  URINALYSIS, COMPLETE (UACMP) WITH MICROSCOPIC - Abnormal; Notable for the following components:   Color, Urine YELLOW (*)    APPearance CLEAR (*)    Leukocytes,Ua TRACE  (*)    All other components within normal limits  ETHANOL   ____________________________________________  EKG  Not indicated ____________________________________________  RADIOLOGY  No results found.  ____________________________________________   PROCEDURES  Procedure(s) performed: None  Procedures  Critical Care performed: No ____________________________________________   INITIAL IMPRESSION / ASSESSMENT AND PLAN / ED COURSE  Pertinent labs & imaging results that were available during my care of the patient were reviewed by me and considered in my medical decision making (see chart for details).  23 y.o. G4, P2 A1 approximately 25-week pregnant female with multiple chronic psychiatric illnesses not currently taking medications presenting with suicidal ideations with plan, as well as increasing auditory and visual hallucinations.    The patient reports preeclampsia diagnosed 1 month ago in the pregnancy.  Here, the patient has normal blood pressure and states she is not on any blood pressure medications.  In addition, I have reviewed her chart and she did not have hypertension at her prior visit when she was diagnosed with influenza.  We will monitor her blood pressures twice daily.  She is not spilling any protein into her urine.  The legitimacy of this diagnosis is questionable and there is no evidence of preeclampsia today.  She does have anemia, which is grossly unchanged compared to her prior.  The patient does have a recent diagnosis of influenza.  At this time, there is no evidence of hemodynamic instability.  She is maintaining normal oxygen saturations and has mild cough symptoms.  She is tolerating liquids and food normally.  There is no additional intervention needed, other than albuterol as needed for coughing spasms.  I will keep her separated from the other psychiatric patients to prevent the spread of infection.  I have encouraged her to wash her hands frequently  and thoroughly to prevent the spread of infection.  From a psychiatric standpoint, the patient will be placed under involuntary commitment given her multiple red flag symptoms.  The patient's medical chart does not indicate a prior diagnosis of schizophrenia although [place] and states that she does have this diagnosis.  Visual hallucinations are less common, but she does also have auditory hallucinations a TTS and psychiatry consult have been placed.  The patient is positive for cannabinoids. ____________________________________________  FINAL CLINICAL IMPRESSION(S) / ED DIAGNOSES  Final diagnoses:  Chronic anemia  Suicidal ideation  Marijuana use  Auditory hallucinations  Visual  hallucinations  Thoughts of violence         NEW MEDICATIONS STARTED DURING THIS VISIT:  New Prescriptions   No medications on file      Rockne Menghini, MD 05/03/18 2008

## 2018-05-03 NOTE — ED Notes (Signed)
Report to include Situation, Background, Assessment, and Recommendations received from Amy RN. Patient alert and oriented, warm and dry, in no acute distress. Patient denies SI, HI, AVH and pain. Patient said " I am here because CPS took my child and told me to get Psych eval. I am voluntarily committing myself so I can get back on my medications." Patient made aware of Q15 minute rounds and Rover and Officer presence for their safety. Patient instructed to come to me with needs or concerns.

## 2018-05-04 ENCOUNTER — Encounter: Payer: Self-pay | Admitting: Obstetrics & Gynecology

## 2018-05-04 ENCOUNTER — Inpatient Hospital Stay
Admission: AD | Admit: 2018-05-04 | Discharge: 2018-05-06 | DRG: 885 | Disposition: A | Discharge status: Home / Self Care | Payer: Medicaid Other | Attending: Psychiatry | Admitting: Psychiatry

## 2018-05-04 ENCOUNTER — Other Ambulatory Visit: Payer: Self-pay

## 2018-05-04 DIAGNOSIS — K219 Gastro-esophageal reflux disease without esophagitis: Secondary | ICD-10-CM | POA: Diagnosis present

## 2018-05-04 DIAGNOSIS — Z880 Allergy status to penicillin: Secondary | ICD-10-CM | POA: Diagnosis not present

## 2018-05-04 DIAGNOSIS — R45851 Suicidal ideations: Secondary | ICD-10-CM | POA: Diagnosis present

## 2018-05-04 DIAGNOSIS — Z915 Personal history of self-harm: Secondary | ICD-10-CM | POA: Diagnosis not present

## 2018-05-04 DIAGNOSIS — Z598 Other problems related to housing and economic circumstances: Secondary | ICD-10-CM | POA: Diagnosis not present

## 2018-05-04 DIAGNOSIS — Z331 Pregnant state, incidental: Secondary | ICD-10-CM | POA: Diagnosis present

## 2018-05-04 DIAGNOSIS — Z9114 Patient's other noncompliance with medication regimen: Secondary | ICD-10-CM

## 2018-05-04 DIAGNOSIS — F17210 Nicotine dependence, cigarettes, uncomplicated: Secondary | ICD-10-CM | POA: Diagnosis present

## 2018-05-04 DIAGNOSIS — F4310 Post-traumatic stress disorder, unspecified: Secondary | ICD-10-CM | POA: Diagnosis present

## 2018-05-04 DIAGNOSIS — F1220 Cannabis dependence, uncomplicated: Secondary | ICD-10-CM | POA: Diagnosis present

## 2018-05-04 DIAGNOSIS — O99342 Other mental disorders complicating pregnancy, second trimester: Secondary | ICD-10-CM | POA: Diagnosis not present

## 2018-05-04 DIAGNOSIS — Z888 Allergy status to other drugs, medicaments and biological substances status: Secondary | ICD-10-CM

## 2018-05-04 DIAGNOSIS — Z881 Allergy status to other antibiotic agents status: Secondary | ICD-10-CM | POA: Diagnosis not present

## 2018-05-04 DIAGNOSIS — F333 Major depressive disorder, recurrent, severe with psychotic symptoms: Secondary | ICD-10-CM | POA: Diagnosis present

## 2018-05-04 DIAGNOSIS — J45909 Unspecified asthma, uncomplicated: Secondary | ICD-10-CM | POA: Diagnosis present

## 2018-05-04 DIAGNOSIS — F250 Schizoaffective disorder, bipolar type: Secondary | ICD-10-CM | POA: Diagnosis present

## 2018-05-04 DIAGNOSIS — O0990 Supervision of high risk pregnancy, unspecified, unspecified trimester: Secondary | ICD-10-CM

## 2018-05-04 DIAGNOSIS — F1010 Alcohol abuse, uncomplicated: Secondary | ICD-10-CM

## 2018-05-04 DIAGNOSIS — F329 Major depressive disorder, single episode, unspecified: Secondary | ICD-10-CM

## 2018-05-04 DIAGNOSIS — F603 Borderline personality disorder: Secondary | ICD-10-CM | POA: Diagnosis present

## 2018-05-04 MED ORDER — FLUTICASONE PROPIONATE 50 MCG/ACT NA SUSP
1.0000 | Freq: Two times a day (BID) | NASAL | Status: DC
  Administered 2018-05-04 – 2018-05-06 (×4): 1 via NASAL
  Filled 2018-05-04: qty 16

## 2018-05-04 MED ORDER — CYCLOBENZAPRINE HCL 10 MG PO TABS: 10.0000 mg | ORAL_TABLET | Freq: Three times a day (TID) | ORAL | Status: DC | PRN

## 2018-05-04 MED ORDER — CITALOPRAM HYDROBROMIDE 20 MG PO TABS
10.0000 mg | ORAL_TABLET | Freq: Every day | ORAL | Status: DC
  Administered 2018-05-05 – 2018-05-06 (×2): 10 mg via ORAL
  Filled 2018-05-04 (×2): qty 1

## 2018-05-04 MED ORDER — FERROUS SULFATE 325 (65 FE) MG PO TABS
325.0000 mg | ORAL_TABLET | Freq: Every day | ORAL | Status: DC
  Filled 2018-05-04 (×2): qty 1

## 2018-05-04 MED ORDER — ACETAMINOPHEN 325 MG PO TABS: 650.0000 mg | ORAL_TABLET | Freq: Four times a day (QID) | ORAL | Status: DC | PRN

## 2018-05-04 MED ORDER — QUETIAPINE FUMARATE 200 MG PO TABS
300.0000 mg | ORAL_TABLET | Freq: Every day | ORAL | Status: DC
  Administered 2018-05-04 – 2018-05-05 (×2): 300 mg via ORAL
  Filled 2018-05-04 (×2): qty 1

## 2018-05-04 MED ORDER — ALBUTEROL SULFATE (2.5 MG/3ML) 0.083% IN NEBU: 3.0000 mL | INHALATION_SOLUTION | RESPIRATORY_TRACT | Status: DC | PRN

## 2018-05-04 MED ORDER — TRAZODONE HCL 50 MG PO TABS
150.0000 mg | ORAL_TABLET | Freq: Every day | ORAL | Status: DC
  Administered 2018-05-04 – 2018-05-05 (×2): 150 mg via ORAL
  Filled 2018-05-04 (×2): qty 1

## 2018-05-04 MED ORDER — PROMETHAZINE HCL 25 MG PO TABS: 25.0000 mg | ORAL_TABLET | Freq: Four times a day (QID) | ORAL | Status: DC | PRN

## 2018-05-04 NOTE — ED Notes (Signed)
Patient is alert and oriented, voices understanding of transfer/admission to BMU, all belongings went with Patient, Patient transferred to room 23, escorted with police and tech.

## 2018-05-04 NOTE — ED Notes (Signed)
Hourly rounding reveals patient in room. No complaints, stable, in no acute distress. Q15 minute rounds and monitoring via Rover and Officer to continue.   

## 2018-05-04 NOTE — ED Notes (Signed)
Patient is on the phone, exhibits no signs of distress.

## 2018-05-04 NOTE — H&P (Signed)
Psychiatric Admission Assessment Adult  Patient Identification: Kristy Reid MRN:  [id number] Date of Evaluation:  05/04/2018 Chief Complaint:  MDD severe with psychosis Principal Diagnosis: Schizoaffective disorder, bipolar type (HCC) Diagnosis:  Principal Problem:   Schizoaffective disorder, bipolar type (HCC) Active Problems:   Pregnancy (I trimester)   Cannabis use disorder, severe, dependence (HCC)   Borderline personality disorder (HCC)   PTSD (post-traumatic stress disorder)   MDD (major depressive disorder), recurrent, severe, with psychosis (HCC)   Alcohol abuse  History of Present Illness: This is a patient with a history of chronic mood instability history of depression and some psychotic symptoms possibly bipolar disorder.  Came to the emergency room because she has been off her medicine for several months now.  She feels like she is getting more angry.  Having mood swings.  Told DSS that she wanted to drink an entire bottle of liquor.  Patient is currently pregnant.  She denies any suicidal intent but admits to having hopeless suicidal thoughts at times. Associated Signs/Symptoms: Depression Symptoms:  depressed mood, suicidal thoughts without plan, (Hypo) Manic Symptoms:  Distractibility, Impulsivity, Irritable Mood, Anxiety Symptoms:  Excessive Worry, Psychotic Symptoms:  Paranoia, PTSD Symptoms: Negative Total Time spent with patient: 1 hour  Past Psychiatric History: Patient has a history of prior psychiatric treatment prior psychiatric medicine.  Denies that she is ever actually tried to kill herself in the past.  Is the patient at risk to self? Yes.    Has the patient been a risk to self in the past 6 months? Yes.    Has the patient been a risk to self within the distant past? No.  Is the patient a risk to others? No.  Has the patient been a risk to others in the past 6 months? No.  Has the patient been a risk to others within the distant past? No.    Prior Inpatient Therapy:   Prior Outpatient Therapy:    Alcohol Screening: 1. How often do you have a drink containing alcohol?: Never 2. How many drinks containing alcohol do you have on a typical day when you are drinking?: 1 or 2 3. How often do you have six or more drinks on one occasion?: Never AUDIT-C Score: 0 4. How often during the last year have you found that you were not able to stop drinking once you had started?: Never 5. How often during the last year have you failed to do what was normally expected from you becasue of drinking?: Never 6. How often during the last year have you needed a first drink in the morning to get yourself going after a heavy drinking session?: Never 7. How often during the last year have you had a feeling of guilt of remorse after drinking?: Never 8. How often during the last year have you been unable to remember what happened the night before because you had been drinking?: Never 9. Have you or someone else been injured as a result of your drinking?: No 10. Has a relative or friend or a doctor or another health worker been concerned about your drinking or suggested you cut down?: No Alcohol Use Disorder Identification Test Final Score (AUDIT): 0 Alcohol Brief Interventions/Follow-up: AUDIT Score <7 follow-up not indicated, Continued Monitoring Substance Abuse History in the last 12 months:  Yes.   Consequences of Substance Abuse: Legal Consequences:  Please into the problems she has with child custody Previous Psychotropic Medications: Yes  Psychological Evaluations: Yes  Past Medical History:  Past  Medical History:  Diagnosis Date  . Anemia   . Anemia   . Anxiety   . Asthma   . Bipolar 1 disorder (HCC)   . Chronic back pain   . Depression   . Insomnia   . Personality disorder (HCC)    "boarderline"  . PTSD (post-traumatic stress disorder)     Past Surgical History:  Procedure Laterality Date  . BUNIONECTOMY Bilateral    feet  . wrist  sugery     Family History:  Family History  Problem Relation Age of Onset  . Diabetes Mother   . Diabetes Maternal Grandmother   . Heart disease Maternal Grandmother   . Cancer Neg Hx   . Ovarian cancer Neg Hx    Family Psychiatric  History: Denies any Tobacco Screening: Have you used any form of tobacco in the last 30 days? (Cigarettes, Smokeless Tobacco, Cigars, and/or Pipes): Yes Tobacco use, Select all that apply: 5 or more cigarettes per day Are you interested in Tobacco Cessation Medications?: Yes, will notify MD for an order Counseled patient on smoking cessation including recognizing danger situations, developing coping skills and basic information about quitting provided: Refused/Declined practical counseling Social History:  Social History   Substance and Sexual Activity  Alcohol Use No     Social History   Substance and Sexual Activity  Drug Use Yes  . Frequency: 7.0 times per week  . Types: Marijuana    Additional Social History:                           Allergies:   Allergies  Allergen Reactions  . Penicillins Hives    Has patient had a PCN reaction causing immediate rash, facial/tongue/throat swelling, SOB or lightheadedness with hypotension: No Has patient had a PCN reaction causing severe rash involving mucus membranes or skin necrosis: No Has patient had a PCN reaction that required hospitalization No Has patient had a PCN reaction occurring within the last 10 years: No If all of the above answers are "NO", then may proceed with Cephalosporin use.  . Rocephin [Ceftriaxone] Hives  . Zithromax [Azithromycin] Itching and Nausea And Vomiting  . Lidocaine Hives, Itching and Rash   Lab Results:  Results for orders placed or performed during the hospital encounter of 05/03/18 (from the past 48 hour(s))  Ethanol     Status: None   Collection Time: 05/03/18  6:19 PM  Result Value Ref Range   Alcohol, Ethyl (B) <10 <10 mg/dL    Comment:  (NOTE) Lowest detectable limit for serum alcohol is 10 mg/dL. For medical purposes only. Performed at [facility], [address] [place], [place] [address]   CBC with Differential     Status: Abnormal   Collection Time: 05/03/18  6:19 PM  Result Value Ref Range   WBC 8.1 4.0 - 10.5 K/uL   RBC 3.81 (L) 3.87 - 5.11 MIL/uL   Hemoglobin 8.4 (L) 12.0 - 15.0 g/dL    Comment: Reticulocyte Hemoglobin testing may be clinically indicated, consider ordering this additional test [id number]    HCT 28.3 (L) 36.0 - 46.0 %   MCV 74.3 (L) 80.0 - 100.0 fL   MCH 22.0 (L) 26.0 - 34.0 pg   MCHC 29.7 (L) 30.0 - 36.0 g/dL   RDW 29.2 (H) 44.6 - 28.6 %   Platelets 278 150 - 400 K/uL   nRBC 0.0 0.0 - 0.2 %   Neutrophils Relative % 49 %  Neutro Abs 4.0 1.7 - 7.7 K/uL   Lymphocytes Relative 41 %   Lymphs Abs 3.3 0.7 - 4.0 K/uL   Monocytes Relative 8 %   Monocytes Absolute 0.6 0.1 - 1.0 K/uL   Eosinophils Relative 1 %   Eosinophils Absolute 0.1 0.0 - 0.5 K/uL   Basophils Relative 0 %   Basophils Absolute 0.0 0.0 - 0.1 K/uL   Immature Granulocytes 1 %   Abs Immature Granulocytes 0.05 0.00 - 0.07 K/uL    Comment: Performed at [facility], [address] [place], [place] [address]  Comprehensive metabolic panel     Status: Abnormal   Collection Time: 05/03/18  6:19 PM  Result Value Ref Range   Sodium 136 135 - 145 mmol/L   Potassium 3.3 (L) 3.5 - 5.1 mmol/L   Chloride 106 98 - 111 mmol/L   CO2 20 (L) 22 - 32 mmol/L   Glucose, Bld 90 70 - 99 mg/dL   BUN 6 6 - 20 mg/dL   Creatinine, Ser 0.98 0.44 - 1.00 mg/dL   Calcium 8.6 (L) 8.9 - 10.3 mg/dL   Total Protein 7.3 6.5 - 8.1 g/dL   Albumin 3.1 (L) 3.5 - 5.0 g/dL   AST 25 15 - 41 U/L   ALT 13 0 - 44 U/L   Alkaline Phosphatase 69 38 - 126 U/L   Total Bilirubin 0.4 0.3 - 1.2 mg/dL   GFR calc non Af Amer >60 >60 mL/min   GFR calc Af Amer >60 >60 mL/min   Anion gap 10 5 - 15    Comment: Performed at [facility], [address] [place], [place] [address]  Urine Drug Screen, Qualitative ([facility] only)     Status: Abnormal   Collection Time: 05/03/18  6:20 PM  Result Value Ref Range   Tricyclic, Ur Screen NONE DETECTED NONE DETECTED   Amphetamines, Ur Screen NONE DETECTED NONE DETECTED   MDMA (Ecstasy)Ur Screen NONE DETECTED NONE DETECTED   Cocaine Metabolite,Ur [place] NONE DETECTED NONE DETECTED   Opiate, Ur Screen NONE DETECTED NONE DETECTED   Phencyclidine (PCP) Ur S NONE DETECTED NONE DETECTED   Cannabinoid 50 Ng, Ur [place] POSITIVE (A) NONE DETECTED   Barbiturates, Ur Screen NONE DETECTED NONE DETECTED   Benzodiazepine, Ur Scrn NONE DETECTED NONE DETECTED   Methadone Scn, Ur NONE DETECTED NONE DETECTED    Comment: (NOTE) Tricyclics + metabolites, urine    Cutoff 1000 ng/mL Amphetamines + metabolites, urine  Cutoff 1000 ng/mL MDMA (Ecstasy), urine              Cutoff 500 ng/mL Cocaine Metabolite, urine          Cutoff 300 ng/mL Opiate + metabolites, urine        Cutoff 300 ng/mL Phencyclidine (PCP), urine         Cutoff 25 ng/mL Cannabinoid, urine                 Cutoff 50 ng/mL Barbiturates + metabolites, urine  Cutoff 200 ng/mL Benzodiazepine, urine              Cutoff 200 ng/mL Methadone, urine                   Cutoff 300 ng/mL The urine drug screen provides only a preliminary, unconfirmed analytical test result and should not be used for non-medical purposes. Clinical consideration and professional judgment should be applied to any positive drug screen result due to possible interfering substances.  A more specific alternate chemical method must be used in order to obtain a confirmed analytical result. Gas chromatography / mass spectrometry (GC/MS) is the preferred confirmat ory method. Performed at [facility], [address] [place], [place] [address]   Pregnancy, urine     Status: Abnormal   Collection Time: 05/03/18  6:20 PM  Result Value Ref Range   Preg Test, Ur POSITIVE (A)  NEGATIVE    Comment: Performed at [facility], [address] [place], [place] [address]  Urinalysis, Complete w Microscopic     Status: Abnormal   Collection Time: 05/03/18  6:20 PM  Result Value Ref Range   Color, Urine YELLOW (A) YELLOW   APPearance CLEAR (A) CLEAR   Specific Gravity, Urine 1.021 1.005 - 1.030   pH 6.0 5.0 - 8.0   Glucose, UA NEGATIVE NEGATIVE mg/dL   Hgb urine dipstick NEGATIVE NEGATIVE   Bilirubin Urine NEGATIVE NEGATIVE   Ketones, ur NEGATIVE NEGATIVE mg/dL   Protein, ur NEGATIVE NEGATIVE mg/dL   Nitrite NEGATIVE NEGATIVE   Leukocytes,Ua TRACE (A) NEGATIVE   RBC / HPF 0-5 0 - 5 RBC/hpf   WBC, UA 0-5 0 - 5 WBC/hpf   Bacteria, UA NONE SEEN NONE SEEN   Squamous Epithelial / LPF 6-10 0 - 5   Mucus PRESENT    Hyaline Casts, UA PRESENT     Comment: Performed at [facility], [address] [place], [place] [address]    Blood Alcohol level:  Lab Results  Component Value Date   [facility] 05/[facility]2018   ETH <10 09/21/2017    Metabolic Disorder Labs:  Lab Results  Component Value Date   HGBA1C 5.3 09/21/2017   MPG 105 09/21/2017   MPG 103 07/19/2016   Lab Results  Component Value Date   PROLACTIN 9.2 07/19/2016   PROLACTIN 67.6 (H) 11/15/2015   Lab Results  Component Value Date   CHOL 153 09/21/2017   TRIG 74 09/21/2017   HDL 46 09/21/2017   CHOLHDL 3.3 09/21/2017   VLDL 15 09/21/2017   LDLCALC 92 09/21/2017   LDLCALC 122 (H) 07/19/2016    Current Medications: Current Facility-Administered Medications  Medication Dose Route Frequency Provider Last Rate Last Dose  . acetaminophen (TYLENOL) tablet 650 mg  650 mg Oral Q6H PRN Mariel CraftMaurer, Sheila M, MD      . albuterol (PROVENTIL) (2.5 MG/3ML) 0.083% nebulizer solution 3 mL  3 mL Inhalation Q4H PRN Mariel CraftMaurer, Sheila M, MD      . Melene Muller[START ON 05/05/2018] ferrous sulfate tablet 325 mg  325 mg Oral Q breakfast Mariel CraftMaurer, Sheila M, MD      . fluticasone [facility](FLONASE) 50 MCG/ACT nasal spray 1 spray  1  spray Each Nare BID Mariel CraftMaurer, Sheila M, MD   1 spray at 05/04/18 1710  . promethazine (PHENERGAN) tablet 25 mg  25 mg Oral Q6H PRN Mariel CraftMaurer, Sheila M, MD       PTA Medications: Medications Prior to Admission  Medication Sig Dispense Refill Last Dose  . chlorpheniramine-HYDROcodone (TUSSIONEX PENNKINETIC ER) 10-8 MG/5ML SUER Take 5 mLs by mouth every 12 (twelve) hours as needed for cough. 140 mL 0 Past Week at Unknown time  . citalopram (CELEXA) 10 MG tablet Take 1 tablet (10 mg total) by mouth at bedtime. (Patient not taking: Reported on 04/04/2018) 30 tablet 0 Not Taking at Unknown time  . cyclobenzaprine (FLEXERIL) 10 MG tablet Take 1 tablet (10 mg total) by mouth 3 (three) times daily as needed. (  Patient not taking: Reported on 04/04/2018) 15 tablet 0 Not Taking at Unknown time  . ferrous sulfate 325 (65 FE) MG tablet Take 1 tablet (325 mg total) by mouth daily with breakfast. (Patient not taking: Reported on 04/04/2018) 30 tablet 0 Not Taking at Unknown time  . fluticasone (FLONASE) 50 MCG/ACT nasal spray Place 1 spray into both nostrils 2 (two) times daily. (Patient not taking: Reported on 04/04/2018) 16 g 0 Not Taking at Unknown time  . guaiFENesin (MUCINEX) 600 MG 12 hr tablet Take 1 tablet (600 mg total) by mouth 2 (two) times daily. 30 tablet 1 Past Week at Unknown time  . hydrOXYzine (ATARAX/VISTARIL) 50 MG tablet Take 1 tablet (50 mg total) by mouth 3 (three) times daily as needed for anxiety. (Patient not taking: Reported on 04/04/2018) 90 tablet 0 Not Taking at Unknown time  . loratadine (CLARITIN) 10 MG tablet Take 1 tablet (10 mg total) by mouth daily. (Patient not taking: Reported on 04/04/2018) 30 tablet 0 Not Taking at Unknown time  . Melatonin 5 MG TABS Take 1 tablet (5 mg total) by mouth at bedtime. (Patient not taking: Reported on 04/04/2018) 30 tablet 0 Not Taking at Unknown time  . predniSONE (DELTASONE) 50 MG tablet Take 1 tablet (50 mg total) by mouth daily. (Patient not taking: Reported  on 05/03/2018) 5 tablet 0 Completed Course at Unknown time  . predniSONE (DELTASONE) 50 MG tablet Take 1 tablet (50 mg total) by mouth daily. (Patient not taking: Reported on 05/03/2018) 5 tablet 0 Not Taking at Unknown time  . promethazine (PHENERGAN) 25 MG tablet Take 1 tablet (25 mg total) by mouth every 6 (six) hours as needed for nausea or vomiting. (Patient not taking: Reported on 05/03/2018) 12 tablet 0 Completed Course at Unknown time  . QUEtiapine (SEROQUEL) 300 MG tablet Take 1 tablet (300 mg total) by mouth at bedtime. (Patient not taking: Reported on 04/04/2018) 30 tablet 0 Not Taking at Unknown time  . traZODone (DESYREL) 150 MG tablet Take 1 tablet (150 mg total) by mouth at bedtime as needed for sleep. (Patient not taking: Reported on 04/04/2018) 30 tablet 0 Not Taking at Unknown time    Musculoskeletal: Strength & Muscle Tone: within normal limits Gait & Station: normal Patient leans: N/A  Psychiatric Specialty Exam: Physical Exam  Nursing note and vitals reviewed. Constitutional: She appears well-developed and well-nourished.  HENT:  Head: Normocephalic and atraumatic.  Eyes: Pupils are equal, round, and reactive to light. Conjunctivae are normal.  Neck: Normal range of motion.  Cardiovascular: Regular rhythm and normal heart sounds.  Respiratory: Effort normal. No respiratory distress.  GI: Soft.  Musculoskeletal: Normal range of motion.  Neurological: She is alert.  Skin: Skin is warm and dry.  Psychiatric: Her speech is normal and behavior is normal. Her mood appears anxious. Cognition and memory are impaired. She expresses impulsivity. She exhibits a depressed mood. She expresses suicidal ideation. She expresses no suicidal plans.    Review of Systems  Constitutional: Negative.   HENT: Negative.   Eyes: Negative.   Respiratory: Negative.   Cardiovascular: Negative.   Gastrointestinal: Negative.   Musculoskeletal: Negative.   Skin: Negative.   Neurological:  Negative.   Psychiatric/Behavioral: Positive for depression, hallucinations, substance abuse and suicidal ideas. The patient is nervous/anxious.     Blood pressure 125/73, pulse (!) 105, temperature 98.4 F (36.9 C), temperature source Oral, resp. rate 16, height 5\' 2"  (1.575 m), weight 76.2 kg, SpO2 100 %, unknown if currently breastfeeding.Body  mass index is 30.73 kg/m.  General Appearance: Casual  Eye Contact:  Fair  Speech:  Clear and Coherent  Volume:  Normal  Mood:  Euthymic  Affect:  Constricted  Thought Process:  Coherent  Orientation:  Full (Time, Place, and Person)  Thought Content:  Logical  Suicidal Thoughts:  Yes.  without intent/plan  Homicidal Thoughts:  No  Memory:  Immediate;   Fair Recent;   Fair Remote;   Fair  Judgement:  Fair  Insight:  Fair  Psychomotor Activity:  Decreased  Concentration:  Concentration: Fair  Recall:  [employer] of Knowledge:  Fair  Language:  Fair  Akathisia:  No  Handed:  Right  AIMS (if indicated):     Assets:  Desire for Improvement  ADL's:  Intact  Cognition:  WNL  Sleep:       Treatment Plan Summary: Plan Patient is agreeable to restarting Seroquel and trazodone and Celexa and melatonin.  Medicines will be restarted.  She was educated about the risks and benefits of medicine during pregnancy.  Patient has been repeatedly educated about the dangers of continued marijuana abuse during pregnancy.  She will be engaged in individual groups and therapy and then we will make plans for discharge when she is stable  Observation Level/Precautions:  15 minute checks  Laboratory:  Chemistry Profile  Psychotherapy:    Medications:    Consultations:    Discharge Concerns:    Estimated LOS:  Other:     Physician Treatment Plan for Primary Diagnosis: Schizoaffective disorder, bipolar type (HCC) Long Term Goal(s): Improvement in symptoms so as ready for discharge  Short Term Goals: Ability to disclose and discuss suicidal ideas and  Ability to demonstrate self-control will improve  Physician Treatment Plan for Secondary Diagnosis: Principal Problem:   Schizoaffective disorder, bipolar type (HCC) Active Problems:   Pregnancy (I trimester)   Cannabis use disorder, severe, dependence (HCC)   Borderline personality disorder (HCC)   PTSD (post-traumatic stress disorder)   MDD (major depressive disorder), recurrent, severe, with psychosis (HCC)   Alcohol abuse  Long Term Goal(s): Improvement in symptoms so as ready for discharge  Short Term Goals: Ability to identify and develop effective coping behaviors will improve and Ability to maintain clinical measurements within normal limits will improve  I certify that inpatient services furnished can reasonably be expected to improve the patient's condition.    Mordecai Rasmussen, MD 2/19/20206:35 PM

## 2018-05-04 NOTE — BH Assessment (Signed)
Patient is to be admitted to [facility] by Dr. Viviano Simas.  Attending Physician will be Dr. Toni Amend.   Patient has been assigned to room 323, by [facility] Charge Nurse Gwen.   Intake Paper Work has been signed and placed on patient chart.  ER staff is aware of the admission:  LouAnn, ER Secretary    Dr. Scotty Court, ER MD   Toniann Fail, Patient's Nurse   Mosie Epstein, Patient Access.

## 2018-05-04 NOTE — Progress Notes (Deleted)
   Patient did not show up today for her scheduled appointment. On chart review, she is currently hospitalized. She will follow up with our office when she is discharged; OB/GYN on call ([phone]) can be called for any inpatient obstetric concerns.   Jaynie Collins, MD, FACOG Obstetrician & Gynecologist, [facility] for [employer], [facility]

## 2018-05-04 NOTE — BHH Suicide Risk Assessment (Signed)
[facility] Admission Suicide Risk Assessment   Nursing information obtained from:  Patient Demographic factors:  Unemployed, Low socioeconomic status Current Mental Status:  Suicidal ideation indicated by patient Loss Factors:  Financial problems / change in socioeconomic status, Loss of significant relationship Historical Factors:  Prior suicide attempts Risk Reduction Factors:  Pregnancy, Living with another person, especially a relative  Total Time spent with patient: 1 hour Principal Problem: Schizoaffective depressive Diagnosis:  Active Problems:   MDD (major depressive disorder), recurrent, severe, with psychosis (HCC)  Subjective Data: Patient came to the hospital asking to be back on her medicine.  Says she has been irritable and angry recently.  Feeling somewhat paranoid and out of control.  Has been off her medicine for probably several months.  Continued Clinical Symptoms:  Alcohol Use Disorder Identification Test Final Score (AUDIT): 0 The "Alcohol Use Disorders Identification Test", Guidelines for Use in Primary Care, Second Edition.  World Science writer [facility]). Score between 0-7:  no or low risk or alcohol related problems. Score between 8-15:  moderate risk of alcohol related problems. Score between 16-19:  high risk of alcohol related problems. Score 20 or above:  warrants further diagnostic evaluation for alcohol dependence and treatment.   CLINICAL FACTORS:   Schizophrenia:   Less than 93 years old   Musculoskeletal: Strength & Muscle Tone: within normal limits Gait & Station: normal Patient leans: N/A  Psychiatric Specialty Exam: Physical Exam  Nursing note and vitals reviewed. Constitutional: She appears well-developed and well-nourished.  HENT:  Head: Normocephalic and atraumatic.  Eyes: Pupils are equal, round, and reactive to light. Conjunctivae are normal.  Neck: Normal range of motion.  Cardiovascular: Regular rhythm and normal heart sounds.   Respiratory: Effort normal. No respiratory distress.  GI: Soft.  Musculoskeletal: Normal range of motion.  Neurological: She is alert.  Skin: Skin is warm and dry.  Psychiatric: She has a normal mood and affect. Her behavior is normal. Judgment and thought content normal.    Review of Systems  Constitutional: Negative.   HENT: Negative.   Eyes: Negative.   Respiratory: Negative.   Cardiovascular: Negative.   Gastrointestinal: Negative.   Musculoskeletal: Negative.   Skin: Negative.   Neurological: Negative.   Psychiatric/Behavioral: Negative.     Blood pressure 125/73, pulse (!) 105, temperature 98.4 F (36.9 C), temperature source Oral, resp. rate 16, height 5\' 2"  (1.575 m), weight 76.2 kg, SpO2 100 %, unknown if currently breastfeeding.Body mass index is 30.73 kg/m.  General Appearance: Casual  Eye Contact:  Good  Speech:  Clear and Coherent  Volume:  Normal  Mood:  Dysphoric  Affect:  Congruent  Thought Process:  Goal Directed  Orientation:  Full (Time, Place, and Person)  Thought Content:  Logical  Suicidal Thoughts:  No  Homicidal Thoughts:  No  Memory:  Immediate;   Fair Recent;   Fair Remote;   Fair  Judgement:  Fair  Insight:  Shallow  Psychomotor Activity:  Decreased  Concentration:  Concentration: Fair  Recall:  [employer] of Knowledge:  Fair  Language:  Fair  Akathisia:  No  Handed:  Right  AIMS (if indicated):     Assets:  Desire for Improvement Housing  ADL's:  Intact  Cognition:  WNL  Sleep:         COGNITIVE FEATURES THAT CONTRIBUTE TO RISK:  Polarized thinking    SUICIDE RISK:   Minimal: No identifiable suicidal ideation.  Patients presenting with no risk factors but with morbid ruminations;  may be classified as minimal risk based on the severity of the depressive symptoms  PLAN OF CARE: Patient will be started back on psychiatric medicine and engaged in individual and group therapy.  We will work on discharge planning to get her back out  of the hospital as soon as safely possible  I certify that inpatient services furnished can reasonably be expected to improve the patient's condition.   Mordecai Rasmussen, MD 05/04/2018, 6:31 PM

## 2018-05-04 NOTE — Progress Notes (Signed)
Call placed to labor and delivery unit to notify them that the patient needs a fetal heart tones check performed. This Clinical research associate was told they will come down as soon as possible. Will continue to monitor.

## 2018-05-04 NOTE — [facility] Group Notes (Signed)
[facility] Group Notes:  (Nursing/MHT/Case Management/Adjunct)  Date:  05/04/2018  Time:  3:12 PM  Type of Therapy:  Psychoeducational Skills  Participation Level:  Active  Participation Quality:  Appropriate, Attentive, Sharing and Supportive  Affect:  Appropriate and Excited  Cognitive:  Alert and Appropriate  Insight:  Appropriate, Good and Improving  Engagement in Group:  Developing/Improving, Engaged, Improving and Supportive  Modes of Intervention:  Education  Summary of Progress/Problems:  Jeannette How 05/04/2018, 3:12 PM

## 2018-05-04 NOTE — ED Notes (Signed)
Patient arouses easily, cooperative and calm, flat affect, will continue to monitor.

## 2018-05-04 NOTE — Progress Notes (Signed)
Spoke with Barkley Bruns the specialty coordinator registered nurse on Labor and delivery. This Clinical research associate was informed that she or another staff RN will come between 05:30-6:00am to perform fetal heart tones to be in compliance with MD orders for fetal heart tones every shift. This writer will follow-up closer to scheduled time to make sure this is performed as planned.

## 2018-05-04 NOTE — ED Notes (Signed)
Snack and beverage given. 

## 2018-05-04 NOTE — [facility] Group Notes (Signed)
LCSW Group Therapy Note  05/04/2018 12:42 PM  Type of Therapy/Topic:  Group Therapy:  Emotion Regulation  Participation Level:  Active   Description of Group:   The purpose of this group is to assist patients in learning to regulate negative emotions and experience positive emotions. Patients will be guided to discuss ways in which they have been vulnerable to their negative emotions. These vulnerabilities will be juxtaposed with experiences of positive emotions or situations, and patients will be challenged to use positive emotions to combat negative ones. Special emphasis will be placed on coping with negative emotions in conflict situations, and patients will process healthy conflict resolution skills.  Therapeutic Goals: 1. Patient will identify two positive emotions or experiences to reflect on in order to balance out negative emotions 2. Patient will label two or more emotions that they find the most difficult to experience 3. Patient will demonstrate positive conflict resolution skills through discussion and/or role plays  Summary of Patient Progress: Pt was appropriate and respectful in group. Pt discussed reacting to her emotions by smoking. Pt discussed more positive coping strategy could be to sing and journal.    Therapeutic Modalities:   Cognitive Behavioral Therapy Feelings Identification Dialectical Behavioral Therapy   Iris Pert, MSW, LCSW Clinical Social Work 05/04/2018 12:42 PM

## 2018-05-04 NOTE — Plan of Care (Signed)
  Problem: Education: Goal: Knowledge of [facility] Education information/materials will improve Note:  Instructed on unit programing and  , patient  able to verbalize  understanding

## 2018-05-04 NOTE — Progress Notes (Signed)
Admission Note: Report from [facility] ER  D: Pt appeared depressed  With  a flat affect.  Pt  denies SI / AVH at this time.  23 year old female in unde the services of Dr. Toni Amend . Patient present  with  Symptoms of suicidally . Patient call CPS  Told them she was going to drink ETOH and  And take pills in front of her child . CPS took child and patient  Came to the ER. This is one of many  Admissions  To the unit .  Patient  Is currently off medication due to being  Pregnant [redacted] weeks . Patient smokes pot daily . Patient  Has another son that lives  With his father  Admission Note: Pt is redirectable and cooperative with assessment.      A: Pt admitted to unit per protocol, skin assessment and search done and no contraband found with nurse [facility] RN.  Pt  educated on therapeutic milieu rules. Pt was introduced to milieu by nursing staff.    R: Pt was receptive to education about the milieu .  15 min safety checks started. Clinical research associate offered support
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
## 2018-05-04 NOTE — Tx Team (Signed)
Initial Treatment Plan 05/04/2018 6:23 PM [name] [id number]    PATIENT STRESSORS: Financial difficulties Medication change or noncompliance Substance abuse   PATIENT STRENGTHS: Ability for insight Active sense of humor Average or above average intelligence Communication skills Supportive family/friends   PATIENT IDENTIFIED PROBLEMS: Suicidal 05/04/2018  Depression  05/04/2018  Substance Abuse  05/04/2018                 DISCHARGE CRITERIA:  Ability to meet basic life and health needs Improved stabilization in mood, thinking, and/or behavior  PRELIMINARY DISCHARGE PLAN: Outpatient therapy Return to previous living arrangement  PATIENT/FAMILY INVOLVEMENT: This treatment plan has been presented to and reviewed with the patient, Kristy Reid, and/or family member,  .  The patient and family have been given the opportunity to ask questions and make suggestions.  Crist Infante, RN 05/04/2018, 6:23 PM

## 2018-05-04 NOTE — Progress Notes (Signed)
FHT 155 from [facility]  OB/GYN

## 2018-05-04 NOTE — Consult Note (Addendum)
[facility] Face-to-Face Psychiatry Consult   Reason for Consult:  Suicidal Ideation Referring Physician:  Dr. Sharma Covert Patient Identification: Kristy Reid MRN:  [id number] Principal Diagnosis: Depression with suicidal ideation Diagnosis:  Principal Problem:   Depression with suicidal ideation   Total Time spent with patient: 1 hour  Subjective: "CPS took my son yesterday. Because of my mental illness and I am not on my medications."  Kristy Reid is a 23 y.o. female patient presented to [facility] ED due to Child Protective Services (CPS) removing her two y/o son from the home. The patient was seen face-to-face by this provider; chart reviewed and consulted with Dr. Sharma Covert on 05/03/2018. The discussion with Dr. Sharma Covert that the patient will be admitted to the inpatient unit. She can begin her psychiatric medication regime. On evaluation the patient reports that she uses marijuana everyday and several times a day. "I smoke it everyday and many times during the day." During her evaluation, the patient is alert and oriented x4, calm and cooperative, and mood is sad. The patient does not appear to be responding to internal or external stimuli. The patient does admit to delusional thinking, auditory and visual hallucinations. The patient denies any suicidal, homicidal, or self-harm ideations. The patient is not presenting with any psychotic or paranoid behaviors. During an encounter with the patient, he/she was able to answer questions appropriately.  HPI:  Per Dr. Sharma Covert; Kristy Reid is a 23 y.o. 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  The patient reports that she recently moved from [place] to be with her son's father.  She has been having worsening  depression, and worsening auditory and visual hallucinations.  Yesterday, CPS removed her 41-year-old son from the home because she stated "that I was going to drink an entire bottle of liquor."  She has been wanting to kill herself by over drinking alcohol.  She also reports that she wanted to "whoop my baby daddy" and although she is not having homicidal ideations she does wish to injure him.  She has had normal fetal movement and denies any vaginal bleeding, gush of fluid, or change in vaginal discharge.  She reports she was told she has preeclampsia 1 month ago and this pregnancy when she was in [place].  I have reviewed the patient's chart and on her prior visit for influenza, she did not have hypertension or any mention of preeclampsia.  She has been having cough and cold symptoms and was seen 04/27/2018 and diagnosed with influenza.  She has completed her Tamiflu, and feels that her symptoms are improving.  Past Psychiatric History:  Anxiety disorder Bipolar 2 disorder  Risk to Self:  yes Risk to Others:  yes Prior Inpatient Therapy:  yes Prior Outpatient Therapy:  yes  Past Medical History:  Past Medical History:  Diagnosis Date  . Anemia   . Anemia   . Anxiety   . Asthma   . Bipolar 1 disorder (HCC)   . Chronic back pain   . Depression   . Insomnia   . Personality disorder (HCC)    "boarderline"  . PTSD (post-traumatic stress disorder)     Past Surgical History:  Procedure Laterality Date  . BUNIONECTOMY Bilateral    feet  . wrist sugery     Family History:  Family History  Problem Relation Age of Onset  . Diabetes Mother   . Diabetes Maternal Grandmother   . Heart disease Maternal Grandmother   . Cancer Neg Hx   . Ovarian cancer Neg Hx    Family Psychiatric  History:  Maternal side of the family Social History:  Substance use Social History   Substance and Sexual Activity  Alcohol Use No     Social History   Substance and Sexual Activity  Drug Use Yes  .  Frequency: 7.0 times per week  . Types: Marijuana    Social History   Socioeconomic History  . Marital status: Single    Spouse name: Not on file  . Number of children: Not on file  . Years of education: Not on file  . Highest education level: Not on file  Occupational History  . Not on file  Social Needs  . Financial resource strain: Not on file  . Food insecurity:    Worry: Not on file    Inability: Not on file  . Transportation needs:    Medical: Not on file    Non-medical: Not on file  Tobacco Use  . Smoking status: Former Smoker    Types: Cigarettes    Last attempt to quit: 08/13/2015    Years since quitting: 2.7  . Smokeless tobacco: Never Used  Substance and Sexual Activity  . Alcohol use: No  . Drug use: Yes    Frequency: 7.0 times per week    Types: Marijuana  . Sexual activity: Yes  Lifestyle  . Physical activity:    Days per week: Not on file    Minutes per session: Not on file  . Stress: Not on file  Relationships  . Social connections:    Talks on phone: Not on file    Gets together: Not on file    Attends religious service: Not on file    Active member of club or organization: Not on file    Attends meetings of clubs or organizations: Not on file    Relationship status: Not on file  Other Topics Concern  . Not on file  Social History Narrative  . Not on file   Additional Social History:    Allergies:   Allergies  Allergen Reactions  . Penicillins Hives    Has patient had a PCN reaction causing immediate rash, facial/tongue/throat swelling, SOB or lightheadedness with hypotension: No Has patient had a PCN reaction causing severe rash involving mucus membranes or skin necrosis: No Has patient had a PCN reaction that required hospitalization No Has patient had a PCN reaction occurring within the last 10 years: No If all of the above answers are "NO", then may proceed with Cephalosporin use.  . Rocephin [Ceftriaxone] Hives  . Zithromax  [Azithromycin] Itching and Nausea And Vomiting  . Lidocaine Hives, Itching and Rash    Labs:  Results for orders placed or performed during the hospital encounter of 05/03/18 (from the past 48 hour(s))  Ethanol     Status: None   Collection Time: 05/03/18  6:19 PM  Result Value Ref Range   Alcohol, Ethyl (B) <10 <10 mg/dL    Comment: (NOTE) Lowest detectable limit for serum alcohol is 10 mg/dL. For medical purposes only. Performed at [facility], [address] [place], [place] [address]   CBC with Differential     Status: Abnormal   Collection Time: 05/03/18  6:19 PM  Result Value Ref Range   WBC 8.1 4.0 - 10.5  K/uL   RBC 3.81 (L) 3.87 - 5.11 MIL/uL   Hemoglobin 8.4 (L) 12.0 - 15.0 g/dL    Comment: Reticulocyte Hemoglobin testing may be clinically indicated, consider ordering this additional test [id number]    HCT 28.3 (L) 36.0 - 46.0 %   MCV 74.3 (L) 80.0 - 100.0 fL   MCH 22.0 (L) 26.0 - 34.0 pg   MCHC 29.7 (L) 30.0 - 36.0 g/dL   RDW 12.7 (H) 51.7 - 00.1 %   Platelets 278 150 - 400 K/uL   nRBC 0.0 0.0 - 0.2 %   Neutrophils Relative % 49 %   Neutro Abs 4.0 1.7 - 7.7 K/uL   Lymphocytes Relative 41 %   Lymphs Abs 3.3 0.7 - 4.0 K/uL   Monocytes Relative 8 %   Monocytes Absolute 0.6 0.1 - 1.0 K/uL   Eosinophils Relative 1 %   Eosinophils Absolute 0.1 0.0 - 0.5 K/uL   Basophils Relative 0 %   Basophils Absolute 0.0 0.0 - 0.1 K/uL   Immature Granulocytes 1 %   Abs Immature Granulocytes 0.05 0.00 - 0.07 K/uL    Comment: Performed at [facility], [address] [place], [place] [address]  Comprehensive metabolic panel     Status: Abnormal   Collection Time: 05/03/18  6:19 PM  Result Value Ref Range   Sodium 136 135 - 145 mmol/L   Potassium 3.3 (L) 3.5 - 5.1 mmol/L   Chloride 106 98 - 111 mmol/L   CO2 20 (L) 22 - 32 mmol/L   Glucose, Bld 90 70 - 99 mg/dL   BUN 6 6 - 20 mg/dL   Creatinine, Ser 9.67 0.44 - 1.00 mg/dL   Calcium 8.6 (L) 8.9 - 10.3  mg/dL   Total Protein 7.3 6.5 - 8.1 g/dL   Albumin 3.1 (L) 3.5 - 5.0 g/dL   AST 25 15 - 41 U/L   ALT 13 0 - 44 U/L   Alkaline Phosphatase 69 38 - 126 U/L   Total Bilirubin 0.4 0.3 - 1.2 mg/dL   GFR calc non Af Amer >60 >60 mL/min   GFR calc Af Amer >60 >60 mL/min   Anion gap 10 5 - 15    Comment: Performed at [facility], [address] [place], [place] [address]  Urine Drug Screen, Qualitative ([facility] only)     Status: Abnormal   Collection Time: 05/03/18  6:20 PM  Result Value Ref Range   Tricyclic, Ur Screen NONE DETECTED NONE DETECTED   Amphetamines, Ur Screen NONE DETECTED NONE DETECTED   MDMA (Ecstasy)Ur Screen NONE DETECTED NONE DETECTED   Cocaine Metabolite,Ur [place] NONE DETECTED NONE DETECTED   Opiate, Ur Screen NONE DETECTED NONE DETECTED   Phencyclidine (PCP) Ur S NONE DETECTED NONE DETECTED   Cannabinoid 50 Ng, Ur [place] POSITIVE (A) NONE DETECTED   Barbiturates, Ur Screen NONE DETECTED NONE DETECTED   Benzodiazepine, Ur Scrn NONE DETECTED NONE DETECTED   Methadone Scn, Ur NONE DETECTED NONE DETECTED    Comment: (NOTE) Tricyclics + metabolites, urine    Cutoff 1000 ng/mL Amphetamines + metabolites, urine  Cutoff 1000 ng/mL MDMA (Ecstasy), urine              Cutoff 500 ng/mL Cocaine Metabolite, urine          Cutoff 300 ng/mL Opiate + metabolites, urine        Cutoff 300 ng/mL Phencyclidine (PCP), urine         Cutoff 25 ng/mL Cannabinoid, urine  Cutoff 50 ng/mL Barbiturates + metabolites, urine  Cutoff 200 ng/mL Benzodiazepine, urine              Cutoff 200 ng/mL Methadone, urine                   Cutoff 300 ng/mL The urine drug screen provides only a preliminary, unconfirmed analytical test result and should not be used for non-medical purposes. Clinical consideration and professional judgment should be applied to any positive drug screen result due to possible interfering substances. A more specific alternate chemical method must be used in  order to obtain a confirmed analytical result. Gas chromatography / mass spectrometry (GC/MS) is the preferred confirmat ory method. Performed at [facility], [address] [place], [place] [address]   Pregnancy, urine     Status: Abnormal   Collection Time: 05/03/18  6:20 PM  Result Value Ref Range   Preg Test, Ur POSITIVE (A) NEGATIVE    Comment: Performed at [facility], [address] [place], [place] [address]  Urinalysis, Complete w Microscopic     Status: Abnormal   Collection Time: 05/03/18  6:20 PM  Result Value Ref Range   Color, Urine YELLOW (A) YELLOW   APPearance CLEAR (A) CLEAR   Specific Gravity, Urine 1.021 1.005 - 1.030   pH 6.0 5.0 - 8.0   Glucose, UA NEGATIVE NEGATIVE mg/dL   Hgb urine dipstick NEGATIVE NEGATIVE   Bilirubin Urine NEGATIVE NEGATIVE   Ketones, ur NEGATIVE NEGATIVE mg/dL   Protein, ur NEGATIVE NEGATIVE mg/dL   Nitrite NEGATIVE NEGATIVE   Leukocytes,Ua TRACE (A) NEGATIVE   RBC / HPF 0-5 0 - 5 RBC/hpf   WBC, UA 0-5 0 - 5 WBC/hpf   Bacteria, UA NONE SEEN NONE SEEN   Squamous Epithelial / LPF 6-10 0 - 5   Mucus PRESENT    Hyaline Casts, UA PRESENT     Comment: Performed at [facility], [address] [place], [place] [address]    Current Facility-Administered Medications  Medication Dose Route Frequency Provider Last Rate Last Dose  . albuterol (PROVENTIL) (2.5 MG/3ML) 0.083% nebulizer solution 3 mL  3 mL Inhalation Q4H PRN Rockne Menghini, MD       Current Outpatient Medications  Medication Sig Dispense Refill  . chlorpheniramine-HYDROcodone (TUSSIONEX PENNKINETIC ER) 10-8 MG/5ML SUER Take 5 mLs by mouth every 12 (twelve) hours as needed for cough. 140 mL 0  . guaiFENesin (MUCINEX) 600 MG 12 hr tablet Take 1 tablet (600 mg total) by mouth 2 (two) times daily. 30 tablet 1  . citalopram (CELEXA) 10 MG tablet Take 1 tablet (10 mg total) by mouth at bedtime. (Patient not taking: Reported on 04/04/2018) 30  tablet 0  . cyclobenzaprine (FLEXERIL) 10 MG tablet Take 1 tablet (10 mg total) by mouth 3 (three) times daily as needed. (Patient not taking: Reported on 04/04/2018) 15 tablet 0  . ferrous sulfate 325 (65 FE) MG tablet Take 1 tablet (325 mg total) by mouth daily with breakfast. (Patient not taking: Reported on 04/04/2018) 30 tablet 0  . fluticasone (FLONASE) 50 MCG/ACT nasal spray Place 1 spray into both nostrils 2 (two) times daily. (Patient not taking: Reported on 04/04/2018) 16 g 0  . hydrOXYzine (ATARAX/VISTARIL) 50 MG tablet Take 1 tablet (50 mg total) by mouth 3 (three) times daily as needed for anxiety. (Patient not taking: Reported on 04/04/2018) 90 tablet 0  . loratadine (CLARITIN) 10 MG tablet Take 1 tablet (10 mg total)  by mouth daily. (Patient not taking: Reported on 04/04/2018) 30 tablet 0  . Melatonin 5 MG TABS Take 1 tablet (5 mg total) by mouth at bedtime. (Patient not taking: Reported on 04/04/2018) 30 tablet 0  . predniSONE (DELTASONE) 50 MG tablet Take 1 tablet (50 mg total) by mouth daily. (Patient not taking: Reported on 05/03/2018) 5 tablet 0  . predniSONE (DELTASONE) 50 MG tablet Take 1 tablet (50 mg total) by mouth daily. (Patient not taking: Reported on 05/03/2018) 5 tablet 0  . promethazine (PHENERGAN) 25 MG tablet Take 1 tablet (25 mg total) by mouth every 6 (six) hours as needed for nausea or vomiting. (Patient not taking: Reported on 05/03/2018) 12 tablet 0  . QUEtiapine (SEROQUEL) 300 MG tablet Take 1 tablet (300 mg total) by mouth at bedtime. (Patient not taking: Reported on 04/04/2018) 30 tablet 0  . traZODone (DESYREL) 150 MG tablet Take 1 tablet (150 mg total) by mouth at bedtime as needed for sleep. (Patient not taking: Reported on 04/04/2018) 30 tablet 0    Musculoskeletal: Strength & Muscle Tone: within normal limits Gait & Station: normal Patient leans: N/A  Psychiatric Specialty Exam: Physical Exam  Nursing note and vitals reviewed. Constitutional: She is oriented  to person, place, and time. She appears well-developed and well-nourished.  HENT:  Head: Normocephalic and atraumatic.  Eyes: Pupils are equal, round, and reactive to light. Conjunctivae and EOM are normal.  Neck: Normal range of motion. Neck supple.  Cardiovascular: Normal rate and regular rhythm.  Respiratory: Effort normal and breath sounds normal.  Musculoskeletal: Normal range of motion.  Neurological: She is alert and oriented to person, place, and time. She has normal reflexes.  Skin: Skin is warm and dry.    Review of Systems  Constitutional: Negative.   HENT: Negative.   Eyes: Negative.   Respiratory: Positive for cough.   Cardiovascular: Negative.   Gastrointestinal: Negative.   Genitourinary: Negative.   Musculoskeletal: Positive for back pain.  Skin: Negative.   Endo/Heme/Allergies: Negative.   Psychiatric/Behavioral: Positive for depression, hallucinations, substance abuse and suicidal ideas. Negative for memory loss. The patient is nervous/anxious and has insomnia.     Blood pressure 114/68, pulse 93, temperature 98.1 F (36.7 C), temperature source Oral, resp. rate 18, height 5\' 2"  (1.575 m), weight 77.1 kg, SpO2 100 %, unknown if currently breastfeeding.Body mass index is 31.09 kg/m.  General Appearance: Fairly Groomed  Eye Contact:  Minimal  Speech:  Slow  Volume:  Decreased  Mood:  Depressed  Affect:  Depressed  Thought Process:  Irrelevant  Orientation:  Full (Time, Place, and Person)  Thought Content:  Illogical  Suicidal Thoughts:  Yes.  without intent/plan  Homicidal Thoughts:  No  Memory:  Immediate;   Good  Judgement:  Impaired  Insight:  Lacking  Psychomotor Activity:  Normal  Concentration:  Concentration: Fair  Recall:  [employer] of Knowledge:  Fair  Language:  Good  Akathisia:  No  Handed:  Right  AIMS (if indicated):     Assets:  Desire for Improvement Physical Health Vocational/Educational  ADL's:  Intact  Cognition:  WNL  Sleep:    Insomnia     Treatment Plan Summary: Daily contact with patient to assess and evaluate symptoms and progress in treatment and Medication management  Disposition: Recommend psychiatric Inpatient admission when medically cleared. Supportive therapy provided about ongoing stressors. Discussed crisis plan, support from social network, calling 911, coming to the Emergency Department, and calling Suicide Hotline.  Adela Lank  [place], NP 05/04/2018 4:20 AM

## 2018-05-04 NOTE — ED Notes (Signed)
Nurse called report to [facility] RN in BMU, Patient is IVC'd will transfer via w/c with police officer and tech.

## 2018-05-04 NOTE — ED Notes (Signed)
Patient came out of room ask for phone, let her know she could use phone at 0900, she is calm and cooperative, no signs of distress.

## 2018-05-05 MED ORDER — FERROUS SULFATE 325 (65 FE) MG PO TABS: 325.0000 mg | ORAL_TABLET | Freq: Every day | ORAL | 1 refills | Status: DC

## 2018-05-05 MED ORDER — QUETIAPINE FUMARATE 300 MG PO TABS: 300.0000 mg | ORAL_TABLET | Freq: Every day | ORAL | 1 refills | Status: DC

## 2018-05-05 MED ORDER — TRAZODONE HCL 150 MG PO TABS: 150.0000 mg | ORAL_TABLET | Freq: Every day | ORAL | 0 refills | Status: DC

## 2018-05-05 MED ORDER — PROMETHAZINE HCL 25 MG PO TABS: 25.0000 mg | ORAL_TABLET | Freq: Four times a day (QID) | ORAL | 0 refills | Status: DC | PRN

## 2018-05-05 MED ORDER — FOLIC ACID 1 MG PO TABS: 1.0000 mg | ORAL_TABLET | Freq: Every day | ORAL | 1 refills | Status: DC

## 2018-05-05 MED ORDER — MELATONIN 5 MG PO TABS: 5.0000 mg | ORAL_TABLET | Freq: Every day | ORAL | 0 refills | Status: DC

## 2018-05-05 MED ORDER — CITALOPRAM HYDROBROMIDE 10 MG PO TABS: 10.0000 mg | ORAL_TABLET | Freq: Every day | ORAL | 0 refills | Status: DC

## 2018-05-05 MED ORDER — ADULT MULTIVITAMIN LIQUID CH
15.0000 mL | Freq: Every day | ORAL | Status: DC
  Administered 2018-05-05 – 2018-05-06 (×2): 15 mL via ORAL
  Filled 2018-05-05 (×2): qty 15

## 2018-05-05 MED ORDER — DOCUSATE SODIUM 100 MG PO CAPS
200.0000 mg | ORAL_CAPSULE | Freq: Two times a day (BID) | ORAL | Status: DC
  Administered 2018-05-05: 200 mg via ORAL
  Filled 2018-05-05 (×2): qty 2

## 2018-05-05 MED ORDER — FAMOTIDINE 20 MG PO TABS: 20.0000 mg | ORAL_TABLET | Freq: Two times a day (BID) | ORAL | 0 refills | Status: DC

## 2018-05-05 MED ORDER — LORATADINE 10 MG PO TABS: 10.0000 mg | ORAL_TABLET | Freq: Every day | ORAL | 1 refills | Status: DC

## 2018-05-05 MED ORDER — CYCLOBENZAPRINE HCL 10 MG PO TABS: 10.0000 mg | ORAL_TABLET | Freq: Three times a day (TID) | ORAL | 0 refills | Status: DC | PRN

## 2018-05-05 MED ORDER — FLUTICASONE PROPIONATE 50 MCG/ACT NA SUSP: 1.0000 | Freq: Two times a day (BID) | NASAL | 0 refills | Status: DC

## 2018-05-05 MED ORDER — FERROUS SULFATE 325 (65 FE) MG PO TABS: 325.0000 mg | ORAL_TABLET | Freq: Every day | ORAL | 0 refills | Status: DC

## 2018-05-05 MED ORDER — FOLIC ACID 1 MG PO TABS
1.0000 mg | ORAL_TABLET | Freq: Every day | ORAL | Status: DC
  Administered 2018-05-05 – 2018-05-06 (×2): 1 mg via ORAL
  Filled 2018-05-05 (×2): qty 1

## 2018-05-05 MED ORDER — PROMETHAZINE HCL 25 MG PO TABS: 25.0000 mg | ORAL_TABLET | Freq: Four times a day (QID) | ORAL | 1 refills | Status: DC | PRN

## 2018-05-05 MED ORDER — MELATONIN 5 MG PO TABS: 5.0000 mg | ORAL_TABLET | Freq: Every day | ORAL | 1 refills | Status: DC

## 2018-05-05 MED ORDER — FLUTICASONE PROPIONATE 50 MCG/ACT NA SUSP: 1.0000 | Freq: Two times a day (BID) | NASAL | 1 refills | Status: DC

## 2018-05-05 MED ORDER — TRAZODONE HCL 150 MG PO TABS: 150.0000 mg | ORAL_TABLET | Freq: Every day | ORAL | 1 refills | Status: DC

## 2018-05-05 MED ORDER — ENSURE ENLIVE PO LIQD
237.0000 mL | Freq: Three times a day (TID) | ORAL | Status: DC
  Administered 2018-05-05 – 2018-05-06 (×3): 237 mL via ORAL

## 2018-05-05 MED ORDER — QUETIAPINE FUMARATE 300 MG PO TABS: 300.0000 mg | ORAL_TABLET | Freq: Every day | ORAL | 0 refills | Status: DC

## 2018-05-05 MED ORDER — DOCUSATE SODIUM 100 MG PO CAPS: 200.0000 mg | ORAL_CAPSULE | Freq: Two times a day (BID) | ORAL | 0 refills | Status: DC

## 2018-05-05 MED ORDER — FOLIC ACID 1 MG PO TABS: 1.0000 mg | ORAL_TABLET | Freq: Every day | ORAL | 0 refills | Status: DC

## 2018-05-05 MED ORDER — FAMOTIDINE 20 MG PO TABS
20.0000 mg | ORAL_TABLET | Freq: Two times a day (BID) | ORAL | Status: DC
  Administered 2018-05-05 – 2018-05-06 (×2): 20 mg via ORAL
  Filled 2018-05-05 (×2): qty 1

## 2018-05-05 MED ORDER — FAMOTIDINE 20 MG PO TABS: 20.0000 mg | ORAL_TABLET | Freq: Two times a day (BID) | ORAL | 1 refills | Status: DC

## 2018-05-05 MED ORDER — CYCLOBENZAPRINE HCL 10 MG PO TABS: 10.0000 mg | ORAL_TABLET | Freq: Three times a day (TID) | ORAL | 1 refills | Status: DC | PRN

## 2018-05-05 MED ORDER — DOCUSATE SODIUM 100 MG PO CAPS: 200.0000 mg | ORAL_CAPSULE | Freq: Two times a day (BID) | ORAL | 1 refills | Status: DC

## 2018-05-05 MED ORDER — CITALOPRAM HYDROBROMIDE 10 MG PO TABS: 10.0000 mg | ORAL_TABLET | Freq: Every day | ORAL | 1 refills | Status: DC

## 2018-05-05 NOTE — Progress Notes (Signed)
[facility] MD Progress Note  05/05/2018 4:14 PM Kristy Reid  MRN:  [id number] Subjective: Follow-up patient with schizoaffective disorder and PTSD.  Patient has no complaints today other than physical ones related to pregnancy.  Patient is denying any suicidal thoughts.  Denies any hallucinations.  Patient has been argumentative with peers but does not appear to be psychotic Principal Problem: Schizoaffective disorder, bipolar type (HCC) Diagnosis: Principal Problem:   Schizoaffective disorder, bipolar type (HCC) Active Problems:   Pregnancy (I trimester)   Cannabis use disorder, severe, dependence (HCC)   Borderline personality disorder (HCC)   PTSD (post-traumatic stress disorder)   MDD (major depressive disorder), recurrent, severe, with psychosis (HCC)   Alcohol abuse  Total Time spent with patient: 30 minutes  Past Psychiatric History: Patient has a history of PTSD schizoaffective disorder substance abuse behavior problems.  Past Medical History:  Past Medical History:  Diagnosis Date  . Anemia   . Anemia   . Anxiety   . Asthma   . Bipolar 1 disorder (HCC)   . Chronic back pain   . Depression   . Insomnia   . Personality disorder (HCC)    "boarderline"  . PTSD (post-traumatic stress disorder)     Past Surgical History:  Procedure Laterality Date  . BUNIONECTOMY Bilateral    feet  . wrist sugery     Family History:  Family History  Problem Relation Age of Onset  . Diabetes Mother   . Diabetes Maternal Grandmother   . Heart disease Maternal Grandmother   . Cancer Neg Hx   . Ovarian cancer Neg Hx    Family Psychiatric  History: None Social History:  Social History   Substance and Sexual Activity  Alcohol Use No     Social History   Substance and Sexual Activity  Drug Use Yes  . Frequency: 7.0 times per week  . Types: Marijuana    Social History   Socioeconomic History  . Marital status: Single    Spouse name: Not on file  . Number of children: Not  on file  . Years of education: Not on file  . Highest education level: Not on file  Occupational History  . Not on file  Social Needs  . Financial resource strain: Not on file  . Food insecurity:    Worry: Not on file    Inability: Not on file  . Transportation needs:    Medical: Not on file    Non-medical: Not on file  Tobacco Use  . Smoking status: Former Smoker    Types: Cigarettes    Last attempt to quit: 08/13/2015    Years since quitting: 2.7  . Smokeless tobacco: Never Used  Substance and Sexual Activity  . Alcohol use: No  . Drug use: Yes    Frequency: 7.0 times per week    Types: Marijuana  . Sexual activity: Yes  Lifestyle  . Physical activity:    Days per week: Not on file    Minutes per session: Not on file  . Stress: Not on file  Relationships  . Social connections:    Talks on phone: Not on file    Gets together: Not on file    Attends religious service: Not on file    Active member of club or organization: Not on file    Attends meetings of clubs or organizations: Not on file    Relationship status: Not on file  Other Topics Concern  . Not on file  Social  History Narrative  . Not on file   Additional Social History:                         Sleep: Fair  Appetite:  Fair  Current Medications: Current Facility-Administered Medications  Medication Dose Route Frequency Provider Last Rate Last Dose  . acetaminophen (TYLENOL) tablet 650 mg  650 mg Oral Q6H PRN Mariel Craft, MD      . albuterol (PROVENTIL) (2.5 MG/3ML) 0.083% nebulizer solution 3 mL  3 mL Inhalation Q4H PRN Mariel Craft, MD      . citalopram (CELEXA) tablet 10 mg  10 mg Oral Daily Opaline Reyburn, [name], MD   10 mg at 05/05/18 4628  . cyclobenzaprine (FLEXERIL) tablet 10 mg  10 mg Oral TID PRN Dave Mannes T, MD      . docusate sodium (COLACE) capsule 200 mg  200 mg Oral BID Anne-Marie Genson T, MD      . famotidine (PEPCID) tablet 20 mg  20 mg Oral BID Kaydance Bowie T, MD       . feeding supplement (ENSURE ENLIVE) (ENSURE ENLIVE) liquid 237 mL  237 mL Oral TID BM Camdynn Maranto T, MD   237 mL at 05/05/18 1352  . ferrous sulfate tablet 325 mg  325 mg Oral Q breakfast Mariel Craft, MD      . fluticasone [facility]) 50 MCG/ACT nasal spray 1 spray  1 spray Each Nare BID Mariel Craft, MD   1 spray at 05/05/18 [phone]  . folic acid (FOLVITE) tablet 1 mg  1 mg Oral Daily Milany Geck T, MD      . multivitamin liquid 15 mL  15 mL Oral Daily Jarmaine Ehrler T, MD      . promethazine (PHENERGAN) tablet 25 mg  25 mg Oral Q6H PRN Mariel Craft, MD      . QUEtiapine (SEROQUEL) tablet 300 mg  300 mg Oral QHS Margaux Engen, [name], MD   300 mg at 05/04/18 2136  . traZODone (DESYREL) tablet 150 mg  150 mg Oral QHS Egidio Lofgren T, MD   150 mg at 05/04/18 2136    Lab Results:  Results for orders placed or performed during the hospital encounter of 05/03/18 (from the past 48 hour(s))  Ethanol     Status: None   Collection Time: 05/03/18  6:19 PM  Result Value Ref Range   Alcohol, Ethyl (B) <10 <10 mg/dL    Comment: (NOTE) Lowest detectable limit for serum alcohol is 10 mg/dL. For medical purposes only. Performed at [facility], [address] [place], [place] [address]   CBC with Differential     Status: Abnormal   Collection Time: 05/03/18  6:19 PM  Result Value Ref Range   WBC 8.1 4.0 - 10.5 K/uL   RBC 3.81 (L) 3.87 - 5.11 MIL/uL   Hemoglobin 8.4 (L) 12.0 - 15.0 g/dL    Comment: Reticulocyte Hemoglobin testing may be clinically indicated, consider ordering this additional test [id number]    HCT 28.3 (L) 36.0 - 46.0 %   MCV 74.3 (L) 80.0 - 100.0 fL   MCH 22.0 (L) 26.0 - 34.0 pg   MCHC 29.7 (L) 30.0 - 36.0 g/dL   RDW 38.3 (H) 29.1 - 91.6 %   Platelets 278 150 - 400 K/uL   nRBC 0.0 0.0 - 0.2 %   Neutrophils Relative % 49 %   Neutro Abs 4.0 1.7 - 7.7 K/uL  Lymphocytes Relative 41 %   Lymphs Abs 3.3 0.7 - 4.0 K/uL   Monocytes Relative 8 %   Monocytes  Absolute 0.6 0.1 - 1.0 K/uL   Eosinophils Relative 1 %   Eosinophils Absolute 0.1 0.0 - 0.5 K/uL   Basophils Relative 0 %   Basophils Absolute 0.0 0.0 - 0.1 K/uL   Immature Granulocytes 1 %   Abs Immature Granulocytes 0.05 0.00 - 0.07 K/uL    Comment: Performed at [facility], [address] [place], [place] [address]  Comprehensive metabolic panel     Status: Abnormal   Collection Time: 05/03/18  6:19 PM  Result Value Ref Range   Sodium 136 135 - 145 mmol/L   Potassium 3.3 (L) 3.5 - 5.1 mmol/L   Chloride 106 98 - 111 mmol/L   CO2 20 (L) 22 - 32 mmol/L   Glucose, Bld 90 70 - 99 mg/dL   BUN 6 6 - 20 mg/dL   Creatinine, Ser 4.090.67 0.44 - 1.00 mg/dL   Calcium 8.6 (L) 8.9 - 10.3 mg/dL   Total Protein 7.3 6.5 - 8.1 g/dL   Albumin 3.1 (L) 3.5 - 5.0 g/dL   AST 25 15 - 41 U/L   ALT 13 0 - 44 U/L   Alkaline Phosphatase 69 38 - 126 U/L   Total Bilirubin 0.4 0.3 - 1.2 mg/dL   GFR calc non Af Amer >60 >60 mL/min   GFR calc Af Amer >60 >60 mL/min   Anion gap 10 5 - 15    Comment: Performed at [facility], [address] [place], [place] [address]  Urine Drug Screen, Qualitative ([facility] only)     Status: Abnormal   Collection Time: 05/03/18  6:20 PM  Result Value Ref Range   Tricyclic, Ur Screen NONE DETECTED NONE DETECTED   Amphetamines, Ur Screen NONE DETECTED NONE DETECTED   MDMA (Ecstasy)Ur Screen NONE DETECTED NONE DETECTED   Cocaine Metabolite,Ur [place] NONE DETECTED NONE DETECTED   Opiate, Ur Screen NONE DETECTED NONE DETECTED   Phencyclidine (PCP) Ur S NONE DETECTED NONE DETECTED   Cannabinoid 50 Ng, Ur [place] POSITIVE (A) NONE DETECTED   Barbiturates, Ur Screen NONE DETECTED NONE DETECTED   Benzodiazepine, Ur Scrn NONE DETECTED NONE DETECTED   Methadone Scn, Ur NONE DETECTED NONE DETECTED    Comment: (NOTE) Tricyclics + metabolites, urine    Cutoff 1000 ng/mL Amphetamines + metabolites, urine  Cutoff 1000 ng/mL MDMA (Ecstasy), urine              Cutoff 500  ng/mL Cocaine Metabolite, urine          Cutoff 300 ng/mL Opiate + metabolites, urine        Cutoff 300 ng/mL Phencyclidine (PCP), urine         Cutoff 25 ng/mL Cannabinoid, urine                 Cutoff 50 ng/mL Barbiturates + metabolites, urine  Cutoff 200 ng/mL Benzodiazepine, urine              Cutoff 200 ng/mL Methadone, urine                   Cutoff 300 ng/mL The urine drug screen provides only a preliminary, unconfirmed analytical test result and should not be used for non-medical purposes. Clinical consideration and professional judgment should be applied to any positive drug screen result due to possible interfering substances. A more specific alternate chemical method must be  used in order to obtain a confirmed analytical result. Gas chromatography / mass spectrometry (GC/MS) is the preferred confirmat ory method. Performed at [facility], [address] [place], [place] [address]   Pregnancy, urine     Status: Abnormal   Collection Time: 05/03/18  6:20 PM  Result Value Ref Range   Preg Test, Ur POSITIVE (A) NEGATIVE    Comment: Performed at [facility], [address] [place], [place] [address]  Urinalysis, Complete w Microscopic     Status: Abnormal   Collection Time: 05/03/18  6:20 PM  Result Value Ref Range   Color, Urine YELLOW (A) YELLOW   APPearance CLEAR (A) CLEAR   Specific Gravity, Urine 1.021 1.005 - 1.030   pH 6.0 5.0 - 8.0   Glucose, UA NEGATIVE NEGATIVE mg/dL   Hgb urine dipstick NEGATIVE NEGATIVE   Bilirubin Urine NEGATIVE NEGATIVE   Ketones, ur NEGATIVE NEGATIVE mg/dL   Protein, ur NEGATIVE NEGATIVE mg/dL   Nitrite NEGATIVE NEGATIVE   Leukocytes,Ua TRACE (A) NEGATIVE   RBC / HPF 0-5 0 - 5 RBC/hpf   WBC, UA 0-5 0 - 5 WBC/hpf   Bacteria, UA NONE SEEN NONE SEEN   Squamous Epithelial / LPF 6-10 0 - 5   Mucus PRESENT    Hyaline Casts, UA PRESENT     Comment: Performed at [facility], [address] [place],  [place] [address]    Blood Alcohol level:  Lab Results  Component Value Date   [facility] <10 05/03/2018   ETH <10 09/21/2017    Metabolic Disorder Labs: Lab Results  Component Value Date   HGBA1C 5.3 09/21/2017   MPG 105 09/21/2017   MPG 103 07/19/2016   Lab Results  Component Value Date   PROLACTIN 9.2 07/19/2016   PROLACTIN 67.6 (H) 11/15/2015   Lab Results  Component Value Date   CHOL 153 09/21/2017   TRIG 74 09/21/2017   HDL 46 09/21/2017   CHOLHDL 3.3 09/21/2017   VLDL 15 09/21/2017   LDLCALC 92 09/21/2017   LDLCALC 122 (H) 07/19/2016    Physical Findings: AIMS:  , ,  ,  ,    CIWA:    COWS:     Musculoskeletal: Strength & Muscle Tone: within normal limits Gait & Station: normal Patient leans: N/A  Psychiatric Specialty Exam: Physical Exam  Nursing note and vitals reviewed. Constitutional: She appears well-developed and well-nourished.  HENT:  Head: Normocephalic and atraumatic.  Eyes: Pupils are equal, round, and reactive to light. Conjunctivae are normal.  Neck: Normal range of motion.  Cardiovascular: Normal heart sounds.  Respiratory: Effort normal.  GI: Soft.  Musculoskeletal: Normal range of motion.  Neurological: She is alert.  Skin: Skin is warm and dry.  Psychiatric: She has a normal mood and affect. Her behavior is normal. Judgment and thought content normal.    Review of Systems  Constitutional: Negative.   HENT: Negative.   Eyes: Negative.   Respiratory: Negative.   Cardiovascular: Negative.   Gastrointestinal: Negative.   Musculoskeletal: Negative.   Skin: Negative.   Neurological: Negative.   Psychiatric/Behavioral: Negative.     Blood pressure 136/75, pulse (!) 112, temperature 98.5 F (36.9 C), temperature source Oral, resp. rate 16, height 5\' 2"  (1.575 m), weight 76.2 kg, SpO2 100 %, unknown if currently breastfeeding.Body mass index is 30.73 kg/m.  General Appearance: Casual  Eye Contact:  Good  Speech:  Clear and Coherent  Volume:   Normal  Mood:  Euthymic  Affect:  Congruent  Thought Process:  Goal Directed  Orientation:  Full (Time, Place, and Person)  Thought Content:  Logical  Suicidal Thoughts:  No  Homicidal Thoughts:  No  Memory:  Immediate;   Fair Recent;   Fair Remote;   Fair  Judgement:  Fair  Insight:  Fair  Psychomotor Activity:  Decreased  Concentration:  Concentration: Fair  Recall:  [employer]Fair  Fund of Knowledge:  Fair  Language:  Fair  Akathisia:  No  Handed:  Right  AIMS (if indicated):     Assets:  Desire for Improvement Housing Physical Health  ADL's:  Intact  Cognition:  WNL  Sleep:  Number of Hours: 6.45     Treatment Plan Summary: Daily contact with patient to assess and evaluate symptoms and progress in treatment, Medication management and Plan Patient appears to be stabilizing.  Tolerating medicine without difficulty.  Agreeable to overall treatment plan.  We will work with her on a likely discharge plan within the next day or 2.  Mordecai RasmussenJohn Maytal Mijangos, MD 05/05/2018, 4:14 PM

## 2018-05-05 NOTE — Progress Notes (Signed)
[facility] MD Progress Note  05/05/2018 3:05 PM Kristy Reid  MRN:  [id number] Subjective: Follow-up patient with history of schizoaffective disorder.  Patient seen chart reviewed.  Patient tells me she is feeling better today.  Her affect is still blunted but she appears to be alert and oriented.  She is interacting with peers appropriately with appropriate range of affect.  She denies any suicidal or homicidal thoughts at this point.  Tolerating medicine without any difficulty.  Her only physical request was to restart her Colace. Principal Problem: Schizoaffective disorder, bipolar type (HCC) Diagnosis: Principal Problem:   Schizoaffective disorder, bipolar type (HCC) Active Problems:   Pregnancy (I trimester)   Cannabis use disorder, severe, dependence (HCC)   Borderline personality disorder (HCC)   PTSD (post-traumatic stress disorder)   MDD (major depressive disorder), recurrent, severe, with psychosis (HCC)   Alcohol abuse  Total Time spent with patient: 30 minutes  Past Psychiatric History: Patient has a past history of schizoaffective disorder with chronic mood instability and past suicidality  Past Medical History:  Past Medical History:  Diagnosis Date  . Anemia   . Anemia   . Anxiety   . Asthma   . Bipolar 1 disorder (HCC)   . Chronic back pain   . Depression   . Insomnia   . Personality disorder (HCC)    "boarderline"  . PTSD (post-traumatic stress disorder)     Past Surgical History:  Procedure Laterality Date  . BUNIONECTOMY Bilateral    feet  . wrist sugery     Family History:  Family History  Problem Relation Age of Onset  . Diabetes Mother   . Diabetes Maternal Grandmother   . Heart disease Maternal Grandmother   . Cancer Neg Hx   . Ovarian cancer Neg Hx    Family Psychiatric  History: See previous Social History:  Social History   Substance and Sexual Activity  Alcohol Use No     Social History   Substance and Sexual Activity  Drug Use Yes   . Frequency: 7.0 times per week  . Types: Marijuana    Social History   Socioeconomic History  . Marital status: Single    Spouse name: Not on file  . Number of children: Not on file  . Years of education: Not on file  . Highest education level: Not on file  Occupational History  . Not on file  Social Needs  . Financial resource strain: Not on file  . Food insecurity:    Worry: Not on file    Inability: Not on file  . Transportation needs:    Medical: Not on file    Non-medical: Not on file  Tobacco Use  . Smoking status: Former Smoker    Types: Cigarettes    Last attempt to quit: 08/13/2015    Years since quitting: 2.7  . Smokeless tobacco: Never Used  Substance and Sexual Activity  . Alcohol use: No  . Drug use: Yes    Frequency: 7.0 times per week    Types: Marijuana  . Sexual activity: Yes  Lifestyle  . Physical activity:    Days per week: Not on file    Minutes per session: Not on file  . Stress: Not on file  Relationships  . Social connections:    Talks on phone: Not on file    Gets together: Not on file    Attends religious service: Not on file    Active member of club or organization: Not  on file    Attends meetings of clubs or organizations: Not on file    Relationship status: Not on file  Other Topics Concern  . Not on file  Social History Narrative  . Not on file   Additional Social History:                         Sleep: Fair  Appetite:  Fair  Current Medications: Current Facility-Administered Medications  Medication Dose Route Frequency Provider Last Rate Last Dose  . acetaminophen (TYLENOL) tablet 650 mg  650 mg Oral Q6H PRN Mariel Craft, MD      . albuterol (PROVENTIL) (2.5 MG/3ML) 0.083% nebulizer solution 3 mL  3 mL Inhalation Q4H PRN Mariel Craft, MD      . citalopram (CELEXA) tablet 10 mg  10 mg Oral Daily Loras Grieshop, [name], MD   10 mg at 05/05/18 1191  . cyclobenzaprine (FLEXERIL) tablet 10 mg  10 mg Oral TID PRN  Flor Houdeshell T, MD      . docusate sodium (COLACE) capsule 200 mg  200 mg Oral BID Zhuri Krass T, MD      . famotidine (PEPCID) tablet 20 mg  20 mg Oral BID Brileigh Sevcik T, MD      . feeding supplement (ENSURE ENLIVE) (ENSURE ENLIVE) liquid 237 mL  237 mL Oral TID BM Laurieanne Galloway T, MD   237 mL at 05/05/18 1352  . ferrous sulfate tablet 325 mg  325 mg Oral Q breakfast Mariel Craft, MD      . fluticasone [facility]) 50 MCG/ACT nasal spray 1 spray  1 spray Each Nare BID Mariel Craft, MD   1 spray at 05/05/18 [phone]  . folic acid (FOLVITE) tablet 1 mg  1 mg Oral Daily Yussef Jorge T, MD      . multivitamin liquid 15 mL  15 mL Oral Daily Huckleberry Martinson T, MD      . promethazine (PHENERGAN) tablet 25 mg  25 mg Oral Q6H PRN Mariel Craft, MD      . QUEtiapine (SEROQUEL) tablet 300 mg  300 mg Oral QHS Dakari Cregger, [name], MD   300 mg at 05/04/18 2136  . traZODone (DESYREL) tablet 150 mg  150 mg Oral QHS Leanore Biggers T, MD   150 mg at 05/04/18 2136    Lab Results:  Results for orders placed or performed during the hospital encounter of 05/03/18 (from the past 48 hour(s))  Ethanol     Status: None   Collection Time: 05/03/18  6:19 PM  Result Value Ref Range   Alcohol, Ethyl (B) <10 <10 mg/dL    Comment: (NOTE) Lowest detectable limit for serum alcohol is 10 mg/dL. For medical purposes only. Performed at [facility], [address] [place], [place] [address]   CBC with Differential     Status: Abnormal   Collection Time: 05/03/18  6:19 PM  Result Value Ref Range   WBC 8.1 4.0 - 10.5 K/uL   RBC 3.81 (L) 3.87 - 5.11 MIL/uL   Hemoglobin 8.4 (L) 12.0 - 15.0 g/dL    Comment: Reticulocyte Hemoglobin testing may be clinically indicated, consider ordering this additional test [id number]    HCT 28.3 (L) 36.0 - 46.0 %   MCV 74.3 (L) 80.0 - 100.0 fL   MCH 22.0 (L) 26.0 - 34.0 pg   MCHC 29.7 (L) 30.0 - 36.0 g/dL   RDW 69.6 (H) 29.5 - 28.4 %  Platelets 278 150 - 400 K/uL   nRBC  0.0 0.0 - 0.2 %   Neutrophils Relative % 49 %   Neutro Abs 4.0 1.7 - 7.7 K/uL   Lymphocytes Relative 41 %   Lymphs Abs 3.3 0.7 - 4.0 K/uL   Monocytes Relative 8 %   Monocytes Absolute 0.6 0.1 - 1.0 K/uL   Eosinophils Relative 1 %   Eosinophils Absolute 0.1 0.0 - 0.5 K/uL   Basophils Relative 0 %   Basophils Absolute 0.0 0.0 - 0.1 K/uL   Immature Granulocytes 1 %   Abs Immature Granulocytes 0.05 0.00 - 0.07 K/uL    Comment: Performed at [facility], [address] [place], [place] [address]  Comprehensive metabolic panel     Status: Abnormal   Collection Time: 05/03/18  6:19 PM  Result Value Ref Range   Sodium 136 135 - 145 mmol/L   Potassium 3.3 (L) 3.5 - 5.1 mmol/L   Chloride 106 98 - 111 mmol/L   CO2 20 (L) 22 - 32 mmol/L   Glucose, Bld 90 70 - 99 mg/dL   BUN 6 6 - 20 mg/dL   Creatinine, Ser 9.89 0.44 - 1.00 mg/dL   Calcium 8.6 (L) 8.9 - 10.3 mg/dL   Total Protein 7.3 6.5 - 8.1 g/dL   Albumin 3.1 (L) 3.5 - 5.0 g/dL   AST 25 15 - 41 U/L   ALT 13 0 - 44 U/L   Alkaline Phosphatase 69 38 - 126 U/L   Total Bilirubin 0.4 0.3 - 1.2 mg/dL   GFR calc non Af Amer >60 >60 mL/min   GFR calc Af Amer >60 >60 mL/min   Anion gap 10 5 - 15    Comment: Performed at [facility], [address], [place], [place] [address]  Urine Drug Screen, Qualitative ([facility] only)     Status: Abnormal   Collection Time: 05/03/18  6:20 PM  Result Value Ref Range   Tricyclic, Ur Screen NONE DETECTED NONE DETECTED   Amphetamines, Ur Screen NONE DETECTED NONE DETECTED   MDMA (Ecstasy)Ur Screen NONE DETECTED NONE DETECTED   Cocaine Metabolite,Ur [place] NONE DETECTED NONE DETECTED   Opiate, Ur Screen NONE DETECTED NONE DETECTED   Phencyclidine (PCP) Ur S NONE DETECTED NONE DETECTED   Cannabinoid 50 Ng, Ur [place] POSITIVE (A) NONE DETECTED   Barbiturates, Ur Screen NONE DETECTED NONE DETECTED   Benzodiazepine, Ur Scrn NONE DETECTED NONE DETECTED   Methadone Scn, Ur NONE DETECTED NONE DETECTED     Comment: (NOTE) Tricyclics + metabolites, urine    Cutoff 1000 ng/mL Amphetamines + metabolites, urine  Cutoff 1000 ng/mL MDMA (Ecstasy), urine              Cutoff 500 ng/mL Cocaine Metabolite, urine          Cutoff 300 ng/mL Opiate + metabolites, urine        Cutoff 300 ng/mL Phencyclidine (PCP), urine         Cutoff 25 ng/mL Cannabinoid, urine                 Cutoff 50 ng/mL Barbiturates + metabolites, urine  Cutoff 200 ng/mL Benzodiazepine, urine              Cutoff 200 ng/mL Methadone, urine                   Cutoff 300 ng/mL The urine drug screen provides only a preliminary, unconfirmed analytical test result and should not  be used for non-medical purposes. Clinical consideration and professional judgment should be applied to any positive drug screen result due to possible interfering substances. A more specific alternate chemical method must be used in order to obtain a confirmed analytical result. Gas chromatography / mass spectrometry (GC/MS) is the preferred confirmat ory method. Performed at [facility], [address] [place], [place] [address]   Pregnancy, urine     Status: Abnormal   Collection Time: 05/03/18  6:20 PM  Result Value Ref Range   Preg Test, Ur POSITIVE (A) NEGATIVE    Comment: Performed at [facility], [address] [place], [place] [address]  Urinalysis, Complete w Microscopic     Status: Abnormal   Collection Time: 05/03/18  6:20 PM  Result Value Ref Range   Color, Urine YELLOW (A) YELLOW   APPearance CLEAR (A) CLEAR   Specific Gravity, Urine 1.021 1.005 - 1.030   pH 6.0 5.0 - 8.0   Glucose, UA NEGATIVE NEGATIVE mg/dL   Hgb urine dipstick NEGATIVE NEGATIVE   Bilirubin Urine NEGATIVE NEGATIVE   Ketones, ur NEGATIVE NEGATIVE mg/dL   Protein, ur NEGATIVE NEGATIVE mg/dL   Nitrite NEGATIVE NEGATIVE   Leukocytes,Ua TRACE (A) NEGATIVE   RBC / HPF 0-5 0 - 5 RBC/hpf   WBC, UA 0-5 0 - 5 WBC/hpf   Bacteria, UA NONE SEEN NONE SEEN    Squamous Epithelial / LPF 6-10 0 - 5   Mucus PRESENT    Hyaline Casts, UA PRESENT     Comment: Performed at [facility], [address] [place], [place] [address]    Blood Alcohol level:  Lab Results  Component Value Date   [facility] 05/03/2018   ETH <10 09/21/2017    Metabolic Disorder Labs: Lab Results  Component Value Date   HGBA1C 5.3 09/21/2017   MPG 105 09/21/2017   MPG 103 07/19/2016   Lab Results  Component Value Date   PROLACTIN 9.2 07/19/2016   PROLACTIN 67.6 (H) 11/15/2015   Lab Results  Component Value Date   CHOL 153 09/21/2017   TRIG 74 09/21/2017   HDL 46 09/21/2017   CHOLHDL 3.3 09/21/2017   VLDL 15 09/21/2017   LDLCALC 92 09/21/2017   LDLCALC 122 (H) 07/19/2016    Physical Findings: AIMS:  , ,  ,  ,    CIWA:    COWS:     Musculoskeletal: Strength & Muscle Tone: within normal limits Gait & Station: normal Patient leans: N/A  Psychiatric Specialty Exam: Physical Exam  Nursing note and vitals reviewed. Constitutional: She appears well-developed and well-nourished.  HENT:  Head: Normocephalic and atraumatic.  Eyes: Pupils are equal, round, and reactive to light. Conjunctivae are normal.  Neck: Normal range of motion.  Cardiovascular: Normal heart sounds.  Respiratory: Effort normal.  GI: Soft.  Musculoskeletal: Normal range of motion.  Neurological: She is alert.  Skin: Skin is warm and dry.  Psychiatric: She has a normal mood and affect. Her behavior is normal. Judgment and thought content normal.    Review of Systems  Constitutional: Negative.   HENT: Negative.   Eyes: Negative.   Respiratory: Negative.   Cardiovascular: Negative.   Gastrointestinal: Negative.   Musculoskeletal: Negative.   Skin: Negative.   Neurological: Negative.   Psychiatric/Behavioral: Negative.     Blood pressure 136/75, pulse (!) 112, temperature 98.5 F (36.9 C), temperature source Oral, resp. rate 16, height 5\' 2"  (1.575 m), weight 76.2  kg, SpO2 100 %, unknown  if currently breastfeeding.Body mass index is 30.73 kg/m.  General Appearance: Casual  Eye Contact:  Fair  Speech:  Clear and Coherent  Volume:  Normal  Mood:  Euthymic  Affect:  Congruent  Thought Process:  Goal Directed  Orientation:  Full (Time, Place, and Person)  Thought Content:  Logical  Suicidal Thoughts:  No  Homicidal Thoughts:  No  Memory:  Immediate;   Fair Recent;   Fair Remote;   Fair  Judgement:  Fair  Insight:  Fair  Psychomotor Activity:  Normal  Concentration:  Concentration: Fair  Recall:  [employer] of Knowledge:  Fair  Language:  Fair  Akathisia:  No  Handed:  Right  AIMS (if indicated):     Assets:  Desire for Improvement Housing Resilience  ADL's:  Intact  Cognition:  WNL  Sleep:  Number of Hours: 6.45     Treatment Plan Summary: Daily contact with patient to assess and evaluate symptoms and progress in treatment, Medication management and Plan Continue current medicine management.  Encourage patient's attendance at groups and activities.  I am adding prescriptions for medicine for GI reflux and her Colace.  Patient will meet with social work and treatment team.  She may be ready for discharge within a couple days.  Mordecai Rasmussen, MD 05/05/2018, 3:05 PM

## 2018-05-05 NOTE — Progress Notes (Signed)
Recreation Therapy Notes  INPATIENT RECREATION THERAPY ASSESSMENT  Patient Details Name: Kristy Reid MRN: [id number] DOB: [birth year] Today's Date: 05/05/2018       Information Obtained From: Patient  Able to Participate in Assessment/Interview: Yes  Patient Presentation: Responsive  Reason for Admission (Per Patient): Active Symptoms, Other (Comments)(I am trying to get my son back)  Patient Stressors:    Coping Skills:   Music, Other (Comment)(Blow bubbles)  Leisure Interests (2+):  (Eat,sleep, smoke, shower)  Frequency of Recreation/Participation: Monthly  Awareness of Community Resources:     [employer]:     Current Use:    If no, Barriers?:    Expressed Interest in [employer] Information:    [place] of Residence:  Film/video editor  Patient Main Form of Transportation: Walk  Patient Strengths:  Great mother, work well with a team  Patient Identified Areas of Improvement:  My attitude and facial expression  Patient Goal for Hospitalization:  To be a better me  Current SI (including self-harm):  No  Current HI:  No  Current AVH: No  Staff Intervention Plan: Group Attendance, Collaborate with Interdisciplinary Treatment Team  Consent to Intern Participation: N/A  Kristy Reid 05/05/2018, 2:36 PM

## 2018-05-05 NOTE — Progress Notes (Signed)
Recreation Therapy Notes   Date: 05/05/2018  Time: 9:30 am  Location: Craft Room  Behavioral response: Appropriate  Intervention Topic: Communication  Discussion/Intervention:  Group content today was focused on communication. The group defined communication and ways to communicate with others. Individuals stated reason why communication is important and some reasons to communicate with others. Patients expressed if they thought they were good at communicating with others and ways they could improve their communication skills. The group identified important parts of communication and some experiences they have had in the past with communication. The group participated in the intervention "What is that?", where they had a chance to test out their communication skills and identify ways to improve their communication techniques.  Clinical Observations/Feedback:  Patient came to group and defined communication as interacting with outside world and others. She explained she communicates with others by using body language and talking. Participant explained that communication is important get the things needed. Patient was pulled from group by peer support and later returned to group.   Individual was social with peers and staff while participating in group. Ekam Bonebrake LRT/CTRS         Ezequias Lard 05/05/2018 11:25 AM

## 2018-05-05 NOTE — Plan of Care (Signed)
Patient is present in the milieu  with a steady gait. Frequents this Clinical research associate with complaints. "I need a stool softner, I have heart burn, I need my blood pressure checked more than twice a day, I have pre-eclampsia." Patient's needs were meet. Patient attended group with active participation. Affect flat and blunted. Milieu remains safe with q 15 minute safety checks.

## 2018-05-05 NOTE — Progress Notes (Signed)
D: Pt during assessments denies SI/HI/AVH, can contract for safety. Pt is pleasant and cooperative, engages appropriately. Pt. Reports the baby feels, "fine". Pt. Denies pain. Pt. Endorses a normal mood. Pt. Eager to discharge stating she would like to, "fix this mess".    A: Q x 15 minute observation checks were completed for safety. Patient was provided with education. Patient was given/offered medications per orders. Patient  was encourage to attend groups, participate in unit activities and continue with plan of care. Pt. Chart and plans of care reviewed. Pt. Given support and encouragement.   R: Patient is complaint with medications and unit procedures. Pt. Observed out in the milieu often with peers, socializing well, and brighter in her affect. Pt. Attends snack time, eating good and goes to group. Vital signs monitored per orders. Fetal heart tones scheduled for the morning to be complete with labor and delivery staff.              Precautionary checks every 15 minutes for safety maintained, room free of safety hazards, patient sustains no injury or falls during this shift. Will endorse care to next shift.

## 2018-05-05 NOTE — Plan of Care (Signed)
Pt. Is complaint with medications. Pt. Denies si/hi/avh, can contract for safety. Pt. Endorses a normal mood.    Problem: Education: Goal: Emotional status will improve Outcome: Progressing Goal: Mental status will improve Outcome: Progressing   Problem: Safety: Goal: Periods of time without injury will increase Outcome: Progressing   Problem: Medication: Goal: Compliance with prescribed medication regimen will improve Outcome: Progressing

## 2018-05-05 NOTE — [facility] Group Notes (Signed)
Balance In Life 05/05/2018 1PM  Type of Therapy/Topic:  Group Therapy:  Balance in Life  Participation Level:  Active  Description of Group:   This group will address the concept of balance and how it feels and looks when one is unbalanced. Patients will be encouraged to process areas in their lives that are out of balance and identify reasons for remaining unbalanced. Facilitators will guide patients in utilizing problem-solving interventions to address and correct the stressor making their life unbalanced. Understanding and applying boundaries will be explored and addressed for obtaining and maintaining a balanced life. Patients will be encouraged to explore ways to assertively make their unbalanced needs known to significant others in their lives, using other group members and facilitator for support and feedback.  Therapeutic Goals: 1. Patient will identify two or more emotions or situations they have that consume much of in their lives. 2. Patient will identify signs/triggers that life has become out of balance:  3. Patient will identify two ways to set boundaries in order to achieve balance in their lives:  4. Patient will demonstrate ability to communicate their needs through discussion and/or role plays  Summary of Patient Progress: Actively and appropriately engaged in the group. Patient was able to provide support and validation to other group members.Patient practiced active listening when interacting with the facilitator and other group members. Patient discussed with group her challenges and ways she tries to find balance in life such as going grocery shopping, exercising, cooking. Pt says going to the grocery store is a healthy coping method for her when she has had a tough day. Patient demonstrated good insight, respected boundaries and was receptive to feedback from group members.    Therapeutic Modalities:   Cognitive Behavioral Therapy Solution-Focused Therapy Assertiveness  Training  Tiea Manninen Philip Aspen, LCSW

## 2018-05-05 NOTE — [facility] Suicide Risk Assessment (Signed)
[facility] INPATIENT:  Family/Significant Other Suicide Prevention Education  Suicide Prevention Education:  Patient Refusal for Family/Significant Other Suicide Prevention Education: The patient Kristy Reid has refused to provide written consent for family/significant other to be provided Family/Significant Other Suicide Prevention Education during admission and/or prior to discharge.  Physician notified.  SPE completed with pt, as pt refused to consent to family contact. SPI pamphlet provided to pt and pt was encouraged to share information with support network, ask questions, and talk about any concerns relating to SPE. Pt denies access to guns/firearms and verbalized understanding of information provided. Mobile Crisis information also provided to pt.    Charlann Lange Alejandrina Raimer MSW LCSW 05/05/2018, 10:01 AM

## 2018-05-05 NOTE — Progress Notes (Signed)
Fetal heart tones assessment completed this shift by labor and delivery nurses. Labor and delivery nurse reports WDL fetal heart tones and reports she will be charting her assessment.

## 2018-05-05 NOTE — Progress Notes (Signed)
Initial Nutrition Assessment  DOCUMENTATION CODES:   Not applicable  INTERVENTION:   Ensure Enlive po TID, each supplement provides 350 kcal and 20 grams of protein  Magic cup TID with meals, each supplement provides 290 kcal and 9 grams of protein  Liquid MVI daily   Folic acid 58m po daily   Ferrous sulfate 3227mpo daily   Snacks   NUTRITION DIAGNOSIS:   Increased nutrient needs related to other (see comment)(Pregnancy ) as evidenced by increased estimated needs.  GOAL:   Patient will meet greater than or equal to 90% of their needs  MONITOR:   PO intake, Supplement acceptance  REASON FOR ASSESSMENT:   Consult Assessment of nutrition requirement/status  ASSESSMENT:   89.o. female with a history of borderline personality disorder, substance abuse, schizophrenia, bipolar disorder, recurrent severe major depression with psychotic features, off of her medications, hx of anemia, G4, P2 A1 approximately [redacted] weeks pregnant presenting with suicidal ideations and worsening hallucinations.   Pt flu A positive- 2/12  Met with pt in room today. Pt reports fair appetite and oral intake pta. Pt reports intermittent nausea throughout the day in which she takes promethazine twice daily for. Pt also reports continuous heartburn for which she takes Tums prn. Pt reports drinking 3 chocolate Ensure per day at home. Pt reports taking gummy prenatal vitamins at home but reports that she does not always remember to take these. Pt reports that she is unable to tolerate the regular prenatal vitamins. Pt reports eating fair since admit; reports that she is eating mainly salads at meals with sugar cookies and drinking some Ensure. Pt reports that she likes fruits (except honeydew), grapes, cantaloupe, multigrain bars (no blueberry), applesauce, saltines, yogurt, ice cream, ham and ham wraps (with cheese, mayo, pickles). Pt does not like tu[place]r roast beef. Pt reports her UBW is ~185-195lbs. Pt  reports a 30lb weight loss since becoming pregnant. Per chart, pt weighed 185lbs on 7/9 and 160lbs on 7/18; RD unsure which weights are correct as wt history in chart is very unreliable. RD will add MVI (pt prefers liquid) and folic acid daily. RD will also order supplements, snacks and double portions with meals. MD notified of pt's request for promethazine and Tums.   Of note pt with microcytic anemia; pt is receiving iron supplementation  Medications reviewed and include: celexa, ferrous sulfate  Labs reviewed: K 3.3(L)- 2/18 Hgb 8.4(L), Hct 28.3(L), MCV 74.3(L), MCH 22.0(L), MCHC 29.7(L)  Diet Order:   Diet Order            Diet regular Room service appropriate? No; Fluid consistency: Thin  Diet effective now             EDUCATION NEEDS:   Education needs have been addressed  Skin:  Skin Assessment: Reviewed RN Assessment  Last BM:  pta  Height:   Ht Readings from Last 1 Encounters:  05/04/18 '5\' 2"'  (1.575 m)    Weight:   Wt Readings from Last 1 Encounters:  05/04/18 76.2 kg    Ideal Body Weight:  50 kg  BMI:  Body mass index is 30.73 kg/m.  Estimated Nutritional Needs:   Kcal:  2000-2300kcal/day   Protein:  85-100g/day   Fluid:  2L/day   CaKoleen DistanceS, RD, LDN Pager #- [phone]#- [phone] [id number] Hours Pager: [phone]
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
## 2018-05-05 NOTE — BHH Suicide Risk Assessment (Signed)
[facility] Discharge Suicide Risk Assessment   Principal Problem: Schizoaffective disorder, bipolar type [facility]) Discharge Diagnoses: Principal Problem:   Schizoaffective disorder, bipolar type (HCC) Active Problems:   Pregnancy (I trimester)   Cannabis use disorder, severe, dependence (HCC)   Borderline personality disorder (HCC)   PTSD (post-traumatic stress disorder)   MDD (major depressive disorder), recurrent, severe, with psychosis (HCC)   Alcohol abuse   Total Time spent with patient: 30 minutes  Musculoskeletal: Strength & Muscle Tone: within normal limits Gait & Station: normal Patient leans: N/A  Psychiatric Specialty Exam: Review of Systems  Constitutional: Negative.   HENT: Negative.   Eyes: Negative.   Respiratory: Negative.   Cardiovascular: Negative.   Gastrointestinal: Negative.   Musculoskeletal: Negative.   Skin: Negative.   Neurological: Negative.   Psychiatric/Behavioral: Negative.     Blood pressure 136/75, pulse (!) 112, temperature 98.5 F (36.9 C), temperature source Oral, resp. rate 16, height 5\' 2"  (1.575 m), weight 76.2 kg, SpO2 100 %, unknown if currently breastfeeding.Body mass index is 30.73 kg/m.  General Appearance: Casual  Eye Contact::  Good  Speech:  Clear and Coherent409  Volume:  Normal  Mood:  Euthymic  Affect:  Congruent  Thought Process:  Goal Directed  Orientation:  Full (Time, Place, and Person)  Thought Content:  Logical  Suicidal Thoughts:  No  Homicidal Thoughts:  No  Memory:  Negative  Judgement:  Fair  Insight:  Fair  Psychomotor Activity:  Normal  Concentration:  Fair  Recall:  Fair  Fund of Knowledge:Fair  Language: Fair  Akathisia:  No  Handed:  Right  AIMS (if indicated):     Assets:  Desire for Improvement Housing Physical Health  Sleep:  Number of Hours: 6.45  Cognition: WNL  ADL's:  Intact   Mental Status Per Nursing Assessment::   On Admission:  Suicidal ideation indicated by patient  Demographic  Factors:  Unemployed  Loss Factors: Financial problems/change in socioeconomic status  Historical Factors: Prior suicide attempts and Impulsivity  Risk Reduction Factors:   Responsible for children under 88 years of age, Sense of responsibility to family, Religious beliefs about death and Positive therapeutic relationship  Continued Clinical Symptoms:  Depression:   Anhedonia  Cognitive Features That Contribute To Risk:  Closed-mindedness    Suicide Risk:  Minimal: No identifiable suicidal ideation.  Patients presenting with no risk factors but with morbid ruminations; may be classified as minimal risk based on the severity of the depressive symptoms  Follow-up Information    [facility] Follow up.   Why:  You have a hospital follow up appointment scheduled for  Contact information: [address] [facility] [address] [phone]           Plan Of Care/Follow-up recommendations:  Activity:  Activity as tolerated Diet:  Regular diet Other:  Follow-up with [facility]  Mordecai Rasmussen, MD 05/05/2018, 4:48 PM

## 2018-05-06 NOTE — Progress Notes (Signed)
Patient alert and oriented x 4. Ambulates unit with steady gait. Verbally denies SI/HI/AVH and pain. Patient discharged on above date and time. Verbalized understanding the discharge information provided to patient upon discharge. Patient departed unit with discharge paperwork, prescriptions, 7 day supply of medications and personal belongings. Picked up by [employer] to be transported to [place]. No distress noted.

## 2018-05-06 NOTE — Progress Notes (Signed)
D - Patient was in the day room upon arrival to the unit. Patient denies SI/HI/AVH and pain. Patient endorses some anxiety and depression, 5/10 on both. Patient was observed by this writer interacting appropriately on the unit with peers and staff.   A - Patient compliant with medication administration per MD orders and procedures on the unit. Patient given education. Patient given support and encouragement to be active in her treatment plan. Patient informed to let staff know if there are any issues or problems on the unit.   R - Patient being monitored Q 15 minutes for safety per unit protocol. Patient remains safe on the unit.

## 2018-05-06 NOTE — Progress Notes (Signed)
  [facility] Adult Case Management Discharge Plan :  Will you be returning to the same living situation after discharge:  Yes,  Aunt house At discharge, do you have transportation home?: Yes,  Aunt will pick her up Do you have the ability to pay for your medications: Yes,  [place] Medicaid  Release of information consent forms completed and in the chart;   Patient to Follow up at: Follow-up Information    [facility] Follow up.   Why:  Please meet Unk Pinto Monday 05/09/18 at [facility] at 7:00AM for peer support services. Thank you. Contact information: [address] [place] [address] [phone]           Next level of care provider has access to [facility] Link:no  Safety Planning and Suicide Prevention discussed: Yes,  SPE completed with pt as pt declined collateral contact  Have you used any form of tobacco in the last 30 days? (Cigarettes, Smokeless Tobacco, Cigars, and/or Pipes): Yes  Has patient been referred to the Quitline?: Patient refused referral  Patient has been referred for addiction treatment: N/A  Mechele Dawley, LCSW 05/06/2018, 9:12 AM

## 2018-05-06 NOTE — Discharge Summary (Signed)
Physician Discharge Summary Note  Patient:  Kristy Reid is an 23 y.o., female MRN:  [id number] DOB:  09-12-1995 Patient phone:  [phone] (home)  Patient address:   [address] [place] [address],  Total Time spent with patient: 1 hour  Date of Admission:  05/04/2018 Date of Discharge: May 06, 2018  Reason for Admission: Admitted to the hospital through the emergency room with agitation and complaints of being off her medicine.  Had told DSS that she was thinking of overdosing on alcohol.  In the hospital the patient was irritable and argumentative at times but did not appear to be psychotic.  No evidence of delusions or hallucinations.  Denied any suicidal ideation.  Patient was started back on medication as she had previously been taking for her schizoaffective disorder.  She was educated about the risks and benefits of medication during pregnancy but given her history of mood instability she feels like it is worth the risk.  Had no side effects from medicine.  Went to groups participated in individual therapy.  Patient is discharged and agrees to a plan to follow-up with [facility] on Monday morning.  She is strongly encouraged to discontinue cannabis abuse  Principal Problem: Schizoaffective disorder, bipolar type [facility]) Discharge Diagnoses: Principal Problem:   Schizoaffective disorder, bipolar type (HCC) Active Problems:   Pregnancy (I trimester)   Cannabis use disorder, severe, dependence (HCC)   Borderline personality disorder (HCC)   PTSD (post-traumatic stress disorder)   MDD (major depressive disorder), recurrent, severe, with psychosis (HCC)   Alcohol abuse   Past Psychiatric History: Past history of chronic mood instability agitation and threatening behavior para-suicidal behavior substance abuse  Past Medical History:  Past Medical History:  Diagnosis Date  . Anemia   . Anemia   . Anxiety   . Asthma   . Bipolar 1 disorder (HCC)   . Chronic back  pain   . Depression   . Insomnia   . Personality disorder (HCC)    "boarderline"  . PTSD (post-traumatic stress disorder)     Past Surgical History:  Procedure Laterality Date  . BUNIONECTOMY Bilateral    feet  . wrist sugery     Family History:  Family History  Problem Relation Age of Onset  . Diabetes Mother   . Diabetes Maternal Grandmother   . Heart disease Maternal Grandmother   . Cancer Neg Hx   . Ovarian cancer Neg Hx    Family Psychiatric  History: None Social History:  Social History   Substance and Sexual Activity  Alcohol Use No     Social History   Substance and Sexual Activity  Drug Use Yes  . Frequency: 7.0 times per week  . Types: Marijuana    Social History   Socioeconomic History  . Marital status: Single    Spouse name: Not on file  . Number of children: Not on file  . Years of education: Not on file  . Highest education level: Not on file  Occupational History  . Not on file  Social Needs  . Financial resource strain: Not on file  . Food insecurity:    Worry: Not on file    Inability: Not on file  . Transportation needs:    Medical: Not on file    Non-medical: Not on file  Tobacco Use  . Smoking status: Former Smoker    Types: Cigarettes    Last attempt to quit: 08/13/2015    Years since quitting:  2.7  . Smokeless tobacco: Never Used  Substance and Sexual Activity  . Alcohol use: No  . Drug use: Yes    Frequency: 7.0 times per week    Types: Marijuana  . Sexual activity: Yes  Lifestyle  . Physical activity:    Days per week: Not on file    Minutes per session: Not on file  . Stress: Not on file  Relationships  . Social connections:    Talks on phone: Not on file    Gets together: Not on file    Attends religious service: Not on file    Active member of club or organization: Not on file    Attends meetings of clubs or organizations: Not on file    Relationship status: Not on file  Other Topics Concern  . Not on file    Social History Narrative  . Not on file    Hospital Course: See note above.  Patient did not make any suicide attempts and was not physically aggressive.  She was irritable and argumentative but easily redirected and did not appear to be psychotic.  She tolerated restart of outpatient medications and agreed to medicine during pregnancy.  Patient is discharged back to stay with family and will follow up with outpatient care at [facility]  Physical Findings: AIMS:  , ,  ,  ,    CIWA:    COWS:     Musculoskeletal: Strength & Muscle Tone: within normal limits Gait & Station: normal Patient leans: N/A  Psychiatric Specialty Exam: Physical Exam  Nursing note and vitals reviewed. Constitutional: She appears well-developed and well-nourished.  HENT:  Head: Normocephalic and atraumatic.  Eyes: Pupils are equal, round, and reactive to light. Conjunctivae are normal.  Neck: Normal range of motion.  Cardiovascular: Regular rhythm and normal heart sounds.  Respiratory: Effort normal. No respiratory distress.  GI: Soft.    Musculoskeletal: Normal range of motion.  Neurological: She is alert.  Skin: Skin is warm and dry.  Psychiatric: She has a normal mood and affect. Her speech is normal and behavior is normal. Judgment and thought content normal. Cognition and memory are normal.    Review of Systems  Constitutional: Negative.   HENT: Negative.   Eyes: Negative.   Respiratory: Negative.   Cardiovascular: Negative.   Gastrointestinal: Negative.   Musculoskeletal: Negative.   Skin: Negative.   Neurological: Negative.   Psychiatric/Behavioral: Negative.     Blood pressure 137/72, pulse (!) 108, temperature 98 F (36.7 C), temperature source Oral, resp. rate 18, height 5\' 2"  (1.575 m), weight 76.2 kg, SpO2 100 %, unknown if currently breastfeeding.Body mass index is 30.73 kg/m.  General Appearance: Casual  Eye Contact:  Good  Speech:  Clear and Coherent  Volume:  Normal  Mood:  Euthymic   Affect:  Appropriate  Thought Process:  Coherent  Orientation:  Full (Time, Place, and Person)  Thought Content:  Logical  Suicidal Thoughts:  No  Homicidal Thoughts:  No  Memory:  Immediate;   Fair Recent;   Fair Remote;   Fair  Judgement:  Fair  Insight:  Fair  Psychomotor Activity:  Normal  Concentration:  Concentration: Fair  Recall:  [employer] of Knowledge:  Fair  Language:  Fair  Akathisia:  No  Handed:  Right  AIMS (if indicated):     Assets:  Desire for Improvement Housing Resilience  ADL's:  Intact  Cognition:  WNL  Sleep:  Number of Hours: 6.75  Have you used any form of tobacco in the last 30 days? (Cigarettes, Smokeless Tobacco, Cigars, and/or Pipes): Yes  Has this patient used any form of tobacco in the last 30 days? (Cigarettes, Smokeless Tobacco, Cigars, and/or Pipes) Yes, Yes, A prescription for an FDA-approved tobacco cessation medication was offered at discharge and the patient refused  Blood Alcohol level:  Lab Results  Component Value Date   [facility]   ETH <10 09/21/2017    Metabolic Disorder Labs:  Lab Results  Component Value Date   HGBA1C 5.3 09/21/2017   MPG 105 09/21/2017   MPG 103 07/19/2016   Lab Results  Component Value Date   PROLACTIN 9.2 07/19/2016   PROLACTIN 67.6 (H) 11/15/2015   Lab Results  Component Value Date   CHOL 153 09/21/2017   TRIG 74 09/21/2017   HDL 46 09/21/2017   CHOLHDL 3.3 09/21/2017   VLDL 15 09/21/2017   LDLCALC 92 09/21/2017   LDLCALC 122 (H) 07/19/2016    See Psychiatric Specialty Exam and Suicide Risk Assessment completed by Attending Physician prior to discharge.  Discharge destination:  Home  Is patient on multiple antipsychotic therapies at discharge:  No   Has Patient had three or more failed trials of antipsychotic monotherapy by history:  No  Recommended Plan for Multiple Antipsychotic Therapies: NA  Discharge Instructions    Diet - low sodium heart healthy   Complete  by:  As directed    Increase activity slowly   Complete by:  As directed      Allergies as of 05/06/2018      Reactions   Penicillins Hives   Has patient had a PCN reaction causing immediate rash, facial/tongue/throat swelling, SOB or lightheadedness with hypotension: No Has patient had a PCN reaction causing severe rash involving mucus membranes or skin necrosis: No Has patient had a PCN reaction that required hospitalization No Has patient had a PCN reaction occurring within the last 10 years: No If all of the above answers are "NO", then may proceed with Cephalosporin use.   Rocephin [ceftriaxone] Hives   Zithromax [azithromycin] Itching, Nausea And Vomiting   Lidocaine Hives, Itching, Rash      Medication List    STOP taking these medications   chlorpheniramine-HYDROcodone 10-8 MG/5ML Suer Commonly known as:  TUSSIONEX PENNKINETIC ER   guaiFENesin 600 MG 12 hr tablet Commonly known as:  MUCINEX   hydrOXYzine 50 MG tablet Commonly known as:  ATARAX/VISTARIL   predniSONE 50 MG tablet Commonly known as:  DELTASONE     TAKE these medications     Indication  citalopram 10 MG tablet Commonly known as:  CELEXA Take 1 tablet (10 mg total) by mouth daily. What changed:  when to take this  Indication:  Depression   cyclobenzaprine 10 MG tablet Commonly known as:  FLEXERIL Take 1 tablet (10 mg total) by mouth 3 (three) times daily as needed.  Indication:  Muscle Spasm   docusate sodium 100 MG capsule Commonly known as:  COLACE Take 2 capsules (200 mg total) by mouth 2 (two) times daily.  Indication:  Constipation   famotidine 20 MG tablet Commonly known as:  PEPCID Take 1 tablet (20 mg total) by mouth 2 (two) times daily.  Indication:  Gastroesophageal Reflux Disease   ferrous sulfate 325 (65 FE) MG tablet Take 1 tablet (325 mg total) by mouth daily with breakfast.  Indication:  Iron Deficiency   fluticasone 50 MCG/ACT nasal spray Commonly known as:   FLONASE  Place 1 spray into both nostrils 2 (two) times daily.  Indication:  Allergic Rhinitis   folic acid 1 MG tablet Commonly known as:  FOLVITE Take 1 tablet (1 mg total) by mouth daily.  Indication:  Anemia From Inadequate Folic Acid   loratadine 10 MG tablet Commonly known as:  CLARITIN Take 1 tablet (10 mg total) by mouth daily.  Indication:  Hayfever   Melatonin 5 MG Tabs Take 1 tablet (5 mg total) by mouth at bedtime.  Indication:  Trouble Sleeping   promethazine 25 MG tablet Commonly known as:  PHENERGAN Take 1 tablet (25 mg total) by mouth every 6 (six) hours as needed for nausea or vomiting.  Indication:  Nausea and Vomiting in Pregnancy   QUEtiapine 300 MG tablet Commonly known as:  SEROQUEL Take 1 tablet (300 mg total) by mouth at bedtime.  Indication:  Depressive Phase of Manic-Depression, mood disorder   traZODone 150 MG tablet Commonly known as:  DESYREL Take 1 tablet (150 mg total) by mouth at bedtime. What changed:    when to take this  reasons to take this  Indication:  Trouble Sleeping      Follow-up Information    [facility] Follow up.   Why:  Please meet Unk Pinto Monday 05/09/18 at [facility] at 7:00AM for peer support services. Thank you. Contact information: [address] [place] [address] [phone]           Follow-up recommendations:  Activity:  Activity as tolerated Diet:  Regular diet Other:  Follow-up with [facility].  Stop smoking marijuana.  Comments: Patient has been counseled about the dangers of substance abuse and agrees to outpatient treatment.  Follow-up with outpatient OB/GYN treatment.  Signed: Mordecai Rasmussen, MD 05/06/2018, 4:41 PM

## 2018-05-06 NOTE — Tx Team (Signed)
Interdisciplinary Treatment and Diagnostic Plan Update  05/06/2018 Time of Session: 900AM Kristy Reid MRN: [id number]  Principal Diagnosis: Schizoaffective disorder, bipolar type (HCC)  Secondary Diagnoses: Principal Problem:   Schizoaffective disorder, bipolar type (HCC) Active Problems:   Pregnancy (I trimester)   Cannabis use disorder, severe, dependence (HCC)   Borderline personality disorder (HCC)   PTSD (post-traumatic stress disorder)   MDD (major depressive disorder), recurrent, severe, with psychosis (HCC)   Alcohol abuse   Current Medications:  Current Facility-Administered Medications  Medication Dose Route Frequency Provider Last Rate Last Dose  . acetaminophen (TYLENOL) tablet 650 mg  650 mg Oral Q6H PRN Mariel Craft, MD      . albuterol (PROVENTIL) (2.5 MG/3ML) 0.083% nebulizer solution 3 mL  3 mL Inhalation Q4H PRN Mariel Craft, MD      . citalopram (CELEXA) tablet 10 mg  10 mg Oral Daily [name], [name], MD   10 mg at 05/06/18 7416  . cyclobenzaprine (FLEXERIL) tablet 10 mg  10 mg Oral TID PRN [name], [name], MD      . docusate sodium (COLACE) capsule 200 mg  200 mg Oral BID [name], [name], MD   200 mg at 05/05/18 1705  . famotidine (PEPCID) tablet 20 mg  20 mg Oral BID [name], [name], MD   20 mg at 05/06/18 0813  . feeding supplement (ENSURE ENLIVE) (ENSURE ENLIVE) liquid 237 mL  237 mL Oral TID BM [name], [name], MD   237 mL at 05/05/18 2000  . ferrous sulfate tablet 325 mg  325 mg Oral Q breakfast Mariel Craft, MD      . fluticasone [facility]) 50 MCG/ACT nasal spray 1 spray  1 spray Each Nare BID Mariel Craft, MD   1 spray at 05/06/18 [phone]  . folic acid (FOLVITE) tablet 1 mg  1 mg Oral Daily [name], [name], MD   1 mg at 05/06/18 0813  . multivitamin liquid 15 mL  15 mL Oral Daily [name], [name], MD   15 mL at 05/06/18 0814  . promethazine (PHENERGAN) tablet 25 mg  25 mg Oral Q6H PRN Mariel Craft, MD      . QUEtiapine (SEROQUEL) tablet  300 mg  300 mg Oral QHS [name], [name], MD   300 mg at 05/05/18 2205  . traZODone (DESYREL) tablet 150 mg  150 mg Oral QHS [name], [name], MD   150 mg at 05/05/18 2205   PTA Medications: Medications Prior to Admission  Medication Sig Dispense Refill Last Dose  . chlorpheniramine-HYDROcodone (TUSSIONEX PENNKINETIC ER) 10-8 MG/5ML SUER Take 5 mLs by mouth every 12 (twelve) hours as needed for cough. 140 mL 0 Past Week at Unknown time  . citalopram (CELEXA) 10 MG tablet Take 1 tablet (10 mg total) by mouth at bedtime. (Patient not taking: Reported on 04/04/2018) 30 tablet 0 Not Taking at Unknown time  . guaiFENesin (MUCINEX) 600 MG 12 hr tablet Take 1 tablet (600 mg total) by mouth 2 (two) times daily. 30 tablet 1 Past Week at Unknown time  . hydrOXYzine (ATARAX/VISTARIL) 50 MG tablet Take 1 tablet (50 mg total) by mouth 3 (three) times daily as needed for anxiety. (Patient not taking: Reported on 04/04/2018) 90 tablet 0 Not Taking at Unknown time  . predniSONE (DELTASONE) 50 MG tablet Take 1 tablet (50 mg total) by mouth daily. (Patient not taking: Reported on 05/03/2018) 5 tablet 0 Completed Course at Unknown time  . predniSONE (DELTASONE)  50 MG tablet Take 1 tablet (50 mg total) by mouth daily. (Patient not taking: Reported on 05/03/2018) 5 tablet 0 Not Taking at Unknown time  . traZODone (DESYREL) 150 MG tablet Take 1 tablet (150 mg total) by mouth at bedtime as needed for sleep. (Patient not taking: Reported on 04/04/2018) 30 tablet 0 Not Taking at Unknown time  . [DISCONTINUED] cyclobenzaprine (FLEXERIL) 10 MG tablet Take 1 tablet (10 mg total) by mouth 3 (three) times daily as needed. (Patient not taking: Reported on 04/04/2018) 15 tablet 0 Not Taking at Unknown time  . [DISCONTINUED] ferrous sulfate 325 (65 FE) MG tablet Take 1 tablet (325 mg total) by mouth daily with breakfast. (Patient not taking: Reported on 04/04/2018) 30 tablet 0 Not Taking at Unknown time  . [DISCONTINUED] fluticasone (FLONASE)  50 MCG/ACT nasal spray Place 1 spray into both nostrils 2 (two) times daily. (Patient not taking: Reported on 04/04/2018) 16 g 0 Not Taking at Unknown time  . [DISCONTINUED] loratadine (CLARITIN) 10 MG tablet Take 1 tablet (10 mg total) by mouth daily. (Patient not taking: Reported on 04/04/2018) 30 tablet 0 Not Taking at Unknown time  . [DISCONTINUED] Melatonin 5 MG TABS Take 1 tablet (5 mg total) by mouth at bedtime. (Patient not taking: Reported on 04/04/2018) 30 tablet 0 Not Taking at Unknown time  . [DISCONTINUED] promethazine (PHENERGAN) 25 MG tablet Take 1 tablet (25 mg total) by mouth every 6 (six) hours as needed for nausea or vomiting. (Patient not taking: Reported on 05/03/2018) 12 tablet 0 Completed Course at Unknown time  . [DISCONTINUED] QUEtiapine (SEROQUEL) 300 MG tablet Take 1 tablet (300 mg total) by mouth at bedtime. (Patient not taking: Reported on 04/04/2018) 30 tablet 0 Not Taking at Unknown time    Patient Stressors: Financial difficulties Medication change or noncompliance Substance abuse  Patient Strengths: Ability for insight Active sense of humor Average or above average intelligence Communication skills Supportive family/friends  Treatment Modalities: Medication Management, Group therapy, Case management,  1 to 1 session with clinician, Psychoeducation, Recreational therapy.   Physician Treatment Plan for Primary Diagnosis: Schizoaffective disorder, bipolar type (HCC) Long Term Goal(s): Improvement in symptoms so as ready for discharge Improvement in symptoms so as ready for discharge   Short Term Goals: Ability to disclose and discuss suicidal ideas Ability to demonstrate self-control will improve Ability to identify and develop effective coping behaviors will improve Ability to maintain clinical measurements within normal limits will improve  Medication Management: Evaluate patient's response, side effects, and tolerance of medication regimen.  Therapeutic  Interventions: 1 to 1 sessions, Unit Group sessions and Medication administration.  Evaluation of Outcomes: Adequate for Discharge  Physician Treatment Plan for Secondary Diagnosis: Principal Problem:   Schizoaffective disorder, bipolar type (HCC) Active Problems:   Pregnancy (I trimester)   Cannabis use disorder, severe, dependence (HCC)   Borderline personality disorder (HCC)   PTSD (post-traumatic stress disorder)   MDD (major depressive disorder), recurrent, severe, with psychosis (HCC)   Alcohol abuse  Long Term Goal(s): Improvement in symptoms so as ready for discharge Improvement in symptoms so as ready for discharge   Short Term Goals: Ability to disclose and discuss suicidal ideas Ability to demonstrate self-control will improve Ability to identify and develop effective coping behaviors will improve Ability to maintain clinical measurements within normal limits will improve     Medication Management: Evaluate patient's response, side effects, and tolerance of medication regimen.  Therapeutic Interventions: 1 to 1 sessions, Unit Group sessions and Medication administration.  Evaluation of Outcomes: Adequate for Discharge   RN Treatment Plan for Primary Diagnosis: Schizoaffective disorder, bipolar type (HCC) Long Term Goal(s): Knowledge of disease and therapeutic regimen to maintain health will improve  Short Term Goals: Ability to demonstrate self-control, Ability to participate in decision making will improve and Ability to identify and develop effective coping behaviors will improve  Medication Management: RN will administer medications as ordered by provider, will assess and evaluate patient's response and provide education to patient for prescribed medication. RN will report any adverse and/or side effects to prescribing provider.  Therapeutic Interventions: 1 on 1 counseling sessions, Psychoeducation, Medication administration, Evaluate responses to treatment, Monitor  vital signs and CBGs as ordered, Perform/monitor CIWA, COWS, AIMS and Fall Risk screenings as ordered, Perform wound care treatments as ordered.  Evaluation of Outcomes: Adequate for Discharge   LCSW Treatment Plan for Primary Diagnosis: Schizoaffective disorder, bipolar type (HCC) Long Term Goal(s): Safe transition to appropriate next level of care at discharge, Engage patient in therapeutic group addressing interpersonal concerns.  Short Term Goals: Engage patient in aftercare planning with referrals and resources, Increase emotional regulation, Facilitate acceptance of mental health diagnosis and concerns and Increase skills for wellness and recovery  Therapeutic Interventions: Assess for all discharge needs, 1 to 1 time with Social worker, Explore available resources and support systems, Assess for adequacy in community support network, Educate family and significant other(s) on suicide prevention, Complete Psychosocial Assessment, Interpersonal group therapy.  Evaluation of Outcomes: Adequate for Discharge   Progress in Treatment: Attending groups: Yes. Participating in groups: Yes. Taking medication as prescribed: Yes. Toleration medication: Yes. Family/Significant other contact made: No, will contact:  SPE completed with pt as pt declined collateral contact Patient understands diagnosis: Yes. Discussing patient identified problems/goals with staff: Yes. Medical problems stabilized or resolved: Yes. Denies suicidal/homicidal ideation: Yes. Issues/concerns per patient self-inventory: No. Other: n/a  New problem(s) identified: No, Describe:  n/a  New Short Term/Long Term Goal(s): Detox, elimination of AVH/symptoms of psychosis, medication management for mood stabilization; elimination of SI thoughts; development of comprehensive mental wellness/sobriety plan.   Patient Goals:  "To work on my attitude"  Discharge Plan or Barriers: SPE pamphlet, Mobile Crisis information, and  AA/NA information provided to patient for additional community support and resources. Pt has an appointment scheduled at [facility] Monday 05/09/18 at 7AM.  Reason for Continuation of Hospitalization: none  Estimated Length of Stay: Today 05/06/2018  Attendees: Patient: Kristy Reid 05/06/2018 9:07 AM  Physician: Dr Toni Amend MD 05/06/2018 9:07 AM  Nursing:  05/06/2018 9:07 AM  RN Care Manager: 05/06/2018 9:07 AM  Social Worker: Zollie Scale Montarius Kitagawa LCSW 05/06/2018 9:07 AM  Recreational Therapist:  05/06/2018 9:07 AM  Other:  05/06/2018 9:07 AM  Other:  05/06/2018 9:07 AM  Other: 05/06/2018 9:07 AM    Scribe for Treatment Team: Charlann Lange Linzie Boursiquot, LCSW 05/06/2018 9:07 AM

## 2018-05-06 NOTE — [facility] Group Notes (Signed)
LCSW Group Therapy Note  05/06/2018 1:00 PM  Type of Therapy and Topic:  Group Therapy:  Feelings around Relapse and Recovery  Participation Level:  Active   Description of Group:    Patients in this group will discuss emotions they experience before and after a relapse. They will process how experiencing these feelings, or avoidance of experiencing them, relates to having a relapse. Facilitator will guide patients to explore emotions they have related to recovery. Patients will be encouraged to process which emotions are more powerful. They will be guided to discuss the emotional reaction significant others in their lives may have to their relapse or recovery. Patients will be assisted in exploring ways to respond to the emotions of others without this contributing to a relapse.  Therapeutic Goals: 1. Patient will identify two or more emotions that lead to a relapse for them 2. Patient will identify two emotions that result when they relapse 3. Patient will identify two emotions related to recovery 4. Patient will demonstrate ability to communicate their needs through discussion and/or role plays   Summary of Patient Progress: Patient was present and active participant in group.  Patient was attentive and supportive in group.  Patient reports that she has felt "scared, appalled, numb and confused about her relapse in mental health.  Pt reports that she felt numb as being the strongest emotion.  Patient reports that she engages in the following as coping skills "bubbles, sing, walk and talk to grandma".    Therapeutic Modalities:   Cognitive Behavioral Therapy Solution-Focused Therapy Assertiveness Training Relapse Prevention Therapy   Penni Homans, MSW, LCSW 05/06/2018 1:53 PM

## 2018-05-06 NOTE — Plan of Care (Signed)
Patient compliant with medication administration per MD orders  Problem: Medication: Goal: Compliance with prescribed medication regimen will improve Outcome: Progressing   

## 2018-05-13 ENCOUNTER — Other Ambulatory Visit: Payer: Self-pay | Admitting: Internal Medicine

## 2018-05-13 MED ORDER — HYDROCOD POLST-CPM POLST ER 10-8 MG/5ML PO SUER: 5.0000 mL | Freq: Two times a day (BID) | ORAL | 0 refills | Status: DC | PRN

## 2018-05-13 NOTE — Progress Notes (Signed)
[facility] pharmacist called requesting Tussionex be sent to their pharmacy. Was prescribed in [place] on 2/12 and sent to the incorrect pharmacy. Rx sent in.   Marcy Siren, D.O. [facility] Family Medicine Fellow, [facility], [facility] 05/13/2018, 2:09 PM

## 2018-05-13 NOTE — Progress Notes (Signed)
[facility] pharmacist in [place] calling requesting order for T

## 2018-05-19 ENCOUNTER — Encounter: Payer: Self-pay | Admitting: Obstetrics & Gynecology

## 2018-05-19 ENCOUNTER — Ambulatory Visit (INDEPENDENT_AMBULATORY_CARE_PROVIDER_SITE_OTHER): Payer: Medicaid Other | Admitting: Obstetrics & Gynecology

## 2018-05-19 VITALS — BP 110/70 | Wt 184.0 lb

## 2018-05-19 DIAGNOSIS — O0933 Supervision of pregnancy with insufficient antenatal care, third trimester: Secondary | ICD-10-CM

## 2018-05-19 DIAGNOSIS — Z131 Encounter for screening for diabetes mellitus: Secondary | ICD-10-CM

## 2018-05-19 DIAGNOSIS — Z3A28 28 weeks gestation of pregnancy: Secondary | ICD-10-CM

## 2018-05-19 DIAGNOSIS — O0990 Supervision of high risk pregnancy, unspecified, unspecified trimester: Secondary | ICD-10-CM

## 2018-05-19 DIAGNOSIS — O0932 Supervision of pregnancy with insufficient antenatal care, second trimester: Secondary | ICD-10-CM | POA: Insufficient documentation

## 2018-05-19 DIAGNOSIS — Z369 Encounter for antenatal screening, unspecified: Secondary | ICD-10-CM

## 2018-05-19 NOTE — Patient Instructions (Signed)
Third Trimester of Pregnancy The third trimester is from week 28 through week 40 (months 7 through 9). The third trimester is a time when the unborn baby (fetus) is growing rapidly. At the end of the ninth month, the fetus is about 20 inches in length and weighs 6-10 pounds. Body changes during your third trimester Your body will continue to go through many changes during pregnancy. The changes vary from woman to woman. During the third trimester:  Your weight will continue to increase. You can expect to gain 25-35 pounds (11-16 kg) by the end of the pregnancy.  You may begin to get stretch marks on your hips, abdomen, and breasts.  You may urinate more often because the fetus is moving lower into your pelvis and pressing on your bladder.  You may develop or continue to have heartburn. This is caused by increased hormones that slow down muscles in the digestive tract.  You may develop or continue to have constipation because increased hormones slow digestion and cause the muscles that push waste through your intestines to relax.  You may develop hemorrhoids. These are swollen veins (varicose veins) in the rectum that can itch or be painful.  You may develop swollen, bulging veins (varicose veins) in your legs.  You may have increased body aches in the pelvis, back, or thighs. This is due to weight gain and increased hormones that are relaxing your joints.  You may have changes in your hair. These can include thickening of your hair, rapid growth, and changes in texture. Some women also have hair loss during or after pregnancy, or hair that feels dry or thin. Your hair will most likely return to normal after your baby is born.  Your breasts will continue to grow and they will continue to become tender. A yellow fluid (colostrum) may leak from your breasts. This is the first milk you are producing for your baby.  Your belly button may stick out.  You may notice more swelling in your hands,  face, or ankles.  You may have increased tingling or numbness in your hands, arms, and legs. The skin on your belly may also feel numb.  You may feel short of breath because of your expanding uterus.  You may have more problems sleeping. This can be caused by the size of your belly, increased need to urinate, and an increase in your body's metabolism.  You may notice the fetus "dropping," or moving lower in your abdomen (lightening).  You may have increased vaginal discharge.  You may notice your joints feel loose and you may have pain around your pelvic bone. What to expect at prenatal visits You will have prenatal exams every 2 weeks until week 36. Then you will have weekly prenatal exams. During a routine prenatal visit:  You will be weighed to make sure you and the baby are growing normally.  Your blood pressure will be taken.  Your abdomen will be measured to track your baby's growth.  The fetal heartbeat will be listened to.  Any test results from the previous visit will be discussed.  You may have a cervical check near your due date to see if your cervix has softened or thinned (effaced).  You will be tested for Group B streptococcus. This happens between 35 and 37 weeks. Your health care provider may ask you:  What your birth plan is.  How you are feeling.  If you are feeling the baby move.  If you have had any abnormal   symptoms, such as leaking fluid, bleeding, severe headaches, or abdominal cramping.  If you are using any tobacco products, including cigarettes, chewing tobacco, and electronic cigarettes.  If you have any questions. Other tests or screenings that may be performed during your third trimester include:  Blood tests that check for low iron levels (anemia).  Fetal testing to check the health, activity level, and growth of the fetus. Testing is done if you have certain medical conditions or if there are problems during the pregnancy.  Nonstress test  (NST). This test checks the health of your baby to make sure there are no signs of problems, such as the baby not getting enough oxygen. During this test, a belt is placed around your belly. The baby is made to move, and its heart rate is monitored during movement. What is false labor? False labor is a condition in which you feel small, irregular tightenings of the muscles in the womb (contractions) that usually go away with rest, changing position, or drinking water. These are called Braxton Hicks contractions. Contractions may last for hours, days, or even weeks before true labor sets in. If contractions come at regular intervals, become more frequent, increase in intensity, or become painful, you should see your health care provider. What are the signs of labor?  Abdominal cramps.  Regular contractions that start at 10 minutes apart and become stronger and more frequent with time.  Contractions that start on the top of the uterus and spread down to the lower abdomen and back.  Increased pelvic pressure and dull back pain.  A watery or bloody mucus discharge that comes from the vagina.  Leaking of amniotic fluid. This is also known as your "water breaking." It could be a slow trickle or a gush. Let your health care provider know if it has a color or strange odor. If you have any of these signs, call your health care provider right away, even if it is before your due date. Follow these instructions at home: Medicines  Follow your health care provider's instructions regarding medicine use. Specific medicines may be either safe or unsafe to take during pregnancy.  Take a prenatal vitamin that contains at least 600 micrograms (mcg) of folic acid.  If you develop constipation, try taking a stool softener if your health care provider approves. Eating and drinking   Eat a balanced diet that includes fresh fruits and vegetables, whole grains, good sources of protein such as meat, eggs, or tofu,  and low-fat dairy. Your health care provider will help you determine the amount of weight gain that is right for you.  Avoid raw meat and uncooked cheese. These carry germs that can cause birth defects in the baby.  If you have low calcium intake from food, talk to your health care provider about whether you should take a daily calcium supplement.  Eat four or five small meals rather than three large meals a day.  Limit foods that are high in fat and processed sugars, such as fried and sweet foods.  To prevent constipation: ? Drink enough fluid to keep your urine clear or pale yellow. ? Eat foods that are high in fiber, such as fresh fruits and vegetables, whole grains, and beans. Activity  Exercise only as directed by your health care provider. Most women can continue their usual exercise routine during pregnancy. Try to exercise for 30 minutes at least 5 days a week. Stop exercising if you experience uterine contractions.  Avoid heavy lifting.  Do   not exercise in extreme heat or humidity, or at high altitudes.  Wear low-heel, comfortable shoes.  Practice good posture.  You may continue to have sex unless your health care provider tells you otherwise. Relieving pain and discomfort  Take frequent breaks and rest with your legs elevated if you have leg cramps or low back pain.  Take warm sitz baths to soothe any pain or discomfort caused by hemorrhoids. Use hemorrhoid cream if your health care provider approves.  Wear a good support bra to prevent discomfort from breast tenderness.  If you develop varicose veins: ? Wear support pantyhose or compression stockings as told by your healthcare provider. ? Elevate your feet for 15 minutes, 3-4 times a day. Prenatal care  Write down your questions. Take them to your prenatal visits.  Keep all your prenatal visits as told by your health care provider. This is important. Safety  Wear your seat belt at all times when driving.  Make  a list of emergency phone numbers, including numbers for family, friends, the hospital, and police and fire departments. General instructions  Avoid cat litter boxes and soil used by cats. These carry germs that can cause birth defects in the baby. If you have a cat, ask someone to clean the litter box for you.  Do not travel far distances unless it is absolutely necessary and only with the approval of your health care provider.  Do not use hot tubs, steam rooms, or saunas.  Do not drink alcohol.  Do not use any products that contain nicotine or tobacco, such as cigarettes and e-cigarettes. If you need help quitting, ask your health care provider.  Do not use any medicinal herbs or unprescribed drugs. These chemicals affect the formation and growth of the baby.  Do not douche or use tampons or scented sanitary pads.  Do not cross your legs for long periods of time.  To prepare for the arrival of your baby: ? Take prenatal classes to understand, practice, and ask questions about labor and delivery. ? Make a trial run to the hospital. ? Visit the hospital and tour the maternity area. ? Arrange for maternity or paternity leave through employers. ? Arrange for family and friends to take care of pets while you are in the hospital. ? Purchase a rear-facing car seat and make sure you know how to install it in your car. ? Pack your hospital bag. ? Prepare the baby's nursery. Make sure to remove all pillows and stuffed animals from the baby's crib to prevent suffocation.  Visit your dentist if you have not gone during your pregnancy. Use a soft toothbrush to brush your teeth and be gentle when you floss. Contact a health care provider if:  You are unsure if you are in labor or if your water has broken.  You become dizzy.  You have mild pelvic cramps, pelvic pressure, or nagging pain in your abdominal area.  You have lower back pain.  You have persistent nausea, vomiting, or  diarrhea.  You have an unusual or bad smelling vaginal discharge.  You have pain when you urinate. Get help right away if:  Your water breaks before 37 weeks.  You have regular contractions less than 5 minutes apart before 37 weeks.  You have a fever.  You are leaking fluid from your vagina.  You have spotting or bleeding from your vagina.  You have severe abdominal pain or cramping.  You have rapid weight loss or weight gain.  You have   shortness of breath with chest pain.  You notice sudden or extreme swelling of your face, hands, ankles, feet, or legs.  Your baby makes fewer than 10 movements in 2 hours.  You have severe headaches that do not go away when you take medicine.  You have vision changes. Summary  The third trimester is from week 28 through week 40, months 7 through 9. The third trimester is a time when the unborn baby (fetus) is growing rapidly.  During the third trimester, your discomfort may increase as you and your baby continue to gain weight. You may have abdominal, leg, and back pain, sleeping problems, and an increased need to urinate.  During the third trimester your breasts will keep growing and they will continue to become tender. A yellow fluid (colostrum) may leak from your breasts. This is the first milk you are producing for your baby.  False labor is a condition in which you feel small, irregular tightenings of the muscles in the womb (contractions) that eventually go away. These are called Braxton Hicks contractions. Contractions may last for hours, days, or even weeks before true labor sets in.  Signs of labor can include: abdominal cramps; regular contractions that start at 10 minutes apart and become stronger and more frequent with time; watery or bloody mucus discharge that comes from the vagina; increased pelvic pressure and dull back pain; and leaking of amniotic fluid. This information is not intended to replace advice given to you by your  health care provider. Make sure you discuss any questions you have with your health care provider. Document Released: 02/24/2001 Document Revised: 04/07/2016 Document Reviewed: 04/07/2016 Elsevier Interactive Patient Education  2019 [employer]vier Inc.  

## 2018-05-19 NOTE — Progress Notes (Signed)
05/19/2018   Chief Complaint: Missed period  Transfer of Care Patient: yes, from [place] where she was living until Dec, slow to get transfer of care due to delay in Medicaid coverage  History of Present Illness: [name] is a 23 y.o. [id number] [redacted]w[redacted]d based on Patient's last menstrual period was 11/02/2017. with an Estimated Date of Delivery: 08/09/18, with the above CC. She has had a relatively normal prenatal course although limited visits in [place] and here.  Some nausea and heartburn.  On any different medications around the time she conceived/early pregnancy: Yes - she is on Celexa and was on Seroquel until this week for which she stopped, she has schizoaffective disorder. History of varicella: Yes   ROS: A 12-point review of systems was performed and negative, except as stated in the above HPI.  OBGYN History: As per HPI. OB History  Gravida Para Term Preterm AB Living  4 2 2  0 1 2  SAB TAB Ectopic Multiple Live Births  1 0 0 0 2    # Outcome Date GA Lbr Len/2nd Weight Sex Delivery Anes PTL Lv  4 Current           3 Term 05/07/16 [redacted]w[redacted]d  6 lb 9.8 oz (3 kg) M Vag-Spont None  LIV  2 SAB           1 Term      Vag-Spont   LIV  Prior NSVD x2, [facility].  History of fast labor   Past Medical History: Past Medical History:  Diagnosis Date  . Anemia   . Anemia   . Anxiety   . Asthma   . Bipolar 1 disorder (HCC)   . Chronic back pain   . Depression   . Insomnia   . Personality disorder (HCC)    "boarderline"  . PTSD (post-traumatic stress disorder)     Past Surgical History: Past Surgical History:  Procedure Laterality Date  . BUNIONECTOMY Bilateral    feet  . wrist sugery      Family History:  Family History  Problem Relation Age of Onset  . Diabetes Mother   . Diabetes Maternal Grandmother   . Heart disease Maternal Grandmother   . Cancer Neg Hx   . Ovarian cancer Neg Hx    She denies any female cancers, bleeding or blood clotting disorders.  She denies any history  of mental retardation, birth defects or genetic disorders in her or the FOB's history  Social History:  Social History   Socioeconomic History  . Marital status: Single    Spouse name: Not on file  . Number of children: Not on file  . Years of education: Not on file  . Highest education level: Not on file  Occupational History  . Not on file  Social Needs  . Financial resource strain: Not on file  . Food insecurity:    Worry: Not on file    Inability: Not on file  . Transportation needs:    Medical: Not on file    Non-medical: Not on file  Tobacco Use  . Smoking status: Former Smoker    Types: Cigarettes    Last attempt to quit: 08/13/2015    Years since quitting: 2.7  . Smokeless tobacco: Never Used  Substance and Sexual Activity  . Alcohol use: No  . Drug use: Yes    Frequency: 7.0 times per week    Types: Marijuana  . Sexual activity: Yes  Lifestyle  . Physical activity:  Days per week: Not on file    Minutes per session: Not on file  . Stress: Not on file  Relationships  . Social connections:    Talks on phone: Not on file    Gets together: Not on file    Attends religious service: Not on file    Active member of club or organization: Not on file    Attends meetings of clubs or organizations: Not on file    Relationship status: Not on file  . Intimate partner violence:    Fear of current or ex partner: Not on file    Emotionally abused: Not on file    Physically abused: Not on file    Forced sexual activity: Not on file  Other Topics Concern  . Not on file  Social History Narrative  . Not on file   Any pets in the household: no  Allergy: Allergies  Allergen Reactions  . Penicillins Hives    Has patient had a PCN reaction causing immediate rash, facial/tongue/throat swelling, SOB or lightheadedness with hypotension: No Has patient had a PCN reaction causing severe rash involving mucus membranes or skin necrosis: No Has patient had a PCN reaction  that required hospitalization No Has patient had a PCN reaction occurring within the last 10 years: No If all of the above answers are "NO", then may proceed with Cephalosporin use.  . Rocephin [Ceftriaxone] Hives  . Zithromax [Azithromycin] Itching and Nausea And Vomiting  . Lidocaine Hives, Itching and Rash    Current Outpatient Medications:  Current Outpatient Medications:  .  citalopram (CELEXA) 10 MG tablet, Take 1 tablet (10 mg total) by mouth daily. (Patient not taking: Reported on 05/19/2018), Disp: 30 tablet, Rfl: 1 .  cyclobenzaprine (FLEXERIL) 10 MG tablet, Take 1 tablet (10 mg total) by mouth 3 (three) times daily as needed. (Patient not taking: Reported on 05/19/2018), Disp: 90 tablet, Rfl: 1 .  docusate sodium (COLACE) 100 MG capsule, Take 2 capsules (200 mg total) by mouth 2 (two) times daily. (Patient not taking: Reported on 05/19/2018), Disp: 120 capsule, Rfl: 1 .  famotidine (PEPCID) 20 MG tablet, Take 1 tablet (20 mg total) by mouth 2 (two) times daily. (Patient not taking: Reported on 05/19/2018), Disp: 60 tablet, Rfl: 1 .  ferrous sulfate 325 (65 FE) MG tablet, Take 1 tablet (325 mg total) by mouth daily with breakfast. (Patient not taking: Reported on 05/19/2018), Disp: 30 tablet, Rfl: 1 .  fluticasone (FLONASE) 50 MCG/ACT nasal spray, Place 1 spray into both nostrils 2 (two) times daily. (Patient not taking: Reported on 05/19/2018), Disp: 16 g, Rfl: 1 .  folic acid (FOLVITE) 1 MG tablet, Take 1 tablet (1 mg total) by mouth daily. (Patient not taking: Reported on 05/19/2018), Disp: 30 tablet, Rfl: 1 .  loratadine (CLARITIN) 10 MG tablet, Take 1 tablet (10 mg total) by mouth daily. (Patient not taking: Reported on 05/19/2018), Disp: 30 tablet, Rfl: 1 .  promethazine (PHENERGAN) 25 MG tablet, Take 1 tablet (25 mg total) by mouth every 6 (six) hours as needed for nausea or vomiting. (Patient not taking: Reported on 05/19/2018), Disp: 60 tablet, Rfl: 1 .  traZODone (DESYREL) 150 MG tablet, Take 1  tablet (150 mg total) by mouth at bedtime. (Patient not taking: Reported on 05/19/2018), Disp: 30 tablet, Rfl: 1   Physical Exam:   BP 110/70   Wt 184 lb (83.5 kg)   LMP 11/02/2017   Breastfeeding Unknown   BMI 33.65 kg/m  Body mass  index is 33.65 kg/m. Constitutional: Well nourished, well developed female in no acute distress.  Neck:  Supple, normal appearance, and no thyromegaly  Cardiovascular: S1, S2 normal, no murmur, rub or gallop, regular rate and rhythm Respiratory:  Clear to auscultation bilateral. Normal respiratory effort Abdomen: positive bowel sounds and no masses, hernias; diffusely non tender to palpation, non distended Breasts: breasts appear normal, no suspicious masses, no skin or nipple changes or axillary nodes. Neuro/Psych:  Normal mood and affect.  Skin:  Warm and dry.  Lymphatic:  No inguinal lymphadenopathy.   Pelvic exam deferred, will assess what she has had done in [place]  Assessment: Ms. Reid is a 23 y.o. [id number] [redacted]w[redacted]d based on Patient's last menstrual period was 11/02/2017. with an Estimated Date of Delivery: 08/09/18,  for prenatal care.  Plan:  1) Avoid alcoholic beverages. 2) Patient encouraged not to smoke.  3) Discontinue the use of all non-medicinal drugs and chemicals.  4) Take prenatal vitamins daily.  5) Seatbelt use advised 6) Nutrition, food safety (fish, cheese advisories, and high nitrite foods) and exercise discussed. 7) Hospital and practice style delivering at [facility] discussed  8) Patient is asked about travel to areas at risk for the Zika virus, and counseled to avoid travel and exposure to mosquitoes or sexual partners who may have themselves been exposed to the virus. Testing is discussed, and will be ordered as appropriate.   Obtain records from prenatal care in [place] Anatomy US scheduled Glucola nv Known O+ blood type Problem list reviewed and updated. Plans to breast feed Desires PP BTL.  Counseled young age and risk of regret.   Sign papers nv. Risks of Celexa discussed.  She will continue, but wishes to stay off of Seroquel.  Risks of psychiatric exacerbation discussed.  Clinic [facility] Prenatal Labs  Dating [place] Blood type:   O+  Genetic Screen In [place], reported normal Antibody:   Anatomic [place] scheduled Rubella:   Varicella: @  GTT Early:    WNL   Third trimester:  RPR:     Rhogam na HBsAg:     TDaP vaccine         nv     Flu Shot: offered HIV:     Baby Food      Breast                          GBS: pending  Contraception      BTL Pap:2018   Annamarie Major, MD, [facility], [facility] 05/19/2018  2:54 PM

## 2018-05-23 ENCOUNTER — Observation Stay
Admission: EM | Admit: 2018-05-23 | Discharge: 2018-05-23 | Disposition: A | Discharge status: Home / Self Care | Payer: Medicaid Other | Attending: Obstetrics and Gynecology | Admitting: Obstetrics and Gynecology

## 2018-05-23 ENCOUNTER — Other Ambulatory Visit: Payer: Self-pay

## 2018-05-23 DIAGNOSIS — Z3A28 28 weeks gestation of pregnancy: Secondary | ICD-10-CM | POA: Insufficient documentation

## 2018-05-23 DIAGNOSIS — D649 Anemia, unspecified: Secondary | ICD-10-CM | POA: Diagnosis not present

## 2018-05-23 DIAGNOSIS — O99013 Anemia complicating pregnancy, third trimester: Secondary | ICD-10-CM | POA: Diagnosis not present

## 2018-05-23 DIAGNOSIS — G43909 Migraine, unspecified, not intractable, without status migrainosus: Secondary | ICD-10-CM | POA: Insufficient documentation

## 2018-05-23 DIAGNOSIS — O99353 Diseases of the nervous system complicating pregnancy, third trimester: Secondary | ICD-10-CM | POA: Diagnosis not present

## 2018-05-23 DIAGNOSIS — O26893 Other specified pregnancy related conditions, third trimester: Secondary | ICD-10-CM | POA: Diagnosis not present

## 2018-05-23 DIAGNOSIS — O368330 Maternal care for abnormalities of the fetal heart rate or rhythm, third trimester, not applicable or unspecified: Secondary | ICD-10-CM | POA: Insufficient documentation

## 2018-05-23 DIAGNOSIS — R51 Headache: Secondary | ICD-10-CM | POA: Diagnosis present

## 2018-05-23 LAB — COMPREHENSIVE METABOLIC PANEL
ALT: 16 U/L (ref 0–44)
AST: 22 U/L (ref 15–41)
Albumin: 2.8 g/dL — ABNORMAL LOW (ref 3.5–5.0)
Alkaline Phosphatase: 63 U/L (ref 38–126)
Anion gap: 8 (ref 5–15)
BILIRUBIN TOTAL: 0.6 mg/dL (ref 0.3–1.2)
BUN: 10 mg/dL (ref 6–20)
CO2: 19 mmol/L — ABNORMAL LOW (ref 22–32)
Calcium: 8.1 mg/dL — ABNORMAL LOW (ref 8.9–10.3)
Chloride: 107 mmol/L (ref 98–111)
Creatinine, Ser: 0.65 mg/dL (ref 0.44–1.00)
GFR calc Af Amer: >60 mL/min (ref >60)
GFR calc non Af Amer: >60 mL/min (ref >60)
Glucose, Bld: 90 mg/dL (ref 70–99)
Potassium: 3.8 mmol/L (ref 3.5–5.1)
Sodium: 134 mmol/L — ABNORMAL LOW (ref 135–145)
Total Protein: 6.4 g/dL — ABNORMAL LOW (ref 6.5–8.1)

## 2018-05-23 LAB — CBC
HEMATOCRIT: 23.4 % — AB (ref 36.0–46.0)
Hemoglobin: 6.8 g/dL — ABNORMAL LOW (ref 12.0–15.0)
MCH: 21.7 pg — ABNORMAL LOW (ref 26.0–34.0)
MCHC: 29.1 g/dL — AB (ref 30.0–36.0)
MCV: 74.8 fL — ABNORMAL LOW (ref 80.0–100.0)
Platelets: 224 10*3/uL (ref 150–400)
RBC: 3.13 MIL/uL — ABNORMAL LOW (ref 3.87–5.11)
RDW: 17.9 % — ABNORMAL HIGH (ref 11.5–15.5)
WBC: 8.8 10*3/uL (ref 4.0–10.5)
nRBC: 0.3 % — ABNORMAL HIGH (ref 0.0–0.2)

## 2018-05-23 LAB — PROTEIN / CREATININE RATIO, URINE
Creatinine, Urine: 172 mg/dL
Protein Creatinine Ratio: 0.1 mg/mg{Cre} (ref 0.00–0.15)
TOTAL PROTEIN, URINE: 17 mg/dL

## 2018-05-23 MED ORDER — PROCHLORPERAZINE MALEATE 10 MG PO TABS
10.0000 mg | ORAL_TABLET | Freq: Four times a day (QID) | ORAL | Status: DC | PRN
  Administered 2018-05-23: 10 mg via ORAL
  Filled 2018-05-23 (×2): qty 1

## 2018-05-23 MED ORDER — BUTALBITAL-APAP-CAFFEINE 50-325-40 MG PO TABS: 1.0000 | ORAL_TABLET | Freq: Four times a day (QID) | ORAL | 0 refills | Status: DC | PRN

## 2018-05-23 NOTE — Final Progress Note (Signed)
Physician Final Progress Note  Patient ID: Kristy Reid MRN: [id number] DOB/AGE: [birth year] 22 y.o.  Admit date: 05/23/2018 Admitting provider: Vena Austria, MD Discharge date: 05/23/2018   Admission Diagnoses:   Indication for care in labor and delivery, antepartum  Discharge Diagnoses: Migraine headache, anemia  History of Present Illness: The patient is a 23 y.o. female [phone] at [redacted]w[redacted]d who presents for a strong headache today (rates pain 10/10), present around her right eye. It is unrelieved by Tylenol. She also has with blurred vision, floaters in her vision, and feels lightheaded. Some intermittent pain in her back since last night. No epigastric pain. No dysuria. Denies vaginal bleeding, contractions, and loss of fluid. Baby is moving well.   Review of Systems: Review of systems negative unless otherwise noted in HPI.   Past Medical History:  Diagnosis Date  . Anemia   . Anemia   . Anxiety   . Asthma   . Bipolar 1 disorder (HCC)   . Chronic back pain   . Depression   . Insomnia   . Personality disorder (HCC)    "boarderline"  . PTSD (post-traumatic stress disorder)     Past Surgical History:  Procedure Laterality Date  . BUNIONECTOMY Bilateral    feet  . wrist sugery      No current facility-administered medications on file prior to encounter.    Current Outpatient Medications on File Prior to Encounter  Medication Sig Dispense Refill  . citalopram (CELEXA) 10 MG tablet Take 1 tablet (10 mg total) by mouth daily. 30 tablet 1  . cyclobenzaprine (FLEXERIL) 10 MG tablet Take 1 tablet (10 mg total) by mouth 3 (three) times daily as needed. 90 tablet 1  . docusate sodium (COLACE) 100 MG capsule Take 2 capsules (200 mg total) by mouth 2 (two) times daily. 120 capsule 1  . famotidine (PEPCID) 20 MG tablet Take 1 tablet (20 mg total) by mouth 2 (two) times daily. 60 tablet 1  . ferrous sulfate 325 (65 FE) MG tablet Take 1 tablet (325 mg total) by mouth daily with  breakfast. 30 tablet 1  . fluticasone (FLONASE) 50 MCG/ACT nasal spray Place 1 spray into both nostrils 2 (two) times daily. 16 g 1  . folic acid (FOLVITE) 1 MG tablet Take 1 tablet (1 mg total) by mouth daily. 30 tablet 1  . loratadine (CLARITIN) 10 MG tablet Take 1 tablet (10 mg total) by mouth daily. 30 tablet 1  . promethazine (PHENERGAN) 25 MG tablet Take 1 tablet (25 mg total) by mouth every 6 (six) hours as needed for nausea or vomiting. 60 tablet 1  . traZODone (DESYREL) 150 MG tablet Take 1 tablet (150 mg total) by mouth at bedtime. 30 tablet 1    Allergies  Allergen Reactions  . Penicillins Hives    Has patient had a PCN reaction causing immediate rash, facial/tongue/throat swelling, SOB or lightheadedness with hypotension: No Has patient had a PCN reaction causing severe rash involving mucus membranes or skin necrosis: No Has patient had a PCN reaction that required hospitalization No Has patient had a PCN reaction occurring within the last 10 years: No If all of the above answers are "NO", then may proceed with Cephalosporin use.  . Rocephin [Ceftriaxone] Hives  . Zithromax [Azithromycin] Itching and Nausea And Vomiting  . Lidocaine Hives, Itching and Rash    Social History   Socioeconomic History  . Marital status: Single    Spouse name: Not on file  . Number of  children: 2  . Years of education: Not on file  . Highest education level: Not on file  Occupational History  . Not on file  Social Needs  . Financial resource strain: Not hard at all  . Food insecurity:    Worry: Never true    Inability: Never true  . Transportation needs:    Medical: No    Non-medical: No  Tobacco Use  . Smoking status: Former Smoker    Types: Cigarettes    Last attempt to quit: 08/13/2015    Years since quitting: 2.7  . Smokeless tobacco: Never Used  Substance and Sexual Activity  . Alcohol use: No  . Drug use: Yes    Frequency: 7.0 times per week    Types: Marijuana     Comment: daily use  . Sexual activity: Yes    Comment: undecided  Lifestyle  . Physical activity:    Days per week: 3 days    Minutes per session: 30 min  . Stress: Rather much  Relationships  . Social connections:    Talks on phone: More than three times a week    Gets together: More than three times a week    Attends religious service: Never    Active member of club or organization: No    Attends meetings of clubs or organizations: Never    Relationship status: Living with partner  . Intimate partner violence:    Fear of current or ex partner: Not on file    Emotionally abused: Not on file    Physically abused: Not on file    Forced sexual activity: Not on file  Other Topics Concern  . Not on file  Social History Narrative  . Not on file    Family history:  Family History  Problem Relation Age of Onset  . Diabetes Mother   . Diabetes Maternal Grandmother   . Heart disease Maternal Grandmother   . Cancer Neg Hx   . Ovarian cancer Neg Hx     Physical Exam: BP 115/61   Pulse 93   Temp 98.4 F (36.9 C) (Oral)   Resp 18   Ht 5\' 2"  (1.575 m)   Wt 84.8 kg   LMP 11/02/2017   BMI 34.20 kg/m   Gen: NAD CV: Regular rate Pulm: No increased work of breathing Pelvic: deferred Neuro: Alert and oriented, normal reflexes  NST Baseline: 140 Variability: moderate Accelerations: present Decelerations: occasional variable, good return to baseline Tocometry: none The patient was monitored for >30 minutes, fetal heart rate tracing was deemed reactive.  Consults: None  Significant Findings/ Diagnostic Studies: labs: CBC shows anemia. Normal platelet count, liver enzymes, and PC ratio  Procedures: NST reactive  Discharge Condition: stable  Disposition: Discharge disposition: 01-Home or Self Care       Diet: Regular diet  Discharge Activity: Activity as tolerated  Discharge Instructions    Discharge activity:  No Restrictions   Complete by:  As directed     Discharge diet:  No restrictions   Complete by:  As directed    No sexual activity restrictions   Complete by:  As directed    Notify physician for a general feeling that "something is not right"   Complete by:  As directed    Notify physician for increase or change in vaginal discharge   Complete by:  As directed    Notify physician for intestinal cramps, with or without diarrhea, sometimes described as "gas pain"   Complete by:  As directed    Notify physician for leaking of fluid   Complete by:  As directed    Notify physician for low, dull backache, unrelieved by heat or Tylenol   Complete by:  As directed    Notify physician for menstrual like cramps   Complete by:  As directed    Notify physician for pelvic pressure   Complete by:  As directed    Notify physician for uterine contractions.  These may be painless and feel like the uterus is tightening or the baby is  "balling up"   Complete by:  As directed    Notify physician for vaginal bleeding   Complete by:  As directed    PRETERM LABOR:  Includes any of the follwing symptoms that occur between 20 - [redacted] weeks gestation.  If these symptoms are not stopped, preterm labor can result in preterm delivery, placing your baby at risk   Complete by:  As directed      Allergies as of 05/23/2018      Reactions   Penicillins Hives   Has patient had a PCN reaction causing immediate rash, facial/tongue/throat swelling, SOB or lightheadedness with hypotension: No Has patient had a PCN reaction causing severe rash involving mucus membranes or skin necrosis: No Has patient had a PCN reaction that required hospitalization No Has patient had a PCN reaction occurring within the last 10 years: No If all of the above answers are "NO", then may proceed with Cephalosporin use.   Rocephin [ceftriaxone] Hives   Zithromax [azithromycin] Itching, Nausea And Vomiting   Lidocaine Hives, Itching, Rash      Medication List    TAKE these medications    butalbital-acetaminophen-caffeine 50-325-40 MG tablet Commonly known as:  FIORICET, ESGIC Take 1-2 tablets by mouth every 6 (six) hours as needed for headache.   citalopram 10 MG tablet Commonly known as:  CELEXA Take 1 tablet (10 mg total) by mouth daily.   cyclobenzaprine 10 MG tablet Commonly known as:  FLEXERIL Take 1 tablet (10 mg total) by mouth 3 (three) times daily as needed.   docusate sodium 100 MG capsule Commonly known as:  COLACE Take 2 capsules (200 mg total) by mouth 2 (two) times daily.   famotidine 20 MG tablet Commonly known as:  PEPCID Take 1 tablet (20 mg total) by mouth 2 (two) times daily.   ferrous sulfate 325 (65 FE) MG tablet Take 1 tablet (325 mg total) by mouth daily with breakfast.   fluticasone 50 MCG/ACT nasal spray Commonly known as:  FLONASE Place 1 spray into both nostrils 2 (two) times daily.   folic acid 1 MG tablet Commonly known as:  FOLVITE Take 1 tablet (1 mg total) by mouth daily.   loratadine 10 MG tablet Commonly known as:  CLARITIN Take 1 tablet (10 mg total) by mouth daily.   promethazine 25 MG tablet Commonly known as:  PHENERGAN Take 1 tablet (25 mg total) by mouth every 6 (six) hours as needed for nausea or vomiting.   traZODone 150 MG tablet Commonly known as:  DESYREL Take 1 tablet (150 mg total) by mouth at bedtime.      Pre-eclampsia labs negative and all BP readings were normotensive. Most likely migraine headache. Outpatient consult to hematology for anemia.  Signed: Oswaldo Conroy, CNM  05/23/2018

## 2018-05-23 NOTE — OB Triage Note (Signed)
Pt. presented to L/D triage with reported headache and vision changes. She rates headache 10/10. It is located in right eye. She is also experiencing blurry vision, floaters, and light-headedness. Headache was unrelieved by 1000 mg tylenol at home. Positive fetal movement noted No bleeding or LOF. The patient is experiencing some irregular lower back pain that began last night. No urinary symptoms. Last B/P 112/66. VSS. Will continue to monitor.

## 2018-05-23 NOTE — Discharge Summary (Signed)
See Final Progress Note 05/23/2018.

## 2018-05-24 ENCOUNTER — Telehealth: Payer: Self-pay

## 2018-05-24 NOTE — Telephone Encounter (Signed)
Pt c/o trouble breathing; inhaler not helping; get SOB c lying down, turning over, walking, any movement.  [phone]  Pt states sinuses may be al little congested not much.  Said AMS was going to try to get her set up for hematology infusion - that it might help c SOB.  Pt also states she has some chest pain.  Adv per policy if chest pain and SOB to go to ED.

## 2018-05-25 ENCOUNTER — Other Ambulatory Visit: Payer: Self-pay | Admitting: Maternal Newborn

## 2018-05-25 DIAGNOSIS — O99013 Anemia complicating pregnancy, third trimester: Secondary | ICD-10-CM

## 2018-05-25 NOTE — Progress Notes (Signed)
Hematology consult ordered.

## 2018-05-26 ENCOUNTER — Encounter: Payer: Self-pay | Admitting: *Deleted

## 2018-05-26 ENCOUNTER — Emergency Department: Payer: Medicaid Other

## 2018-05-26 ENCOUNTER — Other Ambulatory Visit: Payer: Self-pay

## 2018-05-26 ENCOUNTER — Emergency Department
Admission: EM | Admit: 2018-05-26 | Discharge: 2018-05-26 | Disposition: A | Discharge status: Home / Self Care | Payer: Medicaid Other | Attending: Emergency Medicine | Admitting: Emergency Medicine

## 2018-05-26 DIAGNOSIS — Z87891 Personal history of nicotine dependence: Secondary | ICD-10-CM | POA: Insufficient documentation

## 2018-05-26 DIAGNOSIS — Z79899 Other long term (current) drug therapy: Secondary | ICD-10-CM | POA: Diagnosis not present

## 2018-05-26 DIAGNOSIS — O9989 Other specified diseases and conditions complicating pregnancy, childbirth and the puerperium: Secondary | ICD-10-CM | POA: Diagnosis present

## 2018-05-26 DIAGNOSIS — D649 Anemia, unspecified: Secondary | ICD-10-CM

## 2018-05-26 DIAGNOSIS — J3489 Other specified disorders of nose and nasal sinuses: Secondary | ICD-10-CM | POA: Insufficient documentation

## 2018-05-26 DIAGNOSIS — Z3A29 29 weeks gestation of pregnancy: Secondary | ICD-10-CM | POA: Diagnosis not present

## 2018-05-26 DIAGNOSIS — R05 Cough: Secondary | ICD-10-CM | POA: Insufficient documentation

## 2018-05-26 DIAGNOSIS — R0602 Shortness of breath: Secondary | ICD-10-CM | POA: Diagnosis not present

## 2018-05-26 DIAGNOSIS — R0981 Nasal congestion: Secondary | ICD-10-CM | POA: Insufficient documentation

## 2018-05-26 DIAGNOSIS — O99013 Anemia complicating pregnancy, third trimester: Secondary | ICD-10-CM | POA: Insufficient documentation

## 2018-05-26 DIAGNOSIS — O219 Vomiting of pregnancy, unspecified: Secondary | ICD-10-CM | POA: Diagnosis not present

## 2018-05-26 DIAGNOSIS — J45909 Unspecified asthma, uncomplicated: Secondary | ICD-10-CM | POA: Insufficient documentation

## 2018-05-26 DIAGNOSIS — R6883 Chills (without fever): Secondary | ICD-10-CM | POA: Insufficient documentation

## 2018-05-26 DIAGNOSIS — R079 Chest pain, unspecified: Secondary | ICD-10-CM

## 2018-05-26 LAB — BASIC METABOLIC PANEL
Anion gap: 8 (ref 5–15)
BUN: 9 mg/dL (ref 6–20)
CALCIUM: 8.7 mg/dL — AB (ref 8.9–10.3)
CHLORIDE: 108 mmol/L (ref 98–111)
CO2: 19 mmol/L — ABNORMAL LOW (ref 22–32)
CREATININE: 0.66 mg/dL (ref 0.44–1.00)
GFR calc Af Amer: >60 mL/min (ref >60)
GFR calc non Af Amer: >60 mL/min (ref >60)
Glucose, Bld: 112 mg/dL — ABNORMAL HIGH (ref 70–99)
Potassium: 3.7 mmol/L (ref 3.5–5.1)
Sodium: 135 mmol/L (ref 135–145)

## 2018-05-26 LAB — INFLUENZA PANEL BY PCR (TYPE A & B)
INFLBPCR: NEGATIVE
Influenza A By PCR: NEGATIVE

## 2018-05-26 LAB — CBC
HEMATOCRIT: 26.1 % — AB (ref 36.0–46.0)
Hemoglobin: 7.6 g/dL — ABNORMAL LOW (ref 12.0–15.0)
MCH: 21.7 pg — ABNORMAL LOW (ref 26.0–34.0)
MCHC: 29.1 g/dL — ABNORMAL LOW (ref 30.0–36.0)
MCV: 74.6 fL — AB (ref 80.0–100.0)
PLATELETS: 217 10*3/uL (ref 150–400)
RBC: 3.5 MIL/uL — ABNORMAL LOW (ref 3.87–5.11)
RDW: 18.2 % — ABNORMAL HIGH (ref 11.5–15.5)
WBC: 9.6 10*3/uL (ref 4.0–10.5)
nRBC: 0.4 % — ABNORMAL HIGH (ref 0.0–0.2)

## 2018-05-26 LAB — TROPONIN I: Troponin I: <0.03 ng/mL (ref <0.03)

## 2018-05-26 LAB — FIBRIN DERIVATIVES D-DIMER (ARMC ONLY): Fibrin derivatives D-dimer (ARMC): 931.65 ng/mL (FEU) — ABNORMAL HIGH (ref 0.00–499.00)

## 2018-05-26 MED ORDER — IOHEXOL 350 MG/ML SOLN
75.0000 mL | Freq: Once | INTRAVENOUS | Status: AC | PRN
  Administered 2018-05-26: 75 mL via INTRAVENOUS

## 2018-05-26 MED ORDER — IPRATROPIUM-ALBUTEROL 0.5-2.5 (3) MG/3ML IN SOLN
3.0000 mL | Freq: Once | RESPIRATORY_TRACT | Status: AC
  Administered 2018-05-26: 3 mL via RESPIRATORY_TRACT
  Filled 2018-05-26: qty 3

## 2018-05-26 MED ORDER — SODIUM CHLORIDE 0.9% FLUSH
3.0000 mL | Freq: Once | INTRAVENOUS | Status: AC
  Administered 2018-05-26: 3 mL via INTRAVENOUS

## 2018-05-26 MED ORDER — HYDROCORTISONE NA SUCCINATE PF 100 MG IJ SOLR
100.0000 mg | Freq: Once | INTRAMUSCULAR | Status: AC
  Administered 2018-05-26: 100 mg via INTRAVENOUS
  Filled 2018-05-26: qty 2

## 2018-05-26 NOTE — Discharge Instructions (Signed)
Your work-up was essentially unremarkable with no evidence of pneumonia, flu, fluid in the lungs, heart problems, or blood clots.  You are anemic but not worse than your baseline.  Continue to take iron supplementation as prescribed.  Follow-up with your hematologist next week.  Return to the emergency room for new or worsening chest pain or shortness of breath, fever chills, vaginal bleeding, abdominal pain.

## 2018-05-26 NOTE — ED Notes (Signed)
Pt given another warm blanket.  

## 2018-05-26 NOTE — ED Triage Notes (Signed)
Pt reports chest pain.  Sx for 2 weeks.  Pt is [redacted] weeks pregnant.  No abd pain.  No vag bleeding.  Pt states it hurts to take a deep breath.  Pt has nj/v.  Pt treated at [facility] for pregnancy.  Pt seen there last week.  Pt alert.

## 2018-05-26 NOTE — ED Notes (Signed)
Pt continues to rest comfortably in bed.  

## 2018-05-26 NOTE — ED Notes (Signed)
Grn and Blue tops sent to lab. Pt to imaging now.

## 2018-05-26 NOTE — ED Provider Notes (Signed)
[facility] Emergency Department Provider Note  ____________________________________________  Time seen: Approximately 6:45 PM  I have reviewed the triage vital signs and the nursing notes.   HISTORY  Chief Complaint Chest Pain   HPI Nikyla Myeesha Bourgoin is a 23 y.o. female with history of anemia, bipolar disorder, asthma, currently at [redacted] weeks gestational age who presents for evaluation of chest pain.  Patient reports 4 to 5 days of severe constant chest tightness associated with shortness of breath both at rest and with minimal exertion, congestion, dry cough, runny nose, nausea, and vomiting. Several daily episodes of NBNB emesis. No vaginal bleeding, no abdominal pain.  She has been using her inhaler at home with no success.  She denies any fever but has had chills.  Patient denies any personal or family history of blood clots, recent travel or immobilization, leg pain, hemoptysis or exogenous hormones.  Past Medical History:  Diagnosis Date   Anemia    Anemia    Anxiety    Asthma    Bipolar 1 disorder (HCC)    Chronic back pain    Depression    Insomnia    Personality disorder (HCC)    "boarderline"   PTSD (post-traumatic stress disorder)     Patient Active Problem List   Diagnosis Date Noted   Limited prenatal care in second trimester 05/19/2018   Depression with suicidal ideation 05/04/2018   Alcohol abuse 05/04/2018   Schizoaffective disorder, bipolar type (HCC) 05/04/2018   Anxiety disorder    Bipolar 2 disorder (HCC) 11/11/2016   Noncompliance 11/11/2016   Severe recurrent major depression with psychotic features (HCC) 07/18/2016   MDD (major depressive disorder), recurrent, severe, with psychosis (HCC) 07/17/2016   Normal labor 05/07/2016   Labor and delivery, indication for care 04/19/2016   Indication for care in labor and delivery, antepartum 04/06/2016   Decreased fetal movement 03/22/2016   Abdominal pain  in pregnancy 01/21/2016   Constipation during pregnancy in second trimester 12/26/2015   Overdose 11/29/2015   Supervision of high risk pregnancy, antepartum 11/15/2015   Cannabis use disorder, severe, dependence (HCC) 11/15/2015   Tobacco use disorder 11/15/2015   Asthma 11/15/2015   Borderline personality disorder (HCC) 11/15/2015   PTSD (post-traumatic stress disorder) 11/15/2015   Severe recurrent major depression without psychotic features (HCC) 11/13/2015    Past Surgical History:  Procedure Laterality Date   BUNIONECTOMY Bilateral    feet   wrist sugery      Prior to Admission medications   Medication Sig Start Date End Date Taking? Authorizing Provider  butalbital-acetaminophen-caffeine (FIORICET, ESGIC) [phone] MG tablet Take 1-2 tablets by mouth every 6 (six) hours as needed for headache. 05/23/18 05/23/19 Yes Oswaldo Conroy, CNM  citalopram (CELEXA) 10 MG tablet Take 1 tablet (10 mg total) by mouth daily. 05/06/18  Yes [name], [name], MD  cyclobenzaprine (FLEXERIL) 10 MG tablet Take 1 tablet (10 mg total) by mouth 3 (three) times daily as needed. 05/05/18  Yes [name], [name], MD  docusate sodium (COLACE) 100 MG capsule Take 2 capsules (200 mg total) by mouth 2 (two) times daily. 05/05/18  Yes [name], [name], MD  famotidine (PEPCID) 20 MG tablet Take 1 tablet (20 mg total) by mouth 2 (two) times daily. 05/05/18  Yes [name], [name], MD  ferrous sulfate 325 (65 FE) MG tablet Take 1 tablet (325 mg total) by mouth daily with breakfast. 05/05/18  Yes [name], [name], MD  fluticasone (FLONASE) 50 MCG/ACT nasal  spray Place 1 spray into both nostrils 2 (two) times daily. 05/05/18  Yes [name], [name], MD  folic acid (FOLVITE) 1 MG tablet Take 1 tablet (1 mg total) by mouth daily. 05/05/18  Yes [name], [name], MD  loratadine (CLARITIN) 10 MG tablet Take 1 tablet (10 mg total) by mouth daily. 05/05/18  Yes [name], [name], MD  promethazine (PHENERGAN) 25 MG tablet Take 1 tablet  (25 mg total) by mouth every 6 (six) hours as needed for nausea or vomiting. 05/05/18  Yes [name], [name], MD  traZODone (DESYREL) 150 MG tablet Take 1 tablet (150 mg total) by mouth at bedtime. 05/05/18  Yes [name], [name], MD    Allergies Penicillins; Rocephin [ceftriaxone]; Zithromax [azithromycin]; and Lidocaine  Family History  Problem Relation Age of Onset   Diabetes Mother    Diabetes Maternal Grandmother    Heart disease Maternal Grandmother    Cancer Neg Hx    Ovarian cancer Neg Hx     Social History Social History   Tobacco Use   Smoking status: Former Smoker    Types: Cigarettes    Last attempt to quit: 08/13/2015    Years since quitting: 2.7   Smokeless tobacco: Never Used  Substance Use Topics   Alcohol use: No   Drug use: Yes    Frequency: 7.0 times per week    Types: Marijuana    Comment: daily use    Review of Systems  Constitutional: Negative for fever. + chills Eyes: Negative for visual changes. ENT: Negative for sore throat. + congestion Neck: No neck pain  Cardiovascular: + chest pain. Respiratory: + shortness of breath, cough Gastrointestinal: Negative for abdominal pain,  Diarrhea. + N/V Genitourinary: Negative for dysuria. Musculoskeletal: Negative for back pain. Skin: Negative for rash. Neurological: Negative for headaches, weakness or numbness. Psych: No SI or HI  ____________________________________________   PHYSICAL EXAM:  VITAL SIGNS: ED Triage Vitals  Enc Vitals Group     BP 05/26/18 1513 112/72     Pulse Rate 05/26/18 1513 (!) 110     Resp 05/26/18 1513 20     Temp 05/26/18 1513 98.2 F (36.8 C)     Temp Source 05/26/18 1513 Oral     SpO2 05/26/18 1513 100 %     Weight 05/26/18 1514 187 lb (84.8 kg)     Height 05/26/18 1514  (1.575 m)     Head Circumference --      Peak Flow --      Pain Score 05/26/18 1514 8     Pain Loc --      Pain Edu? --      Excl. in GC? --     Constitutional: Alert and  oriented. Well appearing and in no apparent distress. HEENT:      Head: Normocephalic and atraumatic.         Eyes: Conjunctivae are normal. Sclera is non-icteric.       Mouth/Throat: Mucous membranes are moist.       Neck: Supple with no signs of meningismus. Cardiovascular: Tachycardic with regular rhythm. No murmurs, gallops, or rubs. 2+ symmetrical distal pulses are present in all extremities. No JVD. Respiratory: Severely diminished air movement bilaterally with no wheezing or crackles, normal work of breathing and normal sats Gastrointestinal: Soft, non tender, and non distended with positive bowel sounds. No rebound or guarding. Musculoskeletal: Nontender with normal range of motion in all extremities. No edema, cyanosis, or erythema of extremities. Neurologic: Normal speech  and language. Face is symmetric. Moving all extremities. No gross focal neurologic deficits are appreciated. Skin: Skin is warm, dry and intact. No rash noted. Psychiatric: Mood and affect are normal. Speech and behavior are normal.  ____________________________________________   LABS (all labs ordered are listed, but only abnormal results are displayed)  Labs Reviewed  BASIC METABOLIC PANEL - Abnormal; Notable for the following components:      Result Value   CO2 19 (*)    Glucose, Bld 112 (*)    Calcium 8.7 (*)    All other components within normal limits  CBC - Abnormal; Notable for the following components:   RBC 3.50 (*)    Hemoglobin 7.6 (*)    HCT 26.1 (*)    MCV 74.6 (*)    MCH 21.7 (*)    MCHC 29.1 (*)    RDW 18.2 (*)    nRBC 0.4 (*)    All other components within normal limits  FIBRIN DERIVATIVES D-DIMER ([facility] ONLY) - Abnormal; Notable for the following components:   Fibrin derivatives D-dimer ([facility]) 931.65 (*)    All other components within normal limits  TROPONIN I  INFLUENZA PANEL BY PCR (TYPE A & B)   ____________________________________________  EKG  ED ECG REPORT I, Nita Sickle, the attending physician, personally viewed and interpreted this ECG.  Sinus tachycardia, rate of 116, normal intervals, normal axis, no ST elevations or depressions, T wave flattening in inferior lateral leads.  Unchanged from prior. ____________________________________________  RADIOLOGY  I have personally reviewed the images performed during this visit and I agree with the Radiologist's read.   Interpretation by Radiologist:  Dg Chest 2 View  Result Date: 05/26/2018 CLINICAL DATA:  Generalized chest pain and shortness of breath for 2 days. Twenty-nine weeks pregnant. Former smoker. EXAM: CHEST - 2 VIEW COMPARISON:  04/27/2018 FINDINGS: The heart size and mediastinal contours are within normal limits. Both lungs are clear. The visualized skeletal structures are unremarkable. IMPRESSION: No active cardiopulmonary disease. Electronically Signed   By: Burman Nieves M.D.   On: 05/26/2018 19:12   Ct Angio Chest Pe W And/or Wo Contrast  Result Date: 05/26/2018 CLINICAL DATA:  Chest pain x2 weeks. EXAM: CT ANGIOGRAPHY CHEST WITH CONTRAST TECHNIQUE: Multidetector CT imaging of the chest was performed using the standard protocol during bolus administration of intravenous contrast. Multiplanar CT image reconstructions and MIPs were obtained to evaluate the vascular anatomy. CONTRAST:  6mL OMNIPAQUE IOHEXOL 350 MG/ML SOLN COMPARISON:  Chest CT 08/31/2015 FINDINGS: Cardiovascular: The study is of quality for the evaluation of pulmonary embolism. There are no filling defects in the central, lobar, segmental or subsegmental pulmonary artery branches to suggest acute pulmonary embolism. Great vessels are normal in course and caliber. Normal heart size. No significant pericardial fluid/thickening. Mediastinum/Nodes: No discrete thyroid nodules. Unremarkable esophagus. There is a small hiatal hernia. No pathologically enlarged axillary, mediastinal or hilar lymph nodes. Lungs/Pleura: No pneumothorax.  No pleural effusion. Passive atelectasis at the lung bases. Upper abdomen: Unremarkable. Musculoskeletal:  No aggressive appearing focal osseous lesions. Review of the MIP images confirms the above findings. IMPRESSION: No acute pulmonary embolus.  No active pulmonary disease. Electronically Signed   By: Tollie Eth M.D.   On: 05/26/2018 22:07   US Venous Img Lower Bilateral  Result Date: 05/26/2018 CLINICAL DATA:  Chest pain and shortness of breath for 2 days. Twenty-nine weeks pregnant. EXAM: BILATERAL LOWER EXTREMITY VENOUS DOPPLER ULTRASOUND TECHNIQUE: Gray-scale sonography with graded compression, as well as color Doppler and  duplex ultrasound were performed to evaluate the lower extremity deep venous systems from the level of the common femoral vein and including the common femoral, femoral, profunda femoral, popliteal and calf veins including the posterior tibial, peroneal and gastrocnemius veins when visible. The superficial great saphenous vein was also interrogated. Spectral Doppler was utilized to evaluate flow at rest and with distal augmentation maneuvers in the common femoral, femoral and popliteal veins. COMPARISON:  None. FINDINGS: RIGHT LOWER EXTREMITY Common Femoral Vein: No evidence of thrombus. Normal compressibility, respiratory phasicity and response to augmentation. Saphenofemoral Junction: No evidence of thrombus. Normal compressibility and flow on color Doppler imaging. Profunda Femoral Vein: No evidence of thrombus. Normal compressibility and flow on color Doppler imaging. Femoral Vein: No evidence of thrombus. Normal compressibility, respiratory phasicity and response to augmentation. Popliteal Vein: No evidence of thrombus. Normal compressibility, respiratory phasicity and response to augmentation. Calf Veins: No evidence of thrombus. Normal compressibility and flow on color Doppler imaging. Superficial Great Saphenous Vein: No evidence of thrombus. Normal compressibility. Venous  Reflux:  None. Other Findings:  None. LEFT LOWER EXTREMITY Common Femoral Vein: No evidence of thrombus. Normal compressibility, respiratory phasicity and response to augmentation. Saphenofemoral Junction: No evidence of thrombus. Normal compressibility and flow on color Doppler imaging. Profunda Femoral Vein: No evidence of thrombus. Normal compressibility and flow on color Doppler imaging. Femoral Vein: No evidence of thrombus. Normal compressibility, respiratory phasicity and response to augmentation. Popliteal Vein: No evidence of thrombus. Normal compressibility, respiratory phasicity and response to augmentation. Calf Veins: No evidence of thrombus. Normal compressibility and flow on color Doppler imaging. Superficial Great Saphenous Vein: No evidence of thrombus. Normal compressibility. Venous Reflux:  None. Other Findings:  None. IMPRESSION: No evidence of deep venous thrombosis in either lower extremity. Electronically Signed   By: Burman Nieves M.D.   On: 05/26/2018 21:17      ____________________________________________   PROCEDURES  Procedure(s) performed: None Procedures Critical Care performed:  None ____________________________________________   INITIAL IMPRESSION / ASSESSMENT AND PLAN / ED COURSE   23 y.o. female with history of anemia, bipolar disorder, asthma, currently at [redacted] weeks gestational age who presents for evaluation of chest pain, SOB, cough, congestion, rhinorrhea, nausea, vomiting, and chills.  Patient is well-appearing with normal work of breathing, no distress, no pitting edema, lungs are very tight with decreased air movement, no crackles or wheezes.  At this time presentation is most likely concerning for an asthma exacerbation in the setting of a viral URI vs PNA vs Flu.  The fact the patient is pregnant increases her chance of having a PE as well. Will treat with duoneb and solumedrol and get CXR. If chest x-ray is within normal limits and patient symptoms do  not improve with an asthma exacerbation treatment will proceed with evaluation for PE.  Clinical Course as of May 26 2230  Thu May 26, 2018  2003 After 3 DuoNeb's and Solu-Medrol patient reports no change in her symptoms.  We will send a d-dimer and Doppler ultrasound of bilateral lower extremity to evaluate for possible PE.   [CV]  2135 Doppler negative for DVT.  Patient remains tachycardic in the low 100s.  Denies any improvement of her symptoms.  D-dimer is elevated.  Discussed risks and benefits of CT angios and patient would like to undergo CT at this time.   [CV]  2229 CTA negative for any acute findings.  Bedside ultrasound showing normal fetal movement with fetal heart rate of 136.  Patient has an appointment  in 5 days with hematology.  At this time her hemoglobin is within her baseline no indication for transfusion.  Discussed standard return precautions and close follow-up.   [CV]    Clinical Course User Index [CV] Don Perking [place], MD     As part of my medical decision making, I reviewed the following data within the electronic MEDICAL RECORD NUMBER Nursing notes reviewed and incorporated, Labs reviewed , EKG interpreted , Old EKG reviewed, Old chart reviewed, Radiograph reviewed , Notes from prior ED visits and [place] Controlled Substance Database    Pertinent labs & imaging results that were available during my care of the patient were reviewed by me and considered in my medical decision making (see chart for details).    ____________________________________________   FINAL CLINICAL IMPRESSION(S) / ED DIAGNOSES  Final diagnoses:  Chest pain, unspecified type  Shortness of breath  Anemia, unspecified type      NEW MEDICATIONS STARTED DURING THIS VISIT:  ED Discharge Orders    None       Note:  This document was prepared using Dragon voice recognition software and may include unintentional dictation errors.    Nita Sickle, MD 05/26/18 2233

## 2018-05-26 NOTE — ED Notes (Signed)
Sent green and purple to lab. 

## 2018-05-26 NOTE — ED Notes (Signed)
Pt states her SOB feels the same as earlier today. Doesn't feel that the breathing tx helped.

## 2018-05-30 DIAGNOSIS — O99013 Anemia complicating pregnancy, third trimester: Secondary | ICD-10-CM | POA: Insufficient documentation

## 2018-05-30 NOTE — Progress Notes (Deleted)
[facility]  [phone] [phone]  ID: Jarold Song OB: Mar 23, 1995  MR#: [id number]  VOJ#:[id number]  Patient Care Team: Patient, No Pcp Per as PCP - General (General Practice)  CHIEF COMPLAINT: Anemia affecting pregnancy in the third trimester.  INTERVAL HISTORY: ***  REVIEW OF SYSTEMS:   ROS  As per HPI. Otherwise, a complete review of systems is negative.  PAST MEDICAL HISTORY: Past Medical History:  Diagnosis Date  . Anemia   . Anemia   . Anxiety   . Asthma   . Bipolar 1 disorder (HCC)   . Chronic back pain   . Depression   . Insomnia   . Personality disorder (HCC)    "boarderline"  . PTSD (post-traumatic stress disorder)     PAST SURGICAL HISTORY: Past Surgical History:  Procedure Laterality Date  . BUNIONECTOMY Bilateral    feet  . wrist sugery      FAMILY HISTORY: Family History  Problem Relation Age of Onset  . Diabetes Mother   . Diabetes Maternal Grandmother   . Heart disease Maternal Grandmother   . Cancer Neg Hx   . Ovarian cancer Neg Hx     ADVANCED DIRECTIVES (Y/N):  N  HEALTH MAINTENANCE: Social History   Tobacco Use  . Smoking status: Former Smoker    Types: Cigarettes    Last attempt to quit: 08/13/2015    Years since quitting: 2.7  . Smokeless tobacco: Never Used  Substance Use Topics  . Alcohol use: No  . Drug use: Yes    Frequency: 7.0 times per week    Types: Marijuana    Comment: daily use     Colonoscopy:  PAP:  Bone density:  Lipid panel:  Allergies  Allergen Reactions  . Penicillins Hives    Has patient had a PCN reaction causing immediate rash, facial/tongue/throat swelling, SOB or lightheadedness with hypotension: No Has patient had a PCN reaction causing severe rash involving mucus membranes or skin necrosis: No Has patient had a PCN reaction that required hospitalization No Has patient had a PCN reaction occurring within the last 10 years: No If all of the above  answers are "NO", then may proceed with Cephalosporin use.  . Rocephin [Ceftriaxone] Hives  . Zithromax [Azithromycin] Itching and Nausea And Vomiting  . Lidocaine Hives, Itching and Rash    Current Outpatient Medications  Medication Sig Dispense Refill  . butalbital-acetaminophen-caffeine (FIORICET, ESGIC) 50-325-40 MG tablet Take 1-2 tablets by mouth every 6 (six) hours as needed for headache. 20 tablet 0  . citalopram (CELEXA) 10 MG tablet Take 1 tablet (10 mg total) by mouth daily. 30 tablet 1  . cyclobenzaprine (FLEXERIL) 10 MG tablet Take 1 tablet (10 mg total) by mouth 3 (three) times daily as needed. 90 tablet 1  . docusate sodium (COLACE) 100 MG capsule Take 2 capsules (200 mg total) by mouth 2 (two) times daily. 120 capsule 1  . famotidine (PEPCID) 20 MG tablet Take 1 tablet (20 mg total) by mouth 2 (two) times daily. 60 tablet 1  . ferrous sulfate 325 (65 FE) MG tablet Take 1 tablet (325 mg total) by mouth daily with breakfast. 30 tablet 1  . fluticasone (FLONASE) 50 MCG/ACT nasal spray Place 1 spray into both nostrils 2 (two) times daily. 16 g 1  . folic acid (FOLVITE) 1 MG tablet Take 1 tablet (1 mg total) by mouth daily. 30 tablet 1  . loratadine (CLARITIN) 10 MG tablet Take 1 tablet (10 mg  total) by mouth daily. 30 tablet 1  . promethazine (PHENERGAN) 25 MG tablet Take 1 tablet (25 mg total) by mouth every 6 (six) hours as needed for nausea or vomiting. 60 tablet 1  . traZODone (DESYREL) 150 MG tablet Take 1 tablet (150 mg total) by mouth at bedtime. 30 tablet 1   No current facility-administered medications for this visit.     OBJECTIVE: There were no vitals filed for this visit.   There is no height or weight on file to calculate BMI.    ECOG FS:{CHL ONC [id number]  General: Well-developed, well-nourished, no acute distress. Eyes: Pink conjunctiva, anicteric sclera. HEENT: Normocephalic, moist mucous membranes, clear oropharnyx. Lungs: Clear to auscultation  bilaterally. Heart: Regular rate and rhythm. No rubs, murmurs, or gallops. Abdomen: Soft, nontender, nondistended. No organomegaly noted, normoactive bowel sounds. Musculoskeletal: No edema, cyanosis, or clubbing. Neuro: Alert, answering all questions appropriately. Cranial nerves grossly intact. Skin: No rashes or petechiae noted. Psych: Normal affect. Lymphatics: No cervical, calvicular, axillary or inguinal LAD.   LAB RESULTS:  Lab Results  Component Value Date   NA 135 05/26/2018   K 3.7 05/26/2018   CL 108 05/26/2018   CO2 19 (L) 05/26/2018   GLUCOSE 112 (H) 05/26/2018   BUN 9 05/26/2018   CREATININE 0.66 05/26/2018   CALCIUM 8.7 (L) 05/26/2018   PROT 6.4 (L) 05/23/2018   ALBUMIN 2.8 (L) 05/23/2018   AST 22 05/23/2018   ALT 16 05/23/2018   ALKPHOS 63 05/23/2018   BILITOT 0.6 05/23/2018   GFRNONAA >60 05/26/2018   GFRAA >60 05/26/2018    Lab Results  Component Value Date   WBC 9.6 05/26/2018   NEUTROABS 4.0 05/03/2018   HGB 7.6 (L) 05/26/2018   HCT 26.1 (L) 05/26/2018   MCV 74.6 (L) 05/26/2018   PLT 217 05/26/2018     STUDIES: Dg Chest 2 View  Result Date: 05/26/2018 CLINICAL DATA:  Generalized chest pain and shortness of breath for 2 days. Twenty-nine weeks pregnant. Former smoker. EXAM: CHEST - 2 VIEW COMPARISON:  04/27/2018 FINDINGS: The heart size and mediastinal contours are within normal limits. Both lungs are clear. The visualized skeletal structures are unremarkable. IMPRESSION: No active cardiopulmonary disease. Electronically Signed   By: Burman Nieves M.D.   On: 05/26/2018 19:12   Ct Angio Chest Pe W And/or Wo Contrast  Result Date: 05/26/2018 CLINICAL DATA:  Chest pain x2 weeks. EXAM: CT ANGIOGRAPHY CHEST WITH CONTRAST TECHNIQUE: Multidetector CT imaging of the chest was performed using the standard protocol during bolus administration of intravenous contrast. Multiplanar CT image reconstructions and MIPs were obtained to evaluate the vascular  anatomy. CONTRAST:  2mL OMNIPAQUE IOHEXOL 350 MG/ML SOLN COMPARISON:  Chest CT 08/31/2015 FINDINGS: Cardiovascular: The study is of quality for the evaluation of pulmonary embolism. There are no filling defects in the central, lobar, segmental or subsegmental pulmonary artery branches to suggest acute pulmonary embolism. Great vessels are normal in course and caliber. Normal heart size. No significant pericardial fluid/thickening. Mediastinum/Nodes: No discrete thyroid nodules. Unremarkable esophagus. There is a small hiatal hernia. No pathologically enlarged axillary, mediastinal or hilar lymph nodes. Lungs/Pleura: No pneumothorax. No pleural effusion. Passive atelectasis at the lung bases. Upper abdomen: Unremarkable. Musculoskeletal:  No aggressive appearing focal osseous lesions. Review of the MIP images confirms the above findings. IMPRESSION: No acute pulmonary embolus.  No active pulmonary disease. Electronically Signed   By: Tollie Eth M.D.   On: 05/26/2018 22:07   US Venous Img Lower Bilateral  Result  Date: 05/26/2018 CLINICAL DATA:  Chest pain and shortness of breath for 2 days. Twenty-nine weeks pregnant. EXAM: BILATERAL LOWER EXTREMITY VENOUS DOPPLER ULTRASOUND TECHNIQUE: Gray-scale sonography with graded compression, as well as color Doppler and duplex ultrasound were performed to evaluate the lower extremity deep venous systems from the level of the common femoral vein and including the common femoral, femoral, profunda femoral, popliteal and calf veins including the posterior tibial, peroneal and gastrocnemius veins when visible. The superficial great saphenous vein was also interrogated. Spectral Doppler was utilized to evaluate flow at rest and with distal augmentation maneuvers in the common femoral, femoral and popliteal veins. COMPARISON:  None. FINDINGS: RIGHT LOWER EXTREMITY Common Femoral Vein: No evidence of thrombus. Normal compressibility, respiratory phasicity and response to  augmentation. Saphenofemoral Junction: No evidence of thrombus. Normal compressibility and flow on color Doppler imaging. Profunda Femoral Vein: No evidence of thrombus. Normal compressibility and flow on color Doppler imaging. Femoral Vein: No evidence of thrombus. Normal compressibility, respiratory phasicity and response to augmentation. Popliteal Vein: No evidence of thrombus. Normal compressibility, respiratory phasicity and response to augmentation. Calf Veins: No evidence of thrombus. Normal compressibility and flow on color Doppler imaging. Superficial Great Saphenous Vein: No evidence of thrombus. Normal compressibility. Venous Reflux:  None. Other Findings:  None. LEFT LOWER EXTREMITY Common Femoral Vein: No evidence of thrombus. Normal compressibility, respiratory phasicity and response to augmentation. Saphenofemoral Junction: No evidence of thrombus. Normal compressibility and flow on color Doppler imaging. Profunda Femoral Vein: No evidence of thrombus. Normal compressibility and flow on color Doppler imaging. Femoral Vein: No evidence of thrombus. Normal compressibility, respiratory phasicity and response to augmentation. Popliteal Vein: No evidence of thrombus. Normal compressibility, respiratory phasicity and response to augmentation. Calf Veins: No evidence of thrombus. Normal compressibility and flow on color Doppler imaging. Superficial Great Saphenous Vein: No evidence of thrombus. Normal compressibility. Venous Reflux:  None. Other Findings:  None. IMPRESSION: No evidence of deep venous thrombosis in either lower extremity. Electronically Signed   By: Burman Nieves M.D.   On: 05/26/2018 21:17    ASSESSMENT: Anemia affecting pregnancy in the third trimester.  PLAN:    1. Anemia affecting pregnancy in the third trimester:  Patient expressed understanding and was in agreement with this plan. She also understands that She can call clinic at any time with any questions, concerns, or  complaints.   Cancer Staging No matching staging information was found for the patient.  Jeralyn Ruths, MD   05/30/2018 7:51 AM

## 2018-05-31 ENCOUNTER — Inpatient Hospital Stay: Payer: Medicaid Other | Admitting: Oncology

## 2018-06-01 NOTE — Progress Notes (Signed)
[facility]  [phone] [phone]  ID: Kristy Reid OB: [birth year]  MR#: [id number]  AOZ#:[id number]  Patient Care Team: Patient, No Pcp Per as PCP - General (General Practice)  CHIEF COMPLAINT: Anemia affecting pregnancy in the third trimester.  INTERVAL HISTORY: Patient is a 23 year old female in the third trimester of pregnancy who was noted to have severe iron deficiency anemia.  Patient reports that during a previous pregnancy in 2015 she became severely anemic requiring blood transfusion.  Currently, she has weakness and fatigue but otherwise feels well.  She reports dyspnea on exertion.  She has no neurologic complaints.  She denies any recent fevers or illnesses.  She has good appetite and is gaining weight appropriately.  She has no chest pain, cough, or shortness of breath.  She denies any nausea, vomiting, constipation, or diarrhea.  She has no melena or hematochezia.  She has no urinary complaints.  Patient otherwise feels well and offers no further specific complaints today.  REVIEW OF SYSTEMS:   Review of Systems  Constitutional: Negative.  Negative for fever, malaise/fatigue and weight loss.  Respiratory: Negative.  Negative for cough, hemoptysis and shortness of breath.   Cardiovascular: Negative.  Negative for chest pain and leg swelling.  Gastrointestinal: Negative.  Negative for abdominal pain, blood in stool and melena.  Genitourinary: Negative.  Negative for hematuria.  Musculoskeletal: Negative.  Negative for back pain.  Skin: Negative.  Negative for rash.  Neurological: Negative.  Negative for focal weakness, weakness and headaches.  Psychiatric/Behavioral: Negative.  The patient is not nervous/anxious.     As per HPI. Otherwise, a complete review of systems is negative.  PAST MEDICAL HISTORY: Past Medical History:  Diagnosis Date  . Anemia   . Anemia   . Anxiety   . Asthma   . Bipolar 1 disorder (HCC)   .  Chronic back pain   . Depression   . Insomnia   . Personality disorder (HCC)    "boarderline"  . PTSD (post-traumatic stress disorder)     PAST SURGICAL HISTORY: Past Surgical History:  Procedure Laterality Date  . BUNIONECTOMY Bilateral    feet  . wrist sugery      FAMILY HISTORY: Family History  Problem Relation Age of Onset  . Diabetes Mother   . Hypertension Mother   . Mental illness Mother   . Diabetes Maternal Grandmother   . Heart disease Maternal Grandmother   . Cancer Neg Hx   . Ovarian cancer Neg Hx     ADVANCED DIRECTIVES (Y/N):  N  HEALTH MAINTENANCE: Social History   Tobacco Use  . Smoking status: Former Smoker    Types: Cigarettes    Last attempt to quit: 08/13/2015    Years since quitting: 2.8  . Smokeless tobacco: Never Used  Substance Use Topics  . Alcohol use: No  . Drug use: Yes    Frequency: 7.0 times per week    Types: Marijuana    Comment: daily use     Colonoscopy:  PAP:  Bone density:  Lipid panel:  Allergies  Allergen Reactions  . Penicillins Hives    Has patient had a PCN reaction causing immediate rash, facial/tongue/throat swelling, SOB or lightheadedness with hypotension: No Has patient had a PCN reaction causing severe rash involving mucus membranes or skin necrosis: No Has patient had a PCN reaction that required hospitalization No Has patient had a PCN reaction occurring within the last 10 years: No If all of the above answers  are "NO", then may proceed with Cephalosporin use.  . Rocephin [Ceftriaxone] Hives  . Zithromax [Azithromycin] Itching and Nausea And Vomiting  . Lidocaine Hives, Itching and Rash    Current Outpatient Medications  Medication Sig Dispense Refill  . butalbital-acetaminophen-caffeine (FIORICET, ESGIC) 50-325-40 MG tablet Take 1-2 tablets by mouth every 6 (six) hours as needed for headache. 20 tablet 0  . citalopram (CELEXA) 10 MG tablet Take 1 tablet (10 mg total) by mouth daily. 30 tablet 1  .  cyclobenzaprine (FLEXERIL) 10 MG tablet Take 1 tablet (10 mg total) by mouth 3 (three) times daily as needed. 90 tablet 1  . docusate sodium (COLACE) 100 MG capsule Take 2 capsules (200 mg total) by mouth 2 (two) times daily. 120 capsule 1  . famotidine (PEPCID) 20 MG tablet Take 1 tablet (20 mg total) by mouth 2 (two) times daily. 60 tablet 1  . ferrous sulfate 325 (65 FE) MG tablet Take 1 tablet (325 mg total) by mouth daily with breakfast. 30 tablet 1  . fluticasone (FLONASE) 50 MCG/ACT nasal spray Place 1 spray into both nostrils 2 (two) times daily. 16 g 1  . folic acid (FOLVITE) 1 MG tablet Take 1 tablet (1 mg total) by mouth daily. 30 tablet 1  . loratadine (CLARITIN) 10 MG tablet Take 1 tablet (10 mg total) by mouth daily. 30 tablet 1  . Prenat-Fe Poly-Methfol-FA-DHA (VITAFOL FE+) 90-0.6-0.4-200 MG CAPS Take 1 tablet by mouth daily. 30 capsule 11  . promethazine (PHENERGAN) 25 MG tablet Take 1 tablet (25 mg total) by mouth every 6 (six) hours as needed for nausea or vomiting. 60 tablet 1  . traZODone (DESYREL) 150 MG tablet Take 1 tablet (150 mg total) by mouth at bedtime. 30 tablet 1   No current facility-administered medications for this visit.     OBJECTIVE: Vitals:   06/02/18 1506  BP: (!) 127/56  Pulse: (!) 108  Resp: 18  Temp: 97.7 F (36.5 C)  SpO2: 99%     Body mass index is 33.65 kg/m.    ECOG FS:0 - Asymptomatic  General: Well-developed, well-nourished, no acute distress. Eyes: Pink conjunctiva, anicteric sclera. HEENT: Normocephalic, moist mucous membranes, clear oropharnyx. Lungs: Clear to auscultation bilaterally. Heart: Regular rate and rhythm. No rubs, murmurs, or gallops. Abdomen: Appears appropriate for gestational age. Musculoskeletal: No edema, cyanosis, or clubbing. Neuro: Alert, answering all questions appropriately. Cranial nerves grossly intact. Skin: No rashes or petechiae noted. Psych: Normal affect. Lymphatics: No cervical, calvicular, axillary or  inguinal LAD.   LAB RESULTS:  Lab Results  Component Value Date   NA 135 05/26/2018   K 3.7 05/26/2018   CL 108 05/26/2018   CO2 19 (L) 05/26/2018   GLUCOSE 112 (H) 05/26/2018   BUN 9 05/26/2018   CREATININE 0.66 05/26/2018   CALCIUM 8.7 (L) 05/26/2018   PROT 6.4 (L) 05/23/2018   ALBUMIN 2.8 (L) 05/23/2018   AST 22 05/23/2018   ALT 16 05/23/2018   ALKPHOS 63 05/23/2018   BILITOT 0.6 05/23/2018   GFRNONAA >60 05/26/2018   GFRAA >60 05/26/2018    Lab Results  Component Value Date   WBC 8.1 06/02/2018   NEUTROABS 4.0 05/03/2018   HGB 7.6 (L) 06/02/2018   HCT 25.1 (L) 06/02/2018   MCV 73.2 (L) 06/02/2018   PLT 214 06/02/2018   Lab Results  Component Value Date   IRON 28 06/02/2018   TIBC 636 (H) 06/02/2018   IRONPCTSAT 4 (L) 06/02/2018   Lab Results  Component Value Date   FERRITIN 4 (L) 06/02/2018     STUDIES: Dg Chest 2 View  Result Date: 05/26/2018 CLINICAL DATA:  Generalized chest pain and shortness of breath for 2 days. Twenty-nine weeks pregnant. Former smoker. EXAM: CHEST - 2 VIEW COMPARISON:  04/27/2018 FINDINGS: The heart size and mediastinal contours are within normal limits. Both lungs are clear. The visualized skeletal structures are unremarkable. IMPRESSION: No active cardiopulmonary disease. Electronically Signed   By: Burman Nieves M.D.   On: 05/26/2018 19:12   Ct Angio Chest Pe W And/or Wo Contrast  Result Date: 05/26/2018 CLINICAL DATA:  Chest pain x2 weeks. EXAM: CT ANGIOGRAPHY CHEST WITH CONTRAST TECHNIQUE: Multidetector CT imaging of the chest was performed using the standard protocol during bolus administration of intravenous contrast. Multiplanar CT image reconstructions and MIPs were obtained to evaluate the vascular anatomy. CONTRAST:  86mL OMNIPAQUE IOHEXOL 350 MG/ML SOLN COMPARISON:  Chest CT 08/31/2015 FINDINGS: Cardiovascular: The study is of quality for the evaluation of pulmonary embolism. There are no filling defects in the central,  lobar, segmental or subsegmental pulmonary artery branches to suggest acute pulmonary embolism. Great vessels are normal in course and caliber. Normal heart size. No significant pericardial fluid/thickening. Mediastinum/Nodes: No discrete thyroid nodules. Unremarkable esophagus. There is a small hiatal hernia. No pathologically enlarged axillary, mediastinal or hilar lymph nodes. Lungs/Pleura: No pneumothorax. No pleural effusion. Passive atelectasis at the lung bases. Upper abdomen: Unremarkable. Musculoskeletal:  No aggressive appearing focal osseous lesions. Review of the MIP images confirms the above findings. IMPRESSION: No acute pulmonary embolus.  No active pulmonary disease. Electronically Signed   By: Tollie Eth M.D.   On: 05/26/2018 22:07   US Venous Img Lower Bilateral  Result Date: 05/26/2018 CLINICAL DATA:  Chest pain and shortness of breath for 2 days. Twenty-nine weeks pregnant. EXAM: BILATERAL LOWER EXTREMITY VENOUS DOPPLER ULTRASOUND TECHNIQUE: Gray-scale sonography with graded compression, as well as color Doppler and duplex ultrasound were performed to evaluate the lower extremity deep venous systems from the level of the common femoral vein and including the common femoral, femoral, profunda femoral, popliteal and calf veins including the posterior tibial, peroneal and gastrocnemius veins when visible. The superficial great saphenous vein was also interrogated. Spectral Doppler was utilized to evaluate flow at rest and with distal augmentation maneuvers in the common femoral, femoral and popliteal veins. COMPARISON:  None. FINDINGS: RIGHT LOWER EXTREMITY Common Femoral Vein: No evidence of thrombus. Normal compressibility, respiratory phasicity and response to augmentation. Saphenofemoral Junction: No evidence of thrombus. Normal compressibility and flow on color Doppler imaging. Profunda Femoral Vein: No evidence of thrombus. Normal compressibility and flow on color Doppler imaging. Femoral  Vein: No evidence of thrombus. Normal compressibility, respiratory phasicity and response to augmentation. Popliteal Vein: No evidence of thrombus. Normal compressibility, respiratory phasicity and response to augmentation. Calf Veins: No evidence of thrombus. Normal compressibility and flow on color Doppler imaging. Superficial Great Saphenous Vein: No evidence of thrombus. Normal compressibility. Venous Reflux:  None. Other Findings:  None. LEFT LOWER EXTREMITY Common Femoral Vein: No evidence of thrombus. Normal compressibility, respiratory phasicity and response to augmentation. Saphenofemoral Junction: No evidence of thrombus. Normal compressibility and flow on color Doppler imaging. Profunda Femoral Vein: No evidence of thrombus. Normal compressibility and flow on color Doppler imaging. Femoral Vein: No evidence of thrombus. Normal compressibility, respiratory phasicity and response to augmentation. Popliteal Vein: No evidence of thrombus. Normal compressibility, respiratory phasicity and response to augmentation. Calf Veins: No evidence of thrombus. Normal compressibility and  flow on color Doppler imaging. Superficial Great Saphenous Vein: No evidence of thrombus. Normal compressibility. Venous Reflux:  None. Other Findings:  None. IMPRESSION: No evidence of deep venous thrombosis in either lower extremity. Electronically Signed   By: Burman Nieves M.D.   On: 05/26/2018 21:17    ASSESSMENT: Anemia affecting pregnancy in the third trimester.  PLAN:    1. Anemia affecting pregnancy in the third trimester: Patient has a significantly reduced hemoglobin as well as iron stores.  The remainder of her laboratory work is either negative or within normal limits.  She does not report any family history of sickle cell or thalassemia, but admits she is unsure.  Will draw hemoglobinopathy profile at next clinic visit.  Patient will return to clinic in 1 and 2 weeks for 510 mg of IV Feraheme.  Patient will then  return to clinic second week of May 2020 for further evaluation and consideration of additional IV Feraheme. 2.  Pregnancy: Patient reports her due date is approximately Aug 09, 2018.  Patient expressed understanding and was in agreement with this plan. She also understands that She can call clinic at any time with any questions, concerns, or complaints.    Jeralyn Ruths, MD   06/03/2018 6:57 AM

## 2018-06-02 ENCOUNTER — Encounter: Payer: Self-pay | Admitting: Obstetrics and Gynecology

## 2018-06-02 ENCOUNTER — Inpatient Hospital Stay: Payer: Medicaid Other

## 2018-06-02 ENCOUNTER — Inpatient Hospital Stay: Payer: Medicaid Other | Attending: Oncology | Admitting: Oncology

## 2018-06-02 ENCOUNTER — Ambulatory Visit (INDEPENDENT_AMBULATORY_CARE_PROVIDER_SITE_OTHER): Payer: Medicaid Other | Admitting: Obstetrics and Gynecology

## 2018-06-02 ENCOUNTER — Ambulatory Visit (INDEPENDENT_AMBULATORY_CARE_PROVIDER_SITE_OTHER): Payer: Medicaid Other

## 2018-06-02 ENCOUNTER — Other Ambulatory Visit: Payer: Medicaid Other

## 2018-06-02 ENCOUNTER — Encounter: Payer: Self-pay | Admitting: Oncology

## 2018-06-02 ENCOUNTER — Other Ambulatory Visit: Payer: Self-pay

## 2018-06-02 VITALS — BP 118/60 | Wt 184.0 lb

## 2018-06-02 VITALS — BP 127/56 | HR 108 | Temp 97.7°F | Resp 18 | Ht 62.0 in | Wt 184.0 lb

## 2018-06-02 DIAGNOSIS — Z79899 Other long term (current) drug therapy: Secondary | ICD-10-CM | POA: Insufficient documentation

## 2018-06-02 DIAGNOSIS — Z23 Encounter for immunization: Secondary | ICD-10-CM | POA: Diagnosis not present

## 2018-06-02 DIAGNOSIS — O0990 Supervision of high risk pregnancy, unspecified, unspecified trimester: Secondary | ICD-10-CM

## 2018-06-02 DIAGNOSIS — Z87891 Personal history of nicotine dependence: Secondary | ICD-10-CM | POA: Insufficient documentation

## 2018-06-02 DIAGNOSIS — O99013 Anemia complicating pregnancy, third trimester: Secondary | ICD-10-CM | POA: Insufficient documentation

## 2018-06-02 DIAGNOSIS — Z363 Encounter for antenatal screening for malformations: Secondary | ICD-10-CM

## 2018-06-02 DIAGNOSIS — Z3A30 30 weeks gestation of pregnancy: Secondary | ICD-10-CM

## 2018-06-02 DIAGNOSIS — O0993 Supervision of high risk pregnancy, unspecified, third trimester: Secondary | ICD-10-CM

## 2018-06-02 DIAGNOSIS — D509 Iron deficiency anemia, unspecified: Secondary | ICD-10-CM | POA: Insufficient documentation

## 2018-06-02 DIAGNOSIS — R0600 Dyspnea, unspecified: Secondary | ICD-10-CM | POA: Diagnosis not present

## 2018-06-02 DIAGNOSIS — Z131 Encounter for screening for diabetes mellitus: Secondary | ICD-10-CM

## 2018-06-02 DIAGNOSIS — Z369 Encounter for antenatal screening, unspecified: Secondary | ICD-10-CM

## 2018-06-02 LAB — FOLATE: Folate: 16.1 ng/mL (ref >5.9)

## 2018-06-02 LAB — CBC
HCT: 25.1 % — ABNORMAL LOW (ref 36.0–46.0)
Hemoglobin: 7.6 g/dL — ABNORMAL LOW (ref 12.0–15.0)
MCH: 22.2 pg — ABNORMAL LOW (ref 26.0–34.0)
MCHC: 30.3 g/dL (ref 30.0–36.0)
MCV: 73.2 fL — ABNORMAL LOW (ref 80.0–100.0)
Platelets: 214 10*3/uL (ref 150–400)
RBC: 3.43 MIL/uL — ABNORMAL LOW (ref 3.87–5.11)
RDW: 18.2 % — ABNORMAL HIGH (ref 11.5–15.5)
WBC: 8.1 10*3/uL (ref 4.0–10.5)
nRBC: 0 % (ref 0.0–0.2)

## 2018-06-02 LAB — IRON AND TIBC
Iron: 28 ug/dL (ref 28–170)
Saturation Ratios: 4 % — ABNORMAL LOW (ref 10.4–31.8)
TIBC: 636 ug/dL — AB (ref 250–450)
UIBC: 608 ug/dL

## 2018-06-02 LAB — FERRITIN: Ferritin: 4 ng/mL — ABNORMAL LOW (ref 11–307)

## 2018-06-02 LAB — LACTATE DEHYDROGENASE: LDH: 117 U/L (ref 98–192)

## 2018-06-02 LAB — VITAMIN B12: Vitamin B-12: 280 pg/mL (ref 180–914)

## 2018-06-02 MED ORDER — VITAFOL FE+ 90-0.6-0.4-200 MG PO CAPS: 1.0000 | ORAL_CAPSULE | Freq: Every day | ORAL | 11 refills | Status: DC

## 2018-06-02 NOTE — Progress Notes (Signed)
Patient here for initial visit. She is [redacted] weeks pregnant. She states she is tired and out of breath all the time. States she currently smokes marijuana on a daily basis. Patient stopped smoking cigarettes when she found out she was pregnant.

## 2018-06-02 NOTE — Progress Notes (Signed)
      Routine Prenatal Care Visit  Subjective  Kristy Reid is a 23 y.o. [phone] at [redacted]w[redacted]d being seen today for ongoing prenatal care.  She is currently monitored for the following issues for this high-risk pregnancy and has Severe recurrent major depression without psychotic features (HCC); Supervision of high risk pregnancy, antepartum; Cannabis use disorder, severe, dependence (HCC); Tobacco use disorder; Asthma; Borderline personality disorder (HCC); PTSD (post-traumatic stress disorder); Overdose; Constipation during pregnancy in second trimester; Abdominal pain in pregnancy; Decreased fetal movement; Indication for care in labor and delivery, antepartum; Labor and delivery, indication for care; Normal labor; MDD (major depressive disorder), recurrent, severe, with psychosis (HCC); Severe recurrent major depression with psychotic features (HCC); Bipolar 2 disorder (HCC); Noncompliance; Anxiety disorder; Depression with suicidal ideation; Alcohol abuse; Schizoaffective disorder, bipolar type (HCC); Limited prenatal care in second trimester; and Anemia affecting pregnancy in third trimester on their problem list.  ----------------------------------------------------------------------------------- Patient reports no complaints.    .  .   . Denies leaking of fluid.  ----------------------------------------------------------------------------------- The following portions of the patient's history were reviewed and updated as appropriate: allergies, current medications, past family history, past medical history, past social history, past surgical history and problem list. Problem list updated.   Objective  Blood pressure 118/60, weight 184 lb (83.5 kg), last menstrual period 11/02/2017, unknown if currently breastfeeding. Pregravid weight 150 lb (68 kg) Total Weight Gain 34 lb (15.4 kg) Urinalysis:      Fetal Status:           General:  Alert, oriented and cooperative. Patient is in no acute  distress.  Skin: Skin is warm and dry. No rash noted.   Cardiovascular: Normal heart rate noted  Respiratory: Normal respiratory effort, no problems with respiration noted  Abdomen: Soft, gravid, appropriate for gestational age.       Pelvic:  Cervical exam deferred        Extremities: Normal range of motion.     Mental Status: Normal mood and affect. Normal behavior. Normal judgment and thought content.     Assessment   23 y.o. [id number] at [redacted]w[redacted]d by  08/09/2018, by Last Menstrual Period presenting for routine prenatal visit  Plan   pregnancy 4 Problems (from 01/02/18 to present)    No problems associated with this episode.        PNR not yet received from prior OB/GYN in [place], made need completion lab work at next visit, including sickle cell screen.  Hx of hemorrhage. Is anemic, was referred to hematology [place] normal today Tdap today Did not sign tubal consents today, she remains undecided.   Gestational age appropriate obstetric precautions including but not limited to vaginal bleeding, contractions, leaking of fluid and fetal movement were reviewed in detail with the patient.    Return in about 2 weeks (around 06/16/2018) for ROB.  Natale Milch MD [facility], [facility] 06/02/2018, 12:38 PM

## 2018-06-02 NOTE — Progress Notes (Signed)
ROB C/o some braxton hicks  Tdap/BT consent today

## 2018-06-03 ENCOUNTER — Encounter: Payer: Self-pay | Admitting: Emergency Medicine

## 2018-06-03 ENCOUNTER — Observation Stay
Admission: EM | Admit: 2018-06-03 | Discharge: 2018-06-03 | Disposition: A | Discharge status: Home / Self Care | Payer: Medicaid Other | Attending: Certified Nurse Midwife | Admitting: Certified Nurse Midwife

## 2018-06-03 ENCOUNTER — Other Ambulatory Visit: Payer: Self-pay

## 2018-06-03 DIAGNOSIS — F250 Schizoaffective disorder, bipolar type: Secondary | ICD-10-CM | POA: Insufficient documentation

## 2018-06-03 DIAGNOSIS — O99353 Diseases of the nervous system complicating pregnancy, third trimester: Secondary | ICD-10-CM | POA: Insufficient documentation

## 2018-06-03 DIAGNOSIS — O99343 Other mental disorders complicating pregnancy, third trimester: Secondary | ICD-10-CM | POA: Diagnosis not present

## 2018-06-03 DIAGNOSIS — G8929 Other chronic pain: Secondary | ICD-10-CM | POA: Insufficient documentation

## 2018-06-03 DIAGNOSIS — R42 Dizziness and giddiness: Secondary | ICD-10-CM | POA: Diagnosis not present

## 2018-06-03 DIAGNOSIS — Z881 Allergy status to other antibiotic agents status: Secondary | ICD-10-CM | POA: Insufficient documentation

## 2018-06-03 DIAGNOSIS — R0789 Other chest pain: Secondary | ICD-10-CM | POA: Insufficient documentation

## 2018-06-03 DIAGNOSIS — Z87891 Personal history of nicotine dependence: Secondary | ICD-10-CM | POA: Diagnosis not present

## 2018-06-03 DIAGNOSIS — O479 False labor, unspecified: Secondary | ICD-10-CM

## 2018-06-03 DIAGNOSIS — J45909 Unspecified asthma, uncomplicated: Secondary | ICD-10-CM | POA: Insufficient documentation

## 2018-06-03 DIAGNOSIS — Z880 Allergy status to penicillin: Secondary | ICD-10-CM | POA: Diagnosis not present

## 2018-06-03 DIAGNOSIS — R079 Chest pain, unspecified: Secondary | ICD-10-CM

## 2018-06-03 DIAGNOSIS — O4700 False labor before 37 completed weeks of gestation, unspecified trimester: Secondary | ICD-10-CM | POA: Diagnosis present

## 2018-06-03 DIAGNOSIS — Z3A30 30 weeks gestation of pregnancy: Secondary | ICD-10-CM | POA: Diagnosis not present

## 2018-06-03 DIAGNOSIS — D649 Anemia, unspecified: Secondary | ICD-10-CM | POA: Insufficient documentation

## 2018-06-03 DIAGNOSIS — Z888 Allergy status to other drugs, medicaments and biological substances status: Secondary | ICD-10-CM | POA: Diagnosis not present

## 2018-06-03 DIAGNOSIS — Z79899 Other long term (current) drug therapy: Secondary | ICD-10-CM | POA: Insufficient documentation

## 2018-06-03 DIAGNOSIS — O99013 Anemia complicating pregnancy, third trimester: Secondary | ICD-10-CM | POA: Insufficient documentation

## 2018-06-03 DIAGNOSIS — R55 Syncope and collapse: Secondary | ICD-10-CM | POA: Insufficient documentation

## 2018-06-03 DIAGNOSIS — O99513 Diseases of the respiratory system complicating pregnancy, third trimester: Secondary | ICD-10-CM | POA: Diagnosis not present

## 2018-06-03 DIAGNOSIS — O4703 False labor before 37 completed weeks of gestation, third trimester: Secondary | ICD-10-CM | POA: Diagnosis not present

## 2018-06-03 DIAGNOSIS — F4310 Post-traumatic stress disorder, unspecified: Secondary | ICD-10-CM | POA: Diagnosis not present

## 2018-06-03 DIAGNOSIS — O0990 Supervision of high risk pregnancy, unspecified, unspecified trimester: Secondary | ICD-10-CM

## 2018-06-03 DIAGNOSIS — O26893 Other specified pregnancy related conditions, third trimester: Secondary | ICD-10-CM | POA: Diagnosis not present

## 2018-06-03 LAB — BASIC METABOLIC PANEL
Anion gap: 7 (ref 5–15)
BUN: 8 mg/dL (ref 6–20)
CO2: 20 mmol/L — ABNORMAL LOW (ref 22–32)
Calcium: 8.7 mg/dL — ABNORMAL LOW (ref 8.9–10.3)
Chloride: 108 mmol/L (ref 98–111)
Creatinine, Ser: 0.72 mg/dL (ref 0.44–1.00)
GFR calc non Af Amer: >60 mL/min (ref >60)
Glucose, Bld: 115 mg/dL — ABNORMAL HIGH (ref 70–99)
Potassium: 3.5 mmol/L (ref 3.5–5.1)
Sodium: 135 mmol/L (ref 135–145)

## 2018-06-03 LAB — 28 WEEK RH+PANEL
Basophils Absolute: 0 10*3/uL (ref 0.0–0.2)
Basos: 0 %
EOS (ABSOLUTE): 0.2 10*3/uL (ref 0.0–0.4)
Eos: 2 %
Gestational Diabetes Screen: 97 mg/dL (ref 65–139)
HEMOGLOBIN: 7.4 g/dL — AB (ref 11.1–15.9)
HIV Screen 4th Generation wRfx: NONREACTIVE
Hematocrit: 24.5 % — ABNORMAL LOW (ref 34.0–46.6)
IMMATURE GRANS (ABS): 0 10*3/uL (ref 0.0–0.1)
Immature Granulocytes: 1 %
Lymphocytes Absolute: 2.2 10*3/uL (ref 0.7–3.1)
Lymphs: 30 %
MCH: 21.8 pg — ABNORMAL LOW (ref 26.6–33.0)
MCHC: 30.2 g/dL — ABNORMAL LOW (ref 31.5–35.7)
MCV: 72 fL — ABNORMAL LOW (ref 79–97)
Monocytes Absolute: 0.7 10*3/uL (ref 0.1–0.9)
Monocytes: 10 %
NEUTROS ABS: 4.2 10*3/uL (ref 1.4–7.0)
Neutrophils: 57 %
Platelets: 219 10*3/uL (ref 150–450)
RBC: 3.39 x10E6/uL — ABNORMAL LOW (ref 3.77–5.28)
RDW: 17.4 % — ABNORMAL HIGH (ref 11.7–15.4)
RPR Ser Ql: NONREACTIVE
WBC: 7.4 10*3/uL (ref 3.4–10.8)

## 2018-06-03 LAB — CBC
HCT: 26.7 % — ABNORMAL LOW (ref 36.0–46.0)
Hemoglobin: 7.8 g/dL — ABNORMAL LOW (ref 12.0–15.0)
MCH: 21.5 pg — ABNORMAL LOW (ref 26.0–34.0)
MCHC: 29.2 g/dL — ABNORMAL LOW (ref 30.0–36.0)
MCV: 73.6 fL — ABNORMAL LOW (ref 80.0–100.0)
NRBC: 0.2 % (ref 0.0–0.2)
Platelets: 244 10*3/uL (ref 150–400)
RBC: 3.63 MIL/uL — ABNORMAL LOW (ref 3.87–5.11)
RDW: 18.1 % — ABNORMAL HIGH (ref 11.5–15.5)
WBC: 9.6 10*3/uL (ref 4.0–10.5)

## 2018-06-03 LAB — HAPTOGLOBIN: Haptoglobin: 88 mg/dL (ref 33–278)

## 2018-06-03 LAB — TROPONIN I: Troponin I: <0.03 ng/mL (ref <0.03)

## 2018-06-03 MED ORDER — SODIUM CHLORIDE 0.9% FLUSH
3.0000 mL | Freq: Once | INTRAVENOUS | Status: AC
  Administered 2018-06-03: 3 mL via INTRAVENOUS

## 2018-06-03 NOTE — ED Notes (Signed)
Pt updated. Denies any needs.

## 2018-06-03 NOTE — Final Progress Note (Signed)
Physician Final Progress Note  Patient ID: Kristy Reid MRN: [id number] DOB/AGE: 1995/05/05 22 y.o.  Admit date: 06/03/2018 Admitting provider: Conard Novak, MD/ Trinna Balloon, CNM Discharge date: 06/03/2018   Admission Diagnoses: IUP at 30wk3d with preterm contractions  Discharge Diagnoses:  IUP at 30wk3d- preterm contractions resolved  Consults: None  Significant Findings/ Diagnostic Studies: 23 year old G4 P2012 with EDC= 08/09/2018 presented to L&D at [facility] for evaluation of preterm contractions after being seen and evaluated in ER for chest pain, SOB, and a near syncopal event. Her EKG was significant for sinus tachycardia, but no concerning ST changes. Troponin levels were normal. Last week she had a CTA of the lungs that was normal. She remains anemic with a hemoglobin of 7.8 gm/dl. It is possible that her symptoms are due to her anemia. She has seen a hematologist and will be getting an IV iron infusion next week. She has a long history of anemia since prior to her first delivery.   Upon arrival to the L&D, she stated that the contractions had stopped. Baby has been moving well. No vaginal bleeding or leakage of fluid.  Her pregnancy has also  been complicated by asthma, bipolar disorder, chronic pain, depression, PTSD, and schizophrenia  Exam: BP 128/66 (BP Location: Left Arm)   Pulse 96   Temp 98.2 F (36.8 C) (Oral)   Resp 18   Ht 5\' 2"  (1.575 m)   Wt 83.5 kg   LMP 11/02/2017   SpO2 100%   BMI 33.65 kg/m   General: gravid BF in NAD Abdomen: soft, tender in LUS when doing Leopold's maneuvers. FHR: 135-140 with accelerations to 150s to 160, moderate variability Toco: initially uterine irritability, then that resolved Cervix: L/T/C  A: IUP at 30wk3d- no evidence of preterm labor Symptomatic anemia  P: Discharge home with labor precautions Go to hematology appointment for IV iron next week as scheduled Increase water intake Avoid warm  environments  Procedures: none  Discharge Condition: stable  Disposition: Discharge disposition: 01-Home or Self Care       Diet: Regular diet  Discharge Activity: Ambulate in house  Discharge Instructions    Discharge patient   Complete by:  As directed    Discharge disposition:  01-Home or Self Care   Discharge patient date:  06/03/2018     Allergies as of 06/03/2018      Reactions   Penicillins Hives   Has patient had a PCN reaction causing immediate rash, facial/tongue/throat swelling, SOB or lightheadedness with hypotension: No Has patient had a PCN reaction causing severe rash involving mucus membranes or skin necrosis: No Has patient had a PCN reaction that required hospitalization No Has patient had a PCN reaction occurring within the last 10 years: No If all of the above answers are "NO", then may proceed with Cephalosporin use.   Rocephin [ceftriaxone] Hives   Zithromax [azithromycin] Itching, Nausea And Vomiting   Lidocaine Hives, Itching, Rash      Medication List    TAKE these medications   albuterol 108 (90 Base) MCG/ACT inhaler Commonly known as:  PROVENTIL HFA;VENTOLIN HFA Inhale into the lungs every 6 (six) hours as needed for wheezing or shortness of breath.   butalbital-acetaminophen-caffeine 50-325-40 MG tablet Commonly known as:  FIORICET, ESGIC Take 1-2 tablets by mouth every 6 (six) hours as needed for headache.   citalopram 10 MG tablet Commonly known as:  CELEXA Take 1 tablet (10 mg total) by mouth daily.   cyclobenzaprine 10  MG tablet Commonly known as:  FLEXERIL Take 1 tablet (10 mg total) by mouth 3 (three) times daily as needed.   docusate sodium 100 MG capsule Commonly known as:  COLACE Take 2 capsules (200 mg total) by mouth 2 (two) times daily.   famotidine 20 MG tablet Commonly known as:  PEPCID Take 1 tablet (20 mg total) by mouth 2 (two) times daily.   ferrous sulfate 325 (65 FE) MG tablet Take 1 tablet (325 mg total)  by mouth daily with breakfast.   fluticasone 50 MCG/ACT nasal spray Commonly known as:  FLONASE Place 1 spray into both nostrils 2 (two) times daily.   folic acid 1 MG tablet Commonly known as:  FOLVITE Take 1 tablet (1 mg total) by mouth daily.   loratadine 10 MG tablet Commonly known as:  CLARITIN Take 1 tablet (10 mg total) by mouth daily.   promethazine 25 MG tablet Commonly known as:  PHENERGAN Take 1 tablet (25 mg total) by mouth every 6 (six) hours as needed for nausea or vomiting.   traZODone 150 MG tablet Commonly known as:  DESYREL Take 1 tablet (150 mg total) by mouth at bedtime.   Vitafol FE+ 90-0.6-0.4-200 MG Caps Take 1 tablet by mouth daily.      Follow-up Information    Patient, No Pcp Per.   Specialty:  General Practice          Total time spent taking care of this patient: 15 minutes  Signed: Farrel Conners 06/03/2018, 9:11 PM

## 2018-06-03 NOTE — OB Triage Note (Signed)
Patient seen in ED and cleared for c/o chest pain. Patient arrived in Obs 2 with c/o chest pain still rating a "10/10", loss of consciousness this morning, headache, dizziness, and intermittent "braxton hicks"contractions. Patient reports good fetal movement. Denies leaking of fluid or vaginal bleeding. Denies feeling contractions at this time. EFM applied and assessing. Abdomen palpates soft, no tenderness. Maebelle Munroe CNM at nurse's station and informed of patient's arrival and complaints.

## 2018-06-03 NOTE — ED Triage Notes (Signed)
Pt to ED via POV, Pt is [redacted] week pregnant, G4P2. Pt states that she has been having chest pain x 2 hours. Pt reports that she went to get out of bed and had a syncopal episode. Pt states that prior to getting out of bed she felt lightheaded. Pt stating that has had N/V since this morning. Pt states that she has vomited x 4. Pt is in NAD at this time.   Pt also states that she has been having contractions since last night. Contractions have been constant since last night per patient. Pt states that when she was timing them they were about 12 minutes apart.

## 2018-06-03 NOTE — ED Provider Notes (Signed)
[facility] Emergency Department Provider Note  Time seen: 5:18 PM  I have reviewed the triage vital signs and the nursing notes.   HISTORY  Chief Complaint Chest Pain and Loss of Consciousness   HPI Kristy Reid is a 23 y.o. female with a past medical history of anemia, asthma, bipolar disorder, chronic pain, depression, PTSD, schizophrenia, approximate [redacted] weeks pregnant who presents to the emergency department with chest discomfort or syncopal episode, nausea vomiting and contraction type pain.  According to the patient for the past several weeks she has been experiencing intermittent chest pain or shortness of breath.  Patient was seen in the emergency department 05/26/2018 for the same had a negative work-up including a CTA of the chest.  Patient states today she got lightheaded and passed out or nearly passed out after standing up.  Patient states the chest pain has not changed over the past few weeks.  Patient states she has Phenergan she takes at home for nausea or vomiting but tried taking it earlier today and vomited shortly after taking it.  Patient states also today she has been experiencing intermittent cramping/contractions in her lower abdomen.  Denies any vaginal bleeding.   Past Medical History:  Diagnosis Date  . Anemia   . Anemia   . Anxiety   . Asthma   . Bipolar 1 disorder (HCC)   . Chronic back pain   . Depression   . Insomnia   . Personality disorder (HCC)    "boarderline"  . PTSD (post-traumatic stress disorder)     Patient Active Problem List   Diagnosis Date Noted  . Anemia affecting pregnancy in third trimester 05/30/2018  . Limited prenatal care in second trimester 05/19/2018  . Depression with suicidal ideation 05/04/2018  . Alcohol abuse 05/04/2018  . Schizoaffective disorder, bipolar type (HCC) 05/04/2018  . Anxiety disorder   . Bipolar 2 disorder (HCC) 11/11/2016  . Noncompliance 11/11/2016  . Severe recurrent major  depression with psychotic features (HCC) 07/18/2016  . MDD (major depressive disorder), recurrent, severe, with psychosis (HCC) 07/17/2016  . Normal labor 05/07/2016  . Labor and delivery, indication for care 04/19/2016  . Indication for care in labor and delivery, antepartum 04/06/2016  . Decreased fetal movement 03/22/2016  . Abdominal pain in pregnancy 01/21/2016  . Constipation during pregnancy in second trimester 12/26/2015  . Overdose 11/29/2015  . Supervision of high risk pregnancy, antepartum 11/15/2015  . Cannabis use disorder, severe, dependence (HCC) 11/15/2015  . Tobacco use disorder 11/15/2015  . Asthma 11/15/2015  . Borderline personality disorder (HCC) 11/15/2015  . PTSD (post-traumatic stress disorder) 11/15/2015  . Severe recurrent major depression without psychotic features (HCC) 11/13/2015    Past Surgical History:  Procedure Laterality Date  . BUNIONECTOMY Bilateral    feet  . wrist sugery      Prior to Admission medications   Medication Sig Start Date End Date Taking? Authorizing Provider  butalbital-acetaminophen-caffeine (FIORICET, ESGIC) [phone] MG tablet Take 1-2 tablets by mouth every 6 (six) hours as needed for headache. 05/23/18 05/23/19  Oswaldo Conroy, CNM  citalopram (CELEXA) 10 MG tablet Take 1 tablet (10 mg total) by mouth daily. 05/06/18   [name], [name], MD  cyclobenzaprine (FLEXERIL) 10 MG tablet Take 1 tablet (10 mg total) by mouth 3 (three) times daily as needed. 05/05/18   [name], [name], MD  docusate sodium (COLACE) 100 MG capsule Take 2 capsules (200 mg total) by mouth 2 (two) times daily. 05/05/18  [name], [name], MD  famotidine (PEPCID) 20 MG tablet Take 1 tablet (20 mg total) by mouth 2 (two) times daily. 05/05/18   [name], [name], MD  ferrous sulfate 325 (65 FE) MG tablet Take 1 tablet (325 mg total) by mouth daily with breakfast. 05/05/18   [name], [name], MD  fluticasone (FLONASE) 50 MCG/ACT nasal spray Place 1 spray into both nostrils  2 (two) times daily. 05/05/18   [name], [name], MD  folic acid (FOLVITE) 1 MG tablet Take 1 tablet (1 mg total) by mouth daily. 05/05/18   [name], [name], MD  loratadine (CLARITIN) 10 MG tablet Take 1 tablet (10 mg total) by mouth daily. 05/05/18   [name], [name], MD  Prenat-Fe Poly-Methfol-FA-DHA (VITAFOL FE+) 90-0.6-0.4-200 MG CAPS Take 1 tablet by mouth daily. 06/02/18   Schuman, Jaquelyn Bitter, MD  promethazine (PHENERGAN) 25 MG tablet Take 1 tablet (25 mg total) by mouth every 6 (six) hours as needed for nausea or vomiting. 05/05/18   [name], [name], MD  traZODone (DESYREL) 150 MG tablet Take 1 tablet (150 mg total) by mouth at bedtime. 05/05/18   [name], [name], MD    Allergies  Allergen Reactions  . Penicillins Hives    Has patient had a PCN reaction causing immediate rash, facial/tongue/throat swelling, SOB or lightheadedness with hypotension: No Has patient had a PCN reaction causing severe rash involving mucus membranes or skin necrosis: No Has patient had a PCN reaction that required hospitalization No Has patient had a PCN reaction occurring within the last 10 years: No If all of the above answers are "NO", then may proceed with Cephalosporin use.  . Rocephin [Ceftriaxone] Hives  . Zithromax [Azithromycin] Itching and Nausea And Vomiting  . Lidocaine Hives, Itching and Rash    Family History  Problem Relation Age of Onset  . Diabetes Mother   . Hypertension Mother   . Mental illness Mother   . Diabetes Maternal Grandmother   . Heart disease Maternal Grandmother   . Cancer Neg Hx   . Ovarian cancer Neg Hx     Social History Social History   Tobacco Use  . Smoking status: Former Smoker    Types: Cigarettes    Last attempt to quit: 08/13/2015    Years since quitting: 2.8  . Smokeless tobacco: Never Used  Substance Use Topics  . Alcohol use: No  . Drug use: Yes    Frequency: 7.0 times per week    Types: Marijuana    Comment: daily use    Review of  Systems Constitutional: Negative for fever. Cardiovascular: Chest pain x3 weeks described more as a moderate tightness sensation. Respiratory: Intermittent shortness of breath times weeks. Gastrointestinal: Lower abdominal cramping/contraction pain since this morning.  Positive for nausea and vomiting. Genitourinary: Negative for urinary compaints Musculoskeletal: Negative for musculoskeletal complaints Skin: Negative for skin complaints  Neurological: Negative for headache All other ROS negative  ____________________________________________   PHYSICAL EXAM:  VITAL SIGNS: ED Triage Vitals  Enc Vitals Group     BP 06/03/18 1647 127/69     Pulse Rate 06/03/18 1647 (!) 104     Resp 06/03/18 1647 16     Temp 06/03/18 1647 98.3 F (36.8 C)     Temp Source 06/03/18 1647 Oral     SpO2 06/03/18 1647 99 %     Weight 06/03/18 1648 184 lb (83.5 kg)     Height 06/03/18 1648 5\' 2"  (1.575 m)     Head  Circumference --      Peak Flow --      Pain Score 06/03/18 1648 10     Pain Loc --      Pain Edu? --      Excl. in GC? --     Constitutional: Alert and oriented. Well appearing and in no distress. Eyes: Normal exam ENT   Head: Normocephalic and atraumatic.   Mouth/Throat: Mucous membranes are moist. Cardiovascular: Normal rate, regular rhythm. No murmur Respiratory: Normal respiratory effort without tachypnea nor retractions. Breath sounds are clear Gastrointestinal: Gravid abdomen.  Largely nontender to palpation. Musculoskeletal: Nontender with normal range of motion in all extremities.  Neurologic:  Normal speech and language. No gross focal neurologic deficits  Skin:  Skin is warm, dry and intact.  Psychiatric: Mood and affect are normal.  ____________________________________________    EKG  EKG viewed and interpreted by myself shows sinus tachycardia 130 bpm with a narrow QRS, normal axis, normal intervals, no concerning ST  changes.  ____________________________________________   INITIAL IMPRESSION / ASSESSMENT AND PLAN / ED COURSE  Pertinent labs & imaging results that were available during my care of the patient were reviewed by me and considered in my medical decision making (see chart for details).  Patient presents to the emergency department for nausea vomiting this morning after syncope versus near syncopal episode upon standing.  States she has had chest pain for 3 weeks described as a tightness sensation with intermittent shortness of breath.  Patient was seen in the emergency department 1 week ago for the same had a large work-up including a CT angiography of the chest that was negative.  Describes the chest pain as being unchanged since that time.  Patient states nausea vomiting throughout her pregnancy takes Phenergan at home, try taking it this morning but vomited.  Patient is somewhat tachycardic during my evaluation between 100-110 bpm.  We will IV hydrate the patient.  We will check labs including cardiac enzymes.  Given patient has had multiple images of the chest throughout her pregnancy and clear lung sounds at this time I do not feel further imaging of the chest is warranted at this time.  Patient's heart rate is come down nearly 20 to 30 bpm without intervention.  We will reassess after IV hydration and lab works have resulted.  Given the patient's intermittent lower abdominal cramping/contraction type pain we will send to labor and delivery once her emergency department work-up is completed.  Patient's work-up is largely nonrevealing.  Patient is anemic although largely at baseline in fact somewhat improved from baseline.  Patient is troponin is negative.  Patient states she is feeling better.  Continues have intermittent lower abdominal cramping.  We will send to labor and delivery for further evaluation.  ____________________________________________   FINAL CLINICAL IMPRESSION(S) / ED  DIAGNOSES  Chest pain Nausea vomiting Contractions   Minna Antis, MD 06/03/18 [phone]

## 2018-06-03 NOTE — OB Triage Note (Signed)
Patient given discharge instructions including preterm labor precautions, fetal kick count instructions, and follow up information. Patient verbalized understanding, no further questions. Patient discharged via wheelchair accompanied by RN, sister met patient at ER entrance to drive home. Patient discharged in stable condition.

## 2018-06-03 NOTE — ED Notes (Signed)
Pt states LOC, unsure if hit head. HA. A&Ox4. Episode of passing out witnessed by family/friend per pt. SOB with CP per pt. Emesis per pt.

## 2018-06-04 ENCOUNTER — Encounter (HOSPITAL_COMMUNITY): Payer: Self-pay | Admitting: Emergency Medicine

## 2018-06-04 ENCOUNTER — Other Ambulatory Visit: Payer: Self-pay

## 2018-06-04 ENCOUNTER — Other Ambulatory Visit: Payer: Self-pay | Admitting: Obstetrics & Gynecology

## 2018-06-04 ENCOUNTER — Emergency Department (HOSPITAL_COMMUNITY)
Admission: EM | Admit: 2018-06-04 | Discharge: 2018-06-04 | Disposition: A | Discharge status: Home / Self Care | Payer: Medicaid Other | Attending: Emergency Medicine | Admitting: Emergency Medicine

## 2018-06-04 DIAGNOSIS — R0789 Other chest pain: Secondary | ICD-10-CM | POA: Insufficient documentation

## 2018-06-04 DIAGNOSIS — O99013 Anemia complicating pregnancy, third trimester: Secondary | ICD-10-CM | POA: Diagnosis not present

## 2018-06-04 DIAGNOSIS — R0602 Shortness of breath: Secondary | ICD-10-CM | POA: Diagnosis not present

## 2018-06-04 LAB — CBC WITH DIFFERENTIAL/PLATELET
Abs Immature Granulocytes: 0.07 10*3/uL (ref 0.00–0.07)
Basophils Absolute: 0 10*3/uL (ref 0.0–0.1)
Basophils Relative: 0 %
EOS ABS: 0.1 10*3/uL (ref 0.0–0.5)
Eosinophils Relative: 1 %
HCT: 27 % — ABNORMAL LOW (ref 36.0–46.0)
Hemoglobin: 7.5 g/dL — ABNORMAL LOW (ref 12.0–15.0)
Immature Granulocytes: 1 %
Lymphocytes Relative: 22 %
Lymphs Abs: 2.1 10*3/uL (ref 0.7–4.0)
MCH: 20.7 pg — AB (ref 26.0–34.0)
MCHC: 27.8 g/dL — AB (ref 30.0–36.0)
MCV: 74.6 fL — ABNORMAL LOW (ref 80.0–100.0)
MONO ABS: 0.6 10*3/uL (ref 0.1–1.0)
Monocytes Relative: 7 %
Neutro Abs: 6.4 10*3/uL (ref 1.7–7.7)
Neutrophils Relative %: 69 %
Platelets: 220 10*3/uL (ref 150–400)
RBC: 3.62 MIL/uL — ABNORMAL LOW (ref 3.87–5.11)
RDW: 18.4 % — ABNORMAL HIGH (ref 11.5–15.5)
WBC: 9.3 10*3/uL (ref 4.0–10.5)
nRBC: 0.2 % (ref 0.0–0.2)

## 2018-06-04 LAB — URINALYSIS, ROUTINE W REFLEX MICROSCOPIC
Bilirubin Urine: NEGATIVE
GLUCOSE, UA: NEGATIVE mg/dL
HGB URINE DIPSTICK: NEGATIVE
Ketones, ur: NEGATIVE mg/dL
Leukocytes,Ua: NEGATIVE
Nitrite: NEGATIVE
Protein, ur: NEGATIVE mg/dL
Specific Gravity, Urine: 1.01 (ref 1.005–1.030)
pH: 6 (ref 5.0–8.0)

## 2018-06-04 LAB — COMPREHENSIVE METABOLIC PANEL
ALT: 11 U/L (ref 0–44)
AST: 16 U/L (ref 15–41)
Albumin: 2.6 g/dL — ABNORMAL LOW (ref 3.5–5.0)
Alkaline Phosphatase: 91 U/L (ref 38–126)
Anion gap: 11 (ref 5–15)
BILIRUBIN TOTAL: 0.2 mg/dL — AB (ref 0.3–1.2)
BUN: <5 mg/dL — ABNORMAL LOW (ref 6–20)
CO2: 17 mmol/L — ABNORMAL LOW (ref 22–32)
Calcium: 8.9 mg/dL (ref 8.9–10.3)
Chloride: 107 mmol/L (ref 98–111)
Creatinine, Ser: 0.62 mg/dL (ref 0.44–1.00)
GFR calc Af Amer: >60 mL/min (ref >60)
Glucose, Bld: 130 mg/dL — ABNORMAL HIGH (ref 70–99)
Potassium: 3.7 mmol/L (ref 3.5–5.1)
Sodium: 135 mmol/L (ref 135–145)
Total Protein: 6.4 g/dL — ABNORMAL LOW (ref 6.5–8.1)

## 2018-06-04 LAB — I-STAT TROPONIN, ED: Troponin i, poc: 0 ng/mL (ref 0.00–0.08)

## 2018-06-04 MED ORDER — SODIUM CHLORIDE 0.9 % IV BOLUS
1000.0000 mL | Freq: Once | INTRAVENOUS | Status: AC
  Administered 2018-06-04: 1000 mL via INTRAVENOUS

## 2018-06-04 NOTE — Progress Notes (Signed)
Spoke with Dr. Jolayne Panther. Pt is a G4P2 at 30 4/[redacted] weeks gestation presenting with c/o chest pain and shortness of breath and some occasional, mild, abd cramping. Pt gets her care in [place] at [facility]. No vaginal bleeding or leaking of fluid. FHR tracing is a category 1 with no uc's. Pt's V/S are stable, 02 sats are 100%. EKD showed sinus tachycardia. Pt is in no apparent distress. Her HGB today is 7.5 and she says that she is scheduled for an iron infusion on Wednesday. Pt was seen yesterday in [place] for the same complaints and was discharged. Her cervix was closed, thk, presenting part ballotable. If the pt's cervix is unchanged, then she can be OB cleared.

## 2018-06-04 NOTE — ED Triage Notes (Signed)
Pt reports really bad chest pain and SOB x3 weeks. States that she is [redacted]weeks pregnant and has never had this issue before. Pt evaluated at [facility] yesterday and was told she needed iron transfusion which is scheduled for Wednesday

## 2018-06-04 NOTE — ED Provider Notes (Signed)
[facility] EMERGENCY DEPARTMENT Provider Note   CSN: [id number] Arrival date & time: 06/04/18  1434    History   Chief Complaint Chief Complaint  Patient presents with   Chest Pain   Shortness of Breath    HPI Kristy Reid is a 23 y.o. female G4P2 with a PMH of Asthma, Anemia, Borderline Personality Disorder, Bipolar Type 1, anemia, and anxiety presenting with constant chest pain across chest non radiating to arms, back, or neck onset 3 weeks ago. Patient describes chest pain as sharp and states it is worse with movement. Patient reports an intermittent dry cough for a few weeks. Patient reports shortness of breath with exertion, nausea, and vomiting. Patient reports 8 episodes of vomiting over the last 24 hours. Patient reports constipation and intermittent left sided abdominal cramping. Patient states she is unsure of her last BM, but states she has been passing gas. Patient states she has tried phenergan with partial relief throughout her pregnancy. Patient reports a presyncopal episode yesterday as she stood up and felt lightheaded. Patient reports contractions yesterday, but denies any contractions today. Patient denies vaginal bleeding, leaking fluid, fever, congestion, rhinorrhea, or recent travel. Patient reports sick exposures by her sister. Patient denies exposure to COVID. Patient was evaluated by the ER at [facility] yesterday. Patient was evaluated a 1 week ago for the same symptoms and had a negative CTA. Patient reports anemia and states she is scheduled for an iron infusion on Wednesday. Patient states she has required iron infusions in the past with her first pregnancy. Patient reports she received care at [facility] in [place].     HPI  Past Medical History:  Diagnosis Date   Anemia    Anemia    Anxiety    Asthma    Bipolar 1 disorder (HCC)    Chronic back pain    Depression    Insomnia    Personality disorder (HCC)    "boarderline"   PTSD (post-traumatic stress disorder)     Patient Active Problem List   Diagnosis Date Noted   Preterm contractions 06/03/2018   Anemia affecting pregnancy in third trimester 05/30/2018   Limited prenatal care in second trimester 05/19/2018   Depression with suicidal ideation 05/04/2018   Alcohol abuse 05/04/2018   Schizoaffective disorder, bipolar type (HCC) 05/04/2018   Anxiety disorder    Bipolar 2 disorder (HCC) 11/11/2016   Noncompliance 11/11/2016   Severe recurrent major depression with psychotic features (HCC) 07/18/2016   MDD (major depressive disorder), recurrent, severe, with psychosis (HCC) 07/17/2016   Normal labor 05/07/2016   Labor and delivery, indication for care 04/19/2016   Indication for care in labor and delivery, antepartum 04/06/2016   Decreased fetal movement 03/22/2016   Abdominal pain in pregnancy 01/21/2016   Constipation during pregnancy in second trimester 12/26/2015   Overdose 11/29/2015   Supervision of high risk pregnancy, antepartum 11/15/2015   Cannabis use disorder, severe, dependence (HCC) 11/15/2015   Tobacco use disorder 11/15/2015   Asthma 11/15/2015   Borderline personality disorder (HCC) 11/15/2015   PTSD (post-traumatic stress disorder) 11/15/2015   Severe recurrent major depression without psychotic features (HCC) 11/13/2015    Past Surgical History:  Procedure Laterality Date   BUNIONECTOMY Bilateral    feet   wrist sugery       OB History    Gravida  4   Para  2   Term  2   Preterm  0   AB  1   Living  2     SAB  1   TAB  0   Ectopic  0   Multiple  0   Live Births  2            Home Medications    Prior to Admission medications   Medication Sig Start Date End Date Taking? Authorizing Provider  albuterol (PROVENTIL HFA;VENTOLIN HFA) 108 (90 Base) MCG/ACT inhaler Inhale 2 puffs into the lungs every 6 (six) hours as needed for wheezing or shortness of breath.     Yes [provider]  butalbital-acetaminophen-caffeine (FIORICET, ESGIC) 50-325-40 MG tablet Take 1-2 tablets by mouth every 6 (six) hours as needed for headache. Patient taking differently: Take 1 tablet by mouth every 6 (six) hours as needed for headache.  05/23/18 05/23/19 Yes Oswaldo Conroy, CNM  calcium carbonate (TUMS - DOSED IN MG ELEMENTAL CALCIUM) 500 MG chewable tablet Chew 2 tablets by mouth daily as needed for indigestion or heartburn.   Yes [provider]  citalopram (CELEXA) 10 MG tablet Take 1 tablet (10 mg total) by mouth daily. 05/06/18  Yes [name], [name], MD  cyclobenzaprine (FLEXERIL) 10 MG tablet Take 1 tablet (10 mg total) by mouth 3 (three) times daily as needed. 05/05/18  Yes [name], [name], MD  docusate sodium (COLACE) 100 MG capsule Take 2 capsules (200 mg total) by mouth 2 (two) times daily. 05/05/18  Yes [name], [name], MD  famotidine (PEPCID) 20 MG tablet Take 1 tablet (20 mg total) by mouth 2 (two) times daily. 05/05/18  Yes [name], [name], MD  ferrous sulfate 325 (65 FE) MG tablet Take 1 tablet (325 mg total) by mouth daily with breakfast. 05/05/18  Yes [name], [name], MD  fluticasone (FLONASE) 50 MCG/ACT nasal spray Place 1 spray into both nostrils 2 (two) times daily. 05/05/18  Yes [name], [name], MD  folic acid (FOLVITE) 1 MG tablet Take 1 tablet (1 mg total) by mouth daily. 05/05/18  Yes [name], [name], MD  loratadine (CLARITIN) 10 MG tablet Take 1 tablet (10 mg total) by mouth daily. 05/05/18  Yes [name], [name], MD  promethazine (PHENERGAN) 25 MG tablet Take 1 tablet (25 mg total) by mouth every 6 (six) hours as needed for nausea or vomiting. 05/05/18  Yes [name], [name], MD  traZODone (DESYREL) 150 MG tablet Take 1 tablet (150 mg total) by mouth at bedtime. 05/05/18  Yes [name], [name], MD  Prenat-Fe Poly-Methfol-FA-DHA (VITAFOL FE+) 90-0.6-0.4-200 MG CAPS Take 1 tablet by mouth daily. Patient not taking: Reported on 06/04/2018 06/02/18    Natale Milch, MD    Family History Family History  Problem Relation Age of Onset   Diabetes Mother    Hypertension Mother    Mental illness Mother    Diabetes Maternal Grandmother    Heart disease Maternal Grandmother    Cancer Neg Hx    Ovarian cancer Neg Hx     Social History Social History   Tobacco Use   Smoking status: Former Smoker    Types: Cigarettes    Last attempt to quit: 08/13/2015    Years since quitting: 2.8   Smokeless tobacco: Never Used  Substance Use Topics   Alcohol use: No   Drug use: Yes    Frequency: 7.0 times per week    Types: Marijuana    Comment: daily use, last use yesterday     Allergies   Penicillins; Rocephin [ceftriaxone]; Zithromax [azithromycin]; and Lidocaine   Review  of Systems Review of Systems  Constitutional: Negative for activity change, appetite change, chills, diaphoresis, fatigue, fever and unexpected weight change.  HENT: Negative for congestion, rhinorrhea and sore throat.   Eyes: Negative for visual disturbance.  Respiratory: Positive for cough, chest tightness and shortness of breath. Negative for wheezing.   Cardiovascular: Positive for chest pain. Negative for palpitations and leg swelling.  Gastrointestinal: Positive for abdominal pain, constipation, nausea and vomiting.  Endocrine: Negative for cold intolerance and heat intolerance.  Genitourinary: Negative for dysuria, flank pain, frequency, vaginal bleeding, vaginal discharge and vaginal pain.  Musculoskeletal: Negative for back pain and neck pain.  Skin: Negative for rash.  Allergic/Immunologic: Negative for immunocompromised state.  Neurological: Positive for light-headedness. Negative for dizziness, syncope and weakness.       Pt reports episode of presyncope.  Psychiatric/Behavioral: Negative for agitation and behavioral problems. The patient is not nervous/anxious.     Physical Exam Updated Vital Signs BP 111/72    Pulse (!) 107     Temp 98.3 F (36.8 C) (Oral)    Resp 18    Ht 5\' 2"  (1.575 m)    Wt 83.5 kg    LMP 11/02/2017    SpO2 100%    BMI 33.65 kg/m   Physical Exam Vitals signs and nursing note reviewed.  Constitutional:      General: She is not in acute distress.    Appearance: She is well-developed. She is not diaphoretic.  HENT:     Head: Normocephalic and atraumatic.  Eyes:     Extraocular Movements: Extraocular movements intact.     Pupils: Pupils are equal, round, and reactive to light.  Neck:     Musculoskeletal: Normal range of motion and neck supple.  Cardiovascular:     Rate and Rhythm: Normal rate and regular rhythm.     Heart sounds: Normal heart sounds. No murmur. No friction rub. No gallop.   Pulmonary:     Effort: Pulmonary effort is normal. No respiratory distress.     Breath sounds: Normal breath sounds. No decreased breath sounds, wheezing, rhonchi or rales.  Abdominal:     Palpations: Abdomen is soft.     Tenderness: There is no abdominal tenderness.     Comments: Gravid, soft abdomen.  Musculoskeletal: Normal range of motion.     Right lower leg: She exhibits no tenderness. No edema.     Left lower leg: She exhibits no tenderness. No edema.  Skin:    General: Skin is warm.     Findings: No erythema or rash.  Neurological:     Mental Status: She is alert and oriented to person, place, and time.    Mental Status:  Alert, oriented, thought content appropriate, able to give a coherent history. Speech fluent without evidence of aphasia. Able to follow 2 step commands without difficulty.  Cranial Nerves:  II:  Peripheral visual fields grossly normal, pupils equal, round, reactive to light III,IV, VI: ptosis not present, extra-ocular motions intact bilaterally  V,VII: smile symmetric, facial light touch sensation equal VIII: hearing grossly normal to voice  X: uvula elevates symmetrically  XI: bilateral shoulder shrug symmetric and strong XII: midline tongue extension without  fassiculations Motor:  Normal tone. 5/5 in upper and lower extremities bilaterally including strong and equal grip strength and dorsiflexion/plantar flexion Sensory: light touch normal in all extremities.  Deep Tendon Reflexes: 2+ and symmetric in the biceps and patella Cerebellar: normal finger-to-nose with bilateral upper extremities Gait: normal gait and balance.  CV: distal pulses palpable throughout   ED Treatments / Results  Labs (all labs ordered are listed, but only abnormal results are displayed) Labs Reviewed  COMPREHENSIVE METABOLIC PANEL - Abnormal; Notable for the following components:      Result Value   CO2 17 (*)    Glucose, Bld 130 (*)    BUN <5 (*)    Total Protein 6.4 (*)    Albumin 2.6 (*)    Total Bilirubin 0.2 (*)    All other components within normal limits  CBC WITH DIFFERENTIAL/PLATELET - Abnormal; Notable for the following components:   RBC 3.62 (*)    Hemoglobin 7.5 (*)    HCT 27.0 (*)    MCV 74.6 (*)    MCH 20.7 (*)    MCHC 27.8 (*)    RDW 18.4 (*)    All other components within normal limits  URINALYSIS, ROUTINE W REFLEX MICROSCOPIC  I-STAT TROPONIN, ED    EKG EKG Interpretation  Date/Time:  Saturday June 04 2018 14:48:14 EDT Ventricular Rate:  125 PR Interval:    QRS Duration: 77 QT Interval:  317 QTC Calculation: 458 R Axis:   85 Text Interpretation:  Sinus tachycardia Borderline T abnormalities, inferior leads No significant change since last tracing Confirmed by Jacalyn Lefevre [phone]) on 06/04/2018 2:57:33 PM   Radiology No results found.  Procedures Procedures (including critical care time)  Medications Ordered in ED Medications  sodium chloride 0.9 % bolus 1,000 mL (0 mLs Intravenous Stopped 06/04/18 1743)     Initial Impression / Assessment and Plan / ED Course  I have reviewed the triage vital signs and the nursing notes.  Pertinent labs & imaging results that were available during my care of the patient were reviewed  by me and considered in my medical decision making (see chart for details).   Clinical Course as of Jun 03 1744  Sat Jun 04, 2018  1559 Low hemoglobin noted at 7.5. This is similar to previous values. Patient denies any bleeding. Patient has an iron infusion scheduled.  Hemoglobin(!): 7.5 [AH]  1600 WBCs are within normal limits.  WBC: 9.3 [AH]  1647 UA is unremarkable.  Urinalysis, Routine w reflex microscopic [AH]    Clinical Course User Index [AH] Leretha Dykes, PA-C      Patient presents with chest pain, shortness of breath, abdominal pain, and vomiting. Patient had a negative CXR, CTA, and leg ultrasounds 1 week ago. Lungs are clear to ausculation. Abdomen is soft and non tender. EKG without acute changes. Troponin is negative. UA is unremarkable. Provided IVF. Tachycardia improved while in the ER. Patient has not had any episodes of vomiting while in the ER. Oxygen saturation is 100%. Symptoms have improved on their own while in the ER. [facility] was consulted and reported no contractions and normal activity on the monitor. Discusses risks and benefits of imaging with patient. Shared decision making occurred with patient and will not order additional imaging at this time. Discussed return precautions. Advised patient to follow up with OBGYN/PCP in 2 days. Patient states she understands and agrees with plan.   Findings and plan of care discussed with supervising physician Dr. Particia Nearing and Dr. Charm Barges.   Final Clinical Impressions(s) / ED Diagnoses   Final diagnoses:  Atypical chest pain    ED Discharge Orders    None       Leretha Dykes, [place] 06/04/18 1746    Terrilee Files, MD 06/04/18 2237

## 2018-06-04 NOTE — Discharge Instructions (Addendum)
You have been seen today for chest pain. Please read and follow all provided instructions.   1. Medications: tums/tylenol for chest pain,  phenergan for vomiting as previously prescribed, usual home medications 2. Treatment: rest, drink plenty of fluids 3. Follow Up: Please call and follow up for iron infusion due to anemia. Please follow up with your primary doctor/OBGYN in 2 days for discussion of your diagnoses and further evaluation after today's visit; if you do not have a primary care doctor use the resource guide provided to find one; Please return to the ER for any new or worsening symptoms. Please obtain all of your results from medical records or have your doctors office obtain the results - share them with your doctor - you should be seen at your doctors office. Call today to arrange your follow up.   Take medications as prescribed. Please review all of the medicines and only take them if you do not have an allergy to them. Return to the emergency room for worsening condition or new concerning symptoms. Follow up with your regular doctor. If you don't have a regular doctor use one of the numbers below to establish a primary care doctor.  Please be aware that if you are taking birth control pills, taking other prescriptions, ESPECIALLY ANTIBIOTICS may make the birth control ineffective - if this is the case, either do not engage in sexual activity or use alternative methods of birth control such as condoms until you have finished the medicine and your family doctor says it is OK to restart them. If you are on a blood thinner such as COUMADIN, be aware that any other medicine that you take may cause the coumadin to either work too much, or not enough - you should have your coumadin level rechecked in next 7 days if this is the case.  ?  It is also a possibility that you have an allergic reaction to any of the medicines that you have been prescribed - Everybody reacts differently to medications and  while MOST people have no trouble with most medicines, you may have a reaction such as nausea, vomiting, rash, swelling, shortness of breath. If this is the case, please stop taking the medicine immediately and contact your physician.  ?  You should return to the ER if you develop severe or worsening symptoms.   Emergency Department Resource Guide 1) Find a Doctor and Pay Out of Pocket Although you won't have to find out who is covered by your insurance plan, it is a good idea to ask around and get recommendations. You will then need to call the office and see if the doctor you have chosen will accept you as a new patient and what types of options they offer for patients who are self-pay. Some doctors offer discounts or will set up payment plans for their patients who do not have insurance, but you will need to ask so you aren't surprised when you get to your appointment.  2) Contact Your Local Health Department Not all health departments have doctors that can see patients for sick visits, but many do, so it is worth a call to see if yours does. If you don't know where your local health department is, you can check in your phone book. The CDC also has a tool to help you locate your state's health department, and many state websites also have listings of all of their local health departments.  3) Find a Walk-in Clinic If your illness is not  likely to be very severe or complicated, you may want to try a walk in clinic. These are popping up all over the country in pharmacies, drugstores, and shopping centers. They're usually staffed by nurse practitioners or physician assistants that have been trained to treat common illnesses and complaints. They're usually fairly quick and inexpensive. However, if you have serious medical issues or chronic medical problems, these are probably not your best option.  No Primary Care Doctor: Call Health Connect at  [phone] - they can help you locate a primary care doctor  that  accepts your insurance, provides certain services, etc. Physician Referral Service- [phone]  Chronic Pain Problems: Organization         Address  Phone   Notes  [facility]  [phone] Patients need to be referred by their primary care doctor.   Medication Assistance: Organization         Address  Phone   Notes  [facility] Medication [facility] [address] [place] [place], [place] [address] [phone] --Must be a resident of [facility] -- Must have NO insurance coverage whatsoever (no Medicaid/ Medicare, etc.) -- The pt. MUST have a primary care doctor that directs their care regularly and follows them in the community   [facility]  [phone]   [employer]  [phone]    Agencies that provide inexpensive medical care: Organization         Address  Phone   Notes  [facility]  [phone]   [facility]    [phone]   [facility] [address] [place], [place] [address] [phone]   [facility] [address] [place] [place] [phone]   [facility]    [phone]   [facility]    [phone]   [facility]  [address], [place] Phone:  [phone], Fax:  [phone] Hours of Operation:  9 am - 6 pm, M-F.  Also accepts Medicaid/Medicare and self-pay.  [facility]  [address], [place] Phone: [phone], Fax: [phone]. Hours of Operation:  8:30 am - 5:30 pm, M-F.  Also accepts Medicaid and self-pay.  [facility] [place] [address] [place] Phone: [phone]   [facility] [address] Natasha Bence [place], [place] [phone], Ext. [phone] Mondays & Thursdays: 7-9 AM.  First 15 patients are seen on a first come, first serve basis.    Medicaid-accepting [facility] Providers:  Organization          Address  Phone   Notes  [facility] [address], [place] [phone] Also accepts self-pay patients.  [facility] [address] Laurell Josephs [place], [place]  [phone]   [facility] [address], [place] [phone]   [facility] [address] [place] [phone]   Renaye Rakers [address], [place]   [phone] Only accepts [place] patients after they have their name applied to their card.   Self-Pay (no insurance) in [employer]:  Organization         Address  Phone   Notes  Sickle Cell Patients, [facility] [address] [place], [place] [phone]   [facility] [place] [phone]   [facility] [place]  [place], [place] [phone]   [facility]/Dr. Osei-Bonsu  [address], [place] or [address], [place] [phone] Phone number for both [place] and [place] locations is the same.  [facility] [address] [place] [phone]   [facility] [address], [place] or [address] [phone]   [facility] [address] [place] [phone], phone; [phone], fax Sees patients 1st and 3rd Saturday of every month.  Must not qualify for public or private insurance (i.e. Medicaid, Medicare, [place] Health Choice, Veterans' Benefits)  Household income should be no more than 200% of the poverty level The clinic cannot treat you if you are pregnant or think you are pregnant  Sexually transmitted diseases are not treated at the clinic.

## 2018-06-04 NOTE — ED Notes (Signed)
"  Patient verbalizes understanding of discharge instructions. Opportunity for questioning and answers were provided.  pt discharged from ED ."  

## 2018-06-04 NOTE — Progress Notes (Signed)
Received call from ED staff at 1503 while I was monitoring a pt on 6 [place]. I was told that this pt was not emergent and I could come when I was finished. Pt is a G4P2 at 30 4/[redacted] weeks gestation. Says she has been having chest pain and shortness of breath for about three weeks. Says her chest pain eas worse this morning. She gets her care at [facility] in [place]. Says she has had vaginal births. Pt says her hemoglobin is low and she is scheduled for an iron infusion on Wednesday. Says she had iron infusions and two blood transfusions with her first pregnancy due to anemia. Denies vaginal bleeding or leaking of fluid. Says she is experiencing occasional mild cramping. She is talking on her phone at intervals and does not appear to be in any distress.

## 2018-06-07 ENCOUNTER — Other Ambulatory Visit: Payer: Self-pay

## 2018-06-08 ENCOUNTER — Inpatient Hospital Stay: Payer: Medicaid Other

## 2018-06-08 ENCOUNTER — Other Ambulatory Visit: Payer: Self-pay

## 2018-06-08 VITALS — BP 106/68 | HR 108 | Resp 18

## 2018-06-08 DIAGNOSIS — O99013 Anemia complicating pregnancy, third trimester: Secondary | ICD-10-CM | POA: Diagnosis not present

## 2018-06-08 MED ORDER — SODIUM CHLORIDE 0.9 % IV SOLN
510.0000 mg | Freq: Once | INTRAVENOUS | Status: AC
  Administered 2018-06-08: 510 mg via INTRAVENOUS
  Filled 2018-06-08: qty 17

## 2018-06-08 MED ORDER — SODIUM CHLORIDE 0.9 % IV SOLN
Freq: Once | INTRAVENOUS | Status: AC
  Administered 2018-06-08: 14:00:00 via INTRAVENOUS
  Filled 2018-06-08: qty 250

## 2018-06-09 ENCOUNTER — Emergency Department: Payer: Medicaid Other

## 2018-06-09 ENCOUNTER — Other Ambulatory Visit: Payer: Self-pay

## 2018-06-09 ENCOUNTER — Emergency Department
Admission: EM | Admit: 2018-06-09 | Discharge: 2018-06-09 | Disposition: A | Discharge status: Home / Self Care | Payer: Medicaid Other | Attending: Emergency Medicine | Admitting: Emergency Medicine

## 2018-06-09 ENCOUNTER — Encounter: Payer: Self-pay | Admitting: Emergency Medicine

## 2018-06-09 DIAGNOSIS — R0609 Other forms of dyspnea: Secondary | ICD-10-CM | POA: Diagnosis not present

## 2018-06-09 DIAGNOSIS — F1210 Cannabis abuse, uncomplicated: Secondary | ICD-10-CM | POA: Insufficient documentation

## 2018-06-09 DIAGNOSIS — R05 Cough: Secondary | ICD-10-CM | POA: Insufficient documentation

## 2018-06-09 DIAGNOSIS — J45909 Unspecified asthma, uncomplicated: Secondary | ICD-10-CM | POA: Insufficient documentation

## 2018-06-09 DIAGNOSIS — Z3A31 31 weeks gestation of pregnancy: Secondary | ICD-10-CM | POA: Insufficient documentation

## 2018-06-09 DIAGNOSIS — Z87891 Personal history of nicotine dependence: Secondary | ICD-10-CM | POA: Diagnosis not present

## 2018-06-09 DIAGNOSIS — Z79899 Other long term (current) drug therapy: Secondary | ICD-10-CM | POA: Insufficient documentation

## 2018-06-09 DIAGNOSIS — R0602 Shortness of breath: Secondary | ICD-10-CM | POA: Diagnosis not present

## 2018-06-09 DIAGNOSIS — R058 Other specified cough: Secondary | ICD-10-CM

## 2018-06-09 DIAGNOSIS — Z3493 Encounter for supervision of normal pregnancy, unspecified, third trimester: Secondary | ICD-10-CM

## 2018-06-09 DIAGNOSIS — O26893 Other specified pregnancy related conditions, third trimester: Secondary | ICD-10-CM | POA: Diagnosis present

## 2018-06-09 LAB — CBC
HCT: 27.5 % — ABNORMAL LOW (ref 36.0–46.0)
Hemoglobin: 7.9 g/dL — ABNORMAL LOW (ref 12.0–15.0)
MCH: 21.6 pg — ABNORMAL LOW (ref 26.0–34.0)
MCHC: 28.7 g/dL — ABNORMAL LOW (ref 30.0–36.0)
MCV: 75.1 fL — ABNORMAL LOW (ref 80.0–100.0)
Platelets: 193 10*3/uL (ref 150–400)
RBC: 3.66 MIL/uL — ABNORMAL LOW (ref 3.87–5.11)
RDW: 18.2 % — ABNORMAL HIGH (ref 11.5–15.5)
WBC: 9.2 10*3/uL (ref 4.0–10.5)
nRBC: 0.5 % — ABNORMAL HIGH (ref 0.0–0.2)

## 2018-06-09 LAB — COMPREHENSIVE METABOLIC PANEL
ALT: 10 U/L (ref 0–44)
AST: 16 U/L (ref 15–41)
Albumin: 3.2 g/dL — ABNORMAL LOW (ref 3.5–5.0)
Alkaline Phosphatase: 82 U/L (ref 38–126)
Anion gap: 13 (ref 5–15)
BUN: 7 mg/dL (ref 6–20)
CO2: 16 mmol/L — ABNORMAL LOW (ref 22–32)
Calcium: 9 mg/dL (ref 8.9–10.3)
Chloride: 104 mmol/L (ref 98–111)
Creatinine, Ser: 0.61 mg/dL (ref 0.44–1.00)
GFR calc Af Amer: >60 mL/min (ref >60)
GFR calc non Af Amer: >60 mL/min (ref >60)
Glucose, Bld: 80 mg/dL (ref 70–99)
POTASSIUM: 3.7 mmol/L (ref 3.5–5.1)
Sodium: 133 mmol/L — ABNORMAL LOW (ref 135–145)
Total Bilirubin: 0.8 mg/dL (ref 0.3–1.2)
Total Protein: 7.1 g/dL (ref 6.5–8.1)

## 2018-06-09 LAB — INFLUENZA PANEL BY PCR (TYPE A & B)
Influenza A By PCR: NEGATIVE
Influenza B By PCR: NEGATIVE

## 2018-06-09 MED ORDER — SODIUM CHLORIDE 0.9 % IV BOLUS
500.0000 mL | Freq: Once | INTRAVENOUS | Status: AC
  Administered 2018-06-09: 500 mL via INTRAVENOUS

## 2018-06-09 MED ORDER — ONDANSETRON HCL 4 MG/2ML IJ SOLN
INTRAMUSCULAR | Status: AC
  Filled 2018-06-09: qty 2

## 2018-06-09 NOTE — ED Notes (Signed)
Patient verbalizes understanding of discharge instructions.  Patient AAOx4. Vitals stable. NAD.

## 2018-06-09 NOTE — Discharge Instructions (Signed)
Person Under Monitoring Name: Kristy Reid  Location: [address] [place] [address]   Infection Prevention Recommendations for Individuals Confirmed to have, or Being Evaluated for, 2019 Novel Coronavirus (COVID-19) Infection Who Receive Care at Home  Individuals who are confirmed to have, or are being evaluated for, COVID-19 should follow the prevention steps below until a healthcare provider or local or state health department says they can return to normal activities.  Stay home except to get medical care You should restrict activities outside your home, except for getting medical care. Do not go to work, school, or public areas, and do not use public transportation or taxis.  Call ahead before visiting your doctor Before your medical appointment, call the healthcare provider and tell them that you have, or are being evaluated for, COVID-19 infection. This will help the healthcare providers office take steps to keep other people from getting infected. Ask your healthcare provider to call the local or state health department.  Monitor your symptoms Seek prompt medical attention if your illness is worsening (e.g., difficulty breathing). Before going to your medical appointment, call the healthcare provider and tell them that you have, or are being evaluated for, COVID-19 infection. Ask your healthcare provider to call the local or state health department.  Wear a facemask You should wear a facemask that covers your nose and mouth when you are in the same room with other people and when you visit a healthcare provider. People who live with or visit you should also wear a facemask while they are in the same room with you.  Separate yourself from other people in your home As much as possible, you should stay in a different room from other people in your home. Also, you should use a separate bathroom, if available.  Avoid sharing household items You  should not share dishes, drinking glasses, cups, eating utensils, towels, bedding, or other items with other people in your home. After using these items, you should wash them thoroughly with soap and water.  Cover your coughs and sneezes Cover your mouth and nose with a tissue when you cough or sneeze, or you can cough or sneeze into your sleeve. Throw used tissues in a lined trash can, and immediately wash your hands with soap and water for at least 20 seconds or use an alcohol-based hand rub.  Wash your [employer] your hands often and thoroughly with soap and water for at least 20 seconds. You can use an alcohol-based hand sanitizer if soap and water are not available and if your hands are not visibly dirty. Avoid touching your eyes, nose, and mouth with unwashed hands.   Prevention Steps for Caregivers and Household Members of Individuals Confirmed to have, or Being Evaluated for, COVID-19 Infection Being Cared for in the Home  If you live with, or provide care at home for, a person confirmed to have, or being evaluated for, COVID-19 infection please follow these guidelines to prevent infection:  Follow healthcare providers instructions Make sure that you understand and can help the patient follow any healthcare provider instructions for all care.  Provide for the patients basic needs You should help the patient with basic needs in the home and provide support for getting groceries, prescriptions, and other personal needs.  Monitor the patients symptoms If they are getting sicker, call his or her medical provider and tell them that the patient has, or is being evaluated for, COVID-19 infection. This will help the  healthcare providers office take steps to keep other people from getting infected. Ask the healthcare provider to call the local or state health department.  Limit the number of people who have contact with the patient If possible, have only one caregiver for the  patient. Other household members should stay in another home or place of residence. If this is not possible, they should stay in another room, or be separated from the patient as much as possible. Use a separate bathroom, if available. Restrict visitors who do not have an essential need to be in the home.  Keep older adults, very young children, and other sick people away from the patient Keep older adults, very young children, and those who have compromised immune systems or chronic health conditions away from the patient. This includes people with chronic heart, lung, or kidney conditions, diabetes, and cancer.  Ensure good ventilation Make sure that shared spaces in the home have good air flow, such as from an air conditioner or an opened window, weather permitting.  Wash your hands often Wash your hands often and thoroughly with soap and water for at least 20 seconds. You can use an alcohol based hand sanitizer if soap and water are not available and if your hands are not visibly dirty. Avoid touching your eyes, nose, and mouth with unwashed hands. Use disposable paper towels to dry your hands. If not available, use dedicated cloth towels and replace them when they become wet.  Wear a facemask and gloves Wear a disposable facemask at all times in the room and gloves when you touch or have contact with the patients blood, body fluids, and/or secretions or excretions, such as sweat, saliva, sputum, nasal mucus, vomit, urine, or feces.  Ensure the mask fits over your nose and mouth tightly, and do not touch it during use. Throw out disposable facemasks and gloves after using them. Do not reuse. Wash your hands immediately after removing your facemask and gloves. If your personal clothing becomes contaminated, carefully remove clothing and launder. Wash your hands after handling contaminated clothing. Place all used disposable facemasks, gloves, and other waste in a lined container before  disposing them with other household waste. Remove gloves and wash your hands immediately after handling these items.  Do not share dishes, glasses, or other household items with the patient Avoid sharing household items. You should not share dishes, drinking glasses, cups, eating utensils, towels, bedding, or other items with a patient who is confirmed to have, or being evaluated for, COVID-19 infection. After the person uses these items, you should wash them thoroughly with soap and water.  Wash laundry thoroughly Immediately remove and wash clothes or bedding that have blood, body fluids, and/or secretions or excretions, such as sweat, saliva, sputum, nasal mucus, vomit, urine, or feces, on them. Wear gloves when handling laundry from the patient. Read and follow directions on labels of laundry or clothing items and detergent. In general, wash and dry with the warmest temperatures recommended on the label.  Clean all areas the individual has used often Clean all touchable surfaces, such as counters, tabletops, doorknobs, bathroom fixtures, toilets, phones, keyboards, tablets, and bedside tables, every day. Also, clean any surfaces that may have blood, body fluids, and/or secretions or excretions on them. Wear gloves when cleaning surfaces the patient has come in contact with. Use a diluted bleach solution (e.g., dilute bleach with 1 part bleach and 10 parts water) or a household disinfectant with a label that says EPA-registered for coronaviruses. To  make a bleach solution at home, add 1 tablespoon of bleach to 1 quart (4 cups) of water. For a larger supply, add  cup of bleach to 1 gallon (16 cups) of water. Read labels of cleaning products and follow recommendations provided on product labels. Labels contain instructions for safe and effective use of the cleaning product including precautions you should take when applying the product, such as wearing gloves or eye protection and making sure you  have good ventilation during use of the product. Remove gloves and wash hands immediately after cleaning.  Monitor yourself for signs and symptoms of illness Caregivers and household members are considered close contacts, should monitor their health, and will be asked to limit movement outside of the home to the extent possible. Follow the monitoring steps for close contacts listed on the symptom monitoring form.   ? If you have additional questions, contact your local health department or call the epidemiologist on call at [phone] (available 24/7). ? This guidance is subject to change. For the most up-to-date guidance from [facility], please refer to their website: [email or web address]

## 2018-06-09 NOTE — ED Provider Notes (Signed)
[facility] Emergency Department Provider Note   ____________________________________________   First MD Initiated Contact with Patient 06/09/18 1236     (approximate)  I have reviewed the triage vital signs and the nursing notes.   HISTORY  Chief Complaint Shortness of Breath    HPI Kristy Reid is a 23 y.o. female reports for over a month now she has had intermittent feeling of shortness of breath.  This is accompanied by slight chest discomfort is hard to describe it is been present as well for about a month.  Comes and goes, not sure why.  She follows with [facility] and had an iron infusion for this yesterday and reports best thought is that this is due to her anemia.  She had anemia before during pregnancy as well.  Denies bleeding.  She has been feeling her baby move normally.   Past Medical History:  Diagnosis Date  . Anemia   . Anemia   . Anxiety   . Asthma   . Bipolar 1 disorder (HCC)   . Chronic back pain   . Depression   . Insomnia   . Personality disorder (HCC)    "boarderline"  . PTSD (post-traumatic stress disorder)     Patient Active Problem List   Diagnosis Date Noted  . Preterm contractions 06/03/2018  . Anemia affecting pregnancy in third trimester 05/30/2018  . Limited prenatal care in second trimester 05/19/2018  . Depression with suicidal ideation 05/04/2018  . Alcohol abuse 05/04/2018  . Schizoaffective disorder, bipolar type (HCC) 05/04/2018  . Anxiety disorder   . Bipolar 2 disorder (HCC) 11/11/2016  . Noncompliance 11/11/2016  . Severe recurrent major depression with psychotic features (HCC) 07/18/2016  . MDD (major depressive disorder), recurrent, severe, with psychosis (HCC) 07/17/2016  . Normal labor 05/07/2016  . Labor and delivery, indication for care 04/19/2016  . Indication for care in labor and delivery, antepartum 04/06/2016  . Decreased fetal movement 03/22/2016  . Abdominal pain in pregnancy  01/21/2016  . Constipation during pregnancy in second trimester 12/26/2015  . Overdose 11/29/2015  . Supervision of high risk pregnancy, antepartum 11/15/2015  . Cannabis use disorder, severe, dependence (HCC) 11/15/2015  . Tobacco use disorder 11/15/2015  . Asthma 11/15/2015  . Borderline personality disorder (HCC) 11/15/2015  . PTSD (post-traumatic stress disorder) 11/15/2015  . Severe recurrent major depression without psychotic features (HCC) 11/13/2015    Past Surgical History:  Procedure Laterality Date  . BUNIONECTOMY Bilateral    feet  . wrist sugery      Prior to Admission medications   Medication Sig Start Date End Date Taking? Authorizing Provider  albuterol (PROVENTIL HFA;VENTOLIN HFA) 108 (90 Base) MCG/ACT inhaler Inhale 2 puffs into the lungs every 6 (six) hours as needed for wheezing or shortness of breath.     [provider]  butalbital-acetaminophen-caffeine (FIORICET, ESGIC) 50-325-40 MG tablet Take 1-2 tablets by mouth every 6 (six) hours as needed for headache. Patient taking differently: Take 1 tablet by mouth every 6 (six) hours as needed for headache.  05/23/18 05/23/19  Oswaldo Conroy, CNM  calcium carbonate (TUMS - DOSED IN MG ELEMENTAL CALCIUM) 500 MG chewable tablet Chew 2 tablets by mouth daily as needed for indigestion or heartburn.    [provider]  citalopram (CELEXA) 10 MG tablet Take 1 tablet (10 mg total) by mouth daily. 05/06/18   [name], [name], MD  cyclobenzaprine (FLEXERIL) 10 MG tablet Take 1 tablet (10 mg total) by mouth  3 (three) times daily as needed. 05/05/18   [name], Jackquline DenmarkJohn T, MD  docusate sodium (COLACE) 100 MG capsule Take 2 capsules (200 mg total) by mouth 2 (two) times daily. 05/05/18   [name], Jackquline DenmarkJohn T, MD  famotidine (PEPCID) 20 MG tablet Take 1 tablet (20 mg total) by mouth 2 (two) times daily. 05/05/18   [name], Jackquline DenmarkJohn T, MD  ferrous sulfate 325 (65 FE) MG tablet Take 1 tablet (325 mg total) by mouth daily with breakfast.  05/05/18   [name], Jackquline DenmarkJohn T, MD  fluticasone (FLONASE) 50 MCG/ACT nasal spray Place 1 spray into both nostrils 2 (two) times daily. 05/05/18   [name], Jackquline DenmarkJohn T, MD  folic acid (FOLVITE) 1 MG tablet Take 1 tablet (1 mg total) by mouth daily. 05/05/18   [name], Jackquline DenmarkJohn T, MD  loratadine (CLARITIN) 10 MG tablet Take 1 tablet (10 mg total) by mouth daily. 05/05/18   [name], Jackquline DenmarkJohn T, MD  Prenat-Fe Poly-Methfol-FA-DHA (VITAFOL FE+) 90-0.6-0.4-200 MG CAPS Take 1 tablet by mouth daily. Patient not taking: Reported on 06/04/2018 06/02/18   Natale MilchSchuman, Christanna R, MD  promethazine (PHENERGAN) 25 MG tablet Take 1 tablet (25 mg total) by mouth every 6 (six) hours as needed for nausea or vomiting. 05/05/18   [name], Jackquline DenmarkJohn T, MD  traZODone (DESYREL) 150 MG tablet Take 1 tablet (150 mg total) by mouth at bedtime. 05/05/18   [name], Jackquline DenmarkJohn T, MD    Allergies Penicillins; Rocephin [ceftriaxone]; Zithromax [azithromycin]; and Lidocaine  Family History  Problem Relation Age of Onset  . Diabetes Mother   . Hypertension Mother   . Mental illness Mother   . Diabetes Maternal Grandmother   . Heart disease Maternal Grandmother   . Cancer Neg Hx   . Ovarian cancer Neg Hx     Social History Social History   Tobacco Use  . Smoking status: Former Smoker    Types: Cigarettes    Last attempt to quit: 08/13/2015    Years since quitting: 2.8  . Smokeless tobacco: Never Used  Substance Use Topics  . Alcohol use: No  . Drug use: Yes    Frequency: 7.0 times per week    Types: Marijuana    Comment: daily use, last use yesterday    Review of Systems Constitutional: No fever/chills but does report she feels hot at times, cool at other times.  Not sure if it is hormonal. Eyes: No visual changes. ENT: No sore throat. Cardiovascular: Denies chest pain except for a hard to describe feeling in her chest that is been present off and on for a month. Respiratory: Denies shortness of breath presently, but reports sometimes when she  gets up and moves about she will feel slight feeling of shortness of breath.  Dry cough. Gastrointestinal: No abdominal pain.   Genitourinary: Negative for dysuria. Musculoskeletal: Negative for back pain.  No myalgias. Skin: Negative for rash. Neurological: Negative for headaches, areas of focal weakness or numbness.    ____________________________________________   PHYSICAL EXAM:  VITAL SIGNS: ED Triage Vitals  Enc Vitals Group     BP 06/09/18 1208 103/69     Pulse Rate 06/09/18 1205 (!) 119     Resp 06/09/18 1205 20     Temp 06/09/18 1205 (!) 97.5 F (36.4 C)     Temp Source 06/09/18 1205 Oral     SpO2 06/09/18 1205 100 %     Weight --      Height --      Head Circumference --  Peak Flow --      Pain Score 06/09/18 1206 6     Pain Loc --      Pain Edu? --      Excl. in GC? --     Constitutional: Alert and oriented. Well appearing and in no acute distress. Eyes: Conjunctivae are normal. Head: Atraumatic. Nose: No congestion/rhinnorhea. Mouth/Throat: Mucous membranes are moist. Neck: No stridor.  Cardiovascular: Slight tachycardic rate, regular rhythm. Grossly normal heart sounds.  Good peripheral circulation. Respiratory: Normal respiratory effort.  No retractions. Lungs CTAB. Gastrointestinal: Soft and nontender. No distention.  Obviously gravid. Musculoskeletal: No lower extremity tenderness nor edema.  No signs or symptoms suggest DVT.  There is no edema. Neurologic:  Normal speech and language. No gross focal neurologic deficits are appreciated.  Skin:  Skin is warm, dry and intact. No rash noted. Psychiatric: Mood and affect are normal. Speech and behavior are normal.  ____________________________________________   LABS (all labs ordered are listed, but only abnormal results are displayed)  Labs Reviewed  CBC - Abnormal; Notable for the following components:      Result Value   RBC 3.66 (*)    Hemoglobin 7.9 (*)    HCT 27.5 (*)    MCV 75.1 (*)     MCH 21.6 (*)    MCHC 28.7 (*)    RDW 18.2 (*)    nRBC 0.5 (*)    All other components within normal limits  COMPREHENSIVE METABOLIC PANEL - Abnormal; Notable for the following components:   Sodium 133 (*)    CO2 16 (*)    Albumin 3.2 (*)    All other components within normal limits  INFLUENZA PANEL BY PCR (TYPE A & B)   ____________________________________________  EKG  Reviewed entered by me at 1215 Heart rate 120 QRS 80 QTc 430 Sinus tachycardia, no evidence of acute ischemia ____________________________________________  RADIOLOGY  Chest x-ray reviewed negative for acute ____________________________________________   PROCEDURES  Procedure(s) performed: None  Procedures  Critical Care performed: No  ____________________________________________   INITIAL IMPRESSION / ASSESSMENT AND PLAN / ED COURSE  Pertinent labs & imaging results that were available during my care of the patient were reviewed by me and considered in my medical decision making (see chart for details).   Patient comes for evaluation of intermittent chest tightness, intermittent dyspnea more so with exertion.  Also reports that she has had a dry cough, cold feeling warm at times.  Overall well-appearing and nontoxic.  Just slightly tachycardic.  No clinical exam evidence of preeclampsia.  Normal heart tones.  Previous work-up including EKGs and as well as a CT angiogram for same symptoms without pulmonary embolism.  Vague sense of chest discomfort intermittently with some slight shortness of breath.  Quite possibly attributable to her anemia, but also given her dry cough we will check an influenza test.  I feel she is very low risk to have coronavirus at this time    Case and care discussed with Dr. Jean Rosenthal, OBGYN. Discussed symptoms and work-up. HR now 100, improved with IV fluids. Resting comfortably. Dr. Jean Rosenthal advises can't think of any further work-up that hasn't been recently done at this time.  Consider echo if she had edema, edema on cxr. No cardiomegaly on recent CT either.   The patient's hemoglobin is at baseline, slightly improved actually.  CBC no acute concerns.  Chest x-ray clear.  She had a large work-up her dyspnea already is currently resting comfortably.  ----------------------------------------- 3:01 PM on 06/09/2018 -----------------------------------------  Patient feels well.  No distress.  As she reports cough, people in her household with similar symptoms over the last couple of days out of abundance of precaution I recommended that she self quarantine and gave coronavirus instructions.  She appears very well and I clinically doubt this is coronavirus, but most of caution have advised this to her.  She is agreeable to follow-up with Dr. Jean Rosenthal of gynecology.  ____________________________________________   FINAL CLINICAL IMPRESSION(S) / ED DIAGNOSES  Final diagnoses:  Third trimester pregnancy  Dyspnea on exertion  Dry cough        Note:  This document was prepared using Dragon voice recognition software and may include unintentional dictation errors       Sharyn Creamer, MD 06/09/18 1502

## 2018-06-09 NOTE — ED Triage Notes (Signed)
Pt via EMS from home with c/o SOB, hx of iron defeciency with same symptoms. Pt received iron transfusion yesterday. PT hx of asthma. PT in NAD. Pt 31wks preg.

## 2018-06-13 ENCOUNTER — Other Ambulatory Visit: Payer: Self-pay

## 2018-06-14 ENCOUNTER — Other Ambulatory Visit: Payer: Self-pay

## 2018-06-14 ENCOUNTER — Inpatient Hospital Stay: Payer: Medicaid Other

## 2018-06-16 ENCOUNTER — Other Ambulatory Visit: Payer: Self-pay

## 2018-06-16 ENCOUNTER — Encounter: Payer: Self-pay | Admitting: Obstetrics and Gynecology

## 2018-06-16 ENCOUNTER — Ambulatory Visit (INDEPENDENT_AMBULATORY_CARE_PROVIDER_SITE_OTHER): Payer: Medicaid Other | Admitting: Obstetrics and Gynecology

## 2018-06-16 VITALS — BP 122/74 | Wt 188.0 lb

## 2018-06-16 DIAGNOSIS — O0933 Supervision of pregnancy with insufficient antenatal care, third trimester: Secondary | ICD-10-CM

## 2018-06-16 DIAGNOSIS — O99013 Anemia complicating pregnancy, third trimester: Secondary | ICD-10-CM

## 2018-06-16 DIAGNOSIS — O99613 Diseases of the digestive system complicating pregnancy, third trimester: Secondary | ICD-10-CM

## 2018-06-16 DIAGNOSIS — Z3A32 32 weeks gestation of pregnancy: Secondary | ICD-10-CM

## 2018-06-16 DIAGNOSIS — K5900 Constipation, unspecified: Secondary | ICD-10-CM

## 2018-06-16 DIAGNOSIS — O99612 Diseases of the digestive system complicating pregnancy, second trimester: Secondary | ICD-10-CM

## 2018-06-16 DIAGNOSIS — O0932 Supervision of pregnancy with insufficient antenatal care, second trimester: Secondary | ICD-10-CM

## 2018-06-16 DIAGNOSIS — O0990 Supervision of high risk pregnancy, unspecified, unspecified trimester: Secondary | ICD-10-CM

## 2018-06-16 NOTE — Progress Notes (Signed)
Routine Prenatal Care Visit  Subjective  Kristy Reid is a 23 y.o. [phone] at [redacted]w[redacted]d being seen today for ongoing prenatal care.  She is currently monitored for the following issues for this high-risk pregnancy and has Severe recurrent major depression without psychotic features (HCC); Supervision of high risk pregnancy, antepartum; Cannabis use disorder, severe, dependence (HCC); Tobacco use disorder; Asthma; Borderline personality disorder (HCC); PTSD (post-traumatic stress disorder); Overdose; Constipation during pregnancy in second trimester; Abdominal pain in pregnancy; Decreased fetal movement; Indication for care in labor and delivery, antepartum; Labor and delivery, indication for care; Normal labor; MDD (major depressive disorder), recurrent, severe, with psychosis (HCC); Severe recurrent major depression with psychotic features (HCC); Bipolar 2 disorder (HCC); Noncompliance; Anxiety disorder; Depression with suicidal ideation; Alcohol abuse; Schizoaffective disorder, bipolar type (HCC); Limited prenatal care in second trimester; Anemia affecting pregnancy in third trimester; and Preterm contractions on their problem list.  ----------------------------------------------------------------------------------- Patient reports continued difficulty breathing, but no worsening of symptoms for over 1 month. She was seen in the ER on 3/26 for similar symptoms and apart from tachycardia (sinus), she appeared overall well. She was felt to be low likelihood of Coronavirus infection. However, out of an abundance of caution, she was asked to self quarantine. She presents to day, in-person, with no mask.  She denies cough, fevers, chills (apart from being hot at times and with occasional body aches)..   Contractions: Not present. Vag. Bleeding: None.  Movement: Present. Denies leaking of fluid.  ----------------------------------------------------------------------------------- The following portions of the  patient's history were reviewed and updated as appropriate: allergies, current medications, past family history, past medical history, past social history, past surgical history and problem list. Problem list updated.   Objective  Blood pressure 122/74, weight 188 lb (85.3 kg), last menstrual period 11/02/2017, unknown if currently breastfeeding. Pregravid weight 150 lb (68 kg) Total Weight Gain 38 lb (17.2 kg) Urinalysis: Urine Protein    Urine Glucose    Fetal Status: Fetal Heart Rate (bpm): 145 Fundal Height: 32 cm Movement: Present     General:  Alert, oriented and cooperative. Patient is in no acute distress.  Skin: Skin is warm and dry. No rash noted.   Cardiovascular: Normal heart rate noted  Respiratory: Normal respiratory effort, no problems with respiration noted  Abdomen: Soft, gravid, appropriate for gestational age. Pain/Pressure: Absent     Pelvic:  Cervical exam deferred        Extremities: Normal range of motion.     Mental Status: Normal mood and affect. Normal behavior. Normal judgment and thought content.   Assessment   23 y.o. [id number] at [redacted]w[redacted]d by  08/09/2018, by Last Menstrual Period presenting for routine prenatal visit  Plan   pregnancy 4 Problems (from 01/02/18 to present)    Problem Noted Resolved   Supervision of high risk pregnancy, antepartum 11/15/2015 by Jimmy Footman, MD No   Overview Addendum 06/06/2018 11:34 AM by Natale Milch, MD    Clinic [facility] Prenatal Labs  Dating [place] Blood type:   O+  Genetic Screen In [place], reported normal Antibody:   Anatomic [place] scheduled Rubella:   Varicella:   GTT Early:    WNL   Third trimester:  RPR:     Rhogam na HBsAg:     TDaP vaccine    06/02/2018          Flu Shot: HIV:     Baby Food      Breast  GBS:   Contraception  Undecided, considering BTL Pap:  CBB     CS/VBAC    Support Person                Preterm labor symptoms and general obstetric precautions  including but not limited to vaginal bleeding, contractions, leaking of fluid and fetal movement were reviewed in detail with the patient. Please refer to After Visit Summary for other counseling recommendations.   -advised the patient to follow self-quarantine guidelines and STAY AT HOME unless she has any new symptoms. THese are outlined in the paperwork she has from her visit at the ER.  WIll perform next visit via telephone. She was positive for the flu about 1 month ago.    Return in about 2 weeks (around 06/30/2018) for Routine Prenatal Appointment/ Telephone.  Thomasene Mohair, MD, [facility], [facility] 06/16/2018 11:47 AM

## 2018-06-20 ENCOUNTER — Telehealth: Payer: Self-pay

## 2018-06-20 NOTE — Telephone Encounter (Signed)
LMVM TRC. Notified rcv'd After hours Triage message & didn't see that she had reported to L&D. Pt advised to contact us if she was still in need of assistance.

## 2018-06-20 NOTE — Telephone Encounter (Signed)
Patient called after hours Triage stating she is [redacted] wks pregnant & having n/v & contractions 3-4 minutes apart. Triage nurse advised to report to L&D. Chart reviewed. No L&D Visit seen in Epic.

## 2018-06-20 NOTE — Telephone Encounter (Signed)
Spoke w/pt. She is still having contractions. They are not as close but seem to be stronger. She states she has been vomiting since Saturday. Drinking gatorade but unable to keep that down. She also is having trouble breathing. Advised that evaluation is needed. Seems reluctant to report to L&D as was advised by after hours service. Prefers to speak/be advised by provider.

## 2018-06-27 ENCOUNTER — Inpatient Hospital Stay: Payer: Medicaid Other

## 2018-06-27 LAB — MICROSCOPIC URINALYSIS
Casts UA: 0 /LPF (ref 0–8)
Epithelial Cells UA: 5 /HPF (ref 0–5)
WBC, UA: 5 /HPF (ref 0–5)

## 2018-06-27 LAB — URINALYSIS WITH REFLEX TO CULTURE
Bilirubin Urine: NEGATIVE
Glucose, Ur: NEGATIVE
Ketones, Urine: NEGATIVE
Nitrite, Urine: NEGATIVE
Protein, UA: NEGATIVE
Specific Gravity, UA: 1.016 (ref 1.005–1.030)
Urine Hgb: NEGATIVE
Urobilinogen, Urine: NORMAL
pH, UA: 7 (ref 5.0–8.0)

## 2018-06-27 LAB — VAGINITIS DNA PROBE
Direct Exam: NEGATIVE
Direct Exam: NEGATIVE
Direct Exam: POSITIVE — AB

## 2018-06-27 MED ORDER — ONDANSETRON 4 MG PO TBDP
4 MG | Freq: Three times a day (TID) | ORAL | Status: DC | PRN
Start: 2018-06-27 — End: 2018-06-27

## 2018-06-27 MED ORDER — ONDANSETRON HCL 4 MG/2ML IJ SOLN
4 MG/2ML | Freq: Four times a day (QID) | INTRAMUSCULAR | Status: DC | PRN
Start: 2018-06-27 — End: 2018-06-27

## 2018-06-27 MED ORDER — ACETAMINOPHEN 325 MG PO TABS
325 MG | ORAL | Status: DC | PRN
Start: 2018-06-27 — End: 2018-06-27

## 2018-06-27 MED ORDER — METRONIDAZOLE 500 MG PO TABS
500 MG | ORAL_TABLET | Freq: Two times a day (BID) | ORAL | 0 refills | Status: AC
Start: 2018-06-27 — End: 2018-07-04

## 2018-06-27 MED FILL — ONDANSETRON 4 MG PO TBDP: 4 mg | ORAL | Qty: 1
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
## 2018-06-27 NOTE — Other (Signed)
Pt states she just moved back from [place]. Pt states having had prenatal care since 10 weeks.

## 2018-06-27 NOTE — Other (Signed)
Pt to ld for c/o cramping since Tuesday with spotting this morning. Pt denies srom. Positive fetal movement.

## 2018-06-27 NOTE — Other (Signed)
Discharge instructions given pt verbalized understanding.

## 2018-06-27 NOTE — Telephone Encounter (Signed)
Patient called to schedule a prenatal visit. She was previously a patient but moved to [place]. She is now back and needs to reestablish care. Her due date is 08/09/18.

## 2018-06-27 NOTE — Discharge Instructions (Signed)
Increase your fluids to 10-12 glasses of noncaffeinated beverages every day.    Contact your physician for signs of labor:  Regular and timeable contractions that are 5 minutes apart for 1 hour.  Bag of waters leaking or gush of fluids from the vagina.  Vaginal bleeding that is heavier than a menstrual period.  Decrease or absence of baby movement.    Vomiting or diarrhea for several hours.  Fever of 100.4 or higher.    F/U with [facility]

## 2018-06-28 ENCOUNTER — Inpatient Hospital Stay: Payer: Medicaid Other

## 2018-06-28 LAB — CULTURE, URINE: Culture: NO GROWTH

## 2018-06-28 NOTE — Telephone Encounter (Signed)
Patient contacted and scheduled for 07/01/18

## 2018-06-29 LAB — C.TRACHOMATIS N.GONORRHOEAE DNA
C. trachomatis DNA: NEGATIVE
N. gonorrhoeae DNA: NEGATIVE

## 2018-06-30 ENCOUNTER — Ambulatory Visit (INDEPENDENT_AMBULATORY_CARE_PROVIDER_SITE_OTHER): Payer: Medicaid Other | Admitting: Certified Nurse Midwife

## 2018-06-30 ENCOUNTER — Other Ambulatory Visit: Payer: Self-pay

## 2018-06-30 DIAGNOSIS — F250 Schizoaffective disorder, bipolar type: Secondary | ICD-10-CM

## 2018-06-30 DIAGNOSIS — O0990 Supervision of high risk pregnancy, unspecified, unspecified trimester: Secondary | ICD-10-CM

## 2018-06-30 DIAGNOSIS — O99013 Anemia complicating pregnancy, third trimester: Secondary | ICD-10-CM

## 2018-06-30 DIAGNOSIS — Z3A34 34 weeks gestation of pregnancy: Secondary | ICD-10-CM

## 2018-06-30 NOTE — Progress Notes (Signed)
ROB telephone visit Pt was seen at a hospital in [place] on 4/13, had UTI

## 2018-06-30 NOTE — Progress Notes (Signed)
Routine Prenatal Care Visit- Virtual Visit  Subjective   Virtual Visit via Telephone Note  I connected with Kristy Reid on 06/30/18 at  8:10 AM EDT by telephone and verified that I am speaking with the correct person using two identifiers.   I discussed the limitations, risks, security and privacy concerns of performing an evaluation and management service by telephone and the availability of in person appointments. I also discussed with the patient that there may be a patient responsible charge related to this service. The patient expressed understanding and agreed to proceed.  The patient was at home I spoke with the patient from my  workstation The names of people involved in this encounter were: Jasira , and Kim [place] , and myself .   Kristy Reid is a 23 y.o. [phone] at [redacted]w[redacted]d being seen today for ongoing prenatal care.  She is currently monitored for the following issues for this high-risk pregnancy and has Severe recurrent major depression without psychotic features (HCC); Supervision of high risk pregnancy, antepartum; Cannabis use disorder, severe, dependence (HCC); Tobacco use disorder; Asthma; Borderline personality disorder (HCC); PTSD (post-traumatic stress disorder); Overdose; Constipation during pregnancy in second trimester; Abdominal pain in pregnancy; Decreased fetal movement; Indication for care in labor and delivery, antepartum; Labor and delivery, indication for care; Normal labor; MDD (major depressive disorder), recurrent, severe, with psychosis (HCC); Severe recurrent major depression with psychotic features (HCC); Bipolar 2 disorder (HCC); Noncompliance; Anxiety disorder; Depression with suicidal ideation; Alcohol abuse; Schizoaffective disorder, bipolar type (HCC); Limited prenatal care in second trimester; Anemia affecting pregnancy in third trimester; and Preterm contractions on their problem list.   ----------------------------------------------------------------------------------- Patient reports that [place] out of state on Monday 4[place]13 for contractions, spotting and back pain. Baby's heart rate was 150 at that time. She reports that her cervix was closed and that she was started on Bactrim for treatment of a UTI.  Having some problems with her left hip going numb periodically. We also addressed her anemia: She has received one IV iron infusion and was to have another this past week but "forgot about it" and needs to reschedule. Stopped taking her vitamins and iron "because they were not helping". Last H&H was 7.9gm/dl and 32.2% Encouraged her to restart vitamins and iron. Discussed how to take. States that her SOB and CP have resolved. Schizoaffective disorder/ bipolar type: She is not taking any of her medications now. Stopped Celexa in her third trimester because wanted to prevent the baby from having withdrawal symptoms. (Had stopped Trazadone and Serroquel prior to that). Has not been seeing a mental health provider but is visited by a SW from [facility]. Feels like she is doing well emotionally at this point.  Wants to breast feed. Debating between Depo and the IUD for birth control We never received any prenatal records from [place] and her NOB labs have not been done. ROB, GBS, ABO&RH, antibody screen, Rubella, Varicella, HBsAg, and GC/Chlamydia and repeat UDS NV ----------------------------------------------------------------------------------- The following portions of the patient's history were reviewed and updated as appropriate: allergies, current medications, past family history, past medical history, past social history, past surgical history and problem list. Problem list updated.   Objective  Last menstrual period 11/02/2017, unknown if currently breastfeeding. Pregravid weight 68 kg Total Weight Gain 17.2 kg  Physical Exam could not be performed. Because of the COVID-19 outbreak  this visit was performed over the phone and not in person.   Assessment   22 y.o. [id number] at  4858w2d by  08/09/2018, by Last Menstrual Period presenting for high risk prenatal visit via phone. Declined in person visit  Plan   pregnancy 4 Problems (from 01/02/18 to present)    Problem Noted Resolved   Supervision of high risk pregnancy, antepartum 11/15/2015 by Jimmy FootmanHernandez-Gonzalez, Andrea, MD No   Overview Addendum 06/06/2018 11:34 AM by Natale MilchSchuman, Christanna R, MD    [facility]renatal [facility]  Dating [place] Blood type:   O+  Genetic Screen In [place], reported normal Antibody:   Anatomic US scheduled Rubella:   Varicella:   GTT Early:    WNL   Third trimester: 97 RPR:     Rhogam na HBsAg:     TDaP vaccine    06/02/2018          Flu Shot: HIV:     Baby Food      Breast                          GBS:   Contraception Depo vs IUD Pap:  CBB     CS/VBAC    Support Person                Gestational age appropriate obstetric precautions including but not limited to vaginal bleeding, contractions, leaking of fluid and fetal movement were reviewed in detail with the patient.    ROB/ labs/cultures in 2 weeks    I discussed the assessment and treatment plan with the patient. The patient was provided an opportunity to ask questions and all were answered. The patient agreed with the plan and demonstrated an understanding of the instructions.    I provided 20 minutes of non-face-to-face time during this encounter.    [facility], [facility] 06/30/2018 8:41 AM

## 2018-07-01 ENCOUNTER — Encounter
Payer: Medicaid Other | Attending: Student in an Organized Health Care Education/Training Program | Primary: Family Medicine

## 2018-07-07 ENCOUNTER — Ambulatory Visit: Payer: Self-pay

## 2018-07-14 ENCOUNTER — Encounter: Payer: Medicaid Other | Admitting: Maternal Newborn

## 2018-07-14 NOTE — Telephone Encounter (Signed)
Obstetric/Gynecology Resident Interval Note    Pt called [facility] L&D concerning "low Hgb levels". She has been having intermittent chest pain and shortness of breath with ambulation and improves with rest. She has hx of needing IV iron infusions down in [place] where she recently moved from to the [place] area. She received a phone call from her Ob/Gyn in [place] yesterday on 07/13/18 stating her Hgb is low below 7. Her hemoglobin has been as low as 5 in the past. Pt has appt with [facility] 07/19/18. Advised pt to present to the nearest ED which is [facility] for further evaluation as soon as possible.    Dr. Ninfa Meeker updated and agreeable to plan.  ER made aware of pt's arrival.    Arnell Asal, DO  OBGYN Resident, PGY2  [facility]  07/14/2018, 8:19 AM

## 2018-07-15 ENCOUNTER — Inpatient Hospital Stay
Admit: 2018-07-15 | Discharge: 2018-07-16 | Disposition: A | Discharge status: Home / Self Care | Payer: Medicaid Other | Attending: Emergency Medicine

## 2018-07-15 ENCOUNTER — Emergency Department: Admit: 2018-07-15 | Payer: Medicaid Other | Primary: Family Medicine

## 2018-07-15 ENCOUNTER — Emergency Department: Admit: 2018-07-16 | Payer: Medicaid Other | Primary: Family Medicine

## 2018-07-15 DIAGNOSIS — O26893 Other specified pregnancy related conditions, third trimester: Secondary | ICD-10-CM
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
## 2018-07-15 NOTE — ED Provider Notes (Signed)
[facility] ED  eMERGENCY dEPARTMENT eNCOUnter    Pt Name: Brenda Zamora  MRN: [id number]  Birthdate: October 20, 1995  Date of evaluation:07/15/18  PCP: Wilmer Floor, MD    CHIEF COMPLAINT       Chief Complaint   Patient presents with   ??? Chest Pain   ??? Shortness of Breath       HISTORY OF PRESENT ILLNESS    Brenda Zamora is a 23 y.o. female who presents with a chief complaint of chest pain and shortness of breath.  Patient states symptoms have been progressing over the past several days.  She states symptoms are worse with exertion.  She states that pain is not present at this time and she does not feel short of breath at this time at rest.  Denies any recent fevers, chills or other illnesses.  States she feels fine otherwise.  No cough, congestion, sore throat.  No GI symptoms.  She is [redacted] weeks pregnant.  She states she was told to come to the hospital by her OB/GYN.  She has required blood transfusions in the past that she does have a history of iron deficiency anemia.  She states she was supposed to get one several weeks back however she missed the appointment work.  No abdominal pain, nausea, vomiting, changes in bowel or bladder habits.  No vaginal bleeding or discharge.  No other complaints at this time.      REVIEW OF SYSTEMS       Review of Systems   Constitutional: Negative for chills, fatigue and fever.   HENT: Negative for congestion, ear pain, sore throat and trouble swallowing.    Eyes: Negative for visual disturbance.   Respiratory: Positive for shortness of breath. Negative for cough.    Cardiovascular: Positive for chest pain. Negative for palpitations and leg swelling.   Gastrointestinal: Negative for abdominal pain, blood in stool, constipation, diarrhea, nausea and vomiting.   Genitourinary: Negative for dysuria and flank pain.   Musculoskeletal: Negative for arthralgias, back pain, myalgias and neck pain.   Skin: Negative for color change, rash and wound.   Neurological: Negative for dizziness,  weakness, light-headedness, numbness and headaches.   Psychiatric/Behavioral: Negative for confusion.   All other systems reviewed and are negative.    Negativein 10 essential Systems except as mentioned above and in the HPI.        PAST MEDICAL HISTORY     Past Medical History:   Diagnosis Date   ??? Anemia     H/O iron transfusions in first preg    ??? Asthma    ??? Bipolar 1 disorder (HCC)    ??? Borderline personality disorder (HCC)    ??? Depression    ??? History of blood transfusion     during her first preg    ??? Insomnia    ??? PTSD (post-traumatic stress disorder)    ??? Schizophrenia (HCC)    ??? Sciatica 2017   ??? 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 Vertigo 07/2017         SURGICAL HISTORY      has a past surgical history that includes Bunionectomy and Wrist surgery.      CURRENT MEDICATIONS       Previous Medications    ASPIRIN EC 81 MG EC TABLET    Take 1 tablet by mouth daily    CLINDAMYCIN (CLEOCIN) 150 MG CAPSULE    take 1 capsule by mouth three times a day until finished  DOCUSATE SODIUM (COLACE) 100 MG CAPSULE    Take 100 mg by mouth 2 times daily as needed for Constipation    DOXYLAMINE SUCCINATE (UNISOM) 25 MG TABLET    Take 1 tablet by mouth nightly    FERROUS SULFATE (FE TABS) 325 (65 FE) MG EC TABLET    Take 1 tablet by mouth 2 times daily    PRENATAL MULTIVIT-MIN-FE-FA (PRENATAL VITAMINS) 0.8 MG TABS    Take 27.8 mg by mouth daily    PRENATAL MV & MIN W/FA-DHA (PRENATAL ADULT GUMMY/DHA/FA) 0.4-25 MG CHEW    Take 1 tablet by mouth daily    VITAMIN B-6 (PYRIDOXINE) 50 MG TABLET    Take 1 tablet by mouth daily       ALLERGIES     is allergic to rocephin [ceftriaxone]; penicillins; lidocaine viscous hcl; and zithromax [azithromycin].    FAMILY HISTORY     She indicated that her mother is alive. She indicated that her father is alive. She indicated that her sister is alive. She indicated that her brother is alive. She indicated that her maternal grandmother is alive. She indicated that her maternal  grandfather is alive. She indicated that her paternal grandmother is deceased. She indicated that her paternal grandfather is alive.     family history includes Crohn's Disease in her father; Diabetes in her father, maternal grandmother, mother, and paternal grandmother; Hypertension in her father and mother; Lupus in her sister; Other in her brother, father, and maternal grandmother.    SOCIAL HISTORY      reports that she has quit smoking. She has never used smokeless tobacco. She reports previous alcohol use. She reports previous drug use. Drug: Marijuana.    PHYSICAL EXAM     INITIAL VITALS:  height is  (1.575 m) and weight is 187 lb (84.8 kg). Her oral temperature is 98.4 ??F (36.9 ??C). Her blood pressure is 128/69 and her pulse is 119. Her respiration is 14 and oxygen saturation is 100%.     Physical Exam  Vitals signs and nursing note reviewed.   Constitutional:       General: She is not in acute distress.  HENT:      Head: Normocephalic and atraumatic.   Eyes:      Conjunctiva/sclera: Conjunctivae normal.      Pupils: Pupils are equal, round, and reactive to light.   Neck:      Musculoskeletal: Neck supple.   Cardiovascular:      Rate and Rhythm: Regular rhythm. Tachycardia present.      Heart sounds: Normal heart sounds. No murmur.   Pulmonary:      Effort: Pulmonary effort is normal. No respiratory distress.      Breath sounds: Normal breath sounds.   Abdominal:      General: Bowel sounds are normal. There is distension (Consistent with pregnancy).      Palpations: Abdomen is soft.      Tenderness: There is no abdominal tenderness.   Musculoskeletal:         General: No tenderness.      Right lower leg: No edema.      Left lower leg: No edema.   Lymphadenopathy:      Cervical: No cervical adenopathy.   Skin:     General: Skin is warm and dry.      Findings: No rash.   Neurological:      Mental Status: She is alert and oriented to person, place, and time.   Psychiatric:  Judgment: Judgment  normal.           DIFFERENTIAL DIAGNOSIS/MDM:   23 year old female in third trimester pregnancy sent in by OB/GYN due to concerns about anemia.  She is having some intermittent chest pain and shortness of breath over the past several days.  She is afebrile, nontoxic, normal vital signs other than mild tachycardia.  Not in any respiratory distress.  She is not tachypneic.  Her oxygen saturations 100% on room air.  She has no signs of DVT.  I have a lower suspicion for pulmonary embolism however she has had recent travel.    Lower suspicion for ACS.  She does not have a cardiac history.  We will get cardiac work-up.  Will get CBC to assess for anemia.  Will have OB/GYN assess and see the patient however she is not having any signs of early labor.  She has no GI complaints and no abdominal complaints whatsoever.    DIAGNOSTIC RESULTS     EKG: All EKG's are interpreted by the Emergency Department Physician who either signs or Co-signs this chart in the absence of a cardiologist.    EKG Interpretation    Interpreted by me    Rhythm: normal sinus   Rate: normal  Axis: normal  Ectopy: none  Conduction: normal  ST Segments: no acute change  T Waves: no acute change  Q Waves: none    Clinical Impression: no acute changes and normal EKG    RADIOLOGY:   I directly visualized the following  images and reviewed the radiologist interpretations:  XR CHEST PORTABLE   Final Result   No evidence of acute cardiopulmonary disease         CT Chest Pulmonary Embolism W Contrast    (Results Pending)           ED BEDSIDE ULTRASOUND:      LABS:  Labs Reviewed   CBC WITH AUTO DIFFERENTIAL - Abnormal; Notable for the following components:       Result Value    Hemoglobin 10.6 (*)     Hematocrit 34.5 (*)     MCV 78.9 (*)     MCH 24.2 (*)     MCHC 30.7 (*)     RDW 28.3 (*)     All other components within normal limits   COMPREHENSIVE METABOLIC PANEL   BRAIN NATRIURETIC PEPTIDE   TROPONIN   TROPONIN   TYPE AND SCREEN         EMERGENCY DEPARTMENT  COURSE:   Vitals:    Vitals:    07/15/18 1853   BP: 128/69   Pulse: 119   Resp: 14   Temp: 98.4 ??F (36.9 ??C)   TempSrc: Oral   SpO2: 100%   Weight: 187 lb (84.8 kg)   Height: 5\' 2"  (1.575 m)     9:01 PM EDT  Patient's hemoglobin is normal.  It is over 10.  I reevaluated the patient again.  She still having the intermittent chest pain and difficulty breathing.  She has no pain in her legs are swelling or signs of DVT however I cannot rule out pulmonary embolism.  I talked with patient at length about risk versus benefits of a CT scan to rule out PE.  With her recent travel to [place] and back to [place] I am concerned for PE.  We will get CT scan of her chest to further evaluate.  I also spoke with OB/GYN resident who will see the patient in  the emergency department.    Disposition will be pending CT chest, the rest of her cardiac work-up and OB/GYN evaluation.         CRITICALCARE:      CONSULTS:  None      PROCEDURES:      FINAL IMPRESSION      1. Chest pain, unspecified type            DISPOSITION/PLAN   DISPOSITION      Pending CT chest, cardiac work-up, OB/GYN evaluation    PATIENT REFERRED TO:  No follow-up provider specified.    DISCHARGE MEDICATIONS:  New Prescriptions    No medications on file       (Please note that portions ofthis note were completed with a voice recognition program.  Efforts were made to edit the dictations but occasionally words are mis-transcribed.)    Alyce Pagan, DO  Attending Emergency Physician          Alyce Pagan, DO  07/15/18 2102

## 2018-07-15 NOTE — Telephone Encounter (Signed)
Patient states, "I've been getting short of breath and chest pains with movement.  I get like this when my hemoglobin is low. I was getting them before moving back here. I missed my infusion before coming back. I'm [redacted] weeks pregnant."    Reason for Disposition  ??? SEVERE chest pain    Answer Assessment - Initial Assessment Questions  1. LOCATION: "Where does it hurt?"        "All over"    2. RADIATION: "Does the pain go anywhere else?" (e.g., into neck, jaw, arms, back)      "My upper back. I've been having occasional contractions, I'm [redacted] weeks pregnant."    3. ONSET: "When did the chest pain begin?" (Minutes, hours or days)       "  4. PATTERN "Does the pain come and go, or has it been constant since it started?"  "Does it get worse with exertion?"       2 days    5. DURATION: "How long does it last" (e.g., seconds, minutes, hours)      "Constant"    6. SEVERITY: "How bad is the pain?"  (e.g., Scale 1-10; mild, moderate, or severe)     - MILD (1-3): doesn't interfere with normal activities      - MODERATE (4-7): interferes with normal activities or awakens from sleep     - SEVERE (8-10): excruciating pain, unable to do any normal activities        "8/10"    7. CARDIAC RISK FACTORS: "Do you have any history of heart problems or risk factors for heart disease?" (e.g., prior heart attack, angina; high blood pressure, diabetes, being overweight, high cholesterol, smoking, or strong family history of heart disease)      "No. My doctor has told me I was overweight but they said it wasn't a big problem because I'm not that big. It's because I'm short"    8. PULMONARY RISK FACTORS: "Do you have any history of lung disease?"  (e.g., blood clots in lung, asthma, emphysema, birth control pills)  "Asthma"    9. CAUSE: "What do you think is causing the chest pain?"      "I think it's because I need a transfusion. This happened the last time when I was low. They wanted me to get at least 3 before I deliver. I have anemia and they  are watching me for preeclampsia."    10. OTHER SYMPTOMS: "Do you have any other symptoms?" (e.g., dizziness, nausea, vomiting, sweating, fever, difficulty breathing, cough)        "Difficulty breathing and random dizzy spells."    11. PREGNANCY: "Is there any chance you are pregnant?" "When was your last menstrual period?"        "[redacted] weeks pregnant"    Protocols used: CHEST PAIN-ADULT-[place]      Discussed recommendations and care advice with patient. She verbalizes her understanding.

## 2018-07-15 NOTE — ED Notes (Signed)
Mode of arrival: Walk        Chief complaints: chest pain and SOB        Scenario: Pt arrives to facility with c/o chest pain and SOB for past few days. Pt states symptoms are intermittent. Pt is [redacted] weeks pregnant and states she is having contractions. Pt denies any vaginal bleeding at this time. Pt is in no distress.        C= "Have you ever felt that you should Cut down on your drinking?"  No  A= "Have people Annoyed you by criticizing your drinking?"  No  G= "Have you ever felt bad or Guilty about your drinking?"  No  E= "Have you ever had a drink as an Eye-opener first thing in the morning to steady your nerves or to help a hangover?"  No      Deferred [x]       Reason for deferring: N/A    *If yes to two or more: probable alcohol abuse.Daria Pastures, RN  07/15/18 2220
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
## 2018-07-15 NOTE — Discharge Instructions (Addendum)
---   Go directly to OB  ---  Current Diagnosis - Chest pain  ---  Results - All testing was unremarkable. Your evaluation was currently not consistent with a blood clot in the lungs, a tear in a major blood vessel, collapsed lung, heart failure, or heart attack. Given your risk factors and testing, your Heart Score is less than 4 which places you at less than a 1.8% risk of Major Adverse Cardiac Event in the next 6 weeks.  ---  Treatment - Currently the American Heart Association recommends that all patients who present complaining of chest pain should receive a stress test within 48-72 hours. Avoid strenuous activity while still having pain and until you see your doctor.   ---  Return to ED for - Worsening or continuing pain, shortness of breath, passing out, lightheadedness, dizzy, neck pain, sweating, fever, cough  ---  Follow up - With your primary care physician within 48-72 hours if no better or worse and for a stress test within 48-72 hours as directed by the [facility]  ---  ---  You have been seen, evaluated, treated, and reevaluated in the emergency department.  Your test results and diagnoses have been explained to you . You have expressed understanding of the results, your diagnosis, and its implications. All of your questions have been answered.  Please read your discharge instructions carefully and follow them as directed. You have been provided access to your imaging and lab results and had any abnormalities explained, please take them to your doctor as they may require follow up as we discussed.    ---  It is important to remember that your care does not end here and you must continue to monitor your condition closely.  Please return to the emergency department for any worsening or concerning signs or symptoms as directed by our conversations and the discharge instructions.  Otherwise please follow up with your doctor in 2 days if no better or worse.  If you do not have a doctor please contact the hospital  of your choice.  Please contact any physician specialists provided in your discharge notes as it is very important to follow up with them regarding your condition.  If you are unable to reach the physicians provided, please come back to the Emergency Department at any time.   ---  As always, please take medications as directed.  If you have any questions at all regarding your medications, please contact the pharmacist, the emergency department, or your doctor. Before taking any medication prescribed in the Emergency Department, please review the medication side effects and drug interactions ([email or web address]-[email or web address])  as they may interact with your home medications.   ---  Having trouble affording medications? Try [email or web address]! (This is not a hospital endorsed website, merely a recommendation based on my own personal experiences with [employer])  ---  Questions? Contact me anytime.  Farris Has MD, MBA  [phone]  ---  ---

## 2018-07-15 NOTE — ED Notes (Signed)
Labor and delivery nurse deb notified of pt arrival to their department.     Daria Pastures, RN  07/15/18 2239

## 2018-07-15 NOTE — Progress Notes (Signed)
Pt arrived and placed on moniters denies any c/o at present other than headache which she relates she takes fiorecet for and contractions. Not appearing in any distress.

## 2018-07-16 LAB — COMPREHENSIVE METABOLIC PANEL
ALT: 12 U/L (ref 5–33)
AST: 15 U/L (ref <32)
Albumin: 3.4 g/dL — ABNORMAL LOW (ref 3.5–5.2)
Alkaline Phosphatase: 144 U/L — ABNORMAL HIGH (ref 35–104)
Anion Gap: 14 mmol/L (ref 9–17)
BUN: 7 mg/dL (ref 6–20)
CO2: 20 mmol/L (ref 20–31)
Calcium: 9.4 mg/dL (ref 8.6–10.4)
Chloride: 103 mmol/L (ref 98–107)
Creatinine: 0.7 mg/dL (ref 0.50–0.90)
GFR African American: >60 mL/min (ref >60)
GFR Non-African American: >60 mL/min (ref >60)
Glucose: 88 mg/dL (ref 70–99)
Potassium: 4 mmol/L (ref 3.7–5.3)
Sodium: 137 mmol/L (ref 135–144)
Total Bilirubin: <0.15 mg/dL — ABNORMAL LOW (ref 0.3–1.2)
Total Protein: 7.1 g/dL (ref 6.4–8.3)

## 2018-07-16 LAB — CBC WITH AUTO DIFFERENTIAL
Absolute Eos #: 0.07 10*3/uL (ref 0.0–0.4)
Absolute Lymph #: 1.94 10*3/uL (ref 1.0–4.8)
Absolute Mono #: 0.72 10*3/uL (ref 0.1–1.3)
Basophils Absolute: 0.07 10*3/uL (ref 0.0–0.2)
Basophils: 1 % (ref 0–2)
Eosinophils %: 1 % (ref 0–4)
Hematocrit: 34.5 % — ABNORMAL LOW (ref 36–46)
Hemoglobin: 10.6 g/dL — ABNORMAL LOW (ref 12.0–16.0)
Lymphocytes: 27 % (ref 24–44)
MCH: 24.2 pg — ABNORMAL LOW (ref 26–34)
MCHC: 30.7 g/dL — ABNORMAL LOW (ref 31–37)
MCV: 78.9 fL — ABNORMAL LOW (ref 80–100)
MPV: 9.3 fL (ref 6.0–12.0)
Monocytes: 10 % — ABNORMAL HIGH (ref 1–7)
Platelets: 206 10*3/uL (ref 150–450)
RBC: 4.37 m/uL (ref 4.0–5.2)
RDW: 28.3 % — ABNORMAL HIGH (ref 11.5–14.9)
Seg Neutrophils: 61 % (ref 36–66)
Segs Absolute: 4.4 10*3/uL (ref 1.3–9.1)
WBC: 7.2 10*3/uL (ref 3.5–11.0)

## 2018-07-16 LAB — URINALYSIS WITH REFLEX TO CULTURE
Bilirubin Urine: NEGATIVE
Glucose, Ur: NEGATIVE
Leukocyte Esterase, Urine: NEGATIVE
Nitrite, Urine: NEGATIVE
Specific Gravity, UA: 1.029 (ref 1.000–1.030)
Urine Hgb: NEGATIVE
Urobilinogen, Urine: NORMAL
pH, UA: 6.5 (ref 5.0–8.0)

## 2018-07-16 LAB — MICROSCOPIC URINALYSIS
Epithelial Cells UA: 5 /HPF
RBC, UA: 2 /HPF
WBC, UA: 10 /HPF

## 2018-07-16 LAB — CULTURE, URINE: Culture: NO GROWTH

## 2018-07-16 LAB — TYPE AND SCREEN
ABO/Rh: O POS
Antibody Screen: NEGATIVE

## 2018-07-16 LAB — PROTEIN / CREATININE RATIO, URINE
Creatinine, Ur: 265.4 mg/dL — ABNORMAL HIGH (ref 28.0–217.0)
Total Protein, Urine: 26 mg/dL
Urine Total Protein Creatinine Ratio: 0.1 (ref 0.00–0.20)

## 2018-07-16 LAB — TROPONIN: Troponin, High Sensitivity: <6 ng/L (ref 0–14)

## 2018-07-16 LAB — EKG 12-LEAD
Atrial Rate: 89 {beats}/min
P Axis: 1 degrees
P-R Interval: 136 ms
Q-T Interval: 346 ms
QRS Duration: 74 ms
QTc Calculation (Bazett): 420 ms
R Axis: 65 degrees
T Axis: 29 degrees
Ventricular Rate: 89 {beats}/min

## 2018-07-16 LAB — BRAIN NATRIURETIC PEPTIDE: Pro-BNP: 46 pg/mL (ref <300)

## 2018-07-16 MED ORDER — LACTATED RINGERS IV SOLN
INTRAVENOUS | Status: DC
Start: 2018-07-16 — End: 2018-07-16
  Administered 2018-07-16: 04:00:00 via INTRAVENOUS

## 2018-07-16 MED ORDER — SODIUM CHLORIDE 0.9 % IV BOLUS
0.9 % | Freq: Once | INTRAVENOUS | Status: AC
Start: 2018-07-16 — End: 2018-07-15
  Administered 2018-07-16: 02:00:00 80 mL via INTRAVENOUS

## 2018-07-16 MED ORDER — PROMETHAZINE HCL 12.5 MG PO TABS
12.5 MG | Freq: Four times a day (QID) | ORAL | Status: DC | PRN
Start: 2018-07-16 — End: 2018-07-16

## 2018-07-16 MED ORDER — NITROFURANTOIN MONOHYD MACRO 100 MG PO CAPS
100 MG | ORAL_CAPSULE | Freq: Two times a day (BID) | ORAL | 0 refills | Status: DC
Start: 2018-07-16 — End: 2018-07-19

## 2018-07-16 MED ORDER — IOVERSOL 74 % IJ SOLN
74 % | Freq: Once | INTRAMUSCULAR | Status: AC | PRN
Start: 2018-07-16 — End: 2018-07-15
  Administered 2018-07-16: 02:00:00 75 mL via INTRAVENOUS

## 2018-07-16 MED ORDER — ACETAMINOPHEN 325 MG PO TABS
325 MG | ORAL | Status: DC | PRN
Start: 2018-07-16 — End: 2018-07-16
  Administered 2018-07-16: 04:00:00 650 mg via ORAL

## 2018-07-16 MED ORDER — NORMAL SALINE FLUSH 0.9 % IV SOLN
0.9 % | Freq: Once | INTRAVENOUS | Status: AC
Start: 2018-07-16 — End: 2018-07-15
  Administered 2018-07-16: 02:00:00 10 mL via INTRAVENOUS

## 2018-07-16 MED ORDER — ONDANSETRON HCL 4 MG/2ML IJ SOLN
4 MG/2ML | Freq: Four times a day (QID) | INTRAMUSCULAR | Status: DC | PRN
Start: 2018-07-16 — End: 2018-07-16
  Administered 2018-07-16: 04:00:00 4 mg via INTRAVENOUS

## 2018-07-16 MED ORDER — CALCIUM CARBONATE ANTACID 500 MG PO CHEW
500 MG | Freq: Three times a day (TID) | ORAL | Status: DC | PRN
Start: 2018-07-16 — End: 2018-07-16
  Administered 2018-07-16: 04:00:00 500 mg via ORAL

## 2018-07-16 MED ORDER — FAMOTIDINE 20 MG PO TABS
20 MG | Freq: Two times a day (BID) | ORAL | Status: DC | PRN
Start: 2018-07-16 — End: 2018-07-16

## 2018-07-16 MED FILL — PROMETHAZINE HCL 12.5 MG PO TABS: 12.5 MG | ORAL | Qty: 1

## 2018-07-16 MED FILL — FAMOTIDINE 20 MG PO TABS: 20 MG | ORAL | Qty: 1

## 2018-07-16 MED FILL — ONDANSETRON HCL 4 MG/2ML IJ SOLN: 4 MG/2ML | INTRAMUSCULAR | Qty: 2

## 2018-07-16 MED FILL — CALCIUM ANTACID 500 MG PO CHEW: 500 MG | ORAL | Qty: 1

## 2018-07-16 MED FILL — TYLENOL 325 MG PO TABS: 325 MG | ORAL | Qty: 2

## 2018-07-16 NOTE — Discharge Instructions (Signed)
Deberah Pelton Contractions: Care Instructions  Your Care Instructions    Braxton Hicks contractions prepare your uterus for labor. Think of them as a "warm-up" exercise that your body does. You may begin to feel them between the 28th and 30th weeks of your pregnancy. But they start as early as the 20th week.  Braxton Hicks contractions usually occur more often during the ninth month. They may go away when you are active and return when you rest. These contractions are like mild contractions of true labor, but they occur less often. (You feel fewer than 8 in an hour.) They don't cause your cervix to open.  It may be hard for you to tell the difference between [facility] contractions and true labor, especially in your first pregnancy.  Follow-up care is a key part of your treatment and safety. Be sure to make and go to all appointments, and call your doctor if you are having problems. It's also a good idea to know your test results and keep a list of the medicines you take.  How can you care for yourself at home?   Try a warm bath to help relieve muscle tension and reduce pain.   Change positions every 30 minutes. Take breaks if you must sit for a long time. Get up and walk around.   Drink plenty of water, enough so that your urine is light yellow or clear like water.   Taking short walks may help you feel better.  Your doctor needs to check any contractions that are getting stronger or closer together.

## 2018-07-16 NOTE — Progress Notes (Signed)
OBGYN  Progress Note    Brenda Zamora is a 23 y.o. female [phone] at 1422w4d  The patient was seen and examined. Her pain is well controlled. Denies any chest pain, SOB or pelvic pressure. She reports fetal movement is present, denies contractions, denies loss of fluid, denies vaginal bleeding.  Pt has been sleeping soundly and has had no complaints.     Vital Signs:  Vitals:    07/15/18 2249   BP: 131/71   Pulse: 98   Resp: 16   Temp: 98 ??F (36.7 ??C)   TempSrc: Oral         FHT: 125, moderate variability, accelerations present, decelerations absent  Contractions: rare  Cervical Exam: closed, 30% effaced and -2 fetal station     Assessment/Plan:  Brenda HazardKori Zamora is a 23 y.o. female [id number] at 9822w4d IUP    - GBS unknown / Rh positive / R immune              - Pen G for GBS prophylaxis if delivery is imminent               - GBS swab collected in ED and pending               - PCN allergy- sensitivities ordered  ??  Contractions               - VSS              - cEFM and TOCO, contractions have spaced out, patient is sleeping soundly                - SVE: closed/30%/-2 fetal station, patient's cervix unchanged from previous check               - IVF: LR@125  mL/hr              - UA suspicious for UTI with trace ketones, 10-20 WBC and few bacteria. Will print Macrobid 100 mg for patient to take until urine culture results               - Patient's cervix remains closed and her contractions have spaced out. Will allow patient to continue to sleep and discharge her home in the morning    ??  Headache (resolved)              - PreE labs wnl and P/C 0.11   - Headache has resolved s/p tylenol, IVF and food   ??  Chest Pain and Shortness of Breath (resolved)              - Continues to deny chest pain or SOB               - Please see consult note for ED admission               - CBC showed a Hgb of 10.6  ????????????????????????- CMP unremarkable  ????????????????????????- BNP 46   ????????????????????????- Troponin negative              - CXR 5/1: no acute cardiopulm  disease              - CT Chest 5/1: negative PE              - EKG 5/1: NSR              - Pepcid and TUMS PRN for heart burn   ??  Elevated BP x1               -  125/91              - PreE labs wnl and P/C 0.11              - Pt does not meet criteria for gHTN at this time   ??      Dr Jean Rosenthal updated and in agreement with plan    Waylan Rocher, DO  OBGYN Resident  07/16/2018, 2:37 AM

## 2018-07-16 NOTE — Progress Notes (Signed)
OB/GYN Resident Interval Note    Patient slept through out the night with no further complaints.  States this morning she no longer feels contractions. Denies any SOB, chest pain or headaches.     Rx Macrobid printed for patient. Told patient to check MyChart and discontinue Abx if her culture is negative.     She is ok to be discharged home now. Her next prenatal appointment is 5/5.     Vitals:    07/15/18 2249   BP: 131/71   Pulse: 98   Resp: 16   Temp: 98 ??F (36.7 ??C)   TempSrc: Oral         Voncille Lo, DO   OB/GYN Resident   Pager [phone]. [facility], [place]  07/16/2018, 6:28 AM

## 2018-07-18 LAB — CULTURE, STREP B SCREEN, VAGINAL/RECTAL: Culture: NEGATIVE

## 2018-07-19 ENCOUNTER — Ambulatory Visit
Admit: 2018-07-19 | Discharge: 2018-07-19 | Payer: Medicaid Other | Attending: Student in an Organized Health Care Education/Training Program | Primary: Family Medicine

## 2018-07-19 DIAGNOSIS — O134 Gestational [pregnancy-induced] hypertension without significant proteinuria, complicating childbirth: Secondary | ICD-10-CM

## 2018-07-19 DIAGNOSIS — O0993 Supervision of high risk pregnancy, unspecified, third trimester: Secondary | ICD-10-CM
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
## 2018-07-19 NOTE — Progress Notes (Signed)
I have discussed the care of the patient including pertinent history and examination findings with the resident. I agree with the assessment, and and orders as documented  by the resident.  GE Modifier:  This service has been performed by a resident physician under the direction of a teaching physician.

## 2018-07-20 NOTE — Telephone Encounter (Signed)
[facility] received about patient falling and hitting her head in the bathroom. She is [redacted]w[redacted]d and admits to being in the bathroom, feeling dizzy, and then falling and hitting her head. She denies any abdominal trauma or loss of consciousness and is feeling fetal movement. She admits to a headache. I urged the patient to come to the ED for evaluation immediately. The patient says she has someone who can bring her. I recommend calling 911 if the patient cannot come in for evaluation. The patient is agreeable to the above plan.    Alver Fisher, DO  Ob/Gyn Resident, PGY1  [facility]  [place], [place]  07/21/2018 12:02 AM

## 2018-07-21 ENCOUNTER — Inpatient Hospital Stay
Admission: EM | Admit: 2018-07-21 | Discharge: 2018-07-23 | Disposition: A | Discharge status: Home / Self Care | Payer: Medicaid Other | Source: Other Acute Inpatient Hospital | Admitting: Obstetrics & Gynecology

## 2018-07-21 ENCOUNTER — Emergency Department: Admit: 2018-07-21 | Payer: Medicaid Other | Primary: Family Medicine

## 2018-07-21 LAB — COVID-19: SARS-CoV-2, Rapid: NOT DETECTED

## 2018-07-21 LAB — URINE DRUG SCREEN
Amphetamine Screen, Ur: NEGATIVE
Barbiturate Screen, Ur: NEGATIVE
Benzodiazepine Screen, Urine: NEGATIVE
Cannabinoid Scrn, Ur: NEGATIVE
Cocaine Metabolite, Urine: NEGATIVE
Methadone Screen, Urine: NEGATIVE
Opiates, Urine: NEGATIVE
Oxycodone Screen, Ur: NEGATIVE
Phencyclidine, Urine: NEGATIVE

## 2018-07-21 LAB — CBC WITH AUTO DIFFERENTIAL
Absolute Eos #: 0.08 10*3/uL (ref 0.00–0.44)
Absolute Immature Granulocyte: 0 10*3/uL (ref 0.00–0.30)
Absolute Lymph #: 2.37 10*3/uL (ref 1.10–3.70)
Absolute Mono #: 0.55 10*3/uL (ref 0.10–1.20)
Basophils Absolute: 0 10*3/uL (ref 0.00–0.20)
Basophils: 0 % (ref 0–2)
Eosinophils %: 1 % (ref 1–4)
Hematocrit: 35.5 % — ABNORMAL LOW (ref 36.3–47.1)
Hemoglobin: 10.6 g/dL — ABNORMAL LOW (ref 11.9–15.1)
Immature Granulocytes: 0 %
Lymphocytes: 30 % (ref 24–43)
MCH: 24.7 pg — ABNORMAL LOW (ref 25.2–33.5)
MCHC: 29.9 g/dL (ref 28.4–34.8)
MCV: 82.8 fL (ref 82.6–102.9)
MPV: 11.6 fL (ref 8.1–13.5)
Monocytes: 7 % (ref 3–12)
NRBC Automated: 0 per 100 WBC
Platelets: 239 10*3/uL (ref 138–453)
RBC: 4.29 m/uL (ref 3.95–5.11)
RDW: 25.6 % — ABNORMAL HIGH (ref 11.8–14.4)
Seg Neutrophils: 62 % (ref 36–65)
Segs Absolute: 4.9 10*3/uL (ref 1.50–8.10)
WBC: 7.9 10*3/uL (ref 3.5–11.3)

## 2018-07-21 LAB — BASIC METABOLIC PANEL
Anion Gap: 14 mmol/L (ref 9–17)
BUN: 6 mg/dL (ref 6–20)
CO2: 18 mmol/L — ABNORMAL LOW (ref 20–31)
Calcium: 8.7 mg/dL (ref 8.6–10.4)
Chloride: 101 mmol/L (ref 98–107)
Creatinine: 0.64 mg/dL (ref 0.50–0.90)
GFR African American: >60 mL/min (ref >60)
GFR Non-African American: >60 mL/min (ref >60)
Glucose: 112 mg/dL — ABNORMAL HIGH (ref 70–99)
Potassium: 3.8 mmol/L (ref 3.7–5.3)
Sodium: 133 mmol/L — ABNORMAL LOW (ref 135–144)

## 2018-07-21 LAB — HEPATIC FUNCTION PANEL
ALT: 10 U/L (ref 5–33)
AST: 18 U/L (ref <32)
Albumin/Globulin Ratio: 1 (ref 1.0–2.5)
Albumin: 3.3 g/dL — ABNORMAL LOW (ref 3.5–5.2)
Alkaline Phosphatase: 149 U/L — ABNORMAL HIGH (ref 35–104)
Bilirubin, Direct: 0.1 mg/dL (ref <0.31)
Bilirubin, Indirect: 0.11 mg/dL (ref 0.00–1.00)
Total Bilirubin: 0.21 mg/dL — ABNORMAL LOW (ref 0.3–1.2)
Total Protein: 6.7 g/dL (ref 6.4–8.3)

## 2018-07-21 LAB — T. PALLIDUM AB: T. pallidum, IgG: NONREACTIVE

## 2018-07-21 LAB — TROPONIN
Troponin, High Sensitivity: <6 ng/L (ref 0–14)
Troponin, High Sensitivity: 13 ng/L (ref 0–14)

## 2018-07-21 LAB — URINALYSIS WITH MICROSCOPIC
Bilirubin Urine: NEGATIVE
Casts UA: 5 /LPF (ref 0–8)
Epithelial Cells UA: 5 /HPF (ref 0–5)
Glucose, Ur: NEGATIVE
Nitrite, Urine: NEGATIVE
Protein, UA: NEGATIVE
RBC, UA: 0 /HPF (ref 0–4)
Specific Gravity, UA: 1.015 (ref 1.005–1.030)
Urine Hgb: NEGATIVE
Urobilinogen, Urine: NORMAL
WBC, UA: 10 /HPF (ref 0–5)
pH, UA: 7.5 (ref 5.0–8.0)

## 2018-07-21 LAB — PROTEIN / CREATININE RATIO, URINE
Creatinine, Ur: 106.8 mg/dL (ref 28.0–217.0)
Total Protein, Urine: 14 mg/dL
Urine Total Protein Creatinine Ratio: 0.13 (ref 0.00–0.20)

## 2018-07-21 LAB — BRAIN NATRIURETIC PEPTIDE: Pro-BNP: 145 pg/mL (ref <300)

## 2018-07-21 LAB — T3, FREE: T3, Free: 2.36 pg/mL (ref 2.02–4.43)

## 2018-07-21 LAB — T4, FREE: Thyroxine, Free: 0.96 ng/dL (ref 0.93–1.70)

## 2018-07-21 LAB — PROTIME-INR
INR: 1
Protime: 10.3 s (ref 9.0–12.0)

## 2018-07-21 LAB — APTT: PTT: 25.2 s (ref 20.5–30.5)

## 2018-07-21 LAB — TSH: TSH: 3.33 mIU/L (ref 0.30–5.00)

## 2018-07-21 MED ORDER — ENOXAPARIN SODIUM 40 MG/0.4ML SC SOLN
40 | Freq: Every day | SUBCUTANEOUS | Status: DC
Start: 2018-07-21 — End: 2018-07-21

## 2018-07-21 MED ORDER — ACETAMINOPHEN 325 MG PO TABS
325 MG | ORAL | Status: DC | PRN
Start: 2018-07-21 — End: 2018-07-21

## 2018-07-21 MED ORDER — LACTATED RINGERS IV SOLN
INTRAVENOUS | Status: DC
Start: 2018-07-21 — End: 2018-07-22
  Administered 2018-07-21 – 2018-07-22 (×3): via INTRAVENOUS

## 2018-07-21 MED ORDER — MISOPROSTOL 25 MCG PRE-SPLIT TABLET
25 mcg | Freq: Once | ORAL | Status: AC
Start: 2018-07-21 — End: 2018-07-21
  Administered 2018-07-21: 21:00:00 25 ug via VAGINAL

## 2018-07-21 MED ORDER — OXYTOCIN 30 UNITS IN 500 ML INFUSION
30 UNIT/500ML | INTRAVENOUS | Status: DC | PRN
Start: 2018-07-21 — End: 2018-07-22

## 2018-07-21 MED ORDER — NORMAL SALINE FLUSH 0.9 % IV SOLN
0.9 | Freq: Two times a day (BID) | INTRAVENOUS | Status: DC
Start: 2018-07-21 — End: 2018-07-22

## 2018-07-21 MED ORDER — NORMAL SALINE FLUSH 0.9 % IV SOLN
0.9 % | Freq: Two times a day (BID) | INTRAVENOUS | Status: DC
Start: 2018-07-21 — End: 2018-07-22

## 2018-07-21 MED ORDER — ASPIRIN 81 MG PO TBEC
81 MG | Freq: Every day | ORAL | Status: DC
Start: 2018-07-21 — End: 2018-07-21

## 2018-07-21 MED ORDER — NORMAL SALINE FLUSH 0.9 % IV SOLN
0.9 % | INTRAVENOUS | Status: DC | PRN
Start: 2018-07-21 — End: 2018-07-22

## 2018-07-21 MED ORDER — CLINDAMYCIN HCL 150 MG PO CAPS
150 MG | Freq: Three times a day (TID) | ORAL | Status: DC
Start: 2018-07-21 — End: 2018-07-21

## 2018-07-21 MED ORDER — ONDANSETRON HCL 4 MG/2ML IJ SOLN
4 MG/2ML | Freq: Four times a day (QID) | INTRAMUSCULAR | Status: DC | PRN
Start: 2018-07-21 — End: 2018-07-22
  Administered 2018-07-22: 04:00:00 4 mg via INTRAVENOUS

## 2018-07-21 MED ORDER — ACETAMINOPHEN 325 MG PO TABS
325 MG | Freq: Once | ORAL | Status: AC
Start: 2018-07-21 — End: 2018-07-21
  Administered 2018-07-21: 18:00:00 650 mg via ORAL

## 2018-07-21 MED ORDER — MORPHINE SULFATE 4 MG/ML IJ SOLN
4 MG/ML | Freq: Once | INTRAMUSCULAR | Status: AC
Start: 2018-07-21 — End: 2018-07-21
  Administered 2018-07-21: 18:00:00 2 mg via INTRAVENOUS

## 2018-07-21 MED ORDER — ACETAMINOPHEN 500 MG PO TABS
500 MG | Freq: Four times a day (QID) | ORAL | Status: DC | PRN
Start: 2018-07-21 — End: 2018-07-22

## 2018-07-21 MED ORDER — NORMAL SALINE FLUSH 0.9 % IV SOLN
0.9 | INTRAVENOUS | Status: DC | PRN
Start: 2018-07-21 — End: 2018-07-22

## 2018-07-21 MED ORDER — IOHEXOL 350 MG/ML IV SOLN
350 MG/ML | Freq: Once | INTRAVENOUS | Status: AC | PRN
Start: 2018-07-21 — End: 2018-07-21
  Administered 2018-07-21: 18:00:00 75 mL via INTRAVENOUS

## 2018-07-21 MED ORDER — DOCUSATE SODIUM 100 MG PO CAPS
100 MG | Freq: Two times a day (BID) | ORAL | Status: DC | PRN
Start: 2018-07-21 — End: 2018-07-22

## 2018-07-21 MED ORDER — DIPHENHYDRAMINE HCL 50 MG/ML IJ SOLN
50 MG/ML | Freq: Once | INTRAMUSCULAR | Status: AC
Start: 2018-07-21 — End: 2018-07-21
  Administered 2018-07-21: 18:00:00 25 mg via INTRAVENOUS

## 2018-07-21 MED FILL — ENOXAPARIN SODIUM 40 MG/0.4ML SC SOLN: 40 MG/0.4ML | SUBCUTANEOUS | Qty: 0.4

## 2018-07-21 MED FILL — ACETAMINOPHEN 325 MG PO TABS: 325 MG | ORAL | Qty: 2

## 2018-07-21 MED FILL — MISOPROSTOL 25 MCG PRE-SPLIT TABLET: 25 mcg | ORAL | Qty: 1

## 2018-07-21 MED FILL — ASPIRIN EC 81 MG PO TBEC: 81 MG | ORAL | Qty: 1

## 2018-07-21 MED FILL — CLINDAMYCIN HCL 150 MG PO CAPS: 150 MG | ORAL | Qty: 2

## 2018-07-21 MED FILL — MORPHINE SULFATE 4 MG/ML IJ SOLN: 4 mg/mL | INTRAMUSCULAR | Qty: 1

## 2018-07-21 MED FILL — DIPHENHYDRAMINE HCL 50 MG/ML IJ SOLN: 50 MG/ML | INTRAMUSCULAR | Qty: 1

## 2018-07-21 NOTE — ED Notes (Addendum)
Araceli Bouche, RN  07/21/18 1237       Araceli Bouche, RN  07/21/18 1240

## 2018-07-21 NOTE — ED Notes (Signed)
Second troponin collected labeled and sent to lab.      Elmer Sow, RN  07/21/18 1429

## 2018-07-21 NOTE — ED Notes (Signed)
Labeled urine specimen sent to lab.      Elmer Sow, RN  07/21/18 [phone]

## 2018-07-21 NOTE — ED Notes (Signed)
OB/GYN at bedside.      Elmer Sow, RN  07/21/18 [phone]

## 2018-07-21 NOTE — ED Notes (Signed)
Pt to CT via stretcher.      Elmer Sow, RN  07/21/18 1320

## 2018-07-21 NOTE — Progress Notes (Signed)
Labor Progress Note    Brenda Zamora is a 23 y.o. female [phone] at [redacted]w[redacted]d  The patient was seen and examined. Her pain is well controlled. She reports fetal movement is present, complains of contractions, denies loss of fluid, denies vaginal bleeding.  Foley balloon inserted, inflated to 60cc, patient tolerated well.    Vital Signs:  Vitals:    07/21/18 1501 07/21/18 1538 07/21/18 1652 07/21/18 2000   BP: 109/81 120/64 119/87 (!) 125/57   Pulse: 100 98 98 108   Resp: 20 18 16     Temp:  98.1 ??F (36.7 ??C)  98.8 ??F (37.1 ??C)   TempSrc:  Oral  Oral   SpO2: 100%      Weight:       Height:            FHT: 130, moderate variability, accelerations present, no further decelerations  Contractions: irregular, every 1-5 minutes  Cervical Exam: 1 cm dilated, 50 effaced, -2 station  Pitocin: @ 0 mu/min    Membranes: Intact  Scalp Electrode in place: absent  Intrauterine Pressure Catheter in Place: absent    Interventions: Foley balloon inserted and inflated to 60cc, patient tolerated well.    Assessment/Plan:  Brenda Zamora is a 23 y.o. female [phone] at [redacted]w[redacted]d admitted for IOL 2/2 gHTN (newly diagnosed)   - GBS negative, No indication for GBS prophylaxis   - Rare elevated BP, no severe range pressures   - PreE labs wnl x1, P/C 0.13   - Patient admits to mild headache, denies other s/s preE   - cEFM/TOCO   - No further decelerations after spontaneous prolonged decel previously documented   - S/p Cytotec PV x1   - Foley balloon inserted, inflated to 60cc, patient tolerated well   - Will start low dose pitocin    S/p Fall 07/20/18   - Patient evaluated in the ED, headache had previously resolved however this is starting to return   - Patient declines Tylenol at this time   - Denies other s/s preE at this time   - CT Neck/Cervical Spine: no acute abnormalities    Maternal Tachycardia/Chest Pain/Dyspnea   - Currently borderline tachycardia   - Admits to improvement in symptoms   - BNP 145, troponin negative in ED   - CT Chest:  Negative for PE   - Thyroid studies wnl    Senior resident updated and in agreement with plan    Alver Fisher, DO  Ob/Gyn Resident  07/21/2018, 9:44 PM

## 2018-07-21 NOTE — Other (Signed)
Foley ballon placed pt tolerated procedure. 60cc inflated

## 2018-07-21 NOTE — ED Notes (Signed)
Labeled blood specimen sent to lab.      Elmer Sow, RN  07/21/18 [phone]

## 2018-07-21 NOTE — Progress Notes (Signed)
Obstetric/Gynecology Resident Interval Note    Patient noted to have a 3 minute prolonged deceleration at 1722 with nadir of 100. Spontaneous return to baseline with moderate variability and accelerations. Will continue to monitor closely.    Alver Fisher, DO  OB/GYN Resident, PGY1  [facility], [place]  07/21/2018, 7:40 PM

## 2018-07-21 NOTE — ED Provider Notes (Signed)
[facility] ED  Emergency Department Encounter  Emergency Medicine Resident     Pt Name: Brenda Zamora  MRN: [id number]  Birthdate 10-Mar-1996  Date of evaluation: 07/21/18  PCP:  Wilmer Floor, MD    Chief Complaint     Chief Complaint   Patient presents with   ??? Chest Pain     c/o "all over" worsening chest pain since yesterday    ??? Dizziness   ??? Headache     pt states she fell in the bathtub yesterday hitting the front of her head, uknown LOC     History of present illness (HPI)  (Location/Symptom, Timing/Onset, Context/Setting, Quality, Duration, Modifying Factors, Severity.)      Brenda Zamora is a 23 y.o. female who presented to the emergency department with CP, SOB, and Syncope.  The patient is profoundly tachycardic with a heart rate in the 140s and does appear to be short of breath.  She states she has been feeling ill the last few days and describes her chest pain as midsternal and like a heavy pressure.  She is [redacted] weeks pregnant with a known history of preeclampsia and is currently compliant with aspirin therapy.  Patient also reports that she became dizzy last night and had a syncopal episode in which she fell in the bathroom striking her head and neck on the bathtub.  The patient does not know whether that she lost conscious however she has no recollection of events surrounding the fall.    Past medical / surgical/ social/ family history      Past Medical Hx:    has a past medical history of Anemia, Asthma, Bipolar 1 disorder (HCC), Borderline personality disorder (HCC), Depression, History of blood transfusion, Insomnia, PTSD (post-traumatic stress disorder), Schizophrenia (HCC), Sciatica, Seizure in childhood (HCC), and Vertigo.    Past Surgical Hx:   has a past surgical history that includes Bunionectomy and Wrist surgery.    Social Hx:  Social History     Socioeconomic History   ??? Marital status: Single     Spouse name: Not on file   ??? Number of children: Not on file   ??? Years of  education: Not on file   ??? Highest education level: Not on file   Occupational History   ??? Not on file   Social Needs   ??? Financial resource strain: Not on file   ??? Food insecurity     Worry: Not on file     Inability: Not on file   ??? Transportation needs     Medical: Not on file     Non-medical: Not on file   Tobacco Use   ??? Smoking status: Former Smoker   ??? Smokeless tobacco: Never Used   ??? Tobacco comment: pt quit when she learned she was preg    Substance and Sexual Activity   ??? Alcohol use: Not Currently   ??? Drug use: Not Currently     Types: Marijuana     Comment: pt last used approx 11/2017    ??? Sexual activity: Yes     Partners: Male   Lifestyle   ??? Physical activity     Days per week: Not on file     Minutes per session: Not on file   ??? Stress: Not on file   Relationships   ??? Social Wellsite geologist on phone: Not on file     Gets together: Not on file  Attends religious service: Not on file     Active member of club or organization: Not on file     Attends meetings of clubs or organizations: Not on file     Relationship status: Not on file   ??? Intimate partner violence     Fear of current or ex partner: Not on file     Emotionally abused: Not on file     Physically abused: Not on file     Forced sexual activity: Not on file   Other Topics Concern   ??? Not on file   Social History Narrative   ??? Not on file     Family History   Problem Relation Age of Onset   ??? Diabetes Mother    ??? Hypertension Mother    ??? Diabetes Father    ??? Hypertension Father    ??? Crohn's Disease Father    ??? Other Father    ??? Other Brother         Pulmonary Hypertension in one brother   ??? Diabetes Maternal Grandmother    ??? Other Maternal Grandmother    ??? Diabetes Paternal Grandmother    ??? Lupus Sister         2 sisters with lupus   1 with HTN and diabetes     Allergies:    Rocephin [ceftriaxone]; Penicillins; Lidocaine viscous hcl; and Zithromax [azithromycin]    Home Medications:  Prior to Admission medications    Medication Sig  Start Date End Date Taking? Authorizing Provider   aspirin EC 81 MG EC tablet Take 1 tablet by mouth daily 02/08/18  Yes Alver Fisher, DO   clindamycin (CLEOCIN) 150 MG capsule take 1 capsule by mouth three times a day until finished 01/08/18  Yes Historical Provider, MD   docusate sodium (COLACE) 100 MG capsule Take 100 mg by mouth 2 times daily as needed for Constipation   Yes Historical Provider, MD     Review of systems  (2-9 systems for level 4, 10 or more for level 5)      CONSTITUTIONAL: Denies recent fever, chills  EYES: No visual changes.    NECK: No midline neck pain  RESPIRATORY: + shortness of breath. + dyspnea.    CARDIAC: +  chest pain. The pain does not radiate.  GI: Denies abdominal pain +  nausea, +  vomiting.  Denies Blood in the stool or black tarry stools.    GU: Denies dysuria  MUSCULOSKELETAL: Denies focal weakness.    NEUROLOGICAL: denies headache or focal weakness.    SKIN:  Denies any rash.      Physical exam  (up to 7 for level 4, 8 or more for level 5)      BP 112/79    Pulse 105    Temp 98.9 ??F (37.2 ??C) (Oral)    Resp 20    Ht  (1.575 m)    Wt 193 lb (87.5 kg)    LMP 11/02/2017 (Exact Date)    SpO2 99%    BMI 35.30 kg/m??     GENERAL APPEARANCE: AxOx4, generally well-appearing. no acute distress.   HEENT: Normocephalic and atraumatic. Mucus membranes moist. EOMI, clear conjunctiva, oropharynx clear.   NECK: Supple without lymphadenopathy. No stiffness or restricted ROM.   HEART: Normal rate and regular rhythm, normal S1/S1, no m/r/g   LUNGS: clear to auscultation without wheezes or rales, good air movement  ABDOMEN: Soft, nontender, nondistended with good bowel sounds heard.  BACK: No CVAT, no obvious deformity.   EXTREMITIES: Without cyanosis, clubbing or edema.   NEUROLOGICAL: Grossly nonfocal. Alert and oriented, moving all 4 extremities. CN not formally tested but appear grossly intact.   SKIN: Warm and dry without any rash.    Plan     DIAGNOSTIC ORDERS:  Orders Placed This  Encounter   Procedures   ??? CT Chest Pulmonary Embolism W Contrast   ??? CT HEAD WO CONTRAST   ??? CT Cervical Spine WO Contrast   ??? T3, Free   ??? T4, Free   ??? TSH without Reflex   ??? Urinalysis with Microscopic   ??? Troponin   ??? APTT   ??? Protime-INR   ??? CBC Auto Differential   ??? Basic Metabolic Panel   ??? Brain Natriuretic Peptide   ??? Hepatic Function Panel   ??? Troponin   ??? Notify physician if c-collar and/or backboard   ??? Inpatient consult to Obstetrics / Gynecology   ??? POC CHEMISTRY (NA,K,ICA,GLU,CALC HCT/HGB,LACTATE,CREA,CL)   ??? EKG 12 Lead   ??? PATIENT STATUS (FROM ED OR OR/PROCEDURAL) Observation       MEDICATION ORDERS:  Orders Placed This Encounter   Medications   ??? iohexol (OMNIPAQUE 350) solution 75 mL   ??? acetaminophen (TYLENOL) tablet 650 mg   ??? morphine injection 2 mg   ??? diphenhydrAMINE (BENADRYL) injection 25 mg       Differential diagnosis      Sinus Bradycardia DDX:  Acute hypoglycemia, hypothermia, hypothyroidism, myxedema coma, obstructive sleep apnea    Cardiac Arrest DDX:   Acute Coronary Thrombosis, Pulmonary Embolism,Toxin, Trauma, Tamponade, Tension Pneumothorax, Hypovolemia, Acidosis, Hyperkalemia, Hypothermia, Hypoxia, Hypoglycemia    Etiology for Cardiac dysrhythmia differential  Idiopathic, anemia, anxiety, dehydration, electrolyte abnormalities, fever or sepsis, hyperthyroidism, hypoglycemia, ischemia, metabolic disorders, pain, toxicologic exposure, PE, respiratory etiologies, shock, trauma, withdrawal syndromes    Chest Pain DDX:   Emergent: ACS/NSTEMI/STEMI/angina, arrhythmia, trauma, aortic dissection,  PE, PNA, pneumothroax, esophageal rupture, tamponade, Cocaine use, Coronary artery dissection  Nonemergent: pneumonia, pericarditis, GERD, MSK, Endocarditis, anxiety    Evaluated for: diaphoresis, present chest pain, tachypnea, BP both arms, heart sounds, JVD, tender chest wall, wheezing    Very low risk for spontaneous coronary artery dissection in women between 44 and 55 as the patient does  not have connective tissue disorders, inflammatory disorders, or recent pregnancy.     Hypertension DDX:   HTN evaluate (acute EOD -- brain, renal, thoracic aorta, lung, kidney, eyes)   (renal dx, vascular lesion, drugs (MAO inhibitor + tyramine, steroids, cocaine, amphetamines)    Lightheaded/ Dizzy DDX:   Vertigo: Peripheral (BPPV, labyrinthitis, Meniere's) Central: (MS, Acoustic neuroma, carotid artery dissection).  Lightheaded: Serious (cardiac valve/ arrhytmia, anemia, low glucose, infection), Common (orthostatic, nonspecific)     Shortness of Breath DDX:   sob/wheezing/cough evaluate for COPD exacerbation (home O2, PNA, hospitalization), asthma (past intubation, hospitalization, tobacco history), Pneumothorax, anaphylaxis, anxiety, PE (FH, OCP, tobacco use, BMI, immobilization, recent surgery, cancer, past DVT/ PE), pericardial effusion, CHF, ACS/MI, atelectasis, lower airway obstruction, aspiration, sudden onset, fever, cough, leg swelling.    Syncope/ Pre-syncope DDX:   cardiac arrhythmia/ valvular, low glucose, anemia, SAH, dissection, PE, AAA rupture, ectopic pregnancy rupture, seizure, vasovagal, orthostatic    Sinus Tachycardia DDX:  Fever, hypovolemia, hypotension with shock, sepsis, anemia, hypoxia, PE, ACS, AMI, pain, anxiety, pheochromocytoma, hyperthyroidism, decompensated heart failure, pulmonary disease, sympathomimetics, anticholinergics, illicit drug use, beta blocker withdrawal, alcohol withdrawl    Diagnostic Results     LABS:  Results for orders placed or performed during the hospital encounter of 07/21/18   T3, Free   Result Value Ref Range    T3, Free 2.36 2.02 - 4.43 pg/mL   T4, Free   Result Value Ref Range    Thyroxine, Free 0.96 0.93 - 1.70 ng/dL   TSH without Reflex   Result Value Ref Range    TSH 3.33 0.30 - 5.00 mIU/L   Urinalysis with Microscopic   Result Value Ref Range    Color, UA YELLOW YELLOW    Turbidity UA CLEAR CLEAR    Glucose, Ur NEGATIVE NEGATIVE    Bilirubin Urine  NEGATIVE NEGATIVE    Ketones, Urine TRACE (A) NEGATIVE    Specific Gravity, UA 1.015 1.005 - 1.030    Urine Hgb NEGATIVE NEGATIVE    pH, UA 7.5 5.0 - 8.0    Protein, UA NEGATIVE NEGATIVE    Urobilinogen, Urine Normal Normal    Nitrite, Urine NEGATIVE NEGATIVE    Leukocyte Esterase, Urine SMALL (A) NEGATIVE    -          WBC, UA 10 TO 20 0 - 5 /HPF    RBC, UA 0 TO 2 0 - 4 /HPF    Casts UA  0 - 8 /LPF     5 TO 10 HYALINE Reference range defined for non-centrifuged specimen.    Crystals, UA NOT REPORTED None /HPF    Epithelial Cells UA 5 TO 10 0 - 5 /HPF    Renal Epithelial, UA NOT REPORTED 0 /HPF    Bacteria, UA MODERATE (A) None    Mucus, UA NOT REPORTED None    Trichomonas, UA NOT REPORTED None    Amorphous, UA NOT REPORTED None    Other Observations UA NOT REPORTED NOT REQ.    Yeast, UA NOT REPORTED None   APTT   Result Value Ref Range    PTT 25.2 20.5 - 30.5 sec   Protime-INR   Result Value Ref Range    Protime 10.3 9.0 - 12.0 sec    INR 1.0    CBC Auto Differential   Result Value Ref Range    WBC 7.9 3.5 - 11.3 k/uL    RBC 4.29 3.95 - 5.11 m/uL    Hemoglobin 10.6 (L) 11.9 - 15.1 g/dL    Hematocrit 16.1 (L) 36.3 - 47.1 %    MCV 82.8 82.6 - 102.9 fL    MCH 24.7 (L) 25.2 - 33.5 pg    MCHC 29.9 28.4 - 34.8 g/dL    RDW 09.6 (H) 04.5 - 14.4 %    Platelets 239 138 - 453 k/uL    MPV 11.6 8.1 - 13.5 fL    NRBC Automated 0.0 0.0 per 100 WBC    Differential Type NOT REPORTED     WBC Morphology NOT REPORTED     RBC Morphology NOT REPORTED     Platelet Estimate NOT REPORTED     Immature Granulocytes 0 0 %    Seg Neutrophils 62 36 - 65 %    Lymphocytes 30 24 - 43 %    Monocytes 7 3 - 12 %    Eosinophils % 1 1 - 4 %    Basophils 0 0 - 2 %    Absolute Immature Granulocyte 0.00 0.00 - 0.30 k/uL    Segs Absolute 4.90 1.50 - 8.10 k/uL    Absolute Lymph # 2.37 1.10 - 3.70 k/uL    Absolute Mono # 0.55 0.10 -  1.20 k/uL    Absolute Eos # 0.08 0.00 - 0.44 k/uL    Basophils Absolute 0.00 0.00 - 0.20 k/uL    Morphology ANISOCYTOSIS  PRESENT     Morphology MICROCYTOSIS PRESENT    Basic Metabolic Panel   Result Value Ref Range    Glucose 112 (H) 70 - 99 mg/dL    BUN 6 6 - 20 mg/dL    CREATININE 0.450.64 4.090.50 - 0.90 mg/dL    Bun/Cre Ratio NOT REPORTED 9 - 20    Calcium 8.7 8.6 - 10.4 mg/dL    Sodium 811133 (L) 914135 - 144 mmol/L    Potassium 3.8 3.7 - 5.3 mmol/L    Chloride 101 98 - 107 mmol/L    CO2 18 (L) 20 - 31 mmol/L    Anion Gap 14 9 - 17 mmol/L    GFR Non-African American >60 >60 mL/min    GFR African American >60 >60 mL/min    GFR Comment          GFR Staging NOT REPORTED    Brain Natriuretic Peptide   Result Value Ref Range    Pro-BNP 145 <300 pg/mL    BNP Interpretation Pro-BNP Reference Range:    Hepatic Function Panel   Result Value Ref Range    Alb 3.3 (L) 3.5 - 5.2 g/dL    Alkaline Phosphatase 149 (H) 35 - 104 U/L    ALT 10 5 - 33 U/L    AST 18 <32 U/L    Total Bilirubin 0.21 (L) 0.3 - 1.2 mg/dL    Bilirubin, Direct 7.820.10 <0.31 mg/dL    Bilirubin, Indirect 0.11 0.00 - 1.00 mg/dL    Total Protein 6.7 6.4 - 8.3 g/dL    Globulin NOT REPORTED 1.5 - 3.8 g/dL    Albumin/Globulin Ratio 1.0 1.0 - 2.5   Troponin   Result Value Ref Range    Troponin, High Sensitivity <6 0 - 14 ng/L    Troponin T NOT REPORTED <0.03 ng/mL    Troponin Interp NOT REPORTED        RADIOLOGY:  CT HEAD WO CONTRAST   Preliminary Result   No evidence of acute intracranial abnormality.         CT Cervical Spine WO Contrast   Preliminary Result   No radiographic findings to suggest presence of acute fracture of the   cervical spine.         CT Chest Pulmonary Embolism W Contrast   Preliminary Result   No radiographic findings to suggest presence of pulmonary thromboembolism.           EKG Interpretation  Rate: tachycardia  Rhythm: sinus tachycardia  Axis: normal  Ectopy: none  Intervals: normal with good R-wave progression  Enlargements:  no enlargements are seen  ST Segments: no acute change  T Waves: no acute change  Q Waves: none    Clinical Impression: no acute changes,  non-specific EKG and sinus tachycardia    Isidor HoltsWill Rheba Diamond, MD  Emergency Medicine Resident    Medical decision making  (MDM) / ED Course     Emergency department chest pain evaluation was completed which included a 12-lead EKG, serial troponins, a chest x-ray, and laboratory studies.  The patient's chest pain is not concerning for pulmonary embolus, cardiac ischemia and aortic dissection, as evidenced by normal labs/imaging, the quality of the pain and ECG findings. Given the risks associated with this etiology,  further testing and evaluation is warranted so the patient will be  brought into the hospital under the OB/GYN service and Cardiology will be consulted.     The patient's chest CT was within normal limits making significant pneumothorax, pneumonia, lung abscess, or aortic pathology much less likely. The patient's EKG does not show changes consistent with pericarditis. The patient's symptomatology and physical exam are not completely consistent with myocarditis, costochondritis, pleurisy, aortic pathology but was very concerning for pulmonary embolus.  CT PE was obtained and did not reveal a embolus.  A CT of the head and neck were also obtained to rule out trauma and those were both normal.    OB was consulted and evaluated the patient at bedside.  A tocometer was applied and according to the OB resident appeared to have normal waveforms.  OB is admitting the patient and states they will have cardiology consulted.    Procedures     None    Final impression      1. Dizziness    2. Chest pain, unspecified type    3. Nonintractable headache, unspecified chronicity pattern, unspecified headache type    4. Shortness of breath         Disposition with plan     Disposition: Admitted    PATIENT REFERRED TO:  Wilmer Floor, MD  [address]  [place] [address]  [phone]          Wilmer Floor, MD  [address]  [place] [address]  [phone]            DISCHARGE MEDICATIONS:  New  Prescriptions    No medications on file         Isidor Holts, MD  Emergency Medicine Resident    (Please note that portions of this note were completed with a voice recognition program.  Efforts were made to edit the dictations but occasionally words are mis-transcribed.)       Saverio Danker, MD  Resident  07/21/18 [phone]

## 2018-07-21 NOTE — ED Notes (Signed)
Pt back to room from CT, NAD noted.  Call light within reach, will continue to monitor.      Elmer Sow, RN  07/21/18 1336

## 2018-07-21 NOTE — ED Notes (Signed)
Dr. Anson Fret at bedside evaluating patient.      Elmer Sow, RN  07/21/18 1159

## 2018-07-21 NOTE — ED Provider Notes (Signed)
[facility]     Emergency Department     Faculty Note/ Attestation      Pt Name: Brenda HazardKori Bickford                                       MRN: [id number]  Birthdate 04/17/1995  Date of evaluation: 07/21/2018    Patients PCP:    Wilmer FloorAnthony D Atkins, MD      Attestation  I performed a history and physical examination of the patient and discussed management with the resident. I reviewed the resident???s note and agree with the documented findings and plan of care. Any areas of disagreement are noted on the chart. I was personally present for the key portions of any procedures. I have documented in the chart those procedures where I was not present during the key portions. I have reviewed the emergency nurses triage note. I agree with the chief complaint, past medical history, past surgical history, allergies, medications, social and family history as documented unless otherwise noted below.    For Physician Assistant/ Nurse Practitioner cases/documentation I have personally evaluated this patient and have completed at least one if not all key elements of the E/M (history, physical exam, and MDM). Additional findings are as noted.      Initial Screens:        Glasgow Coma Scale  Eye Opening: Spontaneous  Best Verbal Response: Oriented  Best Motor Response: Obeys commands  Glasgow Coma Scale Score: 15    Vitals:    Vitals:    07/21/18 1431 07/21/18 1444 07/21/18 1445 07/21/18 1501   BP: 120/72  112/79 109/81   Pulse: 102 103 105 100   Resp: 19 21 20 20    Temp:       TempSrc:       SpO2: 100% 100% 99% 100%   Weight:       Height:           CHIEF COMPLAINT       Chief Complaint   Patient presents with   ??? Chest Pain     c/o "all over" worsening chest pain since yesterday    ??? Dizziness   ??? Headache     pt states she fell in the bathtub yesterday hitting the front of her head, uknown LOC             DIAGNOSTIC RESULTS             RADIOLOGY:   CT HEAD WO CONTRAST   Preliminary Result   No evidence of acute  intracranial abnormality.         CT Cervical Spine WO Contrast   Preliminary Result   No radiographic findings to suggest presence of acute fracture of the   cervical spine.         CT Chest Pulmonary Embolism W Contrast   Preliminary Result   No radiographic findings to suggest presence of pulmonary thromboembolism.               LABS:  Labs Reviewed   URINALYSIS WITH MICROSCOPIC - Abnormal; Notable for the following components:       Result Value    Ketones, Urine TRACE (*)     Leukocyte Esterase, Urine SMALL (*)     Bacteria, UA MODERATE (*)     All other components within normal limits   CBC WITH AUTO DIFFERENTIAL -  Abnormal; Notable for the following components:    Hemoglobin 10.6 (*)     Hematocrit 35.5 (*)     MCH 24.7 (*)     RDW 25.6 (*)     All other components within normal limits   BASIC METABOLIC PANEL - Abnormal; Notable for the following components:    Glucose 112 (*)     Sodium 133 (*)     CO2 18 (*)     All other components within normal limits   HEPATIC FUNCTION PANEL - Abnormal; Notable for the following components:    Alb 3.3 (*)     Alkaline Phosphatase 149 (*)     Total Bilirubin 0.21 (*)     All other components within normal limits   T3, FREE   T4, FREE   TSH WITHOUT REFLEX   TROPONIN   APTT   PROTIME-INR   BRAIN NATRIURETIC PEPTIDE   TROPONIN   POC CHEMISTRY (NA,K,ICA,GLU,CALC HCT/HGB,LACTATE,CREA,CL)         EMERGENCY DEPARTMENT COURSE:     -------------------------  BP: 109/81, Temp: 98.9 ??F (37.2 ??C), Pulse: 100, Resp: 20      Comments    23 year old there are at [redacted] weeks gestation, prior preeclampsia, presents with tachycardia to the 130s and chest pain and chest pressure.  Is having some shortness of breath.  Blood pressure within normal limits however is having significant chest symptoms.  Has had a mild cough however no other sick symptoms.  Appears fatigued and uncomfortable.    *With 2 large-bore IVs, placed on monitor, point-of-care ultrasound as well as cardiac and eclampsia  labs.  Consulted OB for toco metric monitoring as well as consult, point-of-care labs and CT head and CTA chest.    Heart rate improved with fluids however patient still symptomatic and borderline tachycardic, admit to OB/GYN    (Please note that portions of this note were completed with a voice recognition program.  Efforts were made to edit the dictations but occasionally words are mis-transcribed.)      Ernestine Mcmurray,, MD  Attending Emergency Physician          Ernestine Mcmurray, MD  07/21/18 1538
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
## 2018-07-21 NOTE — Other (Signed)
Pt presents to L and D, accompanied by ED, via wheelchair.  Pt here for s/p fall yesterday. Pt states she fell in the bathtub and hit her face. Pt states she still has a headache and "hurts all over." No abrasions or ecchymosis noted. Pt does not provide history; states she doesn't remember much. Pt also states she is very dizzy. She was instructed not to get out of bed without RN. Pt denies LOF or vaginal bleeding. +FM.    Pt gowned and in bed, oriented to room and call light.  EFM explained and applied.     Dr. Sherlynn Carbon notified of admission.

## 2018-07-22 LAB — TYPE AND SCREEN
ABO/Rh: O POS
Antibody Screen: NEGATIVE

## 2018-07-22 LAB — EKG 12-LEAD
Atrial Rate: 132 {beats}/min
P Axis: 15 degrees
P-R Interval: 126 ms
Q-T Interval: 292 ms
QRS Duration: 70 ms
QTc Calculation (Bazett): 432 ms
R Axis: 61 degrees
T Axis: 19 degrees
Ventricular Rate: 132 {beats}/min

## 2018-07-22 MED ORDER — LACTATED RINGERS IV SOLN
INTRAVENOUS | Status: DC
Start: 2018-07-22 — End: 2018-07-23
  Administered 2018-07-22: 13:00:00 via INTRAVENOUS

## 2018-07-22 MED ORDER — LANOLIN EX OINT
CUTANEOUS | Status: DC | PRN
Start: 2018-07-22 — End: 2018-07-23

## 2018-07-22 MED ORDER — IBUPROFEN 800 MG PO TABS
800 MG | Freq: Three times a day (TID) | ORAL | Status: DC | PRN
Start: 2018-07-22 — End: 2018-07-23
  Administered 2018-07-22: 16:00:00 800 mg via ORAL

## 2018-07-22 MED ORDER — OXYTOCIN 30 UNITS IN 500 ML INFUSION
30 | INTRAVENOUS | Status: DC
Start: 2018-07-22 — End: 2018-07-23
  Administered 2018-07-22: 12:00:00 334 m[IU]/min via INTRAVENOUS

## 2018-07-22 MED ORDER — PROMETHAZINE HCL 25 MG/ML IJ SOLN
25 MG/ML | Freq: Once | INTRAMUSCULAR | Status: AC
Start: 2018-07-22 — End: 2018-07-21
  Administered 2018-07-22: 03:00:00 25 mg via INTRAMUSCULAR

## 2018-07-22 MED ORDER — BENZOCAINE-MENTHOL 20-0.5 % EX AERO
CUTANEOUS | Status: DC | PRN
Start: 2018-07-22 — End: 2018-07-23

## 2018-07-22 MED ORDER — TETANUS-DIPHTH-ACELL PERTUSSIS 5-2.5-18.5 LF-MCG/0.5 IM SUSP
INTRAMUSCULAR | Status: DC
Start: 2018-07-22 — End: 2018-07-23

## 2018-07-22 MED ORDER — WITCH HAZEL-GLYCERIN EX PADS
CUTANEOUS | Status: DC | PRN
Start: 2018-07-22 — End: 2018-07-23

## 2018-07-22 MED ORDER — MEDROXYPROGESTERONE ACETATE 150 MG/ML IM SUSP
150 MG/ML | Freq: Once | INTRAMUSCULAR | Status: AC
Start: 2018-07-22 — End: 2018-07-23
  Administered 2018-07-23: 18:00:00 150 mg via INTRAMUSCULAR

## 2018-07-22 MED ORDER — ROPIVACAINE HCL 2 MG/ML IJ SOLN
INTRAMUSCULAR | Status: AC
Start: 2018-07-22 — End: 2018-07-22

## 2018-07-22 MED ORDER — NALBUPHINE HCL 10 MG/ML IJ SOLN
10 MG/ML | INTRAMUSCULAR | Status: DC | PRN
Start: 2018-07-22 — End: 2018-07-22

## 2018-07-22 MED ORDER — ACETAMINOPHEN 500 MG PO TABS
500 MG | Freq: Four times a day (QID) | ORAL | Status: DC | PRN
Start: 2018-07-22 — End: 2018-07-23
  Administered 2018-07-22: 21:00:00 1000 mg via ORAL

## 2018-07-22 MED ORDER — ONDANSETRON HCL 4 MG/2ML IJ SOLN
4 MG/2ML | Freq: Four times a day (QID) | INTRAMUSCULAR | Status: DC | PRN
Start: 2018-07-22 — End: 2018-07-23

## 2018-07-22 MED ORDER — LIDOCAINE-EPINEPHRINE 1.5 %-1:200000 IJ SOLN
1.5 %-1:200000 | INTRAMUSCULAR | Status: DC | PRN
Start: 2018-07-22 — End: 2018-07-22
  Administered 2018-07-22: 09:00:00 3 via EPIDURAL

## 2018-07-22 MED ORDER — NALOXONE HCL 0.4 MG/ML IJ SOLN
0.4 MG/ML | INTRAMUSCULAR | Status: DC | PRN
Start: 2018-07-22 — End: 2018-07-22

## 2018-07-22 MED ORDER — MORPHINE SULFATE (PF) 10 MG/ML IJ SOLN
10 MG/ML | Freq: Once | INTRAMUSCULAR | Status: AC
Start: 2018-07-22 — End: 2018-07-21
  Administered 2018-07-22: 03:00:00 10 mg via INTRAMUSCULAR

## 2018-07-22 MED ORDER — OXYTOCIN 30 UNITS IN 500 ML INFUSION
30 UNIT/500ML | INTRAVENOUS | Status: DC
Start: 2018-07-22 — End: 2018-07-22
  Administered 2018-07-22: 03:00:00 1 m[IU]/min via INTRAVENOUS

## 2018-07-22 MED ORDER — DOCUSATE SODIUM 100 MG PO CAPS
100 MG | Freq: Two times a day (BID) | ORAL | Status: DC
Start: 2018-07-22 — End: 2018-07-23
  Administered 2018-07-23: 13:00:00 100 mg via ORAL

## 2018-07-22 MED ORDER — ROPIVACAINE HCL 2 MG/ML IJ SOLN
INTRAMUSCULAR | Status: DC | PRN
Start: 2018-07-22 — End: 2018-07-22
  Administered 2018-07-22 (×2): 5 via EPIDURAL

## 2018-07-22 MED ORDER — MAGNESIUM HYDROXIDE 400 MG/5ML PO SUSP
400 MG/5ML | Freq: Every day | ORAL | Status: DC | PRN
Start: 2018-07-22 — End: 2018-07-23

## 2018-07-22 MED ORDER — ONDANSETRON HCL 4 MG PO TABS
4 MG | ORAL | Status: DC | PRN
Start: 2018-07-22 — End: 2018-07-23

## 2018-07-22 MED ORDER — BISACODYL 10 MG RE SUPP
10 MG | Freq: Every day | RECTAL | Status: DC | PRN
Start: 2018-07-22 — End: 2018-07-23

## 2018-07-22 MED ORDER — ROPIVACAINE 0.2% IN SODIUM CHLORIDE 0.9% 100 ML (OB) EPIDURAL
Status: DC
Start: 2018-07-22 — End: 2018-07-22

## 2018-07-22 MED ORDER — HYDROCORTISONE 1 % EX CREA
1 % | CUTANEOUS | Status: DC | PRN
Start: 2018-07-22 — End: 2018-07-23

## 2018-07-22 MED ORDER — SIMETHICONE 80 MG PO CHEW
80 MG | Freq: Four times a day (QID) | ORAL | Status: DC | PRN
Start: 2018-07-22 — End: 2018-07-23

## 2018-07-22 MED ORDER — ROPIVACAINE HCL 2 MG/ML IJ SOLN
INTRAMUSCULAR | Status: DC | PRN
Start: 2018-07-22 — End: 2018-07-22
  Administered 2018-07-22: 09:00:00 10 via EPIDURAL

## 2018-07-22 MED ORDER — NORMAL SALINE FLUSH 0.9 % IV SOLN
0.9 | INTRAVENOUS | Status: DC | PRN
Start: 2018-07-22 — End: 2018-07-23

## 2018-07-22 MED FILL — PROMETHAZINE HCL 25 MG/ML IJ SOLN: 25 MG/ML | INTRAMUSCULAR | Qty: 1

## 2018-07-22 MED FILL — MAPAP 500 MG PO TABS: 500 MG | ORAL | Qty: 2

## 2018-07-22 MED FILL — HYDROCORTISONE 1 % EX CREA: 1 % | CUTANEOUS | Qty: 28.35

## 2018-07-22 MED FILL — ONDANSETRON HCL 4 MG/2ML IJ SOLN: 4 MG/2ML | INTRAMUSCULAR | Qty: 2

## 2018-07-22 MED FILL — OXYTOCIN 30 UNITS IN 500 ML INFUSION: 30 UNIT/500ML | INTRAVENOUS | Qty: 500

## 2018-07-22 MED FILL — MORPHINE SULFATE 10 MG/ML IJ SOLN: 10 mg/mL | INTRAMUSCULAR | Qty: 1

## 2018-07-22 MED FILL — DOCUSATE SODIUM 100 MG PO CAPS: 100 MG | ORAL | Qty: 1

## 2018-07-22 MED FILL — IBUPROFEN 800 MG PO TABS: 800 MG | ORAL | Qty: 1

## 2018-07-22 MED FILL — ROPIVACAINE HCL 2 MG/ML IJ SOLN: INTRAMUSCULAR | Qty: 100

## 2018-07-22 NOTE — Other (Signed)
Upon shift assessment, pt requested IV be taken out. She stated it was hurting her and she keeps accidently pulling on it. I encouraged pt to leave in due to risks and possible need for more fluids or blood but pt stated she wanted it out. I even attempted to place another IV in a different site but pt refused to be stuck again.  Pt is aware that if any emergency shall arise for need of an IV that we will have to place another one, pt stated understanding. IV d/c'd. Dr. Neva Seat informed of this

## 2018-07-22 NOTE — Lactation Note (Signed)
Mom ready to try at breast,  Baby taken from swaddle placed to left breast in football hold. Baby opens very shallow, lifting tongue.  Showed mom how to bring onto breast chin first, Baby with little effort, mom says she will do STS then try again Mom has med necessity to get signed for breast pump.

## 2018-07-22 NOTE — Lactation Note (Signed)
Parents asleep together in bed, baby asleep on back in crib beside bed.

## 2018-07-22 NOTE — Other (Signed)
Patient admitted to room 735.   Oriented to room and surroundings.  Plan of care reviewed.  Verbalized understanding.  Instructed on infant security and safe sleep practices.  Preventing falls education provided .The following handouts given: A New Beginning: Your Guide to Postpartum Care, Rounding, Hugs Security System,Babies Cry A lot, Safe Sleep, Security and Visitation Guidelines.   Call light placed within reach.

## 2018-07-22 NOTE — Progress Notes (Signed)
Labor Progress Note    Brenda Zamora is a 23 y.o. female [phone] at [redacted]w[redacted]d  The patient was seen and examined. Her pain is not well controlled. She reports fetal movement is present, complains of contractions, denies loss of fluid, denies vaginal bleeding.       Vital Signs:  Vitals:    07/21/18 2302 07/22/18 0001 07/22/18 0105 07/22/18 0200   BP: 123/64 122/70 (!) 104/47 (!) 98/54   Pulse: 92 95 73 97   Resp:       Temp:  98.1 ??F (36.7 ??C)     TempSrc:       SpO2:       Weight:       Height:            FHT: 115, moderate variability, accelerations present, decelerations absent  Contractions: irregular, every 1-5 minutes  Cervical Exam: 4 cm dilated, 70 effaced, -1 station  Pitocin: @ 10 mu/min    Membranes: Intact  Scalp Electrode in place: absent  Intrauterine Pressure Catheter in Place: absent    Interventions: SVE performed    Assessment/Plan:  Brenda Zamora is a 23 y.o. female [phone] at [redacted]w[redacted]d admitted for IOL 2/2 gHTN (newly diagnosed)              - GBS negative, No indication for GBS prophylaxis  ????????????????????????- Isolated elevated BP, no severe range pressures  ????????????????????????- PreE labs wnl x1, P/C 0.13  ????????????????????????- Patient denies s/s preE at this time  ????????????????????????- cEFM/TOCO  ????????????????????????- S/p Cytotec PV x1  ????????????????????????- S/p foley balloon  ????????????????????????- Continue pitocin per protocol   - S/p morphine/phenergan x1   - Patient requesting epidural, then plan for AROM    Attending updated and in agreement with plan    Alver Fisher, DO  Ob/Gyn Resident  07/22/2018, 3:43 AM

## 2018-07-22 NOTE — Progress Notes (Signed)
Labor Progress Note    Brenda Zamora is a 23 y.o. female [phone] at [redacted]w[redacted]d  The patient was seen and examined. Her pain is well controlled. She reports fetal movement is present, complains of contractions, denies loss of fluid, denies vaginal bleeding. Resting comfortably.    Vital Signs:  Vitals:    07/21/18 2000 07/21/18 2302 07/22/18 0001 07/22/18 0105   BP: (!) 125/57 123/64 122/70 (!) 104/47   Pulse: 108 92 95 73   Resp:       Temp: 98.8 ??F (37.1 ??C)  98.1 ??F (36.7 ??C)    TempSrc: Oral      SpO2:       Weight:       Height:            FHT: 120, moderate variability, accelerations present, subtle early decelerations present  Contractions: regular, every 1-2 minutes  Cervical Exam: deferred  Pitocin: @ 6 mu/min    Membranes: Intact  Scalp Electrode in place: absent  Intrauterine Pressure Catheter in Place: absent    Interventions: none    Assessment/Plan:  Brenda Zamora is a 23 y.o. female [phone] at [redacted]w[redacted]d admitted for IOL 2/2 gHTN (newly diagnosed)   - GBS negative, No indication for GBS prophylaxis              - Rare elevated BP, no severe range pressures              - PreE labs wnl x1, P/C 0.13              - Patient admits to mild headache and some nausea, denies other s/s preE              - cEFM/TOCO              - S/p Cytotec PV x1              - S/p foley balloon              - Continue pitocin per protocol  ??  S/p Fall 07/20/18              - Patient resting comfortably              - CT Neck/Cervical Spine: no acute abnormalities  ??  Maternal Tachycardia/Chest Pain/Dyspnea              - Tachycardia improved              - Admits to improvement in symptoms              - BNP 145, troponin negative in ED              - CT Chest: Negative for PE              - Thyroid studies wnl    Attending updated and in agreement with plan    Alver Fisher, DO  Ob/Gyn Resident  07/22/2018, 2:34 AM

## 2018-07-22 NOTE — Other (Addendum)
Brenda Noa  CRNA at bedside. 0421  positioned for epidural  0434 catheter placed. 0434test dose given. 0439  loading dose given. 0439   positioned to low fowlers with left uterine displacement. 0443  pump initiated. Lidocaine reaction discussed. No anaphylactic reactions occur with administration. Pt has lidocaine previous. States has itching

## 2018-07-22 NOTE — Progress Notes (Signed)
Labor Progress Note    Brenda Zamora is a 23 y.o. female [phone] at [redacted]w[redacted]d  The patient was seen and examined. Her pain is well controlled. She reports fetal movement is present, complains of contractions, complains of loss of fluid, denies vaginal bleeding. Frequent position changes due to decels noted on fetal heart tracing. S/p IVF bolus.    Vital Signs:  Vitals:    07/22/18 0531 07/22/18 0615 07/22/18 0616 07/22/18 0620   BP: 122/65      Pulse: 67      Resp:       Temp:       TempSrc:       SpO2:  100% 100% 100%   Weight:       Height:            FHT: 120, moderate variability, accelerations present, early, variable, and occasional late decelerations present  Contractions: regular, every 2-3 minutes  Cervical Exam: 5 cm dilated, 80 effaced, 0 station  Pitocin: @ 14 mu/min    Membranes: Ruptured clear fluid  Scalp Electrode in place: absent  Intrauterine Pressure Catheter in Place: present    Interventions: Intrauterine resuscitation performed due to decelerations on fetal heart tracing. S/p IVF bolus, frequent position changes.    Assessment/Plan:  Brenda Zamora is a 23 y.o. female [phone] at [redacted]w[redacted]d admitted for IOL 2/2 gHTN              - GBS negative, No indication for GBS prophylaxis  ????????????????????????- Few??elevated BPs, no severe range pressures  ????????????????????????- PreE labs wnl x1, P/C 0.13  ????????????????????????- Patient denies s/s preE at this time  ????????????????????????- cEFM/TOCO  ????????????????????????- S/p Cytotec PV x1  ????????????????????????- S/p foley balloon  ????????????????????????- S/p morphine/phenergan x1  ????????????????????????-??Patient comfortable with epidural  ????????????????????????- AROM (clr) @0500               - IUPC in place              - Intrauterine resuscitation performed  ????????????????????????- Continue pitocin per protocol, will consider cutting pitocin in half or discontinue if decelerations continue    Attending updated and in agreement with plan    Alver Fisher, DO  Ob/Gyn Resident  07/22/2018, 7:01 AM

## 2018-07-22 NOTE — Plan of Care (Signed)
Problem: Pain - Acute:  Goal: Pain level will decrease  Description: Pain level will decrease  07/22/2018 2300 by Joanna Hews, RN  Outcome: Ongoing  07/22/2018 1748 by Lona Millard, RN  Outcome: Ongoing

## 2018-07-22 NOTE — Progress Notes (Signed)
Labor Progress Note    Brenda Zamora is a 23 y.o. female [phone] at [redacted]w[redacted]d  The patient was seen and examined. Her pain is well controlled. She reports fetal movement is present, complains of contractions, denies loss of fluid, denies vaginal bleeding. Informed by RN that balloon out with gentle traction.       Vital Signs:  Vitals:    07/21/18 1538 07/21/18 1652 07/21/18 2000 07/21/18 2302   BP: 120/64 119/87 (!) 125/57 123/64   Pulse: 98 98 108 92   Resp: 18 16     Temp: 98.1 ??F (36.7 ??C)  98.8 ??F (37.1 ??C)    TempSrc: Oral  Oral    SpO2:       Weight:       Height:            FHT: 120, moderate variability, accelerations present, decelerations absent  Contractions: regular, every 1-2 minutes  Cervical Exam: 3-4 cm dilated, 50 effaced, -2 station  Pitocin: @ 1 mu/min    Membranes: Intact  Scalp Electrode in place: absent  Intrauterine Pressure Catheter in Place: absent    Interventions: Foley out, SVE performed    Assessment/Plan:  Brenda Zamora is a 23 y.o. female [phone] at [redacted]w[redacted]d admitted for IOL 2/2 gHTN (newly diagnosed)   - GBS negative, No indication for GBS prophylaxis              - Rare elevated BP, no severe range pressures              - PreE labs wnl x1, P/C 0.13              - Patient admits to mild headache and some nausea, denies other s/s preE              - cEFM/TOCO              - S/p Cytotec PV x1              - S/p foley balloon              - Continue pitocin per protocol  ??  S/p Fall 07/20/18              - Patient evaluated in the ED, admits to mild headache              - Patient declines Tylenol at this time              - Denies other s/s preE at this time              - CT Neck/Cervical Spine: no acute abnormalities  ??  Maternal Tachycardia/Chest Pain/Dyspnea              - Currently borderline tachycardia              - Admits to improvement in symptoms              - BNP 145, troponin negative in ED              - CT Chest: Negative for PE              - Thyroid studies wnl    Senior  resident updated and in agreement with plan    Alver Fisher, DO  Ob/Gyn Resident  07/22/2018, 12:29 AM

## 2018-07-22 NOTE — Lactation Note (Signed)
Called to assist mom with latch in LD, mom has baby near left breast.  Attempted to arouse baby and assist mom to latch, baby making no effort to open, arches away when lips stroked.  Mom says he did a little earlier, will just keep him STS for now.  Reports having nursed other child.  Primary RN updated on same. FOB at bedside, Mom trying to eat breakfast.

## 2018-07-22 NOTE — Anesthesia Post-Procedure Evaluation (Signed)
Department of Anesthesiology  Postprocedure Note    Patient: Brenda Zamora  MRN: [id number]  Birthdate: 08/31/1995  Date of evaluation: 07/22/2018  Time:  11:46 AM     Procedure Summary     Date:  07/22/18 Room / Location:      Anesthesia Start:  0425 Anesthesia Stop:  0805    Procedure:  Labor Analgesia Diagnosis:      Scheduled Providers:   Responsible Provider:  Harrie Foreman, MD    Anesthesia Type:  epidural ASA Status:  2          Anesthesia Type: epidural    Aldrete Phase I:      Aldrete Phase II:      Last vitals: Reviewed and per EMR flowsheets.       Anesthesia Post Evaluation    Patient location during evaluation: floor  Patient participation: complete - patient participated  Level of consciousness: awake  Pain score: 0  Airway patency: patent  Nausea & Vomiting: no vomiting and no nausea  Complications: no  Cardiovascular status: blood pressure returned to baseline  Respiratory status: acceptable  Hydration status: stable

## 2018-07-22 NOTE — Progress Notes (Signed)
Labor Progress Note    Brenda Zamora is a 23 y.o. female [phone] at [redacted]w[redacted]d  The patient was seen and examined. Her pain is well controlled. She reports fetal movement is present, complains of contractions, denies loss of fluid, denies vaginal bleeding. AROM performed, moderate amount of clear fluid noted. Some early decelerations noted after AROM, will monitor closely.     Vital Signs:  Vitals:    07/22/18 0445 07/22/18 0446 07/22/18 0451 07/22/18 0501   BP: 117/76  117/79 117/66   Pulse: 97  106 89   Resp:       Temp:       TempSrc:       SpO2: 100% 100%     Weight:       Height:            FHT: 120, moderate variability, accelerations present, some early decelerations noted after ROM, will monitor closely  Contractions: regular, every 2 minutes  Cervical Exam: 4-5 cm dilated, 80 effaced, -1 station  Pitocin: @ 10 mu/min    Membranes: Ruptured clear fluid  Scalp Electrode in place: absent  Intrauterine Pressure Catheter in Place: absent    Interventions: AROM performed, clear fluid noted    Assessment/Plan:  [name] is a 23 y.o. female [phone] at [redacted]w[redacted]d admitted for IOL 2/2 gHTN (newly diagnosed)  ????????????????????????- GBS negative, No indication for GBS prophylaxis  ????????????????????????- Isolated elevated BP, no severe range pressures  ????????????????????????- PreE labs wnl x1, P/C 0.13  ????????????????????????- Patient denies s/s preE at this time  ????????????????????????- cEFM/TOCO  ????????????????????????- S/p Cytotec PV x1  ????????????????????????- S/p foley balloon              - S/p morphine/phenergan x1              - Patient comfortable with epidural   - AROM performed, clear fluid noted   - Continue pitocin per protocol    Senior resident updated and in agreement with plan    Alver Fisher, DO  Ob/Gyn Resident  07/22/2018, 5:12 AM

## 2018-07-22 NOTE — Plan of Care (Incomplete)
Problem: Pain - Acute:  Goal: Pain level will decrease  Description: Pain level will decrease  Outcome: Ongoing
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
## 2018-07-22 NOTE — Anesthesia Procedure Notes (Signed)
Epidural Block    Patient location during procedure: OB  Start time: 07/22/2018 4:29 AM  End time: 07/22/2018 4:39 AM  Reason for block: labor epidural  Staffing  Resident/CRNA: Vonita Moss, APRN - CRNA  Preanesthetic Checklist  Completed: patient identified, site marked, surgical consent, pre-op evaluation, timeout performed, IV checked, risks and benefits discussed, monitors and equipment checked, anesthesia consent given, oxygen available and patient being monitored  Epidural  Patient position: sitting  Prep: Betadine  Patient monitoring: continuous pulse ox and frequent blood pressure checks  Approach: midline  Location: lumbar (1-5)  Injection technique: LOR air  Provider prep: mask and sterile gloves  Needle  Needle type: Tuohy   Needle gauge: 17 G  Needle length: 3.5 in  Needle insertion depth: 5 cm  Catheter type: side hole  Catheter size: 19 G  Catheter at skin depth: 11 cm  Test dose: negative  Assessment  Sensory level: T6  Hemodynamics: stable  Attempts: 1

## 2018-07-22 NOTE — Progress Notes (Addendum)
Labor Progress Note    Brenda Zamora is a 23 y.o. female [phone] at [redacted]w[redacted]d  The patient was seen and examined. Her pain is well controlled. She reports fetal movement is present, complains of contractions, complains of loss of fluid, denies vaginal bleeding. IUPC placed without difficulty.      Vital Signs:  Vitals:    07/22/18 0446 07/22/18 0451 07/22/18 0501 07/22/18 0531   BP:  117/79 117/66 122/65   Pulse:  106 89 67   Resp:       Temp:       TempSrc:       SpO2: 100%      Weight:       Height:            FHT: 120, moderate variability, accelerations present, some early and variable decelerations present  Contractions: regular, every 2-4 minutes  Cervical Exam: 4-5 cm dilated, 80 effaced, -1 station  Pitocin: @ 14 mu/min    Membranes: Ruptured clear fluid  Scalp Electrode in place: absent  Intrauterine Pressure Catheter in Place: present    Interventions: IUPC placed without difficulty    Assessment/Plan:  Brenda Zamora is a 23 y.o. female [phone] at [redacted]w[redacted]d admitted for IOL 2/2 gHTN (newly diagnosed)   - GBS negative, No indication for GBS prophylaxis  ????????????????????????- Few??elevated BPs, no severe range pressures  ????????????????????????- PreE labs wnl x1, P/C 0.13  ????????????????????????- Patient denies s/s preE at this time  ????????????????????????- cEFM/TOCO  ????????????????????????- S/p Cytotec PV x1  ????????????????????????- S/p foley balloon  ????????????????????????- S/p morphine/phenergan x1  ????????????????????????- Patient comfortable with epidural              - AROM (clr) @0500    - IUPC placed without difficulty   - IVF bolus ordered              - Continue pitocin per protocol    Senior resident updated and in agreement with plan    Alver Fisher, DO  Ob/Gyn Resident  07/22/2018, 6:18 AM
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
## 2018-07-23 LAB — CBC
Hematocrit: 33.7 % — ABNORMAL LOW (ref 36.3–47.1)
Hemoglobin: 9.9 g/dL — ABNORMAL LOW (ref 11.9–15.1)
MCH: 24.9 pg — ABNORMAL LOW (ref 25.2–33.5)
MCHC: 29.4 g/dL (ref 28.4–34.8)
MCV: 84.7 fL (ref 82.6–102.9)
NRBC Automated: 0 per 100 WBC
RBC: 3.98 m/uL (ref 3.95–5.11)
RDW: 25.8 % — ABNORMAL HIGH (ref 11.8–14.4)
WBC: 10.1 10*3/uL (ref 3.5–11.3)

## 2018-07-23 LAB — IMMATURE CELLS
Platelet, Fluorescence: 213 10*3/uL (ref 138–453)
Platelet, Immature Fraction: 6.7 % (ref 1.1–10.3)

## 2018-07-23 LAB — CULTURE, URINE: Culture: NO GROWTH

## 2018-07-23 MED ORDER — IBUPROFEN 800 MG PO TABS
800 MG | ORAL_TABLET | Freq: Three times a day (TID) | ORAL | 0 refills | Status: DC | PRN
Start: 2018-07-23 — End: 2019-11-19

## 2018-07-23 MED ORDER — FERROUS SULFATE 325 (65 FE) MG PO TBEC
325 (65 Fe) MG | ORAL_TABLET | Freq: Two times a day (BID) | ORAL | 1 refills | Status: DC
Start: 2018-07-23 — End: 2020-04-27

## 2018-07-23 MED ORDER — DSS 100 MG PO CAPS
100 MG | ORAL_CAPSULE | Freq: Two times a day (BID) | ORAL | 0 refills | Status: DC
Start: 2018-07-23 — End: 2020-04-27

## 2018-07-23 MED FILL — MEDROXYPROGESTERONE ACETATE 150 MG/ML IM SUSP: 150 MG/ML | INTRAMUSCULAR | Qty: 1

## 2018-07-23 MED FILL — DOCUSATE SODIUM 100 MG PO CAPS: 100 MG | ORAL | Qty: 1
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
## 2018-07-23 NOTE — Care Coordination-Inpatient (Signed)
Writer met with patient at bedside s/p delivery of infant to assess dc needs. Anticipate DC home w/ infant 07/24/18 after vaginal delivery 07/22/18. No anticipated need for home care or dme.     Family has all items they need for infant.      Infant name on BC: [place].     Insurance for infant will be [employer] . Notified parent/guardian that they have 30 days from date of birth to add infant to insurance policy. They verbalized understanding.    Infant PCP: [place] denies needs at this time. Case management will continue to follow.

## 2018-07-23 NOTE — Other (Signed)
I have reviewed all [facility] Post-Birth Warning Signs and essential teaching points for pulmonary embolism, cardiac disease, hypertensive disorders of pregnancy, obstetric hemorrhage, venous thromboembolism, infection, and postpartum depression with the patient and FOB. I have informed the patient on when to call their healthcare provider and when to call 911. I have discussed with the patient  the importance of scheduling a follow-up visit with their physician, nurse practitioner or midwife and provided them with correct contact information for appointment. I have provided the patient with a copy of the "Save Your Life" handout. The patient has acknowledged receiving and understanding this education with her signature.
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
## 2018-07-23 NOTE — Discharge Instructions (Signed)
Follow-up with your OB doctor in 2 weeks or as specified by your physician.     Please refer to A NEW BEGINNING- YOUR PERSONAL GUIDE TO POSTPARTUM CARE book provided in your room. Please take this book home with you to refer to.    For Breastfeeding moms, you can contact our lactation specialist,  with any problems or questions you may have.  Phone number [phone]. Feel free to leave voice mail and your call will be returned as soon as possible.     DIET  Eat a well balanced diet focusing on foods high in fiber and protein.   Drink 8-10 glasses of fluids daily, especially water.  To avoid constipation you may take a mild stool softener as recommended by your doctor or midwife.    ACTIVITY  Gradually increase your activity.  Resume exercise regimen only after advise by your doctor or midwife.  Avoid lifting anything heavier than your baby or a gallon of milk for SIX weeks.   Avoid driving 1 week for vaginal delivery and 2 weeks for cesarean section, unless otherwise instructed by physician.  Rise slowly from a lying to sitting and then a standing position.  Climb stairs carefully.  Use caution when carrying your baby up and down the stairs.  NO SEXUAL Activity for 6 weeks or until advised by your doctor; Nothing in vagina: intercourse, tampons, or douching.  Be prepared to discuss family planning at your follow-up OB visit.   You may feel tired or have a lack of energy.  You may continue your prenatal vitamin to replenish nutrients post delivery.  Nap when baby naps to catch up on sleep.   May shower, NO tub baths, swimming, or hot tubs.    EMOTIONS  You may feel moody, sad, teary, & overwhelmed for the first 2 weeks postpartum.  Contact your OB provider if you feel you may be showing signs of postpartum depression, or have thoughts of harming yourself or your infant.  If infant will not stop crying, contact another adult for help or place infant in their crib on their back and take a break.  NEVER shake your  infant.      BLEEDING  Vaginal bleeding will decrease in amount over the next few weeks.  You will notice that as your activity increases, your flow may increase.  This is your body's way of telling you, you need take things easier and rest more often.  Call your OB/ER if you are saturating one maxi pad in an hour & passing large clots.      BREAST CARE  Take medications as recommended by your doctor or midwife for pain  If you develop a warm, red, tender area on your breast or develop a fever contact your lactation consultant. [phone].      For breastfeeding moms:  If you become engorged, feeding may be more difficult or painful for 1-2 days.  You may find it helpful to hand express some milk so that the infant can latch on more easily.   While breastfeeding, continue to take your prenatal vitamins as directed by your doctor or midwife.   Refer to the breastfeeding booklet in the newborn folder/binder for more information.  For any questions or concerns contact a Lactation Consultant.  Leave a message and your call will be returned.         PERI CARE  Shower daily, perineum with mild soap.  Use the peri-bottle until bleeding stops each time you use   the restroom.  Cleanse your perineum from front to back.  If used, stitches will dissolve in 4-6 weeks.  You may use a sitz bath or soak in a clean tub with drain open and water running for comfort.  Kegel exercises will help restore bladder control.     SWELLING  Try to keep your legs elevated when you are sitting.   When lying down keep your legs elevated.    CALL THE DOCTOR WITH THESE HEALTH CONCERNS OR PROBLEMS  If you have a temp of 100.4or more.   If your bleeding has increased and you are saturating a pad in an hour.  Your abdomen is tender to touch.  You are passing blood clots bigger than the size of a lemon.  If you are experiencing extreme weakness or dizziness.   If you are having flu-like symptoms such as achy muscles or joints.  There is a foul smell  or a green color to your vaginal bleeding.  If you have pain that cannot be relieved.  You have persistent burning with urination or frequency.   Call if you have concerns about your well-being.  You are unable to sleep, eat, or are having thoughts of harming yourself or your baby.   You have a red, warm, tender area in you calf.         Learning About Preeclampsia After Childbirth  What is preeclampsia?    A woman with preeclampsia has blood pressure that is higher than usual. She may also have other serious symptoms.  Preeclampsia can be dangerous. When it is severe, it can cause seizures (eclampsia) or liver or kidney damage. When the liver is affected, some women get HELLP syndrome, a blood-clotting and bleeding problem. HELLP can come on quickly and can be deadly. This is why your doctor checks you and your baby often.  Preeclampsia usually occurs after 20 weeks of pregnancy. Most often, it starts near the end of pregnancy and goes away after childbirth. But symptoms may last a few weeks or more and can get worse after delivery. Rarely, symptoms of preeclampsia don't show up until days or even weeks after childbirth.  What are the symptoms?  Mild preeclampsia usually doesn't cause symptoms. But preeclampsia can cause rapid weight gain and sudden swelling of the hands and face.  Severe preeclampsia does cause symptoms. It can cause a very bad headache and trouble seeing and breathing. It also can cause belly pain. You may also urinate less than usual.  If you have new preeclampsia symptoms after you go home from the hospital, call your doctor right away.  What can you expect after you have had preeclampsia?  In the hospital  After the baby and the placenta are delivered, preeclampsia usually starts to improve. Most women get better in the first few days after childbirth.  After having preeclampsia, you still have a risk of seizures for a day or more after childbirth. (Very rarely, seizures happen later on.) So  your doctor may have you take magnesium sulfate for a day or more to prevent seizures. You may also take medicine to lower your blood pressure.  When you go home  Your blood pressure will most likely return to normal a few days after delivery. Your doctor will want to check your blood pressure sometime in the first week after you leave the hospital.  Some women still have high blood pressure 6 weeks after childbirth. But most return to normal levels over the long term.  Take and record your blood pressure at home if your doctor tells you to.  ? Learn the importance of the two measures of blood pressure (such as 120 over 80, or 120/80). The first number is the systolic pressure. This is the force of blood on the artery walls as the heart pumps. The second number is the diastolic pressure. This is the force of blood on the artery walls between heartbeats, when the heart is at rest. You have a choice of monitors to use.   Manual monitor: You pump up the cuff and use a stethoscope to listen for your pulse.   Electronic monitor: The cuff inflates, and a gauge shows your pulse rate.  ? To take your blood pressure:   Ask your doctor to check your blood pressure monitor to be sure that it is accurate and that the cuff fits you. Also ask your doctor to watch you use it, to make sure that you are using it right.   You should not eat, use tobacco products, or use medicine known to raise blood pressure (such as some nasal decongestant sprays) before you take your blood pressure.   Avoid taking your blood pressure if you have just exercised. Also avoid taking it if you are nervous or upset. Rest at least 15 minutes before you take your blood pressure.   Be safe with medicines. If you take medicine, take it exactly as prescribed. Call your doctor if you think you are having a problem with your medicine.   Do not smoke. Quitting smoking will help improve your baby's growth and health. If you need help quitting, talk to  your doctor about stop-smoking programs and medicines. These can increase your chances of quitting for good.   Eat a balanced and healthy diet that has lots of fruits and vegetables.  Long-term health  After you have had preeclampsia, you have a higher-than-average risk of heart disease, stroke, and kidney disease. This may be because the same things that cause preeclampsia also cause heart and kidney disease.  To protect your health, work with your doctor on living a heart-healthy lifestyle and getting the checkups you need. Your doctor may also want you to check your blood pressure at home.  Follow-up care is a key part of your treatment and safety. Be sure to make and go to all appointments, and call your doctor if you are having problems. It's also a good idea to know your test results and keep a list of the medicines you take.  Where can you learn more?  Go to [email or web address] and sign in to your MyChart account. Enter [phone] in the Search Health Information box to learn more about "Learning About Preeclampsia After Childbirth."     If you do not have an account, please click on the "Sign Up Now" link.  Current as of: May 29, 2019Content Version: 12.4   2006-2020 [employer].  Care instructions adapted under license by [facility]. If you have questions about a medical condition or this instruction, always ask your healthcare professional. [employer], [employer] disclaims any warranty or liability for your use of this information.         Postpartum: Care Instructions  Your Care Instructions  After childbirth (postpartum period), your body goes through many changes. Some of these changes happen over several weeks. In the hours after delivery, your body will begin to recover from childbirth while it prepares to breastfeed your newborn. You may feel emotional during this time. Your hormones  can shift your mood without warning for no clear reason.  In the first couple of  weeks after childbirth, many women have emotions that change from happy to sad. You may find it hard to sleep. You may cry a lot. This is called the "baby blues." These overwhelming emotions often go away within a couple of days or weeks. But it's important to discuss your feelings with your doctor.  It is easy to get too tired and overwhelmed during the first weeks after childbirth. Don't try to do too much. Get rest whenever you can, accept help from others, and eat well and drink plenty of fluids.  In the first couple of weeks after giving birth, your doctor or midwife may want to check in with you and make a plan for any follow-up care you may need. You will likely have a complete postpartum visit in the first 3 months after delivery. At that time, your doctor or midwife will check on your recovery from childbirth. He or she will also see how you are doing with your emotions and talk about your concerns or questions.  Follow-up care is a key part of your treatment and safety. Be sure to make and go to all appointments, and call your doctor if you are having problems. It's also a good idea to know your test results and keep a list of the medicines you take.  How can you care for yourself at home?   Sleep or rest when your baby sleeps.   Get help with household chores from family or friends, if you can. Do not try to do it all yourself.   If you have hemorrhoids or swelling or pain around the opening of your vagina, try using cold and heat. You can put ice or a cold pack on the area for 10 to 20 minutes at a time. Put a thin cloth between the ice and your skin. Also try sitting in a few inches of warm water (sitz bath) 3 times a day and after bowel movements.   Take pain medicines exactly as directed.  ? If the doctor gave you a prescription medicine for pain, take it as prescribed.  ? If you are not taking a prescription pain medicine, ask your doctor if you can take an over-the-counter medicine.   Eat more  fiber to avoid constipation. Include foods such as whole-grain breads and cereals, raw vegetables, raw and dried fruits, and beans.   Drink plenty of fluids, enough so that your urine is light yellow or clear like water. If you have kidney, heart, or liver disease and have to limit fluids, talk with your doctor before you increase the amount of fluids you drink.   Do not rinse inside your vagina with fluids (douche).   If you have stitches, keep the area clean by pouring or spraying warm water over the area outside your vagina and anus after you use the toilet.   Keep a list of questions to ask your doctor or midwife. Your questions might be about:  ? Changes in your breasts, such as lumps or soreness.  ? When to expect your menstrual period to start again.  ? What form of birth control is best for you.  ? Weight you have put on during the pregnancy.  ? Exercise options.  ? What foods and drinks are best for you, especially if you are breastfeeding.  ? Problems you might be having with breastfeeding.  ? When you can have sex.  Some women may want to talk about lubricants for the vagina.  ? Any feelings of sadness or restlessness that you are having.  When should you call for help?  Call 911 anytime you think you may need emergency care. For example, call if:    You have thoughts of harming yourself, your baby, or another person.     You passed out (lost consciousness).     You have chest pain, are short of breath, or cough up blood.     You have a seizure.   Call your doctor now or seek immediate medical care if:    Your vaginal bleeding seems to be getting heavier.     You are dizzy or lightheaded, or you feel like you may faint.     You have a fever.     You have new or more belly pain.     You have symptoms of a blood clot in your leg (called a deep vein thrombosis), such as:  ? Pain in the calf, back of the knee, thigh, or groin.  ? Redness and swelling in your leg or groin.     You have  signs of preeclampsia, such as:  ? Sudden swelling of your face, hands, or feet.  ? New vision problems (such as dimness, blurring, or seeing spots).  ? A severe headache.   Watch closely for changes in your health, and be sure to contact your doctor if:    You have new or worse vaginal discharge.     You feel sad or depressed.     You are having problems with your breasts or breastfeeding.   Where can you learn more?  Go to [email or web address] and sign in to your MyChart account. Enter [phone] in the Search Health Information box to learn more about "Postpartum: Care Instructions."     If you do not have an account, please click on the "Sign Up Now" link.  Current as of: May 29, 2019Content Version: 12.4   2006-2020 [employer].  Care instructions adapted under license by [facility]. If you have questions about a medical condition or this instruction, always ask your healthcare professional. [employer], [employer] disclaims any warranty or liability for your use of this information.

## 2018-07-25 LAB — SURGICAL PATHOLOGY

## 2018-07-25 NOTE — Care Coordination-Inpatient (Signed)
[facility] Transitions Initial Follow Up Call    Call within 2 business days of discharge: Yes    Patient: Brenda Zamora Patient DOB: February 03, 1996   MRN: [id number]  Reason for Admission: Dizziness/fall/Covid-19 Monitoring  Discharge Date: 07/23/18 RARS: Readmission Risk Score: 12      Last Discharge [facility] Facility       Complaint Diagnosis Description Type Department Provider    07/21/18 Chest Pain; Dizziness; Headache Dizziness ... ED to Hosp-Admission (Discharged) (ADMITTED) [facility] Ronalee Red, MD; Kathaleen Grinder...           Spoke with: Gustine Fancy    Was able to contact [facility] for initial Covid-19 outreach.  She stated that she and the baby are doing fine.  She has her follow up scheduled.    Facility:[facility]    Patient contacted regarding COVID-19 ??risk.  Care Transition Nurse/ Ambulatory Care Manager contacted the patient by telephone to perform post discharge assessment. Verified name and DOB with patient as identifiers. Provided introduction to self, and explanation of the CTN/ACM role, and reason for call due to risk factors for infection and/or exposure to COVID-19.     Symptoms reviewed with patient who verbalized the following symptoms: fatigue.      Due to no new or worsening symptoms encounter was not routed to provider for escalation.      Patient has following risk factors of: asthma. CTN/ACM reviewed discharge instructions, medical action plan and red flags such as increased shortness of breath, increasing fever and signs of decompensation with patient who verbalized understanding.   Discussed exposure protocols and quarantine with CDC Guidelines ???What to do if you are sick with coronavirus disease 2019.??? Patient was given an opportunity for questions and concerns. The patient agrees to contact the [facility] exposure line [phone], local health department [place] Department of Health: [phone]) and PCP office for questions related to their healthcare. CTN/ACM provided contact information for future  needs.    Reviewed and educated patient on any new and changed medications related to discharge diagnosis     Patient/family/caregiver given information for GetWell Loop and agrees to enroll yes  Patient's preferred e-mail: [email or web address]   Patient's preferred phone number: [phone]  Based on Loop alert triggers, patient will be contacted by nurse care manager for worsening symptoms.    Pt will be further monitored by COVID Loop Team?? based on severity of symptoms and risk factors.    Care Transitions 24 Hour Call    Care Transitions Interventions         Follow Up  Future Appointments   Date Time Provider Department Center   07/29/2018 11:00 AM [address] [place], DO [facility]   09/09/2018  1:30 PM Rowan Blase Quinn Axe, MD [facility]       Olegario Messier Ursula Beath, RN

## 2018-07-26 ENCOUNTER — Encounter: Attending: Student in an Organized Health Care Education/Training Program | Primary: Family Medicine

## 2018-07-26 ENCOUNTER — Other Ambulatory Visit: Payer: Self-pay

## 2018-07-26 ENCOUNTER — Ambulatory Visit: Payer: Self-pay | Admitting: Oncology

## 2018-07-26 ENCOUNTER — Ambulatory Visit: Payer: Self-pay

## 2018-07-29 ENCOUNTER — Encounter: Attending: Student in an Organized Health Care Education/Training Program | Primary: Family Medicine

## 2018-07-29 NOTE — Telephone Encounter (Signed)
Placed call too patient to reschedule no show, unsuccessful with reaching patient.     1ST ATTEMPT

## 2018-09-09 ENCOUNTER — Encounter: Attending: Gastroenterology | Primary: Family Medicine

## 2018-10-02 ENCOUNTER — Inpatient Hospital Stay
Admit: 2018-10-02 | Discharge: 2018-10-02 | Disposition: A | Discharge status: Home / Self Care | Payer: MEDICAID | Attending: Emergency Medicine

## 2018-10-02 DIAGNOSIS — N3001 Acute cystitis with hematuria: Secondary | ICD-10-CM

## 2018-10-02 LAB — MICROSCOPIC URINALYSIS
Epithelial Cells UA: 10 /HPF
RBC, UA: 2 /HPF
WBC, UA: 5 /HPF

## 2018-10-02 LAB — URINALYSIS WITH REFLEX TO CULTURE
Bilirubin Urine: NEGATIVE
Glucose, Ur: NEGATIVE
Ketones, Urine: NEGATIVE
Nitrite, Urine: NEGATIVE
Protein, UA: NEGATIVE
Specific Gravity, UA: 1.024 (ref 1.000–1.030)
Urobilinogen, Urine: NORMAL
pH, UA: 6 (ref 5.0–8.0)

## 2018-10-02 LAB — PREGNANCY, URINE: HCG(Urine) Pregnancy Test: NEGATIVE

## 2018-10-02 MED ORDER — PHENAZOPYRIDINE HCL 200 MG PO TABS
200 MG | ORAL_TABLET | Freq: Three times a day (TID) | ORAL | 0 refills | Status: AC | PRN
Start: 2018-10-02 — End: 2018-10-05

## 2018-10-02 MED ORDER — NITROFURANTOIN MONOHYD MACRO 100 MG PO CAPS
100 MG | ORAL_CAPSULE | Freq: Two times a day (BID) | ORAL | 0 refills | Status: AC
Start: 2018-10-02 — End: 2018-10-07

## 2018-10-02 NOTE — ED Provider Notes (Signed)
[place] [facility]  eMERGENCY dEPARTMENT eNCOUnter      Pt Name: Brenda HazardKori Zamora  MRN: [id number]  Birthdate 04/21/1995  Date of evaluation: 10/03/18      CHIEF COMPLAINT       Chief Complaint   Patient presents with   ??? Abdominal Pain   ??? Shoulder Pain     HISTORY OF PRESENT ILLNESS   HPI 23 y.o. female to the emergency department with complaints of abdominal pain.  Pain is suprapubic in location, cramping in character, constant in course, present for the last year.  She had vaginal delivery in May 2020.  She has been recovering well since that time but continues to have abdominal cramping.  She denies any vaginal discharge.  She denies any concerns for any sexually transmitted infections.  Patient also reports that she has pain in both of her shoulders and her trapezius muscles.    REVIEW OF SYSTEMS     Review of Systems   Constitutional: Negative for fever.   HENT: Negative for congestion.    Eyes: Negative for visual disturbance.   Respiratory: Negative for cough and shortness of breath.    Cardiovascular: Negative for chest pain.   Gastrointestinal: Positive for abdominal pain.   Genitourinary: Positive for dysuria and pelvic pain. Negative for vaginal bleeding, vaginal discharge and vaginal pain.   Musculoskeletal: Positive for arthralgias and myalgias.   Skin: Negative for pallor.   Neurological: Negative for headaches.         PAST MEDICAL HISTORY     Past Medical History:   Diagnosis Date   ??? Anemia     H/O iron transfusions in first preg    ??? Asthma    ??? Bipolar 1 disorder (HCC)    ??? Borderline personality disorder (HCC)    ??? Depression    ??? Gestational HTN 07/21/2018   ??? History of blood transfusion     during her first preg    ??? Insomnia    ??? PTSD (post-traumatic stress disorder)    ??? Schizophrenia (HCC)    ??? Sciatica 2017   ??? Seizure in childhood [facility](HCC) 01/27/2018   ??? Vertigo 07/2017       SURGICAL HISTORY       Past Surgical History:   Procedure Laterality Date   ??? BUNIONECTOMY     ??? WRIST SURGERY          CURRENT MEDICATIONS       Discharge Medication List as of 10/02/2018  8:52 AM      CONTINUE these medications which have NOT CHANGED    Details   ferrous sulfate (FE TABS) 325 (65 Fe) MG EC tablet Take 1 tablet by mouth 2 times daily, Disp-90 tablet, R-1Print      ibuprofen (ADVIL;MOTRIN) 800 MG tablet Take 1 tablet by mouth every 8 hours as needed for Pain, Disp-30 tablet, R-0Print      !! docusate sodium (COLACE, DULCOLAX) 100 MG CAPS Take 100 mg by mouth 2 times daily, Disp-60 capsule, R-0Print      aspirin EC 81 MG EC tablet Take 1 tablet by mouth daily, Disp-30 tablet, R-5Print      !! docusate sodium (COLACE) 100 MG capsule Take 100 mg by mouth 2 times daily as needed for ConstipationHistorical Med       !! - Potential duplicate medications found. Please discuss with provider.          ALLERGIES     is allergic to rocephin [ceftriaxone]; penicillins;  lidocaine viscous hcl; and zithromax [azithromycin].    FAMILY HISTORY     She indicated that her mother is alive. She indicated that her father is alive. She indicated that her sister is alive. She indicated that her brother is alive. She indicated that her maternal grandmother is alive. She indicated that her maternal grandfather is alive. She indicated that her paternal grandmother is deceased. She indicated that her paternal grandfather is alive.       SOCIAL HISTORY      reports that she has quit smoking. She has never used smokeless tobacco. She reports previous alcohol use. She reports previous drug use. Drug: Marijuana.    PHYSICAL EXAM     INITIAL VITALS: BP 127/83    Pulse 95    Temp 98.4 ??F (36.9 ??C) (Oral)    Resp 16    Ht 5\' 2"  (1.575 m)    Wt 193 lb (87.5 kg)    SpO2 99%    BMI 35.30 kg/m??   General: NAD  Head: Normocephalic, atraumatic  Eye: Pupils equal round reactive to light, no conjunctivitis  Heart: Regular rate and rhythm no murmurs  Lungs: Clear to auscultation bilaterally, no respiratory distress  Chest wall: No crepitus, no tenderness  palpation  Abdomen: Soft, nontender, nondistended, with no peritoneal signs  MSK: No cva ttp  Neurologic: Patient is alert and oriented x3, motor and sensation is intact in all 4 extremities, cerebellar function is normal  Extremities: Full range of motion, no cyanosis, no edema, no signs of trauma, no tenderness to palpation    MEDICAL DECISION MAKING:     MDM  23 y.o. female presents with c/o lower abdominal pain.  Differential diagnosis includes a urinary tract infection, ectopic pregnancy, ovarian torsion, sexually transmitted infection, kidney stone, appedicitis.  We'll check a urine pregnancy test, UA.  She is denying any concerns for STI,  Based on the patient's history and physical exam, I doubt appendicitis, PID, torsion, or obstructing kidney stone.     Emergency Department course:  UA appears infected. Treating with pyridium and macrobid.       DIAGNOSTIC RESULTS     LABS: All lab results were reviewed by myself, and all abnormals are listed below.  Labs Reviewed   URINE RT REFLEX TO CULTURE - Abnormal; Notable for the following components:       Result Value    Turbidity UA CLOUDY (*)     Urine Hgb SMALL (*)     Leukocyte Esterase, Urine SMALL (*)     All other components within normal limits   MICROSCOPIC URINALYSIS - Abnormal; Notable for the following components:    Bacteria, UA FEW (*)     All other components within normal limits   PREGNANCY, URINE       EMERGENCY DEPARTMENT COURSE:   Vitals:    Vitals:    10/02/18 0828 10/02/18 0830   BP: 127/83    Pulse: 95    Resp: 16    Temp:  98.4 ??F (36.9 ??C)   TempSrc:  Oral   SpO2: 99%    Weight: 193 lb (87.5 kg)    Height: 5\' 2"  (1.575 m)        The patient was given the following medications while in the emergency department:  Orders Placed This Encounter   Medications   ??? phenazopyridine (PYRIDIUM) 200 MG tablet     Sig: Take 1 tablet by mouth 3 times daily as needed for Pain (bladder spasm/pain)  Dispense:  6 tablet     Refill:  0   ??? nitrofurantoin,  macrocrystal-monohydrate, (MACROBID) 100 MG capsule     Sig: Take 1 capsule by mouth 2 times daily for 5 days     Dispense:  10 capsule     Refill:  0     -------------------------  CRITICAL CARE:   CONSULTS: None  PROCEDURES: Procedures     FINAL IMPRESSION      1. Bilateral shoulder pain, unspecified chronicity    2. Acute cystitis with hematuria          DISPOSITION/PLAN   DISPOSITION Decision To Discharge 10/02/2018 08:51:08 AM      PATIENT REFERRED TO:  Lucio Edward, MD  [address]  [place] [address]  [phone]    In 3 days      [facility] ED  [address]  [place] [address]  [phone]    If symptoms worsen      DISCHARGE MEDICATIONS:  Discharge Medication List as of 10/02/2018  8:52 AM      START taking these medications    Details   phenazopyridine (PYRIDIUM) 200 MG tablet Take 1 tablet by mouth 3 times daily as needed for Pain (bladder spasm/pain), Disp-6 tablet,R-0Print      nitrofurantoin, macrocrystal-monohydrate, (MACROBID) 100 MG capsule Take 1 capsule by mouth 2 times daily for 5 days, Disp-10 capsule,R-0Print               Darrick Grinder, MD  Attending Emergency Physician                     Darrick Grinder, MD  10/03/18 [phone]

## 2018-10-02 NOTE — ED Notes (Signed)
Pt discharged in stable condition with Rx's and discharge instructions, including follow up with PCP. Pt ambulates to door with steady gait and without assistance.      Tiney Rouge, RN  10/02/18 [phone]

## 2018-10-02 NOTE — ED Triage Notes (Addendum)
Mode of arrival (squad #, walk in, police, etc) : Walk In        Chief complaint(s): Abdominal cramping, shoulder pain        Arrival Note (brief scenario, treatment PTA, etc).: Pt arrives to ED c/o abdominal cramping that she states has been ongoing for about a year. Patient states that she has had an "inflamed tube" in the past that she was supposed to follow up with but states that she did not follow up. Patient states that she also has had pain in both of her shoulders and upper back. Patient denies any injury or trauma to the upper back. Patient is resting on stretcher with no s/s of distress.           C= "Have you ever felt that you should Cut down on your drinking?"  No  A= "Have people Annoyed you by criticizing your drinking?"  No  G= "Have you ever felt bad or Guilty about your drinking?"  No  E= "Have you ever had a drink as an Eye-opener first thing in the morning to steady your nerves or to help a hangover?"  No      Deferred []       Reason for deferring: N/A    *If yes to two or more: probable alcohol abuse.*

## 2018-10-03 NOTE — Care Coordination-Inpatient (Signed)
Patient contacted regarding recent discharge and COVID-19 risk. Discussed COVID-19 related testing which was not done at this time. Test results were not done. Patient informed of results, if available? na     Care Transition Nurse/ Ambulatory Care Manager contacted the patient by telephone to perform post discharge assessment. Verified name and DOB with patient as identifiers.     Patient has following risk factors of: asthma. CTN/ACM reviewed discharge instructions, medical action plan and red flags related to discharge diagnosis. Reviewed and educated them on any new and changed medications related to discharge diagnosis.  Advised obtaining a 90-day supply of all daily and as-needed medications.     Education provided regarding infection prevention, and signs and symptoms of COVID-19 and when to seek medical attention with patient who verbalized understanding. Discussed exposure protocols and quarantine from [facility] Guidelines ???Are you at higher risk for severe illness 2019??? and given an opportunity for questions and concerns. The patient agrees to contact the COVID-19 hotline [phone] or PCP office for questions related to their healthcare. CTN/ACM provided contact information for future reference.    From CDC: Are you at higher risk for severe illness?    ??? Wash your hands often.  ??? Avoid close contact (6 feet, which is about two arm lengths) with people who are sick.  ??? Put distance between yourself and other people if COVID-19 is spreading in your community.  ??? Clean and disinfect frequently touched surfaces.  ??? Avoid all cruise travel and non-essential air travel.  ??? Call your healthcare professional if you have concerns about COVID-19 and your underlying condition or if you are sick.    For more information on steps you can take to protect yourself, see CDC's How to Protect Yourself    Pt will be further monitored by COVID Loop Team?? based on severity of symptoms and risk factors.    Spoke to patient, no  new or worsening symptoms. Plans to have prescriptions filled today. Encouraged to follow up with providers

## 2018-10-03 NOTE — Care Coordination-Inpatient (Signed)
1st outreach for ED follow up / COVID monitoring. Patient isn't available, message left

## 2018-10-06 ENCOUNTER — Inpatient Hospital Stay
Admit: 2018-10-06 | Discharge: 2018-10-06 | Disposition: A | Discharge status: Home / Self Care | Payer: MEDICAID | Attending: Emergency Medicine

## 2018-10-06 ENCOUNTER — Emergency Department: Admit: 2018-10-06 | Payer: MEDICAID | Primary: Family Medicine

## 2018-10-06 DIAGNOSIS — S46919A Strain of unspecified muscle, fascia and tendon at shoulder and upper arm level, unspecified arm, initial encounter: Secondary | ICD-10-CM

## 2018-10-06 MED ORDER — DIAZEPAM 5 MG PO TABS
5 MG | ORAL_TABLET | Freq: Three times a day (TID) | ORAL | 0 refills | Status: AC | PRN
Start: 2018-10-06 — End: 2018-10-16

## 2018-10-06 MED ORDER — IBUPROFEN 800 MG PO TABS
800 MG | Freq: Once | ORAL | Status: AC
Start: 2018-10-06 — End: 2018-10-06
  Administered 2018-10-06: 16:00:00 800 mg via ORAL

## 2018-10-06 MED ORDER — DIAZEPAM 5 MG PO TABS
5 MG | Freq: Once | ORAL | Status: AC
Start: 2018-10-06 — End: 2018-10-06
  Administered 2018-10-06: 16:00:00 5 mg via ORAL

## 2018-10-06 MED FILL — DIAZEPAM 5 MG PO TABS: 5 MG | ORAL | Qty: 1

## 2018-10-06 MED FILL — IBUPROFEN 800 MG PO TABS: 800 MG | ORAL | Qty: 1

## 2018-10-06 NOTE — ED Triage Notes (Signed)
Pt ambulated to room 10.  Pt co pain in the hip and shoulders bilaterally, pain is 8 out of 10, and started a week ago.  Pt states that she is taking tylenol and ibuprofen for pain but it is not working.  PMS intact.  Pt respirations are even and unlabored, pt is oriented X 4, speaking in complete sentences, bed is in the lowest position, call light is within reach.  Will continue to monitor.

## 2018-10-06 NOTE — ED Provider Notes (Signed)
[facility]     Emergency Department     Faculty Attestation    I performed a history and physical examination of the patient and discussed management with the resident. I reviewed the resident???s note and agree with the documented findings and plan of care. Any areas of disagreement are noted on the chart. I was personally present for the key portions of any procedures. I have documented in the chart those procedures where I was not present during the key portions. I have reviewed the emergency nurses triage note. I agree with the chief complaint, past medical history, past surgical history, allergies, medications, social and family history as documented unless otherwise noted below.   For Physician Assistant/ Nurse Practitioner cases/documentation I have personally evaluated this patient and have completed at least one if not all key elements of the E/M (history, physical exam, and MDM). Additional findings are as noted.      Primary Care Physician:  Lucio Edward, MD    CHIEF COMPLAINT       Chief Complaint   Patient presents with   ??? Shoulder Pain   ??? Hip Pain       RECENT VITALS:   Temp: 98.4 ??F (36.9 ??C),  Pulse: 103, Resp: 16, BP: 122/82    LABS:  Labs Reviewed - No data to display      PERTINENT ATTENDING PHYSICIAN COMMENTS:    Here with bilateral shoulder pain also with a hip pain no chest pain no shortness of breath she gave birth 2 months ago has been taking Tylenol or ibuprofen for pain occasional shoulders pop    Critical Care          Lear Ng,  MD, [facility]  Attending Emergency  Physician              Lear Ng, MD  10/06/18 1245

## 2018-10-06 NOTE — ED Provider Notes (Signed)
[facility] ED  Emergency Department Encounter  Emergency Medicine Resident     Pt Name: Brenda Zamora  MRN: [id number]  [place] 01/31/1996  Date of evaluation: 10/06/18  PCP:  Lucio Edward, MD    [place]       Chief Complaint   Patient presents with   ??? Shoulder Pain   ??? Hip Pain       HISTORY OFPRESENT ILLNESS  (Location/Symptom, Timing/Onset, Context/Setting, Quality, Duration, Modifying Factors,Severity.)      Brenda Zamora is a 23 y.o.yo female who presents with bilateral shoulder pain x1 week.  No falls, MVC, or other trauma.  Patient does have a 63-year-old and 68-month-old.  She states she cares for him.  She is experiencing pain that goes up both sides of her neck and into her shoulders.  She states it is painful with any movements.  She is experiencing no numbness, tingling, weakness.  She states she did take Tylenol, ibuprofen, and 1 dose of Flexeril intermittently over the past several days without relief.  She states she does get some relief with ice but not heat.  She did not take any medication today.  She states she has a history of shoulder dislocations and was concerned that she might dislocate her shoulder yesterday because she felt a pop when she was reaching for something.  She states that this time her shoulder looked a little "droopy ".  However, she states it is now resolved.  She denies any fevers, cough, chest pain, shortness of breath.    PAST MEDICAL / SURGICAL / SOCIAL / FAMILY HISTORY      has a past medical history of Anemia, Asthma, Bipolar 1 disorder ([place]), Borderline personality disorder ([place]), Depression, Gestational HTN, History of blood transfusion, Insomnia, PTSD (post-traumatic stress disorder), Schizophrenia ([place]), Sciatica, Seizure in childhood ([place]), and Vertigo.     has a past surgical history that includes Bunionectomy and Wrist surgery.     Social History     Socioeconomic History   ??? Marital status: Single     Spouse name: Not on file   ??? Number  of children: Not on file   ??? Years of education: Not on file   ??? Highest education level: Not on file   Occupational History   ??? Not on file   Social Needs   ??? Financial resource strain: Not on file   ??? Food insecurity     Worry: Not on file     Inability: Not on file   ??? Transportation needs     Medical: Not on file     Non-medical: Not on file   Tobacco Use   ??? Smoking status: Former Smoker   ??? Smokeless tobacco: Never Used   ??? Tobacco comment: pt quit when she learned she was preg    Substance and Sexual Activity   ??? Alcohol use: Not Currently   ??? Drug use: Yes     Frequency: 7.0 times per week     Types: Marijuana     Comment: pt last used approx 11/2017    ??? Sexual activity: Yes     Partners: Male   Lifestyle   ??? Physical activity     Days per week: Not on file     Minutes per session: Not on file   ??? Stress: Not on file   Relationships   ??? Social Product manager on phone: Not on file  Gets together: Not on file     Attends religious service: Not on file     Active member of club or organization: Not on file     Attends meetings of clubs or organizations: Not on file     Relationship status: Not on file   ??? Intimate partner violence     Fear of current or ex partner: Not on file     Emotionally abused: Not on file     Physically abused: Not on file     Forced sexual activity: Not on file   Other Topics Concern   ??? Not on file   Social History Narrative   ??? Not on file       Family History   Problem Relation Age of Onset   ??? Diabetes Mother    ??? Hypertension Mother    ??? Diabetes Father    ??? Hypertension Father    ??? Crohn's Disease Father    ??? Other Father    ??? Other Brother         Pulmonary Hypertension in one brother   ??? Diabetes Maternal Grandmother    ??? Other Maternal Grandmother    ??? Diabetes Paternal Grandmother    ??? Lupus Sister         2 sisters with lupus   1 with HTN and diabetes        Allergies:  Rocephin [ceftriaxone]; Penicillins; Lidocaine viscous hcl; and Zithromax [azithromycin]    Home  Medications:  Prior to Admission medications    Medication Sig Start Date End Date Taking? Authorizing Provider   diazePAM (VALIUM) 5 MG tablet Take 1 tablet by mouth every 8 hours as needed for Anxiety for up to 10 days. 10/06/18 10/16/18 Yes Kailo Kosik, DO   nitrofurantoin, macrocrystal-monohydrate, (MACROBID) 100 MG capsule Take 1 capsule by mouth 2 times daily for 5 days 10/02/18 10/07/18  Manley MasonKevin J Wickenheiser, MD   ferrous sulfate (FE TABS) 325 (65 Fe) MG EC tablet Take 1 tablet by mouth 2 times daily 07/23/18   Waylan Rocherelia Camardo Russo, DO   ibuprofen (ADVIL;MOTRIN) 800 MG tablet Take 1 tablet by mouth every 8 hours as needed for Pain 07/22/18   Christel MormonJessica M Van Kooten, DO   docusate sodium (COLACE, DULCOLAX) 100 MG CAPS Take 100 mg by mouth 2 times daily 07/22/18   Cathie HoopsJessica M Van Kooten, DO   aspirin EC 81 MG EC tablet Take 1 tablet by mouth daily 02/08/18   Alver FisherMorgan E Craig, DO   docusate sodium (COLACE) 100 MG capsule Take 100 mg by mouth 2 times daily as needed for Constipation    Historical Provider, MD       REVIEW OFSYSTEMS    (2-9 systems for level 4, 10 or more for level 5)      Review of Systems   Constitutional: Negative for chills and fever.   HENT: Negative for congestion and rhinorrhea.    Eyes: Negative for photophobia and visual disturbance.   Respiratory: Negative for shortness of breath and wheezing.    Cardiovascular: Negative for chest pain and palpitations.   Gastrointestinal: Negative for abdominal pain, nausea and vomiting.   Musculoskeletal: Positive for arthralgias.   Skin: Negative for rash and wound.   Neurological: Negative for dizziness, weakness, numbness and headaches.       PHYSICAL EXAM   (up to 7 for level 4, 8 or more forlevel 5)      INITIAL VITALS:   ED Triage Vitals  BP Temp Temp Source Pulse Resp SpO2 Height Weight   10/06/18 1142 10/06/18 1137 10/06/18 1137 10/06/18 1142 10/06/18 1142 10/06/18 1142 10/06/18 1142 10/06/18 1142   122/82 97.9 ??F (36.6 ??C) Oral 103 16 98 % 5\' 2"  (1.575 m)  193 lb (87.5 kg)       Physical Exam  Constitutional:       General: She is not in acute distress.     Appearance: She is well-developed.   HENT:      Head: Normocephalic and atraumatic.   Eyes:      Conjunctiva/sclera: Conjunctivae normal.   Neck:      Musculoskeletal: Normal range of motion and neck supple.   Cardiovascular:      Rate and Rhythm: Normal rate.      Pulses: Normal pulses.      Heart sounds: Normal heart sounds.   Pulmonary:      Effort: Pulmonary effort is normal.      Breath sounds: Normal breath sounds.   Abdominal:      Palpations: Abdomen is soft.      Tenderness: There is no abdominal tenderness.   Musculoskeletal:      Comments: Full active range of motion of bilateral shoulders however there is pain with doing so.  There is no focal bony tenderness however there is pain diffusely over the shoulder as well as bilateral trapezius muscles.  Patient has a negative empty can and speeds tests bilaterally.  She has full strength with resisted abduction.    Skin:     General: Skin is warm.      Capillary Refill: Capillary refill takes less than 2 seconds.      Comments: No warmth, erythema, fluctuance over bilateral shoulders   Neurological:      Mental Status: She is alert and oriented to person, place, and time. Mental status is at baseline.      Sensory: No sensory deficit.         DIFFERENTIAL  DIAGNOSIS     PLAN (LABS / IMAGING / EKG):  Orders Placed This Encounter   Procedures   ??? XR SHOULDER RIGHT (MIN 2 VIEWS)   ??? XR SHOULDER LEFT (MIN 2 VIEWS)       MEDICATIONS ORDERED:  Orders Placed This Encounter   Medications   ??? ibuprofen (ADVIL;MOTRIN) tablet 800 mg   ??? diazePAM (VALIUM) tablet 5 mg   ??? diazePAM (VALIUM) 5 MG tablet     Sig: Take 1 tablet by mouth every 8 hours as needed for Anxiety for up to 10 days.     Dispense:  5 tablet     Refill:  0       Initial MDM/Plan: 23 y.o. female who presents with bilateral shoulder pain.  No trauma.  She has been carrying a newborn at a 24-year-old at  home.  She is neurovascularly intact including intact sensation over bilateral deltoids.  She states he has a history of shoulder dislocation and was concerned that she might of dislocated her right shoulder however clinically, there is no dislocation.  Full range of motion.  Compartments are soft.  No signs or symptoms of infection including septic joint.  She has no focal bony tenderness and I have a very low suspicion for fracture.  However, given history of dislocations will obtain shoulder x-rays to evaluate.  Clinically, I suspect bilateral trapezius strain given increased tearing of children as well as tenderness along bilateral trapezius muscles.  Therefore, will treat with Valium  and ibuprofen given lack of improvement with Flexeril at home. Anticipate discharge.    DIAGNOSTIC RESULTS / EMERGENCYDEPARTMENT COURSE / MDM     LABS:  Labs Reviewed - No data to display      RADIOLOGY:  Xr Shoulder Right (min 2 Views)    Result Date: 10/06/2018  EXAMINATION: THREE XRAY VIEWS OF THE RIGHT SHOULDER; THREE XRAY VIEWS OF THE LEFT SHOULDER 10/06/2018 12:53 pm COMPARISON: None. HISTORY: ORDERING SYSTEM PROVIDED HISTORY: shoulder pain, hx shoulder dislocation TECHNOLOGIST PROVIDED HISTORY: shoulder pain, hx shoulder dislocation 23 year old female with bilateral shoulder pain FINDINGS: Right shoulder: Visualized ribs appear intact.  No acute fracture or dislocation.  Right AC and glenohumeral joints grossly unremarkable. Left shoulder: Visualized ribs appear intact.  No acute fracture or dislocation.  Left AC and glenohumeral joints grossly unremarkable.     Right shoulder: No acute fracture or dislocation. Left shoulder: No acute fracture or dislocation.     Xr Shoulder Left (min 2 Views)    Result Date: 10/06/2018  EXAMINATION: THREE XRAY VIEWS OF THE RIGHT SHOULDER; THREE XRAY VIEWS OF THE LEFT SHOULDER 10/06/2018 12:53 pm COMPARISON: None. HISTORY: ORDERING SYSTEM PROVIDED HISTORY: shoulder pain, hx shoulder dislocation  TECHNOLOGIST PROVIDED HISTORY: shoulder pain, hx shoulder dislocation 23 year old female with bilateral shoulder pain FINDINGS: Right shoulder: Visualized ribs appear intact.  No acute fracture or dislocation.  Right AC and glenohumeral joints grossly unremarkable. Left shoulder: Visualized ribs appear intact.  No acute fracture or dislocation.  Left AC and glenohumeral joints grossly unremarkable.     Right shoulder: No acute fracture or dislocation. Left shoulder: No acute fracture or dislocation.         EKG  none    All EKG's are interpreted by the Emergency Department Physicianwho either signs or Co-signs this chart in the absence of a cardiologist.    EMERGENCY DEPARTMENT COURSE:  ED Course as of Oct 05 1313   Thu Oct 06, 2018   1314 Negative shoulder x-rays.  No fracture dislocation.  Impression is likely bilateral trapezius strain seriously Valium upon discharge.  Ibuprofen and Tylenol recommendation upon discharge which she has at home.  Rest.  Follow-up with PCP.  Patient ready for discharge.    [KD]      ED Course User Index  [KD] Neomia Glass, DO          PROCEDURES:  None    CONSULTS:  None    CRITICAL CARE:  Please see attending note    FINAL IMPRESSION      1. Strain of trapezius muscle, unspecified laterality, initial encounter          DISPOSITION / PLAN     DISPOSITION  [place]      PATIENT REFERRED TO:  Wilmer Floor, MD  [address]  [place] [address]  [phone]    Schedule an appointment as soon as possible for a visit       [facility]  [address]  [place] [address]  [phone]    As needed      DISCHARGE MEDICATIONS:  New Prescriptions    DIAZEPAM (VALIUM) 5 MG TABLET    Take 1 tablet by mouth every 8 hours as needed for Anxiety for up to 10 days.       Neomia Glass, DO  Emergency Medicine Resident    (Please note that portions of this note were completed with a voice recognition program.Efforts were made to edit the dictations  but  occasionally words are mis-transcribed.)       Neomia GlassKristin Bethan Adamek, DO  Resident  10/06/18 1315

## 2018-10-07 NOTE — Care Coordination-Inpatient (Signed)
Patient contacted regarding recent discharge and COVID-19 risk. Discussed COVID-19 related testing which was not done at this time. Test results were not done. Patient informed of results, if available? NA     Care Transition Nurse/ Ambulatory Care Manager contacted the patient by telephone to perform post discharge assessment. Verified name and DOB with patient as identifiers.     Patient has following risk factors of: no known risk factors. CTN/ACM reviewed discharge instructions, medical action plan and red flags related to discharge diagnosis. Reviewed and educated them on any new and changed medications related to discharge diagnosis.  Advised obtaining a 90-day supply of all daily and as-needed medications.     Education provided regarding infection prevention, and signs and symptoms of COVID-19 and when to seek medical attention with patient who verbalized understanding. Discussed exposure protocols and quarantine from [facility] Guidelines ???Are you at higher risk for severe illness 2019??? and given an opportunity for questions and concerns. The patient agrees to contact the COVID-19 hotline [phone] or PCP office for questions related to their healthcare. CTN/ACM provided contact information for future reference.    From CDC: Are you at higher risk for severe illness?    ??? Wash your hands often.  ??? Avoid close contact (6 feet, which is about two arm lengths) with people who are sick.  ??? Put distance between yourself and other people if COVID-19 is spreading in your community.  ??? Clean and disinfect frequently touched surfaces.  ??? Avoid all cruise travel and non-essential air travel.  ??? Call your healthcare professional if you have concerns about COVID-19 and your underlying condition or if you are sick.    For more information on steps you can take to protect yourself, see CDC's How to [place] for follow-up call in 7-14 days based on severity of symptoms and risk factors.    ED visit for strain  trapezius muscle. Second ED visit this week. She is feeling ok. Has a PCP appt scheduled 10/17/18. Strongly emphasized need to notify PCP office of concerns before going to ED, risk of COVID exposure with ED visits. She voiced understanding.

## 2018-10-21 NOTE — Care Coordination-Inpatient (Signed)
Left VM message on patient's phone asking she call me back at [phone] for 2 week ED follow up/COVID outreach. This is final call.

## 2018-10-22 ENCOUNTER — Emergency Department: Payer: Medicaid - Out of State

## 2018-10-22 ENCOUNTER — Emergency Department
Admission: EM | Admit: 2018-10-22 | Discharge: 2018-10-22 | Disposition: A | Discharge status: Home / Self Care | Payer: Medicaid - Out of State | Attending: Student | Admitting: Student

## 2018-10-22 ENCOUNTER — Other Ambulatory Visit: Payer: Self-pay

## 2018-10-22 DIAGNOSIS — R197 Diarrhea, unspecified: Secondary | ICD-10-CM | POA: Insufficient documentation

## 2018-10-22 DIAGNOSIS — R1031 Right lower quadrant pain: Secondary | ICD-10-CM

## 2018-10-22 DIAGNOSIS — R1030 Lower abdominal pain, unspecified: Secondary | ICD-10-CM | POA: Insufficient documentation

## 2018-10-22 DIAGNOSIS — N939 Abnormal uterine and vaginal bleeding, unspecified: Secondary | ICD-10-CM | POA: Diagnosis not present

## 2018-10-22 DIAGNOSIS — J45909 Unspecified asthma, uncomplicated: Secondary | ICD-10-CM | POA: Insufficient documentation

## 2018-10-22 DIAGNOSIS — Z87891 Personal history of nicotine dependence: Secondary | ICD-10-CM | POA: Insufficient documentation

## 2018-10-22 DIAGNOSIS — R1013 Epigastric pain: Secondary | ICD-10-CM | POA: Insufficient documentation

## 2018-10-22 DIAGNOSIS — R1032 Left lower quadrant pain: Secondary | ICD-10-CM

## 2018-10-22 DIAGNOSIS — R079 Chest pain, unspecified: Secondary | ICD-10-CM | POA: Diagnosis not present

## 2018-10-22 LAB — URINALYSIS, COMPLETE (UACMP) WITH MICROSCOPIC
Bacteria, UA: NONE SEEN
Bilirubin Urine: NEGATIVE
Glucose, UA: NEGATIVE mg/dL
Ketones, ur: NEGATIVE mg/dL
Leukocytes,Ua: NEGATIVE
Nitrite: NEGATIVE
Protein, ur: NEGATIVE mg/dL
RBC / HPF: >50 RBC/hpf — ABNORMAL HIGH (ref 0–5)
Specific Gravity, Urine: 1.012 (ref 1.005–1.030)
pH: 6 (ref 5.0–8.0)

## 2018-10-22 LAB — BASIC METABOLIC PANEL
Anion gap: 8 (ref 5–15)
BUN: 5 mg/dL — ABNORMAL LOW (ref 6–20)
CO2: 21 mmol/L — ABNORMAL LOW (ref 22–32)
Calcium: 9 mg/dL (ref 8.9–10.3)
Chloride: 109 mmol/L (ref 98–111)
Creatinine, Ser: 0.75 mg/dL (ref 0.44–1.00)
GFR calc Af Amer: >60 mL/min (ref >60)
GFR calc non Af Amer: >60 mL/min (ref >60)
Glucose, Bld: 98 mg/dL (ref 70–99)
Potassium: 3.7 mmol/L (ref 3.5–5.1)
Sodium: 138 mmol/L (ref 135–145)

## 2018-10-22 LAB — CBC
HCT: 40.9 % (ref 36.0–46.0)
Hemoglobin: 13 g/dL (ref 12.0–15.0)
MCH: 29 pg (ref 26.0–34.0)
MCHC: 31.8 g/dL (ref 30.0–36.0)
MCV: 91.3 fL (ref 80.0–100.0)
Platelets: 241 10*3/uL (ref 150–400)
RBC: 4.48 MIL/uL (ref 3.87–5.11)
RDW: 16.9 % — ABNORMAL HIGH (ref 11.5–15.5)
WBC: 5.2 10*3/uL (ref 4.0–10.5)
nRBC: 0 % (ref 0.0–0.2)

## 2018-10-22 LAB — C DIFFICILE QUICK SCREEN W PCR REFLEX
C Diff antigen: NEGATIVE
C Diff interpretation: NOT DETECTED
C Diff toxin: NEGATIVE

## 2018-10-22 LAB — TROPONIN I (HIGH SENSITIVITY): Troponin I (High Sensitivity): <2 ng/L (ref <18)

## 2018-10-22 LAB — LIPASE, BLOOD: Lipase: 36 U/L (ref 11–51)

## 2018-10-22 LAB — POCT PREGNANCY, URINE: Preg Test, Ur: NEGATIVE

## 2018-10-22 MED ORDER — DICYCLOMINE HCL 20 MG PO TABS: 20.0000 mg | ORAL_TABLET | Freq: Three times a day (TID) | ORAL | 0 refills | Status: DC | PRN

## 2018-10-22 MED ORDER — SODIUM CHLORIDE 0.9% FLUSH: 3.0000 mL | Freq: Once | INTRAVENOUS | Status: DC

## 2018-10-22 MED ORDER — FAMOTIDINE 20 MG PO TABS: 20.0000 mg | ORAL_TABLET | Freq: Two times a day (BID) | ORAL | 0 refills | Status: DC

## 2018-10-22 NOTE — Discharge Instructions (Signed)
Thank you for letting us take care of you in the emergency department today.   Please continue to take your regular, prescribed medications.  We will send you home with 2 prescriptions today as discussed, please take as directed.  Please follow up with your primary care doctor to review your ER visit and follow up on your symptoms.   Please return to the ER for any new or worsening symptoms.

## 2018-10-22 NOTE — ED Notes (Signed)
First Nurse Note: Pt to ED c/o central chest pain and radiates into her left chest and lower abdominal pain x 2 week. Pt noted to be drinking a large soda. Pt was advised not to have anything else to eat or drink prior to seeing MD.

## 2018-10-22 NOTE — ED Triage Notes (Signed)
Pt presents via POV c/o chest pain and abd pain x2 weeks. Reports constant. Reports having vaginal spotting x2 weeks as well.

## 2018-10-22 NOTE — ED Notes (Signed)
Pt verbalized understanding of discharge instructions. NAD at this time. 

## 2018-10-22 NOTE — ED Provider Notes (Signed)
[facility] Emergency Department Provider Note  ____________________________________________   First MD Initiated Contact with Patient 10/22/18 [phone]     (approximate)  I have reviewed the triage vital signs and the nursing notes.  History  Chief Complaint Chest Pain and Abdominal Pain    HPI Kristy Reid is a 23 y.o. female with a history of anemia, bipolar 1 disorder, PTSD who presents emergency department for 2 weeks of lower abdominal pain and epigastric abdominal pain.  She states for the last 2 weeks she has had constant epigastric pain, unchanged with eating, no aggravating or alleviating factors.  There is no associated shortness of breath, cough, fevers, lightheadedness, or passing out.  She also reports lower abdominal pain which she describes as cramping.  Moderate in severity.  Associated with loose stools, which is unusual for her.  No bloody stools, no frankly watery stools.  She does note she recently finished a prescription for Bactrim for a UTI, but denies any other medication changes.  She denies any hematuria, dysuria, or malodorous urine.  She does report some vaginal spotting, but states that she is due for her period today.  She denies any vaginal discharge.         Past Medical Hx Past Medical History:  Diagnosis Date  . Anemia   . Anemia   . Anxiety   . Asthma   . Bipolar 1 disorder ([place])   . Chronic back pain   . Depression   . Insomnia   . Personality disorder ([place])    "boarderline"  . PTSD (post-traumatic stress disorder)     Problem List Patient Active Problem List   Diagnosis Date Noted  . Preterm contractions 06/03/2018  . Anemia affecting pregnancy in third trimester 05/30/2018  . Limited prenatal care in second trimester 05/19/2018  . Depression with suicidal ideation 05/04/2018  . Alcohol abuse 05/04/2018  . Schizoaffective disorder, bipolar type ([place]) 05/04/2018  . Anxiety disorder   . Bipolar 2 disorder  ([place]) 11/11/2016  . Noncompliance 11/11/2016  . Severe recurrent major depression with psychotic features ([place]) 07/18/2016  . MDD (major depressive disorder), recurrent, severe, with psychosis ([place]) 07/17/2016  . Decreased fetal movement 03/22/2016  . Abdominal pain in pregnancy 01/21/2016  . Constipation during pregnancy in second trimester 12/26/2015  . Overdose 11/29/2015  . Supervision of high risk pregnancy, antepartum 11/15/2015  . Cannabis use disorder, severe, dependence ([place]) 11/15/2015  . Tobacco use disorder 11/15/2015  . Asthma 11/15/2015  . Borderline personality disorder ([place]) 11/15/2015  . PTSD (post-traumatic stress disorder) 11/15/2015  . Severe recurrent major depression without psychotic features ([place]) 11/13/2015    Past Surgical Hx Past Surgical History:  Procedure Laterality Date  . BUNIONECTOMY Bilateral    feet  . wrist sugery      Medications Prior to Admission medications   Medication Sig Start Date End Date Taking? Authorizing Provider  albuterol (PROVENTIL HFA;VENTOLIN HFA) 108 (90 Base) MCG/ACT inhaler Inhale 2 puffs into the lungs every 6 (six) hours as needed for wheezing or shortness of breath.     [provider]  butalbital-acetaminophen-caffeine (FIORICET, ESGIC) 50-325-40 MG tablet Take 1-2 tablets by mouth every 6 (six) hours as needed for headache. Patient not taking: Reported on 06/30/2018 05/23/18 05/23/19  Rexene Agent, CNM  calcium carbonate (TUMS - DOSED IN MG ELEMENTAL CALCIUM) 500 MG chewable tablet Chew 2 tablets by mouth daily as needed for indigestion or heartburn.    [provider]  citalopram (  CELEXA) 10 MG tablet Take 1 tablet (10 mg total) by mouth daily. Patient not taking: Reported on 06/30/2018 05/06/18   [name], Jackquline DenmarkJohn T, MD  cyclobenzaprine (FLEXERIL) 10 MG tablet Take 1 tablet (10 mg total) by mouth 3 (three) times daily as needed. Patient not taking: Reported on 06/30/2018 05/05/18   [name], Jackquline DenmarkJohn T, MD   docusate sodium (COLACE) 100 MG capsule Take 2 capsules (200 mg total) by mouth 2 (two) times daily. Patient not taking: Reported on 06/30/2018 05/05/18   [name], Jackquline DenmarkJohn T, MD  famotidine (PEPCID) 20 MG tablet Take 1 tablet (20 mg total) by mouth 2 (two) times daily. Patient not taking: Reported on 06/30/2018 05/05/18   [name], Jackquline DenmarkJohn T, MD  ferrous sulfate 325 (65 FE) MG tablet Take 1 tablet (325 mg total) by mouth daily with breakfast. Patient not taking: Reported on 06/30/2018 05/05/18   [name], Jackquline DenmarkJohn T, MD  fluticasone (FLONASE) 50 MCG/ACT nasal spray Place 1 spray into both nostrils 2 (two) times daily. Patient not taking: Reported on 06/30/2018 05/05/18   [name], Jackquline DenmarkJohn T, MD  folic acid (FOLVITE) 1 MG tablet Take 1 tablet (1 mg total) by mouth daily. Patient not taking: Reported on 06/30/2018 05/05/18   [name], Jackquline DenmarkJohn T, MD  loratadine (CLARITIN) 10 MG tablet Take 1 tablet (10 mg total) by mouth daily. Patient not taking: Reported on 06/30/2018 05/05/18   [name], Jackquline DenmarkJohn T, MD  Prenat-Fe Poly-Methfol-FA-DHA (VITAFOL FE+) 90-0.6-0.4-200 MG CAPS Take 1 tablet by mouth daily. Patient not taking: Reported on 06/04/2018 06/02/18   Natale MilchSchuman, Christanna R, MD  promethazine (PHENERGAN) 25 MG tablet Take 1 tablet (25 mg total) by mouth every 6 (six) hours as needed for nausea or vomiting. Patient not taking: Reported on 06/30/2018 05/05/18   [name], Jackquline DenmarkJohn T, MD  traZODone (DESYREL) 150 MG tablet Take 1 tablet (150 mg total) by mouth at bedtime. Patient not taking: Reported on 06/30/2018 05/05/18   [name], Jackquline DenmarkJohn T, MD    Allergies Penicillins, Rocephin [ceftriaxone], Zithromax [azithromycin], and Lidocaine  Family Hx Family History  Problem Relation Age of Onset  . Diabetes Mother   . Hypertension Mother   . Mental illness Mother   . Diabetes Maternal Grandmother   . Heart disease Maternal Grandmother   . Cancer Neg Hx   . Ovarian cancer Neg Hx     Social Hx Social History   Tobacco Use  . Smoking  status: Former Smoker    Types: Cigarettes    Quit date: 08/13/2015    Years since quitting: 3.1  . Smokeless tobacco: Never Used  Substance Use Topics  . Alcohol use: No  . Drug use: Yes    Frequency: 7.0 times per week    Types: Marijuana    Comment: daily use, last use yesterday     Review of Systems  Constitutional: Negative for fever. Negative for chills. Eyes: Negative for visual changes. ENT: Negative for sore throat. Cardiovascular: Negative for chest pain. Respiratory: Negative for shortness of breath. Gastrointestinal: Positive for abdominal pain.  Positive for diarrhea. Genitourinary: Negative for dysuria. Musculoskeletal: Negative for leg swelling. Skin: Negative for rash. Neurological: Negative for for headaches.   Physical Exam  Vital Signs: ED Triage Vitals  Enc Vitals Group     BP 10/22/18 0847 118/73     Pulse Rate 10/22/18 0847 91     Resp 10/22/18 0847 14     Temp 10/22/18 0847 98.7 F (37.1 C)     Temp Source 10/22/18 0847 Oral  SpO2 10/22/18 0847 100 %     Weight 10/22/18 0848 198 lb (89.8 kg)     Height --      Head Circumference --      Peak Flow --      Pain Score 10/22/18 0846 8     Pain Loc --      Pain Edu? --      Excl. in [place]? --     Constitutional: Alert and oriented.  Eyes: Conjunctivae clear. Sclera anicteric. Head: Normocephalic. Atraumatic. Nose: No congestion. No rhinorrhea. Mouth/Throat: Mucous membranes are moist.  Neck: No stridor.   Cardiovascular: Normal rate, regular rhythm. No murmurs. Extremities well perfused. Respiratory: Normal respiratory effort.  Lungs CTAB. Gastrointestinal: Soft and mild lower abdominal tenderness, no rebound or guarding.  No focal localizing tenderness.  No distention.  Musculoskeletal: No lower extremity edema. Neurologic:  Normal speech and language. No gross focal neurologic deficits are appreciated.  Skin: Skin is warm, dry and intact. No rash noted. Psychiatric: Mood and affect are  appropriate for situation.  EKG  Personally reviewed.   Rate: 84 Rhythm: Sinus Axis: borderline R axis, unchanged from prior Intervals: WNL No acute ischemic changes.    Radiology  XR: no consolidations  Procedures  Procedure(s) performed (including critical care):  Procedures   Initial Impression / Assessment and Plan / ED Course  23 y.o. female with history of anemia, bipolar disorder who presents to the ED for epigastric, and lower abdominal pain for the last 2 weeks.  On exam she is well appearing. MM moist. LCTAB. Mild lower discomfort w/ palpation but no rebound/guarding or focal/localizing pain.   Ddx includes gastritis, gastroenteritis, pancreatitis, IBS. Consider C diff, given recent abx, but very low suspicion based on description of stools.   No fevers, RLQ pain, anorexia s/o appendicitis. No vaginal discharge s/o PID. No preceding illness s/o pericarditis. No SOB, hypoxia, travel to put at risk for PE.   Will obtain basic labs, EKG, chest x-ray.  Work up without actionable derangements.  EKG unchanged from prior, negative troponin, negative lipase, urine pregnancy negative.  Stool sent for C. difficile, though based on appearance, low suspicion.  We will plan for discharge with symptomatic treatment with famotidine and Bentyl.  Advise close PCP follow-up, patient is agreeable.  She understands she will receive a call if her CT scan is positive.  Discussed return precautions.  Patient comfortable with plan and discharge.    Final Clinical Impression(s) / ED Diagnosis  Final diagnoses:  Epigastric pain  Bilateral lower abdominal cramping       Note:  This document was prepared using Dragon voice recognition software and may include unintentional dictation errors.   Miguel AschoffMonks, Sarah L., MD 10/22/18 [phone]

## 2018-11-20 ENCOUNTER — Encounter: Payer: Self-pay | Admitting: Emergency Medicine

## 2018-11-20 ENCOUNTER — Emergency Department
Admission: EM | Admit: 2018-11-20 | Discharge: 2018-11-20 | Disposition: A | Discharge status: Home / Self Care | Payer: Medicaid - Out of State | Attending: Emergency Medicine | Admitting: Emergency Medicine

## 2018-11-20 ENCOUNTER — Emergency Department: Payer: Medicaid - Out of State

## 2018-11-20 ENCOUNTER — Other Ambulatory Visit: Payer: Self-pay

## 2018-11-20 DIAGNOSIS — J45909 Unspecified asthma, uncomplicated: Secondary | ICD-10-CM | POA: Diagnosis not present

## 2018-11-20 DIAGNOSIS — Z20828 Contact with and (suspected) exposure to other viral communicable diseases: Secondary | ICD-10-CM | POA: Insufficient documentation

## 2018-11-20 DIAGNOSIS — N921 Excessive and frequent menstruation with irregular cycle: Secondary | ICD-10-CM | POA: Diagnosis not present

## 2018-11-20 DIAGNOSIS — R1030 Lower abdominal pain, unspecified: Secondary | ICD-10-CM | POA: Insufficient documentation

## 2018-11-20 DIAGNOSIS — F1210 Cannabis abuse, uncomplicated: Secondary | ICD-10-CM | POA: Diagnosis not present

## 2018-11-20 DIAGNOSIS — Z87891 Personal history of nicotine dependence: Secondary | ICD-10-CM | POA: Diagnosis not present

## 2018-11-20 DIAGNOSIS — R252 Cramp and spasm: Secondary | ICD-10-CM | POA: Diagnosis present

## 2018-11-20 DIAGNOSIS — R102 Pelvic and perineal pain: Secondary | ICD-10-CM

## 2018-11-20 DIAGNOSIS — N946 Dysmenorrhea, unspecified: Secondary | ICD-10-CM | POA: Diagnosis not present

## 2018-11-20 LAB — POCT PREGNANCY, URINE: Preg Test, Ur: NEGATIVE

## 2018-11-20 LAB — CBC
HCT: 41.7 % (ref 36.0–46.0)
Hemoglobin: 13.5 g/dL (ref 12.0–15.0)
MCH: 29.4 pg (ref 26.0–34.0)
MCHC: 32.4 g/dL (ref 30.0–36.0)
MCV: 90.8 fL (ref 80.0–100.0)
Platelets: 225 10*3/uL (ref 150–400)
RBC: 4.59 MIL/uL (ref 3.87–5.11)
RDW: 13 % (ref 11.5–15.5)
WBC: 4.4 10*3/uL (ref 4.0–10.5)
nRBC: 0 % (ref 0.0–0.2)

## 2018-11-20 LAB — URINALYSIS, COMPLETE (UACMP) WITH MICROSCOPIC
Bacteria, UA: NONE SEEN
Bilirubin Urine: NEGATIVE
Glucose, UA: NEGATIVE mg/dL
Ketones, ur: NEGATIVE mg/dL
Nitrite: NEGATIVE
Protein, ur: 100 mg/dL — AB
RBC / HPF: >50 RBC/hpf — ABNORMAL HIGH (ref 0–5)
Specific Gravity, Urine: 1.031 — ABNORMAL HIGH (ref 1.005–1.030)
pH: 5 (ref 5.0–8.0)

## 2018-11-20 LAB — COMPREHENSIVE METABOLIC PANEL
ALT: 21 U/L (ref 0–44)
AST: 20 U/L (ref 15–41)
Albumin: 4.2 g/dL (ref 3.5–5.0)
Alkaline Phosphatase: 78 U/L (ref 38–126)
Anion gap: 10 (ref 5–15)
BUN: 8 mg/dL (ref 6–20)
CO2: 22 mmol/L (ref 22–32)
Calcium: 9.6 mg/dL (ref 8.9–10.3)
Chloride: 107 mmol/L (ref 98–111)
Creatinine, Ser: 0.88 mg/dL (ref 0.44–1.00)
GFR calc Af Amer: >60 mL/min (ref >60)
GFR calc non Af Amer: >60 mL/min (ref >60)
Glucose, Bld: 94 mg/dL (ref 70–99)
Potassium: 3.8 mmol/L (ref 3.5–5.1)
Sodium: 139 mmol/L (ref 135–145)
Total Bilirubin: 0.7 mg/dL (ref 0.3–1.2)
Total Protein: 7.5 g/dL (ref 6.5–8.1)

## 2018-11-20 MED ORDER — ONDANSETRON 4 MG PO TBDP
4.0000 mg | ORAL_TABLET | Freq: Once | ORAL | Status: AC
  Administered 2018-11-20: 4 mg via ORAL
  Filled 2018-11-20: qty 1

## 2018-11-20 MED ORDER — CYCLOBENZAPRINE HCL 10 MG PO TABS: 10.0000 mg | ORAL_TABLET | Freq: Three times a day (TID) | ORAL | 0 refills | Status: DC | PRN

## 2018-11-20 MED ORDER — IBUPROFEN 600 MG PO TABS
600.0000 mg | ORAL_TABLET | ORAL | Status: AC
  Administered 2018-11-20: 600 mg via ORAL
  Filled 2018-11-20: qty 1

## 2018-11-20 NOTE — ED Triage Notes (Addendum)
Pt c/o irregular vaginal bleeding for several months as well as abdominal cramping.  Pt reports changing pad or tampon about every hour. Pt reports passing clots.  Pt has not been seen by OB-GYN.

## 2018-11-20 NOTE — ED Notes (Signed)
Pt states vaginal bleeding started months ago and comes and goes intermittently. Increase in pad saturation, numerous clots of varying sizes. Pt states pelvic pain is 7/10. Pt states she came in due to pain that awakens her from sleep.

## 2018-11-20 NOTE — ED Notes (Signed)
Pt returned from US

## 2018-11-20 NOTE — ED Notes (Signed)
Pt ambulatory to treatment room. No distress noted. Provided with warm blanket.

## 2018-11-20 NOTE — ED Provider Notes (Signed)
[facility] Emergency Department Provider Note   ____________________________________________   First MD Initiated Contact with Patient 11/20/18 1213     (approximate)  I have reviewed the triage vital signs and the nursing notes.   HISTORY  Chief Complaint Vaginal Bleeding and Abdominal Cramping    HPI Kristy Reid is a 23 y.o. female here for evaluation for abdominal cramps and lower abdominal pain with abnormal menstrual cycles   Patient reports she had a baby 4 months ago.  Since then her last several menstrual cycles have been a little more severe in intensity, and she will have a cycle about every month, but then a few days after her period subsides she may have a couple days of bleeding.  She reports that not heavy.  At its worst she has had a change about 1 tampon an hour, but it does not last very long and improves rather quickly but she will get crampy pain only mildly relieved by ibuprofen.  Reports she had a normal 5 to 6-day menstrual cycle about a week ago, then bleeding went away and it seemed to come back again with lower abdominal crampiness over the last day.  She reports this cycle has occurred the last 3 months, and she talked to her grandmother who felt that it would be good for her to get an ultrasound and make sure she did not have like a new cyst or something after having a baby  Past Medical History:  Diagnosis Date   Anemia    Anemia    Anxiety    Asthma    Bipolar 1 disorder (HCC)    Chronic back pain    Depression    Insomnia    Personality disorder (HCC)    "boarderline"   PTSD (post-traumatic stress disorder)     Patient Active Problem List   Diagnosis Date Noted   Preterm contractions 06/03/2018   Anemia affecting pregnancy in third trimester 05/30/2018   Limited prenatal care in second trimester 05/19/2018   Depression with suicidal ideation 05/04/2018   Alcohol abuse 05/04/2018    Schizoaffective disorder, bipolar type (HCC) 05/04/2018   Anxiety disorder    Bipolar 2 disorder (HCC) 11/11/2016   Noncompliance 11/11/2016   Severe recurrent major depression with psychotic features (HCC) 07/18/2016   MDD (major depressive disorder), recurrent, severe, with psychosis (HCC) 07/17/2016   Decreased fetal movement 03/22/2016   Abdominal pain in pregnancy 01/21/2016   Constipation during pregnancy in second trimester 12/26/2015   Overdose 11/29/2015   Supervision of high risk pregnancy, antepartum 11/15/2015   Cannabis use disorder, severe, dependence (HCC) 11/15/2015   Tobacco use disorder 11/15/2015   Asthma 11/15/2015   Borderline personality disorder (HCC) 11/15/2015   PTSD (post-traumatic stress disorder) 11/15/2015   Severe recurrent major depression without psychotic features (HCC) 11/13/2015    Past Surgical History:  Procedure Laterality Date   BUNIONECTOMY Bilateral    feet   wrist sugery      Prior to Admission medications   Medication Sig Start Date End Date Taking? Authorizing Provider  butalbital-acetaminophen-caffeine (FIORICET, ESGIC) 50-325-40 MG tablet Take 1-2 tablets by mouth every 6 (six) hours as needed for headache. Patient not taking: Reported on 06/30/2018 05/23/18 05/23/19  Oswaldo ConroySchmid, Jacelyn Y, CNM  citalopram (CELEXA) 10 MG tablet Take 1 tablet (10 mg total) by mouth daily. Patient not taking: Reported on 06/30/2018 05/06/18   [name], Jackquline DenmarkJohn T, MD  cyclobenzaprine (FLEXERIL) 10 MG tablet Take 1 tablet (10 mg  total) by mouth 3 (three) times daily as needed. Patient not taking: Reported on 06/30/2018 05/05/18   [name], Jackquline DenmarkJohn T, MD  dicyclomine (BENTYL) 20 MG tablet Take 1 tablet (20 mg total) by mouth 3 (three) times daily as needed for spasms. 10/22/18 10/22/19  Miguel AschoffMonks, Sarah L., MD  docusate sodium (COLACE) 100 MG capsule Take 2 capsules (200 mg total) by mouth 2 (two) times daily. Patient not taking: Reported on 06/30/2018 05/05/18    [name], Jackquline DenmarkJohn T, MD  famotidine (PEPCID) 20 MG tablet Take 1 tablet (20 mg total) by mouth 2 (two) times daily. Patient not taking: Reported on 06/30/2018 05/05/18   [name], Jackquline DenmarkJohn T, MD  famotidine (PEPCID) 20 MG tablet Take 1 tablet (20 mg total) by mouth 2 (two) times daily. 10/22/18 11/21/18  Miguel AschoffMonks, Sarah L., MD  ferrous sulfate 325 (65 FE) MG tablet Take 1 tablet (325 mg total) by mouth daily with breakfast. Patient not taking: Reported on 06/30/2018 05/05/18   [name], Jackquline DenmarkJohn T, MD  fluticasone (FLONASE) 50 MCG/ACT nasal spray Place 1 spray into both nostrils 2 (two) times daily. Patient not taking: Reported on 06/30/2018 05/05/18   [name], Jackquline DenmarkJohn T, MD  folic acid (FOLVITE) 1 MG tablet Take 1 tablet (1 mg total) by mouth daily. Patient not taking: Reported on 06/30/2018 05/05/18   [name], Jackquline DenmarkJohn T, MD  loratadine (CLARITIN) 10 MG tablet Take 1 tablet (10 mg total) by mouth daily. Patient not taking: Reported on 06/30/2018 05/05/18   [name], Jackquline DenmarkJohn T, MD  Prenat-Fe Poly-Methfol-FA-DHA (VITAFOL FE+) 90-0.6-0.4-200 MG CAPS Take 1 tablet by mouth daily. Patient not taking: Reported on 06/04/2018 06/02/18   Natale MilchSchuman, Christanna R, MD  promethazine (PHENERGAN) 25 MG tablet Take 1 tablet (25 mg total) by mouth every 6 (six) hours as needed for nausea or vomiting. Patient not taking: Reported on 06/30/2018 05/05/18   [name], Jackquline DenmarkJohn T, MD  traZODone (DESYREL) 150 MG tablet Take 1 tablet (150 mg total) by mouth at bedtime. Patient not taking: Reported on 06/30/2018 05/05/18   [name], Jackquline DenmarkJohn T, MD    Allergies Penicillins, Rocephin [ceftriaxone], Zithromax [azithromycin], and Lidocaine  Family History  Problem Relation Age of Onset   Diabetes Mother    Hypertension Mother    Mental illness Mother    Diabetes Maternal Grandmother    Heart disease Maternal Grandmother    Cancer Neg Hx    Ovarian cancer Neg Hx     Social History Social History   Tobacco Use   Smoking status: Former Smoker    Types:  Cigarettes    Quit date: 08/13/2015    Years since quitting: 3.2   Smokeless tobacco: Never Used  Substance Use Topics   Alcohol use: No   Drug use: Yes    Frequency: 7.0 times per week    Types: Marijuana    Comment: daily use, last use yesterday    Review of Systems Constitutional: No fever/chills ENT: No sore throat. Cardiovascular: Denies chest pain. Respiratory: Denies shortness of breath. Gastrointestinal: Denies abdominal pain, reports rather she will get severe crampy discomfort. Genitourinary: Negative for dysuria. Musculoskeletal: Negative for back pain. Skin: Negative for rash. Neurological: Negative for headaches, areas of focal weakness or numbness.    ____________________________________________   PHYSICAL EXAM:  VITAL SIGNS: ED Triage Vitals  Enc Vitals Group     BP 11/20/18 1018 121/71     Pulse Rate 11/20/18 1018 99     Resp 11/20/18 1018 17     Temp 11/20/18 1018 98.7  F (37.1 C)     Temp Source 11/20/18 1018 Oral     SpO2 11/20/18 1018 97 %     Weight 11/20/18 1003 196 lb (88.9 kg)     Height 11/20/18 1003 5\' 2"  (1.575 m)     Head Circumference --      Peak Flow --      Pain Score 11/20/18 1003 7     Pain Loc --      Pain Edu? --      Excl. in [place]? --     Constitutional: Alert and oriented. Well appearing and in no acute distress. Eyes: Conjunctivae are normal. Head: Atraumatic. Nose: No congestion/rhinnorhea. Mouth/Throat: Mucous membranes are moist. Neck: No stridor.  Cardiovascular: Normal rate, regular rhythm. Grossly normal heart sounds.  Good peripheral circulation. Respiratory: Normal respiratory effort.  No retractions. Lungs CTAB. Gastrointestinal: Soft and nontender except some mild discomfort suprapubically. No distention.  Presently nontender in all quadrants with exception to mild discomfort suprapubically.  No rebound or guarding Musculoskeletal: No lower extremity tenderness nor edema. Neurologic:  Normal speech and  language. No gross focal neurologic deficits are appreciated.  Skin:  Skin is warm, dry and intact. No rash noted. Psychiatric: Mood and affect are normal. Speech and behavior are normal.  ____________________________________________   LABS (all labs ordered are listed, but only abnormal results are displayed)  Labs Reviewed  URINALYSIS, COMPLETE (UACMP) WITH MICROSCOPIC - Abnormal; Notable for the following components:      Result Value   Color, Urine AMBER (*)    APPearance CLOUDY (*)    Specific Gravity, Urine 1.031 (*)    Hgb urine dipstick LARGE (*)    Protein, ur 100 (*)    Leukocytes,Ua TRACE (*)    RBC / HPF >50 (*)    All other components within normal limits  URINE CULTURE  SARS CORONAVIRUS 2 (TAT 6-24 HRS)  COMPREHENSIVE METABOLIC PANEL  CBC  POC URINE PREG, ED  POCT PREGNANCY, URINE   ____________________________________________  EKG   ____________________________________________  RADIOLOGY  US Renal  Result Date: 11/20/2018 CLINICAL DATA:  Lower abdominal pain for 3 months, possible hematuria EXAM: RENAL / URINARY TRACT ULTRASOUND COMPLETE COMPARISON:  CT renal stone 08/12/2016 FINDINGS: Right Kidney: Renal measurements: 9.6 x 4.1 x 3.7 cm = volume: 75 mL . Echogenicity within normal limits. No mass or hydronephrosis visualized. Left Kidney: Renal measurements: 10.3 x 4.5 x 3.6 cm = volume: 86 mL. Echogenicity within normal limits. No mass or hydronephrosis visualized. Bladder: Bladder appears diffusely thick walled likely due to underdistention. IMPRESSION: No sonographic evidence of renal calculi or hydronephrosis. Electronically Signed   By: Audie Pinto M.D.   On: 11/20/2018 13:54   US Pelvic Complete W Transvaginal And Torsion R/o  Result Date: 11/20/2018 CLINICAL DATA:  Pelvic pain. EXAM: TRANSABDOMINAL AND TRANSVAGINAL ULTRASOUND OF PELVIS DOPPLER ULTRASOUND OF OVARIES TECHNIQUE: Both transabdominal and transvaginal ultrasound examinations of the  pelvis were performed. Transabdominal technique was performed for global imaging of the pelvis including uterus, ovaries, adnexal regions, and pelvic cul-de-sac. It was necessary to proceed with endovaginal exam following the transabdominal exam to visualize the ovaries, uterus and endometrium. Color and duplex Doppler ultrasound was utilized to evaluate blood flow to the ovaries. COMPARISON:  11/24/2016 FINDINGS: Uterus Measurements: 8.0 x 4.7 x 5.9 cm = volume: 119 mL. No fibroids or other mass visualized. Endometrium Thickness: 0.5 cm.  No focal abnormality visualized. Right ovary Measurements: 1.7 x 1.5 x 1.4 cm = volume: 2  mL. Normal appearance/no adnexal mass. Left ovary Measurements: 2.8 x 1.7 x 1.8 cm = volume: 4 mL. Normal appearance/no adnexal mass. Pulsed Doppler evaluation of both ovaries demonstrates normal low-resistance arterial and venous waveforms. Other findings No abnormal free fluid. IMPRESSION: Normal pelvic ultrasound.  No evidence for ovarian torsion. Electronically Signed   By: Richarda Overlie M.D.   On: 11/20/2018 13:48    Renal and pelvic ultrasound reviewed negative for acute findings ____________________________________________   PROCEDURES  Procedure(s) performed: None  Procedures  Critical Care performed: No  ____________________________________________   INITIAL IMPRESSION / ASSESSMENT AND PLAN / ED COURSE  Pertinent labs & imaging results that were available during my care of the patient were reviewed by me and considered in my medical decision making (see chart for details).   Reassuring clinical examination.  No evidence of acute abdomen.  Based on clinical assessment, her symptoms of discomfort and pain accompany vaginal bleeding.  She denies any infectious symptoms such as fever or dysuria.  Urinalysis does reveal notable bacteria in it, but also appears to be a dirty sample.  Without dysuria or infectious symptoms I will send for culture, patient also does not  report that she feels like she has symptoms of urinary tract infection  Symptomatology of 3 months time of symptoms argue strongly against acute intra-abdominal process.  Discussed with the patient she is comfortable with plan to obtain ultrasound and if this looks good, she be comfortable with the plan with outpatient follow-up with obstetrics.    ----------------------------------------- 2:59 PM on 11/20/2018 -----------------------------------------  Reviewed results with patient.  Discussed plan forward, offered CT scan to further evaluate though my clinical suspicion for acute abdomen or acute process very low.  With shared medical decision making, will have the patient follow-up closely with Dr. Jean Rosenthal.  She does not wish to undergo abdominal CT at this time and I think this is very reasonable.  Careful return precautions discussed, patient reports she thinks this could be secondary to her history of endometriosis potentially as well.  Reports good response and that she feels better if she takes muscle relaxant Flexeril in the evening, which she has at home already.  She is low on it though and I am happy to provide her a short additional course.  Return precautions and treatment recommendations and follow-up discussed with the patient who is agreeable with the plan.   ____________________________________________   FINAL CLINICAL IMPRESSION(S) / ED DIAGNOSES  Final diagnoses:  Pelvic pain        Note:  This document was prepared using Dragon voice recognition software and may include unintentional dictation errors       Sharyn Creamer, MD 11/20/18 1500

## 2018-11-20 NOTE — Discharge Instructions (Addendum)
Please follow up closely with obstetrics and gynecology or your primary doctor.  Return to the emergency room if your bleeding worsens, you become weak and dizzy or lightheaded, you have an episode of passing out, develop severe bleeding such as more than 1 soaked pad per hour for more than 3 straight hours, develop abdominal or pelvic pain, fevers chills or other new concerns arise.   

## 2018-11-21 LAB — SARS CORONAVIRUS 2 (TAT 6-24 HRS): SARS Coronavirus 2: NEGATIVE

## 2018-11-21 LAB — URINE CULTURE: Special Requests: NORMAL

## 2018-12-03 ENCOUNTER — Emergency Department
Admission: EM | Admit: 2018-12-03 | Discharge: 2018-12-03 | Disposition: A | Discharge status: Home / Self Care | Payer: Medicaid - Out of State | Attending: Emergency Medicine | Admitting: Emergency Medicine

## 2018-12-03 ENCOUNTER — Encounter: Payer: Self-pay | Admitting: Emergency Medicine

## 2018-12-03 ENCOUNTER — Emergency Department: Payer: Medicaid - Out of State

## 2018-12-03 ENCOUNTER — Other Ambulatory Visit: Payer: Self-pay

## 2018-12-03 DIAGNOSIS — M25551 Pain in right hip: Secondary | ICD-10-CM | POA: Insufficient documentation

## 2018-12-03 DIAGNOSIS — R0789 Other chest pain: Secondary | ICD-10-CM | POA: Insufficient documentation

## 2018-12-03 DIAGNOSIS — R079 Chest pain, unspecified: Secondary | ICD-10-CM

## 2018-12-03 DIAGNOSIS — J45909 Unspecified asthma, uncomplicated: Secondary | ICD-10-CM | POA: Diagnosis not present

## 2018-12-03 DIAGNOSIS — M25559 Pain in unspecified hip: Secondary | ICD-10-CM

## 2018-12-03 DIAGNOSIS — Z87891 Personal history of nicotine dependence: Secondary | ICD-10-CM | POA: Diagnosis not present

## 2018-12-03 LAB — BASIC METABOLIC PANEL
Anion gap: 9 (ref 5–15)
BUN: 7 mg/dL (ref 6–20)
CO2: 23 mmol/L (ref 22–32)
Calcium: 9.3 mg/dL (ref 8.9–10.3)
Chloride: 107 mmol/L (ref 98–111)
Creatinine, Ser: 0.97 mg/dL (ref 0.44–1.00)
GFR calc Af Amer: >60 mL/min (ref >60)
GFR calc non Af Amer: >60 mL/min (ref >60)
Glucose, Bld: 94 mg/dL (ref 70–99)
Potassium: 3.8 mmol/L (ref 3.5–5.1)
Sodium: 139 mmol/L (ref 135–145)

## 2018-12-03 LAB — TROPONIN I (HIGH SENSITIVITY): Troponin I (High Sensitivity): 3 ng/L (ref <18)

## 2018-12-03 LAB — FIBRIN DERIVATIVES D-DIMER (ARMC ONLY): Fibrin derivatives D-dimer (ARMC): 168.1 ng/mL (FEU) (ref 0.00–499.00)

## 2018-12-03 LAB — CBC
HCT: 40.3 % (ref 36.0–46.0)
Hemoglobin: 13.4 g/dL (ref 12.0–15.0)
MCH: 30 pg (ref 26.0–34.0)
MCHC: 33.3 g/dL (ref 30.0–36.0)
MCV: 90.4 fL (ref 80.0–100.0)
Platelets: 227 10*3/uL (ref 150–400)
RBC: 4.46 MIL/uL (ref 3.87–5.11)
RDW: 13.1 % (ref 11.5–15.5)
WBC: 5.6 10*3/uL (ref 4.0–10.5)
nRBC: 0 % (ref 0.0–0.2)

## 2018-12-03 LAB — POCT PREGNANCY, URINE: Preg Test, Ur: NEGATIVE

## 2018-12-03 MED ORDER — SODIUM CHLORIDE 0.9% FLUSH: 3.0000 mL | Freq: Once | INTRAVENOUS | Status: DC

## 2018-12-03 NOTE — ED Triage Notes (Signed)
Pt to ED via POV c/o chest and hip pain. Pt states that the pains started yesterday. Pt reports pain all across her chest that radiates into her back. Pt states that she has also had some tingling in her right arm and some lightheadedness. Pt is in NAD at this time. Pt has hx/o sciatic nerve pain.

## 2018-12-03 NOTE — ED Provider Notes (Signed)
[facility] Emergency Department Provider Note  Time seen: 8:58 AM  I have reviewed the triage vital signs and the nursing notes.   HISTORY  Chief Complaint Chest Pain and Hip Pain   HPI Kristy Reid is a 23 y.o. female with a past medical history of anemia, asthma, bipolar, presents to the emergency department for left chest pain.  According to the patient for the past 2 days she has been experiencing intermittent sharp pains in left chest as well as some mild shortness of breath with exertion.  Denies any fever cough or congestion.  Patient also states for the past few days she has been experiencing pain in the right hip.  States she went to work today but had a fall which exacerbated her pain so she came to the emergency department for evaluation.  Describes her hip pain as moderate, chest pain is moderate and sharp.   Past Medical History:  Diagnosis Date  . Anemia   . Anemia   . Anxiety   . Asthma   . Bipolar 1 disorder (HCC)   . Chronic back pain   . Depression   . Insomnia   . Personality disorder (HCC)    "boarderline"  . PTSD (post-traumatic stress disorder)     Patient Active Problem List   Diagnosis Date Noted  . Preterm contractions 06/03/2018  . Anemia affecting pregnancy in third trimester 05/30/2018  . Limited prenatal care in second trimester 05/19/2018  . Depression with suicidal ideation 05/04/2018  . Alcohol abuse 05/04/2018  . Schizoaffective disorder, bipolar type (HCC) 05/04/2018  . Anxiety disorder   . Bipolar 2 disorder (HCC) 11/11/2016  . Noncompliance 11/11/2016  . Severe recurrent major depression with psychotic features (HCC) 07/18/2016  . MDD (major depressive disorder), recurrent, severe, with psychosis (HCC) 07/17/2016  . Decreased fetal movement 03/22/2016  . Abdominal pain in pregnancy 01/21/2016  . Constipation during pregnancy in second trimester 12/26/2015  . Overdose 11/29/2015  . Supervision of high risk  pregnancy, antepartum 11/15/2015  . Cannabis use disorder, severe, dependence (HCC) 11/15/2015  . Tobacco use disorder 11/15/2015  . Asthma 11/15/2015  . Borderline personality disorder (HCC) 11/15/2015  . PTSD (post-traumatic stress disorder) 11/15/2015  . Severe recurrent major depression without psychotic features (HCC) 11/13/2015    Past Surgical History:  Procedure Laterality Date  . BUNIONECTOMY Bilateral    feet  . wrist sugery      Prior to Admission medications   Medication Sig Start Date End Date Taking? Authorizing Provider  butalbital-acetaminophen-caffeine (FIORICET, ESGIC) 50-325-40 MG tablet Take 1-2 tablets by mouth every 6 (six) hours as needed for headache. Patient not taking: Reported on 06/30/2018 05/23/18 05/23/19  Oswaldo Conroy, CNM  citalopram (CELEXA) 10 MG tablet Take 1 tablet (10 mg total) by mouth daily. Patient not taking: Reported on 06/30/2018 05/06/18   [name], [name], MD  cyclobenzaprine (FLEXERIL) 10 MG tablet Take 1 tablet (10 mg total) by mouth 3 (three) times daily as needed for muscle spasms. 11/20/18   Sharyn Creamer, MD  dicyclomine (BENTYL) 20 MG tablet Take 1 tablet (20 mg total) by mouth 3 (three) times daily as needed for spasms. 10/22/18 10/22/19  Miguel Aschoff., MD  docusate sodium (COLACE) 100 MG capsule Take 2 capsules (200 mg total) by mouth 2 (two) times daily. Patient not taking: Reported on 06/30/2018 05/05/18   [name], [name], MD  famotidine (PEPCID) 20 MG tablet Take 1 tablet (20 mg total) by mouth 2 (  two) times daily. Patient not taking: Reported on 06/30/2018 05/05/18   [name], Madie Reno, MD  famotidine (PEPCID) 20 MG tablet Take 1 tablet (20 mg total) by mouth 2 (two) times daily. 10/22/18 11/21/18  Lilia Pro., MD  ferrous sulfate 325 (65 FE) MG tablet Take 1 tablet (325 mg total) by mouth daily with breakfast. Patient not taking: Reported on 06/30/2018 05/05/18   [name], Madie Reno, MD  fluticasone (FLONASE) 50 MCG/ACT nasal spray Place 1 spray  into both nostrils 2 (two) times daily. Patient not taking: Reported on 06/30/2018 05/05/18   [name], Madie Reno, MD  folic acid (FOLVITE) 1 MG tablet Take 1 tablet (1 mg total) by mouth daily. Patient not taking: Reported on 06/30/2018 05/05/18   [name], Madie Reno, MD  loratadine (CLARITIN) 10 MG tablet Take 1 tablet (10 mg total) by mouth daily. Patient not taking: Reported on 06/30/2018 05/05/18   [name], Madie Reno, MD  Prenat-Fe Poly-Methfol-FA-DHA (VITAFOL FE+) 90-0.6-0.4-200 MG CAPS Take 1 tablet by mouth daily. Patient not taking: Reported on 06/04/2018 06/02/18   Homero Fellers, MD  promethazine (PHENERGAN) 25 MG tablet Take 1 tablet (25 mg total) by mouth every 6 (six) hours as needed for nausea or vomiting. Patient not taking: Reported on 06/30/2018 05/05/18   [name], Madie Reno, MD  traZODone (DESYREL) 150 MG tablet Take 1 tablet (150 mg total) by mouth at bedtime. Patient not taking: Reported on 06/30/2018 05/05/18   [name], Madie Reno, MD    Allergies  Allergen Reactions  . Penicillins Hives    Has patient had a PCN reaction causing immediate rash, facial/tongue/throat swelling, SOB or lightheadedness with hypotension: No Has patient had a PCN reaction causing severe rash involving mucus membranes or skin necrosis: No Has patient had a PCN reaction that required hospitalization No Has patient had a PCN reaction occurring within the last 10 years: No If all of the above answers are "NO", then may proceed with Cephalosporin use.  . Rocephin [Ceftriaxone] Hives  . Zithromax [Azithromycin] Itching and Nausea And Vomiting  . Lidocaine Hives, Itching and Rash    Family History  Problem Relation Age of Onset  . Diabetes Mother   . Hypertension Mother   . Mental illness Mother   . Diabetes Maternal Grandmother   . Heart disease Maternal Grandmother   . Cancer Neg Hx   . Ovarian cancer Neg Hx     Social History Social History   Tobacco Use  . Smoking status: Former Smoker    Types:  Cigarettes    Quit date: 08/13/2015    Years since quitting: 3.3  . Smokeless tobacco: Never Used  Substance Use Topics  . Alcohol use: No  . Drug use: Yes    Frequency: 7.0 times per week    Types: Marijuana    Comment: daily use, last use yesterday    Review of Systems Constitutional: Negative for fever. Cardiovascular: Intermittent sharp left-sided chest pains. Respiratory: Mild shortness of breath with exertion.  Negative for cough. Gastrointestinal: Negative for abdominal pain Musculoskeletal: Right hip pain. Skin: Negative for skin complaints  Neurological: Negative for headache All other ROS negative  ____________________________________________   PHYSICAL EXAM:  VITAL SIGNS: ED Triage Vitals  Enc Vitals Group     BP 12/03/18 0833 107/72     Pulse Rate 12/03/18 0833 99     Resp 12/03/18 0833 16     Temp 12/03/18 0836 98.2 F (36.8 C)     Temp Source 12/03/18 2025  Oral     SpO2 12/03/18 0833 99 %     Weight 12/03/18 0833 195 lb (88.5 kg)     Height 12/03/18 0833 5\' 2"  (1.575 m)     Head Circumference --      Peak Flow --      Pain Score 12/03/18 0832 8     Pain Loc --      Pain Edu? --      Excl. in GC? --     Constitutional: Alert and oriented. Well appearing and in no distress. Eyes: Normal exam ENT      Head: Normocephalic and atraumatic.      Mouth/Throat: Mucous membranes are moist. Cardiovascular: Normal rate, regular rhythm.  Respiratory: Normal respiratory effort without tachypnea nor retractions. Breath sounds are clear  Gastrointestinal: Soft and nontender. No distention. Musculoskeletal: Mild tenderness palpation of the right hip.  Moderate left chest tenderness to palpation. Neurologic:  Normal speech and language. No gross focal neurologic deficits Skin:  Skin is warm, dry and intact.  Psychiatric: Mood and affect are normal.  ____________________________________________    EKG  EKG viewed and interpreted by myself shows sinus  tachycardia 106 bpm with a narrow QRS, normal axis, normal intervals, no concerning ST changes.  ____________________________________________    RADIOLOGY  Chest x-ray is negative. Hip x-ray negative.  ____________________________________________   INITIAL IMPRESSION / ASSESSMENT AND PLAN / ED COURSE  Pertinent labs & imaging results that were available during my care of the patient were reviewed by me and considered in my medical decision making (see chart for details).   Patient presents emergency department for intermittent left chest pains.  States the pain is worse with deep inspiration.  Differential would include musculoskeletal pain, ACS, PE, pneumonia, pneumothorax, hip contusion less likely fracture dislocation as the patient has been ambulatory.  We will check labs including a d-dimer.  Obtain x-rays of the hip as well as chest.  Patient agreeable.  Imaging is normal.  Labs are largely nonrevealing.  Overall the patient appears well.  We will discharge home with PCP follow-up.  Kristy Reid was evaluated in Emergency Department on 12/03/2018 for the symptoms described in the history of present illness. She was evaluated in the context of the global COVID-19 pandemic, which necessitated consideration that the patient might be at risk for infection with the SARS-CoV-2 virus that causes COVID-19. Institutional protocols and algorithms that pertain to the evaluation of patients at risk for COVID-19 are in a state of rapid change based on information released by regulatory bodies including the CDC and federal and state organizations. These policies and algorithms were followed during the patient's care in the ED.  ____________________________________________   FINAL CLINICAL IMPRESSION(S) / ED DIAGNOSES  Chest pain Right hip pain   Minna AntisPaduchowski, Jamie Belger, MD 12/03/18 1028

## 2018-12-13 ENCOUNTER — Other Ambulatory Visit: Payer: Self-pay

## 2018-12-13 ENCOUNTER — Emergency Department
Admission: EM | Admit: 2018-12-13 | Discharge: 2018-12-13 | Disposition: A | Discharge status: Home / Self Care | Payer: Medicaid - Out of State | Attending: Emergency Medicine | Admitting: Emergency Medicine

## 2018-12-13 ENCOUNTER — Emergency Department: Payer: Medicaid - Out of State

## 2018-12-13 DIAGNOSIS — J069 Acute upper respiratory infection, unspecified: Secondary | ICD-10-CM | POA: Insufficient documentation

## 2018-12-13 DIAGNOSIS — Z20822 Contact with and (suspected) exposure to covid-19: Secondary | ICD-10-CM

## 2018-12-13 DIAGNOSIS — J45909 Unspecified asthma, uncomplicated: Secondary | ICD-10-CM | POA: Insufficient documentation

## 2018-12-13 DIAGNOSIS — Z79899 Other long term (current) drug therapy: Secondary | ICD-10-CM | POA: Diagnosis not present

## 2018-12-13 DIAGNOSIS — R509 Fever, unspecified: Secondary | ICD-10-CM | POA: Diagnosis present

## 2018-12-13 DIAGNOSIS — Z87891 Personal history of nicotine dependence: Secondary | ICD-10-CM | POA: Diagnosis not present

## 2018-12-13 MED ORDER — TRAMADOL HCL 50 MG PO TABS: 50.0000 mg | ORAL_TABLET | Freq: Four times a day (QID) | ORAL | 0 refills | Status: AC | PRN

## 2018-12-13 MED ORDER — ONDANSETRON 8 MG PO TBDP
8.0000 mg | ORAL_TABLET | Freq: Once | ORAL | Status: AC
  Administered 2018-12-13: 8 mg via ORAL
  Filled 2018-12-13: qty 1

## 2018-12-13 MED ORDER — FEXOFENADINE-PSEUDOEPHED ER 60-120 MG PO TB12: 1.0000 | ORAL_TABLET | Freq: Two times a day (BID) | ORAL | 0 refills | Status: DC

## 2018-12-13 MED ORDER — IBUPROFEN 600 MG PO TABS: 600.0000 mg | ORAL_TABLET | Freq: Three times a day (TID) | ORAL | 0 refills | Status: DC | PRN

## 2018-12-13 MED ORDER — BENZONATATE 100 MG PO CAPS: 200.0000 mg | ORAL_CAPSULE | Freq: Three times a day (TID) | ORAL | 0 refills | Status: DC | PRN

## 2018-12-13 MED ORDER — BENZONATATE 100 MG PO CAPS
200.0000 mg | ORAL_CAPSULE | Freq: Once | ORAL | Status: AC
  Administered 2018-12-13: 200 mg via ORAL
  Filled 2018-12-13: qty 2

## 2018-12-13 MED ORDER — KETOROLAC TROMETHAMINE 60 MG/2ML IM SOLN
60.0000 mg | Freq: Once | INTRAMUSCULAR | Status: AC
  Administered 2018-12-13: 60 mg via INTRAMUSCULAR
  Filled 2018-12-13: qty 2

## 2018-12-13 NOTE — ED Provider Notes (Signed)
[facility] Emergency Department Provider Note   ____________________________________________   First MD Initiated Contact with Patient 12/13/18 [phone]     (approximate)  I have reviewed the triage vital signs and the nursing notes.   HISTORY  Chief Complaint URI    HPI [name] is a 23 y.o. female patient complain of 3 days of fever, body aches, productive cough, nausea, and vomiting.  Patient states her boyfriend is sick with the same symptoms.  Patient boyfriend was seen last night and is now on self quarantine pending COVID-19 results.  Patient rates her pain discomfort as a 8/10.  Patient describes her pain is "achy".  No palliative measure for complaint.         Past Medical History:  Diagnosis Date  . Anemia   . Anemia   . Anxiety   . Asthma   . Bipolar 1 disorder (HCC)   . Chronic back pain   . Depression   . Insomnia   . Personality disorder (HCC)    "boarderline"  . PTSD (post-traumatic stress disorder)     Patient Active Problem List   Diagnosis Date Noted  . Preterm contractions 06/03/2018  . Anemia affecting pregnancy in third trimester 05/30/2018  . Limited prenatal care in second trimester 05/19/2018  . Depression with suicidal ideation 05/04/2018  . Alcohol abuse 05/04/2018  . Schizoaffective disorder, bipolar type (HCC) 05/04/2018  . Anxiety disorder   . Bipolar 2 disorder (HCC) 11/11/2016  . Noncompliance 11/11/2016  . Severe recurrent major depression with psychotic features (HCC) 07/18/2016  . MDD (major depressive disorder), recurrent, severe, with psychosis (HCC) 07/17/2016  . Decreased fetal movement 03/22/2016  . Abdominal pain in pregnancy 01/21/2016  . Constipation during pregnancy in second trimester 12/26/2015  . Overdose 11/29/2015  . Supervision of high risk pregnancy, antepartum 11/15/2015  . Cannabis use disorder, severe, dependence (HCC) 11/15/2015  . Tobacco use disorder 11/15/2015  .  Asthma 11/15/2015  . Borderline personality disorder (HCC) 11/15/2015  . PTSD (post-traumatic stress disorder) 11/15/2015  . Severe recurrent major depression without psychotic features (HCC) 11/13/2015    Past Surgical History:  Procedure Laterality Date  . BUNIONECTOMY Bilateral    feet  . wrist sugery      Prior to Admission medications   Medication Sig Start Date End Date Taking? Authorizing Provider  acetaminophen (TYLENOL) 500 MG tablet Take 500-1,000 mg by mouth every 6 (six) hours as needed for mild pain or moderate pain.    [provider]  benzonatate (TESSALON PERLES) 100 MG capsule Take 2 capsules (200 mg total) by mouth 3 (three) times daily as needed. 12/13/18 12/13/19  Joni ReiningSmith, Ronald K, PA-C  dicyclomine (BENTYL) 20 MG tablet Take 1 tablet (20 mg total) by mouth 3 (three) times daily as needed for spasms. Patient not taking: Reported on 12/03/2018 10/22/18 10/22/19  Miguel AschoffMonks, Sarah L., MD  fexofenadine-pseudoephedrine (ALLEGRA-D) 60-120 MG 12 hr tablet Take 1 tablet by mouth 2 (two) times daily. 12/13/18   Joni ReiningSmith, Ronald K, PA-C  ibuprofen (ADVIL) 200 MG tablet Take 400-800 mg by mouth every 6 (six) hours as needed for fever or headache.    [provider]  ibuprofen (ADVIL) 600 MG tablet Take 1 tablet (600 mg total) by mouth every 8 (eight) hours as needed. 12/13/18   Joni ReiningSmith, Ronald K, PA-C  loratadine (CLARITIN) 10 MG tablet Take 1 tablet (10 mg total) by mouth daily. Patient not taking: Reported on 06/30/2018 05/05/18   [name],  [name], MD  traMADol (ULTRAM) 50 MG tablet Take 1 tablet (50 mg total) by mouth every 6 (six) hours as needed for up to 3 days. 12/13/18 12/16/18  Joni Reining, PA-C    Allergies Penicillins, Rocephin [ceftriaxone], Zithromax [azithromycin], and Lidocaine  Family History  Problem Relation Age of Onset  . Diabetes Mother   . Hypertension Mother   . Mental illness Mother   . Diabetes Maternal Grandmother   . Heart disease Maternal  Grandmother   . Cancer Neg Hx   . Ovarian cancer Neg Hx     Social History Social History   Tobacco Use  . Smoking status: Former Smoker    Types: Cigarettes    Quit date: 08/13/2015    Years since quitting: 3.3  . Smokeless tobacco: Never Used  Substance Use Topics  . Alcohol use: No  . Drug use: Yes    Frequency: 7.0 times per week    Types: Marijuana    Comment: daily use, last use yesterday    Review of Systems Constitutional: Fever/chills.  Body aches. Eyes: No visual changes. ENT: No sore throat. Cardiovascular: Denies chest pain. Respiratory: Denies shortness of breath.  Productive cough Gastrointestinal: No abdominal pain.  No nausea, no vomiting.  No diarrhea.  No constipation. Genitourinary: Negative for dysuria. Musculoskeletal: Negative for back pain. Skin: Negative for rash. Neurological: Positive for headaches, but denies focal weakness or numbness. Psychiatric:  Anxiety and depression. Allergic/Immunilogical: See allergy list.. ____________________________________________   PHYSICAL EXAM:  VITAL SIGNS: ED Triage Vitals  Enc Vitals Group     BP 12/13/18 0855 127/76     Pulse Rate 12/13/18 0935 (!) 110     Resp --      Temp 12/13/18 0855 99.3 F (37.4 C)     Temp Source 12/13/18 0855 Oral     SpO2 12/13/18 0855 96 %     Weight 12/13/18 0856 185 lb (83.9 kg)     Height 12/13/18 0856 5\' 2"  (1.575 m)     Head Circumference --      Peak Flow --      Pain Score 12/13/18 0857 8     Pain Loc --      Pain Edu? --      Excl. in GC? --    Constitutional: Alert and oriented. Well appearing and in no acute distress. Neck: No stridor.   Hematological/Lymphatic/Immunilogical: No cervical lymphadenopathy. Cardiovascular: Normal rate, regular rhythm. Grossly normal heart sounds.  Good peripheral circulation. Respiratory: Normal respiratory effort.  No retractions. Lungs CTAB. Gastrointestinal: Soft and nontender. No distention. No abdominal bruits. No CVA  tenderness. Musculoskeletal: No lower extremity tenderness nor edema.  No joint effusions. Neurologic:  Normal speech and language. No gross focal neurologic deficits are appreciated. No gait instability. Skin:  Skin is warm, dry and intact. No rash noted. Psychiatric: Mood and affect are normal. Speech and behavior are normal.  ____________________________________________   LABS (all labs ordered are listed, but only abnormal results are displayed)  Labs Reviewed - No data to display ____________________________________________  EKG   ____________________________________________  RADIOLOGY  ED MD interpretation:    Official radiology report(s): Dg Chest Portable 1 View  Result Date: 12/13/2018 CLINICAL DATA:  Cough with fever, body aches for 3 days EXAM: PORTABLE CHEST 1 VIEW COMPARISON:  12/03/2018 FINDINGS: The heart size and mediastinal contours are within normal limits. Both lungs are clear. The visualized skeletal structures are unremarkable. IMPRESSION: No active disease. Electronically Signed   By:  Kathreen Devoid   On: 12/13/2018 10:04    ____________________________________________   PROCEDURES  Procedure(s) performed (including Critical Care):  Procedures   ____________________________________________   INITIAL IMPRESSION / ASSESSMENT AND PLAN / ED COURSE  As part of my medical decision making, I reviewed the following data within the [place] was evaluated in Emergency Department on 12/13/2018 for the symptoms described in the history of present illness. She was evaluated in the context of the global COVID-19 pandemic, which necessitated consideration that the patient might be at risk for infection with the SARS-CoV-2 virus that causes COVID-19. Institutional protocols and algorithms that pertain to the evaluation of patients at risk for COVID-19 are in a state of rapid change based on information released by  regulatory bodies including the CDC and federal and state organizations. These policies and algorithms were followed during the patient's care in the ED.  Patient presents URI signs as for the last 3 days.  Discussed x-ray findings with patient.  Patient physical exam consistent with upper respiratory infection.  Patient will have a COVID-19 test and will self quarantine pending results.      ____________________________________________   FINAL CLINICAL IMPRESSION(S) / ED DIAGNOSES  Final diagnoses:  Viral upper respiratory tract infection     ED Discharge Orders         Ordered    benzonatate (TESSALON PERLES) 100 MG capsule  3 times daily PRN     12/13/18 1011    fexofenadine-pseudoephedrine (ALLEGRA-D) 60-120 MG 12 hr tablet  2 times daily     12/13/18 1011    ibuprofen (ADVIL) 600 MG tablet  Every 8 hours PRN     12/13/18 1011    traMADol (ULTRAM) 50 MG tablet  Every 6 hours PRN     12/13/18 1011           Note:  This document was prepared using Dragon voice recognition software and may include unintentional dictation errors.    Sable Feil, PA-C 12/13/18 1013    Harvest Dark, MD 12/13/18 [phone]

## 2018-12-13 NOTE — Discharge Instructions (Signed)
Advised to go to the outpatient clinic and have COVID-19 testing today.

## 2018-12-13 NOTE — ED Notes (Signed)
See triage note  Presents with cough,sore throat and body aches for the past 3-4 days  States fever at home was 103  Low grade on arrival   States her b/f was tested yesterday for COVID

## 2018-12-13 NOTE — ED Triage Notes (Signed)
Pt c/o cough with fever, body aches for the past 3 days. Pt is in NAD on arrival. Ambulatory to triage without difficulty.

## 2018-12-14 LAB — NOVEL CORONAVIRUS, NAA: SARS-CoV-2, NAA: NOT DETECTED

## 2018-12-23 ENCOUNTER — Encounter: Payer: Self-pay | Admitting: Emergency Medicine

## 2018-12-23 ENCOUNTER — Other Ambulatory Visit: Payer: Self-pay

## 2018-12-23 ENCOUNTER — Emergency Department: Payer: Medicaid - Out of State

## 2018-12-23 ENCOUNTER — Emergency Department
Admission: EM | Admit: 2018-12-23 | Discharge: 2018-12-23 | Disposition: A | Discharge status: Home / Self Care | Payer: Medicaid - Out of State | Attending: Emergency Medicine | Admitting: Emergency Medicine

## 2018-12-23 DIAGNOSIS — J45909 Unspecified asthma, uncomplicated: Secondary | ICD-10-CM | POA: Diagnosis not present

## 2018-12-23 DIAGNOSIS — Z87891 Personal history of nicotine dependence: Secondary | ICD-10-CM | POA: Insufficient documentation

## 2018-12-23 DIAGNOSIS — N760 Acute vaginitis: Secondary | ICD-10-CM | POA: Insufficient documentation

## 2018-12-23 DIAGNOSIS — B9689 Other specified bacterial agents as the cause of diseases classified elsewhere: Secondary | ICD-10-CM

## 2018-12-23 DIAGNOSIS — R102 Pelvic and perineal pain: Secondary | ICD-10-CM

## 2018-12-23 DIAGNOSIS — R1084 Generalized abdominal pain: Secondary | ICD-10-CM | POA: Diagnosis present

## 2018-12-23 DIAGNOSIS — M5410 Radiculopathy, site unspecified: Secondary | ICD-10-CM

## 2018-12-23 LAB — URINALYSIS, COMPLETE (UACMP) WITH MICROSCOPIC
Bilirubin Urine: NEGATIVE
Bilirubin Urine: NEGATIVE
Glucose, UA: NEGATIVE mg/dL
Glucose, UA: NEGATIVE mg/dL
Hgb urine dipstick: NEGATIVE
Hgb urine dipstick: NEGATIVE
Ketones, ur: NEGATIVE mg/dL
Ketones, ur: NEGATIVE mg/dL
Nitrite: NEGATIVE
Nitrite: NEGATIVE
Protein, ur: NEGATIVE mg/dL
Protein, ur: NEGATIVE mg/dL
Specific Gravity, Urine: 1.021 (ref 1.005–1.030)
Specific Gravity, Urine: 1.019 (ref 1.005–1.030)
pH: 6 (ref 5.0–8.0)
pH: 6 (ref 5.0–8.0)

## 2018-12-23 LAB — COMPREHENSIVE METABOLIC PANEL
ALT: 22 U/L (ref 0–44)
AST: 22 U/L (ref 15–41)
Albumin: 3.8 g/dL (ref 3.5–5.0)
Alkaline Phosphatase: 70 U/L (ref 38–126)
Anion gap: 9 (ref 5–15)
BUN: 8 mg/dL (ref 6–20)
CO2: 21 mmol/L — ABNORMAL LOW (ref 22–32)
Calcium: 9.4 mg/dL (ref 8.9–10.3)
Chloride: 110 mmol/L (ref 98–111)
Creatinine, Ser: 0.86 mg/dL (ref 0.44–1.00)
GFR calc Af Amer: >60 mL/min (ref >60)
GFR calc non Af Amer: >60 mL/min (ref >60)
Glucose, Bld: 87 mg/dL (ref 70–99)
Potassium: 3.9 mmol/L (ref 3.5–5.1)
Sodium: 140 mmol/L (ref 135–145)
Total Bilirubin: 0.5 mg/dL (ref 0.3–1.2)
Total Protein: 7.1 g/dL (ref 6.5–8.1)

## 2018-12-23 LAB — LIPASE, BLOOD: Lipase: 28 U/L (ref 11–51)

## 2018-12-23 LAB — CBC
HCT: 39.8 % (ref 36.0–46.0)
Hemoglobin: 13.1 g/dL (ref 12.0–15.0)
MCH: 29 pg (ref 26.0–34.0)
MCHC: 32.9 g/dL (ref 30.0–36.0)
MCV: 88.1 fL (ref 80.0–100.0)
Platelets: 260 10*3/uL (ref 150–400)
RBC: 4.52 MIL/uL (ref 3.87–5.11)
RDW: 13.3 % (ref 11.5–15.5)
WBC: 6.9 10*3/uL (ref 4.0–10.5)
nRBC: 0 % (ref 0.0–0.2)

## 2018-12-23 LAB — WET PREP, GENITAL
Sperm: NONE SEEN
Trich, Wet Prep: NONE SEEN
Yeast Wet Prep HPF POC: NONE SEEN

## 2018-12-23 LAB — POCT PREGNANCY, URINE: Preg Test, Ur: NEGATIVE

## 2018-12-23 MED ORDER — METRONIDAZOLE 500 MG PO TABS
500.0000 mg | ORAL_TABLET | Freq: Once | ORAL | Status: DC
  Filled 2018-12-23: qty 1

## 2018-12-23 MED ORDER — METRONIDAZOLE 500 MG PO TABS: 500.0000 mg | ORAL_TABLET | Freq: Two times a day (BID) | ORAL | 0 refills | Status: AC

## 2018-12-23 NOTE — Discharge Instructions (Addendum)
Please take antibiotics as prescribed.  Avoid sexual activity and to be completed antibiotics and symptoms have resolved.  Return to the ER for any increasing pain worsening symptoms or urgent changes in health.  Take ibuprofen 600 mg 3 times daily as needed with food for lower leg pain.

## 2018-12-23 NOTE — ED Notes (Signed)
Assisted PA with pelvic exam. Wet prep sample and dirty urine sent to the lab.

## 2018-12-23 NOTE — ED Notes (Signed)
Patient transported to Ultrasound 

## 2018-12-23 NOTE — ED Provider Notes (Signed)
[facility] EMERGENCY DEPARTMENT Provider Note   CSN: [id number] Arrival date & time: 12/23/18  1412     History   Chief Complaint Chief Complaint  Patient presents with  . Abdominal Pain    HPI Kristy SongKori Janae Reid is a 23 y.o. female presents to the emergency department for evaluation of multiple medical complaints.  Patient complains of abdominal cramping for 3 to 4 days along with a pain rating down her legs into her feet that she describes as numbness and tingling.  She also describes some mild lower back pain..  No trauma or injury.  No vaginal bleeding, discharge or painful urination.  No fevers, chest pain or shortness of breath.  She is ambulatory.  Abdominal pain, cramping, radicular symptoms of both legs increased with activity she gets relief with rest.     HPI  Past Medical History:  Diagnosis Date  . Anemia   . Anemia   . Anxiety   . Asthma   . Bipolar 1 disorder (HCC)   . Chronic back pain   . Depression   . Insomnia   . Personality disorder (HCC)    "boarderline"  . PTSD (post-traumatic stress disorder)     Patient Active Problem List   Diagnosis Date Noted  . Preterm contractions 06/03/2018  . Anemia affecting pregnancy in third trimester 05/30/2018  . Limited prenatal care in second trimester 05/19/2018  . Depression with suicidal ideation 05/04/2018  . Alcohol abuse 05/04/2018  . Schizoaffective disorder, bipolar type (HCC) 05/04/2018  . Anxiety disorder   . Bipolar 2 disorder (HCC) 11/11/2016  . Noncompliance 11/11/2016  . Severe recurrent major depression with psychotic features (HCC) 07/18/2016  . MDD (major depressive disorder), recurrent, severe, with psychosis (HCC) 07/17/2016  . Decreased fetal movement 03/22/2016  . Abdominal pain in pregnancy 01/21/2016  . Constipation during pregnancy in second trimester 12/26/2015  . Overdose 11/29/2015  . Supervision of high risk pregnancy, antepartum 11/15/2015  . Cannabis use  disorder, severe, dependence (HCC) 11/15/2015  . Tobacco use disorder 11/15/2015  . Asthma 11/15/2015  . Borderline personality disorder (HCC) 11/15/2015  . PTSD (post-traumatic stress disorder) 11/15/2015  . Severe recurrent major depression without psychotic features (HCC) 11/13/2015    Past Surgical History:  Procedure Laterality Date  . BUNIONECTOMY Bilateral    feet  . wrist sugery       OB History    Gravida  4   Para  2   Term  2   Preterm  0   AB  1   Living  2     SAB  1   TAB  0   Ectopic  0   Multiple  0   Live Births  2            Home Medications    Prior to Admission medications   Medication Sig Start Date End Date Taking? Authorizing Provider  acetaminophen (TYLENOL) 500 MG tablet Take 500-1,000 mg by mouth every 6 (six) hours as needed for mild pain or moderate pain.    [provider]  benzonatate (TESSALON PERLES) 100 MG capsule Take 2 capsules (200 mg total) by mouth 3 (three) times daily as needed. 12/13/18 12/13/19  Joni ReiningSmith, Ronald K, PA-C  dicyclomine (BENTYL) 20 MG tablet Take 1 tablet (20 mg total) by mouth 3 (three) times daily as needed for spasms. Patient not taking: Reported on 12/03/2018 10/22/18 10/22/19  Miguel AschoffMonks, Sarah L., MD  fexofenadine-pseudoephedrine (ALLEGRA-D) 60-120 MG 12 hr  tablet Take 1 tablet by mouth 2 (two) times daily. 12/13/18   Sable Feil, PA-C  ibuprofen (ADVIL) 200 MG tablet Take 400-800 mg by mouth every 6 (six) hours as needed for fever or headache.    [provider]  ibuprofen (ADVIL) 600 MG tablet Take 1 tablet (600 mg total) by mouth every 8 (eight) hours as needed. 12/13/18   Sable Feil, PA-C  loratadine (CLARITIN) 10 MG tablet Take 1 tablet (10 mg total) by mouth daily. Patient not taking: Reported on 06/30/2018 05/05/18   [name], Madie Reno, MD  metroNIDAZOLE (FLAGYL) 500 MG tablet Take 1 tablet (500 mg total) by mouth 2 (two) times daily for 7 days. 12/23/18 12/30/18  Duanne Guess, PA-C     Family History Family History  Problem Relation Age of Onset  . Diabetes Mother   . Hypertension Mother   . Mental illness Mother   . Diabetes Maternal Grandmother   . Heart disease Maternal Grandmother   . Cancer Neg Hx   . Ovarian cancer Neg Hx     Social History Social History   Tobacco Use  . Smoking status: Former Smoker    Types: Cigarettes    Quit date: 08/13/2015    Years since quitting: 3.3  . Smokeless tobacco: Never Used  Substance Use Topics  . Alcohol use: No  . Drug use: Yes    Frequency: 7.0 times per week    Types: Marijuana    Comment: daily use, last use yesterday     Allergies   Penicillins, Rocephin [ceftriaxone], Zithromax [azithromycin], and Lidocaine   Review of Systems Review of Systems  Constitutional: Negative for fatigue and fever.  HENT: Negative for sinus pressure and sore throat.   Respiratory: Negative for shortness of breath.   Cardiovascular: Negative for chest pain.  Gastrointestinal: Positive for abdominal pain. Negative for nausea and vomiting.  Genitourinary: Negative for flank pain.  Musculoskeletal: Positive for back pain. Negative for myalgias and neck pain.  Skin: Negative for rash and wound.  Neurological: Positive for numbness.     Physical Exam Updated Vital Signs BP (!) 141/87 (BP Location: Left Arm)   Pulse 90   Temp 98.4 F (36.9 C) (Oral)   Resp 18   Ht 5\' 2"  (1.575 m)   Wt 86.2 kg   LMP 11/08/2018   SpO2 100%   BMI 34.75 kg/m   Physical Exam Constitutional:      General: She is not in acute distress.    Appearance: She is well-developed.  HENT:     Head: Normocephalic and atraumatic.  Eyes:     General:        Right eye: No discharge.        Left eye: No discharge.     Conjunctiva/sclera: Conjunctivae normal.     Pupils: Pupils are equal, round, and reactive to light.  Neck:     Musculoskeletal: Normal range of motion and neck supple.  Cardiovascular:     Rate and Rhythm: Normal rate and  regular rhythm.  Pulmonary:     Effort: Pulmonary effort is normal. No respiratory distress.     Breath sounds: Normal breath sounds.  Chest:     Chest wall: No tenderness.  Abdominal:     General: Abdomen is flat. There is no distension.     Palpations: Abdomen is soft.     Tenderness: There is no abdominal tenderness. There is no guarding.  Genitourinary:    Comments: No abnormal  drainage.  No cervical motion tenderness.  No abnormal lesions. Musculoskeletal: Normal range of motion.  Skin:    General: Skin is warm and dry.     Findings: No rash.  Neurological:     Mental Status: She is alert and oriented to person, place, and time.     Deep Tendon Reflexes: Reflexes are normal and symmetric.  Psychiatric:        Behavior: Behavior normal.        Thought Content: Thought content normal.      ED Treatments / Results  Labs (all labs ordered are listed, but only abnormal results are displayed) Labs Reviewed  WET PREP, GENITAL - Abnormal; Notable for the following components:      Result Value   Clue Cells Wet Prep HPF POC RARE (*)    WBC, Wet Prep HPF POC RARE (*)    All other components within normal limits  COMPREHENSIVE METABOLIC PANEL - Abnormal; Notable for the following components:   CO2 21 (*)    All other components within normal limits  URINALYSIS, COMPLETE (UACMP) WITH MICROSCOPIC - Abnormal; Notable for the following components:   Color, Urine YELLOW (*)    APPearance CLOUDY (*)    Leukocytes,Ua MODERATE (*)    Bacteria, UA MANY (*)    All other components within normal limits  URINALYSIS, COMPLETE (UACMP) WITH MICROSCOPIC - Abnormal; Notable for the following components:   Color, Urine YELLOW (*)    APPearance CLEAR (*)    Leukocytes,Ua TRACE (*)    Bacteria, UA FEW (*)    All other components within normal limits  GC/CHLAMYDIA PROBE AMP  LIPASE, BLOOD  CBC  POC URINE PREG, ED  POCT PREGNANCY, URINE    EKG None  Radiology US Pelvic Complete With  Transvaginal  Result Date: 12/23/2018 CLINICAL DATA:  Pelvic pain.  Left lower quadrant pain. EXAM: TRANSABDOMINAL AND TRANSVAGINAL ULTRASOUND OF PELVIS TECHNIQUE: Both transabdominal and transvaginal ultrasound examinations of the pelvis were performed. Transabdominal technique was performed for global imaging of the pelvis including uterus, ovaries, adnexal regions, and pelvic cul-de-sac. It was necessary to proceed with endovaginal exam following the transabdominal exam to visualize the uterus, ovaries, and adnexa. COMPARISON:  11/20/2018 FINDINGS: Uterus Measurements: 6.9 x 4.0 x 4.1 cm = volume: 60 mL. No fibroids or other mass visualized. Endometrium Thickness: 6 mm.  No focal abnormality visualized. Right ovary Measurements: 2.5 x 1.6 x 1.9 cm = volume: 3.9 mL. Normal appearance/no adnexal mass. Left ovary Measurements: 1.7 x 1.4 x 1.9 cm = volume: 2.4 mL. Normal appearance/no adnexal mass. Other findings No abnormal free fluid. IMPRESSION: Normal pelvic ultrasound for age. No explanation for left lower quadrant pain. Electronically Signed   By: Jeronimo Greaves M.D.   On: 12/23/2018 18:52    Procedures Procedures (including critical care time)  Medications Ordered in ED Medications  metroNIDAZOLE (FLAGYL) tablet 500 mg (has no administration in time range)     Initial Impression / Assessment and Plan / ED Course  I have reviewed the triage vital signs and the nursing notes.  Pertinent labs & imaging results that were available during my care of the patient were reviewed by me and considered in my medical decision making (see chart for details).        23 year old female with lower pelvic pain, cramping.  Patient's labs are normal.  Urinalysis showed no signs of UTI.  Pelvic ultrasound normal.  Urine pregnancy test negative.  She does have clue cells on  wet prep.  Urine gonorrhea chlamydia test pending.  No cervical motion tenderness or purulent discharge.  Patient will take metronidazole  for BV.  Patient also having some reports of some mild radicular symptoms, will have her take ibuprofen.  No neurological deficits.  Vital signs are stable.  Patient understands signs symptoms return to ED for.  Final Clinical Impressions(s) / ED Diagnoses   Final diagnoses:  Bacterial vaginitis    ED Discharge Orders         Ordered    metroNIDAZOLE (FLAGYL) 500 MG tablet  2 times daily     12/23/18 2006           Ronnette Juniper 12/23/18 2010    Shaune Pollack, MD 12/25/18 [phone]

## 2018-12-23 NOTE — ED Triage Notes (Signed)
Patient reports abdominal cramping for 3-4 days. Denies N/V/D. Denies bleeding or discharge. Reports last normal period was in august.

## 2018-12-26 LAB — GC/CHLAMYDIA PROBE AMP
Chlamydia trachomatis, NAA: NEGATIVE
Neisseria Gonorrhoeae by PCR: NEGATIVE

## 2018-12-28 ENCOUNTER — Other Ambulatory Visit: Payer: Self-pay

## 2018-12-28 ENCOUNTER — Emergency Department (HOSPITAL_COMMUNITY): Payer: Medicaid - Out of State

## 2018-12-28 ENCOUNTER — Emergency Department (HOSPITAL_COMMUNITY)
Admission: EM | Admit: 2018-12-28 | Discharge: 2018-12-29 | Discharge status: Against Medical Advice | Payer: Medicaid - Out of State | Attending: Emergency Medicine | Admitting: Emergency Medicine

## 2018-12-28 ENCOUNTER — Encounter (HOSPITAL_COMMUNITY): Payer: Self-pay

## 2018-12-28 DIAGNOSIS — R0789 Other chest pain: Secondary | ICD-10-CM | POA: Diagnosis present

## 2018-12-28 DIAGNOSIS — Z5321 Procedure and treatment not carried out due to patient leaving prior to being seen by health care provider: Secondary | ICD-10-CM | POA: Insufficient documentation

## 2018-12-28 LAB — I-STAT BETA HCG BLOOD, ED (MC, WL, AP ONLY): I-stat hCG, quantitative: <5 m[IU]/mL (ref <5)

## 2018-12-28 LAB — CBC
HCT: 42 % (ref 36.0–46.0)
Hemoglobin: 13.9 g/dL (ref 12.0–15.0)
MCH: 29.8 pg (ref 26.0–34.0)
MCHC: 33.1 g/dL (ref 30.0–36.0)
MCV: 89.9 fL (ref 80.0–100.0)
Platelets: 245 10*3/uL (ref 150–400)
RBC: 4.67 MIL/uL (ref 3.87–5.11)
RDW: 13.8 % (ref 11.5–15.5)
WBC: 4.5 10*3/uL (ref 4.0–10.5)
nRBC: 0 % (ref 0.0–0.2)

## 2018-12-28 MED ORDER — SODIUM CHLORIDE 0.9% FLUSH: 3.0000 mL | Freq: Once | INTRAVENOUS | Status: DC

## 2018-12-28 NOTE — ED Triage Notes (Signed)
Pt reports chest pain for 2 weeks, generalized body aches for 1 week and right ear pain for 3 days. Pt a.o, nad noted, denies fever

## 2018-12-28 NOTE — ED Notes (Signed)
Pt said the wait was too long so she left.

## 2018-12-29 LAB — BASIC METABOLIC PANEL
Anion gap: 9 (ref 5–15)
BUN: 8 mg/dL (ref 6–20)
CO2: 22 mmol/L (ref 22–32)
Calcium: 9.5 mg/dL (ref 8.9–10.3)
Chloride: 107 mmol/L (ref 98–111)
Creatinine, Ser: 0.9 mg/dL (ref 0.44–1.00)
GFR calc Af Amer: >60 mL/min (ref >60)
GFR calc non Af Amer: >60 mL/min (ref >60)
Glucose, Bld: 87 mg/dL (ref 70–99)
Potassium: 3.8 mmol/L (ref 3.5–5.1)
Sodium: 138 mmol/L (ref 135–145)

## 2018-12-29 LAB — TROPONIN I (HIGH SENSITIVITY): Troponin I (High Sensitivity): <2 ng/L (ref <18)

## 2018-12-30 ENCOUNTER — Emergency Department
Admission: EM | Admit: 2018-12-30 | Discharge: 2018-12-30 | Disposition: A | Discharge status: Home / Self Care | Payer: Medicaid - Out of State | Attending: Emergency Medicine | Admitting: Emergency Medicine

## 2018-12-30 ENCOUNTER — Emergency Department: Payer: Medicaid - Out of State

## 2018-12-30 ENCOUNTER — Encounter: Payer: Self-pay | Admitting: Emergency Medicine

## 2018-12-30 ENCOUNTER — Other Ambulatory Visit: Payer: Self-pay

## 2018-12-30 DIAGNOSIS — Y92012 Bathroom of single-family (private) house as the place of occurrence of the external cause: Secondary | ICD-10-CM | POA: Insufficient documentation

## 2018-12-30 DIAGNOSIS — N3090 Cystitis, unspecified without hematuria: Secondary | ICD-10-CM | POA: Insufficient documentation

## 2018-12-30 DIAGNOSIS — U071 COVID-19: Secondary | ICD-10-CM | POA: Diagnosis not present

## 2018-12-30 DIAGNOSIS — Y999 Unspecified external cause status: Secondary | ICD-10-CM | POA: Diagnosis not present

## 2018-12-30 DIAGNOSIS — Y93E1 Activity, personal bathing and showering: Secondary | ICD-10-CM | POA: Diagnosis not present

## 2018-12-30 DIAGNOSIS — Z87891 Personal history of nicotine dependence: Secondary | ICD-10-CM | POA: Insufficient documentation

## 2018-12-30 DIAGNOSIS — Z20828 Contact with and (suspected) exposure to other viral communicable diseases: Secondary | ICD-10-CM

## 2018-12-30 DIAGNOSIS — J45909 Unspecified asthma, uncomplicated: Secondary | ICD-10-CM | POA: Diagnosis not present

## 2018-12-30 DIAGNOSIS — W19XXXA Unspecified fall, initial encounter: Secondary | ICD-10-CM

## 2018-12-30 DIAGNOSIS — H6690 Otitis media, unspecified, unspecified ear: Secondary | ICD-10-CM

## 2018-12-30 DIAGNOSIS — H6691 Otitis media, unspecified, right ear: Secondary | ICD-10-CM | POA: Insufficient documentation

## 2018-12-30 DIAGNOSIS — Z20822 Contact with and (suspected) exposure to covid-19: Secondary | ICD-10-CM

## 2018-12-30 DIAGNOSIS — R52 Pain, unspecified: Secondary | ICD-10-CM

## 2018-12-30 DIAGNOSIS — N3000 Acute cystitis without hematuria: Secondary | ICD-10-CM

## 2018-12-30 DIAGNOSIS — M791 Myalgia, unspecified site: Secondary | ICD-10-CM | POA: Diagnosis present

## 2018-12-30 DIAGNOSIS — W182XXA Fall in (into) shower or empty bathtub, initial encounter: Secondary | ICD-10-CM | POA: Diagnosis not present

## 2018-12-30 LAB — URINALYSIS, COMPLETE (UACMP) WITH MICROSCOPIC
Bilirubin Urine: NEGATIVE
Glucose, UA: NEGATIVE mg/dL
Hgb urine dipstick: NEGATIVE
Ketones, ur: NEGATIVE mg/dL
Nitrite: NEGATIVE
Protein, ur: NEGATIVE mg/dL
Specific Gravity, Urine: 1.023 (ref 1.005–1.030)
pH: 5 (ref 5.0–8.0)

## 2018-12-30 LAB — BASIC METABOLIC PANEL
Anion gap: 8 (ref 5–15)
BUN: 6 mg/dL (ref 6–20)
CO2: 21 mmol/L — ABNORMAL LOW (ref 22–32)
Calcium: 9 mg/dL (ref 8.9–10.3)
Chloride: 109 mmol/L (ref 98–111)
Creatinine, Ser: 0.71 mg/dL (ref 0.44–1.00)
GFR calc Af Amer: >60 mL/min (ref >60)
GFR calc non Af Amer: >60 mL/min (ref >60)
Glucose, Bld: 91 mg/dL (ref 70–99)
Potassium: 3.7 mmol/L (ref 3.5–5.1)
Sodium: 138 mmol/L (ref 135–145)

## 2018-12-30 LAB — CBC
HCT: 40.6 % (ref 36.0–46.0)
Hemoglobin: 13.4 g/dL (ref 12.0–15.0)
MCH: 29 pg (ref 26.0–34.0)
MCHC: 33 g/dL (ref 30.0–36.0)
MCV: 87.9 fL (ref 80.0–100.0)
Platelets: 211 10*3/uL (ref 150–400)
RBC: 4.62 MIL/uL (ref 3.87–5.11)
RDW: 13.8 % (ref 11.5–15.5)
WBC: 4.8 10*3/uL (ref 4.0–10.5)
nRBC: 0 % (ref 0.0–0.2)

## 2018-12-30 LAB — TROPONIN I (HIGH SENSITIVITY): Troponin I (High Sensitivity): <2 ng/L (ref <18)

## 2018-12-30 LAB — POCT PREGNANCY, URINE: Preg Test, Ur: NEGATIVE

## 2018-12-30 MED ORDER — DOXYCYCLINE HYCLATE 50 MG PO CAPS: 100.0000 mg | ORAL_CAPSULE | Freq: Two times a day (BID) | ORAL | 0 refills | Status: AC

## 2018-12-30 MED ORDER — SULFAMETHOXAZOLE-TRIMETHOPRIM 800-160 MG PO TABS
1.0000 | ORAL_TABLET | Freq: Once | ORAL | Status: AC
  Administered 2018-12-30: 23:00:00 1 via ORAL
  Filled 2018-12-30: qty 1

## 2018-12-30 MED ORDER — ONDANSETRON HCL 4 MG/2ML IJ SOLN
4.0000 mg | Freq: Once | INTRAMUSCULAR | Status: AC
  Administered 2018-12-30: 21:00:00 4 mg via INTRAVENOUS
  Filled 2018-12-30: qty 2

## 2018-12-30 MED ORDER — OXYCODONE-ACETAMINOPHEN 5-325 MG PO TABS
1.0000 | ORAL_TABLET | Freq: Once | ORAL | Status: AC
  Administered 2018-12-30: 1 via ORAL
  Filled 2018-12-30: qty 1

## 2018-12-30 MED ORDER — TRAMADOL HCL 50 MG PO TABS: 50.0000 mg | ORAL_TABLET | Freq: Four times a day (QID) | ORAL | 0 refills | Status: DC | PRN

## 2018-12-30 MED ORDER — DOXYCYCLINE HYCLATE 100 MG PO TABS
100.0000 mg | ORAL_TABLET | Freq: Once | ORAL | Status: AC
  Administered 2018-12-30: 100 mg via ORAL
  Filled 2018-12-30: qty 1

## 2018-12-30 MED ORDER — PROMETHAZINE HCL 25 MG/ML IJ SOLN
6.2500 mg | Freq: Once | INTRAMUSCULAR | Status: AC
  Administered 2018-12-30: 23:00:00 6.25 mg via INTRAVENOUS
  Filled 2018-12-30: qty 1

## 2018-12-30 MED ORDER — SULFAMETHOXAZOLE-TRIMETHOPRIM 800-160 MG PO TABS: 1.0000 | ORAL_TABLET | Freq: Two times a day (BID) | ORAL | 0 refills | Status: DC

## 2018-12-30 MED ORDER — ONDANSETRON HCL 4 MG PO TABS: 4.0000 mg | ORAL_TABLET | Freq: Every day | ORAL | 0 refills | Status: DC | PRN

## 2018-12-30 MED ORDER — KETOROLAC TROMETHAMINE 30 MG/ML IJ SOLN
30.0000 mg | Freq: Once | INTRAMUSCULAR | Status: AC
  Administered 2018-12-30: 23:00:00 30 mg via INTRAVENOUS
  Filled 2018-12-30: qty 1

## 2018-12-30 NOTE — ED Provider Notes (Signed)
[facility] Emergency Department Provider Note  ____________________________________________  Time seen: Approximately 8:23 PM  I have reviewed the triage vital signs and the nursing notes.   HISTORY  Chief Complaint Fall and Generalized Body Aches    HPI Kristy Reid is a 23 y.o. female that presents to the emergency department for evaluation of body aches this week and fall this morning.  Patient states that her body hurts everywhere. This morning she was taking a long hot shower to relieve body aches. She was dizzy after getting out of the shower and fell.  She remembers falling to the ground but isnt sure if she lost conciousness. Her sister had to help her get dressed because her body was hurting. Her neck has been hurting since the fall. Her headache has been progressively getting worse. Patient states that her right ear has been hurting for 5+ days.  She is unsure of fevers, but she has been cold. She has also had a couple of episodes of diarrhea the last couple of days. No SOB, CP, abdominal pain.  Past Medical History:  Diagnosis Date  . Anemia   . Anemia   . Anxiety   . Asthma   . Bipolar 1 disorder (HCC)   . Chronic back pain   . Depression   . Insomnia   . Personality disorder (HCC)    "boarderline"  . PTSD (post-traumatic stress disorder)     Patient Active Problem List   Diagnosis Date Noted  . Preterm contractions 06/03/2018  . Anemia affecting pregnancy in third trimester 05/30/2018  . Limited prenatal care in second trimester 05/19/2018  . Depression with suicidal ideation 05/04/2018  . Alcohol abuse 05/04/2018  . Schizoaffective disorder, bipolar type (HCC) 05/04/2018  . Anxiety disorder   . Bipolar 2 disorder (HCC) 11/11/2016  . Noncompliance 11/11/2016  . Severe recurrent major depression with psychotic features (HCC) 07/18/2016  . MDD (major depressive disorder), recurrent, severe, with psychosis (HCC) 07/17/2016  .  Decreased fetal movement 03/22/2016  . Abdominal pain in pregnancy 01/21/2016  . Constipation during pregnancy in second trimester 12/26/2015  . Overdose 11/29/2015  . Supervision of high risk pregnancy, antepartum 11/15/2015  . Cannabis use disorder, severe, dependence (HCC) 11/15/2015  . Tobacco use disorder 11/15/2015  . Asthma 11/15/2015  . Borderline personality disorder (HCC) 11/15/2015  . PTSD (post-traumatic stress disorder) 11/15/2015  . Severe recurrent major depression without psychotic features (HCC) 11/13/2015    Past Surgical History:  Procedure Laterality Date  . BUNIONECTOMY Bilateral    feet  . wrist sugery      Prior to Admission medications   Medication Sig Start Date End Date Taking? Authorizing Provider  acetaminophen (TYLENOL) 500 MG tablet Take 500-1,000 mg by mouth every 6 (six) hours as needed for mild pain or moderate pain.    [provider]  benzonatate (TESSALON PERLES) 100 MG capsule Take 2 capsules (200 mg total) by mouth 3 (three) times daily as needed. 12/13/18 12/13/19  Joni Reining, PA-C  dicyclomine (BENTYL) 20 MG tablet Take 1 tablet (20 mg total) by mouth 3 (three) times daily as needed for spasms. Patient not taking: Reported on 12/03/2018 10/22/18 10/22/19  Miguel Aschoff., MD  doxycycline (VIBRAMYCIN) 50 MG capsule Take 2 capsules (100 mg total) by mouth 2 (two) times daily for 10 days. 12/30/18 01/09/19  Enid Derry, PA-C  fexofenadine-pseudoephedrine (ALLEGRA-D) 60-120 MG 12 hr tablet Take 1 tablet by mouth 2 (two) times daily. 12/13/18  Joni Reining, PA-C  ibuprofen (ADVIL) 200 MG tablet Take 400-800 mg by mouth every 6 (six) hours as needed for fever or headache.    [provider]  ibuprofen (ADVIL) 600 MG tablet Take 1 tablet (600 mg total) by mouth every 8 (eight) hours as needed. 12/13/18   Joni Reining, PA-C  loratadine (CLARITIN) 10 MG tablet Take 1 tablet (10 mg total) by mouth daily. Patient not taking: Reported  on 06/30/2018 05/05/18   [name], [name], MD  metroNIDAZOLE (FLAGYL) 500 MG tablet Take 1 tablet (500 mg total) by mouth 2 (two) times daily for 7 days. 12/23/18 12/30/18  Evon Slack, PA-C  ondansetron (ZOFRAN) 4 MG tablet Take 1 tablet (4 mg total) by mouth daily as needed for nausea or vomiting. 12/30/18 12/30/19  Enid Derry, PA-C  sulfamethoxazole-trimethoprim (BACTRIM DS) 800-160 MG tablet Take 1 tablet by mouth 2 (two) times daily. 12/30/18   Enid Derry, PA-C  traMADol (ULTRAM) 50 MG tablet Take 1 tablet (50 mg total) by mouth every 6 (six) hours as needed. 12/30/18 12/30/19  Enid Derry, PA-C    Allergies Penicillins, Rocephin [ceftriaxone], Zithromax [azithromycin], and Lidocaine  Family History  Problem Relation Age of Onset  . Diabetes Mother   . Hypertension Mother   . Mental illness Mother   . Diabetes Maternal Grandmother   . Heart disease Maternal Grandmother   . Cancer Neg Hx   . Ovarian cancer Neg Hx     Social History Social History   Tobacco Use  . Smoking status: Former Smoker    Types: Cigarettes    Quit date: 08/13/2015    Years since quitting: 3.3  . Smokeless tobacco: Never Used  Substance Use Topics  . Alcohol use: No  . Drug use: Yes    Frequency: 7.0 times per week    Types: Marijuana    Comment: daily use, last use yesterday     Review of Systems  Constitutional: No fever ENT: No upper respiratory complaints. Cardiovascular: No chest pain. Respiratory: No cough. No SOB. Gastrointestinal: No abdominal pain.  No nausea, no vomiting.  Genitourinary: Negative for dysuria. Musculoskeletal: Positive for body aches. Skin: Negative for rash, abrasions, lacerations, ecchymosis. Neurological: Negative for headaches   ____________________________________________   PHYSICAL EXAM:  VITAL SIGNS: ED Triage Vitals  Enc Vitals Group     BP 12/30/18 1809 127/78     Pulse Rate 12/30/18 1809 98     Resp 12/30/18 1809 18     Temp  12/30/18 1809 99.1 F (37.3 C)     Temp Source 12/30/18 1809 Oral     SpO2 12/30/18 1809 97 %     Weight 12/30/18 1809 190 lb (86.2 kg)     Height 12/30/18 1809  (1.575 m)     Head Circumference --      Peak Flow --      Pain Score 12/30/18 1816 10     Pain Loc --      Pain Edu? --      Excl. in GC? --      Constitutional: Alert and oriented. Well appearing and in no acute distress. Eyes: Conjunctivae are normal. PERRL. EOMI. Head: Atraumatic. ENT:      Ears:      Nose: No congestion/rhinnorhea.      Mouth/Throat: Mucous membranes are moist.  Neck: No stridor.  No cervical spine tenderness to palpation. Cardiovascular: Normal rate, regular rhythm.  Good peripheral circulation. Respiratory: Normal respiratory effort  without tachypnea or retractions. Lungs CTAB. Good air entry to the bases with no decreased or absent breath sounds. Gastrointestinal: Bowel sounds 4 quadrants. Soft and nontender to palpation. No guarding or rigidity. No palpable masses. No distention. No CVA tenderness. Musculoskeletal: Full range of motion to all extremities. No gross deformities appreciated. Neurologic:  Normal speech and language. No gross focal neurologic deficits are appreciated.  Skin:  Skin is warm, dry and intact. No rash noted. Psychiatric: Mood and affect are normal. Speech and behavior are normal. Patient exhibits appropriate insight and judgement.   ____________________________________________   LABS (all labs ordered are listed, but only abnormal results are displayed)  Labs Reviewed  BASIC METABOLIC PANEL - Abnormal; Notable for the following components:      Result Value   CO2 21 (*)    All other components within normal limits  URINALYSIS, COMPLETE (UACMP) WITH MICROSCOPIC - Abnormal; Notable for the following components:   Color, Urine YELLOW (*)    APPearance HAZY (*)    Leukocytes,Ua MODERATE (*)    Bacteria, UA RARE (*)    All other components within normal limits   NOVEL CORONAVIRUS, NAA (HOSP ORDER, SEND-OUT TO REF LAB; TAT 18-24 HRS)  CBC  POC URINE PREG, ED  POCT PREGNANCY, URINE  TROPONIN I (HIGH SENSITIVITY)  TROPONIN I (HIGH SENSITIVITY)   ____________________________________________  EKG   ____________________________________________  RADIOLOGY Robinette Haines, personally viewed and evaluated these images (plain radiographs) as part of my medical decision making, as well as reviewing the written report by the radiologist.  Dg Cervical Spine 2-3 Views  Result Date: 12/30/2018 CLINICAL DATA:  Fall in the shower striking head, cervical neck pain. EXAM: CERVICAL SPINE - 2-3 VIEW COMPARISON:  Cervical spine radiograph 09/08/2017 FINDINGS: Cervical spine alignment is maintained. Vertebral body heights and intervertebral disc spaces are preserved. The dens is intact. Posterior elements appear well-aligned. There is no evidence of fracture. No prevertebral soft tissue edema. IMPRESSION: Negative radiographs of the cervical spine. Electronically Signed   By: Keith Rake M.D.   On: 12/30/2018 20:13   Ct Head Wo Contrast  Result Date: 12/30/2018 CLINICAL DATA:  [place] from shower with positive head strike, ongoing body aches EXAM: CT HEAD WITHOUT CONTRAST TECHNIQUE: Contiguous axial images were obtained from the base of the skull through the vertex without intravenous contrast. COMPARISON:  CT head 08/26/2014 FINDINGS: Brain: No evidence of acute infarction, hemorrhage, hydrocephalus, extra-axial collection or mass lesion/mass effect. Vascular: No hyperdense vessel or unexpected calcification. Skull: No calvarial fracture or suspicious osseous lesion. No significant scalp swelling or hematoma. Sinuses/Orbits: Paranasal sinuses and mastoid air cells are predominantly clear. Included orbital structures are unremarkable. Other: None IMPRESSION: No acute intracranial or significant traumatic abnormalities. Electronically Signed   By: Lovena Le M.D.    On: 12/30/2018 22:17    ____________________________________________    PROCEDURES  Procedure(s) performed:    Procedures    Medications  oxyCODONE-acetaminophen (PERCOCET/ROXICET) 5-325 MG per tablet 1 tablet (1 tablet Oral Given 12/30/18 1958)  doxycycline (VIBRA-TABS) tablet 100 mg (100 mg Oral Given 12/30/18 1958)  ondansetron (ZOFRAN) injection 4 mg (4 mg Intravenous Given 12/30/18 2035)  ketorolac (TORADOL) 30 MG/ML injection 30 mg (30 mg Intravenous Given 12/30/18 2237)  promethazine (PHENERGAN) injection 6.25 mg (6.25 mg Intravenous Given 12/30/18 2300)  sulfamethoxazole-trimethoprim (BACTRIM DS) 800-160 MG per tablet 1 tablet (1 tablet Oral Given 12/30/18 2301)     ____________________________________________   INITIAL IMPRESSION / ASSESSMENT AND PLAN / ED COURSE  Pertinent labs & imaging results that were available during my care of the patient were reviewed by me and considered in my medical decision making (see chart for details).  Review of the [place] CSRS was performed in accordance of the [facility] prior to dispensing any controlled drugs.   Patient's diagnosis is consistent with otitis media and acute cystitis.  Vital signs and exam are reassuring. Patient has a low grade fever in ED. Head CT negative for acute abnormalities.  Neck x-ray negative for acute bony abnormalities.  Lab work all reassuring.  Exam is consistent with right otitis media.  Urinalysis consistent with infection.  Patient is allergic to penicillins.  She is not sure of her allergy.  She will be started on doxycycline for ear infection and Bactrim for UTI.  COVID test sent. Patient likely fell this morning due to heat of the shower and not drinking enough. Patient is drinking fluids in the ER. Patient will be discharged home with prescriptions for doxycycline and Bactrim. Patient is to follow up with primary care as directed. Patient is given ED precautions to return to the ED for any worsening or new  symptoms.   Deandra Donnald GarreJanae Pressey was evaluated in Emergency Department on 12/30/2018 for the symptoms described in the history of present illness. She was evaluated in the context of the global COVID-19 pandemic, which necessitated consideration that the patient might be at risk for infection with the SARS-CoV-2 virus that causes COVID-19. Institutional protocols and algorithms that pertain to the evaluation of patients at risk for COVID-19 are in a state of rapid change based on information released by regulatory bodies including the CDC and federal and state organizations. These policies and algorithms were followed during the patient's care in the ED.  ____________________________________________  FINAL CLINICAL IMPRESSION(S) / ED DIAGNOSES  Final diagnoses:  Fall, initial encounter  Body aches  Acute cystitis without hematuria  Acute otitis media, unspecified otitis media type  Encounter for screening laboratory testing for COVID-19 virus      NEW MEDICATIONS STARTED DURING THIS VISIT:  ED Discharge Orders         Ordered    doxycycline (VIBRAMYCIN) 50 MG capsule  2 times daily     12/30/18 2254    sulfamethoxazole-trimethoprim (BACTRIM DS) 800-160 MG tablet  2 times daily     12/30/18 2254    ondansetron (ZOFRAN) 4 MG tablet  Daily PRN     12/30/18 2254    traMADol (ULTRAM) 50 MG tablet  Every 6 hours PRN     12/30/18 2254              This chart was dictated using voice recognition software/Dragon. Despite best efforts to proofread, errors can occur which can change the meaning. Any change was purely unintentional.    Enid DerryWagner, Sartaj Hoskin, PA-C 12/30/18 2344    Emily FilbertWilliams, Jonathan E, MD 12/30/18 [phone]

## 2018-12-30 NOTE — ED Triage Notes (Signed)
Pt to ED via [facility]. Pt states that she was in the shower and fell out hitting her head. Pt states that she has been having body aches and that she was getting in the shower to try to help with the body aches. Pt states that she is having pain in her head and neck. Pt is in NAD.

## 2018-12-30 NOTE — Discharge Instructions (Addendum)
Your head CT and your neck x-ray was normal.  Your lab work is all very reassuring.  You have a right ear infection.  You also have a urinary tract infection.  Please take doxycycline for your ear infection and Bactrim for your urinary tract infection.  You can take Zofran for nausea.  You can take tramadol for body aches.  Please drink plenty of fluids.

## 2018-12-30 NOTE — ED Notes (Signed)
Pt from triage via wheelchair c/o body aches since this morning; states she got so dizzy this morning she fell and hit her head and hurt her neck getting out of the shower. PT A&Ox4

## 2018-12-30 NOTE — ED Notes (Signed)
Peripheral IV discontinued. Catheter intact. No signs of infiltration or redness. Gauze applied to IV site.    Discharge instructions reviewed with patient. Questions fielded by this RN. Patient verbalizes understanding of instructions. Patient discharged home in stable condition per provider. No acute distress noted at time of discharge.   Pt nausea improved and ambulatory to DC

## 2018-12-30 NOTE — ED Notes (Signed)
Provider at bedside

## 2019-01-02 ENCOUNTER — Telehealth: Payer: Self-pay | Admitting: Emergency Medicine

## 2019-01-02 LAB — NOVEL CORONAVIRUS, NAA (HOSP ORDER, SEND-OUT TO REF LAB; TAT 18-24 HRS): SARS-CoV-2, NAA: DETECTED — AB

## 2019-01-02 NOTE — Telephone Encounter (Addendum)
Called patient to make sure she saw positive covid result in mychart.  Left message asking her to call me if she is unable to see her result or if she has any questions.     Patient called me back and I answered her questions.  She needs a copy of result for her employer because they did not accept the screenshot from her mychart.  I will email to her.

## 2019-01-03 NOTE — Progress Notes (Signed)
I agree with results.

## 2019-02-01 ENCOUNTER — Emergency Department
Admission: EM | Admit: 2019-02-01 | Discharge: 2019-02-01 | Disposition: A | Discharge status: Home / Self Care | Payer: Medicaid - Out of State | Attending: Emergency Medicine | Admitting: Emergency Medicine

## 2019-02-01 ENCOUNTER — Other Ambulatory Visit: Payer: Self-pay

## 2019-02-01 ENCOUNTER — Emergency Department: Payer: Medicaid - Out of State

## 2019-02-01 ENCOUNTER — Encounter: Payer: Self-pay | Admitting: Emergency Medicine

## 2019-02-01 DIAGNOSIS — Z79899 Other long term (current) drug therapy: Secondary | ICD-10-CM | POA: Diagnosis not present

## 2019-02-01 DIAGNOSIS — J069 Acute upper respiratory infection, unspecified: Secondary | ICD-10-CM | POA: Diagnosis not present

## 2019-02-01 DIAGNOSIS — R509 Fever, unspecified: Secondary | ICD-10-CM | POA: Diagnosis present

## 2019-02-01 DIAGNOSIS — Z87891 Personal history of nicotine dependence: Secondary | ICD-10-CM | POA: Diagnosis not present

## 2019-02-01 DIAGNOSIS — J45909 Unspecified asthma, uncomplicated: Secondary | ICD-10-CM | POA: Insufficient documentation

## 2019-02-01 MED ORDER — BENZONATATE 100 MG PO CAPS: 200.0000 mg | ORAL_CAPSULE | Freq: Three times a day (TID) | ORAL | 0 refills | Status: DC | PRN

## 2019-02-01 MED ORDER — IBUPROFEN 600 MG PO TABS: 600.0000 mg | ORAL_TABLET | Freq: Three times a day (TID) | ORAL | 0 refills | Status: DC | PRN

## 2019-02-01 MED ORDER — FEXOFENADINE-PSEUDOEPHED ER 60-120 MG PO TB12: 1.0000 | ORAL_TABLET | Freq: Two times a day (BID) | ORAL | 0 refills | Status: DC

## 2019-02-01 NOTE — Discharge Instructions (Signed)
Follow discharge care instruction self quarantine as directed.

## 2019-02-01 NOTE — ED Notes (Addendum)
FIRST NURSE note: pt c/o cough and body aches. Covid + on 10/16. States feels like her symptoms are returning. PT ambulatory, no resp distress noted

## 2019-02-01 NOTE — ED Provider Notes (Signed)
[facility] Emergency Department Provider Note   ____________________________________________   First MD Initiated Contact with Patient 02/01/19 1317     (approximate)  I have reviewed the triage vital signs and the nursing notes.   HISTORY  Chief Complaint No chief complaint on file.    HPI Kristy Reid is a 23 y.o. female patient complain of fever, cough, body aches, chest congestion.  Patient tested Covid positive on 01/02/2019.  Patient states started feeling better after her quarantine period of 14 days.  Patient states the last 3 days increasing cough, body aches, and intermittent fever.  Patient denies nausea, vomiting, diarrhea.  Patient states nasal congestion intermittent rhinorrhea.  Patient rates the pain discomfort as a 6/3.  Patient describes her pain is "achy".  No palliative measures for complaint.         Past Medical History:  Diagnosis Date  . Anemia   . Anemia   . Anxiety   . Asthma   . Bipolar 1 disorder (HCC)   . Chronic back pain   . Depression   . Insomnia   . Personality disorder (HCC)    "boarderline"  . PTSD (post-traumatic stress disorder)     Patient Active Problem List   Diagnosis Date Noted  . Preterm contractions 06/03/2018  . Anemia affecting pregnancy in third trimester 05/30/2018  . Limited prenatal care in second trimester 05/19/2018  . Depression with suicidal ideation 05/04/2018  . Alcohol abuse 05/04/2018  . Schizoaffective disorder, bipolar type (HCC) 05/04/2018  . Anxiety disorder   . Bipolar 2 disorder (HCC) 11/11/2016  . Noncompliance 11/11/2016  . Severe recurrent major depression with psychotic features (HCC) 07/18/2016  . MDD (major depressive disorder), recurrent, severe, with psychosis (HCC) 07/17/2016  . Decreased fetal movement 03/22/2016  . Abdominal pain in pregnancy 01/21/2016  . Constipation during pregnancy in second trimester 12/26/2015  . Overdose 11/29/2015  . Supervision  of high risk pregnancy, antepartum 11/15/2015  . Cannabis use disorder, severe, dependence (HCC) 11/15/2015  . Tobacco use disorder 11/15/2015  . Asthma 11/15/2015  . Borderline personality disorder (HCC) 11/15/2015  . PTSD (post-traumatic stress disorder) 11/15/2015  . Severe recurrent major depression without psychotic features (HCC) 11/13/2015    Past Surgical History:  Procedure Laterality Date  . BUNIONECTOMY Bilateral    feet  . wrist sugery      Prior to Admission medications   Medication Sig Start Date End Date Taking? Authorizing Provider  acetaminophen (TYLENOL) 500 MG tablet Take 500-1,000 mg by mouth every 6 (six) hours as needed for mild pain or moderate pain.    [provider]  benzonatate (TESSALON PERLES) 100 MG capsule Take 2 capsules (200 mg total) by mouth 3 (three) times daily as needed. 12/13/18 12/13/19  Joni Reining, PA-C  benzonatate (TESSALON PERLES) 100 MG capsule Take 2 capsules (200 mg total) by mouth 3 (three) times daily as needed. 02/01/19 02/01/20  Joni Reining, PA-C  dicyclomine (BENTYL) 20 MG tablet Take 1 tablet (20 mg total) by mouth 3 (three) times daily as needed for spasms. Patient not taking: Reported on 12/03/2018 10/22/18 10/22/19  Miguel Aschoff., MD  fexofenadine-pseudoephedrine (ALLEGRA-D) 60-120 MG 12 hr tablet Take 1 tablet by mouth 2 (two) times daily. 12/13/18   Joni Reining, PA-C  fexofenadine-pseudoephedrine (ALLEGRA-D) 60-120 MG 12 hr tablet Take 1 tablet by mouth 2 (two) times daily. 02/01/19   Joni Reining, PA-C  ibuprofen (ADVIL) 200 MG tablet Take 400-800 mg  by mouth every 6 (six) hours as needed for fever or headache.    [provider]  ibuprofen (ADVIL) 600 MG tablet Take 1 tablet (600 mg total) by mouth every 8 (eight) hours as needed. 12/13/18   Sable Feil, PA-C  ibuprofen (ADVIL) 600 MG tablet Take 1 tablet (600 mg total) by mouth every 8 (eight) hours as needed. 02/01/19   Sable Feil, PA-C   loratadine (CLARITIN) 10 MG tablet Take 1 tablet (10 mg total) by mouth daily. Patient not taking: Reported on 06/30/2018 05/05/18   [name], Madie Reno, MD  ondansetron (ZOFRAN) 4 MG tablet Take 1 tablet (4 mg total) by mouth daily as needed for nausea or vomiting. 12/30/18 12/30/19  Laban Emperor, PA-C  sulfamethoxazole-trimethoprim (BACTRIM DS) 800-160 MG tablet Take 1 tablet by mouth 2 (two) times daily. 12/30/18   Laban Emperor, PA-C  traMADol (ULTRAM) 50 MG tablet Take 1 tablet (50 mg total) by mouth every 6 (six) hours as needed. 12/30/18 12/30/19  Laban Emperor, PA-C    Allergies Penicillins, Rocephin [ceftriaxone], Zithromax [azithromycin], and Lidocaine  Family History  Problem Relation Age of Onset  . Diabetes Mother   . Hypertension Mother   . Mental illness Mother   . Diabetes Maternal Grandmother   . Heart disease Maternal Grandmother   . Cancer Neg Hx   . Ovarian cancer Neg Hx     Social History Social History   Tobacco Use  . Smoking status: Former Smoker    Types: Cigarettes    Quit date: 08/13/2015    Years since quitting: 3.4  . Smokeless tobacco: Never Used  Substance Use Topics  . Alcohol use: No  . Drug use: Yes    Frequency: 7.0 times per week    Types: Marijuana    Comment: daily use, last use yesterday    Review of Systems Constitutional: No fever/chills.  Fatigue and body aches. Eyes: No visual changes. ENT: No sore throat.  Nasal congestion with intimating rhinorrhea. Cardiovascular: Denies chest pain. Respiratory: Denies shortness of breath.  Nonproductive cough. Gastrointestinal: No abdominal pain.  No nausea, no vomiting.  No diarrhea.  No constipation. Genitourinary: Negative for dysuria. Musculoskeletal: Negative for back pain. Skin: Negative for rash. Neurological: Negative for headaches, focal weakness or numbness. Psychiatric:  Anxiety, bipolar, depression, and PTSD. Allergic/Immunilogical: Penicillin, Rocephin, Zithromax, and  lidocaine. ____________________________________________   PHYSICAL EXAM:  VITAL SIGNS: ED Triage Vitals  Enc Vitals Group     BP 02/01/19 1306 127/69     Pulse Rate 02/01/19 1306 (!) 103     Resp 02/01/19 1306 16     Temp 02/01/19 1306 98.9 F (37.2 C)     Temp Source 02/01/19 1306 Oral     SpO2 02/01/19 1306 98 %     Weight --      Height --      Head Circumference --      Peak Flow --      Pain Score 02/01/19 1255 6     Pain Loc --      Pain Edu? --      Excl. in [place]? --    Constitutional: Alert and oriented. Well appearing and in no acute distress. Eyes: Conjunctivae are normal. PERRL. EOMI. Head: Atraumatic. Nose: Edematous nasal turbinates clear rhinorrhea. Mouth/Throat: Mucous membranes are moist.  Oropharynx non-erythematous.  Postnasal drainage. Neck: No stridor.   Cardiovascular: Normal rate, regular rhythm. Grossly normal heart sounds.  Good peripheral circulation. Respiratory: Normal respiratory effort.  No retractions. Lungs CTAB. Gastrointestinal: Soft and nontender. No distention. No abdominal bruits. No CVA tenderness. Genitourinary: Deferred Skin:  Skin is warm, dry and intact. No rash noted. Psychiatric: Mood and affect are normal. Speech and behavior are normal.  ____________________________________________   LABS (all labs ordered are listed, but only abnormal results are displayed)  Labs Reviewed - No data to display ____________________________________________  EKG   ____________________________________________  RADIOLOGY  ED MD interpretation:    Official radiology report(s): Dg Chest Portable 1 View  Result Date: 02/01/2019 CLINICAL DATA:  Covid positive, chest congestion EXAM: PORTABLE CHEST 1 VIEW COMPARISON:  December 28, 2018 FINDINGS: The heart size and mediastinal contours are within normal limits. Both lungs are clear. The visualized skeletal structures are unremarkable. IMPRESSION: No active disease. Electronically Signed   By:  Jonna ClarkBindu  Avutu M.D.   On: 02/01/2019 13:43    ____________________________________________   PROCEDURES  Procedure(s) performed (including Critical Care):  Procedures   ____________________________________________   INITIAL IMPRESSION / ASSESSMENT AND PLAN / ED COURSE  As part of my medical decision making, I reviewed the following data within the electronic MEDICAL RECORD NUMBER     Patient presents with fever, cough, body aches.  Patient was diagnosed with COVID-19 on 01/02/2019.  Patient denies recent travel or known exposure to COVID-19.  Patient physical exam is consistent with a upper respiratory infection.  Discussed negative chest x-ray findings with patient.  Patient given discharge care instruction work note.  Patient advised take medication as directed.  Patient advised [facility]   Kristy Reid was evaluated in Emergency Department on 02/01/2019 for the symptoms described in the history of present illness. She was evaluated in the context of the global COVID-19 pandemic, which necessitated consideration that the patient might be at risk for infection with the SARS-CoV-2 virus that causes COVID-19. Institutional protocols and algorithms that pertain to the evaluation of patients at risk for COVID-19 are in a state of rapid change based on information released by regulatory bodies including the CDC and federal and state organizations. These policies and algorithms were followed during the patient's care in the ED.       ____________________________________________   FINAL CLINICAL IMPRESSION(S) / ED DIAGNOSES  Final diagnoses:  Viral upper respiratory illness     ED Discharge Orders         Ordered    fexofenadine-pseudoephedrine (ALLEGRA-D) 60-120 MG 12 hr tablet  2 times daily     02/01/19 1356    ibuprofen (ADVIL) 600 MG tablet  Every 8 hours PRN     02/01/19 1356    benzonatate (TESSALON PERLES) 100 MG capsule  3 times daily PRN      02/01/19 1356           Note:  This document was prepared using Dragon voice recognition software and may include unintentional dictation errors.    Joni ReiningSmith, Wynn Kernes K, PA-C 02/01/19 1401    Shaune PollackIsaacs, [place], MD 02/02/19 [phone]

## 2019-03-12 ENCOUNTER — Other Ambulatory Visit: Payer: Self-pay

## 2019-03-12 ENCOUNTER — Encounter: Payer: Self-pay | Admitting: Emergency Medicine

## 2019-03-12 DIAGNOSIS — Z5321 Procedure and treatment not carried out due to patient leaving prior to being seen by health care provider: Secondary | ICD-10-CM | POA: Insufficient documentation

## 2019-03-12 DIAGNOSIS — R55 Syncope and collapse: Secondary | ICD-10-CM | POA: Diagnosis present

## 2019-03-12 LAB — URINALYSIS, COMPLETE (UACMP) WITH MICROSCOPIC
Bilirubin Urine: NEGATIVE
Glucose, UA: NEGATIVE mg/dL
Hgb urine dipstick: NEGATIVE
Ketones, ur: NEGATIVE mg/dL
Nitrite: NEGATIVE
Protein, ur: 30 mg/dL — AB
Specific Gravity, Urine: 1.026 (ref 1.005–1.030)
pH: 5 (ref 5.0–8.0)

## 2019-03-12 LAB — BASIC METABOLIC PANEL
Anion gap: 8 (ref 5–15)
BUN: 6 mg/dL (ref 6–20)
CO2: 23 mmol/L (ref 22–32)
Calcium: 9.1 mg/dL (ref 8.9–10.3)
Chloride: 108 mmol/L (ref 98–111)
Creatinine, Ser: 0.81 mg/dL (ref 0.44–1.00)
GFR calc Af Amer: >60 mL/min (ref >60)
GFR calc non Af Amer: >60 mL/min (ref >60)
Glucose, Bld: 114 mg/dL — ABNORMAL HIGH (ref 70–99)
Potassium: 3.9 mmol/L (ref 3.5–5.1)
Sodium: 139 mmol/L (ref 135–145)

## 2019-03-12 LAB — POCT PREGNANCY, URINE: Preg Test, Ur: NEGATIVE

## 2019-03-12 LAB — CBC
HCT: 40.8 % (ref 36.0–46.0)
Hemoglobin: 13.8 g/dL (ref 12.0–15.0)
MCH: 29.9 pg (ref 26.0–34.0)
MCHC: 33.8 g/dL (ref 30.0–36.0)
MCV: 88.3 fL (ref 80.0–100.0)
Platelets: 242 10*3/uL (ref 150–400)
RBC: 4.62 MIL/uL (ref 3.87–5.11)
RDW: 14.6 % (ref 11.5–15.5)
WBC: 6.5 10*3/uL (ref 4.0–10.5)
nRBC: 0 % (ref 0.0–0.2)

## 2019-03-12 MED ORDER — SODIUM CHLORIDE 0.9% FLUSH: 3.0000 mL | Freq: Once | INTRAVENOUS | Status: DC

## 2019-03-12 NOTE — ED Triage Notes (Signed)
First Nurse Note:  C/o dizziness today with a syncopal event earlier this morning.  Patient is AAOx3.  Skin warm and dry.  MAE equally and strong.  Gait steady.  NAD

## 2019-03-12 NOTE — ED Triage Notes (Signed)
Pt via POV from home that has been having dizziness since 0630 this am. Pt states she has a syncopal episode at 0830 and hit her head on the bed frame. Pt states she is extremely fatigue and she couldn't get the energy to get out of the bed. Pt currently c/o dizziness and headache.   Pt A&Ox4 and NAD at this time.

## 2019-03-13 ENCOUNTER — Emergency Department
Admission: EM | Admit: 2019-03-13 | Discharge: 2019-03-13 | Disposition: A | Discharge status: Home / Self Care | Payer: Medicaid - Out of State | Attending: Emergency Medicine | Admitting: Emergency Medicine

## 2019-03-13 LAB — TROPONIN I (HIGH SENSITIVITY): Troponin I (High Sensitivity): 3 ng/L (ref <18)

## 2019-03-13 NOTE — ED Notes (Signed)
Informed by registration that pt has left.  

## 2019-05-18 NOTE — Addendum Note (Signed)
Addendum  created 05/18/19 1349 by Sadie Haber, MD    Attestation recorded in Intraprocedure, Cosign clinical note, Flowsheet accepted, Intraprocedure Attestations filed

## 2019-06-03 ENCOUNTER — Encounter (HOSPITAL_COMMUNITY): Payer: Self-pay | Admitting: Emergency Medicine

## 2019-06-03 ENCOUNTER — Emergency Department (HOSPITAL_COMMUNITY): Payer: Medicaid Other

## 2019-06-03 ENCOUNTER — Other Ambulatory Visit: Payer: Self-pay

## 2019-06-03 ENCOUNTER — Emergency Department (HOSPITAL_COMMUNITY)
Admission: EM | Admit: 2019-06-03 | Discharge: 2019-06-03 | Disposition: A | Discharge status: Home / Self Care | Payer: Medicaid Other | Attending: Emergency Medicine | Admitting: Emergency Medicine

## 2019-06-03 DIAGNOSIS — Z87891 Personal history of nicotine dependence: Secondary | ICD-10-CM | POA: Diagnosis not present

## 2019-06-03 DIAGNOSIS — I10 Essential (primary) hypertension: Secondary | ICD-10-CM | POA: Insufficient documentation

## 2019-06-03 DIAGNOSIS — R0789 Other chest pain: Secondary | ICD-10-CM

## 2019-06-03 DIAGNOSIS — R079 Chest pain, unspecified: Secondary | ICD-10-CM | POA: Diagnosis present

## 2019-06-03 HISTORY — DX: Essential (primary) hypertension: I10

## 2019-06-03 LAB — BASIC METABOLIC PANEL
Anion gap: 9 (ref 5–15)
BUN: 7 mg/dL (ref 6–20)
CO2: 21 mmol/L — ABNORMAL LOW (ref 22–32)
Calcium: 9.3 mg/dL (ref 8.9–10.3)
Chloride: 107 mmol/L (ref 98–111)
Creatinine, Ser: 0.87 mg/dL (ref 0.44–1.00)
GFR calc Af Amer: >60 mL/min (ref >60)
GFR calc non Af Amer: >60 mL/min (ref >60)
Glucose, Bld: 117 mg/dL — ABNORMAL HIGH (ref 70–99)
Potassium: 3.8 mmol/L (ref 3.5–5.1)
Sodium: 137 mmol/L (ref 135–145)

## 2019-06-03 LAB — TROPONIN I (HIGH SENSITIVITY): Troponin I (High Sensitivity): <2 ng/L (ref <18)

## 2019-06-03 LAB — CBC
HCT: 40 % (ref 36.0–46.0)
Hemoglobin: 12.9 g/dL (ref 12.0–15.0)
MCH: 29.9 pg (ref 26.0–34.0)
MCHC: 32.3 g/dL (ref 30.0–36.0)
MCV: 92.8 fL (ref 80.0–100.0)
Platelets: 261 10*3/uL (ref 150–400)
RBC: 4.31 MIL/uL (ref 3.87–5.11)
RDW: 14.4 % (ref 11.5–15.5)
WBC: 7.5 10*3/uL (ref 4.0–10.5)
nRBC: 0 % (ref 0.0–0.2)

## 2019-06-03 LAB — I-STAT BETA HCG BLOOD, ED (MC, WL, AP ONLY): I-stat hCG, quantitative: <5 m[IU]/mL (ref <5)

## 2019-06-03 MED ORDER — KETOROLAC TROMETHAMINE 15 MG/ML IJ SOLN
15.0000 mg | Freq: Once | INTRAMUSCULAR | Status: AC
  Administered 2019-06-03: 15 mg via INTRAVENOUS
  Filled 2019-06-03: qty 1

## 2019-06-03 MED ORDER — SODIUM CHLORIDE 0.9% FLUSH: 3.0000 mL | Freq: Once | INTRAVENOUS | Status: DC

## 2019-06-03 NOTE — ED Provider Notes (Signed)
[facility] EMERGENCY DEPARTMENT Provider Note   CSN: [id number] Arrival date & time: 06/03/19  1816     History Chief Complaint  Patient presents with  . Chest Pain    Kristy Reid is a 24 y.o. female.  HPI   Patient presents with concern for chest pain.  She notes that she has pain described as sharp, radiating down the right arm, and the chest.  Pain is been present for about 3 months, and she has been seen and evaluated at another emergency department, and by her primary care physician. With concern for this, as well as medical issues, behavioral health issues, patient started a new medication yesterday, metoprolol, 25 mg, twice daily. Today, after speaking with her physician, describing ongoing discomfort she was sent here for evaluation.  She denies other new complaints including syncope, other pain, dyspnea.  No clear alleviating or exacerbating factors.  She states that she may have had some cardiac pathology as a child, but cannot recall specific diagnosis.  She also notes a family history of some cardiac issues, but no specifics. Patient states that she has been taking all of her other medication including multiple behavioral health pharmaceuticals regularly, without recent changes.  Past Medical History:  Diagnosis Date  . Anemia   . Anemia   . Anxiety   . Asthma   . Bipolar 1 disorder (HCC)   . Chronic back pain   . Depression   . Hypertension   . Insomnia   . Personality disorder (HCC)    "boarderline"  . PTSD (post-traumatic stress disorder)     Patient Active Problem List   Diagnosis Date Noted  . Preterm contractions 06/03/2018  . Anemia affecting pregnancy in third trimester 05/30/2018  . Limited prenatal care in second trimester 05/19/2018  . Depression with suicidal ideation 05/04/2018  . Alcohol abuse 05/04/2018  . Schizoaffective disorder, bipolar type (HCC) 05/04/2018  . Anxiety disorder   . Bipolar 2 disorder (HCC)  11/11/2016  . Noncompliance 11/11/2016  . Severe recurrent major depression with psychotic features (HCC) 07/18/2016  . MDD (major depressive disorder), recurrent, severe, with psychosis (HCC) 07/17/2016  . Decreased fetal movement 03/22/2016  . Abdominal pain in pregnancy 01/21/2016  . Constipation during pregnancy in second trimester 12/26/2015  . Overdose 11/29/2015  . Supervision of high risk pregnancy, antepartum 11/15/2015  . Cannabis use disorder, severe, dependence (HCC) 11/15/2015  . Tobacco use disorder 11/15/2015  . Asthma 11/15/2015  . Borderline personality disorder (HCC) 11/15/2015  . PTSD (post-traumatic stress disorder) 11/15/2015  . Severe recurrent major depression without psychotic features (HCC) 11/13/2015    Past Surgical History:  Procedure Laterality Date  . BUNIONECTOMY Bilateral    feet  . wrist sugery       OB History    Gravida  4   Para  2   Term  2   Preterm  0   AB  1   Living  2     SAB  1   TAB  0   Ectopic  0   Multiple  0   Live Births  2           Family History  Problem Relation Age of Onset  . Diabetes Mother   . Hypertension Mother   . Mental illness Mother   . Diabetes Maternal Grandmother   . Heart disease Maternal Grandmother   . Cancer Neg Hx   . Ovarian cancer Neg Hx  Social History   Tobacco Use  . Smoking status: Former Smoker    Types: Cigarettes    Quit date: 08/13/2015    Years since quitting: 3.8  . Smokeless tobacco: Never Used  Substance Use Topics  . Alcohol use: No  . Drug use: Yes    Frequency: 7.0 times per week    Types: Marijuana    Comment: daily use, last use yesterday    Home Medications Prior to Admission medications   Medication Sig Start Date End Date Taking? Authorizing Provider  acetaminophen (TYLENOL) 500 MG tablet Take 500-1,000 mg by mouth every 6 (six) hours as needed for mild pain or moderate pain.   Yes [provider]  albuterol (VENTOLIN HFA) 108 (90  Base) MCG/ACT inhaler Inhale 2 puffs into the lungs every 6 (six) hours as needed for wheezing or shortness of breath.   Yes [provider]  citalopram (CELEXA) 10 MG tablet Take 10 mg by mouth at bedtime.   Yes [provider]  fluticasone (FLONASE) 50 MCG/ACT nasal spray Place 1 spray into both nostrils daily.   Yes [provider]  hydrOXYzine (ATARAX/VISTARIL) 10 MG tablet Take 10 mg by mouth 3 (three) times daily as needed for itching or anxiety.   Yes [provider]  ibuprofen (ADVIL) 200 MG tablet Take 400-800 mg by mouth every 6 (six) hours as needed for fever or headache.   Yes [provider]  metoprolol tartrate (LOPRESSOR) 25 MG tablet Take 25 mg by mouth 2 (two) times daily.   Yes [provider]  QUEtiapine (SEROQUEL) 300 MG tablet Take 300 mg by mouth at bedtime.   Yes [provider]  traZODone (DESYREL) 150 MG tablet Take 150 mg by mouth at bedtime.   Yes [provider]  benzonatate (TESSALON PERLES) 100 MG capsule Take 2 capsules (200 mg total) by mouth 3 (three) times daily as needed. Patient not taking: Reported on 06/03/2019 12/13/18 12/13/19  Joni Reining, PA-C  benzonatate (TESSALON PERLES) 100 MG capsule Take 2 capsules (200 mg total) by mouth 3 (three) times daily as needed. Patient not taking: Reported on 06/03/2019 02/01/19 02/01/20  Joni Reining, PA-C  dicyclomine (BENTYL) 20 MG tablet Take 1 tablet (20 mg total) by mouth 3 (three) times daily as needed for spasms. Patient not taking: Reported on 12/03/2018 10/22/18 10/22/19  Miguel Aschoff., MD  fexofenadine-pseudoephedrine (ALLEGRA-D) 60-120 MG 12 hr tablet Take 1 tablet by mouth 2 (two) times daily. Patient not taking: Reported on 06/03/2019 12/13/18   Joni Reining, PA-C  fexofenadine-pseudoephedrine (ALLEGRA-D) 60-120 MG 12 hr tablet Take 1 tablet by mouth 2 (two) times daily. Patient not taking: Reported on 06/03/2019 02/01/19   Joni Reining,  PA-C  ibuprofen (ADVIL) 600 MG tablet Take 1 tablet (600 mg total) by mouth every 8 (eight) hours as needed. Patient not taking: Reported on 06/03/2019 12/13/18   Joni Reining, PA-C  ibuprofen (ADVIL) 600 MG tablet Take 1 tablet (600 mg total) by mouth every 8 (eight) hours as needed. Patient not taking: Reported on 06/03/2019 02/01/19   Joni Reining, PA-C  loratadine (CLARITIN) 10 MG tablet Take 1 tablet (10 mg total) by mouth daily. Patient not taking: Reported on 06/30/2018 05/05/18   [name], [name], MD  ondansetron (ZOFRAN) 4 MG tablet Take 1 tablet (4 mg total) by mouth daily as needed for nausea or vomiting. Patient not taking: Reported on 06/03/2019 12/30/18 12/30/19  Enid Derry, PA-C  sulfamethoxazole-trimethoprim (BACTRIM DS) 800-160 MG tablet Take 1 tablet by mouth 2 (two) times daily. Patient not taking: Reported on 06/03/2019 12/30/18   Enid Derry, PA-C  traMADol (ULTRAM) 50 MG tablet Take 1 tablet (50 mg total) by mouth every 6 (six) hours as needed. Patient not taking: Reported on 06/03/2019 12/30/18 12/30/19  Enid Derry, PA-C    Allergies    Penicillins, Rocephin [ceftriaxone], Zithromax [azithromycin], and Lidocaine  Review of Systems   Review of Systems  Constitutional:       Per HPI, otherwise negative  HENT:       Per HPI, otherwise negative  Respiratory:       Per HPI, otherwise negative  Cardiovascular:       Per HPI, otherwise negative  Gastrointestinal: Negative for vomiting.  Endocrine:       Negative aside from HPI  Genitourinary:       Neg aside from HPI   Musculoskeletal:       Per HPI, otherwise negative  Skin: Negative.   Neurological: Negative for syncope.  Psychiatric/Behavioral:       Per HPI otherwise negative    Physical Exam Updated Vital Signs BP (!) 107/55   Pulse 70   Temp 98.8 F (37.1 C) (Oral)   Resp 20   LMP 05/20/2019   SpO2 100%   Physical Exam Vitals and nursing note reviewed.  Constitutional:      General:  She is not in acute distress.    Appearance: She is well-developed.  HENT:     Head: Normocephalic and atraumatic.  Eyes:     Conjunctiva/sclera: Conjunctivae normal.  Cardiovascular:     Rate and Rhythm: Normal rate and regular rhythm.     Comments: Patient occasionally tachycardic, there was sinus tachycardia, but most of the initial evaluation patient is sinus rhythm, rate in the 90s. Pulmonary:     Effort: Pulmonary effort is normal. No respiratory distress.     Breath sounds: Normal breath sounds. No stridor.  Abdominal:     General: There is no distension.  Skin:    General: Skin is warm and dry.  Neurological:     Mental Status: She is alert and oriented to person, place, and time.     Cranial Nerves: No cranial nerve deficit.     ED Results / Procedures / Treatments   Labs (all labs ordered are listed, but only abnormal results are displayed) Labs Reviewed  BASIC METABOLIC PANEL - Abnormal; Notable for the following components:      Result Value   CO2 21 (*)    Glucose, Bld 117 (*)    All other components within normal limits  CBC  I-STAT BETA HCG BLOOD, ED ([facility], [facility], [facility] ONLY)  TROPONIN I (HIGH SENSITIVITY)  TROPONIN I (HIGH SENSITIVITY)    EKG EKG Interpretation  Date/Time:  Saturday June 03 2019 18:24:03 EDT Ventricular Rate:  118 PR Interval:  130 QRS Duration: 74 QT Interval:  312 QTC Calculation: 437 R Axis:   97 Text Interpretation: Sinus tachycardia Rightward axis Borderline ECG Confirmed by Gerhard Munch [phone]) on 06/03/2019 6:36:28 PM   Radiology DG Chest 2 View  Result Date: 06/03/2019 CLINICAL DATA:  Chest pain. Shortness of breath. EXAM: CHEST - 2 VIEW COMPARISON:  Chest radiograph 02/01/2019 FINDINGS: The heart size and mediastinal contours are within normal limits. The lungs are clear. No pneumothorax or pleural effusion. The visualized skeletal structures are unremarkable. IMPRESSION: No active cardiopulmonary disease. Electronically Signed    By:  Audie Pinto M.D.   On: 06/03/2019 19:12    Procedures Procedures (including critical care time)  Medications Ordered in ED Medications  sodium chloride flush (NS) 0.9 % injection 3 mL (has no administration in time range)  ketorolac (TORADOL) 15 MG/ML injection 15 mg (has no administration in time range)    ED Course  I have reviewed the triage vital signs and the nursing notes.  Pertinent labs & imaging results that were available during my care of the patient were reviewed by me and considered in my medical decision making (see chart for details).  After the initial evaluation I reviewed the patient's chart including documentation from ED visit at our affiliated center last year.    9:41 PM Patient in no distress, awake, alert, hemodynamically unremarkable. We discussed all findings, reassuring results, labs, x-ray, nonischemic EKG, low suspicion for atypical ACS.  Given the chronicity of the symptoms, some suspicion for musculoskeletal etiology. Patient received Toradol, will continue anti-inflammatories at home, with follow-up with primary care. Final Clinical Impression(s) / ED Diagnoses Final diagnoses:  Atypical chest pain     Carmin Muskrat, MD 06/03/19 2144

## 2019-06-03 NOTE — ED Triage Notes (Signed)
C/o pain to center of chest that radiates to R am and leg with intermittent nausea and SOB since December.

## 2019-06-03 NOTE — Discharge Instructions (Addendum)
As discussed, your evaluation today has been largely reassuring.  But, it is important that you monitor your condition carefully, and do not hesitate to return to the ED if you develop new, or concerning changes in your condition. ? ?Otherwise, please follow-up with your physician for appropriate ongoing care. ? ?

## 2019-06-14 ENCOUNTER — Encounter: Payer: Self-pay | Admitting: *Deleted

## 2019-06-22 ENCOUNTER — Encounter: Payer: Self-pay | Admitting: *Deleted

## 2019-07-21 ENCOUNTER — Emergency Department: Payer: Medicaid Other

## 2019-07-21 ENCOUNTER — Other Ambulatory Visit: Payer: Self-pay

## 2019-07-21 ENCOUNTER — Encounter: Payer: Self-pay | Admitting: Emergency Medicine

## 2019-07-21 ENCOUNTER — Emergency Department
Admission: EM | Admit: 2019-07-21 | Discharge: 2019-07-21 | Disposition: A | Discharge status: Home / Self Care | Payer: Medicaid Other | Attending: Emergency Medicine | Admitting: Emergency Medicine

## 2019-07-21 DIAGNOSIS — I10 Essential (primary) hypertension: Secondary | ICD-10-CM | POA: Insufficient documentation

## 2019-07-21 DIAGNOSIS — Z87891 Personal history of nicotine dependence: Secondary | ICD-10-CM | POA: Diagnosis not present

## 2019-07-21 DIAGNOSIS — R079 Chest pain, unspecified: Secondary | ICD-10-CM | POA: Diagnosis not present

## 2019-07-21 LAB — CBC
HCT: 34.6 % — ABNORMAL LOW (ref 36.0–46.0)
Hemoglobin: 11.5 g/dL — ABNORMAL LOW (ref 12.0–15.0)
MCH: 28.5 pg (ref 26.0–34.0)
MCHC: 33.2 g/dL (ref 30.0–36.0)
MCV: 85.9 fL (ref 80.0–100.0)
Platelets: 300 10*3/uL (ref 150–400)
RBC: 4.03 MIL/uL (ref 3.87–5.11)
RDW: 14.8 % (ref 11.5–15.5)
WBC: 7.4 10*3/uL (ref 4.0–10.5)
nRBC: 0 % (ref 0.0–0.2)

## 2019-07-21 LAB — BASIC METABOLIC PANEL
Anion gap: 8 (ref 5–15)
BUN: 11 mg/dL (ref 6–20)
CO2: 23 mmol/L (ref 22–32)
Calcium: 9.3 mg/dL (ref 8.9–10.3)
Chloride: 110 mmol/L (ref 98–111)
Creatinine, Ser: 1.06 mg/dL — ABNORMAL HIGH (ref 0.44–1.00)
GFR calc Af Amer: >60 mL/min (ref >60)
GFR calc non Af Amer: >60 mL/min (ref >60)
Glucose, Bld: 104 mg/dL — ABNORMAL HIGH (ref 70–99)
Potassium: 3.6 mmol/L (ref 3.5–5.1)
Sodium: 141 mmol/L (ref 135–145)

## 2019-07-21 LAB — TROPONIN I (HIGH SENSITIVITY): Troponin I (High Sensitivity): <2 ng/L (ref <18)

## 2019-07-21 MED ORDER — LORAZEPAM 1 MG PO TABS: 1.0000 mg | ORAL_TABLET | Freq: Every day | ORAL | 0 refills | Status: AC | PRN

## 2019-07-21 MED ORDER — FLUCONAZOLE 50 MG PO TABS
150.0000 mg | ORAL_TABLET | Freq: Once | ORAL | Status: AC
  Administered 2019-07-21: 21:00:00 150 mg via ORAL
  Filled 2019-07-21: qty 1

## 2019-07-21 NOTE — ED Notes (Signed)
Helped pt to toilet. Pt ambulated well. 

## 2019-07-21 NOTE — ED Provider Notes (Signed)
[facility] Emergency Department Provider Note       Time seen: ----------------------------------------- 9:11 PM on 07/21/2019 -----------------------------------------   I have reviewed the triage vital signs and the nursing notes.  HISTORY   Chief Complaint Chest Pain (Pressure )    HPI Kristy Reid is a 24 y.o. female with a history of anemia, anxiety, asthma, bipolar disorder, depression, hypertension who presents to the ED for chest pain and pressure over her chest like something sitting on it.  She denies any recent illness or other complaints.  Past Medical History:  Diagnosis Date  . Anemia   . Anemia   . Anxiety   . Asthma   . Bipolar 1 disorder (HCC)   . Chronic back pain   . Depression   . Hypertension   . Insomnia   . Personality disorder (HCC)    "boarderline"  . PTSD (post-traumatic stress disorder)     Patient Active Problem List   Diagnosis Date Noted  . Preterm contractions 06/03/2018  . Anemia affecting pregnancy in third trimester 05/30/2018  . Limited prenatal care in second trimester 05/19/2018  . Depression with suicidal ideation 05/04/2018  . Alcohol abuse 05/04/2018  . Schizoaffective disorder, bipolar type (HCC) 05/04/2018  . Anxiety disorder   . Bipolar 2 disorder (HCC) 11/11/2016  . Noncompliance 11/11/2016  . Severe recurrent major depression with psychotic features (HCC) 07/18/2016  . MDD (major depressive disorder), recurrent, severe, with psychosis (HCC) 07/17/2016  . Decreased fetal movement 03/22/2016  . Abdominal pain in pregnancy 01/21/2016  . Constipation during pregnancy in second trimester 12/26/2015  . Overdose 11/29/2015  . Supervision of high risk pregnancy, antepartum 11/15/2015  . Cannabis use disorder, severe, dependence (HCC) 11/15/2015  . Tobacco use disorder 11/15/2015  . Asthma 11/15/2015  . Borderline personality disorder (HCC) 11/15/2015  . PTSD (post-traumatic stress disorder)  11/15/2015  . Severe recurrent major depression without psychotic features (HCC) 11/13/2015    Past Surgical History:  Procedure Laterality Date  . BUNIONECTOMY Bilateral    feet  . wrist sugery      Allergies Penicillins, Rocephin [ceftriaxone], Zithromax [azithromycin], and Lidocaine  Social History Social History   Tobacco Use  . Smoking status: Former Smoker    Types: Cigarettes    Quit date: 08/13/2015    Years since quitting: 3.9  . Smokeless tobacco: Never Used  Substance Use Topics  . Alcohol use: No  . Drug use: Yes    Frequency: 7.0 times per week    Types: Marijuana    Comment: daily use, last use yesterday    Review of Systems Constitutional: Negative for fever. Cardiovascular: Positive for chest pain Respiratory: Negative for shortness of breath. Gastrointestinal: Negative for abdominal pain, vomiting and diarrhea. Musculoskeletal: Negative for back pain. Skin: Negative for rash. Neurological: Negative for headaches, focal weakness or numbness.  All systems negative/normal/unremarkable except as stated in the HPI  ____________________________________________   PHYSICAL EXAM:  VITAL SIGNS: ED Triage Vitals  Enc Vitals Group     BP 07/21/19 2045 131/69     Pulse Rate 07/21/19 2045 (!) 107     Resp 07/21/19 2045 20     Temp 07/21/19 2045 98.4 F (36.9 C)     Temp Source 07/21/19 2045 Oral     SpO2 07/21/19 2045 99 %     Weight 07/21/19 2046 166 lb (75.3 kg)     Height 07/21/19 2046 5\' 2"  (1.575 m)     Head Circumference --  Peak Flow --      Pain Score 07/21/19 2046 8     Pain Loc --      Pain Edu? --      Excl. in [place]? --     Constitutional: Alert and oriented. Well appearing and in no distress. Eyes: Conjunctivae are normal. Normal extraocular movements. Cardiovascular: Normal rate, regular rhythm. No murmurs, rubs, or gallops. Respiratory: Normal respiratory effort without tachypnea nor retractions. Breath sounds are clear and equal  bilaterally. No wheezes/rales/rhonchi. Gastrointestinal: Soft and nontender. Normal bowel sounds Musculoskeletal: Nontender with normal range of motion in extremities. No lower extremity tenderness nor edema. Neurologic:  Normal speech and language. No gross focal neurologic deficits are appreciated.  Skin:  Skin is warm, dry and intact. No rash noted. Psychiatric: Mood and affect are normal. Speech and behavior are normal.  ____________________________________________  EKG: Interpreted by me.  Sinus rhythm with sinus arrhythmia, rate is 97 bpm, normal axis, normal QT  ____________________________________________  ED COURSE:  As part of my medical decision making, I reviewed the following data within the [place] History obtained from family if available, nursing notes, old chart and ekg, as well as notes from prior ED visits. Patient presented for chest pain, we will assess with labs and imaging as indicated at this time.   Procedures  Cyla Haluska was evaluated in Emergency Department on 07/21/2019 for the symptoms described in the history of present illness. She was evaluated in the context of the global COVID-19 pandemic, which necessitated consideration that the patient might be at risk for infection with the SARS-CoV-2 virus that causes COVID-19. Institutional protocols and algorithms that pertain to the evaluation of patients at risk for COVID-19 are in a state of rapid change based on information released by regulatory bodies including the CDC and federal and state organizations. These policies and algorithms were followed during the patient's care in the ED.  ____________________________________________   LABS (pertinent positives/negatives)  Labs Reviewed  CBC - Abnormal; Notable for the following components:      Result Value   Hemoglobin 11.5 (*)    HCT 34.6 (*)    All other components within normal limits  BASIC METABOLIC PANEL  POC URINE PREG, ED   TROPONIN I (HIGH SENSITIVITY)    RADIOLOGY  Chest x-ray is unremarkable  ____________________________________________   DIFFERENTIAL DIAGNOSIS   Musculoskeletal pain, GERD, anxiety, unstable angina or PE unlikely  FINAL ASSESSMENT AND PLAN  Chest pain   Plan: The patient had presented for chest pain. Patient's labs were reassuring. Patient's imaging did not reveal any acute process.  Chest pain is likely noncardiac.  She is cleared for outpatient follow-up.   Laurence Aly, MD    Note: This note was generated in part or whole with voice recognition software. Voice recognition is usually quite accurate but there are transcription errors that can and very often do occur. I apologize for any typographical errors that were not detected and corrected.     Earleen Newport, MD 07/21/19 2112

## 2019-07-21 NOTE — ED Triage Notes (Signed)
Pt presents to ER from home with complaints of chest pain "pressure all over my chest, like something is sitting on it" pt reports history no asthma, no cough denies any other symptom, pt talks in complete sentences no respiratory distress noted

## 2019-07-28 ENCOUNTER — Ambulatory Visit: Payer: Medicaid Other | Admitting: Cardiology

## 2019-07-31 ENCOUNTER — Encounter: Payer: Self-pay | Admitting: Cardiology

## 2019-08-04 ENCOUNTER — Emergency Department: Payer: Medicaid Other

## 2019-08-04 ENCOUNTER — Emergency Department
Admission: EM | Admit: 2019-08-04 | Discharge: 2019-08-04 | Disposition: A | Discharge status: Home / Self Care | Payer: Medicaid Other | Attending: Emergency Medicine | Admitting: Emergency Medicine

## 2019-08-04 ENCOUNTER — Other Ambulatory Visit: Payer: Self-pay

## 2019-08-04 DIAGNOSIS — Z87891 Personal history of nicotine dependence: Secondary | ICD-10-CM | POA: Insufficient documentation

## 2019-08-04 DIAGNOSIS — Z79899 Other long term (current) drug therapy: Secondary | ICD-10-CM | POA: Diagnosis not present

## 2019-08-04 DIAGNOSIS — S9032XA Contusion of left foot, initial encounter: Secondary | ICD-10-CM | POA: Diagnosis not present

## 2019-08-04 DIAGNOSIS — Y939 Activity, unspecified: Secondary | ICD-10-CM | POA: Insufficient documentation

## 2019-08-04 DIAGNOSIS — W208XXA Other cause of strike by thrown, projected or falling object, initial encounter: Secondary | ICD-10-CM | POA: Diagnosis not present

## 2019-08-04 DIAGNOSIS — S99922A Unspecified injury of left foot, initial encounter: Secondary | ICD-10-CM | POA: Diagnosis present

## 2019-08-04 DIAGNOSIS — J45909 Unspecified asthma, uncomplicated: Secondary | ICD-10-CM | POA: Diagnosis not present

## 2019-08-04 DIAGNOSIS — Y929 Unspecified place or not applicable: Secondary | ICD-10-CM | POA: Insufficient documentation

## 2019-08-04 DIAGNOSIS — Y999 Unspecified external cause status: Secondary | ICD-10-CM | POA: Insufficient documentation

## 2019-08-04 DIAGNOSIS — I10 Essential (primary) hypertension: Secondary | ICD-10-CM | POA: Diagnosis not present

## 2019-08-04 DIAGNOSIS — T07XXXA Unspecified multiple injuries, initial encounter: Secondary | ICD-10-CM

## 2019-08-04 LAB — CBC WITH DIFFERENTIAL/PLATELET
Abs Immature Granulocytes: 0.01 10*3/uL (ref 0.00–0.07)
Basophils Absolute: 0 10*3/uL (ref 0.0–0.1)
Basophils Relative: 1 %
Eosinophils Absolute: 0.2 10*3/uL (ref 0.0–0.5)
Eosinophils Relative: 4 %
HCT: 34.1 % — ABNORMAL LOW (ref 36.0–46.0)
Hemoglobin: 11.3 g/dL — ABNORMAL LOW (ref 12.0–15.0)
Immature Granulocytes: 0 %
Lymphocytes Relative: 45 %
Lymphs Abs: 2.7 10*3/uL (ref 0.7–4.0)
MCH: 27.9 pg (ref 26.0–34.0)
MCHC: 33.1 g/dL (ref 30.0–36.0)
MCV: 84.2 fL (ref 80.0–100.0)
Monocytes Absolute: 0.4 10*3/uL (ref 0.1–1.0)
Monocytes Relative: 7 %
Neutro Abs: 2.5 10*3/uL (ref 1.7–7.7)
Neutrophils Relative %: 43 %
Platelets: 288 10*3/uL (ref 150–400)
RBC: 4.05 MIL/uL (ref 3.87–5.11)
RDW: 15.7 % — ABNORMAL HIGH (ref 11.5–15.5)
WBC: 5.8 10*3/uL (ref 4.0–10.5)
nRBC: 0 % (ref 0.0–0.2)

## 2019-08-04 LAB — BASIC METABOLIC PANEL
Anion gap: 9 (ref 5–15)
BUN: 8 mg/dL (ref 6–20)
CO2: 19 mmol/L — ABNORMAL LOW (ref 22–32)
Calcium: 9.3 mg/dL (ref 8.9–10.3)
Chloride: 110 mmol/L (ref 98–111)
Creatinine, Ser: 0.86 mg/dL (ref 0.44–1.00)
GFR calc Af Amer: >60 mL/min (ref >60)
GFR calc non Af Amer: >60 mL/min (ref >60)
Glucose, Bld: 82 mg/dL (ref 70–99)
Potassium: 3.3 mmol/L — ABNORMAL LOW (ref 3.5–5.1)
Sodium: 138 mmol/L (ref 135–145)

## 2019-08-04 LAB — POCT PREGNANCY, URINE: Preg Test, Ur: NEGATIVE

## 2019-08-04 NOTE — Discharge Instructions (Signed)
Your exam, labs, and XR are all normal today. You do not have any signs of a fracture to the foot. The tenderness is caused by the contusion. Your labs do not show a low platelet count. The bruising is due to some injury to the thigh. Apply warm compresses to promote healing. Follow-up with your provider for continued symptoms.

## 2019-08-04 NOTE — ED Triage Notes (Signed)
Reports "random" bruising over her body and left foot pain.

## 2019-08-04 NOTE — ED Provider Notes (Signed)
[facility] Emergency Department Provider Note ____________________________________________  Time seen: 1648  I have reviewed the triage vital signs and the nursing notes.  HISTORY  Chief Complaint  Foot Pain and Bleeding/Bruising  HPI [place] Severs is a 24 y.o. female presents her self to the ED for  evaluation of left foot pain after local injury.  She also has a second unrelated complaint of some bruising hematoma noted to her thighs bilaterally.  Patient reports that her son dropped her cell phone on her barefoot, and she has pain over the first metatarsal.  She was concerned because the previous hallux valgus surgery there and pain with walking.  Otherwise, she describes several weeks of bruising and hematoma to the thighs without a known source.  Patient denies any local trauma.  She denies any abnormal bleeding from the nose, gums, or eyes.  She also denies any hematochezia, hematemesis, or headache.  She has a history of iron deficiency anemia, but denies any recent critical lows requiring infusion.  Otherwise patient is denied any recent fever, chills, or infection.  Past Medical History:  Diagnosis Date  . Anemia   . Anemia   . Anxiety   . Asthma   . Bipolar 1 disorder ([place])   . Chronic back pain   . Depression   . Hypertension   . Insomnia   . Personality disorder ([place])    "boarderline"  . PTSD (post-traumatic stress disorder)     Patient Active Problem List   Diagnosis Date Noted  . Preterm contractions 06/03/2018  . Anemia affecting pregnancy in third trimester 05/30/2018  . Limited prenatal care in second trimester 05/19/2018  . Depression with suicidal ideation 05/04/2018  . Alcohol abuse 05/04/2018  . Schizoaffective disorder, bipolar type ([place]) 05/04/2018  . Anxiety disorder   . Bipolar 2 disorder ([place]) 11/11/2016  . Noncompliance 11/11/2016  . Severe recurrent major depression with psychotic features ([place]) 07/18/2016  . MDD (major  depressive disorder), recurrent, severe, with psychosis ([place]) 07/17/2016  . Decreased fetal movement 03/22/2016  . Abdominal pain in pregnancy 01/21/2016  . Constipation during pregnancy in second trimester 12/26/2015  . Overdose 11/29/2015  . Supervision of high risk pregnancy, antepartum 11/15/2015  . Cannabis use disorder, severe, dependence ([place]) 11/15/2015  . Tobacco use disorder 11/15/2015  . Asthma 11/15/2015  . Borderline personality disorder ([place]) 11/15/2015  . PTSD (post-traumatic stress disorder) 11/15/2015  . Severe recurrent major depression without psychotic features ([place]) 11/13/2015    Past Surgical History:  Procedure Laterality Date  . BUNIONECTOMY Bilateral    feet  . wrist sugery      Prior to Admission medications   Medication Sig Start Date End Date Taking? Authorizing Provider  acetaminophen (TYLENOL) 500 MG tablet Take 500-1,000 mg by mouth every 6 (six) hours as needed for mild pain or moderate pain.    [provider]  albuterol (VENTOLIN HFA) 108 (90 Base) MCG/ACT inhaler Inhale 2 puffs into the lungs every 6 (six) hours as needed for wheezing or shortness of breath.    [provider]  benzonatate (TESSALON PERLES) 100 MG capsule Take 2 capsules (200 mg total) by mouth 3 (three) times daily as needed. Patient not taking: Reported on 06/03/2019 12/13/18 12/13/19  Sable Feil, PA-C  benzonatate (TESSALON PERLES) 100 MG capsule Take 2 capsules (200 mg total) by mouth 3 (three) times daily as needed. Patient not taking: Reported on 06/03/2019 02/01/19 02/01/20  Sable Feil, PA-C  citalopram (CELEXA) 10  MG tablet Take 10 mg by mouth at bedtime.    [provider]  dicyclomine (BENTYL) 20 MG tablet Take 1 tablet (20 mg total) by mouth 3 (three) times daily as needed for spasms. Patient not taking: Reported on 12/03/2018 10/22/18 10/22/19  Miguel Aschoff., MD  fexofenadine-pseudoephedrine (ALLEGRA-D) 60-120 MG 12 hr tablet Take 1 tablet by  mouth 2 (two) times daily. Patient not taking: Reported on 06/03/2019 12/13/18   Joni Reining, PA-C  fexofenadine-pseudoephedrine (ALLEGRA-D) 60-120 MG 12 hr tablet Take 1 tablet by mouth 2 (two) times daily. Patient not taking: Reported on 06/03/2019 02/01/19   Joni Reining, PA-C  fluticasone [facility]) 50 MCG/ACT nasal spray Place 1 spray into both nostrils daily.    [provider]  hydrOXYzine (ATARAX/VISTARIL) 10 MG tablet Take 10 mg by mouth 3 (three) times daily as needed for itching or anxiety.    [provider]  ibuprofen (ADVIL) 200 MG tablet Take 400-800 mg by mouth every 6 (six) hours as needed for fever or headache.    [provider]  ibuprofen (ADVIL) 600 MG tablet Take 1 tablet (600 mg total) by mouth every 8 (eight) hours as needed. Patient not taking: Reported on 06/03/2019 12/13/18   Joni Reining, PA-C  ibuprofen (ADVIL) 600 MG tablet Take 1 tablet (600 mg total) by mouth every 8 (eight) hours as needed. Patient not taking: Reported on 06/03/2019 02/01/19   Joni Reining, PA-C  loratadine (CLARITIN) 10 MG tablet Take 1 tablet (10 mg total) by mouth daily. Patient not taking: Reported on 06/30/2018 05/05/18   [name], [name], MD  LORazepam (ATIVAN) 1 MG tablet Take 1 tablet (1 mg total) by mouth daily as needed for anxiety. 07/21/19 07/20/20  Emily Filbert, MD  meclizine (ANTIVERT) 25 MG tablet Take 25 mg by mouth 3 (three) times daily as needed for dizziness.    [provider]  metoprolol tartrate (LOPRESSOR) 25 MG tablet Take 25 mg by mouth 2 (two) times daily.    [provider]  ondansetron (ZOFRAN) 4 MG tablet Take 1 tablet (4 mg total) by mouth daily as needed for nausea or vomiting. Patient not taking: Reported on 06/03/2019 12/30/18 12/30/19  Enid Derry, PA-C  QUEtiapine (SEROQUEL) 300 MG tablet Take 300 mg by mouth at bedtime.    [provider]  sulfamethoxazole-trimethoprim (BACTRIM DS) 800-160 MG tablet  Take 1 tablet by mouth 2 (two) times daily. Patient not taking: Reported on 06/03/2019 12/30/18   Enid Derry, PA-C  traMADol (ULTRAM) 50 MG tablet Take 1 tablet (50 mg total) by mouth every 6 (six) hours as needed. Patient not taking: Reported on 06/03/2019 12/30/18 12/30/19  Enid Derry, PA-C  traZODone (DESYREL) 150 MG tablet Take 150 mg by mouth at bedtime.    [provider]    Allergies Penicillins, Rocephin [ceftriaxone], Zithromax [azithromycin], and Lidocaine  Family History  Problem Relation Age of Onset  . Diabetes Mother   . Hypertension Mother   . Mental illness Mother   . Diabetes Maternal Grandmother   . Heart disease Maternal Grandmother   . Cancer Neg Hx   . Ovarian cancer Neg Hx     Social History Social History   Tobacco Use  . Smoking status: Former Smoker    Types: Cigarettes    Quit date: 08/13/2015    Years since quitting: 3.9  . Smokeless tobacco: Never Used  Substance Use Topics  . Alcohol use: No  . Drug use:  Yes    Frequency: 7.0 times per week    Types: Marijuana    Comment: daily use, last use yesterday    Review of Systems  Constitutional: Negative for fever. Eyes: Negative for visual changes. ENT: Negative for sore throat.  Negative for nosebleeds. Cardiovascular: Negative for chest pain. Respiratory: Negative for shortness of breath.  Negative for hemoptysis. Gastrointestinal: Negative for abdominal pain, vomiting and diarrhea.  Negative for hematemesis. Genitourinary: Negative for dysuria. Musculoskeletal: Negative for back pain.  Left foot pain as above. Skin: Negative for rash.  Reports hematoma/bruising as above. Neurological: Negative for headaches, focal weakness or numbness. ____________________________________________  PHYSICAL EXAM:  VITAL SIGNS: ED Triage Vitals [08/04/19 1543]  Enc Vitals Group     BP 131/86     Pulse Rate 83     Resp 16     Temp 98.7 F (37.1 C)     Temp Source Oral     SpO2 100 %      Weight 152 lb (68.9 kg)     Height 5\' 2"  (1.575 m)     Head Circumference      Peak Flow      Pain Score 8     Pain Loc      Pain Edu?      Excl. in [place]?     Constitutional: Alert and oriented. Well appearing and in no distress. Head: Normocephalic and atraumatic. Eyes: Conjunctivae are normal. Normal extraocular movements Cardiovascular: Normal rate, regular rhythm. Normal distal pulses. Respiratory: Normal respiratory effort. No wheezes/rales/rhonchi. Gastrointestinal: Soft and nontender. No distention.  No CVA tenderness elicited. Musculoskeletal: Nontender with normal range of motion in all extremities.  Left foot with a stable chronic dorsal first metatarsal surgical scar.  No overlying ecchymosis, bruising, edema, or abrasion noted.  Normal ankle/foot exam. Neurologic: Antalgic gait without ataxia. Normal speech and language. No gross focal neurologic deficits are appreciated. Skin:  Skin is warm, dry and intact. No rash noted.  Patient with a 4 cm hematoma to the inner left thigh.  There is some edema to the area and resolving ecchymosis.  No petechial rash is appreciated. Psychiatric: Mood and affect are normal. Patient exhibits appropriate insight and judgment. ____________________________________________   LABS (pertinent positives/negatives) Labs Reviewed  BASIC METABOLIC PANEL - Abnormal; Notable for the following components:      Result Value   Potassium 3.3 (*)    CO2 19 (*)    All other components within normal limits  CBC WITH DIFFERENTIAL/PLATELET - Abnormal; Notable for the following components:   Hemoglobin 11.3 (*)    HCT 34.1 (*)    RDW 15.7 (*)    All other components within normal limits  POC URINE PREG, ED  POCT PREGNANCY, URINE  ____________________________________________   RADIOLOGY  DG Left Foot  IMPRESSION: Mild hallux valgus deformity  I, Kainoa Swoboda V Bacon-Linell Shawn, personally viewed and evaluated these images (plain radiographs) as part of  my medical decision making, as well as reviewing the written report by the radiologist. ____________________________________________  PROCEDURES  Ace bandage  Procedures ____________________________________________  INITIAL IMPRESSION / ASSESSMENT AND PLAN / ED COURSE  Patient with ED evaluation of 2 separate complaints.  She presents with local trauma to the left foot, x-rays negative for any acute fracture or dislocation.  Presentation is consistent with a local contusion.  Secondarily the patient has a hematoma to the inner thigh.  The labs are reassuring as a show no acute thrombocytopenia.  Patient with a stable mild anemia  redemonstrated.  She is overall with a normal exam without any signs of a bleeding abnormality.  Patient is reassured at this time, and will follow up with her primary provider for ongoing symptoms.  Ace bandage is placed on the foot for comfort.  She will follow up or return as discussed.  Jamese Duchess Armendarez was evaluated in Emergency Department on 08/04/2019 for the symptoms described in the history of present illness. She was evaluated in the context of the global COVID-19 pandemic, which necessitated consideration that the patient might be at risk for infection with the SARS-CoV-2 virus that causes COVID-19. Institutional protocols and algorithms that pertain to the evaluation of patients at risk for COVID-19 are in a state of rapid change based on information released by regulatory bodies including the CDC and federal and state organizations. These policies and algorithms were followed during the patient's care in the ED. ____________________________________________  FINAL CLINICAL IMPRESSION(S) / ED DIAGNOSES  Final diagnoses:  Contusion of left foot, initial encounter  Multiple bruises      Lissa Hoard, PA-C 08/04/19 1906    Phineas Semen, MD 08/04/19 2012

## 2019-08-08 ENCOUNTER — Other Ambulatory Visit: Payer: Self-pay | Admitting: Psychiatry

## 2019-08-27 ENCOUNTER — Emergency Department: Payer: Medicaid Other

## 2019-08-27 ENCOUNTER — Encounter: Payer: Self-pay | Admitting: Emergency Medicine

## 2019-08-27 ENCOUNTER — Other Ambulatory Visit: Payer: Self-pay

## 2019-08-27 ENCOUNTER — Emergency Department
Admission: EM | Admit: 2019-08-27 | Discharge: 2019-08-27 | Disposition: A | Discharge status: Home / Self Care | Payer: Medicaid Other | Attending: Emergency Medicine | Admitting: Emergency Medicine

## 2019-08-27 DIAGNOSIS — A6004 Herpesviral vulvovaginitis: Secondary | ICD-10-CM | POA: Diagnosis not present

## 2019-08-27 DIAGNOSIS — J45909 Unspecified asthma, uncomplicated: Secondary | ICD-10-CM | POA: Insufficient documentation

## 2019-08-27 DIAGNOSIS — I10 Essential (primary) hypertension: Secondary | ICD-10-CM | POA: Insufficient documentation

## 2019-08-27 DIAGNOSIS — R0789 Other chest pain: Secondary | ICD-10-CM | POA: Insufficient documentation

## 2019-08-27 DIAGNOSIS — Z79899 Other long term (current) drug therapy: Secondary | ICD-10-CM | POA: Insufficient documentation

## 2019-08-27 DIAGNOSIS — R079 Chest pain, unspecified: Secondary | ICD-10-CM

## 2019-08-27 DIAGNOSIS — Z87891 Personal history of nicotine dependence: Secondary | ICD-10-CM | POA: Insufficient documentation

## 2019-08-27 LAB — POCT PREGNANCY, URINE: Preg Test, Ur: NEGATIVE

## 2019-08-27 LAB — BASIC METABOLIC PANEL
Anion gap: 9 (ref 5–15)
BUN: 12 mg/dL (ref 6–20)
CO2: 21 mmol/L — ABNORMAL LOW (ref 22–32)
Calcium: 9.3 mg/dL (ref 8.9–10.3)
Chloride: 108 mmol/L (ref 98–111)
Creatinine, Ser: 0.82 mg/dL (ref 0.44–1.00)
GFR calc Af Amer: >60 mL/min (ref >60)
GFR calc non Af Amer: >60 mL/min (ref >60)
Glucose, Bld: 91 mg/dL (ref 70–99)
Potassium: 3.6 mmol/L (ref 3.5–5.1)
Sodium: 138 mmol/L (ref 135–145)

## 2019-08-27 LAB — CBC
HCT: 36.1 % (ref 36.0–46.0)
Hemoglobin: 11.5 g/dL — ABNORMAL LOW (ref 12.0–15.0)
MCH: 26.6 pg (ref 26.0–34.0)
MCHC: 31.9 g/dL (ref 30.0–36.0)
MCV: 83.4 fL (ref 80.0–100.0)
Platelets: 335 10*3/uL (ref 150–400)
RBC: 4.33 MIL/uL (ref 3.87–5.11)
RDW: 15.9 % — ABNORMAL HIGH (ref 11.5–15.5)
WBC: 7.7 10*3/uL (ref 4.0–10.5)
nRBC: 0 % (ref 0.0–0.2)

## 2019-08-27 LAB — TROPONIN I (HIGH SENSITIVITY): Troponin I (High Sensitivity): <2 ng/L (ref <18)

## 2019-08-27 LAB — FIBRIN DERIVATIVES D-DIMER (ARMC ONLY): Fibrin derivatives D-dimer (ARMC): 560.6 ng/mL (FEU) — ABNORMAL HIGH (ref 0.00–499.00)

## 2019-08-27 MED ORDER — NAPROXEN 375 MG PO TABS: 375.0000 mg | ORAL_TABLET | Freq: Two times a day (BID) | ORAL | 0 refills | Status: DC

## 2019-08-27 MED ORDER — IOHEXOL 350 MG/ML SOLN
75.0000 mL | Freq: Once | INTRAVENOUS | Status: AC | PRN
  Administered 2019-08-27: 75 mL via INTRAVENOUS
  Filled 2019-08-27: qty 75

## 2019-08-27 MED ORDER — ACYCLOVIR 400 MG PO TABS
800.0000 mg | ORAL_TABLET | Freq: Two times a day (BID) | ORAL | 0 refills | Status: AC
Start: 2019-08-27 — End: 2019-09-01

## 2019-08-27 NOTE — ED Provider Notes (Signed)
[facility] Emergency Department Provider Note   ____________________________________________   First MD Initiated Contact with Patient 08/27/19 1253     (approximate)  I have reviewed the triage vital signs and the nursing notes.   HISTORY  Chief Complaint Chest Pain   HPI Kristy Reid is a 24 y.o. female presents to the emergency department with complaint of random anterior chest wall pain.  Patient states that it started last 2 days.  She states that it increases with deep inspiration and especially with exertion.  She is unable to walk from the first floor to the second floor of her home without shortness of breath.  Patient denies any previous DVTs or PEs.  Patient is on Depo and admits to smoking weed.  She denies any recent travel or injury to her lower legs.  Patient denies any difficulty breathing.  She is also here because she is having her second outbreak of herpes with the first 1 day last month.  Patient is also requesting medication for her second outbreak of herpes genitalia.  Patient states that her doctor treated her last month but she does not know the name of the medication.  Rates her pain as 6 out of 10.       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/17/2016   Decreased fetal movement 03/22/2016   Abdominal pain in pregnancy 01/21/2016   Constipation during pregnancy in second trimester 12/26/2015   Overdose 11/29/2015   Supervision of high risk pregnancy, antepartum 11/15/2015   Cannabis use disorder, severe, dependence ([place]) 11/15/2015   Tobacco use disorder 11/15/2015   Asthma 11/15/2015   Borderline personality disorder ([place]) 11/15/2015   PTSD (post-traumatic stress disorder) 11/15/2015   Severe recurrent major depression without psychotic features ([place]) 11/13/2015    Past Surgical History:  Procedure Laterality Date   BUNIONECTOMY Bilateral    feet   wrist sugery      Prior to Admission medications   Medication Sig Start Date End Date Taking? Authorizing Provider  acetaminophen (TYLENOL) 500 MG tablet Take 500-1,000 mg by mouth every 6 (six) hours as needed for mild pain or moderate pain.    [provider]  acyclovir (ZOVIRAX) 400 MG tablet Take 2 tablets (800 mg total) by mouth 2 (two) times daily for 5 days. 08/27/19 09/01/19  Johnn Hai, PA-C  albuterol (VENTOLIN HFA) 108 (90 Base) MCG/ACT inhaler Inhale 2 puffs into the lungs every 6 (six) hours as needed for wheezing or shortness of breath.    [provider]  benzonatate (TESSALON PERLES) 100 MG capsule Take 2 capsules (200 mg  total) by mouth 3 (three) times daily as needed. Patient not taking: Reported on 06/03/2019 02/01/19 02/01/20  Joni Reining, PA-C  citalopram (CELEXA) 10 MG tablet Take 10 mg by mouth at bedtime.    [provider]  fexofenadine-pseudoephedrine (ALLEGRA-D) 60-120 MG 12 hr tablet Take 1 tablet by mouth 2 (two) times daily. Patient not taking: Reported on 06/03/2019 02/01/19   Joni Reining, PA-C  fluticasone [facility]) 50 MCG/ACT nasal spray Place 1 spray into both  nostrils daily.    [provider]  hydrOXYzine (ATARAX/VISTARIL) 10 MG tablet Take 10 mg by mouth 3 (three) times daily as needed for itching or anxiety.    [provider]  LORazepam (ATIVAN) 1 MG tablet Take 1 tablet (1 mg total) by mouth daily as needed for anxiety. 07/21/19 07/20/20  Emily Filbert, MD  meclizine (ANTIVERT) 25 MG tablet Take 25 mg by mouth 3 (three) times daily as needed for dizziness.    [provider]  metoprolol tartrate (LOPRESSOR) 25 MG tablet Take 25 mg by mouth 2 (two) times daily.    [provider]  naproxen (NAPROSYN) 375 MG tablet Take 1 tablet (375 mg total) by mouth 2 (two) times daily with a meal. 08/27/19   Bridget Hartshorn L, PA-C  QUEtiapine (SEROQUEL) 300 MG tablet Take 300 mg by mouth at bedtime.    [provider]  traZODone (DESYREL) 150 MG tablet Take 150 mg by mouth at bedtime.    [provider]  dicyclomine (BENTYL) 20 MG tablet Take 1 tablet (20 mg total) by mouth 3 (three) times daily as needed for spasms. Patient not taking: Reported on 12/03/2018 10/22/18 08/27/19  Miguel Aschoff., MD  loratadine (CLARITIN) 10 MG tablet Take 1 tablet (10 mg total) by mouth daily. Patient not taking: Reported on 06/30/2018 05/05/18 08/27/19  [name], [name], MD    Allergies Penicillins, Rocephin [ceftriaxone], Zithromax [azithromycin], and Lidocaine  Family History  Problem Relation Age of Onset   Diabetes Mother    Hypertension Mother    Mental illness Mother    Diabetes Maternal Grandmother    Heart disease Maternal Grandmother    Cancer Neg Hx    Ovarian cancer Neg Hx     Social History Social History   Tobacco Use   Smoking status: Former Smoker    Types: Cigarettes    Quit date: 08/13/2015    Years since quitting: 4.0   Smokeless tobacco: Never Used  Vaping Use   Vaping Use: Never used  Substance Use Topics   Alcohol use: No   Drug use: Yes    Frequency: 7.0 times per week     Types: Marijuana    Comment: daily use, last use yesterday    Review of Systems Constitutional: No fever/chills Eyes: No visual changes. ENT: No sore throat. Cardiovascular: Positive chest pain. Respiratory: Denies shortness of breath.  Negative for cough. Gastrointestinal: No abdominal pain.  No nausea, no vomiting.  Genitourinary: Negative for dysuria.  Positive for active herpes. Musculoskeletal: Negative for back pain. Skin: Negative for rash. Neurological: Negative for headaches, focal weakness or numbness. Psychiatric:  Positive PTSD, positive bipolar disorder, positive schizoaffective disorder. ____________________________________________   PHYSICAL EXAM:  VITAL SIGNS: ED Triage Vitals  Enc Vitals Group     BP 08/27/19 1237 130/72     Pulse Rate 08/27/19 1237 (!) 116     Resp 08/27/19 1237 18     Temp 08/27/19 1237 98.8 F (37.1 C)  Temp Source 08/27/19 1237 Oral     SpO2 08/27/19 1237 100 %     Weight 08/27/19 1235 149 lb (67.6 kg)     Height 08/27/19 1235 5\' 2"  (1.575 m)     Head Circumference --      Peak Flow --      Pain Score 08/27/19 1235 6     Pain Loc --      Pain Edu? --      Excl. in GC? --     Constitutional: Alert and oriented. Well appearing and in no acute distress. Eyes: Conjunctivae are normal.  Head: Atraumatic. Neck: No stridor.   Cardiovascular: Normal rate, regular rhythm. Grossly normal heart sounds.  Good peripheral circulation. Respiratory: Normal respiratory effort.  No retractions. Lungs CTAB.  No point tenderness is noted on palpation of the chest wall. Gastrointestinal: Soft and nontender. No distention.  Bowel sounds normoactive x4 quadrants. Musculoskeletal: Patient is able move upper and lower extremities with any difficulty.  There is no difficulty with range of motion patient is ambulatory without any assistance. Neurologic:  Normal speech and language. No gross focal neurologic deficits are appreciated. No gait  instability. Skin:  Skin is warm, dry and intact.  Psychiatric: Mood and affect are normal. Speech and behavior are normal.  ____________________________________________   LABS (all labs ordered are listed, but only abnormal results are displayed)  Labs Reviewed  BASIC METABOLIC PANEL - Abnormal; Notable for the following components:      Result Value   CO2 21 (*)    All other components within normal limits  CBC - Abnormal; Notable for the following components:   Hemoglobin 11.5 (*)    RDW 15.9 (*)    All other components within normal limits  FIBRIN DERIVATIVES D-DIMER ([facility] ONLY) - Abnormal; Notable for the following components:   Fibrin derivatives D-dimer ([facility]) 560.60 (*)    All other components within normal limits  POC URINE PREG, ED  POC URINE PREG, ED  POCT PREGNANCY, URINE  TROPONIN I (HIGH SENSITIVITY)   ____________________________________________  EKG  Sinus tachycardia with Ventricular rate of 106. ____________________________________________  RADIOLOGY   Official radiology report(s): DG Chest 2 View  Result Date: 08/27/2019 CLINICAL DATA:  Chest pain EXAM: CHEST - 2 VIEW COMPARISON:  07/21/2019 FINDINGS: The heart size and mediastinal contours are within normal limits. Both lungs are clear. The visualized skeletal structures are unremarkable. IMPRESSION: No active cardiopulmonary disease. Electronically Signed   By: 09/20/2019.  Shick M.D.   On: 08/27/2019 13:09   CT Angio Chest PE W and/or Wo Contrast  Result Date: 08/27/2019 CLINICAL DATA:  Shortness of breath EXAM: CT ANGIOGRAPHY CHEST WITH CONTRAST TECHNIQUE: Multidetector CT imaging of the chest was performed using the standard protocol during bolus administration of intravenous contrast. Multiplanar CT image reconstructions and MIPs were obtained to evaluate the vascular anatomy. CONTRAST:  79mL OMNIPAQUE IOHEXOL 350 MG/ML SOLN COMPARISON:  05/26/2018 FINDINGS: Cardiovascular: Satisfactory opacification of the  pulmonary arteries to the segmental level. No evidence of pulmonary embolism. Normal heart size. No pericardial effusion. Mediastinum/Nodes: No enlarged mediastinal, hilar, or axillary lymph nodes. Age-appropriate thymic remnant in the anterior mediastinum. Thyroid gland, trachea, and esophagus demonstrate no significant findings. Lungs/Pleura: Lungs are clear. No pleural effusion or pneumothorax. Upper Abdomen: No acute abnormality. Musculoskeletal: No chest wall abnormality. No acute or significant osseous findings. Review of the MIP images confirms the above findings. IMPRESSION: Negative examination for pulmonary embolism. Electronically Signed   By: 07/26/2018.D.  On: 08/27/2019 15:31    ____________________________________________   PROCEDURES  Procedure(s) performed (including Critical Care):  Procedures   ____________________________________________   INITIAL IMPRESSION / ASSESSMENT AND PLAN / ED COURSE  As part of my medical decision making, I reviewed the following data within the electronic MEDICAL RECORD NUMBER Notes from prior ED visits and [place] Controlled Substance Database  24 year old female presents to the ED with vague symptoms of nonspecific chest pain.  Patient states this started 2 days ago and is worse with deep inspiration or she gets short of breath with exertion.  States that is more noticeable when she is walking from the first floor to the second floor of her home.  Patient has 2 risk factors of Depo-Provera and smoking.  Patient lab work was reassuring with the exception of her D-dimer which was elevated.  A CT angio ruled out a PE and patient was made aware.  She was started on a prescription for naproxen 375 mg twice daily with food for chest wall pain.  She also was given prescription for acyclovir 800 mg twice daily for 5 days.  She is to follow-up with her PCP if any continued problems.  Also she is encouraged to discontinue  smoking.  ____________________________________________   FINAL CLINICAL IMPRESSION(S) / ED DIAGNOSES  Final diagnoses:  Nonspecific chest pain  Herpes simplex vulvovaginitis     ED Discharge Orders         Ordered    naproxen (NAPROSYN) 375 MG tablet  2 times daily with meals     Discontinue  Reprint     08/27/19 1555    acyclovir (ZOVIRAX) 400 MG tablet  2 times daily     Discontinue  Reprint     08/27/19 1612           Note:  This document was prepared using Dragon voice recognition software and may include unintentional dictation errors.    Tommi Rumps, PA-C 08/27/19 1617    Jene Every, MD 08/28/19 [phone]

## 2019-08-27 NOTE — Discharge Instructions (Signed)
Follow-up with your primary care provider at [facility] if any continued problems or concerns.  Your test in the emergency department ruled out a injury to your heart as well as you do not have any blood clots in your lungs.  Begin taking the naproxen twice a day with food.  You may apply ice or heat to your chest as needed for discomfort.  Discontinue smoking if at all possible as this increases your chances for lung problems.

## 2019-08-27 NOTE — ED Triage Notes (Signed)
Presents with intermittent chest pain for 2 days   Pain increases with deep breathing and activity   Also states she is having a herpes outbreak

## 2019-08-27 NOTE — ED Notes (Signed)
Per EDP do not draw second troponin.  °

## 2019-08-31 ENCOUNTER — Other Ambulatory Visit: Payer: Self-pay

## 2019-08-31 ENCOUNTER — Ambulatory Visit (INDEPENDENT_AMBULATORY_CARE_PROVIDER_SITE_OTHER): Payer: Medicaid Other | Admitting: Cardiology

## 2019-08-31 ENCOUNTER — Encounter: Payer: Self-pay | Admitting: Cardiology

## 2019-08-31 VITALS — BP 112/66 | HR 110 | Ht 62.0 in | Wt 150.0 lb

## 2019-08-31 DIAGNOSIS — R000 Tachycardia, unspecified: Secondary | ICD-10-CM

## 2019-08-31 DIAGNOSIS — R0600 Dyspnea, unspecified: Secondary | ICD-10-CM

## 2019-08-31 DIAGNOSIS — R079 Chest pain, unspecified: Secondary | ICD-10-CM | POA: Diagnosis not present

## 2019-08-31 DIAGNOSIS — I10 Essential (primary) hypertension: Secondary | ICD-10-CM

## 2019-08-31 MED ORDER — METOPROLOL TARTRATE 50 MG PO TABS
50.0000 mg | ORAL_TABLET | Freq: Two times a day (BID) | ORAL | 6 refills | Status: DC
Start: 2019-08-31 — End: 2019-10-09

## 2019-08-31 NOTE — Progress Notes (Signed)
Cardiology Office Note:    Date:  08/31/2019   ID:  Kristy Reid, DOB 28-Sep-1995, MRN [id number]  PCP:  [facility] [place] Cardiologist:  Kate Sable, MD  [place] Electrophysiologist:  None   Referring MD: [place]   Chief Complaint  Patient presents with  . New Patient (Initial Visit)    Ref by Dr. Kary Kos for HTN, atypical chest pain and dizziness. Meds reviewed by the pt. verbally. Pt. c/o shortness of breath, dizziness, syncope, rapid irreg. heart beats and chest pain.     History of Present Illness:    Kristy Reid is a 24 y.o. female with a hx of anxiety, vertigo, bipolar disorder, hypertension being seen due to chest pain, shortness of breath and rapid heart rates.  Patient states having repeat heart rates for years now.  She has rarely had heart rates below 100 bpm.  She endorses worsening symptoms of chest pain, shortness of breath and rapid heart rates.  Symptoms of chest pain or shortness of breath are not related with exertion.  Describes chest pain as sharp, sometimes lasting a couple of minutes to do entire day.  Typically, when she gets out of breath, her heart rate is also elevated as she feels her heart pounding, causing chest discomfort.  Due to worsening shortness of breath, she presented to the ED 4 days ago where CTA chest was negative for PE.  EKG showed sinus tachycardia.  She states having a history of CAD/MI in her mother age 12s to 58s.  She was a smoker.  Her brother and father have history is of pulmonary hypertension.  Past Medical History:  Diagnosis Date  . Anemia   . Anemia   . Anxiety   . Asthma   . Bipolar 1 disorder ([place])   . Chronic back pain   . Depression   . Hypertension   . Insomnia   . 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 PTSD (post-traumatic stress disorder)     Past Surgical History:  Procedure Laterality Date  . BUNIONECTOMY Bilateral    feet  . wrist  sugery      Current Medications: Current Meds  Medication Sig  . acetaminophen (TYLENOL) 500 MG tablet Take 500-1,000 mg by mouth every 6 (six) hours as needed for mild pain or moderate pain.  Marland Kitchen acyclovir (ZOVIRAX) 400 MG tablet Take 2 tablets (800 mg total) by mouth 2 (two) times daily for 5 days.  Marland Kitchen albuterol (VENTOLIN HFA) 108 (90 Base) MCG/ACT inhaler Inhale 2 puffs into the lungs every 6 (six) hours as needed for wheezing or shortness of breath.  . benzonatate (TESSALON PERLES) 100 MG capsule Take 2 capsules (200 mg total) by mouth 3 (three) times daily as needed.  . dicyclomine (BENTYL) 20 MG tablet Take 20 mg by mouth every 6 (six) hours.  . fexofenadine-pseudoephedrine (ALLEGRA-D) 60-120 MG 12 hr tablet Take 1 tablet by mouth 2 (two) times daily.  . fluticasone (FLONASE) 50 MCG/ACT nasal spray Place 1 spray into both nostrils daily.  . hydrOXYzine (ATARAX/VISTARIL) 10 MG tablet Take 10 mg by mouth 3 (three) times daily as needed for itching or anxiety.  Marland Kitchen LORazepam (ATIVAN) 1 MG tablet Take 1 tablet (1 mg total) by mouth daily as needed for anxiety.  . meclizine (ANTIVERT) 25 MG tablet Take 25 mg by mouth 3 (three) times daily as needed for dizziness.  . [DISCONTINUED] dicyclomine (BENTYL) 20 MG tablet Take 1 tablet (  20 mg total) by mouth 3 (three) times daily as needed for spasms.  . [DISCONTINUED] metoprolol tartrate (LOPRESSOR) 25 MG tablet Take 25 mg by mouth 2 (two) times daily.     Allergies:   Penicillins, Rocephin [ceftriaxone], Zithromax [azithromycin], and Lidocaine   Social History   Socioeconomic History  . Marital status: Single    Spouse name: Not on file  . Number of children: 2  . Years of education: Not on file  . Highest education level: Not on file  Occupational History  . Not on file  Tobacco Use  . Smoking status: Former Smoker    Types: Cigarettes    Quit date: 08/13/2015    Years since quitting: 4.0  . Smokeless tobacco: Never Used  Vaping Use  .  Vaping Use: Never used  Substance and Sexual Activity  . Alcohol use: No  . Drug use: Yes    Frequency: 7.0 times per week    Types: Marijuana    Comment: daily use, last use today 08/31/2019  . Sexual activity: Yes    Comment: undecided  Other Topics Concern  . Not on file  Social History Narrative  . Not on file   Social Determinants of Health   Financial Resource Strain:   . Difficulty of Paying Living Expenses:   Food Insecurity:   . Worried About Programme researcher, broadcasting/film/videounning Out of Food in the Last Year:   . Baristaan Out of Food in the Last Year:   Transportation Needs:   . Freight forwarderLack of Transportation (Medical):   Marland Kitchen. Lack of Transportation (Non-Medical):   Physical Activity:   . Days of Exercise per Week:   . Minutes of Exercise per Session:   Stress:   . Feeling of Stress :   Social Connections:   . Frequency of Communication with Friends and Family:   . Frequency of Social Gatherings with Friends and Family:   . Attends Religious Services:   . Active Member of Clubs or Organizations:   . Attends BankerClub or Organization Meetings:   Marland Kitchen. Marital Status:      Family History: The patient's family history includes Diabetes in her maternal grandmother and mother; Heart disease in her maternal grandmother; Hypertension in her mother; Mental illness in her mother. There is no history of Cancer or Ovarian cancer.  ROS:   Please see the history of present illness.     All other systems reviewed and are negative.  EKGs/Labs/Other Studies Reviewed:    The following studies were reviewed today:   EKG:  EKG is  ordered today.  The ekg ordered today demonstrates sinus tachycardia, heart rate 110 otherwise normal ECG.  Recent Labs: 12/23/2018: ALT 22 08/27/2019: BUN 12; Creatinine, Ser 0.82; Hemoglobin 11.5; Platelets 335; Potassium 3.6; Sodium 138  Recent Lipid Panel    Component Value Date/Time   CHOL 153 09/21/2017 0146   TRIG 74 09/21/2017 0146   HDL 46 09/21/2017 0146   CHOLHDL 3.3 09/21/2017 0146    VLDL 15 09/21/2017 0146   LDLCALC 92 09/21/2017 0146    Physical Exam:    VS:  BP 112/66 (BP Location: Right Arm, Patient Position: Sitting, Cuff Size: Normal)   Pulse (!) 110   Ht 5\' 2"  (1.575 m)   Wt 150 lb (68 kg)   LMP 08/04/2019 Comment: neg preg  SpO2 98%   BMI 27.44 kg/m     Wt Readings from Last 3 Encounters:  08/31/19 150 lb (68 kg)  08/27/19 149 lb (67.6 kg)  08/04/19 152 lb (68.9 kg)     GEN:  Well nourished, well developed in no acute distress HEENT: Normal NECK: No JVD; No carotid bruits LYMPHATICS: No lymphadenopathy CARDIAC: RRR, no murmurs, rubs, gallops RESPIRATORY:  Clear to auscultation without rales, wheezing or rhonchi  ABDOMEN: Soft, non-tender, non-distended MUSCULOSKELETAL:  No edema; No deformity  SKIN: Warm and dry NEUROLOGIC:  Alert and oriented x 3 PSYCHIATRIC:  Normal affect   ASSESSMENT:    1. Dyspnea, unspecified type   2. Chest pain of uncertain etiology   3. Inappropriate sinus tachycardia   4. Essential hypertension    PLAN:    In order of problems listed above:  1. Patient with nonspecific shortness of breath.  Risk factors of hypertension, family history of pulmonary hypertension.  Get echocardiogram to evaluate function and also pressures. 2. History of atypical chest discomfort.  Echocardiogram as above.  Risk factors of hypertension. 3. Patient with inappropriate sinus tachycardia, increase Lopressor to 50 mg twice daily.  Hopefully with controlling heart rates, it will prevent tachycardic episodes which usually cause shortness of breath and chest discomfort.  Orthostatic vitals in the office with no evidence for orthostasis. 4. History of hypertension, BP control.  Increase Lopressor as above.  Follow-up after echocardiogram.  Total encounter time more than 60 minutes  Greater than 50% was spent in counseling and coordination of care with the patient   Medication Adjustments/Labs and Tests Ordered: Current medicines are  reviewed at length with the patient today.  Concerns regarding medicines are outlined above.  Orders Placed This Encounter  Procedures  . EKG 12-Lead  . ECHOCARDIOGRAM COMPLETE   Meds ordered this encounter  Medications  . metoprolol tartrate (LOPRESSOR) 50 MG tablet    Sig: Take 1 tablet (50 mg total) by mouth 2 (two) times daily.    Dispense:  60 tablet    Refill:  6    Patient Instructions  Medication Instructions:   Your physician has recommended you make the following change in your medication:   1.  INCREASE your Lopressor: Take 1 tablet (50 mg total) by mouth 2 (two) times daily  *If you need a refill on your cardiac medications before your next appointment, please call your pharmacy*   Lab Work: None Ordered. If you have labs (blood work) drawn today and your tests are completely normal, you will receive your results only by: Marland Kitchen MyChart Message (if you have MyChart) OR . A paper copy in the mail If you have any lab test that is abnormal or we need to change your treatment, we will call you to review the results.   Testing/Procedures:  1.  Your physician has requested that you have an echocardiogram. Echocardiography is a painless test that uses sound waves to create images of your heart. It provides your doctor with information about the size and shape of your heart and how well your heart's chambers and valves are working. This procedure takes approximately one hour. There are no restrictions for this procedure.    Follow-Up: At [facility], you and your health needs are our priority.  As part of our continuing mission to provide you with exceptional heart care, we have created designated Provider Care Teams.  These Care Teams include your primary Cardiologist (physician) and Advanced Practice Providers (APPs -  Physician Assistants and Nurse Practitioners) who all work together to provide you with the care you need, when you need it.  We recommend signing up for  the patient portal called "  MyChart".  Sign up information is provided on this After Visit Summary.  MyChart is used to connect with patients for Virtual Visits (Telemedicine).  Patients are able to view lab/test results, encounter notes, upcoming appointments, etc.  Non-urgent messages can be sent to your provider as well.   To learn more about what you can do with MyChart, go to [email or web address].    Your next appointment:   Follow up after echo   The format for your next appointment:   In Person  Provider:   Debbe Odea, MD   Other Instructions   Echocardiogram An echocardiogram is a procedure that uses painless sound waves (ultrasound) to produce an image of the heart. Images from an echocardiogram can provide important information about:  Signs of coronary artery disease (CAD).  Aneurysm detection. An aneurysm is a weak or damaged part of an artery wall that bulges out from the normal force of blood pumping through the body.  Heart size and shape. Changes in the size or shape of the heart can be associated with certain conditions, including heart failure, aneurysm, and CAD.  Heart muscle function.  Heart valve function.  Signs of a past heart attack.  Fluid buildup around the heart.  Thickening of the heart muscle.  A tumor or infectious growth around the heart valves. Tell a health care provider about:  Any allergies you have.  All medicines you are taking, including vitamins, herbs, eye drops, creams, and over-the-counter medicines.  Any blood disorders you have.  Any surgeries you have had.  Any medical conditions you have.  Whether you are pregnant or may be pregnant. What are the risks? Generally, this is a safe procedure. However, problems may occur, including:  Allergic reaction to dye (contrast) that may be used during the procedure. What happens before the procedure? No specific preparation is needed. You may eat and drink normally. What  happens during the procedure?   An IV tube may be inserted into one of your veins.  You may receive contrast through this tube. A contrast is an injection that improves the quality of the pictures from your heart.  A gel will be applied to your chest.  A wand-like tool (transducer) will be moved over your chest. The gel will help to transmit the sound waves from the transducer.  The sound waves will harmlessly bounce off of your heart to allow the heart images to be captured in real-time motion. The images will be recorded on a computer. The procedure may vary among health care providers and hospitals. What happens after the procedure?  You may return to your normal, everyday life, including diet, activities, and medicines, unless your health care provider tells you not to do that. Summary  An echocardiogram is a procedure that uses painless sound waves (ultrasound) to produce an image of the heart.  Images from an echocardiogram can provide important information about the size and shape of your heart, heart muscle function, heart valve function, and fluid buildup around your heart.  You do not need to do anything to prepare before this procedure. You may eat and drink normally.  After the echocardiogram is completed, you may return to your normal, everyday life, unless your health care provider tells you not to do that. This information is not intended to replace advice given to you by your health care provider. Make sure you discuss any questions you have with your health care provider. Document Revised: 06/23/2018 Document Reviewed: 04/04/2016 Elsevier Patient Education  2020  Elsevier Inc.      Signed, Debbe Odea, MD  08/31/2019 4:41 PM    [facility]

## 2019-08-31 NOTE — Patient Instructions (Signed)
Medication Instructions:   Your physician has recommended you make the following change in your medication:   1.  INCREASE your Lopressor: Take 1 tablet (50 mg total) by mouth 2 (two) times daily  *If you need a refill on your cardiac medications before your next appointment, please call your pharmacy*   Lab Work: None Ordered. If you have labs (blood work) drawn today and your tests are completely normal, you will receive your results only by: Marland Kitchen MyChart Message (if you have MyChart) OR . A paper copy in the mail If you have any lab test that is abnormal or we need to change your treatment, we will call you to review the results.   Testing/Procedures:  1.  Your physician has requested that you have an echocardiogram. Echocardiography is a painless test that uses sound waves to create images of your heart. It provides your doctor with information about the size and shape of your heart and how well your heart's chambers and valves are working. This procedure takes approximately one hour. There are no restrictions for this procedure.    Follow-Up: At [facility], you and your health needs are our priority.  As part of our continuing mission to provide you with exceptional heart care, we have created designated Provider Care Teams.  These Care Teams include your primary Cardiologist (physician) and Advanced Practice Providers (APPs -  Physician Assistants and Nurse Practitioners) who all work together to provide you with the care you need, when you need it.  We recommend signing up for the patient portal called "MyChart".  Sign up information is provided on this After Visit Summary.  MyChart is used to connect with patients for Virtual Visits (Telemedicine).  Patients are able to view lab/test results, encounter notes, upcoming appointments, etc.  Non-urgent messages can be sent to your provider as well.   To learn more about what you can do with MyChart, go to [email or web address].     Your next appointment:   Follow up after echo   The format for your next appointment:   In Person  Provider:   Kate Sable, MD   Other Instructions   Echocardiogram An echocardiogram is a procedure that uses painless sound waves (ultrasound) to produce an image of the heart. Images from an echocardiogram can provide important information about:  Signs of coronary artery disease (CAD).  Aneurysm detection. An aneurysm is a weak or damaged part of an artery wall that bulges out from the normal force of blood pumping through the body.  Heart size and shape. Changes in the size or shape of the heart can be associated with certain conditions, including heart failure, aneurysm, and CAD.  Heart muscle function.  Heart valve function.  Signs of a past heart attack.  Fluid buildup around the heart.  Thickening of the heart muscle.  A tumor or infectious growth around the heart valves. Tell a health care provider about:  Any allergies you have.  All medicines you are taking, including vitamins, herbs, eye drops, creams, and over-the-counter medicines.  Any blood disorders you have.  Any surgeries you have had.  Any medical conditions you have.  Whether you are pregnant or may be pregnant. What are the risks? Generally, this is a safe procedure. However, problems may occur, including:  Allergic reaction to dye (contrast) that may be used during the procedure. What happens before the procedure? No specific preparation is needed. You may eat and drink normally. What happens during the procedure?  An IV tube may be inserted into one of your veins.  You may receive contrast through this tube. A contrast is an injection that improves the quality of the pictures from your heart.  A gel will be applied to your chest.  A wand-like tool (transducer) will be moved over your chest. The gel will help to transmit the sound waves from the transducer.  The sound waves  will harmlessly bounce off of your heart to allow the heart images to be captured in real-time motion. The images will be recorded on a computer. The procedure may vary among health care providers and hospitals. What happens after the procedure?  You may return to your normal, everyday life, including diet, activities, and medicines, unless your health care provider tells you not to do that. Summary  An echocardiogram is a procedure that uses painless sound waves (ultrasound) to produce an image of the heart.  Images from an echocardiogram can provide important information about the size and shape of your heart, heart muscle function, heart valve function, and fluid buildup around your heart.  You do not need to do anything to prepare before this procedure. You may eat and drink normally.  After the echocardiogram is completed, you may return to your normal, everyday life, unless your health care provider tells you not to do that. This information is not intended to replace advice given to you by your health care provider. Make sure you discuss any questions you have with your health care provider. Document Revised: 06/23/2018 Document Reviewed: 04/04/2016 Elsevier Patient Education  2020 [employer].

## 2019-10-05 ENCOUNTER — Ambulatory Visit (INDEPENDENT_AMBULATORY_CARE_PROVIDER_SITE_OTHER): Payer: Medicaid Other

## 2019-10-05 ENCOUNTER — Other Ambulatory Visit: Payer: Self-pay

## 2019-10-05 DIAGNOSIS — R0600 Dyspnea, unspecified: Secondary | ICD-10-CM | POA: Diagnosis not present

## 2019-10-05 DIAGNOSIS — R000 Tachycardia, unspecified: Secondary | ICD-10-CM | POA: Diagnosis not present

## 2019-10-05 DIAGNOSIS — R079 Chest pain, unspecified: Secondary | ICD-10-CM

## 2019-10-05 DIAGNOSIS — I10 Essential (primary) hypertension: Secondary | ICD-10-CM | POA: Diagnosis not present

## 2019-10-05 LAB — ECHOCARDIOGRAM COMPLETE
AR max vel: 1.93 cm2
AV Area VTI: 2.06 cm2
AV Area mean vel: 2.24 cm2
AV Mean grad: 3 mmHg
AV Peak grad: 7.7 mmHg
Ao pk vel: 1.39 m/s
Area-P 1/2: 4.74 cm2
Calc EF: 66.1 %
S' Lateral: 2.45 cm
Single Plane A2C EF: 70.2 %
Single Plane A4C EF: 64 %

## 2019-10-09 ENCOUNTER — Telehealth: Payer: Self-pay | Admitting: Cardiology

## 2019-10-09 ENCOUNTER — Other Ambulatory Visit: Payer: Self-pay

## 2019-10-09 ENCOUNTER — Telehealth (INDEPENDENT_AMBULATORY_CARE_PROVIDER_SITE_OTHER): Payer: Medicaid Other | Admitting: Cardiology

## 2019-10-09 ENCOUNTER — Encounter: Payer: Self-pay | Admitting: Cardiology

## 2019-10-09 VITALS — HR 97 | Ht 62.0 in | Wt 132.0 lb

## 2019-10-09 DIAGNOSIS — R000 Tachycardia, unspecified: Secondary | ICD-10-CM | POA: Diagnosis not present

## 2019-10-09 DIAGNOSIS — R0600 Dyspnea, unspecified: Secondary | ICD-10-CM

## 2019-10-09 DIAGNOSIS — R079 Chest pain, unspecified: Secondary | ICD-10-CM

## 2019-10-09 DIAGNOSIS — I10 Essential (primary) hypertension: Secondary | ICD-10-CM

## 2019-10-09 MED ORDER — METOPROLOL TARTRATE 50 MG PO TABS: 75.0000 mg | ORAL_TABLET | Freq: Two times a day (BID) | ORAL | 5 refills | Status: DC

## 2019-10-09 NOTE — Telephone Encounter (Signed)
  Patient Consent for Virtual Visit         Kristy Reid has provided verbal consent on 10/09/2019 for a virtual visit (video or telephone).   CONSENT FOR VIRTUAL VISIT FOR:  Kristy Reid  By participating in this virtual visit I agree to the following:  I hereby voluntarily request, consent and authorize [facility] and its employed or contracted physicians, physician assistants, nurse practitioners or other licensed health care professionals (the Practitioner), to provide me with telemedicine health care services (the "Services") as deemed necessary by the treating Practitioner. I acknowledge and consent to receive the Services by the Practitioner via telemedicine. I understand that the telemedicine visit will involve communicating with the Practitioner through live audiovisual communication technology and the disclosure of certain medical information by electronic transmission. I acknowledge that I have been given the opportunity to request an in-person assessment or other available alternative prior to the telemedicine visit and am voluntarily participating in the telemedicine visit.  I understand that I have the right to withhold or withdraw my consent to the use of telemedicine in the course of my care at any time, without affecting my right to future care or treatment, and that the Practitioner or I may terminate the telemedicine visit at any time. I understand that I have the right to inspect all information obtained and/or recorded in the course of the telemedicine visit and may receive copies of available information for a reasonable fee.  I understand that some of the potential risks of receiving the Services via telemedicine include:  Marland Kitchen Delay or interruption in medical evaluation due to technological equipment failure or disruption; . Information transmitted may not be sufficient (e.g. poor resolution of images) to allow for appropriate medical decision making by the  Practitioner; and/or  . In rare instances, security protocols could fail, causing a breach of personal health information.  Furthermore, I acknowledge that it is my responsibility to provide information about my medical history, conditions and care that is complete and accurate to the best of my ability. I acknowledge that Practitioner's advice, recommendations, and/or decision may be based on factors not within their control, such as incomplete or inaccurate data provided by me or distortions of diagnostic images or specimens that may result from electronic transmissions. I understand that the practice of medicine is not an exact science and that Practitioner makes no warranties or guarantees regarding treatment outcomes. I acknowledge that a copy of this consent can be made available to me via my patient portal [facility] MyChart), or I can request a printed copy by calling the office of [facility].    I understand that my insurance will be billed for this visit.   I have read or had this consent read to me. . I understand the contents of this consent, which adequately explains the benefits and risks of the Services being provided via telemedicine.  . I have been provided ample opportunity to ask questions regarding this consent and the Services and have had my questions answered to my satisfaction. . I give my informed consent for the services to be provided through the use of telemedicine in my medical care

## 2019-10-09 NOTE — Progress Notes (Signed)
Virtual Visit via Telephone Note   This visit type was conducted due to national recommendations for restrictions regarding the COVID-19 Pandemic (e.g. social distancing) in an effort to limit this patient's exposure and mitigate transmission in our community.  Due to her co-morbid illnesses, this patient is at least at moderate risk for complications without adequate follow up.  This format is felt to be most appropriate for this patient at this time.  The patient did not have access to video technology/had technical difficulties with video requiring transitioning to audio format only (telephone).  All issues noted in this document were discussed and addressed.  No physical exam could be performed with this format.  Please refer to the patient's chart for her  consent to telehealth for [facility].   Date:  10/09/2019   ID:  Kristy Reid, DOB Aug 04, 1995, MRN [id number]  Patient Location: Home Provider Location: Office/Clinic  PCP:  [facility]  Cardiologist:  Debbe Odea, MD  Electrophysiologist:  None   Evaluation Performed:  Follow-Up Visit  Chief Complaint:  Follow up visit  History of Present Illness:    Kristy Reid is a 24 y.o. female with history of anxiety, bipolar disorder, hypertension being evaluated on a follow-up visit.  She was last seen due to chest pain, shortness of breath and rapid heart rates.  Previous work-up in the emergency room with chest CTA was negative for PE.  Also noted tachycardia with heart rates rarely less than 100 bpm.  Her Lopressor was increased to 50 mg twice daily.  Due to shortness of breath, echocardiogram was ordered.  She states her heart rates are still elevated ranging from 130s to 140s beats per minute.  She is compliant with metoprolol 50 mg twice daily.  She states having chest pain when she presses on her chest.  Also noted left-sided chest discomfort during her echocardiogram exam when the probe was  placed on her chest.  The patient does not have symptoms concerning for COVID-19 infection (fever, chills, cough, or new shortness of breath).    Past Medical History:  Diagnosis Date  . Anemia   . Anemia   . Anxiety   . Asthma   . Bipolar 1 disorder (HCC)   . Chronic back pain   . Depression   . Hypertension   . Insomnia   . Personality disorder (HCC)    "boarderline"  . PTSD (post-traumatic stress disorder)    Past Surgical History:  Procedure Laterality Date  . BUNIONECTOMY Bilateral    feet  . wrist sugery       Current Meds  Medication Sig  . acetaminophen (TYLENOL) 500 MG tablet Take 500-1,000 mg by mouth every 6 (six) hours as needed for mild pain or moderate pain.  Marland Kitchen albuterol (VENTOLIN HFA) 108 (90 Base) MCG/ACT inhaler Inhale 2 puffs into the lungs every 6 (six) hours as needed for wheezing or shortness of breath.  . fexofenadine-pseudoephedrine (ALLEGRA-D) 60-120 MG 12 hr tablet Take 1 tablet by mouth 2 (two) times daily.  . fluticasone (FLONASE) 50 MCG/ACT nasal spray Place 1 spray into both nostrils daily.  . hydrOXYzine (ATARAX/VISTARIL) 10 MG tablet Take 10 mg by mouth 3 (three) times daily as needed for itching or anxiety.  Marland Kitchen LORazepam (ATIVAN) 1 MG tablet Take 1 tablet (1 mg total) by mouth daily as needed for anxiety.  . meclizine (ANTIVERT) 25 MG tablet Take 25 mg by mouth 3 (three) times daily as needed for dizziness.  Marland Kitchen  metoprolol tartrate (LOPRESSOR) 50 MG tablet Take 1 tablet (50 mg total) by mouth 2 (two) times daily.  . QUEtiapine (SEROQUEL) 300 MG tablet Take 300 mg by mouth at bedtime.   . traZODone (DESYREL) 150 MG tablet Take 150 mg by mouth at bedtime.      Allergies:   Penicillins, Rocephin [ceftriaxone], Zithromax [azithromycin], and Lidocaine   Social History   Tobacco Use  . Smoking status: Former Smoker    Types: Cigarettes    Quit date: 08/13/2015    Years since quitting: 4.1  . Smokeless tobacco: Never Used  Vaping Use  . Vaping  Use: Never used  Substance Use Topics  . Alcohol use: No  . Drug use: Yes    Frequency: 7.0 times per week    Types: Marijuana    Comment: daily use, last use today 08/31/2019     Family Hx: The patient's family history includes Diabetes in her maternal grandmother and mother; Heart disease in her maternal grandmother; Hypertension in her mother; Mental illness in her mother. There is no history of Cancer or Ovarian cancer.  ROS:   Please see the history of present illness.     All other systems reviewed and are negative.   Prior CV studies:   The following studies were reviewed today:  TTE 10/05/2019 1. Left ventricular ejection fraction, by estimation, is 55 to 60%. The  left ventricle has normal function. The left ventricle has no regional  wall motion abnormalities. Left ventricular diastolic parameters were  normal.  2. Right ventricular systolic function is normal. The right ventricular  size is normal.  3. The mitral valve is normal in structure. No evidence of mitral valve  regurgitation. No evidence of mitral stenosis.  4. The aortic valve is normal in structure. Aortic valve regurgitation is  not visualized. No aortic stenosis is present.  5. The inferior vena cava is normal in size with greater than 50%  respiratory variability, suggesting right atrial pressure of 3 mmHg.   Labs/Other Tests and Data Reviewed:    EKG:  No ECG reviewed.  Recent Labs: 12/23/2018: ALT 22 08/27/2019: BUN 12; Creatinine, Ser 0.82; Hemoglobin 11.5; Platelets 335; Potassium 3.6; Sodium 138   Recent Lipid Panel Lab Results  Component Value Date/Time   CHOL 153 09/21/2017 01:46 AM   TRIG 74 09/21/2017 01:46 AM   HDL 46 09/21/2017 01:46 AM   CHOLHDL 3.3 09/21/2017 01:46 AM   LDLCALC 92 09/21/2017 01:46 AM    Wt Readings from Last 3 Encounters:  10/09/19 132 lb (59.9 kg)  08/31/19 150 lb (68 kg)  08/27/19 149 lb (67.6 kg)     Objective:    Vital Signs:  Pulse 97   Ht 5\' 2"   (1.575 m)   Wt 132 lb (59.9 kg)   BMI 24.14 kg/m    VITAL SIGNS:  reviewed  ASSESSMENT & PLAN:    1. Patient with nonspecific dyspnea.  Echocardiogram showed normal systolic and diastolic function, EF 55 to 60%. 2. Patient with very atypical chest discomfort, noncardiac in origin due to pain being reproducible with palpation.  Follow-up with PCP regarding trial of NSAIDs advised. 3. Patient with history of inappropriate sinus tachycardia.  Heart rate still elevated.  Increase Lopressor to 75 mg twice daily.  Anxiety may be playing a role with increased heart rates. 4. History of hypertension, continue Lopressor as prescribed.  COVID-19 Education: The signs and symptoms of COVID-19 were discussed with the patient and how to seek care  for testing (follow up with PCP or arrange E-visit).  The importance of social distancing was discussed today.  Time:   Today, I have spent 35 minutes with the patient with telehealth technology discussing the above problems.     Medication Adjustments/Labs and Tests Ordered: Current medicines are reviewed at length with the patient today.  Concerns regarding medicines are outlined above.   Tests Ordered: No orders of the defined types were placed in this encounter.   Medication Changes: No orders of the defined types were placed in this encounter.   Follow Up:  In Person in 3 month(s)  Signed, Debbe Odea, MD  10/09/2019 1:33 PM    [facility]

## 2019-10-09 NOTE — Patient Instructions (Signed)
Medication Instructions:  Your physician has recommended you make the following change in your medication:   INCREASE Metoprolol to 75 mg (1 an 1/2 tablet) twice daily. A Rx has been sent to your pharmacy.  *If you need a refill on your cardiac medications before your next appointment, please call your pharmacy*   Lab Work: None ordered If you have labs (blood work) drawn today and your tests are completely normal, you will receive your results only by: Marland Kitchen MyChart Message (if you have MyChart) OR . A paper copy in the mail If you have any lab test that is abnormal or we need to change your treatment, we will call you to review the results.   Testing/Procedures: None ordered   Follow-Up: At [facility], you and your health needs are our priority.  As part of our continuing mission to provide you with exceptional heart care, we have created designated Provider Care Teams.  These Care Teams include your primary Cardiologist (physician) and Advanced Practice Providers (APPs -  Physician Assistants and Nurse Practitioners) who all work together to provide you with the care you need, when you need it.  We recommend signing up for the patient portal called "MyChart".  Sign up information is provided on this After Visit Summary.  MyChart is used to connect with patients for Virtual Visits (Telemedicine).  Patients are able to view lab/test results, encounter notes, upcoming appointments, etc.  Non-urgent messages can be sent to your provider as well.   To learn more about what you can do with MyChart, go to [email or web address].    Your next appointment:   3 month(s)  The format for your next appointment:   In Person  Provider:    You may see Debbe Odea, MD or one of the following Advanced Practice Providers on your designated Care Team:    Nicolasa Ducking, NP  Eula Listen, PA-C  Marisue Ivan, PA-C    Other Instructions N/A

## 2019-11-19 ENCOUNTER — Inpatient Hospital Stay
Admit: 2019-11-19 | Discharge: 2019-11-19 | Disposition: A | Discharge status: Home / Self Care | Payer: Medicaid Other | Attending: Emergency Medicine

## 2019-11-19 DIAGNOSIS — N939 Abnormal uterine and vaginal bleeding, unspecified: Secondary | ICD-10-CM

## 2019-11-19 LAB — URINALYSIS WITH REFLEX TO CULTURE
Bilirubin Urine: NEGATIVE
Glucose, Ur: NEGATIVE
Nitrite, Urine: NEGATIVE
Specific Gravity, UA: 1.032 — ABNORMAL HIGH (ref 1.005–1.030)
Urobilinogen, Urine: NORMAL
pH, UA: 6 (ref 5.0–8.0)

## 2019-11-19 LAB — MICROSCOPIC URINALYSIS
Casts UA: 2 /LPF (ref 0–8)
Epithelial Cells UA: 2 /HPF (ref 0–5)
WBC, UA: 5 /HPF (ref 0–5)

## 2019-11-19 LAB — VAGINITIS DNA PROBE
Direct Exam: NEGATIVE
Direct Exam: NEGATIVE
Direct Exam: NEGATIVE

## 2019-11-19 LAB — PREGNANCY, URINE: HCG(Urine) Pregnancy Test: NEGATIVE

## 2019-11-19 IMAGING — CR DG CERVICAL SPINE 2 OR 3 VIEWS
5 series · 5 of 5 positions shown · non-contrast
Comparison: Cervical spine radiograph 09/08/2017

CLINICAL DATA: Fall in the shower striking head, cervical neck
pain.

EXAM:
CERVICAL SPINE - 2-3 VIEW

[c-spine lat]
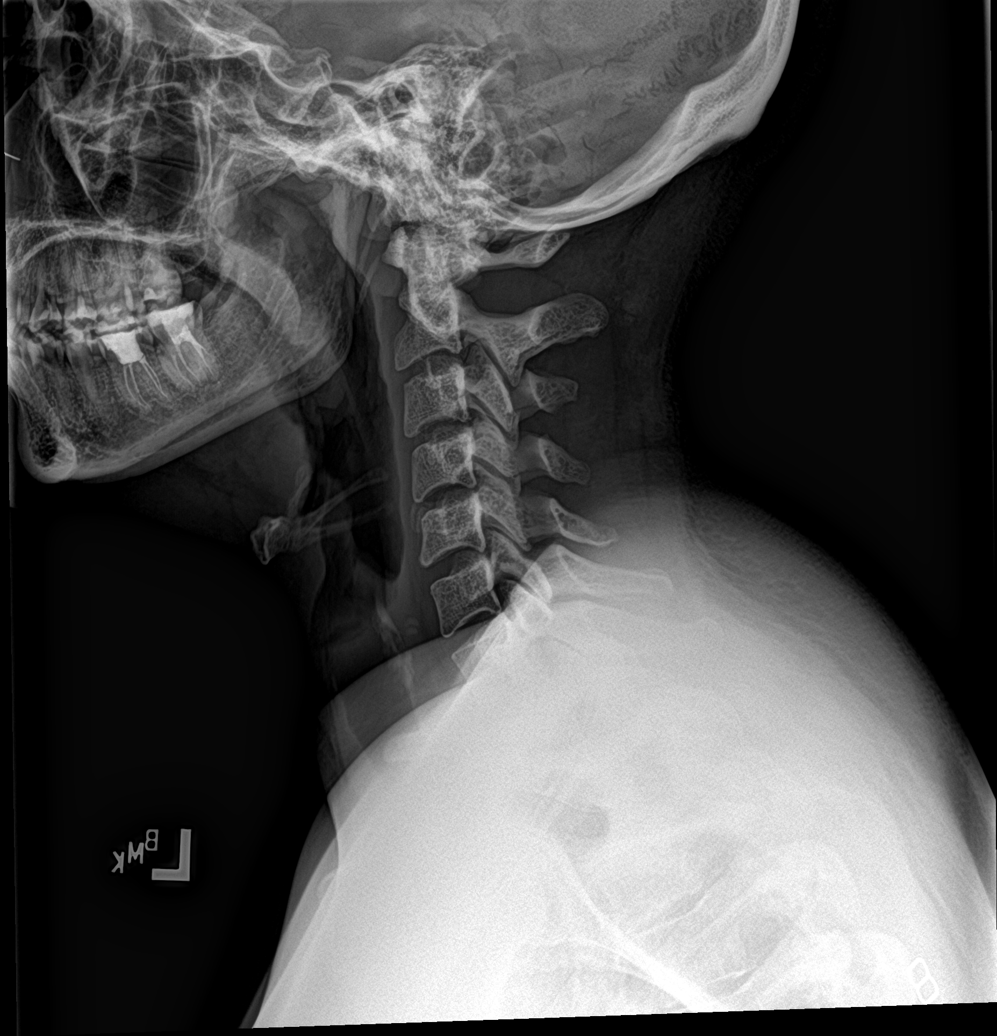

[c-spine ap]
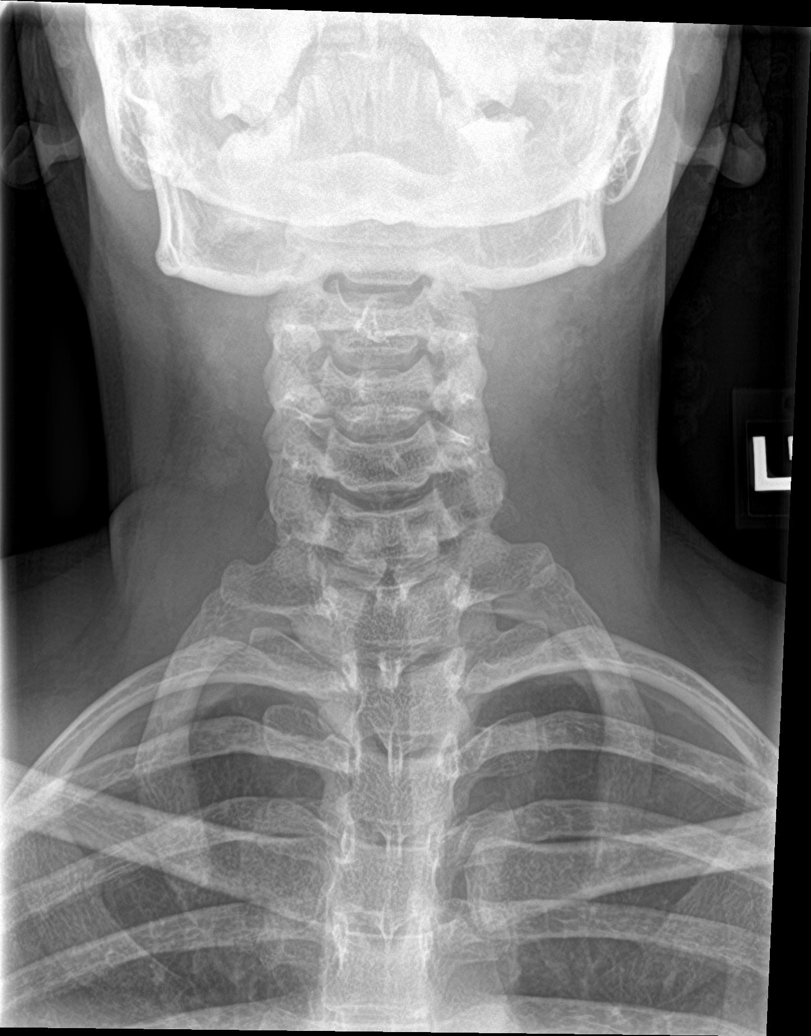

[c-spine open mouth]
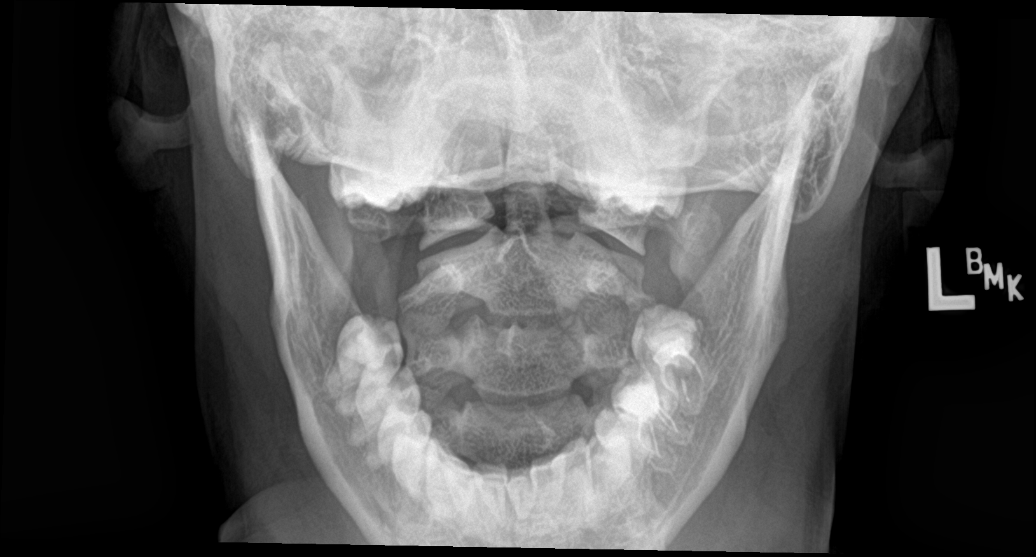

[c-spine swimmers]
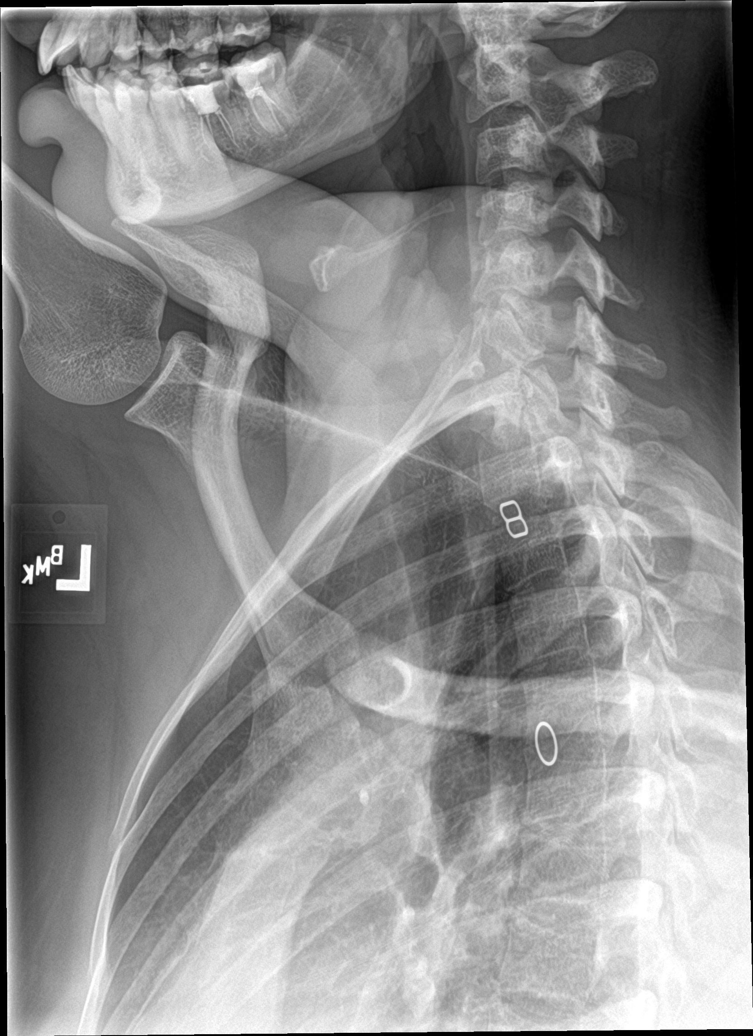

[[person_name]]
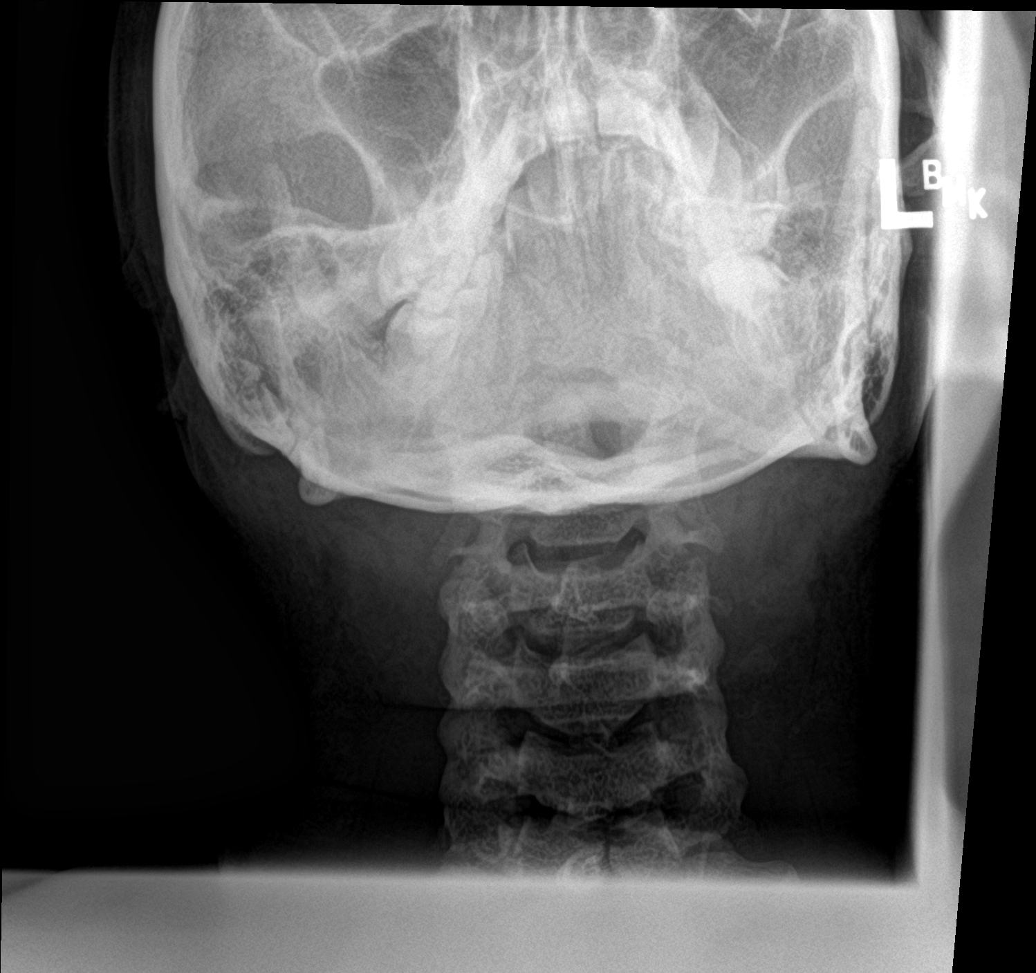

[5 of 5 positions shown; findings below may reference images not displayed]

FINDINGS: Cervical spine alignment is maintained. Vertebral body heights and
intervertebral disc spaces are preserved. The dens is intact.
Posterior elements appear well-aligned. There is no evidence of
fracture. No prevertebral soft tissue edema.
IMPRESSION: Negative radiographs of the cervical spine.

## 2019-11-19 MED ORDER — IBUPROFEN 800 MG PO TABS
800 MG | ORAL_TABLET | Freq: Two times a day (BID) | ORAL | 0 refills | Status: AC | PRN
Start: 2019-11-19 — End: 2019-12-03

## 2019-11-19 MED ORDER — ACETAMINOPHEN 500 MG PO TABS
500 MG | ORAL_TABLET | Freq: Three times a day (TID) | ORAL | 0 refills | Status: AC
Start: 2019-11-19 — End: 2020-04-27

## 2019-11-19 MED ORDER — IBUPROFEN 800 MG PO TABS
800 MG | Freq: Once | ORAL | Status: AC
Start: 2019-11-19 — End: 2019-11-19
  Administered 2019-11-19: 17:00:00 800 mg via ORAL

## 2019-11-19 MED ORDER — DICYCLOMINE HCL 10 MG PO CAPS
10 MG | Freq: Once | ORAL | Status: AC
Start: 2019-11-19 — End: 2019-11-19
  Administered 2019-11-19: 19:00:00 10 mg via ORAL

## 2019-11-19 MED ORDER — POLYETHYLENE GLYCOL 3350 17 G PO PACK
17 g | Freq: Every day | ORAL | 0 refills | Status: AC | PRN
Start: 2019-11-19 — End: 2019-11-26

## 2019-11-19 MED ORDER — ACETAMINOPHEN 500 MG PO TABS
500 MG | Freq: Once | ORAL | Status: AC
Start: 2019-11-19 — End: 2019-11-19
  Administered 2019-11-19: 17:00:00 1000 mg via ORAL

## 2019-11-19 MED FILL — IBUPROFEN 800 MG PO TABS: 800 MG | ORAL | Qty: 1

## 2019-11-19 MED FILL — MAPAP 500 MG PO TABS: 500 MG | ORAL | Qty: 2

## 2019-11-19 MED FILL — DICYCLOMINE HCL 10 MG PO CAPS: 10 MG | ORAL | Qty: 1

## 2019-11-19 NOTE — ED Provider Notes (Signed)
[facility] ED     Emergency Department     Faculty Attestation        I performed a history and physical examination of the patient and discussed management with the resident. I reviewed the resident???s note and agree with the documented findings and plan of care. Any areas of disagreement are noted on the chart. I was personally present for the key portions of any procedures. I have documented in the chart those procedures where I was not present during the key portions. I have reviewed the emergency nurses triage note. I agree with the chief complaint, past medical history, past surgical history, allergies, medications, social and family history as documented unless otherwise noted below.  For Physician Assistant/ Nurse Practitioner cases/documentation I have personally evaluated this patient and have completed at least one if not all key elements of the E/M (history, physical exam, and MDM). Additional findings are as noted.    Vital Signs: BP: 135/86   Pulse: 110   Resp: 18   Temp: 98.6 ??F (37 ??C)  SpO2: 98 %  PCP:  Wilmer Floor, MD    Pertinent Comments:         Critical Care  None    This patient was evaluated in the Emergency Department for symptoms described in the history of present illness. He/she was evaluated in the context of the global COVID-19 pandemic, which necessitated consideration that the patient might be at risk for infection with the SARS-CoV-2 virus that causes COVID-19. Institutional protocols and algorithms that pertain to the evaluation of patients at risk for COVID-19 are in a state of rapid change based on information released by regulatory bodies including the CDC and federal and state organizations. These policies and algorithms were followed during the patient's care in the ED.    (Please note that portions of this note were completed with a voice recognition program. Efforts were made to edit the dictations but occasionally  words are mis-transcribed. Whenever words are used in this note in any gender, they shall be construed as though they were used in the gender appropriate to the circumstances; and whenever words are used in this note in the singular or plural form, they shall be construed as though they were used in the form appropriate to the circumstances.)    Chevis Pretty, MD Lacie Scotts  Attending Emergency Medicine Physician             Salomon Mast, MD  11/19/19 [phone]

## 2019-11-19 NOTE — Discharge Instructions (Addendum)
Call today or tomorrow to follow up with Wilmer Floor, MD  or your OB / GYN in 7 days    Potential miscarriage has not been ruled out in your case of pregnancy.   Use ibuprofen or Tylenol (unless prescribed medications that have Tylenol in it) for pain.  You can take over the counter Ibuprofen (advil) tablets (4 tablets every 8 hours or 3 tablets every 6 hours or 2 tablets every 4 hours)    Return to the Emergency Department for worsening of vaginal bleeding, using more than 4 pads per hour, feeling of weakness, dizzy, nausea / vomiting, any other care or concern.    THANK YOU!!!    From [facility] Emergency Department    On behalf of the Emergency Department staff at [facility], I would like to thank you for giving [facility] Oswaldo Done the opportunity to address your health care needs and concerns.    We hope that during your visit, our service was delivered in a professional and caring manner. Please keep [facility] Oswaldo Done in mind as we walk with you down the path to your own personal wellness.     Please expect an automated phone call from [phone] so we can ask a few questions about your health and progress. Based on your answers, a clinician may call you back to offer help and instructions.

## 2019-11-19 NOTE — ED Provider Notes (Signed)
[facility] ED  Emergency Department Encounter  EmergencyMedicine Resident     Pt Name:Brenda Zamora  MRN: [id number]  Birthdate 08-19-1995  Date of evaluation: 11/19/19  PCP:  Wilmer Floor, MD    CHIEF COMPLAINT       Chief Complaint   Patient presents with   ??? Vaginal Bleeding     heavy vaginal bleeding. started yesterday, now passing clots. LMP 2 months ago.       HISTORY OF PRESENT ILLNESS  (Location/Symptom, Timing/Onset, Context/Setting, Quality, Duration, Modifying Factors, Severity.)      Brenda Zamora is a 24 y.o. female, who was pregnant past medical history of G4 P3-0-1-3, who presents with vaginal bleeding.Vaginal bleeding.  Patient notes that for the 2 days ago she had some brown "creamy" discharge and notes that yet since yesterday she is been having "tons of vaginal bleeding just cramping pain bilateral lower abdominal.  She notes that she could be pregnant.  She is reporting that she woke up "this morning and it looked like a murder scene" on a fairly object to sleep.  She notes that she is going through "multiple pads" was not able to determine how many that were if they are soaking through or not.  She notes that she is not having any fevers, chills, chest pain, shortness breath, vomiting, diarrhea/constipation, dysuria, numbness tingling or weakness.     In addition she notes that she has a past medical history of persistent tachycardia into the 110s to 130s at baseline and is post be on medications to help lower it.  She notes that she is being worked up for currently but at this time have no idea why this is happening to her.     PAST MEDICAL / SURGICAL / SOCIAL / FAMILY HISTORY      has a past medical history of Anemia, Asthma, Bipolar 1 disorder (HCC), Borderline personality disorder (HCC), Depression, Gestational HTN, History of blood transfusion, Hx PTSD, Insomnia, PTSD (post-traumatic stress disorder), Schizophrenia (HCC), Sciatica, Seizure in childhood (HCC), and  Vertigo.       has a past surgical history that includes Bunionectomy and Wrist surgery.      Social History     Socioeconomic History   ??? Marital status: Single     Spouse name: Not on file   ??? Number of children: Not on file   ??? Years of education: Not on file   ??? Highest education level: Not on file   Occupational History   ??? Not on file   Tobacco Use   ??? Smoking status: Former Smoker   ??? Smokeless tobacco: Never Used   ??? Tobacco comment: pt quit when she learned she was preg    Vaping Use   ??? Vaping Use: Never used   Substance and Sexual Activity   ??? Alcohol use: Not Currently   ??? Drug use: Yes     Frequency: 7.0 times per week     Types: Marijuana     Comment: pt last used approx 11/2017    ??? Sexual activity: Yes     Partners: Male   Other Topics Concern   ??? Not on file   Social History Narrative   ??? Not on file     Social Determinants of Health     Financial Resource Strain:    ??? Difficulty of Paying Living Expenses:    Food Insecurity:    ??? Worried About Running Out of Food in the Last Year:    ???  Ran Out of Food in the Last Year:    Transportation Needs:    ??? Lack of Transportation (Medical):    ??? Lack of Transportation (Non-Medical):    Physical Activity:    ??? Days of Exercise per Week:    ??? Minutes of Exercise per Session:    Stress:    ??? Feeling of Stress :    Social Connections:    ??? Frequency of Communication with Friends and Family:    ??? Frequency of Social Gatherings with Friends and Family:    ??? Attends Religious Services:    ??? Database administrator or Organizations:    ??? Attends Engineer, structural:    ??? Marital Status:    Intimate Programme researcher, broadcasting/film/video Violence:    ??? Fear of Current or Ex-Partner:    ??? Emotionally Abused:    ??? Physically Abused:    ??? Sexually Abused:        Family History   Problem Relation Age of Onset   ??? Diabetes Mother    ??? Hypertension Mother    ??? Diabetes Father    ??? Hypertension Father    ??? Crohn's Disease Father    ??? Other Father    ??? Other Brother         Pulmonary Hypertension  in one brother   ??? Diabetes Maternal Grandmother    ??? Other Maternal Grandmother    ??? Diabetes Paternal Grandmother    ??? Lupus Sister         2 sisters with lupus   1 with HTN and diabetes       Allergies:  Rocephin [ceftriaxone], Penicillins, Lidocaine viscous hcl, and Zithromax [azithromycin]    Home Medications:  Prior to Admission medications    Medication Sig Start Date End Date Taking? Authorizing Provider   polyethylene glycol (MIRALAX) 17 g packet Take 17 g by mouth daily as needed for Constipation 11/19/19 11/26/19 Yes Randie Heinz, DO   ibuprofen (ADVIL;MOTRIN) 800 MG tablet Take 1 tablet by mouth 2 times daily as needed for Pain 11/19/19 12/03/19 Yes Randie Heinz, DO   acetaminophen (TYLENOL) 500 MG tablet Take 2 tablets by mouth 3 times daily for 14 days 11/19/19 12/03/19 Yes Randie Heinz, DO   ferrous sulfate (FE TABS) 325 (65 Fe) MG EC tablet Take 1 tablet by mouth 2 times daily 07/23/18   Waylan Rocher, DO   docusate sodium (COLACE, DULCOLAX) 100 MG CAPS Take 100 mg by mouth 2 times daily 07/22/18   Christel Mormon Kooten, DO   aspirin EC 81 MG EC tablet Take 1 tablet by mouth daily 02/08/18   Alver Fisher, DO   docusate sodium (COLACE) 100 MG capsule Take 100 mg by mouth 2 times daily as needed for Constipation    Historical Provider, MD       REVIEW OF SYSTEMS    (2-9 systems for level 4, 10 or more for level 5)      Review of Systems   Constitutional: Negative for chills, diaphoresis, fatigue and fever.   HENT: Negative for congestion and sore throat.    Eyes: Negative for visual disturbance.   Respiratory: Negative for cough and shortness of breath.    Cardiovascular: Negative for palpitations and leg swelling.   Gastrointestinal: Negative for abdominal pain, blood in stool, constipation, diarrhea, nausea and vomiting.   Endocrine: Negative for polydipsia, polyphagia and polyuria.   Genitourinary: Negative for difficulty urinating, dysuria, flank pain,  frequency, hematuria, vaginal bleeding and  vaginal discharge.   Musculoskeletal: Negative for arthralgias and myalgias.   Skin: Negative for rash and wound.   Neurological: Negative for weakness, light-headedness and numbness.       PHYSICAL EXAM   (up to 7 for level 4, 8 or more for level 5)      INITIAL VITALS:   BP 135/86    Pulse 110    Temp 98.6 ??F (37 ??C) (Tympanic)    Resp 18    Ht  (1.575 m)    Wt 146 lb (66.2 kg)    LMP 09/18/2019    SpO2 98%    BMI 26.70 kg/m??     Physical Exam  Vitals and nursing note reviewed. Exam conducted with a chaperone present Mamie Levers, RN).   Constitutional:       General: She is not in acute distress.     Appearance: She is well-developed. She is not diaphoretic.   HENT:      Head: Normocephalic and atraumatic.      Right Ear: External ear normal.      Left Ear: External ear normal.      Nose: Nose normal.      Mouth/Throat:      Mouth: Mucous membranes are moist.      Pharynx: Oropharynx is clear.   Eyes:      Extraocular Movements: Extraocular movements intact.      Conjunctiva/sclera: Conjunctivae normal.      Pupils: Pupils are equal, round, and reactive to light.   Neck:      Vascular: No JVD.      Trachea: No tracheal deviation.   Cardiovascular:      Rate and Rhythm: Normal rate and regular rhythm.      Pulses: Normal pulses.      Heart sounds: Normal heart sounds, S1 normal and S2 normal. No murmur heard.   No friction rub. No gallop.    Pulmonary:      Effort: Pulmonary effort is normal. No respiratory distress.      Breath sounds: Normal breath sounds.   Abdominal:      General: Abdomen is flat. There is no distension.      Palpations: Abdomen is soft.      Tenderness: There is no abdominal tenderness. There is no guarding or rebound.   Genitourinary:     Exam position: Supine.      Labia:         Right: No rash, tenderness, lesion or injury.         Left: No rash, tenderness, lesion or injury.       Vagina: No signs of injury and foreign body. Bleeding present. No vaginal discharge, erythema,  tenderness, lesions or prolapsed vaginal walls.      Cervix: Cervical bleeding (oozing) present. No cervical motion tenderness, discharge, friability, lesion, erythema or eversion.      Uterus: Normal.       Adnexa: Right adnexa normal.        Right: No mass, tenderness or fullness.          Left: Tenderness present. No mass or fullness.     Musculoskeletal:         General: No tenderness. Normal range of motion.      Cervical back: Normal range of motion and neck supple. No rigidity.   Skin:     General: Skin is warm and dry.      Capillary Refill: Capillary  refill takes less than 2 seconds.   Neurological:      General: No focal deficit present.      Mental Status: She is alert and oriented to person, place, and time.      Motor: No abnormal muscle tone.         DIFFERENTIAL  DIAGNOSIS     PLAN (LABS / IMAGING / EKG):  Orders Placed This Encounter   Procedures   ??? VAGINITIS DNA PROBE   ??? C.trachomatis N.gonorrhoeae DNA   ??? Urinalysis Reflex to Culture   ??? PREGNANCY, URINE   ??? Microscopic Urinalysis   ??? Vaginal exam       MEDICATIONS ORDERED:  Orders Placed This Encounter   Medications   ??? ibuprofen (ADVIL;MOTRIN) tablet 800 mg   ??? acetaminophen (TYLENOL) tablet 1,000 mg   ??? dicyclomine (BENTYL) capsule 10 mg   ??? polyethylene glycol (MIRALAX) 17 g packet     Sig: Take 17 g by mouth daily as needed for Constipation     Dispense:  7 each     Refill:  0   ??? ibuprofen (ADVIL;MOTRIN) 800 MG tablet     Sig: Take 1 tablet by mouth 2 times daily as needed for Pain     Dispense:  28 tablet     Refill:  0   ??? acetaminophen (TYLENOL) 500 MG tablet     Sig: Take 2 tablets by mouth 3 times daily for 14 days     Dispense:  84 tablet     Refill:  0       DDX: Vaginitis probe, UTI, pregnancy, gonorrhea/chlamydia, dysfunctional uterine bleeding    DIAGNOSTIC RESULTS / EMERGENCY DEPARTMENT COURSE / MDM   LAB RESULTS:  Results for orders placed or performed during the hospital encounter of 11/19/19   VAGINITIS DNA PROBE    Specimen:  Vaginal   Result Value Ref Range    Specimen Description .VAGINA     Special Requests NOT REPORTED     Direct Exam NEGATIVE for Candida sp.     Direct Exam NEGATIVE for Gardnerella vaginalis     Direct Exam NEGATIVE for Trichomonas vaginalis     Direct Exam       Method of testing is a DNA probe intended for detection and identification of Candida species, Gardnerella vaginalis, and Trichomonas vaginalis nucleic acid in vaginal fluid specimens from patients with symptoms of vaginitis/vaginosis.   Urinalysis Reflex to Culture    Specimen: Urine, clean catch   Result Value Ref Range    Color, UA DARK YELLOW (A) YELLOW    Turbidity UA CLEAR CLEAR    Glucose, Ur NEGATIVE NEGATIVE    Bilirubin Urine NEGATIVE NEGATIVE    Ketones, Urine TRACE (A) NEGATIVE    Specific Gravity, UA 1.032 (H) 1.005 - 1.030    Urine Hgb LARGE (A) NEGATIVE    pH, UA 6.0 5.0 - 8.0    Protein, UA 1+ (A) NEGATIVE    Urobilinogen, Urine Normal Normal    Nitrite, Urine NEGATIVE NEGATIVE    Leukocyte Esterase, Urine SMALL (A) NEGATIVE    Urinalysis Comments NOT REPORTED    PREGNANCY, URINE   Result Value Ref Range    HCG(Urine) Pregnancy Test NEGATIVE NEGATIVE   Microscopic Urinalysis   Result Value Ref Range    -          WBC, UA 5 TO 10 0 - 5 /HPF    RBC, UA TOO NUMEROUS TO COUNT 0 -  4 /HPF    Casts UA  0 - 8 /LPF     2 TO 5 HYALINE Reference range defined for non-centrifuged specimen.    Crystals, UA NOT REPORTED None /HPF    Epithelial Cells UA 2 TO 5 0 - 5 /HPF    Renal Epithelial, UA NOT REPORTED 0 /HPF    Bacteria, UA NOT REPORTED None    Mucus, UA NOT REPORTED None    Trichomonas, UA NOT REPORTED None    Amorphous, UA NOT REPORTED None    Other Observations UA NOT REPORTED NOT REQ.    Yeast, UA NOT REPORTED None       IMPRESSION: 24 year old female presents for vaginal bleeding.  Patient appears to be no acute distress and nontoxic-appearing.  Patient's initial vitals show tachycardia of 110 however per patient this is her baseline, and  resolved on my examination.  No respiratory distress.  Abdomen is tender in the left lower quadrant with no peritoneal signs.  On examination of the vaginal there is blood and oozing from the os with no other signs of discharge or trauma.  On exam there is tenderness of the uterus and left adnexal with no right adnexal tenderness and no obvious mass felt on examination.  Concern for above differential diagnosis.  Order urinalysis, along with vaginitis probe, gonorrhea/committee, and a urine pregnancy test.  Possible discharge.     RADIOLOGY:  None    EKG  None    All EKG's are interpreted by the Emergency Department Physician who either signs or Co-signs this chart in the absence of a cardiologist.    EMERGENCY DEPARTMENT COURSE:  ED Course as of Nov 19 1907   Wynelle Link Nov 19, 2019   1249 Patient is not pregnant. In addition urinalysis not concerning for infection.    [CS]      ED Course User Index  [CS] Randie Heinz, DO     Patient notes that she did feel improved after receiving Tylenol and ibuprofen.  Patient notes that she still having some abdominal cramping.  Patient was given some Bentyl.  Patient notes that she is also concerned that she may have constipation later and was requesting MiraLAX.  Patient agreeable discharge plan.  Patient is educated return precautions.  Patient verbalized understanding to return for any worsening symptoms to follow-up with OB/GYN soon as possible or the ER if she is any concerns.  Patient ambulate out of the ER without difficulty.    Of note there is no concerns for torsion as patient is resting comfortably in the bed room and is not writhing around in agony or requesting any pain medications at this time.  In addition there is no concern for TOA as patient does not appear toxic and is again resting comfortably in bed.    PROCEDURES:  None    CONSULTS:  None    CRITICAL CARE:  Please see attending note    FINAL IMPRESSION      1. Abnormal vaginal bleeding          DISPOSITION /  PLAN     DISPOSITION Decision To Discharge 11/19/2019 02:22:10 PM      PATIENT REFERRED TO:  Wilmer Floor, MD  [address]  [facility] [address]  [phone]    Schedule an appointment as soon as possible for a visit in 1 week  for reevaluation regarding this visit    [facility] ED  [address]  [place] [address]  [phone]  Go to   If symptoms worsen      DISCHARGE MEDICATIONS:  Discharge Medication List as of 11/19/2019  2:24 PM      START taking these medications    Details   polyethylene glycol (MIRALAX) 17 g packet Take 17 g by mouth daily as needed for Constipation, Disp-7 each, R-0Print      ibuprofen (ADVIL;MOTRIN) 800 MG tablet Take 1 tablet by mouth 2 times daily as needed for Pain, Disp-28 tablet, R-0Print      acetaminophen (TYLENOL) 500 MG tablet Take 2 tablets by mouth 3 times daily for 14 days, Disp-84 tablet, R-0Print             Randie Heinz, DO  Emergency Medicine Resident    (Please note that portions of thisnote were completed with a voice recognition program.  Efforts were made to edit the dictations but occasionally words are mis-transcribed.)       Randie Heinz, DO  Resident  11/19/19 [phone]

## 2019-11-21 LAB — C.TRACHOMATIS N.GONORRHOEAE DNA
C. trachomatis DNA: NEGATIVE
N. gonorrhoeae DNA: NEGATIVE

## 2019-12-31 ENCOUNTER — Emergency Department: Payer: Medicaid Other | Primary: Family Medicine

## 2019-12-31 DIAGNOSIS — R1030 Lower abdominal pain, unspecified: Secondary | ICD-10-CM

## 2019-12-31 NOTE — ED Notes (Signed)
Patient provided with discharge instructions and follow up information. Verbalized understanding. IV discontinued and dry dressing in place. A&OX3. Steady gait noted at discharge. Wheelchair declined by patient. Patient stated that she needed to leave for her children and is aware that she is leaving against medical advice and her results are not back yet. Patient states that she will return to the ED if needed.      Heide Spark, RN  12/31/19 2252

## 2019-12-31 NOTE — ED Provider Notes (Signed)
[facility]  [phone] [facility] [address]  [facility] [place] [address]  Phone: [phone]  Fax: [phone]      Pt Name: Brenda Zamora  TIR:[id number]  Birthdate Nov 16, 1995  Date of evaluation: 12/31/2019      CHIEF COMPLAINT       Chief Complaint   Patient presents with   ??? Chest Pain     for 3-4 days, nausea and vomiting today   ??? Headache   ??? Abdominal Pain     cramping started yesterday, lower abdomen   ??? Pregnancy Test     positive pregnancy test at home, has not been to OB yet G5P3       HISTORY OF PRESENT ILLNESS   Brenda Zamora is a 24 y.o. female with history of schizophrenia, borderline personality disorder, bipolar, anemia, asthma, hypertension, constipation, and seasonal allergies who presents for evaluation of chest pain, abdominal pain and headache.  The patient reports that starting approximately 3 to 4 days ago she developed a gradual onset, constant, dull, achy, nonradiating, global headache and sharp, stabbing, midsternal chest pain that radiates to her right shoulder intermittently.  The patient states that she has had similar headache and chest pain in the past and has been worked up by her PCP for this.  Her symptoms have been attributed to her hypertension and chronic tachycardia in the past and she recently had her metoprolol increased.  The patient states that her PCP is also currently working her up for easy bruising, neck pain and tingling to her extremities.  The patient states that starting yesterday she developed gradual onset, constant, dull, achy, nonradiating, lower abdominal pain.  She states that she took a home pregnancy test approximately 1 month ago which was positive.  She states that she was not sure if it was an accurate test so she plans on making an appointment with her doctor tomorrow.  The patient states that her last menstrual period was on 12/17/2019 and it only lasted 3 days which is unusual for her.  She denies any abnormal vaginal discharge.  The patient states that  she has been taking Tylenol and ibuprofen for her pain without improvement.  She does not list any provoking or palliating factors.  If she is currently pregnant she would be a G5 P2-1-1-3.  [facility] but has not yet made an appointment.  The patient states that she has been off of all of her psychiatric medications for 2 years because she was told to stop taking them during her third trimester of her last pregnancy and did not start taking them afterwards.  Patient states that the only medications she takes at this time are Claritin, Flonase and metoprolol.  She denies fever, chills, vision changes, abdominal injury, urinary/bowel symptoms, focal weakness, recent injury or illness.    REVIEW OF SYSTEMS     Ten point review of systems was reviewed and is negative unless otherwise noted in the HPI    PAST MEDICAL HISTORY    has a past medical history of Anemia, Asthma, Bipolar 1 disorder (HCC), Borderline personality disorder (HCC), Depression, Gestational HTN, History of blood transfusion, Hx PTSD, Insomnia, PTSD (post-traumatic stress disorder), Schizophrenia (HCC), Sciatica, Seizure in childhood (HCC), and Vertigo.    SURGICAL HISTORY      has a past surgical history that includes Bunionectomy and Wrist surgery.    CURRENT MEDICATIONS       Previous Medications    ACETAMINOPHEN (TYLENOL) 500  MG TABLET    Take 2 tablets by mouth 3 times daily for 14 days    ASPIRIN EC 81 MG EC TABLET    Take 1 tablet by mouth daily    DOCUSATE SODIUM (COLACE) 100 MG CAPSULE    Take 100 mg by mouth 2 times daily as needed for Constipation    DOCUSATE SODIUM (COLACE, DULCOLAX) 100 MG CAPS    Take 100 mg by mouth 2 times daily    FERROUS SULFATE (FE TABS) 325 (65 FE) MG EC TABLET    Take 1 tablet by mouth 2 times daily    FLUTICASONE (VERAMYST) 27.5 MCG/SPRAY NASAL SPRAY    2 sprays by Each Nostril route daily    IBUPROFEN (ADVIL;MOTRIN) 800 MG TABLET    Take 1 tablet by mouth 2 times daily as  needed for Pain    LORATADINE (CLARITIN) 10 MG TABLET    Take 10 mg by mouth daily    METOPROLOL SUCCINATE (TOPROL XL) 50 MG EXTENDED RELEASE TABLET    Take 50 mg by mouth daily       ALLERGIES     is allergic to rocephin [ceftriaxone], penicillins, lidocaine viscous hcl, and zithromax [azithromycin].    FAMILY HISTORY     She indicated that her mother is alive. She indicated that her father is alive. She indicated that her sister is alive. She indicated that her brother is alive. She indicated that her maternal grandmother is alive. She indicated that her maternal grandfather is alive. She indicated that her paternal grandmother is deceased. She indicated that her paternal grandfather is alive.     family history includes Crohn's Disease in her father; Diabetes in her father, maternal grandmother, mother, and paternal grandmother; Hypertension in her father and mother; Lupus in her sister; Other in her brother, father, and maternal grandmother.    SOCIAL HISTORY      reports that she has quit smoking. She has never used smokeless tobacco. She reports previous alcohol use. She reports current drug use. Frequency: 7.00 times per week. Drug: Marijuana.    PHYSICAL EXAM     INITIAL VITALS:  height is 5\' 2"  (1.575 m) and weight is 70.8 kg (156 lb). Her oral temperature is 98.3 ??F (36.8 ??C). Her blood pressure is 118/81 and her pulse is 96. Her respiration is 16 and oxygen saturation is 99%.     CONSTITUTIONAL: no apparent distress, well appearing  SKIN: warm, dry, no jaundice, hives or petechiae  EYES: clear conjunctiva, non-icteric sclera, pupils 3 mm equal round and reactive to light, extraocular movements intact  HENT: normocephalic, atraumatic, moist mucus membranes  NECK: Nontender and supple with no nuchal rigidity, full range of motion  PULMONARY: clear to auscultation without wheezes, rhonchi, or rales, normal excursion, no accessory muscle use and no stridor  CARDIOVASCULAR: regular rate, rhythm. Strong radial  pulses with intact distal perfusion. Capillary refill <2 seconds.  GASTROINTESTINAL: soft, lower abdominal tenderness to palpation,  non-distended, no palpable masses, no rebound or guarding   GENITOURINARY: No costovertebral angle tenderness to palpation  MUSCULOSKELETAL: No midline spinal tenderness, step off or deformity. Extremities are otherwise nontender to palpation and nonerythematous. Compartments soft. No peripheral edema.  NEUROLOGIC: alert and oriented x 3, GCS 15, normal mentation and speech. Moves all extremities x 4 without motor or sensory deficit, gait is stable without ataxia.  Cranial nerves II through XII intact.  No cerebellar signs.  No pronator drift.  Normal finger-to-nose.  PSYCHIATRIC: normal mood and  affect, thought process is clear and linear    DIAGNOSTIC RESULTS     EKG:  EKG 2155 sinus rhythm, rate 77 bpm, normal axis, normal intervals, no ST elevation or depression, no T wave inversions, good R wave progression, no Q waves, improved from EKG on 07/21/2018 which showed sinus tachycardia, otherwise unchanged EKG    RADIOLOGY:   Pending    LABS:  Results for orders placed or performed during the hospital encounter of 12/31/19   CBC Auto Differential   Result Value Ref Range    WBC 4.7 3.5 - 11.0 k/uL    RBC 4.18 4.0 - 5.2 m/uL    Hemoglobin 10.7 (L) 12.0 - 16.0 g/dL    Hematocrit 09.8 (L) 36 - 46 %    MCV 79.9 (L) 80 - 100 fL    MCH 25.6 (L) 26 - 34 pg    MCHC 32.0 31 - 37 g/dL    RDW 11.9 (H) 14.7 - 15.4 %    Platelets 322 140 - 450 k/uL    MPV 9.7 6.0 - 12.0 fL    NRBC Automated NOT REPORTED per 100 WBC    Differential Type NOT REPORTED     Seg Neutrophils 29 (L) 36 - 66 %    Lymphocytes 59 (H) 24 - 44 %    Monocytes 9 2 - 11 %    Eosinophils % 2 1 - 4 %    Basophils 1 0 - 2 %    Immature Granulocytes NOT REPORTED 0 %    Segs Absolute 1.40 (L) 1.8 - 7.7 k/uL    Absolute Lymph # 2.70 1.0 - 4.8 k/uL    Absolute Mono # 0.40 0.1 - 1.2 k/uL    Absolute Eos # 0.10 0.0 - 0.4 k/uL    Basophils  Absolute 0.00 0.0 - 0.2 k/uL    Absolute Immature Granulocyte NOT REPORTED 0.00 - 0.30 k/uL    WBC Morphology NOT REPORTED     RBC Morphology NOT REPORTED     Platelet Estimate NOT REPORTED    Basic Metabolic Panel w/ Reflex to MG   Result Value Ref Range    Glucose 101 (H) 70 - 99 mg/dL    BUN 6 6 - 20 mg/dL    CREATININE 8.29 (H) 0.50 - 0.90 mg/dL    Bun/Cre Ratio NOT REPORTED 9 - 20    Calcium 9.9 8.6 - 10.4 mg/dL    Sodium 562 130 - 865 mmol/L    Potassium 4.1 3.7 - 5.3 mmol/L    Chloride 99 98 - 107 mmol/L    CO2 24 20 - 31 mmol/L    Anion Gap 13 9 - 17 mmol/L    GFR Non-African American >60 >60 mL/min    GFR African American >60 >60 mL/min    GFR Comment          GFR Staging NOT REPORTED    Hepatic Function Panel   Result Value Ref Range    Albumin 4.6 3.5 - 5.2 g/dL    Alkaline Phosphatase 94 35 - 104 U/L    ALT 14 5 - 33 U/L    AST 17 <32 U/L    Total Bilirubin 0.15 (L) 0.3 - 1.2 mg/dL    Bilirubin, Direct <7.84 <0.31 mg/dL    Bilirubin, Indirect CANNOT BE CALCULATED 0.00 - 1.00 mg/dL    Total Protein 7.5 6.4 - 8.3 g/dL    Globulin NOT REPORTED 1.5 - 3.8 g/dL  Albumin/Globulin Ratio 1.6 1.0 - 2.5   Lipase   Result Value Ref Range    Lipase 29 13 - 60 U/L   Troponin   Result Value Ref Range    Troponin, High Sensitivity <6 0 - 14 ng/L    Troponin T NOT REPORTED <0.03 ng/mL    Troponin Interp NOT REPORTED    Brain Natriuretic Peptide   Result Value Ref Range    Pro-BNP 30 <300 pg/mL    BNP Interpretation Pro-BNP Reference Range:    D-Dimer, Quantitative   Result Value Ref Range    D-Dimer, Quant <0.19 mg/L FEU   HCG, Quantitative, Pregnancy   Result Value Ref Range    hCG Quant <1 <5 IU/L   Urinalysis Reflex to Culture    Specimen: Urine, clean catch   Result Value Ref Range    Color, UA Yellow Yellow    Turbidity UA SLIGHTLY CLOUDY (A) Clear    Glucose, Ur NEGATIVE NEGATIVE    Bilirubin Urine NEGATIVE NEGATIVE    Ketones, Urine NEGATIVE NEGATIVE    Specific Gravity, UA 1.026 1.005 - 1.030    Urine Hgb  NEGATIVE NEGATIVE    pH, UA 6.0 5.0 - 8.0    Protein, UA NEGATIVE NEGATIVE    Urobilinogen, Urine Normal Normal    Nitrite, Urine NEGATIVE NEGATIVE    Leukocyte Esterase, Urine NEGATIVE NEGATIVE    Urinalysis Comments NOT REPORTED    Microscopic Urinalysis   Result Value Ref Range    -          WBC, UA 5 TO 10 0 - 5 /HPF    RBC, UA 0 TO 2 0 - 2 /HPF    Casts UA NOT REPORTED /LPF    Crystals, UA NOT REPORTED None /HPF    Epithelial Cells UA 10 TO 20 0 - 5 /HPF    Renal Epithelial, UA NOT REPORTED 0 /HPF    Bacteria, UA MANY (A) None    Mucus, UA 2+ (A) None    Trichomonas, UA NOT REPORTED None    Amorphous, UA 2+ (A) None    Other Observations UA (A) NOT REQ.     Utilizing a urinalysis as the only screening method to exclude a potential uropathogen can be unreliable in many patient populations.  Rapid screening tests are less sensitive than culture and if UTI is a clinical possibility, culture should be considered despite a negative urinalysis.    Yeast, UA NOT REPORTED None       EMERGENCY DEPARTMENT COURSE:        The patient was given the following medications:  Orders Placed This Encounter   Medications   ??? 0.9 % sodium chloride bolus   ??? metoclopramide (REGLAN) injection 10 mg        Vitals:    Vitals:    12/31/19 2139   BP: 118/81   Pulse: 96   Resp: 16   Temp: 98.3 ??F (36.8 ??C)   TempSrc: Oral   SpO2: 99%   Weight: 70.8 kg (156 lb)   Height: 5\' 2"  (1.575 m)     -------------------------  BP: 118/81, Temp: 98.3 ??F (36.8 ??C), Pulse: 96, Resp: 16    CONSULTS:  None    CRITICAL CARE:   None    PROCEDURES:  None    DIAGNOSIS/ MDM:   Brenda Zamora is a 24 y.o. female who presents with headache, chest pain and abdominal pain.  Vital signs are stable.  Exam is unremarkable  except for mild lower abdominal tenderness to palpation.  She is neurovascularly intact.  The patient left AGAINST MEDICAL ADVICE before full assessment could be completed.  Before she left CBC, D-dimer, and urinalysis had resulted and were grossly  unremarkable.  There are still several labs pending which could reveal dangerous underlying disease processes. It is still unclear if she is pregnant. The patient has decided to leave against medical advice, because she states that she needs to go take care of her children.      She is clinically sober, free from distracting injury,appears to have intact insight and judgement, has normal mental status and in my opinion has adequate capacity to make medical decisions.  The patient refuses further evaluation, hospital admission or any further medical treatment and wants to be discharged.      The risks have been explained to the patient, including worsening illness, chronic pain, permanent disability, and death to herself and possibly a unborn fetus.  The benefits of admission have also been explained, including the availability and proximity of nurses, physicians, monitoring, diagnostic testing, and treatment.      The patient was able to understand and state the risks and benefits of hospital admission.  This was witnessed by nurse Alyse RN and myself. She had the opportunity to ask questions about her medical condition.  The patient was treated to the extent that she would allow, and knows that she may return for care at any time.        FINAL IMPRESSION      1. Lower abdominal pain    2. Acute nonintractable headache, unspecified headache type    3. Chest pain, unspecified type    4. Nausea          DISPOSITION/PLAN   DISPOSITION          PATIENT REFERRED TO:  Wilmer Floor, MD  [address]  [place] [address]  [phone]    Schedule an appointment as soon as possible for a visit in 1 day      Lanier Ensign, DO  [address]  [place] [address]  [phone]    Schedule an appointment as soon as possible for a visit in 1 day      [facility]  [phone] [facility] [address] [address]  [place] [address]  [phone]  Go to   If symptoms worsen      DISCHARGE MEDICATIONS:  New  Prescriptions    No medications on file       (Please note that portions of this note were completed with a voice recognitionprogram.  Efforts were made to edit the dictations but occasionally words are mis-transcribed.)    Marcy Salvo, DO DO  Emergency Physician Attending         Marcy Salvo, DO  12/31/19 2253

## 2019-12-31 NOTE — Discharge Instructions (Addendum)
You have presented with a complaint of abdominal pain.  We have recommended that you be evaluated with lab work, but you have chosen to leave against medical advice.     You have voiced your understanding of your condition and the possibility of death and are still choosing to leave without further testing.     You should return immediately to the Emergency Department for any chest pain, worsening abdominal pain, nausea / vomiting, any other care or concern.

## 2020-01-01 ENCOUNTER — Inpatient Hospital Stay
Admit: 2020-01-01 | Discharge: 2020-01-01 | Discharge status: Against Medical Advice | Payer: Medicaid Other | Attending: Emergency Medicine

## 2020-01-01 LAB — CBC WITH AUTO DIFFERENTIAL
Absolute Eos #: 0.1 10*3/uL (ref 0.0–0.4)
Absolute Lymph #: 2.7 10*3/uL (ref 1.0–4.8)
Absolute Mono #: 0.4 10*3/uL (ref 0.1–1.2)
Basophils Absolute: 0 10*3/uL (ref 0.0–0.2)
Basophils: 1 % (ref 0–2)
Eosinophils %: 2 % (ref 1–4)
Hematocrit: 33.4 % — ABNORMAL LOW (ref 36–46)
Hemoglobin: 10.7 g/dL — ABNORMAL LOW (ref 12.0–16.0)
Lymphocytes: 59 % — ABNORMAL HIGH (ref 24–44)
MCH: 25.6 pg — ABNORMAL LOW (ref 26–34)
MCHC: 32 g/dL (ref 31–37)
MCV: 79.9 fL — ABNORMAL LOW (ref 80–100)
MPV: 9.7 fL (ref 6.0–12.0)
Monocytes: 9 % (ref 2–11)
Platelets: 322 10*3/uL (ref 140–450)
RBC: 4.18 m/uL (ref 4.0–5.2)
RDW: 18 % — ABNORMAL HIGH (ref 12.5–15.4)
Seg Neutrophils: 29 % — ABNORMAL LOW (ref 36–66)
Segs Absolute: 1.4 10*3/uL — ABNORMAL LOW (ref 1.8–7.7)
WBC: 4.7 10*3/uL (ref 3.5–11.0)

## 2020-01-01 LAB — MICROSCOPIC URINALYSIS
Epithelial Cells UA: 10 /HPF (ref 0–5)
RBC, UA: 0 /HPF (ref 0–2)
WBC, UA: 5 /HPF (ref 0–5)

## 2020-01-01 LAB — HEPATIC FUNCTION PANEL
ALT: 14 U/L (ref 5–33)
AST: 17 U/L (ref <32)
Albumin/Globulin Ratio: 1.6 (ref 1.0–2.5)
Albumin: 4.6 g/dL (ref 3.5–5.2)
Alkaline Phosphatase: 94 U/L (ref 35–104)
Bilirubin, Direct: <0.08 mg/dL (ref <0.31)
Total Bilirubin: 0.15 mg/dL — ABNORMAL LOW (ref 0.3–1.2)
Total Protein: 7.5 g/dL (ref 6.4–8.3)

## 2020-01-01 LAB — EKG 12-LEAD
Atrial Rate: 77 {beats}/min
P Axis: 10 degrees
P-R Interval: 136 ms
Q-T Interval: 372 ms
QRS Duration: 76 ms
QTc Calculation (Bazett): 420 ms
R Axis: 55 degrees
T Axis: 56 degrees
Ventricular Rate: 77 {beats}/min

## 2020-01-01 LAB — URINALYSIS WITH REFLEX TO CULTURE
Bilirubin Urine: NEGATIVE
Glucose, Ur: NEGATIVE
Ketones, Urine: NEGATIVE
Leukocyte Esterase, Urine: NEGATIVE
Nitrite, Urine: NEGATIVE
Protein, UA: NEGATIVE
Specific Gravity, UA: 1.026 (ref 1.005–1.030)
Urine Hgb: NEGATIVE
Urobilinogen, Urine: NORMAL
pH, UA: 6 (ref 5.0–8.0)

## 2020-01-01 LAB — BASIC METABOLIC PANEL W/ REFLEX TO MG FOR LOW K
Anion Gap: 13 mmol/L (ref 9–17)
BUN: 6 mg/dL (ref 6–20)
CO2: 24 mmol/L (ref 20–31)
Calcium: 9.9 mg/dL (ref 8.6–10.4)
Chloride: 99 mmol/L (ref 98–107)
Creatinine: 0.92 mg/dL — ABNORMAL HIGH (ref 0.50–0.90)
GFR African American: >60 mL/min (ref >60)
GFR Non-African American: >60 mL/min (ref >60)
Glucose: 101 mg/dL — ABNORMAL HIGH (ref 70–99)
Potassium: 4.1 mmol/L (ref 3.7–5.3)
Sodium: 136 mmol/L (ref 135–144)

## 2020-01-01 LAB — TROPONIN: Troponin, High Sensitivity: <6 ng/L (ref 0–14)

## 2020-01-01 LAB — BRAIN NATRIURETIC PEPTIDE: Pro-BNP: 30 pg/mL (ref <300)

## 2020-01-01 LAB — D-DIMER, QUANTITATIVE: D-Dimer, Quant: <0.19 mg/L FEU

## 2020-01-01 LAB — LIPASE: Lipase: 29 U/L (ref 13–60)

## 2020-01-01 LAB — HCG, QUANTITATIVE, PREGNANCY: hCG Quant: <1 IU/L (ref <5)

## 2020-01-01 MED ORDER — METOCLOPRAMIDE HCL 5 MG/ML IJ SOLN
5 MG/ML | Freq: Once | INTRAMUSCULAR | Status: AC
Start: 2020-01-01 — End: 2019-12-31
  Administered 2020-01-01: 02:00:00 10 mg via INTRAVENOUS

## 2020-01-01 MED ORDER — SODIUM CHLORIDE 0.9 % IV BOLUS
0.9 | Freq: Once | INTRAVENOUS | Status: AC
Start: 2020-01-01 — End: 2019-12-31
  Administered 2020-01-01: 02:00:00 1000 mL via INTRAVENOUS

## 2020-01-01 MED FILL — METOCLOPRAMIDE HCL 5 MG/ML IJ SOLN: 5 mg/mL | INTRAMUSCULAR | Qty: 2

## 2020-01-15 ENCOUNTER — Ambulatory Visit: Payer: Medicaid Other | Admitting: Cardiology

## 2020-01-16 ENCOUNTER — Encounter: Payer: Self-pay | Admitting: Cardiology

## 2020-02-01 ENCOUNTER — Telehealth: Payer: Self-pay | Admitting: Cardiology

## 2020-02-01 NOTE — Telephone Encounter (Signed)
No show letter returned to sender    

## 2020-02-16 ENCOUNTER — Inpatient Hospital Stay
Admit: 2020-02-16 | Discharge: 2020-02-16 | Disposition: A | Discharge status: Home / Self Care | Payer: MEDICAID | Attending: Emergency Medicine

## 2020-02-16 ENCOUNTER — Emergency Department: Admit: 2020-02-16 | Payer: MEDICAID | Primary: Family Medicine

## 2020-02-16 DIAGNOSIS — J112 Influenza due to unidentified influenza virus with gastrointestinal manifestations: Secondary | ICD-10-CM

## 2020-02-16 LAB — COVID-19, RAPID: SARS-CoV-2, Rapid: NOT DETECTED

## 2020-02-16 MED ORDER — ONDANSETRON HCL 4 MG/5ML PO SOLN
4 MG/5ML | Freq: Once | ORAL | 0 refills | Status: AC
Start: 2020-02-16 — End: 2020-02-16

## 2020-02-16 MED ORDER — ONDANSETRON 4 MG PO TBDP
4 MG | Freq: Once | ORAL | Status: AC
Start: 2020-02-16 — End: 2020-02-16
  Administered 2020-02-16: 21:00:00 4 mg via ORAL

## 2020-02-16 MED ORDER — ONDANSETRON HCL 4 MG/2ML IJ SOLN
4 MG/2ML | Freq: Once | INTRAMUSCULAR | Status: AC
Start: 2020-02-16 — End: 2020-02-16
  Administered 2020-02-16: 22:00:00 4 mg via INTRAVENOUS

## 2020-02-16 MED FILL — ONDANSETRON 4 MG PO TBDP: 4 mg | ORAL | Qty: 1

## 2020-02-16 NOTE — ED Provider Notes (Signed)
[facility]  Emergency Department Encounter  EmergencyMedicine Resident     Pt Name:Brenda Zamora  MRN: [id number]  Birthdate 01/28/1996  Date of evaluation: 02/16/20  PCP:  Wilmer FloorAnthony D Atkins, MD    CHIEF COMPLAINT       Chief Complaint   Patient presents with   ??? Chest Pain   ??? Shortness of Breath   ??? Concern For COVID-19       HISTORY OF PRESENT ILLNESS  (Location/Symptom, Timing/Onset, Context/Setting, Quality, Duration, Modifying Factors, Severity.)      Brenda HazardKori Lobb is a 24 y.o. female who presents with shortness of breath and cough for the past 2 days.  Patient past medical history of hypertension, asthma presented chief complaint of shortness of breath, cough with clear phlegm, chest pains on coughing for the past 2 days.  Patient states the symptoms associated with chills but has not documented any fevers.  Patient states that she also has nausea vomiting and abdominal cramping but denies any diarrhea.  Patient also mentions that she has headache.  Patient states that she uses asthma inhalers with no relief.  Patient also has tried ibuprofen, Zofran with no relief.    Patient states that she has been vaccinated for Covid x2 with [employer] [employer], last dose 1 of months ago.  Patient states that she also has been vaccinated against influenza in October this year.  Patient states that she had tested positive for Covid in October 2020.  LMP 12/.02/21      Patient oxygen saturation 100% on room air while sitting, 98% after 1 minute of march test.      PAST MEDICAL / SURGICAL / SOCIAL / FAMILY HISTORY      has a past medical history of Anemia, Asthma, Bipolar 1 disorder (HCC), Borderline personality disorder (HCC), Depression, Gestational HTN, History of blood transfusion, Hx PTSD, Insomnia, PTSD (post-traumatic stress disorder), Schizophrenia (HCC), Sciatica, Seizure in childhood (HCC), and Vertigo.       has a past surgical history that includes Bunionectomy and Wrist surgery.      Social History      Socioeconomic History   ??? Marital status: Single     Spouse name: Not on file   ??? Number of children: Not on file   ??? Years of education: Not on file   ??? Highest education level: Not on file   Occupational History   ??? Not on file   Tobacco Use   ??? Smoking status: Former Smoker   ??? Smokeless tobacco: Never Used   ??? Tobacco comment: pt quit when she learned she was preg    Vaping Use   ??? Vaping Use: Never used   Substance and Sexual Activity   ??? Alcohol use: Not Currently   ??? Drug use: Yes     Frequency: 7.0 times per week     Types: Marijuana Sheran Fava(Weed)     Comment: pt last used approx 11/2017    ??? Sexual activity: Yes     Partners: Male   Other Topics Concern   ??? Not on file   Social History Narrative   ??? Not on file     Social Determinants of Health     Financial Resource Strain:    ??? Difficulty of Paying Living Expenses: Not on file   Food Insecurity:    ??? Worried About Running Out of Food in the Last Year: Not on file   ??? Ran Out of Food in the Last Year: Not  on file   Transportation Needs:    ??? Lack of Transportation (Medical): Not on file   ??? Lack of Transportation (Non-Medical): Not on file   Physical Activity:    ??? Days of Exercise per Week: Not on file   ??? Minutes of Exercise per Session: Not on file   Stress:    ??? Feeling of Stress : Not on file   Social Connections:    ??? Frequency of Communication with Friends and Family: Not on file   ??? Frequency of Social Gatherings with Friends and Family: Not on file   ??? Attends Religious Services: Not on file   ??? Active Member of Clubs or Organizations: Not on file   ??? Attends Banker Meetings: Not on file   ??? Marital Status: Not on file   Intimate Partner Violence:    ??? Fear of Current or Ex-Partner: Not on file   ??? Emotionally Abused: Not on file   ??? Physically Abused: Not on file   ??? Sexually Abused: Not on file   Housing Stability:    ??? Unable to Pay for Housing in the Last Year: Not on file   ??? Number of Places Lived in the Last Year: Not on file   ???  Unstable Housing in the Last Year: Not on file       Family History   Problem Relation Age of Onset   ??? Diabetes Mother    ??? Hypertension Mother    ??? Diabetes Father    ??? Hypertension Father    ??? Crohn's Disease Father    ??? Other Father    ??? Other Brother         Pulmonary Hypertension in one brother   ??? Diabetes Maternal Grandmother    ??? Other Maternal Grandmother    ??? Diabetes Paternal Grandmother    ??? Lupus Sister         2 sisters with lupus   1 with HTN and diabetes       Allergies:  Rocephin [ceftriaxone], Penicillins, Lidocaine viscous hcl, and Zithromax [azithromycin]    Home Medications:  Prior to Admission medications    Medication Sig Start Date End Date Taking? Authorizing Provider   ondansetron [facility]) 4 MG/5ML solution Take 5 mLs by mouth once for 1 dose 02/16/20 02/16/20 Yes Ardelia Mems, MD   loratadine (CLARITIN) 10 MG tablet Take 10 mg by mouth daily    Historical Provider, MD   fluticasone (VERAMYST) 27.5 MCG/SPRAY nasal spray 2 sprays by Each Nostril route daily    Historical Provider, MD   metoprolol succinate (TOPROL XL) 50 MG extended release tablet Take 50 mg by mouth daily    Historical Provider, MD   ibuprofen (ADVIL;MOTRIN) 800 MG tablet Take 1 tablet by mouth 2 times daily as needed for Pain 11/19/19 12/03/19  Randie Heinz, DO   acetaminophen (TYLENOL) 500 MG tablet Take 2 tablets by mouth 3 times daily for 14 days 11/19/19 12/03/19  Randie Heinz, DO   ferrous sulfate (FE TABS) 325 (65 Fe) MG EC tablet Take 1 tablet by mouth 2 times daily 07/23/18   Waylan Rocher, DO   docusate sodium (COLACE, DULCOLAX) 100 MG CAPS Take 100 mg by mouth 2 times daily 07/22/18   Christel Mormon Kooten, DO   aspirin EC 81 MG EC tablet Take 1 tablet by mouth daily 02/08/18   Alver Fisher, DO   docusate sodium (COLACE) 100 MG capsule Take  100 mg by mouth 2 times daily as needed for Constipation    Historical Provider, MD       REVIEW OF SYSTEMS    (2-9 systems for level 4, 10 or more for level 5)      Review  of Systems   Constitutional: Positive for chills. Negative for activity change and appetite change.   HENT: Positive for congestion. Negative for ear pain.    Respiratory: Positive for cough and shortness of breath. Negative for apnea, chest tightness and wheezing.    Cardiovascular: Positive for chest pain. Negative for palpitations and leg swelling.   Gastrointestinal: Positive for abdominal pain, constipation and nausea. Negative for abdominal distention and diarrhea.   Genitourinary: Negative for difficulty urinating.   Neurological: Positive for headaches. Negative for dizziness and light-headedness.   Psychiatric/Behavioral: Negative for agitation, behavioral problems and confusion.     PHYSICAL EXAM   (up to 7 for level 4, 8 or more for level 5)      INITIAL VITALS:   BP 108/62    Pulse 53    Temp 98.4 ??F (36.9 ??C) (Oral)    Resp 16    LMP 02/13/2020      Vitals:    02/16/20 1305   BP: 108/62   Pulse: 53   Resp: 16   Temp: 98.4 ??F (36.9 ??C)   TempSrc: Oral        Physical Exam  Constitutional:       Appearance: She is well-developed and normal weight.   HENT:      Head: Normocephalic and atraumatic.   Cardiovascular:      Rate and Rhythm: Regular rhythm. Tachycardia present.   Pulmonary:      Effort: Pulmonary effort is normal. No tachypnea.      Breath sounds: Normal breath sounds.   Chest:      Chest wall: No tenderness.   Abdominal:      General: Bowel sounds are normal.      Palpations: Abdomen is soft.      Tenderness: There is no abdominal tenderness.   Musculoskeletal:      Cervical back: Normal range of motion.      Right lower leg: No tenderness.      Left lower leg: No tenderness.   Neurological:      General: No focal deficit present.      Mental Status: She is alert and oriented to person, place, and time.       DIFFERENTIAL  DIAGNOSIS     PLAN (LABS / IMAGING / EKG):  Orders Placed This Encounter   Procedures   ??? COVID-19, Rapid   ??? XR CHEST PORTABLE       MEDICATIONS ORDERED:  Orders Placed This  Encounter   Medications   ??? ondansetron (ZOFRAN-ODT) disintegrating tablet 4 mg   ??? ondansetron (ZOFRAN) injection 4 mg   ??? ondansetron (ZOFRAN) 4 MG/5ML solution     Sig: Take 5 mLs by mouth once for 1 dose     Dispense:  50 each     Refill:  0       DIAGNOSTIC RESULTS / EMERGENCY DEPARTMENT COURSE / MDM   LAB RESULTS:  Results for orders placed or performed during the hospital encounter of 02/16/20   COVID-19, Rapid    Specimen: Nasopharyngeal Swab   Result Value Ref Range    Specimen Description .NASOPHARYNGEAL SWAB     SARS-CoV-2, Rapid Not Detected Not Detected  RADIOLOGY:  XR CHEST PORTABLE   Final Result   No acute process.              EKG  Normal sinus rhythm.    All EKG's are interpreted by the Emergency Department Physician who either signs or Co-signs this chart in the absence of a cardiologist.      INITIAL IMPRESSION:    24 year old female past medical history of hypertension, asthma, Covid in October 2020 presented with chief complaints of shortness of breath, cough, phlegm, chest pain, nausea vomiting and abdominal cramping.  Rule out suspected COVID-19 infection.    EMERGENCY DEPARTMENT COURSE & MDM:    We will order rapid Covid this patient meets criteria for Regeneron therapy.  We will order chest x-ray    Rapid COVID test - Negative  CXR - no acute process    Persistent nausea after Zofran ODT.  Zofran 4 mg IV given.  Nausea persists.  Plan to discharge the patient considering patient has likely gastric flu on Zofran suspension.         PROCEDURES:  none    CONSULTS:  None    CRITICAL CARE:  Please see attending note    FINAL IMPRESSION      1. Flu, gastric          DISPOSITION / PLAN     DISPOSITION Decision To Discharge 02/16/2020 06:13:24 PM      PATIENT REFERRED TO:  [facility] ED  [address]  [place] [address]  [phone]    If symptoms worsen      DISCHARGE MEDICATIONS:  New Prescriptions    ONDANSETRON (ZOFRAN) 4 MG/5ML SOLUTION    Take 5 mLs by mouth once  for 1 dose       Ardelia Mems, MD  Emergency Medicine Resident    (Please note that portions of thisnote were completed with a voice recognition program.  Efforts were made to edit the dictations but occasionally words are mis-transcribed.)       Ardelia Mems, MD  Resident  02/16/20 [phone]

## 2020-02-16 NOTE — ED Notes (Signed)
Pt came into er in nad   Pt reports cp and sob x2 days   Pt reports cough and pain when breathing in and out   Pt has a concern for covid due to these symptoms   Pt is covid vaccinated   Pt resting in bed in nad   Pt even and unlabored rr   Pt speaking in full sentences   Will continue to assess      Pauline Aus, RN  02/16/20 [phone]

## 2020-02-16 NOTE — Discharge Instructions (Signed)
Patient admitted for complaints of SOB, cough and phlegm, nausea and vomiting. COVID negative and CXR clean. Persistent nausea after Zofran oral and IV. Likely gastric flu. Discharging on Zofran suspension.

## 2020-02-16 NOTE — ED Triage Notes (Signed)
Pt reports having cold and flu s/s for 2 days and yesterday developed chest pain.

## 2020-02-16 NOTE — ED Provider Notes (Signed)
[facility]     Emergency Department     Faculty Attestation    I performed a history and physical examination of the patient and discussed management with the resident. I reviewed the resident???s note and agree with the documented findings and plan of care. Any areas of disagreement are noted on the chart. I was personally present for the key portions of any procedures. I have documented in the chart those procedures where I was not present during the key portions. I have reviewed the emergency nurses triage note. I agree with the chief complaint, past medical history, past surgical history, allergies, medications, social and family history as documented unless otherwise noted below.        For Physician Assistant/ Nurse Practitioner cases/documentation I have personally evaluated this patient and have completed at least one if not all key elements of the E/M (history, physical exam, and MDM). Additional findings are as noted.  I have personally seen and evaluated the patient.  I find the patient's history and physical exam are consistent with the NP/PA documentation.  I agree with the care provided, treatment rendered, disposition and follow-up plan.    24 year old female with a history of asthma presenting with 2 days of fatigue, rigors, cold sweats, and cough.  Clear sputum production.  Today started having vomiting.  No diarrhea.  Was able to work, so came into the ER for evaluation.  Vaccinated against Covid.    Exam:  General: Laying on the bed, awake, alert and in no acute distress  CV: normal rate and regular rhythm  Lungs: Breathing comfortably on room air with no tachypnea, hypoxia, or increased work of breathing  Abdomen: soft, non-tender, non-distended    Plan:  Viral symptoms, possible Covid based on symptoms.  Patient is high risk due to history of asthma and BMI greater than 25.  Will run rapid test, as patient is a candidate for monoclonal antibody therapy if positive.  We  will also give Zofran for nausea and vomiting, obtain chest x-ray, and reassess.    Covid is negative.  Patient still nauseous after ODT Zofran, requesting liquid Zofran to be discharged on, as she believes that she tolerates this better.  IV dose of Zofran given with some improvement.  No emesis.    Discharge home with Zofran solution prescription.  Return with any worsening or new symptoms.  Clinical impression is viral syndrome, possible gastroenteritis.        Conni Slipper, MD   Attending Emergency  Physician    (Please note that portions of this note were completed with a voice recognition program. Efforts were made to edit the dictations but occasionally words are mis-transcribed.)               Conni Slipper, MD  02/16/20 [id number]

## 2020-02-17 LAB — EKG 12-LEAD
Atrial Rate: 77 {beats}/min
P Axis: 21 degrees
P-R Interval: 142 ms
Q-T Interval: 366 ms
QRS Duration: 80 ms
QTc Calculation (Bazett): 414 ms
R Axis: 62 degrees
T Axis: 32 degrees
Ventricular Rate: 77 {beats}/min

## 2020-02-18 ENCOUNTER — Inpatient Hospital Stay
Admit: 2020-02-18 | Discharge: 2020-02-19 | Disposition: A | Discharge status: Home / Self Care | Payer: Medicaid Other | Attending: Emergency Medicine

## 2020-02-18 DIAGNOSIS — F1290 Cannabis use, unspecified, uncomplicated: Secondary | ICD-10-CM

## 2020-02-18 LAB — BASIC METABOLIC PANEL W/ REFLEX TO MG FOR LOW K
Anion Gap: 10 mmol/L (ref 9–17)
BUN: 7 mg/dL (ref 6–20)
CO2: 23 mmol/L (ref 20–31)
Calcium: 9.2 mg/dL (ref 8.6–10.4)
Chloride: 106 mmol/L (ref 98–107)
Creatinine: 0.76 mg/dL (ref 0.50–0.90)
GFR African American: >60 mL/min (ref >60)
GFR Non-African American: >60 mL/min (ref >60)
Glucose: 86 mg/dL (ref 70–99)
Potassium: 4.3 mmol/L (ref 3.7–5.3)
Sodium: 139 mmol/L (ref 135–144)

## 2020-02-18 LAB — CBC WITH AUTO DIFFERENTIAL
Absolute Eos #: 0.1 10*3/uL (ref 0.0–0.4)
Absolute Lymph #: 2.9 10*3/uL (ref 1.0–4.8)
Absolute Mono #: 0.3 10*3/uL (ref 0.1–1.2)
Basophils Absolute: 0 10*3/uL (ref 0.0–0.2)
Basophils: 1 % (ref 0–2)
Eosinophils %: 2 % (ref 1–4)
Hematocrit: 33.1 % — ABNORMAL LOW (ref 36–46)
Hemoglobin: 10.5 g/dL — ABNORMAL LOW (ref 12.0–16.0)
Lymphocytes: 55 % — ABNORMAL HIGH (ref 24–44)
MCH: 24.4 pg — ABNORMAL LOW (ref 26–34)
MCHC: 31.8 g/dL (ref 31–37)
MCV: 76.6 fL — ABNORMAL LOW (ref 80–100)
MPV: 9.7 fL (ref 6.0–12.0)
Monocytes: 7 % (ref 2–11)
Platelets: 294 10*3/uL (ref 140–450)
RBC: 4.32 m/uL (ref 4.0–5.2)
RDW: 19 % — ABNORMAL HIGH (ref 12.5–15.4)
Seg Neutrophils: 35 % — ABNORMAL LOW (ref 36–66)
Segs Absolute: 1.8 10*3/uL (ref 1.8–7.7)
WBC: 5.2 10*3/uL (ref 3.5–11.0)

## 2020-02-18 LAB — URINE DRUG SCREEN
Amphetamine Screen, Ur: NEGATIVE
Barbiturate Screen, Ur: NEGATIVE
Benzodiazepine Screen, Urine: NEGATIVE
Cannabinoid Scrn, Ur: POSITIVE — AB
Cocaine Metabolite, Urine: NEGATIVE
Methadone Screen, Urine: NEGATIVE
Opiates, Urine: NEGATIVE
Oxycodone Screen, Ur: NEGATIVE
Phencyclidine, Urine: NEGATIVE

## 2020-02-18 LAB — URINALYSIS WITH REFLEX TO CULTURE
Bilirubin Urine: NEGATIVE
Glucose, Ur: NEGATIVE
Ketones, Urine: NEGATIVE
Leukocyte Esterase, Urine: NEGATIVE
Nitrite, Urine: NEGATIVE
Protein, UA: NEGATIVE
Specific Gravity, UA: 1.025 (ref 1.005–1.030)
Urobilinogen, Urine: NORMAL
pH, UA: 5.5 (ref 5.0–8.0)

## 2020-02-18 LAB — HEPATIC FUNCTION PANEL
ALT: <5 U/L — ABNORMAL LOW (ref 5–33)
AST: 12 U/L (ref <32)
Albumin/Globulin Ratio: 1.4 (ref 1.0–2.5)
Albumin: 4.1 g/dL (ref 3.5–5.2)
Alkaline Phosphatase: 84 U/L (ref 35–104)
Bilirubin, Direct: <0.08 mg/dL (ref <0.31)
Total Bilirubin: 0.2 mg/dL — ABNORMAL LOW (ref 0.3–1.2)
Total Protein: 7.1 g/dL (ref 6.4–8.3)

## 2020-02-18 LAB — MICROSCOPIC URINALYSIS
Epithelial Cells UA: 2 /HPF (ref 0–5)
RBC, UA: 0 /HPF (ref 0–2)
WBC, UA: 5 /HPF (ref 0–5)

## 2020-02-18 LAB — LIPASE: Lipase: 30 U/L (ref 13–60)

## 2020-02-18 LAB — PREGNANCY, URINE: HCG(Urine) Pregnancy Test: NEGATIVE

## 2020-02-18 MED ORDER — FAMOTIDINE 20 MG PO TABS
20 MG | ORAL_TABLET | Freq: Two times a day (BID) | ORAL | 0 refills | Status: DC
Start: 2020-02-18 — End: 2020-04-27

## 2020-02-18 MED ORDER — FAMOTIDINE 20 MG/2ML IV SOLN
20 MG/2ML | Freq: Once | INTRAVENOUS | Status: AC
Start: 2020-02-18 — End: 2020-02-18
  Administered 2020-02-18: 23:00:00 20 mg via INTRAVENOUS

## 2020-02-18 MED ORDER — SODIUM CHLORIDE 0.9 % IV BOLUS
0.9 % | Freq: Once | INTRAVENOUS | Status: AC
Start: 2020-02-18 — End: 2020-02-18
  Administered 2020-02-18: 23:00:00 1000 mL via INTRAVENOUS

## 2020-02-18 MED ORDER — ONDANSETRON HCL 4 MG/2ML IJ SOLN
42 MG/2ML | Freq: Once | INTRAMUSCULAR | Status: AC
Start: 2020-02-18 — End: 2020-02-18
  Administered 2020-02-18: 23:00:00 4 mg via INTRAVENOUS

## 2020-02-18 MED FILL — ONDANSETRON HCL 4 MG/2ML IJ SOLN: 4 MG/2ML | INTRAMUSCULAR | Qty: 2

## 2020-02-18 MED FILL — FAMOTIDINE 20 MG/2ML IV SOLN: 20 MG/2ML | INTRAVENOUS | Qty: 2

## 2020-02-18 NOTE — ED Notes (Signed)
Discharge instructions given and all questions answered. IV DC'd and pt informed to quit smoking pot     Lawernce Ion, RN  02/18/20 [phone]

## 2020-02-18 NOTE — Discharge Instructions (Signed)
Stop smoking cannabis.  Return for worsening symptoms.

## 2020-02-18 NOTE — ED Triage Notes (Signed)
To room 6 per ambulatory from lobby. Pt at [facility] on 12/3 for nausea and vomiting. Treated with zofran and sent home with Zofran po. Unable to keep anything down since 12/4
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
## 2020-03-11 ENCOUNTER — Inpatient Hospital Stay
Admit: 2020-03-11 | Discharge: 2020-03-11 | Disposition: A | Discharge status: Home / Self Care | Payer: Medicaid Other | Attending: Emergency Medicine

## 2020-03-11 DIAGNOSIS — M5441 Lumbago with sciatica, right side: Secondary | ICD-10-CM

## 2020-03-11 LAB — MICROSCOPIC URINALYSIS
Epithelial Cells UA: 5 /HPF
RBC, UA: 0 /HPF
WBC, UA: 2 /HPF

## 2020-03-11 LAB — URINALYSIS WITH REFLEX TO CULTURE
Bilirubin Urine: NEGATIVE
Glucose, Ur: NEGATIVE
Ketones, Urine: NEGATIVE
Leukocyte Esterase, Urine: NEGATIVE
Nitrite, Urine: NEGATIVE
Protein, UA: NEGATIVE
Specific Gravity, UA: 1.018 (ref 1.000–1.030)
Urine Hgb: NEGATIVE
Urobilinogen, Urine: NORMAL
pH, UA: 6 (ref 5.0–8.0)

## 2020-03-11 LAB — PREGNANCY, URINE: HCG(Urine) Pregnancy Test: NEGATIVE

## 2020-03-11 MED ORDER — DICLOFENAC SODIUM 1 % EX GEL
1 % | Freq: Four times a day (QID) | CUTANEOUS | 0 refills | Status: AC
Start: 2020-03-11 — End: ?

## 2020-03-11 MED ORDER — DICLOFENAC SODIUM 1 % EX GEL
1 % | Freq: Once | CUTANEOUS | Status: AC
Start: 2020-03-11 — End: 2020-03-11
  Administered 2020-03-11: 15:00:00 4 g via TOPICAL

## 2020-03-11 MED ORDER — ORPHENADRINE CITRATE 30 MG/ML IJ SOLN
30 MG/ML | Freq: Once | INTRAMUSCULAR | Status: AC
Start: 2020-03-11 — End: 2020-03-11
  Administered 2020-03-11: 14:00:00 60 mg via INTRAMUSCULAR

## 2020-03-11 MED ORDER — NAPROXEN 250 MG PO TABS
250 MG | Freq: Once | ORAL | Status: AC
Start: 2020-03-11 — End: 2020-03-11
  Administered 2020-03-11: 15:00:00 500 mg via ORAL

## 2020-03-11 MED ORDER — NAPROXEN 500 MG PO TABS
500 MG | ORAL_TABLET | Freq: Two times a day (BID) | ORAL | 1 refills | Status: DC
Start: 2020-03-11 — End: 2020-04-27

## 2020-03-11 MED FILL — ORPHENADRINE CITRATE 30 MG/ML IJ SOLN: 30 mg/mL | INTRAMUSCULAR | Qty: 2

## 2020-03-11 MED FILL — DICLOFENAC SODIUM 1 % EX GEL: 1 % | CUTANEOUS | Qty: 100

## 2020-03-11 MED FILL — NAPROXEN 250 MG PO TABS: 250 mg | ORAL | Qty: 2

## 2020-03-11 NOTE — ED Provider Notes (Signed)
EMERGENCY DEPARTMENT ENCOUNTER   ATTENDING ATTESTATION     Pt Name: Brenda Zamora  MRN: [id number]  Birthdate [birth year]  Date of evaluation: 03/11/20       Brenda Zamora is a 24 y.o. female who presents with Back Pain and Leg Pain (Rt upper leg pain)      MDM:   Right low back pain and buttock pain radiating to right leg  Suspect sciatica  Antiinflammatory meds  Fu back specialist    Vitals:   Vitals:    03/11/20 0903   BP: 120/67   Pulse: 88   Resp: 16   Temp: 98.3 ??F (36.8 ??C)   TempSrc: Oral   SpO2: 99%   Weight: 132 lb (59.9 kg)   Height: 5\' 2"  (1.575 m)         I personally evaluated and examined the patient in conjunction with the resident and agree with the assessment, treatment plan, and disposition of the patient as recorded by the resident.    I performed a history and physical examination of the patient and discussed management with the resident. I reviewed the resident???s note and agree with the documented findings and plan of care. Any areas of disagreement are noted on the chart. I was personally present for the key portions of any procedures. I have documented in the chart those procedures where I was not present during the key portions. I have personally reviewed all images and agree with the resident's interpretation. I have reviewed the emergency nurses triage note. I agree with the chief complaint, past medical history, past surgical history, allergies, medications, social and family history as documented unless otherwise noted.    The care is provided during an unprecedented national emergency due to the novel coronavirus, COVID 19.  , MD  Attending Emergency Physician           Tamsen Snider, MD  03/11/20 [phone]

## 2020-03-11 NOTE — ED Provider Notes (Signed)
[facility] ED  Emergency Department Encounter  EmergencyMedicine Resident     Pt Name:Brenda Zamora  MRN: [id number]  Birthdate 11-12-1995  Date of evaluation: 03/11/20  PCP:  Wilmer Floor, MD    This patient was evaluated in the Emergency Department for symptoms described in the history of present illness. The patient was evaluated in the context of the global COVID-19 pandemic, which necessitated consideration that the patient might be at risk for infection with the SARS-CoV-2 virus that causes COVID-19. Institutional protocols and algorithms that pertain to the evaluation of patients at risk for COVID-19 are in a state of rapid change based on information released by regulatory bodies including the CDC and federal and state organizations. These policies and algorithms were followed during the patient's care in the ED.    CHIEF COMPLAINT       Chief Complaint   Patient presents with   ??? Back Pain   ??? Leg Pain     Rt upper leg pain       HISTORY OF PRESENT ILLNESS  (Location/Symptom, Timing/Onset, Context/Setting, Quality, Duration, Modifying Factors, Severity.)      Brenda Zamora is a 24 y.o. female who presents with plaint of posterior thigh pain and sciatica.  Patient states is been ongoing for last several days.  Denies any injury fevers no chills no abdominal pain no diarrhea no myalgias ambulatory with some difficulty due to symptoms, took apap yesterday without improvement      PAST MEDICAL / SURGICAL / SOCIAL / FAMILY HISTORY      has a past medical history of Anemia, Asthma, Bipolar 1 disorder (HCC), Borderline personality disorder (HCC), Depression, Gestational HTN, History of blood transfusion, Hx PTSD, Insomnia, PTSD (post-traumatic stress disorder), Schizophrenia (HCC), Sciatica, Seizure in childhood (HCC), and Vertigo.     has a past surgical history that includes Bunionectomy and Wrist surgery.      Social History     Socioeconomic History   ??? Marital status: Single     Spouse name: Not on file    ??? Number of children: Not on file   ??? Years of education: Not on file   ??? Highest education level: Not on file   Occupational History   ??? Not on file   Tobacco Use   ??? Smoking status: Former Smoker   ??? Smokeless tobacco: Never Used   ??? Tobacco comment: pt quit when she learned she was preg    Vaping Use   ??? Vaping Use: Never used   Substance and Sexual Activity   ??? Alcohol use: Not Currently   ??? Drug use: Yes     Frequency: 7.0 times per week     Types: Marijuana Sheran Fava)     Comment: daily per pt.   ??? Sexual activity: Yes     Partners: Male   Other Topics Concern   ??? Not on file   Social History Narrative   ??? Not on file     Social Determinants of Health     Financial Resource Strain:    ??? Difficulty of Paying Living Expenses: Not on file   Food Insecurity:    ??? Worried About Running Out of Food in the Last Year: Not on file   ??? Ran Out of Food in the Last Year: Not on file   Transportation Needs:    ??? Lack of Transportation (Medical): Not on file   ??? Lack of Transportation (Non-Medical): Not on file   Physical Activity:    ???  Days of Exercise per Week: Not on file   ??? Minutes of Exercise per Session: Not on file   Stress:    ??? Feeling of Stress : Not on file   Social Connections:    ??? Frequency of Communication with Friends and Family: Not on file   ??? Frequency of Social Gatherings with Friends and Family: Not on file   ??? Attends Religious Services: Not on file   ??? Active Member of Clubs or Organizations: Not on file   ??? Attends Banker Meetings: Not on file   ??? Marital Status: Not on file   Intimate Partner Violence:    ??? Fear of Current or Ex-Partner: Not on file   ??? Emotionally Abused: Not on file   ??? Physically Abused: Not on file   ??? Sexually Abused: Not on file   Housing Stability:    ??? Unable to Pay for Housing in the Last Year: Not on file   ??? Number of Places Lived in the Last Year: Not on file   ??? Unstable Housing in the Last Year: Not on file       Family History   Problem Relation Age of  Onset   ??? Diabetes Mother    ??? Hypertension Mother    ??? Diabetes Father    ??? Hypertension Father    ??? Crohn's Disease Father    ??? Other Father    ??? Other Brother         Pulmonary Hypertension in one brother   ??? Diabetes Maternal Grandmother    ??? Other Maternal Grandmother    ??? Diabetes Paternal Grandmother    ??? Lupus Sister         2 sisters with lupus   1 with HTN and diabetes       Allergies:  Rocephin [ceftriaxone], Penicillins, Lidocaine viscous hcl, and Zithromax [azithromycin]    Home Medications:  Prior to Admission medications    Medication Sig Start Date End Date Taking? Authorizing Provider   ondansetron [facility]) 4 MG/5ML solution Take 4 mg by mouth once Took 3 times since 12/4 and continues to vomit    Historical Provider, MD   famotidine (PEPCID) 20 MG tablet Take 1 tablet by mouth 2 times daily 02/18/20   Wess Botts, MD   loratadine (CLARITIN) 10 MG tablet Take 10 mg by mouth daily    Historical Provider, MD   fluticasone (VERAMYST) 27.5 MCG/SPRAY nasal spray 2 sprays by Each Nostril route daily    Historical Provider, MD   metoprolol succinate (TOPROL XL) 50 MG extended release tablet Take 50 mg by mouth daily    Historical Provider, MD   ibuprofen (ADVIL;MOTRIN) 800 MG tablet Take 1 tablet by mouth 2 times daily as needed for Pain 11/19/19 12/03/19  Randie Heinz, DO   acetaminophen (TYLENOL) 500 MG tablet Take 2 tablets by mouth 3 times daily for 14 days 11/19/19 12/03/19  Randie Heinz, DO   ferrous sulfate (FE TABS) 325 (65 Fe) MG EC tablet Take 1 tablet by mouth 2 times daily 07/23/18   Waylan Rocher, DO   docusate sodium (COLACE, DULCOLAX) 100 MG CAPS Take 100 mg by mouth 2 times daily 07/22/18   Christel Mormon Kooten, DO   aspirin EC 81 MG EC tablet Take 1 tablet by mouth daily 02/08/18   Alver Fisher, DO       REVIEW OF SYSTEMS    (2-9 systems for level  4, 10 or more for level 5)      Review of Systems   Constitutional: Negative for activity change, appetite change, chills, fatigue and  fever.   HENT: Negative for congestion.    Eyes: Negative for photophobia.   Respiratory: Negative for shortness of breath.    Gastrointestinal: Negative for abdominal pain.   Genitourinary: Negative for difficulty urinating and dysuria.   Musculoskeletal: Positive for arthralgias, back pain, gait problem and myalgias. Negative for neck pain and neck stiffness.   Skin: Negative for pallor.   Allergic/Immunologic: Negative for food allergies.   Neurological: Negative for tremors.   Hematological: Negative for adenopathy.   Psychiatric/Behavioral: Negative for agitation.       PHYSICAL EXAM   (up to 7 for level 4, 8 or more for level 5)      INITIAL VITALS:   BP 120/67    Pulse 88    Temp 98.3 ??F (36.8 ??C) (Oral)    Resp 16    Ht  (1.575 m)    Wt 132 lb (59.9 kg)    LMP 02/13/2020    SpO2 99%    BMI 24.14 kg/m??     Physical Exam  Vitals and nursing note reviewed.   Constitutional:       Appearance: Normal appearance.      Comments: Uncomfortable nontoxic   HENT:      Head: Normocephalic and atraumatic.      Right Ear: External ear normal.      Left Ear: External ear normal.      Nose: Nose normal.      Mouth/Throat:      Pharynx: Oropharynx is clear.   Eyes:      Conjunctiva/sclera: Conjunctivae normal.   Cardiovascular:      Rate and Rhythm: Normal rate.      Pulses: Normal pulses.   Pulmonary:      Effort: Pulmonary effort is normal.   Abdominal:      Palpations: Abdomen is soft.      Tenderness: There is no abdominal tenderness.   Musculoskeletal:         General: Tenderness present. No deformity. Normal range of motion.      Cervical back: Normal range of motion.   Skin:     General: Skin is warm.   Neurological:      Mental Status: She is alert and oriented to person, place, and time.      GCS: GCS eye subscore is 4. GCS verbal subscore is 5.      Sensory: Sensation is intact.      Deep Tendon Reflexes:      Reflex Scores:       Patellar reflexes are 2+ on the right side and 2+ on the left side.        Achilles reflexes are 2+ on the right side and 2+ on the left side.  Psychiatric:         Mood and Affect: Mood normal.         DIFFERENTIAL  DIAGNOSIS     PLAN (LABS / IMAGING / EKG):  Orders Placed This Encounter   Procedures   ??? Urinalysis Reflex to Culture   ??? HCG, Pregnancy,Urine   ??? Microscopic Urinalysis       MEDICATIONS ORDERED:  Orders Placed This Encounter   Medications   ??? diclofenac sodium (VOLTAREN) 1 % gel 4 g   ??? naproxen (NAPROSYN) tablet 500 mg   ??? orphenadrine (NORFLEX)  injection 60 mg   ??? diclofenac sodium (VOLTAREN) 1 % GEL     Sig: Apply 4 g topically 4 times daily     Dispense:  100 g     Refill:  0   ??? naproxen (NAPROSYN) 500 MG tablet     Sig: Take 1 tablet by mouth 2 times daily (with meals)     Dispense:  20 tablet     Refill:  1       DDX: sciatica    DIAGNOSTIC RESULTS / EMERGENCY DEPARTMENT COURSE / MDM   LAB RESULTS:  Results for orders placed or performed during the hospital encounter of 03/11/20   Urinalysis Reflex to Culture    Specimen: Urine, clean catch   Result Value Ref Range    Color, UA Yellow Yellow    Turbidity UA Cloudy (A) Clear    Glucose, Ur NEGATIVE NEGATIVE    Bilirubin Urine NEGATIVE NEGATIVE    Ketones, Urine NEGATIVE NEGATIVE    Specific Gravity, UA 1.018 1.000 - 1.030    Urine Hgb NEGATIVE NEGATIVE    pH, UA 6.0 5.0 - 8.0    Protein, UA NEGATIVE NEGATIVE    Urobilinogen, Urine Normal Normal    Nitrite, Urine NEGATIVE NEGATIVE    Leukocyte Esterase, Urine NEGATIVE NEGATIVE    Urinalysis Comments NOT REPORTED    HCG, Pregnancy,Urine   Result Value Ref Range    HCG(Urine) Pregnancy Test NEGATIVE NEGATIVE   Microscopic Urinalysis   Result Value Ref Range    -          WBC, UA 2 TO 5 /HPF    RBC, UA 0 TO 2 /HPF    Casts UA NOT REPORTED /LPF    Crystals, UA NOT REPORTED None /HPF    Epithelial Cells UA 5 TO 10 /HPF    Renal Epithelial, UA NOT REPORTED 0 /HPF    Bacteria, UA MODERATE (A) None    Mucus, UA 1+ (A) None    Trichomonas, UA NOT REPORTED None    Amorphous, UA  NOT REPORTED None    Other Observations UA NOT REPORTED NOT REQ.    Yeast, UA NOT REPORTED None       IMPRESSION: Alert oriented nontoxic uncomfortable appearing 24 year old female with posterior thigh pain low back pain consistent with sciatica traumatic plan will be urinalysis, supportive care reevaluate anticipate discharge    RADIOLOGY:  No results found.      EKG      All EKG's are interpreted by the Emergency Department Physician who either signs or Co-signs this chart in the absence of a cardiologist.    EMERGENCY DEPARTMENT COURSE:  Seen and evaluated history physical examination signs and symptoms are consistent with sciatica patient provided analgesia significant provement in her pain symptoms urinalysis unremarkable patient discharged home in stable condition with outpatient follow-up.  PROCEDURES:      CONSULTS:  None    CRITICAL CARE:      FINAL IMPRESSION      1. Sciatica of right side          DISPOSITION / PLAN     DISPOSITION  dc      PATIENT REFERRED TO:  Virgie Dad, MD  [address], [address]  [place] [address]  [phone]    Schedule an appointment as soon as possible for a visit in 1 day      [facility] ED  [address]  [place] [address]  [phone]  Go to   If symptoms worsen, As needed      DISCHARGE MEDICATIONS:  Discharge Medication List as of 03/11/2020 10:55 AM      START taking these medications    Details   diclofenac sodium (VOLTAREN) 1 % GEL Apply 4 g topically 4 times daily, Topical, 4 TIMES DAILY Starting Mon 03/11/2020, Disp-100 g, R-0, Print      naproxen (NAPROSYN) 500 MG tablet Take 1 tablet by mouth 2 times daily (with meals), Disp-20 tablet, R-1Print             Lurline IdolBruce J Laini Urick, DO  Emergency Medicine Resident    (Please note that portions of thisnote were completed with a voice recognition program.  Efforts were made to edit the dictations but occasionally words are mis-transcribed.)       Lurline IdolBruce J Jeren Dufrane, DO  Resident  03/11/20 1410

## 2020-03-17 ENCOUNTER — Inpatient Hospital Stay
Admit: 2020-03-17 | Discharge: 2020-03-17 | Disposition: A | Discharge status: Home / Self Care | Payer: MEDICAID | Attending: Emergency Medicine

## 2020-03-17 DIAGNOSIS — U071 COVID-19: Secondary | ICD-10-CM

## 2020-03-17 LAB — CBC WITH AUTO DIFFERENTIAL
Absolute Eos #: 0.04 10*3/uL (ref 0.0–0.4)
Absolute Lymph #: 0.48 10*3/uL — ABNORMAL LOW (ref 1.0–4.8)
Absolute Mono #: 0.4 10*3/uL (ref 0.1–1.3)
Basophils Absolute: 0.04 10*3/uL (ref 0.0–0.2)
Basophils: 1 % (ref 0–2)
Eosinophils %: 1 % (ref 0–4)
Hematocrit: 29.4 % — ABNORMAL LOW (ref 36–46)
Hemoglobin: 9.3 g/dL — ABNORMAL LOW (ref 12.0–16.0)
Lymphocytes: 12 % — ABNORMAL LOW (ref 24–44)
MCH: 23.1 pg — ABNORMAL LOW (ref 26–34)
MCHC: 31.6 g/dL (ref 31–37)
MCV: 73.2 fL — ABNORMAL LOW (ref 80–100)
MPV: 9 fL (ref 6.0–12.0)
Monocytes: 10 % — ABNORMAL HIGH (ref 1–7)
Platelets: 257 10*3/uL (ref 150–450)
RBC: 4.01 m/uL (ref 4.0–5.2)
RDW: 18.6 % — ABNORMAL HIGH (ref 11.5–14.9)
Seg Neutrophils: 76 % — ABNORMAL HIGH (ref 36–66)
Segs Absolute: 3.04 10*3/uL (ref 1.3–9.1)
WBC: 4 10*3/uL (ref 3.5–11.0)

## 2020-03-17 LAB — COMPREHENSIVE METABOLIC PANEL W/ REFLEX TO MG FOR LOW K
ALT: 9 U/L (ref 5–33)
AST: 15 U/L (ref <32)
Albumin: 4.1 g/dL (ref 3.5–5.2)
Alkaline Phosphatase: 78 U/L (ref 35–104)
Anion Gap: 13 mmol/L (ref 9–17)
BUN: 6 mg/dL (ref 6–20)
CO2: 20 mmol/L (ref 20–31)
Calcium: 9.6 mg/dL (ref 8.6–10.4)
Chloride: 104 mmol/L (ref 98–107)
Creatinine: 0.92 mg/dL — ABNORMAL HIGH (ref 0.50–0.90)
GFR African American: >60 mL/min (ref >60)
GFR Non-African American: >60 mL/min (ref >60)
Glucose: 106 mg/dL — ABNORMAL HIGH (ref 70–99)
Potassium: 3.5 mmol/L — ABNORMAL LOW (ref 3.7–5.3)
Sodium: 137 mmol/L (ref 135–144)
Total Bilirubin: 0.19 mg/dL — ABNORMAL LOW (ref 0.3–1.2)
Total Protein: 7.2 g/dL (ref 6.4–8.3)

## 2020-03-17 LAB — LIPASE: Lipase: 18 U/L (ref 13–60)

## 2020-03-17 LAB — URINALYSIS
Bilirubin Urine: NEGATIVE
Glucose, Ur: NEGATIVE
Leukocyte Esterase, Urine: NEGATIVE
Nitrite, Urine: NEGATIVE
Protein, UA: NEGATIVE
Specific Gravity, UA: 1.012 (ref 1.000–1.030)
Urine Hgb: NEGATIVE
Urobilinogen, Urine: NORMAL
pH, UA: 7.5 (ref 5.0–8.0)

## 2020-03-17 LAB — COVID-19 & INFLUENZA COMBO
INFLUENZA A: NOT DETECTED
INFLUENZA B: NOT DETECTED
SARS-CoV-2 RNA, RT PCR: DETECTED — AB

## 2020-03-17 LAB — MAGNESIUM: Magnesium: 1.7 mg/dL (ref 1.6–2.6)

## 2020-03-17 LAB — HCG, SERUM, QUALITATIVE: hCG Qual: NEGATIVE

## 2020-03-17 MED ORDER — ONDANSETRON HCL 4 MG/2ML IJ SOLN
4 MG/2ML | Freq: Once | INTRAMUSCULAR | Status: AC
Start: 2020-03-17 — End: 2020-03-17
  Administered 2020-03-17: 16:00:00 via INTRAVENOUS

## 2020-03-17 MED ORDER — IBUPROFEN 200 MG PO TABS
200 MG | Freq: Once | ORAL | Status: AC
Start: 2020-03-17 — End: 2020-03-17
  Administered 2020-03-17: 17:00:00 via ORAL

## 2020-03-17 MED ORDER — SODIUM CHLORIDE 0.9% BOLUS (FLUID RESUSCITATION)
0.9 | Freq: Once | INTRAVENOUS | Status: AC
Start: 2020-03-17 — End: 2020-03-17
  Administered 2020-03-17: 16:00:00 via INTRAVENOUS

## 2020-03-17 MED ORDER — KETOROLAC TROMETHAMINE 30 MG/ML IJ SOLN
30 MG/ML | Freq: Once | INTRAMUSCULAR | Status: DC
Start: 2020-03-17 — End: 2020-03-17

## 2020-03-17 MED FILL — ONDANSETRON HCL 4 MG/2ML IJ SOLN: 4 MG/2ML | INTRAMUSCULAR | Qty: 2

## 2020-03-17 MED FILL — IBUPROFEN 200 MG PO TABS: 200 mg | ORAL | Qty: 2

## 2020-03-17 NOTE — ED Triage Notes (Signed)
Mode of arrival (squad #, walk in, police, etc) : [place]        Stage manager complaint(s): cough, congestion, pharyngitis, generalized body aches    Arrival Note (brief scenario, treatment PTA, etc).: pt. To ED via EMS after she woke up this morning with a sore throat, cough, generalized body aches, and congestion. Pt. States she feels similar to when she had COVID/flu in October. Pt. States she has had 2 pfizer doses of pfizer covid vaccine. Pt. States she took 2 extra strength tylenol this morning which did no relieve her pain. Pt. States she has hx of abnormal heart rate since it is usually high and she has to take metroprol to control it which she has not been taking.          C= "Have you ever felt that you should Cut down on your drinking?"  No  A= "Have people Annoyed you by criticizing your drinking?"  No  G= "Have you ever felt bad or Guilty about your drinking?"  No  E= "Have you ever had a drink as an Eye-opener first thing in the morning to steady your nerves or to help a hangover?"  No      Deferred []       Reason for deferring: N/A    *If yes to two or more: probable alcohol abuse.*

## 2020-03-17 NOTE — ED Notes (Signed)
Bed: 05  Expected date:   Expected time:   Means of arrival: [facility]  Comments:     Mingo Amber, RN  03/17/20 [phone]
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
## 2020-03-18 LAB — EKG 12-LEAD
Atrial Rate: 110 {beats}/min
P Axis: 28 degrees
P-R Interval: 130 ms
Q-T Interval: 332 ms
QRS Duration: 68 ms
QTc Calculation (Bazett): 449 ms
R Axis: 74 degrees
T Axis: 69 degrees
Ventricular Rate: 110 {beats}/min

## 2020-03-18 NOTE — Care Coordination-Inpatient (Signed)
Patient contacted regarding COVID-19 diagnosis. Discussed COVID-19 related testing which was available at this time. Test results were positive. Patient informed of results, if available? Yes.      Ambulatory Care Manager contacted the patient by telephone to perform post discharge assessment. Call within 2 business days of discharge: Yes. Verified name and DOB with patient as identifiers. Provided introduction to self, and explanation of the CTN/ACM role, and reason for call due to risk factors for infection and/or exposure to COVID-19.     Symptoms reviewed with patient who verbalized the following symptoms: pain or aching joints, cough, no new symptoms, no worsening symptoms and sore throat, congestion.      Due to no new or worsening symptoms encounter was not routed to provider for escalation. Discussed follow-up appointments. If no appointment was previously scheduled, appointment scheduling offered: Yes and she stated that she is waiting for her insurance to find a new provider.  [facility] follow up appointment(s): No future appointments.  Non-[facility] follow up appointment(s): n/a    Non-face-to-face services provided:  Obtained and reviewed discharge summary and/or continuity of care documents  Education of patient/family/caregiver/guardian to support self-management-reviewed isolation guidelines; symptom management      Advance Care Planning:   Does patient have an Advance Directive:  not on file.     Educated patient about risk for severe COVID-19 due to risk factors according to CDC guidelines. ACM reviewed discharge instructions, medical action plan and red flag symptoms with the patient who verbalized understanding. Discussed COVID vaccination status: No. Education provided on COVID-19 vaccination as appropriate. Discussed exposure protocols and quarantine with CDC Guidelines. Patient was given an opportunity to verbalize any questions and concerns and agrees to contact ACM or health care provider for questions  related to their healthcare.    Reviewed and educated patient on any new and changed medications related to discharge diagnosis     Was patient discharged with a pulse oximeter? No Discussed and confirmed pulse oximeter discharge instructions and when to notify provider or seek emergency care.       ACM provided contact information. Plan for follow-up call in 5-7 days based on severity of symptoms and risk factors.    Brenda Zamora states she still is not feeling well. She is taking benadryl for her symptoms. Encouraged her to take Tylenol and/or Ibuprofen for her aches and pains. Encouraged rest and lots of fluids to help stay hydrated. Encouraged using a humidifier or vaporizer to help with dryness and congestion.

## 2020-03-25 NOTE — Care Coordination-Inpatient (Signed)
Your Patient resolved from the Care Transitions episode on 03/18/2020  Discussed COVID-19 related testing which was available at this time. Test results were positive. Patient informed of results, if available? Yes    Patient/family has been provided the following resources and education related to COVID-19:                         Signs, symptoms and red flags related to COVID-19            CDC exposure and quarantine guidelines            [facility] exposure contact - [phone]            Contact for their local Department of Health                 Patient currently reports that the following symptoms have improved:  sore throat, congestion, pain or aching joints, cough and no new/worsening symptoms     No further outreach scheduled with this CTN/ACM.  Episode of Care resolved.  Patient has this CTN/ACM contact information if future needs arise.    Brenda Zamora states she is feeling better. Most symptoms have resolved. She said her taste and smell comes and goes. Encouraged follow up with PCP.

## 2020-04-27 ENCOUNTER — Emergency Department: Admit: 2020-04-28 | Payer: PRIVATE HEALTH INSURANCE | Primary: Family Medicine

## 2020-04-27 DIAGNOSIS — R112 Nausea with vomiting, unspecified: Secondary | ICD-10-CM
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
## 2020-04-27 NOTE — Discharge Instructions (Signed)
Continue taking your Keppra daily as you should, follow-up with your family doctor or neurologist within 1 to 2 days.  Take the Zofran until is gone make sure you are drinking plenty of fluids 10 to 12 glasses of water daily.  Return back to emergency setting if you are worsening.

## 2020-04-28 ENCOUNTER — Inpatient Hospital Stay
Admit: 2020-04-28 | Discharge: 2020-04-28 | Disposition: A | Discharge status: Home / Self Care | Payer: PRIVATE HEALTH INSURANCE | Attending: Emergency Medicine

## 2020-04-28 LAB — CBC WITH AUTO DIFFERENTIAL
Absolute Eos #: 0 10*3/uL (ref 0.0–0.4)
Absolute Lymph #: 1.3 10*3/uL (ref 1.0–4.8)
Absolute Mono #: 0.4 10*3/uL (ref 0.1–1.2)
Basophils Absolute: 0 10*3/uL (ref 0.0–0.2)
Basophils: 1 % (ref 0–2)
Eosinophils %: 0 % — ABNORMAL LOW (ref 1–4)
Hematocrit: 31.3 % — ABNORMAL LOW (ref 36–46)
Hemoglobin: 10.1 g/dL — ABNORMAL LOW (ref 12.0–16.0)
Lymphocytes: 23 % — ABNORMAL LOW (ref 24–44)
MCH: 23.7 pg — ABNORMAL LOW (ref 26–34)
MCHC: 32.1 g/dL (ref 31–37)
MCV: 73.7 fL — ABNORMAL LOW (ref 80–100)
MPV: 9.2 fL (ref 6.0–12.0)
Monocytes: 7 % (ref 2–11)
Platelets: 297 10*3/uL (ref 140–450)
RBC: 4.25 m/uL (ref 4.0–5.2)
RDW: 19.5 % — ABNORMAL HIGH (ref 12.5–15.4)
Seg Neutrophils: 69 % — ABNORMAL HIGH (ref 36–66)
Segs Absolute: 3.8 10*3/uL (ref 1.8–7.7)
WBC: 5.5 10*3/uL (ref 3.5–11.0)

## 2020-04-28 LAB — TROPONIN: Troponin, High Sensitivity: <6 ng/L (ref 0–14)

## 2020-04-28 LAB — BASIC METABOLIC PANEL
Anion Gap: 11 mmol/L (ref 9–17)
BUN: 7 mg/dL (ref 6–20)
CO2: 24 mmol/L (ref 20–31)
Calcium: 9.8 mg/dL (ref 8.6–10.4)
Chloride: 105 mmol/L (ref 98–107)
Creatinine: 0.99 mg/dL — ABNORMAL HIGH (ref 0.50–0.90)
GFR African American: >60 mL/min (ref >60)
GFR Non-African American: >60 mL/min (ref >60)
Glucose: 105 mg/dL — ABNORMAL HIGH (ref 70–99)
Potassium: 3.5 mmol/L — ABNORMAL LOW (ref 3.7–5.3)
Sodium: 140 mmol/L (ref 135–144)

## 2020-04-28 LAB — LEVETIRACETAM LEVEL: Levetiracetam Lvl: <2 ug/mL

## 2020-04-28 MED ORDER — ONDANSETRON 4 MG PO TBDP
4 MG | ORAL_TABLET | Freq: Three times a day (TID) | ORAL | 0 refills | Status: AC | PRN
Start: 2020-04-28 — End: ?

## 2020-04-28 MED ORDER — ONDANSETRON HCL 4 MG/2ML IJ SOLN
4 MG/2ML | INTRAMUSCULAR | Status: AC
Start: 2020-04-28 — End: 2020-04-27
  Administered 2020-04-28: 04:00:00 via INTRAVENOUS

## 2020-04-28 MED ORDER — ONDANSETRON HCL 4 MG/2ML IJ SOLN
4 MG/2ML | Freq: Once | INTRAMUSCULAR | Status: AC
Start: 2020-04-28 — End: 2020-04-27

## 2020-04-28 MED ORDER — KETOROLAC TROMETHAMINE 30 MG/ML IJ SOLN
30 MG/ML | Freq: Once | INTRAMUSCULAR | Status: AC
Start: 2020-04-28 — End: 2020-04-27
  Administered 2020-04-28: 04:00:00 via INTRAVENOUS

## 2020-04-28 MED ORDER — ONDANSETRON HCL 4 MG/2ML IJ SOLN
4 MG/2ML | Freq: Once | INTRAMUSCULAR | Status: AC
Start: 2020-04-28 — End: 2020-04-27
  Administered 2020-04-28: 03:00:00 via INTRAVENOUS

## 2020-04-28 MED ORDER — SODIUM CHLORIDE 0.9 % IV SOLN
0.9 % | Freq: Once | INTRAVENOUS | Status: AC
Start: 2020-04-28 — End: 2020-04-28
  Administered 2020-04-28: 05:00:00 via INTRAVENOUS

## 2020-04-28 MED ORDER — LEVETIRACETAM 500 MG PO TABS
500 MG | ORAL_TABLET | Freq: Two times a day (BID) | ORAL | 3 refills | Status: DC
Start: 2020-04-28 — End: 2020-04-27

## 2020-04-28 MED ORDER — LEVETIRACETAM 500 MG PO TABS
500 MG | ORAL_TABLET | Freq: Two times a day (BID) | ORAL | 0 refills | Status: AC
Start: 2020-04-28 — End: 2020-05-04

## 2020-04-28 MED ORDER — SODIUM CHLORIDE 0.9 % IV BOLUS
0.9 % | Freq: Once | INTRAVENOUS | Status: AC
Start: 2020-04-28 — End: 2020-04-27
  Administered 2020-04-28: 03:00:00 via INTRAVENOUS

## 2020-04-28 MED FILL — LEVETIRACETAM 500 MG/5ML IV SOLN: 500 MG/5ML | INTRAVENOUS | Qty: 5

## 2020-04-28 MED FILL — KETOROLAC TROMETHAMINE 30 MG/ML IJ SOLN: 30 mg/mL | INTRAMUSCULAR | Qty: 1

## 2020-04-28 MED FILL — ONDANSETRON HCL 4 MG/2ML IJ SOLN: 4 MG/2ML | INTRAMUSCULAR | Qty: 2

## 2020-04-29 LAB — EKG 12-LEAD
Atrial Rate: 71 {beats}/min
P Axis: -2 degrees
P-R Interval: 136 ms
Q-T Interval: 380 ms
QRS Duration: 78 ms
QTc Calculation (Bazett): 412 ms
R Axis: 49 degrees
T Axis: 46 degrees
Ventricular Rate: 71 {beats}/min

## 2020-06-05 LAB — APTT: aPTT: 30.2 s (ref 25.0–35.0)

## 2020-06-05 LAB — CBC WITH DIFFERENTIAL
Absolute Immature Granulocyte: 0 10*3/uL (ref 0.0–0.2)
Basophils %: 0.5 % (ref 0.0–1.0)
Basophils Absolute: 0 10*3/uL (ref 0.0–0.2)
Eosinophils %: 0.5 % (ref 0.0–6.0)
Eosinophils Absolute: 0 10*3/uL (ref 0.0–0.5)
Hematocrit: 30.5 % — ABNORMAL LOW (ref 36.0–45.0)
Hemoglobin: 9.4 g/dL — ABNORMAL LOW (ref 12.0–15.0)
Immature Granulocytes: 0.2 % (ref 0.0–1.0)
Lymphocytes %: 51.9 % — ABNORMAL HIGH (ref 20.0–45.0)
Lymphocytes Absolute: 3.4 10*3/uL (ref 1.2–4.0)
MCH: 22.7 pg — ABNORMAL LOW (ref 27.0–33.0)
MCHC: 30.8 g/dL — ABNORMAL LOW (ref 32.0–35.0)
MCV: 73.5 fL — ABNORMAL LOW (ref 82.0–98.0)
Monocytes %: 6.9 % (ref 5.0–12.0)
Monocytes Absolute: 0.5 10*3/uL (ref 0.1–1.0)
Neutrophils %: 40 % (ref 40.0–72.0)
Neutrophils Absolute: 2.6 10*3/uL (ref 1.6–7.6)
Platelets: 301 10*3/uL (ref 150–400)
RBC: 4.15 10*6/uL (ref 3.80–5.00)
RDW: 19.2 % — ABNORMAL HIGH (ref 11.5–15.0)
WBC: 6.53 10*3/uL (ref 4.00–10.60)
nRBC: 0 % (ref 0–0)

## 2020-06-05 LAB — BASIC METABOLIC PANEL
BUN: 7 mg/dL (ref 7–25)
CO2: 22 meq/L (ref 21–31)
Calcium: 9.4 mg/dL (ref 8.6–10.3)
Chloride: 106 meq/L (ref 98–107)
Creatinine: 1.06 mg/dL (ref 0.60–1.20)
EGFR IF NonAfrican American: >60 mL/min/{1.73_m2} (ref >60)
Glucose: 83 mg/dL (ref 70–100)
Potassium: 3.7 meq/L (ref 3.5–5.1)
Sodium: 138 meq/L (ref 136–145)
eGFR African American: >60 mL/min/{1.73_m2} (ref >60)

## 2020-06-05 LAB — MAGNESIUM, RBC: Magnesium: 2 mg/dL (ref 1.9–2.7)

## 2020-06-05 LAB — PROTHROMBIN TIME (PT)
INR: 1.24 — ABNORMAL HIGH (ref 0.91–1.16)
PT: 15.6 s — ABNORMAL HIGH (ref 12.3–14.8)

## 2020-06-05 LAB — TROPONIN: Troponin I: 0 ng/mL (ref 0.00–0.04)

## 2020-06-05 LAB — CK: Total CK: 81 IU/L (ref 30–223)

## 2020-06-05 LAB — POCT URINE PREGNANCY: Preg Test, Ur: NEGATIVE

## 2020-07-26 ENCOUNTER — Emergency Department: Payer: Self-pay

## 2020-07-26 ENCOUNTER — Other Ambulatory Visit: Payer: Self-pay

## 2020-07-26 ENCOUNTER — Encounter: Payer: Self-pay | Admitting: Emergency Medicine

## 2020-07-26 ENCOUNTER — Emergency Department
Admission: EM | Admit: 2020-07-26 | Discharge: 2020-07-26 | Disposition: A | Discharge status: Home / Self Care | Payer: Self-pay | Attending: Emergency Medicine | Admitting: Emergency Medicine

## 2020-07-26 DIAGNOSIS — I10 Essential (primary) hypertension: Secondary | ICD-10-CM | POA: Insufficient documentation

## 2020-07-26 DIAGNOSIS — R519 Headache, unspecified: Secondary | ICD-10-CM | POA: Insufficient documentation

## 2020-07-26 DIAGNOSIS — J45909 Unspecified asthma, uncomplicated: Secondary | ICD-10-CM | POA: Insufficient documentation

## 2020-07-26 DIAGNOSIS — Z87891 Personal history of nicotine dependence: Secondary | ICD-10-CM | POA: Insufficient documentation

## 2020-07-26 DIAGNOSIS — Z79899 Other long term (current) drug therapy: Secondary | ICD-10-CM | POA: Insufficient documentation

## 2020-07-26 LAB — POC URINE PREG, ED: Preg Test, Ur: NEGATIVE

## 2020-07-26 MED ORDER — LACTATED RINGERS IV BOLUS
1000.0000 mL | Freq: Once | INTRAVENOUS | Status: AC
  Administered 2020-07-26: 1000 mL via INTRAVENOUS

## 2020-07-26 MED ORDER — DROPERIDOL 2.5 MG/ML IJ SOLN
2.5000 mg | Freq: Once | INTRAMUSCULAR | Status: AC
  Administered 2020-07-26: 2.5 mg via INTRAVENOUS
  Filled 2020-07-26: qty 2

## 2020-07-26 MED ORDER — ACETAMINOPHEN 500 MG PO TABS
1000.0000 mg | ORAL_TABLET | Freq: Once | ORAL | Status: AC
  Administered 2020-07-26: 1000 mg via ORAL
  Filled 2020-07-26: qty 2

## 2020-07-26 NOTE — ED Triage Notes (Signed)
Pt states woke up this am at 9 with left sided headache face symmetrical, strong equal upper extremity grips. Pt states has pain in left eye and sensitivity to light in left eye. Per ems "speech seems off" speech is clear but seems like pt is taking time to repeat words back, but then can clearly and rapidly communicate in sentences.

## 2020-07-26 NOTE — ED Provider Notes (Signed)
[facility] Emergency Department Provider Note ____________________________________________   Event Date/Time   First MD Initiated Contact with Patient 07/26/20 2120     (approximate)  I have reviewed the triage vital signs and the nursing notes.  HISTORY  Chief Complaint Headache   HPI Kristy Reid is a 25 y.o. femalewho presents to the ED for evaluation of headache.   Chart review indicates various psychiatric diagnoses as listed below.  Patient presents to the ED for evaluation of a left-sided headache.  She reports getting up quickly first thing this morning from her bed, when she felt dizzy and fell to the ground.  She reports striking her head on the carpeted surface, and denies syncope.  She reports headache throughout the day today constantly since that time.  She reports not taking any medications prior to arrival.  Denies fever, syncopal episodes, emesis, gait change throughout the day today.  She reports her last menstrual period was 2 months ago and that she could be pregnant.  Denies vaginal bleeding, abdominal pain   Past Medical History:  Diagnosis Date  . Anemia   . Anemia   . Anxiety   . Asthma   . Bipolar 1 disorder (HCC)   . Chronic back pain   . Depression   . Hypertension   . Insomnia   . Personality disorder (HCC)    "boarderline"  . PTSD (post-traumatic stress disorder)     Patient Active Problem List   Diagnosis Date Noted  . Preterm contractions 06/03/2018  . Anemia affecting pregnancy in third trimester 05/30/2018  . Limited prenatal care in second trimester 05/19/2018  . Depression with suicidal ideation 05/04/2018  . Alcohol abuse 05/04/2018  . Schizoaffective disorder, bipolar type (HCC) 05/04/2018  . Anxiety disorder   . Bipolar 2 disorder (HCC) 11/11/2016  . Noncompliance 11/11/2016  . Severe recurrent major depression with psychotic features (HCC) 07/18/2016  . MDD (major depressive disorder),  recurrent, severe, with psychosis (HCC) 07/17/2016  . Decreased fetal movement 03/22/2016  . Abdominal pain in pregnancy 01/21/2016  . Constipation during pregnancy in second trimester 12/26/2015  . Overdose 11/29/2015  . Supervision of high risk pregnancy, antepartum 11/15/2015  . Cannabis use disorder, severe, dependence (HCC) 11/15/2015  . Tobacco use disorder 11/15/2015  . Asthma 11/15/2015  . Borderline personality disorder (HCC) 11/15/2015  . PTSD (post-traumatic stress disorder) 11/15/2015  . Severe recurrent major depression without psychotic features (HCC) 11/13/2015    Past Surgical History:  Procedure Laterality Date  . BUNIONECTOMY Bilateral    feet  . wrist sugery      Prior to Admission medications   Medication Sig Start Date End Date Taking? Authorizing Provider  acetaminophen (TYLENOL) 500 MG tablet Take 500-1,000 mg by mouth every 6 (six) hours as needed for mild pain or moderate pain.    [provider]  albuterol (VENTOLIN HFA) 108 (90 Base) MCG/ACT inhaler Inhale 2 puffs into the lungs every 6 (six) hours as needed for wheezing or shortness of breath.    [provider]  fexofenadine-pseudoephedrine (ALLEGRA-D) 60-120 MG 12 hr tablet Take 1 tablet by mouth 2 (two) times daily. 02/01/19   Joni Reining, PA-C  fluticasone (FLONASE) 50 MCG/ACT nasal spray Place 1 spray into both nostrils daily.    [provider]  hydrOXYzine (ATARAX/VISTARIL) 10 MG tablet Take 10 mg by mouth 3 (three) times daily as needed for itching or anxiety.    [provider]  meclizine (ANTIVERT) 25 MG  tablet Take 25 mg by mouth 3 (three) times daily as needed for dizziness.    [provider]  metoprolol tartrate (LOPRESSOR) 50 MG tablet Take 1.5 tablets (75 mg total) by mouth 2 (two) times daily. 10/09/19 01/07/20  Debbe Odea, MD  QUEtiapine (SEROQUEL) 300 MG tablet Take 300 mg by mouth at bedtime.     [provider]  traZODone  (DESYREL) 150 MG tablet Take 150 mg by mouth at bedtime.     [provider]  loratadine (CLARITIN) 10 MG tablet Take 1 tablet (10 mg total) by mouth daily. 05/05/18 08/31/19  [name], [name], MD    Allergies Penicillins, Rocephin [ceftriaxone], Zithromax [azithromycin], and Lidocaine  Family History  Problem Relation Age of Onset  . Diabetes Mother   . Hypertension Mother   . Mental illness Mother   . Diabetes Maternal Grandmother   . Heart disease Maternal Grandmother   . Cancer Neg Hx   . Ovarian cancer Neg Hx     Social History Social History   Tobacco Use  . Smoking status: Former Smoker    Types: Cigarettes    Quit date: 08/13/2015    Years since quitting: 4.9  . Smokeless tobacco: Never Used  Vaping Use  . Vaping Use: Never used  Substance Use Topics  . Alcohol use: No  . Drug use: Yes    Frequency: 7.0 times per week    Types: Marijuana    Comment: daily use, last use today 08/31/2019    Review of Systems  Constitutional: No fever/chills Eyes: No visual changes. ENT: No sore throat. Cardiovascular: Denies chest pain. Respiratory: Denies shortness of breath. Gastrointestinal: No abdominal pain.  No nausea, no vomiting.  No diarrhea.  No constipation. Genitourinary: Negative for dysuria. Musculoskeletal: Negative for back pain. Skin: Negative for rash. Neurological: Negative for focal weakness or numbness.  Positive for dizziness and headache  ____________________________________________   PHYSICAL EXAM:  VITAL SIGNS: Vitals:   07/26/20 2151 07/26/20 2300  BP: 124/75 125/82  Pulse: 93 75  Resp: 19 20  Temp:    SpO2: 100% 100%     Constitutional: Alert and oriented.  Apparent photophobia, preferring a dark room.  She has a slow speech cadence, but otherwise interacting appropriately. Eyes: Conjunctivae are normal. PERRL. EOMI. Head: Atraumatic. Nose: No congestion/rhinnorhea. Mouth/Throat: Mucous membranes are moist.  Oropharynx  non-erythematous. Neck: No stridor. No cervical spine tenderness to palpation. Cardiovascular: Normal rate, regular rhythm. Grossly normal heart sounds.  Good peripheral circulation. Respiratory: Normal respiratory effort.  No retractions. Lungs CTAB. Gastrointestinal: Soft , nondistended, nontender to palpation. No CVA tenderness. Musculoskeletal: No lower extremity tenderness nor edema.  No joint effusions. No signs of acute trauma. Neurologic:  Normal speech and language. No gross focal neurologic deficits are appreciated. No gait instability noted. Cranial nerves II through XII intact 5/5 strength and sensation in all 4 extremities Skin:  Skin is warm, dry and intact. No rash noted. Psychiatric: Mood and affect are normal. Speech and behavior are normal.  ____________________________________________   LABS (all labs ordered are listed, but only abnormal results are displayed)  Labs Reviewed  POC URINE PREG, ED   ____________________________________________  12 Lead EKG  Sinus rhythm, rate of 78 bpm.  Normal axis and intervals.  No evidence of acute ischemia. ____________________________________________  RADIOLOGY  ED MD interpretation: CT head reviewed by me without evidence of acute intracranial pathology.  Official radiology report(s): CT Head Wo Contrast  Result Date: 07/26/2020 CLINICAL DATA:  Left-sided  headache. EXAM: CT HEAD WITHOUT CONTRAST TECHNIQUE: Contiguous axial images were obtained from the base of the skull through the vertex without intravenous contrast. COMPARISON:  CT head dated December 30, 2018. FINDINGS: Brain: No evidence of acute infarction, hemorrhage, hydrocephalus, extra-axial collection or mass lesion/mass effect. Vascular: No hyperdense vessel or unexpected calcification. Skull: Normal. Negative for fracture or focal lesion. Sinuses/Orbits: No acute finding. Other: None. IMPRESSION: 1. Normal noncontrast head CT. Electronically Signed   By: Obie Dredge M.D.   On: 07/26/2020 20:09    ____________________________________________   PROCEDURES and INTERVENTIONS  Procedure(s) performed (including Critical Care):  .1-3 Lead EKG Interpretation Performed by: Delton Prairie, MD Authorized by: Delton Prairie, MD     Interpretation: normal     ECG rate:  72   ECG rate assessment: normal     Rhythm: sinus rhythm     Ectopy: none     Conduction: normal      Medications  acetaminophen (TYLENOL) tablet 1,000 mg (1,000 mg Oral Given 07/26/20 2146)  lactated ringers bolus 1,000 mL (1,000 mLs Intravenous New Bag/Given 07/26/20 2148)  droperidol (INAPSINE) 2.5 MG/ML injection 2.5 mg (2.5 mg Intravenous Given 07/26/20 2147)    ____________________________________________   MDM / ED COURSE   76-year-old woman presents to the ED with migrainous headache, resolved with migraine cocktail, and amenable to outpatient management.  Normal vitals on room air.  Exam reassuring without evidence of neurologic or vascular deficits.  No signs of trauma.  She reports a mechanical fall this morning the setting of presyncopal dizziness upon getting up from bed.  No evidence of traumatic pathology or ICH on CT imaging.  EKG without concerning interval changes.  Patient is not pregnant.  Resolution of symptoms after migraine cocktail and patient is requesting discharge.  See no barriers to outpatient management.  Return precautions for the ED were discussed.   Clinical Course as of 07/26/20 2332  Fri Jul 26, 2020  2220 Reassessed.  Patient reports feeling better.  She was sitting up in bed and on her phone.  I discussed the importance of a urine sample, and she expresses understanding. [DS]  2329 Reassessed.  Reports resolution of headache.  Reports eager to go home.  Just now provided urine sample.  We discussed waiting for this for a pregnancy test prior to discharge. [DS]  2332 Pregnancy test negative [DS]    Clinical Course User Index [DS] Delton Prairie, MD     ____________________________________________   FINAL CLINICAL IMPRESSION(S) / ED DIAGNOSES  Final diagnoses:  Bad headache     ED Discharge Orders    None       Mikaele Stecher   Note:  This document was prepared using Dragon voice recognition software and may include unintentional dictation errors.   Delton Prairie, MD 07/26/20 [phone]

## 2020-07-26 NOTE — Discharge Instructions (Signed)
Use Tylenol for pain and fevers.  Up to 1000 mg per dose, up to 4 times per day.  Do not take more than 4000 mg of Tylenol/acetaminophen within 24 hours.. Use naproxen/Aleve for anti-inflammatory pain relief. Use up to 500mg  every 12 hours. Do not take more frequently than this. Do not use other NSAIDs (ibuprofen, Advil) while taking this medication. It is safe to take Tylenol with this.   You are not pregnant

## 2021-02-25 ENCOUNTER — Inpatient Hospital Stay
Admit: 2021-02-25 | Discharge: 2021-02-25 | Disposition: A | Discharge status: Home / Self Care | Attending: Student in an Organized Health Care Education/Training Program

## 2021-02-25 ENCOUNTER — Emergency Department: Admit: 2021-02-25 | Payer: PRIVATE HEALTH INSURANCE

## 2021-02-25 DIAGNOSIS — J101 Influenza due to other identified influenza virus with other respiratory manifestations: Secondary | ICD-10-CM

## 2021-02-25 LAB — URINALYSIS WITH REFLEX TO CULTURE
Bilirubin Urine: NEGATIVE
Glucose, Ur: NEGATIVE
Nitrite, Urine: NEGATIVE
Specific Gravity, UA: 1.033 — ABNORMAL HIGH (ref 1.000–1.030)
Urobilinogen, Urine: NORMAL
pH, UA: 6 (ref 5.0–8.0)

## 2021-02-25 LAB — EKG 12-LEAD
Atrial Rate: 93 {beats}/min
P Axis: 21 degrees
P-R Interval: 124 ms
Q-T Interval: 356 ms
QRS Duration: 76 ms
QTc Calculation (Bazett): 442 ms
R Axis: 68 degrees
T Axis: 45 degrees
Ventricular Rate: 93 {beats}/min

## 2021-02-25 LAB — MICROSCOPIC URINALYSIS
Epithelial Cells UA: 10 /HPF
RBC, UA: 3 /HPF
WBC, UA: 6 /HPF

## 2021-02-25 LAB — COVID-19 & INFLUENZA COMBO
INFLUENZA A: DETECTED — AB
INFLUENZA B: NOT DETECTED
SARS-CoV-2 RNA, RT PCR: NOT DETECTED

## 2021-02-25 MED ORDER — SODIUM CHLORIDE (PF) 0.9 % IJ SOLN
0.9 % | Freq: Once | INTRAMUSCULAR | Status: AC
Start: 2021-02-25 — End: 2021-02-25
  Administered 2021-02-25: 15:00:00 20 mg via INTRAVENOUS

## 2021-02-25 MED ORDER — SODIUM CHLORIDE 0.9 % IV BOLUS
0.9 % | Freq: Once | INTRAVENOUS | Status: AC
Start: 2021-02-25 — End: 2021-02-25
  Administered 2021-02-25: 15:00:00 1000 mL via INTRAVENOUS

## 2021-02-25 MED ORDER — ONDANSETRON HCL 4 MG/2ML IJ SOLN
4 MG/2ML | Freq: Once | INTRAMUSCULAR | Status: AC
Start: 2021-02-25 — End: 2021-02-25
  Administered 2021-02-25: 13:00:00 4 mg via INTRAVENOUS

## 2021-02-25 MED ORDER — PROMETHAZINE HCL 25 MG/ML IJ SOLN
25 MG/ML | Freq: Once | INTRAMUSCULAR | Status: AC
Start: 2021-02-25 — End: 2021-02-25
  Administered 2021-02-25: 15:00:00 25 mg via INTRAMUSCULAR

## 2021-02-25 MED ORDER — PROMETHAZINE HCL 25 MG RE SUPP
25 MG | Freq: Four times a day (QID) | RECTAL | 0 refills | Status: AC | PRN
Start: 2021-02-25 — End: 2021-03-04

## 2021-02-25 MED ORDER — PROMETHAZINE HCL 25 MG PO TABS
25 MG | ORAL_TABLET | Freq: Four times a day (QID) | ORAL | 0 refills | Status: AC | PRN
Start: 2021-02-25 — End: 2021-03-04

## 2021-02-25 MED ORDER — SULFAMETHOXAZOLE-TRIMETHOPRIM 800-160 MG PO TABS
800-160 MG | ORAL_TABLET | Freq: Two times a day (BID) | ORAL | 0 refills | Status: AC
Start: 2021-02-25 — End: 2021-03-02

## 2021-02-25 MED ORDER — KETOROLAC TROMETHAMINE 30 MG/ML IJ SOLN
30 MG/ML | Freq: Once | INTRAMUSCULAR | Status: AC
Start: 2021-02-25 — End: 2021-02-25
  Administered 2021-02-25: 13:00:00 15 mg via INTRAVENOUS

## 2021-02-25 MED ORDER — SODIUM CHLORIDE 0.9 % IV BOLUS
0.9 % | Freq: Once | INTRAVENOUS | Status: AC
Start: 2021-02-25 — End: 2021-02-25
  Administered 2021-02-25: 13:00:00 1000 mL via INTRAVENOUS

## 2021-02-25 MED FILL — KETOROLAC TROMETHAMINE 30 MG/ML IJ SOLN: 30 mg/mL | INTRAMUSCULAR | Qty: 1

## 2021-02-25 MED FILL — ONDANSETRON HCL 4 MG/2ML IJ SOLN: 4 MG/2ML | INTRAMUSCULAR | Qty: 2

## 2021-02-25 MED FILL — FAMOTIDINE (PF) 20 MG/2ML IV SOLN: 20 MG/2ML | INTRAVENOUS | Qty: 2

## 2021-02-25 MED FILL — PROMETHAZINE HCL 25 MG/ML IJ SOLN: 25 MG/ML | INTRAMUSCULAR | Qty: 1

## 2021-02-25 NOTE — Discharge Instructions (Signed)
Please establish care with a primary care provider  You may take Tylenol up to 1 g every 8 hours as needed for pain in addition to Motrin to 600 mg every 8 hours as needed for pain  I would start with the Phenergan suppository as needed for nausea.  When she feels that her nausea is more under control you may take the Phenergan tablet  Please stay well-hydrated  And please start taking the Bactrim today if you can tolerate it and follow-up your results of your urine culture on MyChart

## 2021-02-25 NOTE — ED Provider Notes (Signed)
[facility]  Emergency Department Encounter  Emergency Medicine Physician     Pt Name: Brenda Zamora  MRN: [id number]  Birthdate March 03, 1996  Date of evaluation: 02/25/21  PCP:  No primary care provider on file.    CHIEF COMPLAINT       Chief Complaint   Patient presents with    Chest Pain    Shortness of Breath    Dysuria     Patient c/o sob and a cough that started 2 days ago, patient reports she woke up around 2am this morning with chest pain. Patient reports low oral intake and having difficulties urinating.        HISTORY OF PRESENT ILLNESS  (Location/Symptom, Timing/Onset, Context/Setting, Quality, Duration, Modifying Factors, Severity.)    Brenda Zamora is a 25 y.o. female who presents with cough, congestion, dysuria worsening over the past 3 days.  Patient states that she has had a worsening cough since that time.  Has been productive of some yellow, occasionally green sputum.  She states that she has also had some fevers, chills, nausea, vomiting, diarrhea.  States that she has not been able to keep much food or water down and feels "very dehydrated".  She states that she was having some shortness of breath yesterday but is not currently.  She states the same for chest pain and only states it hurts after she coughs.  Denies any abdominal pain.          PAST MEDICAL / SURGICAL / SOCIAL / FAMILY HISTORY    has a past medical history of Anemia, Asthma, Bipolar 1 disorder (HCC), Borderline personality disorder (HCC), Depression, Gestational HTN, History of blood transfusion, Hx PTSD, Insomnia, PTSD (post-traumatic stress disorder), Schizophrenia (HCC), Sciatica, Seizure in childhood (HCC), and Vertigo.     has a past surgical history that includes Bunionectomy and Wrist surgery.    Social History     Socioeconomic History    Marital status: Single     Spouse name: Not on file    Number of children: Not on file    Years of education: Not on file    Highest education level: Not on file   Occupational  History    Not on file   Tobacco Use    Smoking status: Former    Smokeless tobacco: Never    Tobacco comments:     pt quit when she learned she was preg    Vaping Use    Vaping Use: Never used   Substance and Sexual Activity    Alcohol use: Yes     Comment: occasionally    Drug use: Yes     Frequency: 7.0 times per week     Types: Marijuana (Weed)     Comment: none in the last 24 hours    Sexual activity: Yes     Partners: Male   Other Topics Concern    Not on file   Social History Narrative    Not on file     Social Determinants of Health     Financial Resource Strain: Not on file   Food Insecurity: Not on file   Transportation Needs: Not on file   Physical Activity: Not on file   Stress: Not on file   Social Connections: Not on file   Intimate Partner Violence: Not on file   Housing Stability: Not on file       Family History   Problem Relation Age of Onset    Diabetes Mother  Hypertension Mother     Diabetes Father     Hypertension Father     Crohn's Disease Father     Other Father     Other Brother         Pulmonary Hypertension in one brother    Diabetes Maternal Grandmother     Other Maternal Grandmother     Diabetes Paternal Grandmother     Lupus Sister         2 sisters with lupus   1 with HTN and diabetes       Allergies:    Rocephin [ceftriaxone], Penicillins, Lidocaine viscous hcl, and Zithromax [azithromycin]    Home Medications:  Prior to Admission medications    Medication Sig Start Date End Date Taking? Authorizing Provider   promethazine (PHENERGAN) 25 MG suppository Place 1 suppository rectally every 6 hours as needed for Nausea WARNING:  May cause drowsiness.  May impair ability to operate vehicles or machinery.  Do not use in combination with alcohol. 02/25/21 03/04/21 Yes Halbert Jesson, DO   promethazine (PHENERGAN) 25 MG tablet Take 1 tablet by mouth every 6 hours as needed for Nausea WARNING:  May cause drowsiness.  May impair ability to operate vehicles or machinery.  Do not use in  combination with alcohol. 02/25/21 03/04/21 Yes Shekela Goodridge, DO   sulfamethoxazole-trimethoprim (BACTRIM DS) 800-160 MG per tablet Take 1 tablet by mouth 2 times daily for 5 days 02/25/21 03/02/21 Yes Vinay Ertl, DO   albuterol sulfate HFA 108 (90 Base) MCG/ACT inhaler Inhale 2 puffs into the lungs every 4 hours as needed for Wheezing    Historical Provider, MD   ondansetron (ZOFRAN ODT) 4 MG disintegrating tablet Take 1 tablet by mouth every 8 hours as needed for Nausea 04/27/20   Baird Cancer III, MD   levETIRAcetam (KEPPRA) 500 MG tablet Take 1 tablet by mouth 2 times daily for 7 days 04/27/20 05/04/20  Baird Cancer III, MD   diclofenac sodium (VOLTAREN) 1 % GEL Apply 4 g topically 4 times daily 03/11/20   Bruce J Grattan, DO   fluticasone (VERAMYST) 27.5 MCG/SPRAY nasal spray 2 sprays by Each Nostril route daily    Historical Provider, MD   ibuprofen (ADVIL;MOTRIN) 800 MG tablet Take 1 tablet by mouth 2 times daily as needed for Pain 11/19/19 12/03/19  Randie Heinz, DO   acetaminophen (TYLENOL) 500 MG tablet Take 2 tablets by mouth 3 times daily for 14 days 11/19/19 04/27/20  Randie Heinz, DO   aspirin EC 81 MG EC tablet Take 1 tablet by mouth daily 02/08/18   Alver Fisher, DO       REVIEW OF SYSTEMS    (2-9 systems for level 4, 10 or more for level 5)    Review of Systems   Constitutional:  Positive for chills, fatigue and fever.   HENT:  Negative for congestion and rhinorrhea.    Respiratory:  Positive for cough and chest tightness. Negative for shortness of breath.    Cardiovascular:  Negative for chest pain.   Gastrointestinal:  Positive for diarrhea, nausea and vomiting. Negative for abdominal pain.   Genitourinary:  Positive for decreased urine volume and dysuria. Negative for flank pain.   Musculoskeletal:  Positive for myalgias. Negative for back pain.   Neurological:  Negative for headaches.   All other systems reviewed and are negative.    PHYSICAL EXAM   (up to 7 for level 4, 8 or more  for level 5)  INITIAL VITALS:   ED Triage Vitals [02/25/21 0653]   BP Temp Temp src Heart Rate Resp SpO2 Height Weight   (!) 135/91 99 ??F (37.2 ??C) -- (!) 110 20 99 % 5\' 2"  (1.575 m) 132 lb (59.9 kg)       Physical Exam  Vitals and nursing note reviewed.   Constitutional:       General: She is not in acute distress.     Appearance: She is well-developed. She is not ill-appearing.   Cardiovascular:      Rate and Rhythm: Normal rate and regular rhythm.      Pulses: Normal pulses.           Radial pulses are 2+ on the right side and 2+ on the left side.   Pulmonary:      Effort: Pulmonary effort is normal. No respiratory distress.      Breath sounds: Rhonchi present. No decreased breath sounds or wheezing.      Comments: Scattered rhonchi throughout all lung fields  Abdominal:      General: There is no distension.      Palpations: Abdomen is soft.      Tenderness: There is no abdominal tenderness.   Musculoskeletal:      Right lower leg: No tenderness. No edema.      Left lower leg: No tenderness. No edema.   Skin:     General: Skin is warm and dry.      Capillary Refill: Capillary refill takes less than 2 seconds.   Neurological:      Mental Status: She is alert and oriented to person, place, and time.       DIFFERENTIAL  DIAGNOSIS   PLAN (LABS / IMAGING / EKG):  Orders Placed This Encounter   Procedures    COVID-19 & Influenza Combo    Culture, Urine    XR CHEST PORTABLE    Urinalysis with Reflex to Culture    Microscopic Urinalysis    EKG 12 Lead       MEDICATIONS ORDERED:  Orders Placed This Encounter   Medications    0.9 % sodium chloride bolus    ondansetron (ZOFRAN) injection 4 mg    ketorolac (TORADOL) injection 15 mg    0.9 % sodium chloride bolus    promethazine (PHENERGAN) injection 25 mg    famotidine (PEPCID) 20 mg in sodium chloride (PF) 0.9 % 10 mL injection    promethazine (PHENERGAN) 25 MG suppository     Sig: Place 1 suppository rectally every 6 hours as needed for Nausea WARNING:  May cause  drowsiness.  May impair ability to operate vehicles or machinery.  Do not use in combination with alcohol.     Dispense:  20 suppository     Refill:  0    promethazine (PHENERGAN) 25 MG tablet     Sig: Take 1 tablet by mouth every 6 hours as needed for Nausea WARNING:  May cause drowsiness.  May impair ability to operate vehicles or machinery.  Do not use in combination with alcohol.     Dispense:  10 tablet     Refill:  0    sulfamethoxazole-trimethoprim (BACTRIM DS) 800-160 MG per tablet     Sig: Take 1 tablet by mouth 2 times daily for 5 days     Dispense:  10 tablet     Refill:  0         DIAGNOSTIC RESULTS / EMERGENCYDEPARTMENT COURSE / MDM   LABS:  Labs Reviewed   COVID-19 & INFLUENZA COMBO - Abnormal; Notable for the following components:       Result Value    INFLUENZA A DETECTED (*)     All other components within normal limits   URINALYSIS WITH REFLEX TO CULTURE - Abnormal; Notable for the following components:    Color, UA Dark Yellow (*)     Turbidity UA Cloudy (*)     Ketones, Urine SMALL (*)     Specific Gravity, UA 1.033 (*)     Urine Hgb TRACE (*)     Protein, UA 2+ (*)     Leukocyte Esterase, Urine TRACE (*)     All other components within normal limits   MICROSCOPIC URINALYSIS - Abnormal; Notable for the following components:    Bacteria, UA MODERATE (*)     Mucus, UA 1+ (*)     All other components within normal limits   CULTURE, URINE       RADIOLOGY:  XR CHEST PORTABLE    Result Date: 02/25/2021  EXAMINATION: ONE XRAY VIEW OF THE CHEST 02/25/2021 7:12 am COMPARISON: 02/16/2020 HISTORY: ORDERING SYSTEM PROVIDED HISTORY: cough, congestion, scattered rhonchi TECHNOLOGIST PROVIDED HISTORY: cough, congestion, scattered rhonchi Reason for Exam: cough, congestion, scattered rhonchi FINDINGS: The lungs are without acute focal process.  There is no effusion or pneumothorax. The cardiomediastinal silhouette is without acute process. The osseous structures are without acute process.     No acute process.       EKG    EKG Interpretation    Interpreted by me    Rhythm: normal sinus   Rate: normal, 93  Axis: normal  Ectopy: none  Conduction: normal  ST Segments: no acute change  T Waves: no acute change  Q Waves: none    Clinical Impression: Sinus rhythm rate of 93.  No ST segment changes, no arrhythmia, no ectopy.  Normal axis, poor R wave progression.        All EKG's are interpreted by the Emergency Department Physician whoeither signs or Co-signs this chart in the absence of a cardiologist.    Impression:  Patient presents cough, congestion, fever, chills, nausea, vomiting, diarrhea, worsening over the past 3 days.  High concern for viral illness especially influenza.  Patient's not sure if she has been or anybody ill.  Initially she was tachycardic on arrival but heart rates decreased into the 90s.  Scattered rhonchi throughout all lung fields.  No wheezing.  Oral mucosas dry.  For now we will check for COVID, influenza, chest x-ray, rehydration, and check urinalysis.      EMERGENCY DEPARTMENT COURSE:  ED Course as of 02/25/21 1812   Tue Feb 25, 2021   0901 Patient reassessed.  She is influenza positive.  Urinalysis contaminated but her symptoms are suggestive of a UTI.  Discussed this with patient.  She still feeling nauseated.  We will give another liter fluid, Pepcid, Phenergan and reassess.  Will likely start patient on an antibiotic and have her follow-up the results of urine culture on MyChart. [AP]   1020 Patient reassessed.  She had another episode of emesis after the Phenergan and Pepcid were given.  Was planning on attempting Benadryl however patient states that she took some before she got here.  For now we will let the remainder of the fluids running and then reevaluate. [AP]      ED Course User Index  [AP] Heinz Knuckles, DO       MDM:  Patient doing  better after fluids.  States he still having some nausea.  At this time will discharge with suppositories and Phenergan which she can use until she  improves and then can use oral Phenergan as needed.  Patient safe for discharge.  Since her symptoms are more than 72 hours out, not a candidate for Tamiflu      CONSULTS:  None    CRITICAL CARE:  There was a high probability of clinically significant/life threatening deterioration in this patient's condition which required my urgent intervention.  Total critical care time was 15 minutes.  This excludes any time for separately reportable procedures.     FINAL IMPRESSION     1. Influenza A          DISPOSITION / PLAN   DISPOSITION Decision To Discharge 02/25/2021 11:49:24 AM      PATIENT REFERRED TO:  [facility]  [address]  [place] [address]  Schedule an appointment as soon as possible for a visit   If symptoms worsen, To establish care    DISCHARGE MEDICATIONS:  Discharge Medication List as of 02/25/2021 11:51 AM        START taking these medications    Details   promethazine (PHENERGAN) 25 MG suppository Place 1 suppository rectally every 6 hours as needed for Nausea WARNING:  May cause drowsiness.  May impair ability to operate vehicles or machinery.  Do not use in combination with alcohol., Disp-20 suppository, R-0Print      promethazine (PHENERGAN) 25 MG tablet Take 1 tablet by mouth every 6 hours as needed for Nausea WARNING:  May cause drowsiness.  May impair ability to operate vehicles or machinery.  Do not use in combination with alcohol., Disp-10 tablet, R-0Print      sulfamethoxazole-trimethoprim (BACTRIM DS) 800-160 MG per tablet Take 1 tablet by mouth 2 times daily for 5 days, Disp-10 tablet, R-0Print             Heinz Knuckles, DO  Emergency Medicine Attending    (Please note that portions of this note were completed with a voice recognition program.  Efforts were made to edit the dictations but occasionally words are mis-transcribed.)          Heinz Knuckles, DO  02/25/21 1812

## 2021-02-25 NOTE — ED Notes (Signed)
Pt discharged to home. F/U with PCP. 3 written prescriptions given. Verbalized understanding     Blinda Leatherwood, RN  02/25/21 [phone]

## 2021-02-26 LAB — CULTURE, URINE: Culture: NO GROWTH

## 2022-02-10 ENCOUNTER — Inpatient Hospital Stay: Payer: Self-pay

## 2022-02-10 ENCOUNTER — Encounter: Payer: Self-pay | Admitting: Emergency Medicine

## 2022-02-10 ENCOUNTER — Observation Stay
Admission: RE | Admit: 2022-02-10 | Discharge: 2022-02-11 | Disposition: A | Discharge status: Home / Self Care | Payer: Self-pay | Source: Ambulatory Visit | Attending: Obstetrics | Admitting: Obstetrics

## 2022-02-10 ENCOUNTER — Other Ambulatory Visit: Payer: Self-pay

## 2022-02-10 ENCOUNTER — Emergency Department
Admission: EM | Admit: 2022-02-10 | Discharge: 2022-02-10 | Disposition: A | Discharge status: Other Acute Inpatient Hospital | Payer: Self-pay | Attending: Emergency Medicine | Admitting: Emergency Medicine

## 2022-02-10 ENCOUNTER — Encounter: Payer: Self-pay | Admitting: Obstetrics and Gynecology

## 2022-02-10 DIAGNOSIS — I10 Essential (primary) hypertension: Secondary | ICD-10-CM | POA: Insufficient documentation

## 2022-02-10 DIAGNOSIS — N920 Excessive and frequent menstruation with regular cycle: Principal | ICD-10-CM | POA: Insufficient documentation

## 2022-02-10 DIAGNOSIS — D649 Anemia, unspecified: Secondary | ICD-10-CM | POA: Insufficient documentation

## 2022-02-10 DIAGNOSIS — D62 Acute posthemorrhagic anemia: Secondary | ICD-10-CM | POA: Insufficient documentation

## 2022-02-10 DIAGNOSIS — M7981 Nontraumatic hematoma of soft tissue: Secondary | ICD-10-CM | POA: Insufficient documentation

## 2022-02-10 DIAGNOSIS — N924 Excessive bleeding in the premenopausal period: Secondary | ICD-10-CM | POA: Diagnosis present

## 2022-02-10 LAB — CBC
HCT: 27.2 % — ABNORMAL LOW (ref 36.0–46.0)
Hemoglobin: 7.6 g/dL — ABNORMAL LOW (ref 12.0–15.0)
MCH: 18.8 pg — ABNORMAL LOW (ref 26.0–34.0)
MCHC: 27.9 g/dL — ABNORMAL LOW (ref 30.0–36.0)
MCV: 67.2 fL — ABNORMAL LOW (ref 80.0–100.0)
Platelets: 304 10*3/uL (ref 150–400)
RBC: 4.05 MIL/uL (ref 3.87–5.11)
RDW: 17.7 % — ABNORMAL HIGH (ref 11.5–15.5)
WBC: 4 10*3/uL (ref 4.0–10.5)
nRBC: 0 % (ref 0.0–0.2)

## 2022-02-10 LAB — BASIC METABOLIC PANEL
Anion gap: 6 (ref 5–15)
BUN: 11 mg/dL (ref 6–20)
CO2: 23 mmol/L (ref 22–32)
Calcium: 9.3 mg/dL (ref 8.9–10.3)
Chloride: 108 mmol/L (ref 98–111)
Creatinine, Ser: 0.75 mg/dL (ref 0.44–1.00)
GFR, Estimated: >60 mL/min (ref >60)
Glucose, Bld: 93 mg/dL (ref 70–99)
Potassium: 3.9 mmol/L (ref 3.5–5.1)
Sodium: 137 mmol/L (ref 135–145)

## 2022-02-10 LAB — PREPARE RBC (CROSSMATCH)

## 2022-02-10 LAB — HEMOGLOBIN AND HEMATOCRIT, BLOOD
HCT: 28.4 % — ABNORMAL LOW (ref 36.0–46.0)
Hemoglobin: 8.5 g/dL — ABNORMAL LOW (ref 12.0–15.0)

## 2022-02-10 LAB — POC URINE PREG, ED: Preg Test, Ur: NEGATIVE

## 2022-02-10 MED ORDER — ONDANSETRON HCL 4 MG/2ML IJ SOLN
4.0000 mg | Freq: Four times a day (QID) | INTRAMUSCULAR | Status: DC | PRN
  Administered 2022-02-10: 4 mg via INTRAVENOUS
  Filled 2022-02-10: qty 2

## 2022-02-10 MED ORDER — MENTHOL 3 MG MT LOZG: 1.0000 | LOZENGE | OROMUCOSAL | Status: DC | PRN

## 2022-02-10 MED ORDER — ONDANSETRON HCL 4 MG PO TABS: 4.0000 mg | ORAL_TABLET | Freq: Four times a day (QID) | ORAL | Status: DC | PRN

## 2022-02-10 MED ORDER — PRENATAL MULTIVITAMIN CH
1.0000 | ORAL_TABLET | Freq: Every day | ORAL | Status: DC
  Administered 2022-02-11: 1 via ORAL
  Filled 2022-02-10: qty 1

## 2022-02-10 MED ORDER — SODIUM CHLORIDE 0.9% FLUSH: 3.0000 mL | INTRAVENOUS | Status: DC | PRN

## 2022-02-10 MED ORDER — IBUPROFEN 800 MG PO TABS: 800.0000 mg | ORAL_TABLET | Freq: Three times a day (TID) | ORAL | Status: DC | PRN

## 2022-02-10 MED ORDER — IBUPROFEN 600 MG PO TABS
600.0000 mg | ORAL_TABLET | Freq: Four times a day (QID) | ORAL | Status: DC | PRN
  Administered 2022-02-10 – 2022-02-11 (×2): 600 mg via ORAL
  Filled 2022-02-10 (×2): qty 1

## 2022-02-10 MED ORDER — SENNOSIDES-DOCUSATE SODIUM 8.6-50 MG PO TABS
1.0000 | ORAL_TABLET | Freq: Every evening | ORAL | Status: DC | PRN
  Administered 2022-02-11: 1 via ORAL
  Filled 2022-02-10: qty 1

## 2022-02-10 MED ORDER — MEDROXYPROGESTERONE ACETATE 10 MG PO TABS: 20.0000 mg | ORAL_TABLET | Freq: Two times a day (BID) | ORAL | Status: DC

## 2022-02-10 MED ORDER — SODIUM CHLORIDE 0.9 % IV SOLN: 10.0000 mL/h | Freq: Once | INTRAVENOUS | Status: DC

## 2022-02-10 MED ORDER — SIMETHICONE 80 MG PO CHEW: 80.0000 mg | CHEWABLE_TABLET | Freq: Four times a day (QID) | ORAL | Status: DC | PRN

## 2022-02-10 MED ORDER — MEDROXYPROGESTERONE ACETATE 10 MG PO TABS
20.0000 mg | ORAL_TABLET | Freq: Two times a day (BID) | ORAL | Status: DC
  Administered 2022-02-10 – 2022-02-11 (×2): 20 mg via ORAL
  Filled 2022-02-10 (×2): qty 2

## 2022-02-10 MED ORDER — SODIUM CHLORIDE 0.9% IV SOLUTION: Freq: Once | INTRAVENOUS | Status: AC

## 2022-02-10 MED ORDER — ONDANSETRON HCL 4 MG PO TABS
4.0000 mg | ORAL_TABLET | Freq: Four times a day (QID) | ORAL | Status: DC | PRN
  Administered 2022-02-11: 4 mg via ORAL
  Filled 2022-02-10: qty 1

## 2022-02-10 MED ORDER — SODIUM CHLORIDE 0.9% FLUSH
3.0000 mL | Freq: Two times a day (BID) | INTRAVENOUS | Status: DC
  Administered 2022-02-10 (×2): 3 mL via INTRAVENOUS

## 2022-02-10 MED ORDER — LACTATED RINGERS IV SOLN: INTRAVENOUS | Status: DC

## 2022-02-10 MED ORDER — ESTROGENS CONJUGATED 25 MG IJ SOLR
25.0000 mg | INTRAMUSCULAR | Status: DC
  Administered 2022-02-10: 25 mg via INTRAVENOUS
  Filled 2022-02-10 (×3): qty 25

## 2022-02-10 MED ORDER — INFLUENZA VAC SPLIT QUAD 0.5 ML IM SUSY: 0.5000 mL | PREFILLED_SYRINGE | INTRAMUSCULAR | Status: DC | PRN

## 2022-02-10 MED ORDER — OXYCODONE HCL 5 MG PO TABS: 5.0000 mg | ORAL_TABLET | ORAL | Status: DC | PRN

## 2022-02-10 MED ORDER — DOCUSATE SODIUM 100 MG PO CAPS: 100.0000 mg | ORAL_CAPSULE | Freq: Two times a day (BID) | ORAL | Status: DC

## 2022-02-10 MED ORDER — ALUM & MAG HYDROXIDE-SIMETH 200-200-20 MG/5ML PO SUSP: 30.0000 mL | ORAL | Status: DC | PRN

## 2022-02-10 MED ORDER — ONDANSETRON HCL 4 MG/2ML IJ SOLN: 4.0000 mg | Freq: Four times a day (QID) | INTRAMUSCULAR | Status: DC | PRN

## 2022-02-10 MED ORDER — SODIUM CHLORIDE 0.9 % IV SOLN
300.0000 mg | Freq: Once | INTRAVENOUS | Status: AC
  Administered 2022-02-10: 300 mg via INTRAVENOUS
  Filled 2022-02-10: qty 15

## 2022-02-10 MED ORDER — SODIUM CHLORIDE 0.9 % IV SOLN: 250.0000 mL | INTRAVENOUS | Status: DC | PRN

## 2022-02-10 NOTE — ED Triage Notes (Signed)
Presents with pain to left upper leg  States pain started couple of days ago w/o injury  Pain is mainly to lateral left upper leg  Ambulates well

## 2022-02-10 NOTE — ED Notes (Signed)
Attempted iv x2 without success

## 2022-02-10 NOTE — ED Provider Notes (Signed)
[facility] Provider Note    Event Date/Time   First MD Initiated Contact with Patient 02/10/22 [phone]     (approximate)  History   Chief Complaint: Leg Pain  HPI  Kristy Reid is a 26 y.o. female with a past medical history of anemia, bipolar, hypertension, presents to the emergency department for 2 complaints.  According to the patient first for the past 2-week she has noticed a small lump to the outside of her left thigh that she wanted evaluated.  Patient currently cannot find the area where the lump is.  Sickly patient states for the past few weeks she has been feeling dizzy at times, states a history of significant anemia due to heavy menstrual cycles, was on birth control to stop the birth control several months ago.  States she has had blood transfusions in the past and wanted to make sure she was not anemic to the point where she needed a blood transfusion.  Physical Exam   Triage Vital Signs: ED Triage Vitals  Enc Vitals Group     BP 02/10/22 0829 128/69     Pulse Rate 02/10/22 0829 98     Resp 02/10/22 0829 18     Temp 02/10/22 0829 98.7 F (37.1 C)     Temp Source 02/10/22 0829 Oral     SpO2 02/10/22 0829 100 %     Weight 02/10/22 0828 119 lb 14.9 oz (54.4 kg)     Height 02/10/22 0828 5\' 2"  (1.575 m)     Head Circumference --      Peak Flow --      Pain Score --      Pain Loc --      Pain Edu? --      Excl. in GC? --     Most recent vital signs: Vitals:   02/10/22 0829  BP: 128/69  Pulse: 98  Resp: 18  Temp: 98.7 F (37.1 C)  SpO2: 100%    General: Awake, no distress.  CV:  Good peripheral perfusion.  Regular rate and rhythm  Resp:  Normal effort.  Equal breath sounds bilaterally.  Abd:  No distention.  Soft, nontender.  No rebound or guarding. Other:  No palpable lump or bump noted to the left external thigh.  Possible resolved hematoma.   ED Results / Procedures / Treatments   MEDICATIONS ORDERED IN ED: Medications -  No data to display   IMPRESSION / MDM / ASSESSMENT AND PLAN / ED COURSE  I reviewed the triage vital signs and the nursing notes.  Patient's presentation is most consistent with acute presentation with potential threat to life or bodily function.  Patient presents emergency department multiple complaints, first off patient states a bump or lump to the external left thigh.  Nothing palpable currently.  Suspect resolved or resolving hematoma.  Not in the area of any of the larger vessels of the leg.  No skin discoloration noted.  Secondly patient states a history of anemia in the past requiring blood transfusions, states she has intermittently been feeling somewhat dizzy over the past several weeks.  We will check a pregnancy test as well as a CBC and a chemistry.  We will continue to closely monitor while awaiting results.  Patient agreeable to plan.  Overall the patient appears very well with a reassuring physical exam and reassuring vital signs.  Labs have resulted showing negative pregnancy test.  Patient is anemic on her CBC with a hemoglobin of  7.6.  Patient states a history of anemia previously due to heavy menstrual cycles.  Patient recently stopped her birth control several months ago.  Patient's MCV is 67 indicating likely iron deficiency as well.  Patient states actively bleeding with occasional clots.  I spoke to Dr. Leafy Ro of OB/GYN who recommends transfusing 1 unit of packed red blood cells given her symptomatic anemia.  Given her active bleeding Dr. Leafy Ro will start the patient on IV estrogen and admit to her service.  CRITICAL CARE Performed by: Harvest Dark   Total critical care time: 30 minutes  Critical care time was exclusive of separately billable procedures and treating other patients.  Critical care was necessary to treat or prevent imminent or life-threatening deterioration.  Critical care was time spent personally by me on the following activities: development of  treatment plan with patient and/or surrogate as well as nursing, discussions with consultants, evaluation of patient's response to treatment, examination of patient, obtaining history from patient or surrogate, ordering and performing treatments and interventions, ordering and review of laboratory studies, ordering and review of radiographic studies, pulse oximetry and re-evaluation of patient's condition.   FINAL CLINICAL IMPRESSION(S) / ED DIAGNOSES   Hematoma Symptomatic anemia   Note:  This document was prepared using Dragon voice recognition software and may include unintentional dictation errors.   Harvest Dark, MD 02/10/22 1100

## 2022-02-10 NOTE — ED Notes (Signed)
Pt discharged from ED to [facility] to continue care.  Report given by Marisue Ivan, RN to [place], RN.  Accepting MD Dr. Dalbert Garnet.

## 2022-02-10 NOTE — H&P (Signed)
Consult History and Physical   SERVICE: Gynecology   Patient Name: Kristy Reid Patient MRN:   [id number]  CC: feeling dizzy and heavy menstrual cycle   HPI: Kristy Reid is a 26 y.o. [phone] with symptomatic anemia following heavy menstrual cycle. She reports a long history of anemia that has required a blood transfusion in the past.  She also required IV iron transfusion for management of iron deficiency anemia.  States her cycles have been normal the past couple of years.  Occurs monthly, lasts 5-7 days, flow is moderate and sometimes heavy.  She denies symptoms of anemia prior to the bleeding episode that started 2 days ago.  She is not on any hormonal contraception currently and not sexually active. Her past medical history is significant for previous DVT > 5 years ago and hypertension.    Review of Systems: positives in bold Review of Systems  Constitutional:  Negative for chills, fever and malaise/fatigue.  Eyes:  Negative for blurred vision.  Respiratory:  Negative for cough and sputum production.   Cardiovascular:  Negative for chest pain.  Gastrointestinal:  Negative for heartburn, nausea and vomiting.  Genitourinary:  Negative for dysuria, frequency and urgency.       Abnormal heavy menstrual cycle that started 2 days ago  Musculoskeletal:  Negative for back pain and myalgias.  Neurological:  Positive for dizziness. Negative for headaches.  Psychiatric/Behavioral:  Negative for depression. The patient is not nervous/anxious.      Past Obstetrical History: OB History     Gravida  5   Para  2   Term  2   Preterm  0   AB  1   Living  2      SAB  1   IAB  0   Ectopic  0   Multiple  0   Live Births  2           Past Gynecologic History: No LMP recorded. Menstrual frequency Q 28-30 days lasting 5-7 days requiring, moderate to heavy flow.   Past Medical History: Past Medical History:  Diagnosis Date   Anemia    Anemia    Anxiety     Asthma    Bipolar 1 disorder ([place])    Chronic back pain    Depression    Hypertension    Insomnia    Personality disorder ([place])    "boarderline"   PTSD (post-traumatic stress disorder)     Past Surgical History:   Past Surgical History:  Procedure Laterality Date   BUNIONECTOMY Bilateral    feet   wrist sugery      Family History:  family history includes Diabetes in her maternal grandmother and mother; Heart disease in her maternal grandmother; Hypertension in her mother; Mental illness in her mother.  Social History:  Social History   Socioeconomic History   Marital status: Single    Spouse name: Not on file   Number of children: 2   Years of education: Not on file   Highest education level: Not on file  Occupational History   Not on file  Tobacco Use   Smoking status: Former    Types: Cigarettes    Quit date: 08/13/2015    Years since quitting: 6.5   Smokeless tobacco: Never  Vaping Use   Vaping Use: Never used  Substance and Sexual Activity   Alcohol use: No   Drug use: Yes    Frequency: 7.0 times per week    Types: Marijuana  Comment: daily use, last use today 08/31/2019   Sexual activity: Yes    Comment: undecided  Other Topics Concern   Not on file  Social History Narrative   Not on file   Social Determinants of Health   Financial Resource Strain: Low Risk  (05/23/2018)   Overall Financial Resource Strain (CARDIA)    Difficulty of Paying Living Expenses: Not hard at all  Food Insecurity: No Food Insecurity (05/23/2018)   Hunger Vital Sign    Worried About Running Out of Food in the Last Year: Never true    Ran Out of Food in the Last Year: Never true  Transportation Needs: No Transportation Needs (05/23/2018)   PRAPARE - Administrator, Civil Service (Medical): No    Lack of Transportation (Non-Medical): No  Physical Activity: Insufficiently Active (05/23/2018)   Exercise Vital Sign    Days of Exercise per Week: 3 days    Minutes of  Exercise per Session: 30 min  Stress: Stress Concern Present (05/23/2018)   [employer] of Occupational Health - Occupational Stress Questionnaire    Feeling of Stress : Rather much  Social Connections: Somewhat Isolated (05/23/2018)   Social Connection and Isolation Panel [NHANES]    Frequency of Communication with Friends and Family: More than three times a week    Frequency of Social Gatherings with Friends and Family: More than three times a week    Attends Religious Services: Never    Database administrator or Organizations: No    Attends Banker Meetings: Never    Marital Status: Living with partner  Intimate Partner Violence: Not on file    Home Medications:  Medications reconciled in EPIC  No current facility-administered medications on file prior to encounter.   Current Outpatient Medications on File Prior to Encounter  Medication Sig Dispense Refill   acetaminophen (TYLENOL) 500 MG tablet Take 500-1,000 mg by mouth every 6 (six) hours as needed for mild pain or moderate pain.     albuterol (VENTOLIN HFA) 108 (90 Base) MCG/ACT inhaler Inhale 2 puffs into the lungs every 6 (six) hours as needed for wheezing or shortness of breath.     fexofenadine-pseudoephedrine (ALLEGRA-D) 60-120 MG 12 hr tablet Take 1 tablet by mouth 2 (two) times daily. 20 tablet 0   fluticasone (FLONASE) 50 MCG/ACT nasal spray Place 1 spray into both nostrils daily.     hydrOXYzine (ATARAX/VISTARIL) 10 MG tablet Take 10 mg by mouth 3 (three) times daily as needed for itching or anxiety. (Patient not taking: Reported on 02/10/2022)     meclizine (ANTIVERT) 25 MG tablet Take 25 mg by mouth 3 (three) times daily as needed for dizziness. (Patient not taking: Reported on 02/10/2022)     metoprolol tartrate (LOPRESSOR) 50 MG tablet Take 1.5 tablets (75 mg total) by mouth 2 (two) times daily. 90 tablet 5   QUEtiapine (SEROQUEL) 300 MG tablet Take 300 mg by mouth at bedtime.  (Patient not taking:  Reported on 02/10/2022)     traZODone (DESYREL) 150 MG tablet Take 150 mg by mouth at bedtime.  (Patient not taking: Reported on 02/10/2022)     [DISCONTINUED] loratadine (CLARITIN) 10 MG tablet Take 1 tablet (10 mg total) by mouth daily. 30 tablet 1    Allergies:  Allergies  Allergen Reactions   Penicillins Hives    Has patient had a PCN reaction causing immediate rash, facial/tongue/throat swelling, SOB or lightheadedness with hypotension: No Has patient had a PCN reaction causing  severe rash involving mucus membranes or skin necrosis: No Has patient had a PCN reaction that required hospitalization No Has patient had a PCN reaction occurring within the last 10 years: No If all of the above answers are "NO", then may proceed with Cephalosporin use.   Rocephin [Ceftriaxone] Hives   Zithromax [Azithromycin] Itching and Nausea And Vomiting   Lidocaine Hives, Itching and Rash    Physical Exam:  Temp:  [98.7 F (37.1 C)] 98.7 F (37.1 C) (11/28 1345) Pulse Rate:  [98] 98 (11/28 1345) Resp:  [18] 18 (11/28 1345) BP: (128)/(69) 128/69 (11/28 1345) SpO2:  [100 %] 100 % (11/28 1345) Weight:  [54.4 kg] 54.4 kg (11/28 0828)   Physical Exam Constitutional:      Appearance: Normal appearance.  Cardiovascular:     Rate and Rhythm: Normal rate.  Pulmonary:     Effort: Pulmonary effort is normal.  Abdominal:     Palpations: Abdomen is soft.     Tenderness: There is no abdominal tenderness.  Genitourinary:    Comments: deferred Musculoskeletal:        General: Normal range of motion.     Cervical back: Normal range of motion.  Skin:    General: Skin is warm and dry.     Capillary Refill: Capillary refill takes less than 2 seconds.  Neurological:     Mental Status: She is alert and oriented to person, place, and time.  Psychiatric:        Mood and Affect: Mood normal.       Labs/Studies:   CBC and Coags:  Lab Results  Component Value Date   WBC 4.0 02/10/2022    NEUTOPHILPCT 43 08/04/2019   EOSPCT 4 08/04/2019   BASOPCT 1 08/04/2019   LYMPHOPCT 45 08/04/2019   HGB 7.6 (L) 02/10/2022   HCT 27.2 (L) 02/10/2022   MCV 67.2 (L) 02/10/2022   PLT 304 02/10/2022   CMP:  Lab Results  Component Value Date   NA 137 02/10/2022   K 3.9 02/10/2022   CL 108 02/10/2022   CO2 23 02/10/2022   BUN 11 02/10/2022   CREATININE 0.75 02/10/2022   CREATININE 0.82 08/27/2019   CREATININE 0.86 08/04/2019   PROT 7.1 12/23/2018   BILITOT 0.5 12/23/2018   ALT 22 12/23/2018   AST 22 12/23/2018   ALKPHOS 70 12/23/2018     TVUS:  ordered  Other Imaging: No results found.   Assessment / Plan:   Kristy Reid is a 26 y.o. [phone] who presents with symptomatic anemia secondary to menorrhagia  Symptomatic Anemia -Plan of care discussed with Dr. Leafy Ro  -Observation to [facility]  -1 pRBC ordered to transfuse -will follow with IV iron transfusion -VS per protocol -Activity as tolerated  -Regular diet   Menorrhagia -1 dose of IV estrogen given -Will start Provera 20 mg PO BID  -TVUS ordered    Thank you for the opportunity to be involved with this patient's care.  ----- Drinda Butts, CNM Certified Nurse Midwife [facility], Department of [facility]

## 2022-02-11 LAB — TYPE AND SCREEN
ABO/RH(D): O POS
Antibody Screen: NEGATIVE
Unit division: 0

## 2022-02-11 LAB — CBC
HCT: 29.7 % — ABNORMAL LOW (ref 36.0–46.0)
Hemoglobin: 8.7 g/dL — ABNORMAL LOW (ref 12.0–15.0)
MCH: 20.8 pg — ABNORMAL LOW (ref 26.0–34.0)
MCHC: 29.3 g/dL — ABNORMAL LOW (ref 30.0–36.0)
MCV: 70.9 fL — ABNORMAL LOW (ref 80.0–100.0)
Platelets: 272 10*3/uL (ref 150–400)
RBC: 4.19 MIL/uL (ref 3.87–5.11)
RDW: 18.9 % — ABNORMAL HIGH (ref 11.5–15.5)
WBC: 6.8 10*3/uL (ref 4.0–10.5)
nRBC: 0 % (ref 0.0–0.2)

## 2022-02-11 LAB — BPAM RBC
Blood Product Expiration Date: 202312042359
ISSUE DATE / TIME: 202311281612
Unit Type and Rh: 5100

## 2022-02-11 MED ORDER — MEDROXYPROGESTERONE ACETATE 10 MG PO TABS: 20.0000 mg | ORAL_TABLET | Freq: Two times a day (BID) | ORAL | 0 refills | Status: DC

## 2022-02-11 MED ORDER — MELATONIN 5 MG PO TABS
5.0000 mg | ORAL_TABLET | Freq: Every day | ORAL | Status: DC
  Administered 2022-02-11: 5 mg via ORAL
  Filled 2022-02-11: qty 1

## 2022-02-11 MED ORDER — SENNOSIDES-DOCUSATE SODIUM 8.6-50 MG PO TABS: 1.0000 | ORAL_TABLET | Freq: Every evening | ORAL | Status: DC | PRN

## 2022-02-11 MED ORDER — IBUPROFEN 600 MG PO TABS: 600.0000 mg | ORAL_TABLET | Freq: Four times a day (QID) | ORAL | 0 refills | Status: DC | PRN

## 2022-02-11 NOTE — Discharge Summary (Signed)
Obstetric and Gynecology  Subjective  Kristy Reid is a 26 y.o. female [id number] who presented on 02/10/2022 for menorrhagia with symptomatic anemia.      Objective   Vitals:   02/11/22 0400 02/11/22 1204  BP: (!) 102/56 115/60  Pulse: (!) 58 92  Resp: 20 18  Temp: 97.9 F (36.6 C) 98.6 F (37 C)  SpO2: 100% 100%     Intake/Output Summary (Last 24 hours) at 02/11/2022 1333 Last data filed at 02/11/2022 1100 Gross per 24 hour  Intake 994.49 ml  Output 450 ml  Net 544.49 ml    General: NAD Cardiovascular: RRR, no murmurs Pulmonary: CTAB Abdomen: Benign. Non-tender, +BS, no guarding. Extremities: No erythema or cords, no calf tenderness, +warmth with normal peripheral pulses.  Labs: Results for orders placed or performed during the hospital encounter of 02/10/22 (from the past 24 hour(s))  Prepare RBC (crossmatch)     Status: None   Collection Time: 02/10/22  2:24 PM  Result Value Ref Range   Order Confirmation      DUPLICATE REQUEST Performed at [facility], [address] [place], [place] [address]   Hemoglobin and hematocrit, blood     Status: Abnormal   Collection Time: 02/10/22 11:06 PM  Result Value Ref Range   Hemoglobin 8.5 (L) 12.0 - 15.0 g/dL   HCT 28.4 (L) 36.0 - 46.0 %  CBC     Status: Abnormal   Collection Time: 02/11/22  6:38 AM  Result Value Ref Range   WBC 6.8 4.0 - 10.5 K/uL   RBC 4.19 3.87 - 5.11 MIL/uL   Hemoglobin 8.7 (L) 12.0 - 15.0 g/dL   HCT 29.7 (L) 36.0 - 46.0 %   MCV 70.9 (L) 80.0 - 100.0 fL   MCH 20.8 (L) 26.0 - 34.0 pg   MCHC 29.3 (L) 30.0 - 36.0 g/dL   RDW 18.9 (H) 11.5 - 15.5 %   Platelets 272 150 - 400 K/uL   nRBC 0.0 0.0 - 0.2 %    Cultures: Results for orders placed or performed during the hospital encounter of 12/30/18  Novel Coronavirus, NAA (Hosp order, Send-out to Ref Lab; TAT 18-24 hrs     Status: Abnormal   Collection Time: 12/30/18 11:17 PM   Specimen: Nasopharyngeal Swab; Respiratory  Result  Value Ref Range Status   SARS-CoV-2, NAA DETECTED (A) NOT DETECTED Final    Comment: RESULT CALLED TO, READ BACK BY AND VERIFIED WITH: LIZ GANNON 01/02/2019 1326 YER (NOTE)                  Client Requested Flag This nucleic acid amplification test was developed and its performance characteristics determined by [employer]. Nucleic acid amplification tests include PCR and TMA. This test has not been FDA cleared or approved. This test has been authorized by FDA under an Emergency Use Authorization (EUA). This test is only authorized for the duration of time the declaration that circumstances exist justifying the authorization of the emergency use of in vitro diagnostic tests for detection of SARS-CoV-2 virus and/or diagnosis of COVID-19 infection under section 564(b)(1) of the Act, 21 U.S.C. [id number]) (1), unless the authorization is terminated or revoked sooner. When diagnostic testing is negative, the possibility of a false negative result should be considered in the context of a patient's recent exposures and the presence of clinical signs  and symptoms consistent with COVID-19. An individual without symptoms of COVID- 19 and who is not shedding SARS-CoV-2 virus would expect to  have a negative (not detected) result in this assay. Performed At: [facility] [address] [place], [place] [id number] Jolene Schimke MD TJ:[id number]    Coronavirus Source NASOPHARYNGEAL  Final    Comment: Performed at [facility] [address] [place], [place] [address]    Imaging: US PELVIS (TRANSABDOMINAL ONLY)  Result Date: 02/10/2022 CLINICAL DATA:  Menorrhagia with regular cycle EXAM: TRANSABDOMINAL ULTRASOUND OF PELVIS TECHNIQUE: Transabdominal ultrasound examination of the pelvis was performed including evaluation of the uterus, ovaries, adnexal regions, and pelvic cul-de-sac. COMPARISON:  None Available. FINDINGS: Uterus Measurements: 8.4 x 3.8 x 4.4 cm =  volume: 73.1 mL. No fibroids or other mass visualized. Endometrium Thickness: 6.1 mm.  No focal abnormality visualized. Right ovary Measurements: 2.1 x 1.8 x 1.6 cm = volume: 3.1 mL. Normal appearance/no adnexal mass. Left ovary Measurements: 2.3 x 1.2 x 3.0 cm = volume: 4.4 mL. Normal appearance/no adnexal mass. Other findings:  No abnormal free fluid. IMPRESSION: Normal transabdominal pelvic ultrasound. Electronically Signed   By: Caprice Renshaw M.D.   On: 02/10/2022 14:55     Assessment   26 y.o. [id number] Hospital Day: 2   Plan   1. Provera 20 mg BID x 10 days 2. Preop appt. With Dr Dalbert Garnet in 10 days  Chari Manning, CNM Certified Nurse Midwife [facility]

## 2022-02-11 NOTE — Progress Notes (Signed)
Pt discharged home.  Discharge instructions, prescriptions and follow up appointment given to and reviewed with pt.  Pt verbalized understanding.  Escorted by auxillary. 

## 2022-02-17 ENCOUNTER — Emergency Department
Admission: EM | Admit: 2022-02-17 | Discharge: 2022-02-17 | Disposition: A | Discharge status: Home / Self Care | Payer: Self-pay | Attending: Emergency Medicine | Admitting: Emergency Medicine

## 2022-02-17 ENCOUNTER — Encounter: Payer: Self-pay | Admitting: Emergency Medicine

## 2022-02-17 ENCOUNTER — Other Ambulatory Visit: Payer: Self-pay

## 2022-02-17 DIAGNOSIS — D649 Anemia, unspecified: Secondary | ICD-10-CM | POA: Insufficient documentation

## 2022-02-17 DIAGNOSIS — N939 Abnormal uterine and vaginal bleeding, unspecified: Secondary | ICD-10-CM | POA: Insufficient documentation

## 2022-02-17 LAB — CBC
HCT: 35.3 % — ABNORMAL LOW (ref 36.0–46.0)
Hemoglobin: 10.3 g/dL — ABNORMAL LOW (ref 12.0–15.0)
MCH: 21.9 pg — ABNORMAL LOW (ref 26.0–34.0)
MCHC: 29.2 g/dL — ABNORMAL LOW (ref 30.0–36.0)
MCV: 75.1 fL — ABNORMAL LOW (ref 80.0–100.0)
Platelets: 286 10*3/uL (ref 150–400)
RBC: 4.7 MIL/uL (ref 3.87–5.11)
RDW: 24.8 % — ABNORMAL HIGH (ref 11.5–15.5)
WBC: 5 10*3/uL (ref 4.0–10.5)
nRBC: 0 % (ref 0.0–0.2)

## 2022-02-17 NOTE — ED Notes (Signed)
See triage note  Presents with some vaginal bleeding  States she started bleeding about 4 days ago  now bleeding  is worse  and passing clots

## 2022-02-17 NOTE — ED Notes (Signed)
Pt in bed, pt states that she is ready to go home, pt verbalized d/c instructions and follow up, signature pad doesn't work so pt verbalized understanding d/c instructions., pt ambulatory from dpt.

## 2022-02-17 NOTE — ED Provider Notes (Addendum)
   [facility] Provider Note    Event Date/Time   First MD Initiated Contact with Patient 02/17/22 [phone]     (approximate)   History   Vaginal Bleeding   HPI  Kristy Reid is a 26 y.o. female who presents with complaints of vaginal bleeding.  Patient was recently hospitalized for menorrhagia, has outpatient follow-up with Dr. Dalbert Garnet for preop for hysterectomy.  She reports she been doing well since discharge but today started bleeding again.  Concerned because she had some blood clots.  Felt slightly fatigued and is concerned her hemoglobin is low.     Physical Exam   Triage Vital Signs: ED Triage Vitals  Enc Vitals Group     BP 02/17/22 0931 118/62     Pulse Rate 02/17/22 0931 88     Resp 02/17/22 0931 18     Temp 02/17/22 0931 98.6 F (37 C)     Temp Source 02/17/22 0931 Oral     SpO2 02/17/22 0931 100 %     Weight 02/17/22 0917 59 kg (130 lb 1.1 oz)     Height 02/17/22 0917 1.575 m (5\' 2" )     Head Circumference --      Peak Flow --      Pain Score 02/17/22 0917 10     Pain Loc --      Pain Edu? --      Excl. in [place]? --     Most recent vital signs: Vitals:   02/17/22 0931 02/17/22 1022  BP: 118/62 127/77  Pulse: 88 75  Resp: 18 17  Temp: 98.6 F (37 C) 98.4 F (36.9 C)  SpO2: 100% 100%     General: Awake, no distress.  CV:  Good peripheral perfusion.  Resp:  Normal effort.  Abd:  No distention.  Other:     ED Results / Procedures / Treatments   Labs (all labs ordered are listed, but only abnormal results are displayed) Labs Reviewed  CBC - Abnormal; Notable for the following components:      Result Value   Hemoglobin 10.3 (*)    HCT 35.3 (*)    MCV 75.1 (*)    MCH 21.9 (*)    MCHC 29.2 (*)    RDW 24.8 (*)    All other components within normal limits     EKG     RADIOLOGY     PROCEDURES:  Critical Care performed:   Procedures   MEDICATIONS ORDERED IN ED: Medications - No data to  display   IMPRESSION / MDM / ASSESSMENT AND PLAN / ED COURSE  I reviewed the triage vital signs and the nursing notes. Patient's presentation is most consistent with acute complicated illness / injury requiring diagnostic workup.   Patient with a history of menorrhagia with symptomatic anemia.  Started having vaginal bleeding again today.  Hemoglobin checked, 10.3 significantly improved from 8.7 on 1129 per medical records.  Patient is relieved, has outpatient follow with Dr. 1130 next week.  No indication for admission     FINAL CLINICAL IMPRESSION(S) / ED DIAGNOSES   Final diagnoses:  Vaginal bleeding     Rx / DC Orders   ED Discharge Orders     None        Note:  This document was prepared using Dragon voice recognition software and may include unintentional dictation errors.   Dalbert Garnet, MD 02/17/22 1304    14/05/23, MD 02/17/22 [phone]

## 2022-02-17 NOTE — ED Triage Notes (Signed)
Pt here with vaginal bleeding. Pt states she was admitted recently for same and was given a medication to stop her bleeding. Pt states she she started bleeding again and passing clots. Pt denies pain but states she has been cramping. Pt ambulatory to triage.

## 2022-02-20 NOTE — H&P (Signed)
See H&P by Margaretmary Eddy, CNM

## 2022-03-24 ENCOUNTER — Emergency Department
Admission: EM | Admit: 2022-03-24 | Discharge: 2022-03-24 | Disposition: A | Discharge status: Home / Self Care | Payer: Self-pay | Attending: Emergency Medicine | Admitting: Emergency Medicine

## 2022-03-24 ENCOUNTER — Other Ambulatory Visit: Payer: Self-pay

## 2022-03-24 ENCOUNTER — Encounter: Payer: Self-pay | Admitting: Emergency Medicine

## 2022-03-24 DIAGNOSIS — Z1152 Encounter for screening for COVID-19: Secondary | ICD-10-CM | POA: Insufficient documentation

## 2022-03-24 DIAGNOSIS — J111 Influenza due to unidentified influenza virus with other respiratory manifestations: Secondary | ICD-10-CM | POA: Insufficient documentation

## 2022-03-24 LAB — RESP PANEL BY RT-PCR (RSV, FLU A&B, COVID)  RVPGX2
Influenza A by PCR: NEGATIVE
Influenza B by PCR: POSITIVE — AB
Resp Syncytial Virus by PCR: NEGATIVE
SARS Coronavirus 2 by RT PCR: NEGATIVE

## 2022-03-24 MED ORDER — IBUPROFEN 400 MG PO TABS
600.0000 mg | ORAL_TABLET | Freq: Once | ORAL | Status: AC
  Administered 2022-03-24: 600 mg via ORAL
  Filled 2022-03-24: qty 2

## 2022-03-24 MED ORDER — ACETAMINOPHEN 500 MG PO TABS
1000.0000 mg | ORAL_TABLET | Freq: Once | ORAL | Status: AC
  Administered 2022-03-24: 1000 mg via ORAL
  Filled 2022-03-24: qty 2

## 2022-03-24 NOTE — ED Triage Notes (Signed)
Patient to ED via POV flu like symptoms. Paitent c/o chills, body aches, stuff nose, and non-productive cough. Symptoms started yesterday. NAD noted in triage.

## 2022-03-24 NOTE — Discharge Instructions (Addendum)
Take OTC Tylenol Motrin as needed for fevers and chills.  Continue to hydrate and rest as necessary.

## 2022-03-24 NOTE — ED Provider Notes (Signed)
[facility] Emergency Department Provider Note     Event Date/Time   First MD Initiated Contact with Patient 03/24/22 1303     (approximate)   History   Chills   HPI  Kristy Reid is a 27 y.o. female with multiple medical conditions, presents to the ED with onset of flulike symptoms yesterday.  She endorses chills, body aches, stuffy nose, and a nonproductive cough.  She denies any nausea, vomiting, diarrhea.     Physical Exam   Triage Vital Signs: ED Triage Vitals  Enc Vitals Group     BP 03/24/22 1029 113/76     Pulse Rate 03/24/22 1029 (!) 103     Resp 03/24/22 1029 16     Temp 03/24/22 1029 98.9 F (37.2 C)     Temp Source 03/24/22 1029 Oral     SpO2 03/24/22 1029 99 %     Weight 03/24/22 1020 140 lb (63.5 kg)     Height 03/24/22 1020 5\' 2"  (1.575 m)     Head Circumference --      Peak Flow --      Pain Score 03/24/22 1020 7     Pain Loc --      Pain Edu? --      Excl. in [place]? --     Most recent vital signs: Vitals:   03/24/22 1029  BP: 113/76  Pulse: (!) 103  Resp: 16  Temp: 98.9 F (37.2 C)  SpO2: 99%    General Awake, no distress.  HEENT NCAT. PERRL. EOMI. No rhinorrhea. Mucous membranes are moist.  CV:  Good peripheral perfusion.  RESP:  Normal effort.  ABD:  No distention.    ED Results / Procedures / Treatments   Labs (all labs ordered are listed, but only abnormal results are displayed) Labs Reviewed  RESP PANEL BY RT-PCR (RSV, FLU A&B, COVID)  [id number] - Abnormal; Notable for the following components:      Result Value   Influenza B by PCR POSITIVE (*)    All other components within normal limits     EKG   RADIOLOGY   PROCEDURES:  Critical Care performed: No  Procedures   MEDICATIONS ORDERED IN ED: Medications  acetaminophen (TYLENOL) tablet 1,000 mg (1,000 mg Oral Given 03/24/22 1322)  ibuprofen (ADVIL) tablet 600 mg (600 mg Oral Given 03/24/22 1322)     IMPRESSION / MDM / ASSESSMENT AND  PLAN / ED COURSE  I reviewed the triage vital signs and the nursing notes.                              Differential diagnosis includes, but is not limited to,, flu, RSV, strep pharyngitis, viral URI  Patient's presentation is most consistent with acute complicated illness / injury requiring diagnostic workup.  Patient to the ED for evaluation of flulike symptoms.  Patient is evaluated for complaints in the ED, found of a reassuring exam and workup overall.  No signs of acute respiratory stress or dehydration.  Patient's viral panel screen does confirm influenza B.  Patient's diagnosis is consistent with flu [place]. Patient will be discharged home with directions to take OTC medications for symptom management.  Patient was offered, but declined Tamiflu at this time.  Patient is to follow up with her PCP as needed or otherwise directed. Patient is given ED precautions to return to the ED for any worsening or new symptoms.  FINAL CLINICAL IMPRESSION(S) / ED DIAGNOSES   Final diagnoses:  Flu     Rx / DC Orders   ED Discharge Orders     None        Note:  This document was prepared using Dragon voice recognition software and may include unintentional dictation errors.    Lissa Hoard, PA-C 03/24/22 1423    Pilar Jarvis, MD 03/24/22 [phone]

## 2022-03-24 NOTE — ED Notes (Addendum)
Pt reports body aches, cough, chest discomfort from cough, slight SOB upon exertion that started yesterday afternoon; pt's resp reg/unlabored, skin dry, and sitting calmly in recliner currently. Given another warm blanket as requested.

## 2022-03-24 NOTE — ED Notes (Signed)
Pt updated

## 2022-05-17 ENCOUNTER — Other Ambulatory Visit: Payer: Self-pay

## 2022-05-17 ENCOUNTER — Emergency Department
Admission: EM | Admit: 2022-05-17 | Discharge: 2022-05-17 | Disposition: A | Discharge status: Home / Self Care | Payer: Medicaid Other | Attending: Emergency Medicine | Admitting: Emergency Medicine

## 2022-05-17 ENCOUNTER — Encounter: Payer: Self-pay | Admitting: Emergency Medicine

## 2022-05-17 DIAGNOSIS — M5416 Radiculopathy, lumbar region: Secondary | ICD-10-CM | POA: Insufficient documentation

## 2022-05-17 DIAGNOSIS — F17200 Nicotine dependence, unspecified, uncomplicated: Secondary | ICD-10-CM | POA: Insufficient documentation

## 2022-05-17 DIAGNOSIS — M545 Low back pain, unspecified: Secondary | ICD-10-CM | POA: Diagnosis present

## 2022-05-17 DIAGNOSIS — J45909 Unspecified asthma, uncomplicated: Secondary | ICD-10-CM | POA: Diagnosis not present

## 2022-05-17 MED ORDER — KETOROLAC TROMETHAMINE 15 MG/ML IJ SOLN
15.0000 mg | Freq: Once | INTRAMUSCULAR | Status: AC
  Administered 2022-05-17: 15 mg via INTRAMUSCULAR
  Filled 2022-05-17: qty 1

## 2022-05-17 MED ORDER — PREDNISONE 10 MG (21) PO TBPK: ORAL_TABLET | ORAL | 0 refills | Status: DC

## 2022-05-17 MED ORDER — NAPROXEN 500 MG PO TABS: 500.0000 mg | ORAL_TABLET | Freq: Two times a day (BID) | ORAL | 0 refills | Status: AC

## 2022-05-17 MED ORDER — ACETAMINOPHEN 325 MG PO TABS
650.0000 mg | ORAL_TABLET | Freq: Once | ORAL | Status: AC
  Administered 2022-05-17: 650 mg via ORAL
  Filled 2022-05-17: qty 2

## 2022-05-17 NOTE — Discharge Instructions (Addendum)
Please return to the emergency department for any new, worsening, or changing symptoms or other concerns including weakness in your legs, urinary or stool incontinence or retention, numbness or tingling in your extremities/buttocks/groin, fevers, or any other concerns or change in symptoms.

## 2022-05-17 NOTE — ED Notes (Signed)
Pt bent over at work early this AM and then had sudden lower back pain, sharp and shooting down R leg. Pt still has 7/10 pain. Ambulatory to treatment room.

## 2022-05-17 NOTE — ED Provider Notes (Signed)
[facility] Provider Note    Event Date/Time   First MD Initiated Contact with Patient 05/17/22 1248     (approximate)   History   Back Pain   HPI  Kristy Reid is a 27 y.o. female who presents today for evaluation of low back pain with radiation down her right leg.  Patient reports that she leaned over to pick something up and felt a sharp pain in her low back.  She reports that she has sciatica of her right leg and this feels very similar.  She reports that the pain goes down to her mid lower leg.  She denies weakness in her leg.  No fevers or chills.  No urinary or fecal incontinence or retention.  No saddle anesthesia.  No abdominal pain or chest pain.   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   MDD (major depressive disorder), recurrent, severe, with psychosis ([place]) 07/17/2016   Decreased fetal movement 03/22/2016   Abdominal pain in pregnancy 01/21/2016   Constipation during pregnancy in second trimester 12/26/2015   Overdose 11/29/2015   Supervision of high risk pregnancy, antepartum 11/15/2015   Cannabis use disorder, severe, dependence ([place]) 11/15/2015   Tobacco use disorder 11/15/2015   Asthma 11/15/2015   Borderline personality disorder ([place]) 11/15/2015   PTSD (post-traumatic stress disorder) 11/15/2015   Severe recurrent major depression without psychotic features ([place]) 11/13/2015          Physical Exam   Triage  Vital Signs: ED Triage Vitals  Enc Vitals Group     BP 05/17/22 1243 122/74     Pulse Rate 05/17/22 1243 (!) 110     Resp 05/17/22 1243 20     Temp 05/17/22 1243 98.4 F (36.9 C)     Temp Source 05/17/22 1243 Oral     SpO2 05/17/22 1243 97 %     Weight 05/17/22 1243 150 lb (68 kg)     Height 05/17/22 1242 '5\' 2"'$  (1.575 m)     Head Circumference --      Peak Flow --      Pain Score 05/17/22 1242 8     Pain Loc --      Pain Edu? --      Excl. in [place]? --     Most recent vital signs: Vitals:   05/17/22 1243  BP: 122/74  Pulse: (!) 110  Resp: 20  Temp: 98.4 F (36.9 C)  SpO2: 97%    Physical Exam Vitals and nursing note reviewed.  Constitutional:      General: Awake and alert. No acute distress.    Appearance: Normal appearance. The patient is normal weight.  HENT:     Head: Normocephalic and atraumatic.     Mouth: Mucous membranes are moist.  Eyes:     General: PERRL. Normal EOMs        Right eye: No discharge.        Left eye: No discharge.     Conjunctiva/sclera: Conjunctivae normal.  Cardiovascular:  Rate and Rhythm: Normal rate and regular rhythm.     Pulses: Normal pulses.  Pulmonary:     Effort: Pulmonary effort is normal. No respiratory distress.     Breath sounds: Normal breath sounds.  Abdominal:     Abdomen is soft. There is no abdominal tenderness. No rebound or guarding. No distention. Back: Diffuse lumbar paraspinal tenderness.  Strength and sensation 5/5 to bilateral lower extremities. Normal great toe extension against resistance. Normal sensation throughout feet. Normal patellar reflexes. Positive SLR and negative opposite SLR bilaterally.  Musculoskeletal:        General: No swelling. Normal range of motion.     Cervical back: Normal range of motion and neck supple.  Skin:    General: Skin is warm and dry.     Capillary Refill: Capillary refill takes less than 2 seconds.     Findings: No rash.  Neurological:     Mental Status: The patient is  awake and alert.      ED Results / Procedures / Treatments   Labs (all labs ordered are listed, but only abnormal results are displayed) Labs Reviewed - No data to display   EKG     RADIOLOGY     PROCEDURES:  Critical Care performed:   Procedures   MEDICATIONS ORDERED IN ED: Medications  ketorolac (TORADOL) 15 MG/ML injection 15 mg (15 mg Intramuscular Given 05/17/22 1315)  acetaminophen (TYLENOL) tablet 650 mg (650 mg Oral Given 05/17/22 1315)     IMPRESSION / MDM / ASSESSMENT AND PLAN / ED COURSE  I reviewed the triage vital signs and the nursing notes.   Differential diagnosis includes, but is not limited to, back spasm, muscle injury, sciatica, lumbar radiculopathy.  Patient is awake and alert, nontoxic-appearing and afebrile.  She has a history of back pain and presents with back pain.  She has 5 out of 5 strength with intact sensation to extensor hallucis dorsiflexion and plantarflexion of bilateral lower extremities with normal patellar reflexes bilaterally. Most likely etiology at this point is muscle strain vs herniated disc. No red flags to indicate patient is at risk for more auspicious process that would require urgent/emergent spinal imaging or subspecialty evaluation at this time. No major trauma, no midline tenderness, no history or physical exam findings to suggest cauda equina syndrome or spinal cord compression. No focal neurological deficits on exam. No constitutional symptoms or history of immunosuppression or IVDA to suggest potential for epidural abscess. Not anticoagulated, no history of bleeding diastasis to suggest risk for epidural hematoma. No chronic steroid use or advanced age or history of malignancy to suggest proclivity towards pathological fracture.  No abdominal pain or flank pain to suggest kidney stone, no history of kidney stone.  No fever or dysuria or CVAT to suggest pyelonephritis .  No chest pain, back pain, shortness of breath,  neurological deficits, to suggest vascular catastrophe, and pulses are equal in all 4 extremities.  Discussed care instructions and return precautions with patient. Recommended close outpatient follow-up for re-evaluation. Patient agrees with plan of care. Will treat the patient symptomatically as needed for pain control.  I advised pregnancy test prior to medication administration, though she reports that there is absolutely no chance of pregnancy and she declines giving a pregnancy test.  She understands that these medications are harmful if she is indeed pregnant and she still reports that there is no chance that she is pregnant and does not want to give a pregnancy test.  Will discharge patient to  take these medications and return for any worsening or different pain or development of any neurologic symptoms. Educated patient regarding expected time course for back pain to improve and recommended very close outpatient follow-up.  She was advised to not perform any heavy lifting or bending from the waist.  She was given a work note per her request.  She understands return precautions.  She was discharged in stable condition.  She is ambulatory with a steady gait.   Patient's presentation is most consistent with acute complicated illness / injury requiring diagnostic workup.    FINAL CLINICAL IMPRESSION(S) / ED DIAGNOSES   Final diagnoses:  Lumbar radiculopathy     Rx / DC Orders   ED Discharge Orders          Ordered    predniSONE (STERAPRED UNI-PAK 21 TAB) 10 MG (21) TBPK tablet        05/17/22 1305    naproxen (NAPROSYN) 500 MG tablet  2 times daily with meals        05/17/22 1305             Note:  This document was prepared using Dragon voice recognition software and may include unintentional dictation errors.   Marquette Old, PA-C 05/17/22 1327    Nathaniel Man, MD 05/17/22 1521

## 2022-05-17 NOTE — ED Triage Notes (Signed)
Pt via POV from home. Pt was at work today, bent over and stood back up and since then has been having lower back pain. States OTC medication does not help. Pt is A&OX4 and NAD

## 2022-05-24 ENCOUNTER — Emergency Department
Admission: EM | Admit: 2022-05-24 | Discharge: 2022-05-24 | Disposition: A | Discharge status: Home / Self Care | Payer: Medicaid Other | Attending: Emergency Medicine | Admitting: Emergency Medicine

## 2022-05-24 ENCOUNTER — Other Ambulatory Visit: Payer: Self-pay

## 2022-05-24 DIAGNOSIS — R519 Headache, unspecified: Secondary | ICD-10-CM | POA: Diagnosis present

## 2022-05-24 DIAGNOSIS — J45909 Unspecified asthma, uncomplicated: Secondary | ICD-10-CM | POA: Diagnosis not present

## 2022-05-24 DIAGNOSIS — Z720 Tobacco use: Secondary | ICD-10-CM | POA: Diagnosis not present

## 2022-05-24 DIAGNOSIS — U071 COVID-19: Secondary | ICD-10-CM | POA: Diagnosis not present

## 2022-05-24 LAB — RESP PANEL BY RT-PCR (RSV, FLU A&B, COVID)  RVPGX2
Influenza A by PCR: NEGATIVE
Influenza B by PCR: NEGATIVE
Resp Syncytial Virus by PCR: NEGATIVE
SARS Coronavirus 2 by RT PCR: POSITIVE — AB

## 2022-05-24 LAB — GROUP A STREP BY PCR: Group A Strep by PCR: NOT DETECTED

## 2022-05-24 MED ORDER — ACETAMINOPHEN 325 MG PO TABS
650.0000 mg | ORAL_TABLET | Freq: Once | ORAL | Status: AC
  Administered 2022-05-24: 650 mg via ORAL
  Filled 2022-05-24: qty 2

## 2022-05-24 MED ORDER — IBUPROFEN 600 MG PO TABS
600.0000 mg | ORAL_TABLET | Freq: Once | ORAL | Status: AC
  Administered 2022-05-24: 600 mg via ORAL
  Filled 2022-05-24: qty 1

## 2022-05-24 NOTE — Discharge Instructions (Signed)
Your swab is positive for COVID-19.  Continue to take Tylenol/ibuprofen per package instructions to help with your symptoms.  Please return for any new, worsening, or change in symptoms or other concerns.  It was a pleasure caring for you today.

## 2022-05-24 NOTE — ED Triage Notes (Signed)
Pt now endorses that her one year old child has COVID.

## 2022-05-24 NOTE — ED Provider Notes (Signed)
[facility] Provider Note    Event Date/Time   First MD Initiated Contact with Patient 05/24/22 1256     (approximate)   History   Sore Throat and Generalized Body Aches   HPI  Kristy Reid is a 27 y.o. female with a past medical history of PTSD, depression, anxiety, bipolar disorder who presents today for evaluation of body aches, cough, sore throat, headache since yesterday.  She reports that her wife's child has COVID.  Patient reports that she has received her COVID vaccinations.  She did not receive a flu shot this year.  She reports that she has had a fever but she is unsure how high her temperature has been because her thermometer broke.  No chest pain or abdominal pain.  No shortness of breath.  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) 11/15/2015   PTSD (post-traumatic stress disorder) 11/15/2015   Severe recurrent major depression without psychotic features ([place])  11/13/2015          Physical Exam   Triage Vital Signs: ED Triage Vitals  Enc Vitals Group     BP 05/24/22 1251 121/71     Pulse Rate 05/24/22 1251 (!) 110     Resp 05/24/22 1251 18     Temp 05/24/22 1251 98.5 F (36.9 C)     Temp Source 05/24/22 1251 Oral     SpO2 05/24/22 1251 100 %     Weight 05/24/22 1252 150 lb (68 kg)     Height 05/24/22 1252 '5\' 2"'$  (1.575 m)     Head Circumference --      Peak Flow --      Pain Score --      Pain Loc --      Pain Edu? --      Excl. in [place]? --     Most recent vital signs: Vitals:   05/24/22 1251  BP: 121/71  Pulse: (!) 110  Resp: 18  Temp: 98.5 F (36.9 C)  SpO2: 100%    Physical Exam Vitals and nursing note reviewed.  Constitutional:      General: Awake and alert. No acute distress.    Appearance: Normal appearance. The patient is normal weight.  HENT:     Head: Normocephalic and atraumatic.     Mouth: Mucous membranes are moist.  Eyes:     General: PERRL. Normal EOMs        Right eye: No discharge.        Left eye: No discharge.     Conjunctiva/sclera: Conjunctivae normal.  Cardiovascular:     Rate  and Rhythm: Normal rate and regular rhythm.     Pulses: Normal pulses.  Pulmonary:     Effort: Pulmonary effort is normal. No respiratory distress.     Breath sounds: Normal breath sounds.  Abdominal:     Abdomen is soft. There is no abdominal tenderness. No rebound or guarding. No distention. Musculoskeletal:        General: No swelling. Normal range of motion.     Cervical back: Normal range of motion and neck supple.  Skin:    General: Skin is warm and dry.     Capillary Refill: Capillary refill takes less than 2 seconds.     Findings: No rash.  Neurological:     Mental Status: The patient is awake and alert.      ED Results / Procedures / Treatments   Labs (all labs ordered are listed, but only abnormal results are displayed) Labs Reviewed  RESP PANEL BY RT-PCR (RSV, FLU A&B, COVID)  [id number] - Abnormal;  Notable for the following components:      Result Value   SARS Coronavirus 2 by RT PCR POSITIVE (*)    All other components within normal limits  GROUP A STREP BY PCR     EKG     RADIOLOGY     PROCEDURES:  Critical Care performed:   Procedures   MEDICATIONS ORDERED IN ED: Medications  acetaminophen (TYLENOL) tablet 650 mg (650 mg Oral Given 05/24/22 1309)  ibuprofen (ADVIL) tablet 600 mg (600 mg Oral Given 05/24/22 1309)     IMPRESSION / MDM / ASSESSMENT AND PLAN / ED COURSE  I reviewed the triage vital signs and the nursing notes.   Differential diagnosis includes, but is not limited to, COVID, flu, RSV, strep pharyngitis, other URI.  Patient is awake and alert, mildly tachycardic on arrival but normal tensive and afebrile.  He has a oxygen saturation of 100% on room air.  She demonstrates no increased work of breathing.  Lungs are clear to auscultation bilaterally, no fever, do not suspect pneumonia.  Swabs obtained are positive for COVID-19.  Discussed this result with the patient.  We discussed Tylenol/ibuprofen per package instructions to help with her symptoms.  She does not meet criteria for Paxlovid and reports that she does not want it.  We discussed that she is highly contagious to others.  Discussed isolation instructions.  Discussed return precautions and the importance of close outpatient follow-up.  Patient understands and agrees with plan.  She was discharged in stable condition.   Patient's presentation is most consistent with acute complicated illness / injury requiring diagnostic workup.      FINAL CLINICAL IMPRESSION(S) / ED DIAGNOSES   Final diagnoses:  [id number]     Rx / DC Orders   ED Discharge Orders     None        Note:  This document was prepared using Dragon voice recognition software and may include unintentional dictation errors.   Emeline Gins 05/24/22 1725    Harvest Dark, MD 05/27/22 1358

## 2022-05-24 NOTE — ED Triage Notes (Signed)
Pt states her body hurts as well as has congestion, headache and sore throat. Pt states it started yesterday.

## 2022-06-09 ENCOUNTER — Encounter: Payer: Self-pay | Admitting: Emergency Medicine

## 2022-06-09 ENCOUNTER — Emergency Department: Payer: Medicaid Other

## 2022-06-09 ENCOUNTER — Emergency Department
Admission: EM | Admit: 2022-06-09 | Discharge: 2022-06-09 | Disposition: A | Discharge status: Home / Self Care | Payer: Medicaid Other | Attending: Emergency Medicine | Admitting: Emergency Medicine

## 2022-06-09 ENCOUNTER — Other Ambulatory Visit: Payer: Self-pay

## 2022-06-09 DIAGNOSIS — D649 Anemia, unspecified: Secondary | ICD-10-CM | POA: Insufficient documentation

## 2022-06-09 DIAGNOSIS — J45909 Unspecified asthma, uncomplicated: Secondary | ICD-10-CM | POA: Insufficient documentation

## 2022-06-09 DIAGNOSIS — Z79899 Other long term (current) drug therapy: Secondary | ICD-10-CM | POA: Insufficient documentation

## 2022-06-09 DIAGNOSIS — I10 Essential (primary) hypertension: Secondary | ICD-10-CM | POA: Diagnosis not present

## 2022-06-09 DIAGNOSIS — R079 Chest pain, unspecified: Secondary | ICD-10-CM | POA: Diagnosis present

## 2022-06-09 LAB — COMPREHENSIVE METABOLIC PANEL
ALT: 9 U/L (ref 0–44)
AST: 22 U/L (ref 15–41)
Albumin: 3.5 g/dL (ref 3.5–5.0)
Alkaline Phosphatase: 52 U/L (ref 38–126)
Anion gap: 2 — ABNORMAL LOW (ref 5–15)
BUN: 14 mg/dL (ref 6–20)
CO2: 22 mmol/L (ref 22–32)
Calcium: 8.5 mg/dL — ABNORMAL LOW (ref 8.9–10.3)
Chloride: 110 mmol/L (ref 98–111)
Creatinine, Ser: 0.83 mg/dL (ref 0.44–1.00)
GFR, Estimated: >60 mL/min (ref >60)
Glucose, Bld: 98 mg/dL (ref 70–99)
Potassium: 3.7 mmol/L (ref 3.5–5.1)
Sodium: 134 mmol/L — ABNORMAL LOW (ref 135–145)
Total Bilirubin: 0.5 mg/dL (ref 0.3–1.2)
Total Protein: 7.1 g/dL (ref 6.5–8.1)

## 2022-06-09 LAB — TSH: TSH: 1.555 u[IU]/mL (ref 0.350–4.500)

## 2022-06-09 LAB — CBC WITH DIFFERENTIAL/PLATELET
Abs Immature Granulocytes: 0.01 10*3/uL (ref 0.00–0.07)
Basophils Absolute: 0 10*3/uL (ref 0.0–0.1)
Basophils Relative: 1 %
Eosinophils Absolute: 0.1 10*3/uL (ref 0.0–0.5)
Eosinophils Relative: 1 %
HCT: 28.2 % — ABNORMAL LOW (ref 36.0–46.0)
Hemoglobin: 8.5 g/dL — ABNORMAL LOW (ref 12.0–15.0)
Immature Granulocytes: 0 %
Lymphocytes Relative: 49 %
Lymphs Abs: 2.9 10*3/uL (ref 0.7–4.0)
MCH: 23.4 pg — ABNORMAL LOW (ref 26.0–34.0)
MCHC: 30.1 g/dL (ref 30.0–36.0)
MCV: 77.5 fL — ABNORMAL LOW (ref 80.0–100.0)
Monocytes Absolute: 0.6 10*3/uL (ref 0.1–1.0)
Monocytes Relative: 11 %
Neutro Abs: 2.2 10*3/uL (ref 1.7–7.7)
Neutrophils Relative %: 38 %
Platelets: 361 10*3/uL (ref 150–400)
RBC: 3.64 MIL/uL — ABNORMAL LOW (ref 3.87–5.11)
RDW: 16.1 % — ABNORMAL HIGH (ref 11.5–15.5)
WBC: 5.8 10*3/uL (ref 4.0–10.5)
nRBC: 0 % (ref 0.0–0.2)

## 2022-06-09 LAB — URINALYSIS, ROUTINE W REFLEX MICROSCOPIC
Bilirubin Urine: NEGATIVE
Glucose, UA: NEGATIVE mg/dL
Hgb urine dipstick: NEGATIVE
Ketones, ur: 5 mg/dL — AB
Leukocytes,Ua: NEGATIVE
Nitrite: NEGATIVE
Protein, ur: 30 mg/dL — AB
Specific Gravity, Urine: 1.033 — ABNORMAL HIGH (ref 1.005–1.030)
pH: 5 (ref 5.0–8.0)

## 2022-06-09 LAB — TROPONIN I (HIGH SENSITIVITY): Troponin I (High Sensitivity): <2 ng/L (ref <18)

## 2022-06-09 LAB — PREGNANCY, URINE: Preg Test, Ur: NEGATIVE

## 2022-06-09 MED ORDER — TRANEXAMIC ACID 650 MG PO TABS: 1300.0000 mg | ORAL_TABLET | Freq: Three times a day (TID) | ORAL | 0 refills | Status: DC

## 2022-06-09 MED ORDER — KETOROLAC TROMETHAMINE 30 MG/ML IJ SOLN
30.0000 mg | Freq: Once | INTRAMUSCULAR | Status: AC
  Administered 2022-06-09: 30 mg via INTRAVENOUS
  Filled 2022-06-09: qty 1

## 2022-06-09 MED ORDER — FERROUS SULFATE 325 (65 FE) MG PO TBEC: 325.0000 mg | DELAYED_RELEASE_TABLET | Freq: Every day | ORAL | 3 refills | Status: DC

## 2022-06-09 NOTE — ED Triage Notes (Signed)
Patient ambulatory to triage with steady gait, without difficulty or distress noted; pt reports since yesterday having SHOB, dizziness, chills, weakness; st that she has had 3 episodes of heavy vag bleeding this month and believes that her hemoglobin is low; st hx of transfusion in Nov/Dec and was recommended to have hysterectomy

## 2022-06-09 NOTE — ED Notes (Signed)
Pt A&O x4, no obvious distress noted, respirations regular/unlabored. Pt verbalizes understanding of discharge instructions. Pt able to ambulate from ED independently.   

## 2022-06-09 NOTE — ED Provider Notes (Signed)
[facility] Provider Note    Event Date/Time   First MD Initiated Contact with Patient 06/09/22 [phone]     (approximate)   History   Dizziness   HPI  Kristy Reid is a 27 y.o. female with history of iron deficiency anemia, bipolar disorder, chronic back pain, hypertension who presents to the emergency department with complaints of chest pain, shortness of breath, fatigue, feeling cold and concerns that her hemoglobin is low.  She has had blood transfusions previously.  States she has had 3 episodes of heavy bleeding this month alone and is supposed to have a hysterectomy but cannot afford it because she does not have insurance.  She was last admitted to the hospital in November 2023 requiring blood transfusion for hemoglobin of 7.6 and IV estrogen for continued bleeding.  States yesterday she was passing clots but now bleeding is extremely light and almost gone.  No fevers, cough, vomiting, diarrhea, urinary symptoms.   History provided by patient.    Past Medical History:  Diagnosis Date   Anemia    Anemia    Anxiety    Asthma    Bipolar 1 disorder (HCC)    Chronic back pain    Depression    Hypertension    Insomnia    Personality disorder ([place])    "boarderline"   PTSD (post-traumatic stress disorder)     Past Surgical History:  Procedure Laterality Date   BUNIONECTOMY Bilateral    feet   wrist sugery      MEDICATIONS:  Prior to Admission medications   Medication Sig Start Date End Date Taking? Authorizing Provider  acetaminophen (TYLENOL) 500 MG tablet Take 500-1,000 mg by mouth every 6 (six) hours as needed for mild pain or moderate pain.    [provider]  albuterol (VENTOLIN HFA) 108 (90 Base) MCG/ACT inhaler Inhale 2 puffs into the lungs every 6 (six) hours as needed for wheezing or shortness of breath.    [provider]  fexofenadine-pseudoephedrine (ALLEGRA-D) 60-120 MG 12 hr tablet Take 1 tablet by mouth 2  (two) times daily. Patient not taking: Reported on 02/10/2022 02/01/19   Sable Feil, PA-C  fluticasone [facility]) 50 MCG/ACT nasal spray Place 1 spray into both nostrils daily.    [provider]  hydrOXYzine (ATARAX/VISTARIL) 10 MG tablet Take 10 mg by mouth 3 (three) times daily as needed for itching or anxiety.    [provider]  ibuprofen (ADVIL) 600 MG tablet Take 1 tablet (600 mg total) by mouth every 6 (six) hours as needed (mild pain). [id number]   Ed Blalock, CNM  meclizine (ANTIVERT) 25 MG tablet Take 25 mg by mouth 3 (three) times daily as needed for dizziness.    [provider]  medroxyPROGESTERone (PROVERA) 10 MG tablet Take 2 tablets (20 mg total) by mouth 2 (two) times daily for 10 days. 02/11/22 [id number]  Ed Blalock, CNM  metoprolol tartrate (LOPRESSOR) 50 MG tablet Take 1.5 tablets (75 mg total) by mouth 2 (two) times daily. 10/09/19 01/07/20  Kate Sable, MD  predniSONE (STERAPRED UNI-PAK 21 TAB) 10 MG (21) TBPK tablet Take 6 tablets by mouth at once on day 1 and decrease by 1 tablet for each subsequent day 05/17/22   Poggi, Eliezer Lofts E, PA-C  QUEtiapine (SEROQUEL) 300 MG tablet Take 300 mg by mouth at bedtime.  Patient not taking: Reported on 02/10/2022    [provider]  senna-docusate (SENOKOT-S) 8.6-50 MG tablet  Take 1 tablet by mouth at bedtime as needed for mild constipation. [id number]   Ed Blalock, CNM  traZODone (DESYREL) 150 MG tablet Take 150 mg by mouth at bedtime.    [provider]  loratadine (CLARITIN) 10 MG tablet Take 1 tablet (10 mg total) by mouth daily. 05/05/18 08/31/19  [name], Madie Reno, MD    Physical Exam   Triage Vital Signs: ED Triage Vitals  Enc Vitals Group     BP 06/09/22 0035 120/69     Pulse Rate 06/09/22 0035 73     Resp 06/09/22 0035 20     Temp 06/09/22 0035 98.7 F (37.1 C)     Temp Source 06/09/22 0035 Oral     SpO2 06/09/22 0035 99  %     Weight 06/09/22 0032 140 lb (63.5 kg)     Height 06/09/22 0032 5\' 2"  (1.575 m)     Head Circumference --      Peak Flow --      Pain Score --      Pain Loc --      Pain Edu? --      Excl. in [place]? --     Most recent vital signs: Vitals:   06/09/22 0035 06/09/22 0203  BP: 120/69 118/64  Pulse: 73 76  Resp: 20   Temp: 98.7 F (37.1 C)   SpO2: 99%     CONSTITUTIONAL: Alert, responds appropriately to questions. Well-appearing; well-nourished HEAD: Normocephalic, atraumatic EYES: Conjunctivae clear, pupils appear equal, sclera nonicteric ENT: normal nose; moist mucous membranes NECK: Supple, normal ROM CARD: RRR; S1 and S2 appreciated RESP: Normal chest excursion without splinting or tachypnea; breath sounds clear and equal bilaterally; no wheezes, no rhonchi, no rales, no hypoxia or respiratory distress, speaking full sentences ABD/GI: Non-distended; soft, non-tender, no rebound, no guarding, no peritoneal signs BACK: The back appears normal EXT: Normal ROM in all joints; no deformity noted, no edema SKIN: Normal color for age and race; warm; no rash on exposed skin NEURO: Moves all extremities equally, normal speech PSYCH: The patient's mood and manner are appropriate.   ED Results / Procedures / Treatments   LABS: (all labs ordered are listed, but only abnormal results are displayed) Labs Reviewed  CBC WITH DIFFERENTIAL/PLATELET - Abnormal; Notable for the following components:      Result Value   RBC 3.64 (*)    Hemoglobin 8.5 (*)    HCT 28.2 (*)    MCV 77.5 (*)    MCH 23.4 (*)    RDW 16.1 (*)    All other components within normal limits  COMPREHENSIVE METABOLIC PANEL - Abnormal; Notable for the following components:   Sodium 134 (*)    Calcium 8.5 (*)    Anion gap 2 (*)    All other components within normal limits  URINALYSIS, ROUTINE W REFLEX MICROSCOPIC - Abnormal; Notable for the following components:   Color, Urine YELLOW (*)    APPearance HAZY (*)     Specific Gravity, Urine 1.033 (*)    Ketones, ur 5 (*)    Protein, ur 30 (*)    Bacteria, UA RARE (*)    All other components within normal limits  TSH  PREGNANCY, URINE  TYPE AND SCREEN  TROPONIN I (HIGH SENSITIVITY)     EKG:    Date: 06/09/2022  00:34  Rate: 78  Rhythm: normal sinus rhythm  QRS Axis: normal  Intervals: normal  ST/T Wave abnormalities: normal  Conduction Disutrbances: none  Narrative Interpretation: unremarkable     RADIOLOGY: My personal review and interpretation of imaging: X-ray shows no acute abnormality.  I have personally reviewed all radiology reports.   DG Chest Portable 1 View  Result Date: 06/09/2022 CLINICAL DATA:  Chest pain and shortness of breath EXAM: PORTABLE CHEST 1 VIEW COMPARISON:  08/27/2019 FINDINGS: The heart size and mediastinal contours are within normal limits. Both lungs are clear. The visualized skeletal structures are unremarkable. IMPRESSION: No active disease. Electronically Signed   By: Inez Catalina M.D.   On: 06/09/2022 01:36     PROCEDURES:  Critical Care performed: No     .1-3 Lead EKG Interpretation  Performed by: Penda Venturi, Delice Bison, DO Authorized by: Tomasina Keasling, Delice Bison, DO     Interpretation: normal     ECG rate:  73   ECG rate assessment: normal     Rhythm: sinus rhythm     Ectopy: none     Conduction: normal       IMPRESSION / MDM / ASSESSMENT AND PLAN / ED COURSE  I reviewed the triage vital signs and the nursing notes.    Patient here with complaints of feeling chest pain, short of breath, chills and fatigue.  History of iron deficiency anemia related to metromenorrhagia.   The patient is on the cardiac monitor to evaluate for evidence of arrhythmia and/or significant heart rate changes.   DIFFERENTIAL DIAGNOSIS (includes but not limited to):   Anemia, electrolyte derangement, dehydration, thyroid dysfunction, less likely ACS, PE, pneumonia   Patient's presentation is most consistent with  acute presentation with potential threat to life or bodily function.   PLAN: Will obtain labs, chest x-ray.  EKG nonischemic.  Will give Toradol for chest discomfort.  States that her bleeding has almost completely resolved.   MEDICATIONS GIVEN IN ED: Medications  ketorolac (TORADOL) 30 MG/ML injection 30 mg (30 mg Intravenous Given 06/09/22 0126)     ED COURSE: Patient's hemoglobin is 8.5.  Hemodynamically stable and not orthostatic.  No active hemorrhage.  No indication for emergent transfusion.  Will discharge with outpatient follow-up.  Will place her on iron supplementation daily and give her prescription for list needed if heavy bleeding were to start again.  Cardiac labs unremarkable.  Normal electrolytes, TSH.  Negative pregnancy test.  Patient comfortable with this plan.   At this time, I do not feel there is any life-threatening condition present. I reviewed all nursing notes, vitals, pertinent previous records.  All lab and urine results, EKGs, imaging ordered have been independently reviewed and interpreted by myself.  I reviewed all available radiology reports from any imaging ordered this visit.  Based on my assessment, I feel the patient is safe to be discharged home without further emergent workup and can continue workup as an outpatient as needed. Discussed all findings, treatment plan as well as usual and customary return precautions.  They verbalize understanding and are comfortable with this plan.  Outpatient follow-up has been provided as needed.  All questions have been answered.    CONSULTS:  none   OUTSIDE RECORDS REVIEWED: Reviewed patient's last admission in November 2023.       FINAL CLINICAL IMPRESSION(S) / ED DIAGNOSES   Final diagnoses:  Chest pain, unspecified type  Anemia, unspecified type     Rx / DC Orders   ED Discharge Orders          Ordered    tranexamic acid (LYSTEDA) 650 MG TABS tablet  3 times daily  06/09/22 0215    ferrous  sulfate 325 (65 FE) MG EC tablet  Daily with breakfast        06/09/22 0215             Note:  This document was prepared using Dragon voice recognition software and may include unintentional dictation errors.   Cataleyah Colborn, Delice Bison, DO 06/09/22 [phone]

## 2022-06-19 ENCOUNTER — Encounter: Payer: Self-pay | Admitting: Oncology

## 2022-07-01 ENCOUNTER — Emergency Department
Admission: EM | Admit: 2022-07-01 | Discharge: 2022-07-01 | Disposition: A | Discharge status: Home / Self Care | Payer: Medicaid Other | Attending: Emergency Medicine | Admitting: Emergency Medicine

## 2022-07-01 DIAGNOSIS — R059 Cough, unspecified: Secondary | ICD-10-CM | POA: Insufficient documentation

## 2022-07-01 DIAGNOSIS — Z5321 Procedure and treatment not carried out due to patient leaving prior to being seen by health care provider: Secondary | ICD-10-CM | POA: Insufficient documentation

## 2022-07-01 NOTE — ED Notes (Signed)
Pt called for triage x3, pt no visualized in the lobby at this time.

## 2022-07-07 ENCOUNTER — Encounter: Payer: Self-pay | Admitting: Oncology

## 2022-07-08 ENCOUNTER — Encounter: Payer: Self-pay | Admitting: Oncology

## 2022-07-08 ENCOUNTER — Emergency Department
Admission: EM | Admit: 2022-07-08 | Discharge: 2022-07-08 | Disposition: A | Discharge status: Home / Self Care | Payer: Medicaid Other | Attending: Emergency Medicine | Admitting: Emergency Medicine

## 2022-07-08 ENCOUNTER — Telehealth: Payer: Self-pay | Admitting: Emergency Medicine

## 2022-07-08 ENCOUNTER — Other Ambulatory Visit: Payer: Self-pay

## 2022-07-08 DIAGNOSIS — N939 Abnormal uterine and vaginal bleeding, unspecified: Secondary | ICD-10-CM | POA: Insufficient documentation

## 2022-07-08 DIAGNOSIS — I10 Essential (primary) hypertension: Secondary | ICD-10-CM | POA: Insufficient documentation

## 2022-07-08 DIAGNOSIS — J45909 Unspecified asthma, uncomplicated: Secondary | ICD-10-CM | POA: Insufficient documentation

## 2022-07-08 LAB — BASIC METABOLIC PANEL
Anion gap: 8 (ref 5–15)
BUN: 10 mg/dL (ref 6–20)
CO2: 22 mmol/L (ref 22–32)
Calcium: 9.2 mg/dL (ref 8.9–10.3)
Chloride: 106 mmol/L (ref 98–111)
Creatinine, Ser: 0.77 mg/dL (ref 0.44–1.00)
GFR, Estimated: >60 mL/min (ref >60)
Glucose, Bld: 89 mg/dL (ref 70–99)
Potassium: 3.7 mmol/L (ref 3.5–5.1)
Sodium: 136 mmol/L (ref 135–145)

## 2022-07-08 LAB — CBC
HCT: 33.6 % — ABNORMAL LOW (ref 36.0–46.0)
Hemoglobin: 9.7 g/dL — ABNORMAL LOW (ref 12.0–15.0)
MCH: 22.1 pg — ABNORMAL LOW (ref 26.0–34.0)
MCHC: 28.9 g/dL — ABNORMAL LOW (ref 30.0–36.0)
MCV: 76.5 fL — ABNORMAL LOW (ref 80.0–100.0)
Platelets: 406 10*3/uL — ABNORMAL HIGH (ref 150–400)
RBC: 4.39 MIL/uL (ref 3.87–5.11)
RDW: 17.5 % — ABNORMAL HIGH (ref 11.5–15.5)
WBC: 5.9 10*3/uL (ref 4.0–10.5)
nRBC: 0 % (ref 0.0–0.2)

## 2022-07-08 LAB — POC URINE PREG, ED: Preg Test, Ur: NEGATIVE

## 2022-07-08 LAB — TROPONIN I (HIGH SENSITIVITY): Troponin I (High Sensitivity): <2 ng/L (ref <18)

## 2022-07-08 MED ORDER — MEDROXYPROGESTERONE ACETATE 10 MG PO TABS: 20.0000 mg | ORAL_TABLET | Freq: Two times a day (BID) | ORAL | 0 refills | Status: DC

## 2022-07-08 MED ORDER — MEDROXYPROGESTERONE ACETATE 10 MG PO TABS
20.0000 mg | ORAL_TABLET | Freq: Every day | ORAL | Status: DC
  Administered 2022-07-08: 20 mg via ORAL
  Filled 2022-07-08: qty 2

## 2022-07-08 MED ORDER — FERROUS SULFATE 325 (65 FE) MG PO TBEC: 325.0000 mg | DELAYED_RELEASE_TABLET | Freq: Every day | ORAL | 3 refills | Status: DC
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
## 2022-07-08 NOTE — ED Triage Notes (Signed)
Pt to ED for vaginal bleeding for 3 days. States bleeding is heavier than normal and she is anemic, had blood transfusion in November. Ambulatory to triage, NAD noted

## 2022-07-08 NOTE — Discharge Instructions (Signed)
Please continue to take iron supplement.  Please start taking the Provera 20 mg twice a day for the next 10 days.  Support and you follow-up with Dr. Oretha Milch office.  If your bleeding becomes heavier or you are feeling more lightheaded or your chest pain worsens then please return to the emergency department.

## 2022-07-08 NOTE — Telephone Encounter (Signed)
Resent script for provera to pharmacy.

## 2022-08-12 ENCOUNTER — Emergency Department: Payer: Medicaid Other

## 2022-08-12 ENCOUNTER — Emergency Department
Admission: EM | Admit: 2022-08-12 | Discharge: 2022-08-12 | Disposition: A | Discharge status: Home / Self Care | Payer: Medicaid Other | Attending: Student in an Organized Health Care Education/Training Program | Admitting: Student in an Organized Health Care Education/Training Program

## 2022-08-12 ENCOUNTER — Other Ambulatory Visit: Payer: Self-pay

## 2022-08-12 ENCOUNTER — Encounter: Payer: Self-pay | Admitting: Oncology

## 2022-08-12 DIAGNOSIS — D649 Anemia, unspecified: Secondary | ICD-10-CM | POA: Diagnosis not present

## 2022-08-12 DIAGNOSIS — R0602 Shortness of breath: Secondary | ICD-10-CM | POA: Diagnosis present

## 2022-08-12 LAB — BASIC METABOLIC PANEL
Anion gap: 6 (ref 5–15)
BUN: 9 mg/dL (ref 6–20)
CO2: 23 mmol/L (ref 22–32)
Calcium: 9.2 mg/dL (ref 8.9–10.3)
Chloride: 106 mmol/L (ref 98–111)
Creatinine, Ser: 0.78 mg/dL (ref 0.44–1.00)
GFR, Estimated: >60 mL/min (ref >60)
Glucose, Bld: 92 mg/dL (ref 70–99)
Potassium: 3.5 mmol/L (ref 3.5–5.1)
Sodium: 135 mmol/L (ref 135–145)

## 2022-08-12 LAB — CBC
HCT: 31.8 % — ABNORMAL LOW (ref 36.0–46.0)
Hemoglobin: 9.6 g/dL — ABNORMAL LOW (ref 12.0–15.0)
MCH: 21.3 pg — ABNORMAL LOW (ref 26.0–34.0)
MCHC: 30.2 g/dL (ref 30.0–36.0)
MCV: 70.5 fL — ABNORMAL LOW (ref 80.0–100.0)
Platelets: 320 10*3/uL (ref 150–400)
RBC: 4.51 MIL/uL (ref 3.87–5.11)
RDW: 18.6 % — ABNORMAL HIGH (ref 11.5–15.5)
WBC: 5 10*3/uL (ref 4.0–10.5)
nRBC: 0 % (ref 0.0–0.2)

## 2022-08-12 LAB — TYPE AND SCREEN
ABO/RH(D): O POS
Antibody Screen: NEGATIVE

## 2022-08-12 NOTE — ED Provider Notes (Signed)
[facility] Provider Note    Event Date/Time   First MD Initiated Contact with Patient 08/12/22 1350     (approximate)   History   abnormal labs and Shortness of Breath   HPI  Kristy Reid is a 27 y.o. female with a history of menometrorrhagia on Depo with history of recurrent symptomatic iron deficiency anemia presents to the ER for evaluation of several days to weeks of fatigue malaise and feeling like her anemia was getting worse.  Has required blood transfusions in the past.  She is not having any active bleeding at this time.  Has been taking supplemental iron.  Had outpatient labs and was told to come to the ER because her hemoglobin was 8.  Also had a recent TB test that was equivocal sent to the ER for chest x-ray.     Physical Exam   Triage Vital Signs: ED Triage Vitals  Enc Vitals Group     BP 08/12/22 1227 120/78     Pulse Rate 08/12/22 1227 96     Resp 08/12/22 1227 16     Temp 08/12/22 1227 98.6 F (37 C)     Temp Source 08/12/22 1227 Oral     SpO2 08/12/22 1227 99 %     Weight 08/12/22 1228 138 lb (62.6 kg)     Height 08/12/22 1228 5\' 2"  (1.575 m)     Head Circumference --      Peak Flow --      Pain Score 08/12/22 1206 0     Pain Loc --      Pain Edu? --      Excl. in [place]? --     Most recent vital signs: Vitals:   08/12/22 1227  BP: 120/78  Pulse: 96  Resp: 16  Temp: 98.6 F (37 C)  SpO2: 99%     Constitutional: Alert  Eyes: Conjunctivae are normal.  Head: Atraumatic. Nose: No congestion/rhinnorhea. Mouth/Throat: Mucous membranes are moist.   Neck: Painless ROM.  Cardiovascular:   Good peripheral circulation. Respiratory: Normal respiratory effort.  No retractions.  Gastrointestinal: Soft and nontender.  Musculoskeletal:  no deformity Neurologic:  MAE spontaneously. No gross focal neurologic deficits are appreciated.  Skin:  Skin is warm, dry and intact. No rash noted. Psychiatric: Mood and affect are  normal. Speech and behavior are normal.    ED Results / Procedures / Treatments   Labs (all labs ordered are listed, but only abnormal results are displayed) Labs Reviewed  CBC - Abnormal; Notable for the following components:      Result Value   Hemoglobin 9.6 (*)    HCT 31.8 (*)    MCV 70.5 (*)    MCH 21.3 (*)    RDW 18.6 (*)    All other components within normal limits  BASIC METABOLIC PANEL  TYPE AND SCREEN     EKG     RADIOLOGY Please see ED Course for my review and interpretation.  I personally reviewed all radiographic images ordered to evaluate for the above acute complaints and reviewed radiology reports and findings.  These findings were personally discussed with the patient.  Please see medical record for radiology report.    PROCEDURES:  Critical Care performed:   Procedures   MEDICATIONS ORDERED IN ED: Medications - No data to display   IMPRESSION / MDM / ASSESSMENT AND PLAN / ED COURSE  I reviewed the triage vital signs and the nursing notes.  Differential diagnosis includes, but is not limited to, anemia, iron deficiency anemia, depression, electrolyte abnormality, dehydration  Patient presenting to the ER for evaluation of symptoms as described above.  Based on symptoms, risk factors and considered above differential, this presenting complaint could reflect a potentially life-threatening illness therefore the patient will be placed on continuous pulse oximetry and telemetry for monitoring.  Laboratory evaluation will be sent to evaluate for the above complaints.  Patient's hemoglobin significantly improved to 9.6.  No signs of active bleeding.  She is hemodynamically stable in no acute distress.  Chest x-ray my review and interpretation without evidence of infiltrate or consolidation.  No sign of TB.  Patient does appear stable and appropriate for outpatient follow-up.       FINAL CLINICAL IMPRESSION(S) / ED  DIAGNOSES   Final diagnoses:  Anemia, unspecified type     Rx / DC Orders   ED Discharge Orders     None        Note:  This document was prepared using Dragon voice recognition software and may include unintentional dictation errors.    Willy Eddy, MD 08/12/22 [phone]

## 2022-08-12 NOTE — ED Triage Notes (Signed)
Pt to ED from doctors office for abnormal labs, reports low hgb and rxn to tb skin test.  shob for 3-4 days. +night sweats.  Hx blood transfusion in November, reports heavy periods. Ambulatory to triage, Nad noted.

## 2022-08-17 ENCOUNTER — Inpatient Hospital Stay: Payer: Medicaid Other | Attending: Oncology | Admitting: Oncology

## 2022-08-17 ENCOUNTER — Inpatient Hospital Stay: Payer: Medicaid Other

## 2022-08-17 ENCOUNTER — Encounter: Payer: Self-pay | Admitting: Oncology

## 2022-08-17 VITALS — BP 102/70 | HR 88 | Temp 99.8°F | Wt 137.7 lb

## 2022-08-17 DIAGNOSIS — D509 Iron deficiency anemia, unspecified: Secondary | ICD-10-CM

## 2022-08-17 LAB — CBC (CANCER CENTER ONLY)
HCT: 32.4 % — ABNORMAL LOW (ref 36.0–46.0)
Hemoglobin: 9.6 g/dL — ABNORMAL LOW (ref 12.0–15.0)
MCH: 21.3 pg — ABNORMAL LOW (ref 26.0–34.0)
MCHC: 29.6 g/dL — ABNORMAL LOW (ref 30.0–36.0)
MCV: 72 fL — ABNORMAL LOW (ref 80.0–100.0)
Platelet Count: 391 10*3/uL (ref 150–400)
RBC: 4.5 MIL/uL (ref 3.87–5.11)
RDW: 18.7 % — ABNORMAL HIGH (ref 11.5–15.5)
WBC Count: 5.3 10*3/uL (ref 4.0–10.5)
nRBC: 0 % (ref 0.0–0.2)

## 2022-08-17 LAB — FERRITIN: Ferritin: 3 ng/mL — ABNORMAL LOW (ref 11–307)

## 2022-08-17 LAB — IRON AND TIBC
Iron: 25 ug/dL — ABNORMAL LOW (ref 28–170)
Saturation Ratios: 5 % — ABNORMAL LOW (ref 10.4–31.8)
TIBC: 533 ug/dL — ABNORMAL HIGH (ref 250–450)
UIBC: 508 ug/dL

## 2022-08-17 LAB — FOLATE: Folate: 14.1 ng/mL (ref >5.9)

## 2022-08-17 LAB — VITAMIN B12: Vitamin B-12: 388 pg/mL (ref 180–914)

## 2022-08-17 NOTE — Progress Notes (Signed)
[facility]  Telephone:([phone]) [phone] [phone]) [phone]  ID: Kristy Reid OB: 1995-04-11  MR#: [id number]  AOZ#:[id number]  Patient Care Team: Center, [facility] as PCP - General Debbe Odea, MD as PCP - Cardiology (Cardiology)  CHIEF COMPLAINT: Iron deficiency anemia.  INTERVAL HISTORY: Patient is a 26 year old female who was last evaluated in clinic greater than 4 years ago for anemia in pregnancy.  She returns to clinic today for further evaluation of persistent iron deficiency anemia.  She has mild fatigue, but otherwise feels well.  She has no neurologic complaints.  She denies any recent fevers or illnesses.  She has a good appetite and denies weight loss.  She has no chest pain, shortness of breath, cough, or hemoptysis.  She denies any nausea, vomiting, constipation, or diarrhea.  She has no melena or hematochezia.  She has no urinary complaints.  Patient otherwise feels well and offers no further specific complaints today.  REVIEW OF SYSTEMS:   Review of Systems  Constitutional:  Positive for malaise/fatigue. Negative for fever and weight loss.  Respiratory: Negative.  Negative for cough, hemoptysis and shortness of breath.   Cardiovascular: Negative.  Negative for chest pain and leg swelling.  Gastrointestinal: Negative.  Negative for abdominal pain, blood in stool and melena.  Genitourinary: Negative.  Negative for hematuria.  Musculoskeletal: Negative.  Negative for back pain.  Skin: Negative.  Negative for rash.  Neurological: Negative.  Negative for dizziness, focal weakness, weakness and headaches.  Psychiatric/Behavioral: Negative.  The patient is not nervous/anxious.     As per HPI. Otherwise, a complete review of systems is negative.  PAST MEDICAL HISTORY: Past Medical History:  Diagnosis Date   Anemia    Anemia    Anxiety    Asthma    Bipolar 1 disorder (HCC)    Chronic back pain    Depression    Hypertension     Insomnia    Personality disorder (HCC)    "boarderline"   PTSD (post-traumatic stress disorder)     PAST SURGICAL HISTORY: Past Surgical History:  Procedure Laterality Date   BUNIONECTOMY Bilateral    feet   wrist sugery      FAMILY HISTORY: Family History  Problem Relation Age of Onset   Diabetes Mother    Hypertension Mother    Mental illness Mother    Diabetes Maternal Grandmother    Heart disease Maternal Grandmother    Cancer Neg Hx    Ovarian cancer Neg Hx     ADVANCED DIRECTIVES (Y/N):  N  HEALTH MAINTENANCE: Social History   Tobacco Use   Smoking status: Former    Types: Cigarettes    Quit date: 08/13/2015    Years since quitting: 7.0   Smokeless tobacco: Never  Vaping Use   Vaping Use: Never used  Substance Use Topics   Alcohol use: No   Drug use: Yes    Frequency: 7.0 times per week    Types: Marijuana    Comment: daily use, last use today 08/31/2019     Colonoscopy:  PAP:  Bone density:  Lipid panel:  Allergies  Allergen Reactions   Penicillins Hives    Has patient had a PCN reaction causing immediate rash, facial/tongue/throat swelling, SOB or lightheadedness with hypotension: No Has patient had a PCN reaction causing severe rash involving mucus membranes or skin necrosis: No Has patient had a PCN reaction that required hospitalization No Has patient had a PCN reaction occurring within the last 10 years:  No If all of the above answers are "NO", then may proceed with Cephalosporin use.   Rocephin [Ceftriaxone] Hives   Zithromax [Azithromycin] Itching and Nausea And Vomiting   Lidocaine Hives, Itching and Rash    Current Outpatient Medications  Medication Sig Dispense Refill   acetaminophen (TYLENOL) 500 MG tablet Take 500-1,000 mg by mouth every 6 (six) hours as needed for mild pain or moderate pain.     albuterol (VENTOLIN HFA) 108 (90 Base) MCG/ACT inhaler Inhale 2 puffs into the lungs every 6 (six) hours as needed for wheezing or  shortness of breath.     ferrous sulfate 325 (65 FE) MG EC tablet Take 1 tablet (325 mg total) by mouth daily with breakfast. 60 tablet 3   fluticasone (FLONASE) 50 MCG/ACT nasal spray Place 1 spray into both nostrils daily.     hydrOXYzine (ATARAX/VISTARIL) 10 MG tablet Take 10 mg by mouth 3 (three) times daily as needed for itching or anxiety.     ibuprofen (ADVIL) 600 MG tablet Take 1 tablet (600 mg total) by mouth every 6 (six) hours as needed (mild pain). 30 tablet 0   meclizine (ANTIVERT) 25 MG tablet Take 25 mg by mouth 3 (three) times daily as needed for dizziness.     metoprolol tartrate (LOPRESSOR) 50 MG tablet Take 1.5 tablets (75 mg total) by mouth 2 (two) times daily. 90 tablet 5   predniSONE (STERAPRED UNI-PAK 21 TAB) 10 MG (21) TBPK tablet Take 6 tablets by mouth at once on day 1 and decrease by 1 tablet for each subsequent day 21 tablet 0   senna-docusate (SENOKOT-S) 8.6-50 MG tablet Take 1 tablet by mouth at bedtime as needed for mild constipation.     tranexamic acid (LYSTEDA) 650 MG TABS tablet Take 2 tablets (1,300 mg total) by mouth 3 (three) times daily. 30 tablet 0   traZODone (DESYREL) 150 MG tablet Take 150 mg by mouth at bedtime.     fexofenadine-pseudoephedrine (ALLEGRA-D) 60-120 MG 12 hr tablet Take 1 tablet by mouth 2 (two) times daily. (Patient not taking: Reported on 02/10/2022) 20 tablet 0   QUEtiapine (SEROQUEL) 300 MG tablet Take 300 mg by mouth at bedtime.  (Patient not taking: Reported on 02/10/2022)     No current facility-administered medications for this visit.    OBJECTIVE: Vitals:   08/17/22 1146  BP: 102/70  Pulse: 88  Temp: 99.8 F (37.7 C)  SpO2: 100%     Body mass index is 25.19 kg/m.    ECOG FS:0 - Asymptomatic  General: Well-developed, well-nourished, no acute distress. Eyes: Pink conjunctiva, anicteric sclera. HEENT: Normocephalic, moist mucous membranes. Lungs: No audible wheezing or coughing. Heart: Regular rate and rhythm. Abdomen:  Soft, nontender, no obvious distention. Musculoskeletal: No edema, cyanosis, or clubbing. Neuro: Alert, answering all questions appropriately. Cranial nerves grossly intact. Skin: No rashes or petechiae noted. Psych: Normal affect. Lymphatics: No cervical, calvicular, axillary or inguinal LAD.   LAB RESULTS:  Lab Results  Component Value Date   NA 135 08/12/2022   K 3.5 08/12/2022   CL 106 08/12/2022   CO2 23 08/12/2022   GLUCOSE 92 08/12/2022   BUN 9 08/12/2022   CREATININE 0.78 08/12/2022   CALCIUM 9.2 08/12/2022   PROT 7.1 06/09/2022   ALBUMIN 3.5 06/09/2022   AST 22 06/09/2022   ALT 9 06/09/2022   ALKPHOS 52 06/09/2022   BILITOT 0.5 06/09/2022   GFRNONAA >60 08/12/2022   GFRAA >60 08/27/2019    Lab Results  Component Value Date   WBC 5.3 08/17/2022   NEUTROABS 2.2 06/09/2022   HGB 9.6 (L) 08/17/2022   HCT 32.4 (L) 08/17/2022   MCV 72.0 (L) 08/17/2022   PLT 391 08/17/2022   Lab Results  Component Value Date   IRON 28 06/02/2018   TIBC 636 (H) 06/02/2018   IRONPCTSAT 4 (L) 06/02/2018   Lab Results  Component Value Date   FERRITIN 4 (L) 06/02/2018     STUDIES: DG Chest 2 View  Result Date: 08/12/2022 CLINICAL DATA:  Shortness of breath EXAM: CHEST - 2 VIEW COMPARISON:  X-ray 06/08/2022 FINDINGS: The heart size and mediastinal contours are within normal limits. Both lungs are clear. No consolidation, pneumothorax or effusion. No edema. The visualized skeletal structures are unremarkable. IMPRESSION: No acute cardiopulmonary disease. Electronically Signed   By: Karen Kays M.D.   On: 08/12/2022 13:20    ASSESSMENT: Iron deficiency anemia.  PLAN:    Iron deficiency anemia: Likely secondary to heavy menses.  Patient has not produced hemoglobin.  Iron stores are pending at time of dictation.  She is also mildly symptomatic.  Return to clinic later this week for 510 mg IV Feraheme.  Patient will then return to clinic next week for a second treatment and then in  3 months for repeat laboratory work, further evaluation, and consideration of additional IV iron if necessary. Heavy menses: Patient reports gynecology has recommended hysterectomy, but she is unclear if she wishes to pursue this procedure.  I spent a total of 45 minutes reviewing chart data, face-to-face evaluation with the patient, counseling and coordination of care as detailed above.   Patient expressed understanding and was in agreement with this plan. She also understands that She can call clinic at any time with any questions, concerns, or complaints.    Jeralyn Ruths, MD   08/17/2022 1:10 PM

## 2022-08-19 ENCOUNTER — Inpatient Hospital Stay: Payer: Medicaid Other

## 2022-08-19 VITALS — BP 119/72 | HR 81 | Temp 98.8°F | Resp 20

## 2022-08-19 DIAGNOSIS — O99013 Anemia complicating pregnancy, third trimester: Secondary | ICD-10-CM

## 2022-08-19 DIAGNOSIS — D509 Iron deficiency anemia, unspecified: Secondary | ICD-10-CM | POA: Diagnosis not present

## 2022-08-19 MED ORDER — SODIUM CHLORIDE 0.9 % IV SOLN
510.0000 mg | Freq: Once | INTRAVENOUS | Status: AC
  Administered 2022-08-19: 510 mg via INTRAVENOUS
  Filled 2022-08-19: qty 510

## 2022-08-19 MED ORDER — SODIUM CHLORIDE 0.9 % IV SOLN
Freq: Once | INTRAVENOUS | Status: AC
  Filled 2022-08-19: qty 250

## 2022-08-19 NOTE — Patient Instructions (Signed)

## 2022-08-25 MED FILL — Ferumoxytol Inj 510 MG/17ML (30 MG/ML) (Elemental Fe): INTRAVENOUS | Qty: 17 | Status: AC

## 2022-08-26 ENCOUNTER — Inpatient Hospital Stay: Payer: Medicaid Other

## 2022-09-09 ENCOUNTER — Inpatient Hospital Stay: Payer: Medicaid Other

## 2022-09-09 VITALS — BP 123/74 | HR 81 | Resp 18

## 2022-09-09 DIAGNOSIS — O99013 Anemia complicating pregnancy, third trimester: Secondary | ICD-10-CM

## 2022-09-09 DIAGNOSIS — D509 Iron deficiency anemia, unspecified: Secondary | ICD-10-CM | POA: Diagnosis not present

## 2022-09-09 MED ORDER — SODIUM CHLORIDE 0.9 % IV SOLN
Freq: Once | INTRAVENOUS | Status: AC
  Filled 2022-09-09: qty 250

## 2022-09-09 MED ORDER — SODIUM CHLORIDE 0.9 % IV SOLN
510.0000 mg | Freq: Once | INTRAVENOUS | Status: AC
  Administered 2022-09-09: 510 mg via INTRAVENOUS
  Filled 2022-09-09: qty 510

## 2022-09-09 NOTE — Progress Notes (Signed)
Feraheme slowed down for patient comfort. Pt declines 30 minute post observation.

## 2022-10-12 ENCOUNTER — Other Ambulatory Visit: Payer: Self-pay

## 2022-10-12 ENCOUNTER — Emergency Department
Admission: EM | Admit: 2022-10-12 | Discharge: 2022-10-12 | Disposition: A | Discharge status: Home / Self Care | Payer: MEDICAID | Attending: Emergency Medicine | Admitting: Emergency Medicine

## 2022-10-12 ENCOUNTER — Encounter: Payer: Self-pay | Admitting: Oncology

## 2022-10-12 DIAGNOSIS — N939 Abnormal uterine and vaginal bleeding, unspecified: Secondary | ICD-10-CM | POA: Insufficient documentation

## 2022-10-12 LAB — TYPE AND SCREEN
ABO/RH(D): O POS
Antibody Screen: NEGATIVE

## 2022-10-12 LAB — BASIC METABOLIC PANEL
Anion gap: 9 (ref 5–15)
BUN: 15 mg/dL (ref 6–20)
CO2: 19 mmol/L — ABNORMAL LOW (ref 22–32)
Calcium: 9.6 mg/dL (ref 8.9–10.3)
Chloride: 109 mmol/L (ref 98–111)
Creatinine, Ser: 0.75 mg/dL (ref 0.44–1.00)
GFR, Estimated: >60 mL/min (ref >60)
Glucose, Bld: 90 mg/dL (ref 70–99)
Potassium: 3.5 mmol/L (ref 3.5–5.1)
Sodium: 137 mmol/L (ref 135–145)

## 2022-10-12 LAB — CBC
HCT: 45.4 % (ref 36.0–46.0)
Hemoglobin: 14.8 g/dL (ref 12.0–15.0)
MCH: 27.2 pg (ref 26.0–34.0)
MCHC: 32.6 g/dL (ref 30.0–36.0)
MCV: 83.3 fL (ref 80.0–100.0)
Platelets: 243 10*3/uL (ref 150–400)
RBC: 5.45 MIL/uL — ABNORMAL HIGH (ref 3.87–5.11)
WBC: 6.3 10*3/uL (ref 4.0–10.5)
nRBC: 0 % (ref 0.0–0.2)

## 2022-10-12 LAB — POC URINE PREG, ED: Preg Test, Ur: NEGATIVE

## 2022-10-12 MED ORDER — IBUPROFEN 600 MG PO TABS: 600.0000 mg | ORAL_TABLET | Freq: Four times a day (QID) | ORAL | 0 refills | Status: DC | PRN

## 2022-10-12 MED ORDER — FERROUS SULFATE 325 (65 FE) MG PO TABS
325.0000 mg | ORAL_TABLET | Freq: Once | ORAL | Status: AC
  Administered 2022-10-12: 325 mg via ORAL
  Filled 2022-10-12: qty 1

## 2022-10-12 MED ORDER — IBUPROFEN 600 MG PO TABS
600.0000 mg | ORAL_TABLET | Freq: Once | ORAL | Status: AC
  Administered 2022-10-12: 600 mg via ORAL
  Filled 2022-10-12: qty 1

## 2022-10-12 MED ORDER — FERROUS SULFATE 325 (65 FE) MG PO TBEC: 325.0000 mg | DELAYED_RELEASE_TABLET | Freq: Three times a day (TID) | ORAL | 0 refills | Status: DC

## 2022-10-12 MED ORDER — SENNA-DOCUSATE SODIUM 8.6-50 MG PO TABS: 2.0000 | ORAL_TABLET | Freq: Every day | ORAL | 1 refills | Status: DC

## 2022-10-12 NOTE — ED Triage Notes (Signed)
Pt here with vaginal bleeding x2 days. Pt gets infusions at the cancer center. Pt gets Dept shots in order to not bleed as much. Pt also having abd pain as well. Pt saw clots in her blood as well.

## 2022-10-12 NOTE — Discharge Instructions (Signed)
Please increase iron supplement to 3 times daily with food.  Increase ibuprofen to 600 mg every 6 hours with food.  Take Senokot daily twice daily.  Make sure you are drinking lots of fluids.  Follow-up with GYN if no improvement in 3 to 4 days.  Return to the ER for any worsening symptoms or any urgent changes in your health

## 2022-10-12 NOTE — ED Provider Notes (Signed)
[place] EMERGENCY DEPARTMENT AT [facility] Provider Note   CSN: [id number] Arrival date & time: 10/12/22  1753     History  Chief Complaint  Patient presents with   Vaginal Bleeding    Kristy Reid is a 27 y.o. female.  Presents to the emergency department valuation of vaginal bleeding for a couple of days.  She describes heavy vaginal bleeding with clots.  She received Depo injection a couple of weeks ago and was doing well up until the last couple days.  She describes some cramping and heavy vaginal bleeding with clotting.  She is changing a pad an every hour.  She takes iron supplements twice a day every other day for history of chronic anemia.  She has a history of blood clots in left leg a couple of years ago.  She denies any chest pain or shortness of breath.  No dizziness or lightheadedness.  Her vital signs remained stable.  HPI     Home Medications Prior to Admission medications   Medication Sig Start Date End Date Taking? Authorizing Provider  ferrous sulfate 325 (65 FE) MG EC tablet Take 1 tablet (325 mg total) by mouth 3 (three) times daily with meals. 10/12/22 10/12/23 Yes Evon Slack, PA-C  ibuprofen (ADVIL) 600 MG tablet Take 1 tablet (600 mg total) by mouth every 6 (six) hours as needed for moderate pain. With food 10/12/22  Yes Evon Slack, PA-C  sennosides-docusate sodium (SENOKOT-S) 8.6-50 MG tablet Take 2 tablets by mouth daily. 10/12/22  Yes Evon Slack, PA-C  acetaminophen (TYLENOL) 500 MG tablet Take 500-1,000 mg by mouth every 6 (six) hours as needed for mild pain or moderate pain.    [provider]  albuterol (VENTOLIN HFA) 108 (90 Base) MCG/ACT inhaler Inhale 2 puffs into the lungs every 6 (six) hours as needed for wheezing or shortness of breath.    [provider]  fluticasone (FLONASE) 50 MCG/ACT nasal spray Place 1 spray into both nostrils daily.    [provider]  hydrOXYzine (ATARAX/VISTARIL) 10 MG  tablet Take 10 mg by mouth 3 (three) times daily as needed for itching or anxiety.    [provider]  meclizine (ANTIVERT) 25 MG tablet Take 25 mg by mouth 3 (three) times daily as needed for dizziness.    [provider]  metoprolol tartrate (LOPRESSOR) 50 MG tablet Take 1.5 tablets (75 mg total) by mouth 2 (two) times daily. 10/09/19 08/17/22  Debbe Odea, MD  predniSONE (STERAPRED UNI-PAK 21 TAB) 10 MG (21) TBPK tablet Take 6 tablets by mouth at once on day 1 and decrease by 1 tablet for each subsequent day 05/17/22   Poggi, Eileen Stanford E, PA-C  QUEtiapine (SEROQUEL) 300 MG tablet Take 300 mg by mouth at bedtime.  Patient not taking: Reported on 02/10/2022    [provider]  tranexamic acid (LYSTEDA) 650 MG TABS tablet Take 2 tablets (1,300 mg total) by mouth 3 (three) times daily. 06/09/22   Ward, Layla Maw, DO  traZODone (DESYREL) 150 MG tablet Take 150 mg by mouth at bedtime.    [provider]  loratadine (CLARITIN) 10 MG tablet Take 1 tablet (10 mg total) by mouth daily. 05/05/18 08/31/19  [name], [name], MD      Allergies    Penicillins, Rocephin [ceftriaxone], Zithromax [azithromycin], and Lidocaine    Review of Systems   Review of Systems  Physical Exam Updated Vital Signs BP (!) 132/93   Pulse 79  Temp 98.5 F (36.9 C) (Oral)   Resp 18   Ht 5\' 2"  (1.575 m)   Wt 62.5 kg   SpO2 100%   BMI 25.20 kg/m  Physical Exam Constitutional:      Appearance: She is well-developed.  HENT:     Head: Normocephalic and atraumatic.  Eyes:     Conjunctiva/sclera: Conjunctivae normal.  Cardiovascular:     Rate and Rhythm: Normal rate.  Pulmonary:     Effort: Pulmonary effort is normal. No respiratory distress.  Abdominal:     General: There is no distension.     Tenderness: There is no abdominal tenderness. There is no right CVA tenderness, left CVA tenderness or guarding.  Musculoskeletal:        General: Normal range of motion.     Cervical back:  Normal range of motion.  Skin:    General: Skin is warm.     Findings: No rash.  Neurological:     General: No focal deficit present.     Mental Status: She is alert and oriented to person, place, and time. Mental status is at baseline.     Motor: No weakness.  Psychiatric:        Behavior: Behavior normal.        Thought Content: Thought content normal.     ED Results / Procedures / Treatments   Labs (all labs ordered are listed, but only abnormal results are displayed) Labs Reviewed  CBC - Abnormal; Notable for the following components:      Result Value   RBC 5.45 (*)    All other components within normal limits  BASIC METABOLIC PANEL - Abnormal; Notable for the following components:   CO2 19 (*)    All other components within normal limits  POC URINE PREG, ED  TYPE AND SCREEN  TYPE AND SCREEN    EKG None  Radiology No results found.  Procedures Procedures    Medications Ordered in ED Medications  ferrous sulfate tablet 325 mg (has no administration in time range)  ibuprofen (ADVIL) tablet 600 mg (has no administration in time range)    ED Course/ Medical Decision Making/ A&P                             Medical Decision Making Amount and/or Complexity of Data Reviewed Labs: ordered.  Risk OTC drugs. Prescription drug management.   27 year old female with vaginal bleeding.  Pregnancy test negative.  Vital signs are stable afebrile nontachycardic.  Her hemoglobin is stable at 14.8.  BMP within normal limits.  Urinalysis shows no signs of any infectious process and negative pregnancy test.  Due to history of blood clots we will hold off on medroxyprogesterone.  Will have her increase her iron supplement 3 times daily and increase ibuprofen to 600 mg every 6 hours to help with vaginal bleeding.  She is hemodynamically stable.  She understands signs and symptoms to return to the ER for.  Due to high amounts of iron will also give Senokot d to help prevent  constipation. Final Clinical Impression(s) / ED Diagnoses Final diagnoses:  Vaginal bleeding    Rx / DC Orders ED Discharge Orders          Ordered    ferrous sulfate 325 (65 FE) MG EC tablet  3 times daily with meals        10/12/22 1944    sennosides-docusate sodium (SENOKOT-S) 8.6-50 MG tablet  Daily        10/12/22 1944    ibuprofen (ADVIL) 600 MG tablet  Every 6 hours PRN        10/12/22 1944              Ronnette Juniper 10/12/22 1948    Jene Every, MD 10/12/22 2026

## 2022-11-13 ENCOUNTER — Other Ambulatory Visit: Payer: Self-pay | Admitting: *Deleted

## 2022-11-13 DIAGNOSIS — D509 Iron deficiency anemia, unspecified: Secondary | ICD-10-CM

## 2022-11-17 ENCOUNTER — Inpatient Hospital Stay: Payer: MEDICAID | Attending: Oncology

## 2022-11-17 DIAGNOSIS — D509 Iron deficiency anemia, unspecified: Secondary | ICD-10-CM | POA: Insufficient documentation

## 2022-11-19 ENCOUNTER — Inpatient Hospital Stay: Payer: MEDICAID | Admitting: Oncology

## 2022-11-19 ENCOUNTER — Inpatient Hospital Stay: Payer: MEDICAID

## 2022-11-23 ENCOUNTER — Other Ambulatory Visit: Payer: Self-pay

## 2022-11-23 ENCOUNTER — Encounter: Payer: Self-pay | Admitting: Emergency Medicine

## 2022-11-23 ENCOUNTER — Emergency Department
Admission: EM | Admit: 2022-11-23 | Discharge: 2022-11-23 | Disposition: A | Discharge status: Home / Self Care | Payer: MEDICAID | Attending: Emergency Medicine | Admitting: Emergency Medicine

## 2022-11-23 ENCOUNTER — Emergency Department: Payer: MEDICAID

## 2022-11-23 DIAGNOSIS — F17210 Nicotine dependence, cigarettes, uncomplicated: Secondary | ICD-10-CM | POA: Insufficient documentation

## 2022-11-23 DIAGNOSIS — J45909 Unspecified asthma, uncomplicated: Secondary | ICD-10-CM | POA: Diagnosis not present

## 2022-11-23 DIAGNOSIS — N939 Abnormal uterine and vaginal bleeding, unspecified: Secondary | ICD-10-CM | POA: Diagnosis present

## 2022-11-23 LAB — BASIC METABOLIC PANEL
Anion gap: 9 (ref 5–15)
BUN: 10 mg/dL (ref 6–20)
CO2: 22 mmol/L (ref 22–32)
Calcium: 9.3 mg/dL (ref 8.9–10.3)
Chloride: 106 mmol/L (ref 98–111)
Creatinine, Ser: 0.71 mg/dL (ref 0.44–1.00)
GFR, Estimated: >60 mL/min (ref >60)
Glucose, Bld: 96 mg/dL (ref 70–99)
Potassium: 3.7 mmol/L (ref 3.5–5.1)
Sodium: 137 mmol/L (ref 135–145)

## 2022-11-23 LAB — CBC
HCT: 44.7 % (ref 36.0–46.0)
Hemoglobin: 14.7 g/dL (ref 12.0–15.0)
MCH: 30.1 pg (ref 26.0–34.0)
MCHC: 32.9 g/dL (ref 30.0–36.0)
MCV: 91.4 fL (ref 80.0–100.0)
Platelets: 250 10*3/uL (ref 150–400)
RBC: 4.89 MIL/uL (ref 3.87–5.11)
RDW: 14.9 % (ref 11.5–15.5)
WBC: 5.6 10*3/uL (ref 4.0–10.5)
nRBC: 0 % (ref 0.0–0.2)

## 2022-11-23 LAB — POC URINE PREG, ED: Preg Test, Ur: POSITIVE — AB

## 2022-11-23 LAB — PREGNANCY, URINE: Preg Test, Ur: NEGATIVE

## 2022-11-23 LAB — HCG, QUANTITATIVE, PREGNANCY: hCG, Beta Chain, Quant, S: 1 m[IU]/mL (ref <5)

## 2022-11-23 NOTE — ED Triage Notes (Signed)
Patient to ED via POV for vaginal bleeding and abd cramping. Pt states it has been ongoing intermittently x6 months. States bleeding is worse today. Has taken hormones as prescribed but not helping. Suppose to get hysterectomy but hasn't yet due to not having insurance.

## 2022-11-23 NOTE — Discharge Instructions (Addendum)
You have an appointment scheduled on September 17 with Dr. Dalbert Garnet to discuss hysterectomy.  You were unable to save your ultrasound today.  Please return for any new, worsening, or change in symptoms or other concerns.  It was a pleasure caring for you.

## 2022-11-23 NOTE — Group Note (Deleted)

## 2022-11-23 NOTE — ED Provider Notes (Signed)
[facility] Provider Note    Event Date/Time   First MD Initiated Contact with Patient 11/23/22 0710     (approximate)   History   Vaginal Bleeding   HPI  Kristy Reid is a 27 y.o. female with a past medical history of borderline personality disorder, bipolar disorder, major depressive disorder, schizoaffective disorder, alcohol abuse, menorrhagia, PTSD who presents today for evaluation of vaginal bleeding.  Patient reports that this has been ongoing intermittently for the past 6 months.  Patient reports "I am tired of bleeding."  Patient reports that she has had suprapubic abdominal cramping that has been intermittent as well.  She has tried iron supplements and hormones, though when she stops these the bleeding returns.  She reports that she has had a hard time getting an appointment with OB/GYN as she was told that she needs to have an ablation.  Patient reports that she she has an appointment scheduled for 2 months from now.  Patient Active Problem List   Diagnosis Date Noted   Menorrhagia, premenopausal 02/10/2022   Menorrhagia 02/10/2022   Preterm contractions 06/03/2018   Anemia affecting pregnancy in third trimester 05/30/2018   Limited prenatal care in second trimester 05/19/2018   Depression with suicidal ideation 05/04/2018   Alcohol abuse 05/04/2018   Schizoaffective disorder, bipolar type (HCC) 05/04/2018   Anxiety disorder    Bipolar 2 disorder (HCC) 11/11/2016   Noncompliance 11/11/2016   Severe recurrent major depression with psychotic features (HCC) 07/18/2016   MDD (major depressive disorder), recurrent, severe, with psychosis (HCC) 07/17/2016   Decreased fetal movement 03/22/2016   Abdominal pain in pregnancy 01/21/2016   Constipation during pregnancy in second trimester 12/26/2015   Overdose 11/29/2015   Supervision of high risk pregnancy, antepartum 11/15/2015   Cannabis use disorder, severe, dependence (HCC) 11/15/2015    Tobacco use disorder 11/15/2015   Asthma 11/15/2015   Borderline personality disorder (HCC) 11/15/2015   PTSD (post-traumatic stress disorder) 11/15/2015   Severe recurrent major depression without psychotic features (HCC) 11/13/2015          Physical Exam   Triage Vital Signs: ED Triage Vitals  Encounter Vitals Group     BP 11/23/22 0705 122/79     Systolic BP Percentile --      Diastolic BP Percentile --      Pulse Rate 11/23/22 0705 79     Resp 11/23/22 0705 18     Temp 11/23/22 0705 98.7 F (37.1 C)     Temp Source 11/23/22 0705 Oral     SpO2 11/23/22 0705 99 %     Weight 11/23/22 0706 140 lb (63.5 kg)     Height 11/23/22 0706 5\' 2"  (1.575 m)     Head Circumference --      Peak Flow --      Pain Score 11/23/22 0705 8     Pain Loc --      Pain Education --      Exclude from Growth Chart --     Most recent vital signs: Vitals:   11/23/22 0705  BP: 122/79  Pulse: 79  Resp: 18  Temp: 98.7 F (37.1 C)  SpO2: 99%    Physical Exam Vitals and nursing note reviewed.  Constitutional:      General: Awake and alert. No acute distress.    Appearance: Normal appearance. The patient is normal weight.  HENT:     Head: Normocephalic and atraumatic.     Mouth: Mucous membranes  are moist.  Eyes:     General: PERRL. Normal EOMs        Right eye: No discharge.        Left eye: No discharge.     Conjunctiva/sclera: Conjunctivae normal.  Cardiovascular:     Rate and Rhythm: Normal rate and regular rhythm.     Pulses: Normal pulses.     Heart sounds: Normal heart sounds Pulmonary:     Effort: Pulmonary effort is normal. No respiratory distress.     Breath sounds: Normal breath sounds.  Abdominal:     Abdomen is soft. There is no abdominal tenderness. No rebound or guarding. No distention. Musculoskeletal:        General: No swelling. Normal range of motion.     Cervical back: Normal range of motion and neck supple.  Skin:    General: Skin is warm and dry.      Capillary Refill: Capillary refill takes less than 2 seconds.     Findings: No rash.  Neurological:     Mental Status: The patient is awake and alert.      ED Results / Procedures / Treatments   Labs (all labs ordered are listed, but only abnormal results are displayed) Labs Reviewed  POC URINE PREG, ED - Abnormal; Notable for the following components:      Result Value   Preg Test, Ur POSITIVE (*)    All other components within normal limits  CBC  BASIC METABOLIC PANEL  PREGNANCY, URINE  HCG, QUANTITATIVE, PREGNANCY     EKG     RADIOLOGY     PROCEDURES:  Critical Care performed:   Procedures   MEDICATIONS ORDERED IN ED: Medications - No data to display   IMPRESSION / MDM / ASSESSMENT AND PLAN / ED COURSE  I reviewed the triage vital signs and the nursing notes.   Differential diagnosis includes, but is not limited to, fibroids, anemia, painful menses, hormone imbalance, cyst.  I reviewed the patient's chart.  Patient has been seen in this emergency department with the same complaint multiple times.  She has an appointment scheduled on 12/01/2022 with Dr. Dalbert Garnet to discuss hysterectomy.  Patient is awake and alert, hemodynamically stable and afebrile.  Her H&H is stable, similar to previous, no need for transfusion.  The point-of-care urine pregnancy test was accidentally inputted as positive by the tech, though this was actually negative and I was able to confirm this with only 1 lying on a pregnancy test.  However, this was further confirmed by sending the urine to the lab for pregnancy test and this was negative.  I recommended ultrasound for further evaluation of her bleeding, the patient did not wish to wait for the ultrasound and therefore she requested to be discharged.  I pointed out that I was able to see that she has an appointment on Tuesday and she is relieved that she does not have to wait several months.  She agrees to go to this appointment.  We  discussed return precautions and outpatient follow-up.  Patient understands and agrees with plan.  She was discharged in stable condition.  Patient's presentation is most consistent with acute complicated illness / injury requiring diagnostic workup.  Clinical Course as of 11/23/22 1610  [facility] Nov 23, 2022  9604 Preg Test, Ur(!): POSITIVE Tech assures me this was negative and she is unable to change in in the system. She showed me the POC stick with only 1 line. Urine also sent to lab and  is NEGATIVE for pregnancy [JP]    Clinical Course User Index [JP] Marc Leichter, Herb Grays, PA-C     FINAL CLINICAL IMPRESSION(S) / ED DIAGNOSES   Final diagnoses:  Vaginal bleeding     Rx / DC Orders   ED Discharge Orders     None        Note:  This document was prepared using Dragon voice recognition software and may include unintentional dictation errors.   Keturah Shavers 11/23/22 [id number]    Pilar Jarvis, MD 11/23/22 0930

## 2022-11-23 NOTE — ED Notes (Signed)
Pt took Pregnancy test which was "Negative"during the cross over its showing positive. Has made Nurse and provider aware as well as lab. Thanks

## 2022-11-26 ENCOUNTER — Other Ambulatory Visit: Payer: Self-pay | Admitting: Nurse Practitioner

## 2022-11-26 DIAGNOSIS — M79672 Pain in left foot: Secondary | ICD-10-CM

## 2022-11-26 DIAGNOSIS — N946 Dysmenorrhea, unspecified: Secondary | ICD-10-CM

## 2022-11-30 ENCOUNTER — Emergency Department
Admission: EM | Admit: 2022-11-30 | Discharge: 2022-11-30 | Disposition: A | Discharge status: Home / Self Care | Payer: MEDICAID | Attending: Emergency Medicine | Admitting: Emergency Medicine

## 2022-11-30 ENCOUNTER — Other Ambulatory Visit: Payer: Self-pay

## 2022-11-30 ENCOUNTER — Encounter: Payer: Self-pay | Admitting: Emergency Medicine

## 2022-11-30 DIAGNOSIS — R067 Sneezing: Secondary | ICD-10-CM | POA: Diagnosis not present

## 2022-11-30 DIAGNOSIS — Z20822 Contact with and (suspected) exposure to covid-19: Secondary | ICD-10-CM | POA: Insufficient documentation

## 2022-11-30 DIAGNOSIS — R059 Cough, unspecified: Secondary | ICD-10-CM | POA: Insufficient documentation

## 2022-11-30 DIAGNOSIS — J45909 Unspecified asthma, uncomplicated: Secondary | ICD-10-CM | POA: Insufficient documentation

## 2022-11-30 DIAGNOSIS — J029 Acute pharyngitis, unspecified: Secondary | ICD-10-CM | POA: Diagnosis present

## 2022-11-30 LAB — RESP PANEL BY RT-PCR (RSV, FLU A&B, COVID)  RVPGX2
Influenza A by PCR: NEGATIVE
Influenza B by PCR: NEGATIVE
Resp Syncytial Virus by PCR: NEGATIVE
SARS Coronavirus 2 by RT PCR: NEGATIVE

## 2022-11-30 LAB — GROUP A STREP BY PCR: Group A Strep by PCR: NOT DETECTED

## 2022-11-30 NOTE — ED Triage Notes (Signed)
Patient to ED via POV for sore throat. PT states she has also lost her voice, coughing, sneezing, body aches and hot flashes. Started yesterday.

## 2022-11-30 NOTE — Discharge Instructions (Signed)
Your COVID and strep test were negative.  Please continue to take Tylenol/ibuprofen and drink plenty of fluids.  Please return for any new, worsening, or change in symptoms or other concerns.  It was a pleasure caring for you today.

## 2022-11-30 NOTE — ED Provider Notes (Signed)
[facility] Provider Note    Event Date/Time   First MD Initiated Contact with Patient 11/30/22 1038     (approximate)   History   Sore Throat   HPI  Kristy Reid is a 27 y.o. female who presents today for evaluation of sore throat.  Patient reports that this began yesterday.  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  She is unsure of any specific sick contacts.  No fevers or chills.  She reports that she is also coughing and sneezing.  No trouble swallowing, though does report that she has pain with swallowing.  Patient Active Problem List   Diagnosis Date Noted   Menorrhagia, premenopausal 02/10/2022   Menorrhagia 02/10/2022   Preterm contractions 06/03/2018   Anemia affecting pregnancy in third trimester 05/30/2018   Limited prenatal care in second trimester 05/19/2018   Depression with suicidal ideation 05/04/2018   Alcohol abuse 05/04/2018   Schizoaffective disorder, bipolar type (HCC) 05/04/2018   Anxiety disorder    Bipolar 2 disorder (HCC) 11/11/2016   Noncompliance 11/11/2016   Severe recurrent major depression with psychotic features (HCC) 07/18/2016   MDD (major depressive disorder), recurrent, severe, with psychosis (HCC) 07/17/2016   Decreased fetal movement 03/22/2016   Abdominal pain in pregnancy 01/21/2016   Constipation during pregnancy in second trimester 12/26/2015   Overdose 11/29/2015   Supervision of high risk pregnancy, antepartum 11/15/2015   Cannabis use disorder, severe, dependence (HCC) 11/15/2015   Tobacco use disorder 11/15/2015   Asthma 11/15/2015   Borderline personality disorder (HCC) 11/15/2015   PTSD (post-traumatic stress disorder) 11/15/2015   Severe recurrent major depression without psychotic features (HCC) 11/13/2015          Physical Exam   Triage Vital Signs: ED Triage Vitals [11/30/22 1018]  Encounter Vitals Group     BP 120/63     Systolic BP  Percentile      Diastolic BP Percentile      Pulse Rate 90     Resp 18     Temp 98.5 F (36.9 C)     Temp Source Oral     SpO2 99 %     Weight 145 lb (65.8 kg)     Height 5\' 2"  (1.575 m)     Head Circumference      Peak Flow      Pain Score 7     Pain Loc      Pain Education      Exclude from Growth Chart     Most recent vital signs: Vitals:   11/30/22 1018  BP: 120/63  Pulse: 90  Resp: 18  Temp: 98.5 F (36.9 C)  SpO2: 99%    Physical Exam Vitals and nursing note reviewed.  Constitutional:      General: Awake and alert. No acute distress.    Appearance: Normal appearance. The patient is normal weight.  HENT:     Head: Normocephalic and atraumatic.     Mouth: Mucous membranes are moist. Uvula midline.  No tonsillar exudate.  No soft palate fluctuance.  No trismus.  No hot potato voice though she does have a raspy voice.  No sublingual swelling.  No tender cervical lymphadenopathy.  No nuchal rigidity Eyes:     General: PERRL. Normal EOMs        Right eye: No discharge.        Left eye: No discharge.  Conjunctiva/sclera: Conjunctivae normal.  Cardiovascular:     Rate and Rhythm: Normal rate and regular rhythm.     Pulses: Normal pulses.  Pulmonary:     Effort: Pulmonary effort is normal. No respiratory distress.     Breath sounds: Normal breath sounds.  Abdominal:     Abdomen is soft. There is no abdominal tenderness. No rebound or guarding. No distention. Musculoskeletal:        General: No swelling. Normal range of motion.     Cervical back: Normal range of motion and neck supple.  Skin:    General: Skin is warm and dry.     Capillary Refill: Capillary refill takes less than 2 seconds.     Findings: No rash.  Neurological:     Mental Status: The patient is awake and alert.      ED Results / Procedures / Treatments   Labs (all labs ordered are listed, but only abnormal results are displayed) Labs Reviewed  RESP PANEL BY RT-PCR (RSV, FLU A&B,  COVID)  [id number]  GROUP A STREP BY PCR     EKG     RADIOLOGY     PROCEDURES:  Critical Care performed:   Procedures   MEDICATIONS ORDERED IN ED: Medications - No data to display   IMPRESSION / MDM / ASSESSMENT AND PLAN / ED COURSE  I reviewed the triage vital signs and the nursing notes.   Differential diagnosis includes, but is not limited to, strep pharyngitis, COVID-19, viral pharyngitis.  Patient is awake and alert, hemodynamically stable and afebrile.  No uvular deviation, no hot potato voice, no drooling, no trismus, no neck pain, not consistent with peritonsillar or retropharyngeal abscess.  COVID and strep test are negative.  I suspect other viral pharyngitis.  We discussed symptomatic management and return precautions.  Patient understands and agrees with plan.  She was discharged in stable condition.  Patient's presentation is most consistent with acute complicated illness / injury requiring diagnostic workup.      FINAL CLINICAL IMPRESSION(S) / ED DIAGNOSES   Final diagnoses:  Pharyngitis, unspecified etiology     Rx / DC Orders   ED Discharge Orders     None        Note:  This document was prepared using Dragon voice recognition software and may include unintentional dictation errors.   Keturah Shavers 11/30/22 1504    Jene Every, MD 11/30/22 [phone]

## 2022-12-02 ENCOUNTER — Other Ambulatory Visit: Payer: Self-pay | Admitting: Nurse Practitioner

## 2022-12-02 ENCOUNTER — Ambulatory Visit
Admission: RE | Admit: 2022-12-02 | Discharge: 2022-12-02 | Disposition: A | Discharge status: Home / Self Care | Payer: MEDICAID | Source: Ambulatory Visit | Attending: Nurse Practitioner | Admitting: Nurse Practitioner

## 2022-12-02 ENCOUNTER — Ambulatory Visit
Admission: RE | Admit: 2022-12-02 | Discharge: 2022-12-02 | Disposition: A | Discharge status: Home / Self Care | Payer: MEDICAID | Source: Ambulatory Visit | Attending: Nurse Practitioner

## 2022-12-02 DIAGNOSIS — N946 Dysmenorrhea, unspecified: Secondary | ICD-10-CM | POA: Insufficient documentation

## 2022-12-02 DIAGNOSIS — M79672 Pain in left foot: Secondary | ICD-10-CM | POA: Diagnosis present

## 2022-12-04 ENCOUNTER — Inpatient Hospital Stay: Payer: MEDICAID

## 2022-12-04 DIAGNOSIS — D509 Iron deficiency anemia, unspecified: Secondary | ICD-10-CM | POA: Diagnosis present

## 2022-12-04 LAB — IRON AND TIBC
Iron: 63 ug/dL (ref 28–170)
Saturation Ratios: 16 % (ref 10.4–31.8)
TIBC: 393 ug/dL (ref 250–450)
UIBC: 330 ug/dL

## 2022-12-04 LAB — CBC WITH DIFFERENTIAL/PLATELET
Abs Immature Granulocytes: 0.02 10*3/uL (ref 0.00–0.07)
Basophils Absolute: 0 10*3/uL (ref 0.0–0.1)
Basophils Relative: 0 %
Eosinophils Absolute: 0 10*3/uL (ref 0.0–0.5)
Eosinophils Relative: 0 %
HCT: 41.6 % (ref 36.0–46.0)
Hemoglobin: 13.9 g/dL (ref 12.0–15.0)
Immature Granulocytes: 0 %
Lymphocytes Relative: 26 %
Lymphs Abs: 1.8 10*3/uL (ref 0.7–4.0)
MCH: 30.7 pg (ref 26.0–34.0)
MCHC: 33.4 g/dL (ref 30.0–36.0)
MCV: 91.8 fL (ref 80.0–100.0)
Monocytes Absolute: 0.7 10*3/uL (ref 0.1–1.0)
Monocytes Relative: 10 %
Neutro Abs: 4.3 10*3/uL (ref 1.7–7.7)
Neutrophils Relative %: 64 %
Platelets: 239 10*3/uL (ref 150–400)
RBC: 4.53 MIL/uL (ref 3.87–5.11)
RDW: 13.5 % (ref 11.5–15.5)
WBC: 6.8 10*3/uL (ref 4.0–10.5)
nRBC: 0 % (ref 0.0–0.2)

## 2022-12-04 LAB — FERRITIN: Ferritin: 41 ng/mL (ref 11–307)

## 2022-12-08 ENCOUNTER — Emergency Department
Admission: EM | Admit: 2022-12-08 | Discharge: 2022-12-08 | Disposition: A | Discharge status: Home / Self Care | Payer: MEDICAID | Attending: Emergency Medicine | Admitting: Emergency Medicine

## 2022-12-08 ENCOUNTER — Emergency Department: Payer: MEDICAID

## 2022-12-08 DIAGNOSIS — N939 Abnormal uterine and vaginal bleeding, unspecified: Secondary | ICD-10-CM

## 2022-12-08 DIAGNOSIS — J181 Lobar pneumonia, unspecified organism: Secondary | ICD-10-CM | POA: Diagnosis not present

## 2022-12-08 DIAGNOSIS — R059 Cough, unspecified: Secondary | ICD-10-CM | POA: Diagnosis present

## 2022-12-08 DIAGNOSIS — Z1152 Encounter for screening for COVID-19: Secondary | ICD-10-CM | POA: Diagnosis not present

## 2022-12-08 DIAGNOSIS — J189 Pneumonia, unspecified organism: Secondary | ICD-10-CM

## 2022-12-08 DIAGNOSIS — R112 Nausea with vomiting, unspecified: Secondary | ICD-10-CM

## 2022-12-08 DIAGNOSIS — J45909 Unspecified asthma, uncomplicated: Secondary | ICD-10-CM | POA: Diagnosis not present

## 2022-12-08 LAB — BASIC METABOLIC PANEL
Anion gap: 8 (ref 5–15)
BUN: 14 mg/dL (ref 6–20)
CO2: 23 mmol/L (ref 22–32)
Calcium: 9.4 mg/dL (ref 8.9–10.3)
Chloride: 107 mmol/L (ref 98–111)
Creatinine, Ser: 0.8 mg/dL (ref 0.44–1.00)
GFR, Estimated: >60 mL/min (ref >60)
Glucose, Bld: 100 mg/dL — ABNORMAL HIGH (ref 70–99)
Potassium: 3.6 mmol/L (ref 3.5–5.1)
Sodium: 138 mmol/L (ref 135–145)

## 2022-12-08 LAB — CBC
HCT: 44.3 % (ref 36.0–46.0)
Hemoglobin: 14.7 g/dL (ref 12.0–15.0)
MCH: 30.2 pg (ref 26.0–34.0)
MCHC: 33.2 g/dL (ref 30.0–36.0)
MCV: 91.2 fL (ref 80.0–100.0)
Platelets: 263 10*3/uL (ref 150–400)
RBC: 4.86 MIL/uL (ref 3.87–5.11)
RDW: 12.7 % (ref 11.5–15.5)
WBC: 6.5 10*3/uL (ref 4.0–10.5)
nRBC: 0 % (ref 0.0–0.2)

## 2022-12-08 LAB — SARS CORONAVIRUS 2 BY RT PCR: SARS Coronavirus 2 by RT PCR: NEGATIVE

## 2022-12-08 LAB — TROPONIN I (HIGH SENSITIVITY): Troponin I (High Sensitivity): <2 ng/L (ref <18)

## 2022-12-08 MED ORDER — DOXYCYCLINE HYCLATE 100 MG PO TABS: 100.0000 mg | ORAL_TABLET | Freq: Two times a day (BID) | ORAL | 0 refills | Status: DC

## 2022-12-08 MED ORDER — MEDROXYPROGESTERONE ACETATE 5 MG PO TABS
5.0000 mg | ORAL_TABLET | Freq: Every day | ORAL | 0 refills | Status: DC
Start: 2022-12-08 — End: 2022-12-31

## 2022-12-08 MED ORDER — PROMETHAZINE HCL 25 MG PO TABS: 25.0000 mg | ORAL_TABLET | Freq: Four times a day (QID) | ORAL | 0 refills | Status: DC | PRN

## 2022-12-08 NOTE — ED Triage Notes (Signed)
Pt c/o URI symptoms including cough, malaise, fever, headache, congestion, sore throat for two days. Pt also c/o CP. Pt reports temp of 102 earlier today and had tylenol at 1000. Pt reports heavy vaginal bleeding over the last two days requiring q1h tampon changes.

## 2022-12-08 NOTE — ED Provider Notes (Signed)
[facility] Provider Note    Event Date/Time   First MD Initiated Contact with Patient 12/08/22 1628     (approximate)   History   URI   HPI  Kristy Reid is a 27 y.o. female history of asthma, anemia, chronic back pain presents emergency department complaining of cough and congestion, feeling sick for about 2 to 3 days.  Also states is felt achy for about 3 weeks.  Nausea and vomiting which Zofran is not helping.  Also is having heavy vaginal bleeding.  Is awaiting a hysterectomy.      Physical Exam   Triage Vital Signs: ED Triage Vitals [12/08/22 1439]  Encounter Vitals Group     BP 124/80     Systolic BP Percentile      Diastolic BP Percentile      Pulse Rate 99     Resp 16     Temp 99.1 F (37.3 C)     Temp src      SpO2 97 %     Weight      Height      Head Circumference      Peak Flow      Pain Score 6     Pain Loc      Pain Education      Exclude from Growth Chart     Most recent vital signs: Vitals:   12/08/22 1439  BP: 124/80  Pulse: 99  Resp: 16  Temp: 99.1 F (37.3 C)  SpO2: 97%     General: Awake, no distress.   CV:  Good peripheral perfusion. regular rate and  rhythm Resp:  Normal effort. Lungs cta Abd:  No distention.  Abdomen nontender Other:  ENT, throat is normal, neck supple, no lymphadenopathy   ED Results / Procedures / Treatments   Labs (all labs ordered are listed, but only abnormal results are displayed) Labs Reviewed  BASIC METABOLIC PANEL - Abnormal; Notable for the following components:      Result Value   Glucose, Bld 100 (*)    All other components within normal limits  SARS CORONAVIRUS 2 BY RT PCR  CBC  POC URINE PREG, ED  TROPONIN I (HIGH SENSITIVITY)  TROPONIN I (HIGH SENSITIVITY)     EKG     RADIOLOGY Chest x-ray    PROCEDURES:   Procedures   MEDICATIONS ORDERED IN ED: Medications - No data to display   IMPRESSION / MDM / ASSESSMENT AND PLAN / ED COURSE   I reviewed the triage vital signs and the nursing notes.                              Differential diagnosis includes, but is not limited to, CAP, COVID, influenza, gastroenteritis, abnormal uterine bleeding, viral illness  Patient's presentation is most consistent with acute illness / injury with system symptoms.   Patient's labs are reassuring  Chest x-ray was independently reviewed interpreted by me.  Radiologist states negative however feel that 1 area in her right midlung is a little hazy so we will treat her for CAP.  She was given a prescription for doxycycline, Phenergan for nausea and vomiting, and Provera for heavy vaginal bleeding.  Patient is in agreement treatment plan.  She is given a work note and discharged stable condition.      FINAL CLINICAL IMPRESSION(S) / ED DIAGNOSES   Final diagnoses:  Community acquired pneumonia of right  middle lobe of lung  Vaginal bleeding  Nausea and vomiting, unspecified vomiting type     Rx / DC Orders   ED Discharge Orders          Ordered    doxycycline (VIBRA-TABS) 100 MG tablet  2 times daily        12/08/22 1654    promethazine (PHENERGAN) 25 MG tablet  Every 6 hours PRN        12/08/22 1654    medroxyPROGESTERone (PROVERA) 5 MG tablet  Daily        12/08/22 1654             Note:  This document was prepared using Dragon voice recognition software and may include unintentional dictation errors.    Faythe Ghee, PA-C 12/08/22 1704    Janith Lima, MD 12/08/22 [phone]

## 2022-12-22 NOTE — H&P (Signed)
Kristy Reid is a 27 y.o. female presenting with Pre Op Consulting   History of Present Illness: Patient returns today for evaluation of heavy menstrual bleeding. She also has iron deficient anemia and has had recent iron infusions for this. 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 Estrogen has worked the best for bleeding control, but with her past DVT will not continue.   Workup: Pap: today, last not on file   EMBx: deferred    TVUS 12/02/22 Uterus 8.4 x 4.0 x 5.1 cm  Endometrium 4 mm  Bilateral ovaries WNL    Past Medical History:  has a past medical history of Anemia, Anxiety, Asthma, unspecified asthma severity, unspecified whether complicated, unspecified whether persistent (HHS-HCC), Bipolar 2 disorder (CMS/HHS-HCC), Borderline personality disorder (CMS/HHS-HCC), Cannabis use disorder, severe, dependence (CMS/HHS-HCC), Hepatic lesion, Hypertension, MDD (major depressive disorder), recurrent, severe, with psychosis (CMS/HHS-HCC), Menorrhagia, PTSD (post-traumatic stress disorder), and Schizoaffective disorder (CMS/HHS-HCC).  Past Surgical History:  has a past surgical history that includes bunionectomy and wrist surgery. Family History: family history includes Diabetes in her maternal grandmother and mother; Heart disease in her maternal grandmother; High blood pressure (Hypertension) in her mother; Mental illness in her mother. Social History:  reports that she has never smoked. She has never used smokeless tobacco. She reports that she does not currently use alcohol. She reports current drug use. Drug: Marijuana. OB/GYN History:  OB History       Gravida 3   Para 3   Term 2   Preterm 1   AB     Living 3       SAB     IAB     Ectopic     Molar     Multiple     Live Births          Allergies: is allergic to azithromycin, ceftriaxone, lidocaine, and  penicillins. Medications: Current Medications Current Outpatient Medications:    acetaminophen (TYLENOL) 500 MG tablet, Take by mouth, Disp: , Rfl:    albuterol MDI, PROVENTIL, VENTOLIN, PROAIR, HFA 90 mcg/actuation inhaler, Inhale 2 Inhalations into the lungs every 6 (six) hours as needed, Disp: , Rfl:    ferrous sulfate 325 (65 FE) MG EC tablet, Take 325 mg by mouth 3 (three) times daily with meals, Disp: , Rfl:    fluticasone propionate (FLONASE) 50 mcg/actuation nasal spray, Place 1 spray into one nostril once daily, Disp: , Rfl:    hydrOXYzine (ATARAX) 25 MG tablet, Take by mouth every 6 (six) hours as needed, Disp: , Rfl:    ibuprofen (MOTRIN) 600 MG tablet, Take by mouth, Disp: , Rfl:    meclizine (ANTIVERT) 25 mg tablet, Take 25 mg by mouth 3 (three) times daily as needed, Disp: , Rfl:    medroxyPROGESTERone (DEPO-PROVERA) 150 mg/mL injection, , Disp: , Rfl:    meloxicam (MOBIC) 7.5 MG tablet, , Disp: , Rfl:    metoprolol tartrate (LOPRESSOR) 25 MG tablet, , Disp: , Rfl:    sennosides-docusate (SENOKOT-S) 8.6-50 mg tablet, Take 2 tablets by mouth once daily, Disp: , Rfl:    hydrOXYzine HCL (ATARAX) 10 MG tablet, Take by mouth, Disp: , Rfl:    predniSONE (DELTASONE) 10 mg tablet pack, Take 6 tablets by mouth at once on day 1 and decrease by 1 tablet for each subsequent day (Patient not taking: Reported on 12/01/2022), Disp: , Rfl:    QUEtiapine (SEROQUEL) 300 MG tablet, Take  300 mg by mouth at bedtime (Patient not taking: Reported on 12/01/2022), Disp: , Rfl:    tranexamic acid (LYSTEDA) 650 mg tablet, Take 1,300 mg by mouth 3 (three) times daily (Patient not taking: Reported on 12/01/2022), Disp: , Rfl:    traZODone (DESYREL) 150 MG tablet, Take 150 mg by mouth at bedtime (Patient not taking: Reported on 12/01/2022), Disp: , Rfl:     Review of Systems: No SOB, no palpitations or chest pain, no new lower extremity edema, no nausea or vomiting or bowel or bladder complaints. See HPI for gyn  specific ROS.    Exam:   BP 94/65   Pulse 101   Ht 157.5 cm (5\' 2" )   Wt 69 kg (152 lb 3.2 oz)   BMI 27.84 kg/m      Constitutional:  General appearance: Well nourished, well developed female in no acute distress.  Neuro/psych:  Normal mood and affect. No gross motor deficits. Neck:  Supple, normal appearance.  Respiratory:  Normal respiratory effort, no use of accessory muscles Skin:  No visible rashes or external lesions    Pelvic: tanner stage 5 ,              External genitalia: vulva /labia no lesions             Urethra: no prolapse             Vagina: normal physiologic d/c,             Cervix: visualization incomplete d/t pt discomfort    Blind Pap collected today    Andree Moro was present as chaperone for the more sensitive portions of the physical exam ( such as breast and pelvic)    Chaperone present for pelvic exam.     Impression:   The primary encounter diagnosis was Abnormal uterine bleeding (AUB). Diagnoses of Excessive or frequent menstruation and Iron deficiency anemia due to chronic blood loss were also pertinent to this visit.   Plan:   1. AUB, Menorrhagia  -Patient returns for a preoperative discussion regarding her plans to proceed with surgical treatment of her AUB by RA total laparoscopic hysterectomy with bilateral salpingectomy procedure.  We may perform a cystoscopy to evaluate the urinary tract after the procedure, if surgically indicated for uro tract integrity.    The patient and I discussed the technical aspects of the procedure including the potential for risks and complications.  These include but are not limited to the risk of infection requiring post-operative antibiotics or further procedures. We talked about the risk of injury to adjacent organs including bladder, bowel, ureter, blood vessels or nerves. We talked about the need to convert to an open incision. We talked about the possible need for blood transfusion.  We talked about postop  complications such as thromboembolic or cardiopulmonary complications.  All of her questions were answered.  Her preoperative exam was completed and the appropriate consents were signed. She is scheduled to undergo this procedure in the near future.   2. Dyspareunia, Pelvic floor myalgia  We briefly discussed PFPT and recommended this for her specifically postop recovery.      Diagnoses and all orders for this visit:   Abnormal uterine bleeding (AUB)   Excessive or frequent menstruation   Iron deficiency anemia due to chronic blood loss

## 2022-12-31 ENCOUNTER — Other Ambulatory Visit: Payer: Self-pay

## 2022-12-31 ENCOUNTER — Encounter
Admission: RE | Admit: 2022-12-31 | Discharge: 2022-12-31 | Disposition: A | Discharge status: Home / Self Care | Payer: MEDICAID | Source: Ambulatory Visit | Attending: Obstetrics and Gynecology | Admitting: Obstetrics and Gynecology

## 2022-12-31 VITALS — Ht 62.0 in | Wt 152.0 lb

## 2022-12-31 DIAGNOSIS — Z01818 Encounter for other preprocedural examination: Secondary | ICD-10-CM

## 2022-12-31 HISTORY — DX: Acute embolism and thrombosis of unspecified deep veins of unspecified lower extremity: I82.409

## 2022-12-31 HISTORY — DX: Pneumonia, unspecified organism: J18.9

## 2022-12-31 HISTORY — DX: COVID-19: U07.1

## 2022-12-31 NOTE — Patient Instructions (Signed)
Your procedure is scheduled on: Friday 01/08/23 To find out your arrival time, please call [phone] between 1PM - 3PM on:   Thursday 01/07/23 Report to the Registration Desk on the 1st floor of the [facility]. Free Valet parking is available.  If your arrival time is 6:00 am, do not arrive before that time as the [facility] entrance doors do not open until 6:00 am.  REMEMBER: Instructions that are not followed completely may result in serious medical risk, up to and including death; or upon the discretion of your surgeon and anesthesiologist your surgery may need to be rescheduled.  Do not eat food after midnight the night before surgery.  No gum chewing or hard candies.  You may however, drink CLEAR liquids up to 2 hours before you are scheduled to arrive for your surgery. Do not drink anything within 2 hours of your scheduled arrival time.  Clear liquids include: - water  - apple juice without pulp - gatorade (not RED colors) - black coffee or tea (Do NOT add milk or creamers to the coffee or tea) Do NOT drink anything that is not on this list.  One week prior to surgery: Stop Anti-inflammatories (NSAIDS) such as Advil, Aleve, Ibuprofen, Motrin, Naproxen, Naprosyn and Aspirin based products such as Excedrin, Goody's Powder, BC Powder. Stop also Meloxicam (7 days) You may however, continue to take Tylenol if needed for pain up until the day of surgery.  Stop ANY OVER THE COUNTER supplements until after surgery. Continue your iron  Continue taking all prescribed medications.   TAKE ONLY THESE MEDICATIONS THE MORNING OF SURGERY WITH A SIP OF WATER:  Metoprolol  Use inhalers on the day of surgery and bring to the hospital. Flovent and Nebulizer, Bring the Albuterol  No Alcohol for 24 hours before or after surgery.  No Smoking including e-cigarettes for 24 hours before surgery.  No chewable tobacco products for at least 6 hours before surgery.  No nicotine patches on  the day of surgery.  Do not use any "recreational" drugs for at least a week (preferably 2 weeks) before your surgery.  Please be advised that the combination of cocaine and anesthesia may have negative outcomes, up to and including death. If you test positive for cocaine, your surgery will be cancelled.  On the morning of surgery brush your teeth with toothpaste and water, you may rinse your mouth with mouthwash if you wish. Do not swallow any toothpaste or mouthwash.  Use CHG Soap or wipes as directed on instruction sheet.  Do not wear lotions, powders, or perfumes. May use deodorant  Do not shave body hair from the neck down 48 hours before surgery.  Wear comfortable clothing (specific to your surgery type) to the hospital.  Do not wear jewelry, make-up, hairpins, clips or nail polish.  For welded (permanent) jewelry: bracelets, anklets, waist bands, etc.  Please have this removed prior to surgery.  If it is not removed, there is a chance that hospital personnel will need to cut it off on the day of surgery. Contact lenses, hearing aids and dentures may not be worn into surgery.  Do not bring valuables to the hospital. [facility] is not responsible for any missing/lost belongings or valuables.   Notify your doctor if there is any change in your medical condition (cold, fever, infection).  If you are being discharged the day of surgery, you will not be allowed to drive home. You will need a responsible individual to drive you  home and stay with you for 24 hours after surgery.   If you are taking public transportation, you will need to have a responsible individual with you.  If you are being admitted to the hospital overnight, leave your suitcase in the car. After surgery it may be brought to your room.  In case of increased patient census, it may be necessary for you, the patient, to continue your postoperative care in the Same Day Surgery department.  After surgery, you can  help prevent lung complications by doing breathing exercises.  Take deep breaths and cough every 1-2 hours. Your doctor may order a device called an Incentive Spirometer to help you take deep breaths. When coughing or sneezing, hold a pillow firmly against your incision with both hands. This is called "splinting." Doing this helps protect your incision. It also decreases belly discomfort.  Surgery Visitation Policy:  Patients undergoing a surgery or procedure may have two family members or support persons with them as long as the person is not COVID-19 positive or experiencing its symptoms.   Inpatient Visitation:    Visiting hours are 7 a.m. to 8 p.m. Up to four visitors are allowed at one time in a patient room. The visitors may rotate out with other people during the day. One designated support person (adult) may remain overnight.  Please call the Pre-admissions Testing Dept. at [phone] if you have any questions about these instructions.     Preparing for Surgery with CHLORHEXIDINE GLUCONATE (CHG) Soap  Chlorhexidine Gluconate (CHG) Soap  o An antiseptic cleaner that kills germs and bonds with the skin to continue killing germs even after washing  o Used for showering the night before surgery and morning of surgery  Before surgery, you can play an important role by reducing the number of germs on your skin.  CHG (Chlorhexidine gluconate) soap is an antiseptic cleanser which kills germs and bonds with the skin to continue killing germs even after washing.  Please do not use if you have an allergy to CHG or antibacterial soaps. If your skin becomes reddened/irritated stop using the CHG.  1. Shower the NIGHT BEFORE SURGERY and the MORNING OF SURGERY with CHG soap.  2. If you choose to wash your hair, wash your hair first as usual with your normal shampoo.  3. After shampooing, rinse your hair and body thoroughly to remove the shampoo.  4. Use CHG as you would any other  liquid soap. You can apply CHG directly to the skin and wash gently with a scrungie or a clean washcloth.  5. Apply the CHG soap to your body only from the neck down. Do not use on open wounds or open sores. Avoid contact with your eyes, ears, mouth, and genitals (private parts). Wash face and genitals (private parts) with your normal soap.  6. Wash thoroughly, paying special attention to the area where your surgery will be performed.  7. Thoroughly rinse your body with warm water.  8. Do not shower/wash with your normal soap after using and rinsing off the CHG soap.  9. Pat yourself dry with a clean towel.  10. Wear clean pajamas to bed the night before surgery.  12. Place clean sheets on your bed the night of your first shower and do not sleep with pets.  13. Shower again with the CHG soap on the day of surgery prior to arriving at the hospital.  14. Do not apply any deodorants/lotions/powders.  15. Please wear clean clothes to the  hospital.

## 2023-01-01 ENCOUNTER — Encounter
Admission: RE | Admit: 2023-01-01 | Discharge: 2023-01-01 | Disposition: A | Discharge status: Home / Self Care | Payer: MEDICAID | Source: Ambulatory Visit | Attending: Obstetrics and Gynecology | Admitting: Obstetrics and Gynecology

## 2023-01-01 ENCOUNTER — Encounter: Payer: Self-pay | Admitting: Urgent Care

## 2023-01-01 DIAGNOSIS — N921 Excessive and frequent menstruation with irregular cycle: Secondary | ICD-10-CM | POA: Insufficient documentation

## 2023-01-01 DIAGNOSIS — Z01812 Encounter for preprocedural laboratory examination: Secondary | ICD-10-CM | POA: Insufficient documentation

## 2023-01-01 LAB — TYPE AND SCREEN
ABO/RH(D): O POS
Antibody Screen: NEGATIVE

## 2023-01-01 LAB — CBC
HCT: 41.6 % (ref 36.0–46.0)
Hemoglobin: 13.4 g/dL (ref 12.0–15.0)
MCH: 30.8 pg (ref 26.0–34.0)
MCHC: 32.2 g/dL (ref 30.0–36.0)
MCV: 95.6 fL (ref 80.0–100.0)
Platelets: 215 10*3/uL (ref 150–400)
RBC: 4.35 MIL/uL (ref 3.87–5.11)
RDW: 12.4 % (ref 11.5–15.5)
WBC: 3.3 10*3/uL — ABNORMAL LOW (ref 4.0–10.5)
nRBC: 0 % (ref 0.0–0.2)

## 2023-01-01 NOTE — Plan of Care (Signed)
CHL Tonsillectomy/Adenoidectomy, Postoperative PEDS care plan entered in error.

## 2023-01-04 ENCOUNTER — Other Ambulatory Visit: Payer: Self-pay

## 2023-01-04 ENCOUNTER — Emergency Department
Admission: EM | Admit: 2023-01-04 | Discharge: 2023-01-05 | Disposition: A | Discharge status: Other Acute Inpatient Hospital | Payer: MEDICAID | Attending: Student in an Organized Health Care Education/Training Program | Admitting: Student in an Organized Health Care Education/Training Program

## 2023-01-04 DIAGNOSIS — T40721A Poisoning by synthetic cannabinoids, accidental (unintentional), initial encounter: Secondary | ICD-10-CM | POA: Insufficient documentation

## 2023-01-04 DIAGNOSIS — F332 Major depressive disorder, recurrent severe without psychotic features: Secondary | ICD-10-CM | POA: Diagnosis not present

## 2023-01-04 DIAGNOSIS — F17200 Nicotine dependence, unspecified, uncomplicated: Secondary | ICD-10-CM | POA: Diagnosis not present

## 2023-01-04 DIAGNOSIS — Y900 Blood alcohol level of less than 20 mg/100 ml: Secondary | ICD-10-CM | POA: Insufficient documentation

## 2023-01-04 DIAGNOSIS — J45909 Unspecified asthma, uncomplicated: Secondary | ICD-10-CM | POA: Diagnosis not present

## 2023-01-04 DIAGNOSIS — F209 Schizophrenia, unspecified: Secondary | ICD-10-CM | POA: Diagnosis not present

## 2023-01-04 DIAGNOSIS — T50904A Poisoning by unspecified drugs, medicaments and biological substances, undetermined, initial encounter: Secondary | ICD-10-CM

## 2023-01-04 LAB — URINE DRUG SCREEN, QUALITATIVE (ARMC ONLY)
Amphetamines, Ur Screen: NOT DETECTED
Barbiturates, Ur Screen: NOT DETECTED
Benzodiazepine, Ur Scrn: NOT DETECTED
Cannabinoid 50 Ng, Ur ~~LOC~~: POSITIVE — AB
Cocaine Metabolite,Ur ~~LOC~~: NOT DETECTED
MDMA (Ecstasy)Ur Screen: NOT DETECTED
Methadone Scn, Ur: NOT DETECTED
Opiate, Ur Screen: NOT DETECTED
Phencyclidine (PCP) Ur S: NOT DETECTED
Tricyclic, Ur Screen: NOT DETECTED

## 2023-01-04 LAB — COMPREHENSIVE METABOLIC PANEL
ALT: 16 U/L (ref 0–44)
AST: 21 U/L (ref 15–41)
Albumin: 4.1 g/dL (ref 3.5–5.0)
Alkaline Phosphatase: 73 U/L (ref 38–126)
Anion gap: 8 (ref 5–15)
BUN: 8 mg/dL (ref 6–20)
CO2: 25 mmol/L (ref 22–32)
Calcium: 9.8 mg/dL (ref 8.9–10.3)
Chloride: 102 mmol/L (ref 98–111)
Creatinine, Ser: 0.73 mg/dL (ref 0.44–1.00)
GFR, Estimated: >60 mL/min (ref >60)
Glucose, Bld: 92 mg/dL (ref 70–99)
Potassium: 3.9 mmol/L (ref 3.5–5.1)
Sodium: 135 mmol/L (ref 135–145)
Total Bilirubin: 0.9 mg/dL (ref 0.3–1.2)
Total Protein: 7.4 g/dL (ref 6.5–8.1)

## 2023-01-04 LAB — ACETAMINOPHEN LEVEL
Acetaminophen (Tylenol), Serum: <10 ug/mL — ABNORMAL LOW (ref 10–30)
Acetaminophen (Tylenol), Serum: <10 ug/mL — ABNORMAL LOW (ref 10–30)

## 2023-01-04 LAB — CBC
HCT: 45.2 % (ref 36.0–46.0)
Hemoglobin: 14.7 g/dL (ref 12.0–15.0)
MCH: 30.7 pg (ref 26.0–34.0)
MCHC: 32.5 g/dL (ref 30.0–36.0)
MCV: 94.4 fL (ref 80.0–100.0)
Platelets: 234 10*3/uL (ref 150–400)
RBC: 4.79 MIL/uL (ref 3.87–5.11)
RDW: 12.1 % (ref 11.5–15.5)
WBC: 4.1 10*3/uL (ref 4.0–10.5)
nRBC: 0 % (ref 0.0–0.2)

## 2023-01-04 LAB — POC URINE PREG, ED: Preg Test, Ur: NEGATIVE

## 2023-01-04 LAB — ETHANOL: Alcohol, Ethyl (B): <10 mg/dL (ref <10)

## 2023-01-04 LAB — SALICYLATE LEVEL: Salicylate Lvl: <7 mg/dL — ABNORMAL LOW (ref 7.0–30.0)

## 2023-01-04 MED ORDER — ONDANSETRON HCL 4 MG PO TABS: 4.0000 mg | ORAL_TABLET | Freq: Three times a day (TID) | ORAL | Status: DC | PRN

## 2023-01-04 MED ORDER — ALUM & MAG HYDROXIDE-SIMETH 200-200-20 MG/5ML PO SUSP
30.0000 mL | Freq: Four times a day (QID) | ORAL | Status: DC | PRN
Start: 2023-01-04 — End: 2023-01-05

## 2023-01-04 MED ORDER — IBUPROFEN 600 MG PO TABS: 600.0000 mg | ORAL_TABLET | Freq: Three times a day (TID) | ORAL | Status: DC | PRN

## 2023-01-04 NOTE — ED Notes (Signed)
.  EMTALA: REQUIRED DOCUMENTATION COMPLETED AND REVIEWED BY WRITER PRIOR TO PT TRANSFER MD REASSESSMENT EMTALA RN SECTION TRANSFER CONSENT SIGNED VS WITHIN REQUIRED TIME 1 PATIENT BELONGING BAG SENT WITH SAFE TRANSPORT

## 2023-01-04 NOTE — BH Assessment (Signed)
Patient is to be admitted to [facility] on 01/04/23 pending medical  clearance, arrive tonight after 2300.  Patient assigned to room 304 Accepting physician is Dr. Marlou Porch.  Representative was [employer].   ER Staff is aware of it:  [facility] ER Secretary  Dr. Roxan Hockey, ER MD  Amy Patient's Nurse

## 2023-01-04 NOTE — ED Notes (Signed)
Report called to [facility].
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
## 2023-01-04 NOTE — ED Notes (Signed)
Pt eating lunch meal.

## 2023-01-04 NOTE — ED Notes (Signed)
Pt signed voluntary admission form

## 2023-01-04 NOTE — ED Provider Notes (Signed)
-----------------------------------------   11:02 PM on 01/04/2023 -----------------------------------------  Patient accepted to [facility] behavioral unit.  We will arrange transport shortly.  EKG viewed and interpreted by myself shows a sinus arrhythmia at 74 bpm with a narrow QRS, normal axis, normal intervals, nonspecific but no concerning ST changes.   Minna Antis, MD 01/04/23 2303

## 2023-01-04 NOTE — ED Notes (Signed)
Pt dressed out into hospital provided scrubs  Pt belongings include. Yellow cross necklace  Black Crocs  Black Tank top  Black Hair tie Lip Blam  Black Gloves  Vape Yellow and Black Pants  Tan Bra  White Socks  [employer]

## 2023-01-04 NOTE — ED Provider Notes (Signed)
[facility] Provider Note    Event Date/Time   First MD Initiated Contact with Patient 01/04/23 1103     (approximate)   History   Drug Overdose   HPI  Kristy Reid is a 27 y.o. female with a history of depression as well as previous suicide attempts and ideation presents to the ER after intentionally ingesting 6 Phenergan and attempt to "not feel anything."  She is very adamant that she was not trying to harm herself but that she is dealing with quite a bit of stressors at home.     Physical Exam   Triage Vital Signs: ED Triage Vitals  Encounter Vitals Group     BP 01/04/23 1037 132/83     Systolic BP Percentile --      Diastolic BP Percentile --      Pulse Rate 01/04/23 1037 90     Resp 01/04/23 1037 16     Temp 01/04/23 1037 98.2 F (36.8 C)     Temp Source 01/04/23 1037 Oral     SpO2 01/04/23 1037 97 %     Weight --      Height --      Head Circumference --      Peak Flow --      Pain Score 01/04/23 1048 4     Pain Loc --      Pain Education --      Exclude from Growth Chart --     Most recent vital signs: Vitals:   01/04/23 1230 01/04/23 1300  BP:  106/67  Pulse: 70 60  Resp: (!) 21 14  Temp:    SpO2: 100% 100%     Constitutional: Alert  Eyes: Conjunctivae are normal.  Head: Atraumatic. Nose: No congestion/rhinnorhea. Mouth/Throat: Mucous membranes are moist.   Neck: Painless ROM.  Cardiovascular:   Good peripheral circulation. Respiratory: Normal respiratory effort.  No retractions.  Gastrointestinal: Soft and nontender.  Musculoskeletal:  no deformity Neurologic:  MAE spontaneously. No gross focal neurologic deficits are appreciated.  Skin:  Skin is warm, dry and intact. No rash noted. Psychiatric: Mood and affect are normal. Speech and behavior are normal.    ED Results / Procedures / Treatments   Labs (all labs ordered are listed, but only abnormal results are displayed) Labs Reviewed  SALICYLATE LEVEL  - Abnormal; Notable for the following components:      Result Value   Salicylate Lvl <7.0 (*)    All other components within normal limits  ACETAMINOPHEN LEVEL - Abnormal; Notable for the following components:   Acetaminophen (Tylenol), Serum <10 (*)    All other components within normal limits  URINE DRUG SCREEN, QUALITATIVE ([facility] ONLY) - Abnormal; Notable for the following components:   Cannabinoid 50 Ng, Ur [place] POSITIVE (*)    All other components within normal limits  COMPREHENSIVE METABOLIC PANEL  ETHANOL  CBC  ACETAMINOPHEN LEVEL  POC URINE PREG, ED     EKG ED ECG REPORT I, Willy Eddy, the attending physician, personally viewed and interpreted this ECG.   Date: 01/04/2023  EKG Time: 10:47  Rate: 80  Rhythm: sinus  Axis: normal  Intervals: normal  ST&T Change: no stemi, no depressions     RADIOLOGY    PROCEDURES:  Critical Care performed: no  Procedures   MEDICATIONS ORDERED IN ED: Medications - No data to display   IMPRESSION / MDM / ASSESSMENT AND PLAN / ED COURSE  I reviewed the triage vital  signs and the nursing notes.                              Differential diagnosis includes, but is not limited to,Psychosis, delirium, medication effect, noncompliance, polysubstance abuse, Si, Hi, depression  Patient presenting to the ER for evaluation of symptoms as described above.  Based on symptoms, risk factors and considered above differential, this presenting complaint could reflect a potentially life-threatening illness therefore the patient will be placed on continuous pulse oximetry and telemetry for monitoring.  Laboratory evaluation will be sent to evaluate for the above complaints.  Will observe in the ER post ingestion will consult psych.        FINAL CLINICAL IMPRESSION(S) / ED DIAGNOSES   Final diagnoses:  Overdose of undetermined intent, initial encounter     Rx / DC Orders   ED Discharge Orders     None        Note:   This document was prepared using Dragon voice recognition software and may include unintentional dictation errors.    Willy Eddy, MD 01/04/23 [phone]

## 2023-01-04 NOTE — ED Provider Notes (Signed)
Procedures     ----------------------------------------- 5:15 PM on 01/04/2023 ----------------------------------------- Remains asymptomatic, not overly sedated, no signs of seizure, vital signs stable.  Medically stable to proceed with psychiatry evaluation and disposition.     Sharman Cheek, MD 01/04/23 [phone]

## 2023-01-05 ENCOUNTER — Other Ambulatory Visit: Payer: Self-pay

## 2023-01-05 ENCOUNTER — Encounter (HOSPITAL_COMMUNITY): Payer: Self-pay | Admitting: Psychiatry

## 2023-01-05 ENCOUNTER — Inpatient Hospital Stay (HOSPITAL_COMMUNITY)
Admission: AD | Admit: 2023-01-05 | Discharge: 2023-01-11 | DRG: 885 | Disposition: A | Discharge status: Home / Self Care | Payer: MEDICAID | Source: Intra-hospital | Attending: Psychiatry | Admitting: Psychiatry

## 2023-01-05 DIAGNOSIS — D509 Iron deficiency anemia, unspecified: Secondary | ICD-10-CM | POA: Diagnosis present

## 2023-01-05 DIAGNOSIS — Z7951 Long term (current) use of inhaled steroids: Secondary | ICD-10-CM

## 2023-01-05 DIAGNOSIS — Z9151 Personal history of suicidal behavior: Secondary | ICD-10-CM

## 2023-01-05 DIAGNOSIS — Z8249 Family history of ischemic heart disease and other diseases of the circulatory system: Secondary | ICD-10-CM

## 2023-01-05 DIAGNOSIS — K5900 Constipation, unspecified: Secondary | ICD-10-CM | POA: Diagnosis present

## 2023-01-05 DIAGNOSIS — J45909 Unspecified asthma, uncomplicated: Secondary | ICD-10-CM | POA: Diagnosis present

## 2023-01-05 DIAGNOSIS — Z23 Encounter for immunization: Secondary | ICD-10-CM | POA: Diagnosis not present

## 2023-01-05 DIAGNOSIS — F333 Major depressive disorder, recurrent, severe with psychotic symptoms: Principal | ICD-10-CM | POA: Diagnosis present

## 2023-01-05 DIAGNOSIS — F17200 Nicotine dependence, unspecified, uncomplicated: Secondary | ICD-10-CM | POA: Diagnosis present

## 2023-01-05 DIAGNOSIS — N390 Urinary tract infection, site not specified: Secondary | ICD-10-CM | POA: Diagnosis present

## 2023-01-05 DIAGNOSIS — G4700 Insomnia, unspecified: Secondary | ICD-10-CM | POA: Diagnosis present

## 2023-01-05 DIAGNOSIS — F1220 Cannabis dependence, uncomplicated: Secondary | ICD-10-CM | POA: Diagnosis present

## 2023-01-05 DIAGNOSIS — Z9152 Personal history of nonsuicidal self-harm: Secondary | ICD-10-CM | POA: Diagnosis not present

## 2023-01-05 DIAGNOSIS — I10 Essential (primary) hypertension: Secondary | ICD-10-CM | POA: Diagnosis present

## 2023-01-05 DIAGNOSIS — Z833 Family history of diabetes mellitus: Secondary | ICD-10-CM

## 2023-01-05 DIAGNOSIS — Z79899 Other long term (current) drug therapy: Secondary | ICD-10-CM | POA: Diagnosis not present

## 2023-01-05 DIAGNOSIS — Z818 Family history of other mental and behavioral disorders: Secondary | ICD-10-CM

## 2023-01-05 DIAGNOSIS — F4310 Post-traumatic stress disorder, unspecified: Secondary | ICD-10-CM | POA: Diagnosis present

## 2023-01-05 DIAGNOSIS — F332 Major depressive disorder, recurrent severe without psychotic features: Secondary | ICD-10-CM | POA: Insufficient documentation

## 2023-01-05 DIAGNOSIS — Z62810 Personal history of physical and sexual abuse in childhood: Secondary | ICD-10-CM | POA: Diagnosis not present

## 2023-01-05 DIAGNOSIS — F411 Generalized anxiety disorder: Secondary | ICD-10-CM | POA: Diagnosis present

## 2023-01-05 DIAGNOSIS — F609 Personality disorder, unspecified: Secondary | ICD-10-CM | POA: Diagnosis present

## 2023-01-05 DIAGNOSIS — Z86718 Personal history of other venous thrombosis and embolism: Secondary | ICD-10-CM

## 2023-01-05 DIAGNOSIS — F17210 Nicotine dependence, cigarettes, uncomplicated: Secondary | ICD-10-CM | POA: Diagnosis present

## 2023-01-05 DIAGNOSIS — Z90710 Acquired absence of both cervix and uterus: Secondary | ICD-10-CM

## 2023-01-05 MED ORDER — ENSURE ENLIVE PO LIQD
237.0000 mL | Freq: Three times a day (TID) | ORAL | Status: DC
  Administered 2023-01-05 – 2023-01-11 (×19): 237 mL via ORAL
  Filled 2023-01-05 (×25): qty 237

## 2023-01-05 MED ORDER — LORAZEPAM 2 MG/ML IJ SOLN: 2.0000 mg | Freq: Three times a day (TID) | INTRAMUSCULAR | Status: DC | PRN

## 2023-01-05 MED ORDER — WHITE PETROLATUM EX OINT
TOPICAL_OINTMENT | CUTANEOUS | Status: AC
  Filled 2023-01-05: qty 5

## 2023-01-05 MED ORDER — ALBUTEROL SULFATE HFA 108 (90 BASE) MCG/ACT IN AERS
2.0000 | INHALATION_SPRAY | Freq: Four times a day (QID) | RESPIRATORY_TRACT | Status: DC | PRN
  Administered 2023-01-07: 2 via RESPIRATORY_TRACT
  Filled 2023-01-05: qty 6.7

## 2023-01-05 MED ORDER — LIDOCAINE 5 % EX PTCH
1.0000 | MEDICATED_PATCH | CUTANEOUS | Status: DC
  Administered 2023-01-05 – 2023-01-07 (×3): 1 via TRANSDERMAL
  Filled 2023-01-05 (×5): qty 1

## 2023-01-05 MED ORDER — ESCITALOPRAM OXALATE 10 MG PO TABS
10.0000 mg | ORAL_TABLET | Freq: Every day | ORAL | Status: DC
  Administered 2023-01-05 – 2023-01-07 (×3): 10 mg via ORAL
  Filled 2023-01-05 (×5): qty 1

## 2023-01-05 MED ORDER — DOCUSATE SODIUM 100 MG PO CAPS
100.0000 mg | ORAL_CAPSULE | Freq: Two times a day (BID) | ORAL | Status: DC
  Administered 2023-01-05 – 2023-01-11 (×12): 100 mg via ORAL
  Filled 2023-01-05 (×16): qty 1

## 2023-01-05 MED ORDER — DOCUSATE SODIUM 100 MG PO CAPS: 100.0000 mg | ORAL_CAPSULE | Freq: Every day | ORAL | Status: DC

## 2023-01-05 MED ORDER — MAGNESIUM HYDROXIDE 400 MG/5ML PO SUSP: 15.0000 mL | Freq: Every day | ORAL | Status: DC | PRN

## 2023-01-05 MED ORDER — CHLORHEXIDINE GLUCONATE 4 % EX SOLN
Freq: Every day | CUTANEOUS | Status: DC
  Filled 2023-01-05: qty 15

## 2023-01-05 MED ORDER — FLUTICASONE PROPIONATE 50 MCG/ACT NA SUSP
1.0000 | Freq: Every day | NASAL | Status: DC
  Administered 2023-01-05 – 2023-01-11 (×7): 1 via NASAL
  Filled 2023-01-05 (×2): qty 16

## 2023-01-05 MED ORDER — ENSURE ENLIVE PO LIQD
237.0000 mL | Freq: Two times a day (BID) | ORAL | Status: DC
  Administered 2023-01-05: 237 mL via ORAL
  Filled 2023-01-05 (×3): qty 237

## 2023-01-05 MED ORDER — QUETIAPINE FUMARATE 50 MG PO TABS
50.0000 mg | ORAL_TABLET | Freq: Every day | ORAL | Status: DC
  Administered 2023-01-05: 50 mg via ORAL
  Filled 2023-01-05 (×2): qty 1

## 2023-01-05 MED ORDER — FLUTICASONE PROPIONATE HFA 110 MCG/ACT IN AERO
2.0000 | INHALATION_SPRAY | Freq: Two times a day (BID) | RESPIRATORY_TRACT | Status: DC
Start: 2023-01-05 — End: 2023-01-11
  Administered 2023-01-05 – 2023-01-11 (×13): 2 via RESPIRATORY_TRACT
  Filled 2023-01-05 (×2): qty 12

## 2023-01-05 MED ORDER — DIPHENHYDRAMINE HCL 25 MG PO CAPS
25.0000 mg | ORAL_CAPSULE | Freq: Every day | ORAL | Status: DC | PRN
  Administered 2023-01-05 – 2023-01-07 (×3): 25 mg via ORAL
  Filled 2023-01-05 (×3): qty 1

## 2023-01-05 MED ORDER — HYDROXYZINE HCL 25 MG PO TABS
25.0000 mg | ORAL_TABLET | Freq: Three times a day (TID) | ORAL | Status: DC | PRN
  Administered 2023-01-05 – 2023-01-11 (×10): 25 mg via ORAL
  Filled 2023-01-05: qty 10
  Filled 2023-01-05 (×10): qty 1

## 2023-01-05 MED ORDER — ACETAMINOPHEN 325 MG PO TABS
650.0000 mg | ORAL_TABLET | Freq: Four times a day (QID) | ORAL | Status: DC | PRN
  Administered 2023-01-05 – 2023-01-09 (×4): 650 mg via ORAL
  Filled 2023-01-05 (×4): qty 2

## 2023-01-05 MED ORDER — LORAZEPAM 1 MG PO TABS: 2.0000 mg | ORAL_TABLET | Freq: Three times a day (TID) | ORAL | Status: DC | PRN

## 2023-01-05 MED ORDER — NICOTINE 14 MG/24HR TD PT24
14.0000 mg | MEDICATED_PATCH | Freq: Every day | TRANSDERMAL | Status: DC
  Administered 2023-01-05 – 2023-01-07 (×3): 14 mg via TRANSDERMAL
  Filled 2023-01-05 (×5): qty 1

## 2023-01-05 MED ORDER — TRAZODONE HCL 50 MG PO TABS
50.0000 mg | ORAL_TABLET | Freq: Every evening | ORAL | Status: DC | PRN
  Administered 2023-01-05 – 2023-01-06 (×2): 50 mg via ORAL
  Filled 2023-01-05 (×2): qty 1

## 2023-01-05 MED ORDER — TRAZODONE HCL 50 MG PO TABS
50.0000 mg | ORAL_TABLET | ORAL | Status: AC
  Administered 2023-01-05: 50 mg via ORAL
  Filled 2023-01-05 (×2): qty 1

## 2023-01-05 MED ORDER — FERROUS SULFATE 325 (65 FE) MG PO TABS
325.0000 mg | ORAL_TABLET | Freq: Three times a day (TID) | ORAL | Status: DC
  Administered 2023-01-05 – 2023-01-10 (×16): 325 mg via ORAL
  Filled 2023-01-05 (×24): qty 1

## 2023-01-05 MED ORDER — INFLUENZA VIRUS VACC SPLIT PF (FLUZONE) 0.5 ML IM SUSY
0.5000 mL | PREFILLED_SYRINGE | INTRAMUSCULAR | Status: AC
  Administered 2023-01-06: 0.5 mL via INTRAMUSCULAR
  Filled 2023-01-05: qty 0.5

## 2023-01-05 MED ORDER — METOPROLOL TARTRATE 25 MG PO TABS
25.0000 mg | ORAL_TABLET | Freq: Two times a day (BID) | ORAL | Status: DC
  Administered 2023-01-05 – 2023-01-11 (×13): 25 mg via ORAL
  Filled 2023-01-05 (×17): qty 1

## 2023-01-05 NOTE — Plan of Care (Signed)
Problem: Education: Goal: Knowledge of [place] [facility] Education information/materials will improve Outcome: Progressing   Problem: Physical Regulation: Goal: Ability to maintain clinical measurements within normal limits will improve Outcome: Progressing   Problem: Safety: Goal: Periods of time without injury will increase Outcome: Progressing   Problem: Activity: Goal: Interest or engagement in activities will improve Outcome: Progressing  Pt presents with flat affect, fair eye contact, logical speech and ambulatory with steady gait. Denies SI, HI and pain. Endorsed +AVH this morning. Per pt "I hear voices telling me that someone is out to get me. I do see someone in my room". Visible in dayroom majority of this shift, noted to be animated, engaging, hyper-verbal on interactions. Rated her depression 5/10, hopelessness 0/10 and anxiety 8/10 "I will use my coping skills for now". Received PRN Benadryl 25 mg PO at 1157 for complain of itching with relief when reassessed at 1250. Pt has been compliant with current treatment regimen including scheduled groups. Tolerated medications, meals and fluids well without discomfort. Safety checks maintained at Q 15 minutes intervals  without outburst. Support, encouragement and reassurance offered.

## 2023-01-05 NOTE — H&P (Signed)
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 improve, Ability to maintain clinical measurements within normal limits will improve, Compliance with prescribed medications will improve, and Ability to identify triggers associated with substance abuse/mental health issues will improve - Long Term Goals: Improvement in symptoms so as ready for discharge - Patient is encouraged to participate in group therapy while admitted to the psychiatric unit. - We will address other chronic and acute stressors, which contributed to the patient's MDD (major depressive disorder), recurrent severe, without psychosis (HCC) in order to reduce the risk of self-harm at discharge.  5. Discharge Planning:  - Social work and case management to assist with  discharge planning and identification of hospital follow-up needs prior to discharge - Estimated LOS: 5-7 days - Discharge Concerns: Need to establish a safety plan; Medication compliance and effectiveness - Discharge Goals: Return home with outpatient referrals for mental health follow-up including medication management/psychotherapy  I certify that inpatient services furnished can reasonably be expected to improve the patient's condition.    I discussed my assessment, planned testing and intervention for the patient with Dr. Enedina Finner who agrees with my formulated course of action.   Bo Mcclintock, Medical Student 10/22/20242:16 PM  I personally was present and performed or re-performed the history and medical decision-making activities of this service and have verified that the service and findings are accurately documented in the student's note.  Kizzie Ide, MD, PGY-2

## 2023-01-05 NOTE — Progress Notes (Signed)
   01/05/23 0600  15 Minute Checks  Location Bedroom  Visual Appearance Calm  Behavior Composed  Sleep (Behavioral Health Patients Only)  Calculate sleep? (Click Yes once per 24 hr at 0600 safety check) Yes  Documented sleep last 24 hours 2.5

## 2023-01-05 NOTE — Hospital Course (Signed)
HPI  CC       SI

## 2023-01-05 NOTE — Plan of Care (Signed)
  Problem: Education: Goal: Emotional status will improve Outcome: Progressing Goal: Mental status will improve Outcome: Progressing   Problem: Activity: Goal: Interest or engagement in activities will improve Outcome: Progressing   Problem: Coping: Goal: Ability to demonstrate self-control will improve Outcome: Progressing   

## 2023-01-05 NOTE — Progress Notes (Signed)
Pt stated she takes 150 mg Trazodone and 300 mg Seroquel at home to help her sleep

## 2023-01-05 NOTE — Tx Team (Signed)
Initial Treatment Plan 01/05/2023 3:22 AM [name] Donnald Garre BMW:[id number]    PATIENT STRESSORS: Marital or family conflict   Medication change or noncompliance     PATIENT STRENGTHS: General fund of knowledge  Motivation for treatment/growth    PATIENT IDENTIFIED PROBLEMS: Risk for SI  depression  anxiety  "Nothing"               DISCHARGE CRITERIA:  Improved stabilization in mood, thinking, and/or behavior Verbal commitment to aftercare and medication compliance  PRELIMINARY DISCHARGE PLAN: Attend aftercare/continuing care group Outpatient therapy  PATIENT/FAMILY INVOLVEMENT: This treatment plan has been presented to and reviewed with the patient, Kristy Reid.  The patient and family have been given the opportunity to ask questions and make suggestions.  Delos Haring, RN 01/05/2023, 3:22 AM
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
## 2023-01-05 NOTE — BHH Group Notes (Signed)
Adult Psychoeducational Group Note  Date:  01/05/2023 Time:  9:15 PM  Group Topic/Focus:  Wrap-Up Group:   The focus of this group is to help patients review their daily goal of treatment and discuss progress on daily workbooks.  Participation Level:  Active  Participation Quality:  Appropriate  Affect:  Appropriate  Cognitive:  Appropriate  Insight: Appropriate  Engagement in Group:  Engaged  Modes of Intervention:  Discussion  Additional Comments:   Pt states that she had a good day, has been participating in programming. Pt enjoyed recreation time where she was able to exercise. Pt endorsed anxiety at an 8, depression at a 4, and AVH constantly whenever its quiet  Mareesa Gathright A Lorenda Hatchet 01/05/2023, 9:15 PM
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
## 2023-01-05 NOTE — Progress Notes (Signed)
Recreation Therapy Notes  INPATIENT RECREATION THERAPY ASSESSMENT  Patient Details Name: Kristy Reid MRN: [id number] DOB: 07-13-1995 Today's Date: 01/05/2023       Information Obtained From: Patient  Able to Participate in Assessment/Interview: Yes  Patient Presentation: Alert  Reason for Admission (Per Patient): Other (Comments) (Pt stated she overdosed trying to numb herself. Pt stated she wasn't trying to kill herself.)  Patient Stressors: Other (Comment) ("life is lifeing")  Coping Skills:   Isolation, Journal, Sports, Exercise, Music, Substance Abuse, Prayer, Avoidance, Read, Hot Bath/Shower  Leisure Interests (2+):  Exercise - Walking, Sports - Other (Comment), Community - Other (Comment) (Racquetball, Driving)  Frequency of Recreation/Participation: Other (Comment) (Daily)  Awareness of Community Resources:  Yes  Community Resources:  Gym, Other (Comment) (Peer Support; Therapist/Psychologist offices)  Current Use: Yes  If no, Barriers?:    Expressed Interest in [employer] Information: No  [place]  Patient Main Form of Transportation: Walk  Patient Strengths:  Doctor, hospital for self/others; Singing  Patient Identified Areas of Improvement:  Communication- (not comfortable starting a conversation)  Patient Goal for Hospitalization:  "to try to get stable on medication"  Current SI (including self-harm):  No  Current HI:  No  Current AVH: Yes (Hears- "not good things"; Sees shadows and sometimes people)  Staff Intervention Plan: Group Attendance, Collaborate with Interdisciplinary Treatment Team  Consent to Intern Participation: N/A   Maurizio Geno-McCall, LRT,CTRS Amor Packard A Jakie Debow-McCall 01/05/2023, 2:04 PM

## 2023-01-05 NOTE — Group Note (Signed)
Recreation Therapy Group Note   Group Topic:Health and Wellness  Group Date: 01/05/2023 Start Time: 1100 End Time: 1125 Facilitators: Takiya Belmares-McCall, LRT,CTRS Location: [address] Dayroom   Group Topic: Wellness  Goal Area(s) Addresses:  Patient will define components of whole wellness. Patient will verbalize benefit of whole wellness.  Group Description. Exercise. LRT and patients talked about the importance of physical wellness in self care. Patients took turns leading the group in the exercises and stretches of their choosing. LRT explained to peers they could take breaks or get water as needed.   Education: Wellness, Building control surveyor.   Education Outcome: Acknowledges education/In group clarification offered/Needs additional education.    Affect/Mood: Appropriate   Participation Level: Engaged   Participation Quality: Independent   Behavior: Appropriate   Speech/Thought Process: Focused   Insight: Good   Judgement: Good   Modes of Intervention: Music   Patient Response to Interventions:  Engaged   Education Outcome:  In group clarification offered    Clinical Observations/Individualized Feedback: Pt was bright and comedic in activity. Pt had moments were she would talk about having arthritis, call herself "big back" and not being able to complete some of the exercises but would complete the exercises with encouragement. Pt was attentive and receptive during group.      Plan: Continue to engage patient in RT group sessions 2-3x/week.   Kristy Reid, LRT,CTRS  01/05/2023 1:29 PM
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
## 2023-01-05 NOTE — Progress Notes (Signed)
   01/05/23 2030  Psych Admission Type (Psych Patients Only)  Admission Status Voluntary  Psychosocial Assessment  Patient Complaints Anxiety;Depression  Eye Contact Fair  Facial Expression Sad  Affect Anxious  Speech Logical/coherent  Interaction Assertive  Motor Activity Slow  Appearance/Hygiene Disheveled  Behavior Characteristics Cooperative  Mood Anxious;Pleasant  Aggressive Behavior  Effect No apparent injury  Thought Process  Coherency WDL  Content WDL  Delusions WDL  Perception Hallucinations  Hallucination Auditory;Visual  Judgment Impaired  Confusion WDL  Danger to Self  Current suicidal ideation? Denies

## 2023-01-05 NOTE — BHH Group Notes (Signed)
Adult Psychoeducational Group Note  Date:  01/05/2023 Time:  10:04 AM  Group Topic/Focus:  Goals Group:   The focus of this group is to help patients establish daily goals to achieve during treatment and discuss how the patient can incorporate goal setting into their daily lives to aide in recovery.  Participation Level:  Active  Participation Quality:  Appropriate  Affect:  Appropriate  Cognitive:  Appropriate  Insight: Appropriate  Engagement in Group:  Engaged  Modes of Intervention:  Discussion  Additional Comments:  Pt attended the goals group and remained appropriate and engaged throughout the duration of the group.   Fara Olden O 01/05/2023, 10:04 AM

## 2023-01-06 ENCOUNTER — Encounter (HOSPITAL_COMMUNITY): Payer: Self-pay

## 2023-01-06 LAB — LIPID PANEL
Cholesterol: 151 mg/dL (ref 0–200)
HDL: 51 mg/dL (ref >40)
LDL Cholesterol: 90 mg/dL (ref 0–99)
Total CHOL/HDL Ratio: 3 {ratio}
Triglycerides: 52 mg/dL (ref <150)
VLDL: 10 mg/dL (ref 0–40)

## 2023-01-06 LAB — TSH: TSH: 1.718 u[IU]/mL (ref 0.350–4.500)

## 2023-01-06 LAB — HEMOGLOBIN A1C
Hgb A1c MFr Bld: 4.9 % (ref 4.8–5.6)
Mean Plasma Glucose: 93.93 mg/dL

## 2023-01-06 MED ORDER — QUETIAPINE FUMARATE 100 MG PO TABS
100.0000 mg | ORAL_TABLET | Freq: Every day | ORAL | Status: DC
  Administered 2023-01-06: 100 mg via ORAL
  Filled 2023-01-06 (×2): qty 1

## 2023-01-06 NOTE — Plan of Care (Signed)
Problem: Education: Goal: Knowledge of [place] Education information/materials will improve Outcome: Progressing   Problem: Activity: Goal: Interest or engagement in activities will improve Outcome: Progressing Goal: Sleeping patterns will improve Outcome: Progressing   Problem: Coping: Goal: Ability to verbalize frustrations and anger appropriately will improve Outcome: Progressing

## 2023-01-06 NOTE — [facility] Group Notes (Signed)
The focus of this group is to help patients establish daily goals to achieve during treatment and discuss how the patient can incorporate goal setting into their daily lives to aide in recovery.  Pt attended group

## 2023-01-06 NOTE — BH IP Treatment Plan (Signed)
Interdisciplinary Treatment and Diagnostic Plan Initial  01/06/2023 Time of Session: 1000 Lucely Arber MRN: [id number]  Principal Diagnosis: <principal problem not specified>  Secondary Diagnoses: Active Problems:   Cannabis use disorder, severe, dependence (HCC)   Tobacco use disorder   MDD (major depressive disorder), recurrent, severe, with psychosis (HCC)   GAD (generalized anxiety disorder)   Current Medications:  Current Facility-Administered Medications  Medication Dose Route Frequency Provider Last Rate Last Admin   acetaminophen (TYLENOL) tablet 650 mg  650 mg Oral Q6H PRN Onuoha, Chinwendu V, NP   650 mg at 01/06/23 0831   albuterol (VENTOLIN HFA) 108 (90 Base) MCG/ACT inhaler 2 puff  2 puff Inhalation Q6H PRN Kizzie Ide B, MD       lidocaine (LIDODERM) 5 % 1 patch  1 patch Transdermal Q24H Kizzie Ide B, MD   1 patch at 01/05/23 1150   And   diphenhydrAMINE (BENADRYL) capsule 25 mg  25 mg Oral Daily PRN Kizzie Ide B, MD   25 mg at 01/05/23 1157   docusate sodium (COLACE) capsule 100 mg  100 mg Oral BID Kizzie Ide B, MD   100 mg at 01/06/23 0831   escitalopram (LEXAPRO) tablet 10 mg  10 mg Oral Daily Kizzie Ide B, MD   10 mg at 01/06/23 1027   feeding supplement (ENSURE ENLIVE / ENSURE PLUS) liquid 237 mL  237 mL Oral TID BM Kizzie Ide B, MD   237 mL at 01/05/23 2052   ferrous sulfate tablet 325 mg  325 mg Oral TID Kizzie Ide B, MD   325 mg at 01/06/23 0831   fluticasone (FLONASE) 50 MCG/ACT nasal spray 1 spray  1 spray Each Nare Daily Kizzie Ide B, MD   1 spray at 01/06/23 0832   fluticasone (FLOVENT HFA) 110 MCG/ACT inhaler 2 puff  2 puff Inhalation BID Kizzie Ide B, MD   2 puff at 01/06/23 2536   hydrOXYzine (ATARAX) tablet 25 mg  25 mg Oral TID PRN Onuoha, Chinwendu V, NP   25 mg at 01/06/23 0834   LORazepam (ATIVAN) tablet 2 mg  2 mg Oral TID PRN Onuoha, Chinwendu V, NP       Or   LORazepam (ATIVAN) injection 2 mg  2 mg  Intramuscular TID PRN Onuoha, Chinwendu V, NP       magnesium hydroxide (MILK OF MAGNESIA) suspension 15 mL  15 mL Oral Daily PRN Kizzie Ide B, MD       metoprolol tartrate (LOPRESSOR) tablet 25 mg  25 mg Oral BID Kizzie Ide B, MD   25 mg at 01/06/23 0831   nicotine (NICODERM CQ - dosed in mg/24 hours) patch 14 mg  14 mg Transdermal Daily Sindy Guadeloupe, NP   14 mg at 01/06/23 6440   QUEtiapine (SEROQUEL) tablet 50 mg  50 mg Oral QHS Kizzie Ide B, MD   50 mg at 01/05/23 2044   traZODone (DESYREL) tablet 50 mg  50 mg Oral QHS PRN Kizzie Ide B, MD   50 mg at 01/05/23 2044   PTA Medications: Medications Prior to Admission  Medication Sig Dispense Refill Last Dose   medroxyPROGESTERone (DEPO-PROVERA) 150 MG/ML injection Inject 150 mg into the muscle every 3 (three) months.      albuterol (VENTOLIN HFA) 108 (90 Base) MCG/ACT inhaler Inhale 2 puffs into the lungs every 6 (six) hours as needed for wheezing or shortness of breath.      Ferrous Sulfate (IRON) 325 (65 Fe) MG TABS Take  1 tablet by mouth 3 (three) times daily.      fluticasone (FLONASE) 50 MCG/ACT nasal spray Place 1 spray into both nostrils daily.      fluticasone (FLOVENT HFA) 110 MCG/ACT inhaler Inhale 2 puffs into the lungs 2 (two) times daily.      meclizine (ANTIVERT) 25 MG tablet Take 25 mg by mouth 3 (three) times daily as needed for dizziness.      meloxicam (MOBIC) 7.5 MG tablet Take 7.5 mg by mouth daily.      metoprolol tartrate (LOPRESSOR) 25 MG tablet Take 25 mg by mouth 2 (two) times daily.      promethazine (PHENERGAN) 25 MG tablet Take 1 tablet (25 mg total) by mouth every 6 (six) hours as needed for nausea or vomiting. (Patient not taking: Reported on 12/31/2022) 12 tablet 0     Patient Stressors: Marital or family conflict   Medication change or noncompliance    Patient Strengths: Automotive engineer for treatment/growth   Treatment Modalities: Medication Management, Group therapy,  Case management,  1 to 1 session with clinician, Psychoeducation, Recreational therapy.   Physician Treatment Plan for Primary Diagnosis: <principal problem not specified> Long Term Goal(s):     Short Term Goals: Ability to identify changes in lifestyle to reduce recurrence of condition will improve Ability to verbalize feelings will improve Ability to disclose and discuss suicidal ideas Ability to demonstrate self-control will improve Ability to identify and develop effective coping behaviors will improve Ability to maintain clinical measurements within normal limits will improve Compliance with prescribed medications will improve Ability to identify triggers associated with substance abuse/mental health issues will improve  Medication Management: Evaluate patient's response, side effects, and tolerance of medication regimen.  Therapeutic Interventions: 1 to 1 sessions, Unit Group sessions and Medication administration.  Evaluation of Outcomes: Progressing  Physician Treatment Plan for Secondary Diagnosis: Active Problems:   Cannabis use disorder, severe, dependence (HCC)   Tobacco use disorder   MDD (major depressive disorder), recurrent, severe, with psychosis (HCC)   GAD (generalized anxiety disorder)  Long Term Goal(s):     Short Term Goals: Ability to identify changes in lifestyle to reduce recurrence of condition will improve Ability to verbalize feelings will improve Ability to disclose and discuss suicidal ideas Ability to demonstrate self-control will improve Ability to identify and develop effective coping behaviors will improve Ability to maintain clinical measurements within normal limits will improve Compliance with prescribed medications will improve Ability to identify triggers associated with substance abuse/mental health issues will improve     Medication Management: Evaluate patient's response, side effects, and tolerance of medication regimen.  Therapeutic  Interventions: 1 to 1 sessions, Unit Group sessions and Medication administration.  Evaluation of Outcomes: Progressing   RN Treatment Plan for Primary Diagnosis: <principal problem not specified> Long Term Goal(s): Knowledge of disease and therapeutic regimen to maintain health will improve  Short Term Goals: Ability to remain free from injury will improve, Ability to verbalize frustration and anger appropriately will improve, Ability to demonstrate self-control, Ability to participate in decision making will improve, Ability to verbalize feelings will improve, Ability to disclose and discuss suicidal ideas, Ability to identify and develop effective coping behaviors will improve, and Compliance with prescribed medications will improve  Medication Management: RN will administer medications as ordered by provider, will assess and evaluate patient's response and provide education to patient for prescribed medication. RN will report any adverse and/or side effects to prescribing provider.  Therapeutic Interventions: 1  on 1 counseling sessions, Psychoeducation, Medication administration, Evaluate responses to treatment, Monitor vital signs and CBGs as ordered, Perform/monitor CIWA, COWS, AIMS and Fall Risk screenings as ordered, Perform wound care treatments as ordered.  Evaluation of Outcomes: Progressing   LCSW Treatment Plan for Primary Diagnosis: <principal problem not specified> Long Term Goal(s): Safe transition to appropriate next level of care at discharge, Engage patient in therapeutic group addressing interpersonal concerns.  Short Term Goals: Engage patient in aftercare planning with referrals and resources, Increase social support, Increase ability to appropriately verbalize feelings, Increase emotional regulation, Facilitate acceptance of mental health diagnosis and concerns, Facilitate patient progression through stages of change regarding substance use diagnoses and concerns, Identify  triggers associated with mental health/substance abuse issues, and Increase skills for wellness and recovery  Therapeutic Interventions: Assess for all discharge needs, 1 to 1 time with Social worker, Explore available resources and support systems, Assess for adequacy in community support network, Educate family and significant other(s) on suicide prevention, Complete Psychosocial Assessment, Interpersonal group therapy.  Evaluation of Outcomes: Progressing   Progress in Treatment: Attending groups: Yes. Participating in groups: Yes. Taking medication as prescribed: Yes. Toleration medication: Yes. Family/Significant other contact made: No, will contact:  Consents pending Patient understands diagnosis: Yes. Discussing patient identified problems/goals with staff: Yes. Medical problems stabilized or resolved: Yes. Denies suicidal/homicidal ideation: Yes. Issues/concerns per patient self-inventory: Yes. Other: N/A  New problem(s) identified: No, Describe:  None Reported  New Short Term/Long Term Goal(s):medication stabilization, elimination of SI thoughts, development of comprehensive mental wellness plan.     Patient Goals:  Medication Stabilization  Discharge Plan or Barriers: SW will continue to follow and assess for appropriate referrals and possible discharge planning.     Reason for Continuation of Hospitalization: Anxiety Delusions  Depression Hallucinations Medication stabilization Suicidal ideation Withdrawal symptoms  Estimated Length of Stay: 5-7 Days  Last 3 [place] Suicide Severity Risk Score: Flowsheet Row [facility] (Current[facility]04/2023 in [facility] Department at [facility] Pre-Admission Testing 45 from 12/31/2022 in [facility] TEST[facility]  C-SSRS RISK CATEGORY No Risk No Risk No Risk       Last PHQ 2/9 Scores:     No data to  display          Scribe for Treatment Team: Ane Payment, LCSW 01/06/2023 10:02 AM  detox, medication management for mood stabilization; elimination of SI thoughts; development of comprehensive mental wellness/sobriety plan

## 2023-01-06 NOTE — Group Note (Signed)
Date:  01/06/2023 Time:  9:18 PM  Group Topic/Focus:  Wrap-Up Group:   The focus of this group is to help patients review their daily goal of treatment and discuss progress on daily workbooks.    Participation Level:  Active  Participation Quality:  Appropriate  Affect:  Appropriate  Cognitive:  Appropriate  Insight: Appropriate  Engagement in Group:  Engaged  Modes of Intervention:  Education and Exploration  Additional Comments:  Patient attended and participated in group tonight.  She reports that today she reached out to her wife, because they have ben arguing for a few days.  Her wife told her that she has her support. That was significant to her.   Lita Mains [facility] 01/06/2023, 9:18 PM
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
## 2023-01-07 MED ORDER — TRAZODONE HCL 100 MG PO TABS: 100.0000 mg | ORAL_TABLET | Freq: Every evening | ORAL | Status: DC | PRN

## 2023-01-07 MED ORDER — TRAZODONE HCL 50 MG PO TABS
50.0000 mg | ORAL_TABLET | Freq: Every evening | ORAL | Status: DC | PRN
  Administered 2023-01-07 – 2023-01-10 (×4): 50 mg via ORAL
  Filled 2023-01-07: qty 3
  Filled 2023-01-07 (×4): qty 1

## 2023-01-07 MED ORDER — POVIDONE-IODINE 10 % EX SWAB: 2.0000 | Freq: Once | CUTANEOUS | Status: DC

## 2023-01-07 MED ORDER — NICOTINE 21 MG/24HR TD PT24
21.0000 mg | MEDICATED_PATCH | Freq: Every day | TRANSDERMAL | Status: DC
  Administered 2023-01-08 – 2023-01-11 (×4): 21 mg via TRANSDERMAL
  Filled 2023-01-07 (×6): qty 1

## 2023-01-07 MED ORDER — ESCITALOPRAM OXALATE 20 MG PO TABS
20.0000 mg | ORAL_TABLET | Freq: Every day | ORAL | Status: DC
  Administered 2023-01-08 – 2023-01-11 (×4): 20 mg via ORAL
  Filled 2023-01-07 (×3): qty 1
  Filled 2023-01-07: qty 7
  Filled 2023-01-07 (×2): qty 1

## 2023-01-07 MED ORDER — QUETIAPINE FUMARATE 50 MG PO TABS
150.0000 mg | ORAL_TABLET | Freq: Every day | ORAL | Status: DC
  Administered 2023-01-07: 150 mg via ORAL
  Filled 2023-01-07 (×3): qty 3

## 2023-01-07 MED ORDER — IBUPROFEN 400 MG PO TABS: 400.0000 mg | ORAL_TABLET | Freq: Three times a day (TID) | ORAL | Status: DC | PRN

## 2023-01-07 NOTE — Progress Notes (Signed)
   01/07/23 0100  Psych Admission Type (Psych Patients Only)  Admission Status Voluntary  Psychosocial Assessment  Patient Complaints Anxiety  Eye Contact Fair  Facial Expression Sad  Affect Anxious  Speech Logical/coherent  Interaction Assertive  Motor Activity Slow  Appearance/Hygiene Disheveled  Behavior Characteristics Cooperative  Mood Anxious;Sad;Pleasant  Aggressive Behavior  Effect No apparent injury  Thought Process  Coherency WDL  Content WDL  Delusions WDL  Perception Hallucinations  Hallucination Auditory;Visual  Judgment Impaired  Confusion WDL  Danger to Self  Current suicidal ideation? Denies  Danger to Others  Danger to Others None reported or observed

## 2023-01-07 NOTE — [facility] Group Notes (Signed)
[facility] Group Notes:  (Nursing/MHT/Case Management/Adjunct)  Date:  01/07/2023  Time:  9:52 AM  Type of Therapy:  Psychoeducational Skills  Participation Level:  Active  Participation Quality:  Appropriate, Attentive, and Supportive  Affect:  Appropriate  Cognitive:  Alert and Appropriate  Insight:  Appropriate and Good  Engagement in Group:  Engaged  Modes of Intervention:  Orientation  Summary of Progress/Problems: Discussed goals and expectation for the day.  Stated goal is anger management. Kristy Reid 01/07/2023, 9:52 AM

## 2023-01-07 NOTE — Group Note (Signed)
Date:  01/07/2023 Time:  9:10 PM  Group Topic/Focus:  Wrap-Up Group:   The focus of this group is to help patients review their daily goal of treatment and discuss progress on daily workbooks.    Participation Level:  Active  Participation Quality:  Appropriate  Affect:  Appropriate  Cognitive:  Appropriate  Insight: Appropriate  Engagement in Group:  Engaged  Modes of Intervention:  Education and Exploration  Additional Comments:  Patient attended and participated in group tonight. She reports that her strength is advocating for others and her weakness is being too nice to people, sometimes they take advantage of you.  Lita Mains [facility] 01/07/2023, 9:10 PM

## 2023-01-07 NOTE — Plan of Care (Signed)
  Problem: Education: Goal: Emotional status will improve Outcome: Progressing Goal: Mental status will improve Outcome: Progressing   Problem: Activity: Goal: Interest or engagement in activities will improve Outcome: Progressing Goal: Sleeping patterns will improve Outcome: Progressing   Problem: Physical Regulation: Goal: Ability to maintain clinical measurements within normal limits will improve Outcome: Progressing   Problem: Education: Goal: Emotional status will improve Outcome: Progressing

## 2023-01-07 NOTE — Progress Notes (Signed)
   01/07/23 1600  Psych Admission Type (Psych Patients Only)  Admission Status Voluntary  Psychosocial Assessment  Patient Complaints Anxiety  Eye Contact Fair  Facial Expression Anxious;Animated  Affect Anxious  Speech Logical/coherent  Interaction Assertive  Motor Activity Other (Comment)  Appearance/Hygiene Improved  Behavior Characteristics Cooperative  Mood Anxious  Thought Process  Coherency WDL  Content WDL  Delusions None reported or observed  Perception Hallucinations  Hallucination Auditory  Judgment Impaired  Confusion WDL  Danger to Self  Current suicidal ideation? Denies  Danger to Others  Danger to Others None reported or observed

## 2023-01-07 NOTE — Plan of Care (Signed)
Problem: Education: Goal: Knowledge of [facility] Education information/materials will improve Outcome: Progressing Goal: Emotional status will improve Outcome: Progressing Goal: Mental status will improve Outcome: Progressing Goal: Verbalization of understanding the information provided will improve Outcome: Progressing   Problem: Activity: Goal: Interest or engagement in activities will improve Outcome: Progressing Goal: Sleeping patterns will improve Outcome: Progressing   Problem: Coping: Goal: Ability to verbalize frustrations and anger appropriately will improve Outcome: Progressing Goal: Ability to demonstrate self-control will improve Outcome: Progressing   Problem: Health Behavior/Discharge Planning: Goal: Identification of resources available to assist in meeting health care needs will improve Outcome: Progressing Goal: Compliance with treatment plan for underlying cause of condition will improve Outcome: Progressing   Problem: Physical Regulation: Goal: Ability to maintain clinical measurements within normal limits will improve Outcome: Progressing   Problem: Safety: Goal: Periods of time without injury will increase Outcome: Progressing   Problem: Education: Goal: Knowledge of [facility] Education information/materials will improve Outcome: Progressing Goal: Emotional status will improve Outcome: Progressing Goal: Mental status will improve Outcome: Progressing Goal: Verbalization of understanding the information provided will improve Outcome: Progressing   Problem: Activity: Goal: Interest or engagement in activities will improve Outcome: Progressing Goal: Sleeping patterns will improve Outcome: Progressing   Problem: Coping: Goal: Ability to verbalize frustrations and anger appropriately will improve Outcome: Progressing Goal: Ability to demonstrate self-control will improve Outcome: Progressing   Problem: Health  Behavior/Discharge Planning: Goal: Identification of resources available to assist in meeting health care needs will improve Outcome: Progressing Goal: Compliance with treatment plan for underlying cause of condition will improve Outcome: Progressing   Problem: Physical Regulation: Goal: Ability to maintain clinical measurements within normal limits will improve Outcome: Progressing   Problem: Safety: Goal: Periods of time without injury will increase Outcome: Progressing   Problem: Education: Goal: Ability to make informed decisions regarding treatment will improve Outcome: Progressing   Problem: Coping: Goal: Coping ability will improve Outcome: Progressing   Problem: Health Behavior/Discharge Planning: Goal: Identification of resources available to assist in meeting health care needs will improve Outcome: Progressing   Problem: Medication: Goal: Compliance with prescribed medication regimen will improve Outcome: Progressing   Problem: Self-Concept: Goal: Ability to disclose and discuss suicidal ideas will improve Outcome: Progressing Goal: Will verbalize positive feelings about self Outcome: Progressing Note: Patient is on track. Patient will maintain adherence    Problem: Education: Goal: Utilization of techniques to improve thought processes will improve Outcome: Progressing Goal: Knowledge of the prescribed therapeutic regimen will improve Outcome: Progressing   Problem: Activity: Goal: Interest or engagement in leisure activities will improve Outcome: Progressing Goal: Imbalance in normal sleep/wake cycle will improve Outcome: Progressing   Problem: Coping: Goal: Coping ability will improve Outcome: Progressing Goal: Will verbalize feelings Outcome: Progressing   Problem: Health Behavior/Discharge Planning: Goal: Ability to make decisions will improve Outcome: Progressing Goal: Compliance with therapeutic regimen will improve Outcome: Progressing    Problem: Role Relationship: Goal: Will demonstrate positive changes in social behaviors and relationships Outcome: Progressing   Problem: Safety: Goal: Ability to disclose and discuss suicidal ideas will improve Outcome: Progressing Goal: Ability to identify and utilize support systems that promote safety will improve Outcome: Progressing   Problem: Self-Concept: Goal: Will verbalize positive feelings about self Outcome: Progressing Goal: Level of anxiety will decrease Outcome: Progressing   Problem: Activity: Goal: Will identify at least one activity in which they can participate Outcome: Progressing   Problem: Coping: Goal: Ability to identify and develop effective coping  behavior will improve Outcome: Progressing Goal: Ability to interact with others will improve Outcome: Progressing Goal: Demonstration of participation in decision-making regarding own care will improve Outcome: Progressing Goal: Ability to use eye contact when communicating with others will improve Outcome: Progressing   Problem: Health Behavior/Discharge Planning: Goal: Identification of resources available to assist in meeting health care needs will improve Outcome: Progressing

## 2023-01-07 NOTE — Progress Notes (Signed)
   01/07/23 2130  Psych Admission Type (Psych Patients Only)  Admission Status Voluntary  Psychosocial Assessment  Patient Complaints Anxiety  Eye Contact Fair  Facial Expression Sad  Affect Anxious  Speech Logical/coherent  Interaction Assertive  Motor Activity Slow  Appearance/Hygiene Disheveled  Behavior Characteristics Cooperative  Mood Anxious  Aggressive Behavior  Effect No apparent injury  Thought Process  Coherency WDL  Content WDL  Delusions WDL  Perception Hallucinations  Hallucination Auditory;Visual  Judgment Impaired  Confusion WDL  Danger to Self  Current suicidal ideation? Denies  Danger to Others  Danger to Others None reported or observed
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
## 2023-01-07 NOTE — Group Note (Signed)
Occupational Therapy Group Note  Group Topic: Sleep Hygiene  Group Date: 01/07/2023 Start Time: 1430 End Time: 1500 Facilitators: Ted Mcalpine, OT   Group Description: Group encouraged increased participation and engagement through topic focused on sleep hygiene. Patients reflected on the quality of sleep they typically receive and identified areas that need improvement. Group was given background information on sleep and sleep hygiene, including common sleep disorders. Group members also received information on how to improve one's sleep and introduced a sleep diary as a tool that can be utilized to track sleep quality over a length of time. Group session ended with patients identifying one or more strategies they could utilize or implement into their sleep routine in order to improve overall sleep quality.        Therapeutic Goal(s):  Identify one or more strategies to improve overall sleep hygiene  Identify one or more areas of sleep that are negatively impacted (sleep too much, too little, etc)     Participation Level: Engaged   Participation Quality: Independent   Behavior: Appropriate   Speech/Thought Process: Relevant   Affect/Mood: Appropriate   Insight: Fair   Judgement: Fair      Modes of Intervention: Education  Patient Response to Interventions:  Attentive   Plan: Continue to engage patient in OT groups 2 - 3x/week.  01/07/2023  Ted Mcalpine, OT   Kerrin Champagne, OT

## 2023-01-07 NOTE — Plan of Care (Signed)
CHL Tonsillectomy/Adenoidectomy, Postoperative PEDS care plan entered in error.

## 2023-01-07 NOTE — Plan of Care (Signed)
  Problem: Education: Goal: Emotional status will improve Outcome: Progressing Goal: Mental status will improve Outcome: Progressing   Problem: Activity: Goal: Interest or engagement in activities will improve Outcome: Progressing Goal: Sleeping patterns will improve Outcome: Progressing

## 2023-01-08 ENCOUNTER — Encounter: Admission: RE | Payer: Self-pay | Source: Ambulatory Visit

## 2023-01-08 ENCOUNTER — Ambulatory Visit: Admission: RE | Admit: 2023-01-08 | Payer: MEDICAID | Source: Ambulatory Visit | Admitting: Obstetrics and Gynecology

## 2023-01-08 DIAGNOSIS — Z01818 Encounter for other preprocedural examination: Secondary | ICD-10-CM

## 2023-01-08 DIAGNOSIS — N921 Excessive and frequent menstruation with irregular cycle: Secondary | ICD-10-CM

## 2023-01-08 LAB — URINALYSIS, ROUTINE W REFLEX MICROSCOPIC
Bacteria, UA: NONE SEEN
Bilirubin Urine: NEGATIVE
Glucose, UA: NEGATIVE mg/dL
Hgb urine dipstick: NEGATIVE
Ketones, ur: NEGATIVE mg/dL
Nitrite: NEGATIVE
Protein, ur: NEGATIVE mg/dL
Specific Gravity, Urine: 1.015 (ref 1.005–1.030)
pH: 6 (ref 5.0–8.0)

## 2023-01-08 SURGERY — XI ROBOTIC ASSISTED LAPAROSCOPIC HYSTERECTOMY AND SALPINGECTOMY
Anesthesia: Choice

## 2023-01-08 MED ORDER — QUETIAPINE FUMARATE 200 MG PO TABS
200.0000 mg | ORAL_TABLET | Freq: Every day | ORAL | Status: DC
  Administered 2023-01-08: 200 mg via ORAL
  Filled 2023-01-08 (×2): qty 1

## 2023-01-08 MED ORDER — CEFAZOLIN SODIUM-DEXTROSE 2-4 GM/100ML-% IV SOLN
2.0000 g | INTRAVENOUS | Status: DC
  Filled 2023-01-08: qty 100

## 2023-01-08 MED ORDER — POLYETHYLENE GLYCOL 3350 17 G PO PACK
17.0000 g | PACK | Freq: Every day | ORAL | Status: DC | PRN
  Administered 2023-01-10: 17 g via ORAL
  Filled 2023-01-08: qty 1

## 2023-01-08 MED ORDER — HYDROCORTISONE 1 % EX CREA: TOPICAL_CREAM | Freq: Every day | CUTANEOUS | Status: DC | PRN

## 2023-01-08 MED ORDER — LIDOCAINE 5 % EX PTCH
2.0000 | MEDICATED_PATCH | CUTANEOUS | Status: DC
  Administered 2023-01-08 – 2023-01-11 (×4): 2 via TRANSDERMAL
  Filled 2023-01-08 (×7): qty 2

## 2023-01-08 MED ORDER — DIPHENHYDRAMINE HCL 25 MG PO CAPS
25.0000 mg | ORAL_CAPSULE | Freq: Every day | ORAL | Status: DC | PRN
  Administered 2023-01-08 – 2023-01-10 (×3): 25 mg via ORAL
  Filled 2023-01-08 (×3): qty 1

## 2023-01-08 MED ORDER — BISMUTH SUBSALICYLATE 262 MG PO CHEW: 524.0000 mg | CHEWABLE_TABLET | ORAL | Status: DC | PRN

## 2023-01-08 MED ORDER — ONDANSETRON HCL 4 MG PO TABS: 8.0000 mg | ORAL_TABLET | Freq: Three times a day (TID) | ORAL | Status: DC | PRN

## 2023-01-08 MED ORDER — ALUM & MAG HYDROXIDE-SIMETH 200-200-20 MG/5ML PO SUSP: 30.0000 mL | ORAL | Status: DC | PRN

## 2023-01-08 MED ORDER — SENNA 8.6 MG PO TABS
1.0000 | ORAL_TABLET | Freq: Every evening | ORAL | Status: DC | PRN
  Administered 2023-01-10: 8.6 mg via ORAL
  Filled 2023-01-08: qty 1

## 2023-01-08 NOTE — [facility] Group Notes (Signed)
[facility] Group Notes:  (Nursing/MHT/Case Management/Adjunct) Adult Psychoeducational Group Note  Date:  01/08/2023 Time:  10:34 AM  Group Topic/Focus:  Goals Group:   The focus of this group is to help patients establish daily goals to achieve during treatment and discuss how the patient can incorporate goal setting into their daily lives to aide in recovery. Orientation:   The focus of this group is to educate the patient on the purpose and policies of crisis stabilization and provide a format to answer questions about their admission.  The group details unit policies and expectations of patients while admitted.  Participation Level:  Active  Participation Quality:  Inattentive  Affect:  Defensive  Cognitive:  Lacking  Insight: None  Engagement in Group:  None  Modes of Intervention:  Orientation  Additional Comments:  Pt was disruptive and arguing with peers.  Lucilla Edin 01/08/2023, 10:34 AM

## 2023-01-08 NOTE — Group Note (Signed)
Recreation Therapy Group Note   Group Topic:Team Building  Group Date: 01/08/2023 Start Time: 1007 End Time: 1032 Facilitators: Jayston Trevino-McCall, LRT,CTRS Location: [address] [employer]   Group Topic: Team Building, Problem Solving  Goal Area(s) Addresses:  Patient will effectively work with peer towards shared goal.  Patient will identify skills used to make activity successful.  Patient will identify how skills used during activity can be applied to reach post d/c goals.   Intervention: STEM Activity- Glass blower/designer  Group Description: Tallest Pharmacist, community. In teams of 5-6, patients were given 11 craft pipe cleaners. Using the materials provided, patients were instructed to compete again the opposing team(s) to build the tallest free-standing structure from floor level. The activity was timed; difficulty increased by Clinical research associate as Production designer, theatre/television/film continued.  Systematically resources were removed with additional directions for example, placing one arm behind their back, working in silence, and shape stipulations. LRT facilitated post-activity discussion reviewing team processes and necessary communication skills involved in completion. Patients were encouraged to reflect how the skills utilized, or not utilized, in this activity can be incorporated to positively impact support systems post discharge.  Education: Pharmacist, community, Scientist, physiological, Discharge Planning   Education Outcome: Acknowledges education/In group clarification offered/Needs additional education.    Affect/Mood: Appropriate   Participation Level: Engaged   Participation Quality: Independent   Behavior: Appropriate   Speech/Thought Process: Focused   Insight: Good   Judgement: Good   Modes of Intervention: STEM Activity   Patient Response to Interventions:  Engaged   Education Outcome:  In group clarification offered    Clinical Observations/Individualized Feedback: Pt was engaged and  encouraged peers to get involved and participate. Pt took on a leadership role with peer in getting the tower how they wanted it. Pt explained her and peer had to redo the base of the tower because it wasn't as sturdy as they needed it to be in the beginning. Pt also expressed being able to determine who has your best interest at heart and "dragging you down". Lastly, pt emphasized taking small steps to reaching the goal you have set.     Plan: Continue to engage patient in RT group sessions 2-3x/week.   Adelfo Diebel-McCall, LRT,CTRS 01/08/2023 12:50 PM

## 2023-01-08 NOTE — Group Note (Signed)
Date:  01/08/2023 Time:  1:22 PM  Group Topic/Focus:  Goals Group:   The focus of this group is to help patients establish daily goals to achieve during treatment and discuss how the patient can incorporate goal setting into their daily lives to aide in recovery.    Participation Level:  Did Not Attend  Participation Quality:   NA  Affect:   NA  Cognitive:   NA  Insight: None  Engagement in Group:   NA  Modes of Intervention:   Na  Additional Comments:    Beckie Busing 01/08/2023, 1:22 PM

## 2023-01-08 NOTE — Progress Notes (Signed)
   01/08/23 2200  Psych Admission Type (Psych Patients Only)  Admission Status Voluntary  Psychosocial Assessment  Patient Complaints Anxiety;Depression;Insomnia  Eye Contact Fair  Facial Expression Anxious;Worried  Affect Anxious  Systems analyst;Soft  Interaction Assertive  Motor Activity Slow  Appearance/Hygiene Unremarkable  Behavior Characteristics Cooperative  Mood Anxious  Aggressive Behavior  Effect No apparent injury  Thought Process  Coherency WDL  Content Blaming others  Delusions None reported or observed  Perception WDL  Hallucination None reported or observed  Judgment Impaired  Confusion None  Danger to Self  Current suicidal ideation? Denies  Agreement Not to Harm Self Yes  Description of Agreement verbal contract  Danger to Others  Danger to Others None reported or observed
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
## 2023-01-08 NOTE — Plan of Care (Signed)
  Problem: Education: Goal: Verbalization of understanding the information provided will improve Outcome: Progressing   Problem: Activity: Goal: Interest or engagement in activities will improve Outcome: Progressing Goal: Sleeping patterns will improve Outcome: Progressing   Problem: Health Behavior/Discharge Planning: Goal: Compliance with treatment plan for underlying cause of condition will improve Outcome: Progressing   Problem: Safety: Goal: Periods of time without injury will increase Outcome: Progressing
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
## 2023-01-08 NOTE — Progress Notes (Signed)
Kristy Reid did not attend wrap up group

## 2023-01-09 DIAGNOSIS — F333 Major depressive disorder, recurrent, severe with psychotic symptoms: Secondary | ICD-10-CM | POA: Diagnosis not present

## 2023-01-09 MED ORDER — NITROFURANTOIN MONOHYD MACRO 100 MG PO CAPS
100.0000 mg | ORAL_CAPSULE | Freq: Two times a day (BID) | ORAL | Status: DC
  Administered 2023-01-09 – 2023-01-11 (×5): 100 mg via ORAL
  Filled 2023-01-09 (×4): qty 1
  Filled 2023-01-09: qty 5
  Filled 2023-01-09 (×2): qty 1
  Filled 2023-01-09: qty 5
  Filled 2023-01-09: qty 1

## 2023-01-09 MED ORDER — QUETIAPINE FUMARATE 300 MG PO TABS
300.0000 mg | ORAL_TABLET | Freq: Every day | ORAL | Status: DC
  Administered 2023-01-09 – 2023-01-10 (×2): 300 mg via ORAL
  Filled 2023-01-09 (×3): qty 1
  Filled 2023-01-09: qty 7

## 2023-01-09 NOTE — Plan of Care (Signed)
  Problem: Education: Goal: Knowledge of [facility] Education information/materials will improve Outcome: Progressing   Problem: Education: Goal: Verbalization of understanding the information provided will improve Outcome: Progressing   Problem: Health Behavior/Discharge Planning: Goal: Identification of resources available to assist in meeting health care needs will improve Outcome: Progressing   Problem: Health Behavior/Discharge Planning: Goal: Compliance with treatment plan for underlying cause of condition will improve Outcome: Progressing

## 2023-01-09 NOTE — Group Note (Signed)
Date:  01/09/2023 Time:  10:25 PM  Group Topic/Focus:  Wrap-Up Group:   The focus of this group is to help patients review their daily goal of treatment and discuss progress on daily workbooks.    Participation Level:  Active  Participation Quality:  Appropriate  Affect:  Appropriate  Cognitive:  Appropriate  Insight: Appropriate  Engagement in Group:  Engaged and Supportive  Modes of Intervention:  Socialization  Additional Comments:  Pt attended the evening wrap-up group. Tech introduced the staff for the evening, reminded group of the evening schedule and reminded them to ask for anything they need. PT worked on Education officer, community through music.  Osa Craver 01/09/2023, 10:25 PM

## 2023-01-09 NOTE — Progress Notes (Signed)
   01/09/23 2300  Psych Admission Type (Psych Patients Only)  Admission Status Voluntary  Psychosocial Assessment  Patient Complaints Anxiety  Eye Contact Fair  Facial Expression Anxious  Affect Anxious  Speech Logical/coherent  Interaction Assertive  Motor Activity Slow  Appearance/Hygiene Unremarkable  Behavior Characteristics Cooperative  Mood Anxious  Aggressive Behavior  Effect No apparent injury  Thought Process  Coherency WDL  Content Blaming others  Delusions None reported or observed  Perception WDL  Hallucination None reported or observed  Judgment Impaired  Confusion None  Danger to Self  Current suicidal ideation? Denies  Agreement Not to Harm Self Yes  Description of Agreement verbal  Danger to Others  Danger to Others None reported or observed

## 2023-01-09 NOTE — Group Note (Signed)
Date:  01/09/2023 Time:  4:22 PM  Group Topic/Focus:The focus of this group is to engage in a team building activity to discover joy and working together as a team to achieve a goal.       Participation Level:  Active  Participation Quality:  Appropriate and Attentive  Affect:  Appropriate  Cognitive:  Alert and Appropriate  Insight: Appropriate  Engagement in Group:  Engaged and Supportive  Modes of Intervention:  Activity and Socialization  Additional Comments:  Patients formed 2 teams to compete in trivia. Allaya interacted with her team, helping to answer questions and remaining engaged. [place] remained appropriate throughout group.  Gardiner Barefoot 01/09/2023, 4:22 PM

## 2023-01-09 NOTE — Group Note (Signed)
Date:  01/09/2023 Time:  1:09 PM  Group Topic/Focus:  Goals Group:   The focus of this group is to help patients establish daily goals to achieve during treatment and discuss how the patient can incorporate goal setting into their daily lives to aide in recovery.    Participation Level:  Active  Participation Quality:  Appropriate  Affect:  Appropriate  Cognitive:  Appropriate  Insight: Appropriate  Engagement in Group:  Engaged  Modes of Intervention:  Discussion  Additional Comments:    Laken Lobato D Damian Hofstra 01/09/2023, 1:09 PM

## 2023-01-09 NOTE — Progress Notes (Signed)
   01/09/23 0645  15 Minute Checks  Location Cafeteria  Visual Appearance Calm  Behavior Composed  Sleep (Behavioral Health Patients Only)  Calculate sleep? (Click Yes once per 24 hr at 0600 safety check) Yes  Documented sleep last 24 hours 7
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
## 2023-01-10 MED ORDER — QUETIAPINE FUMARATE 50 MG PO TABS
50.0000 mg | ORAL_TABLET | Freq: Two times a day (BID) | ORAL | Status: DC
  Administered 2023-01-10 – 2023-01-11 (×3): 50 mg via ORAL
  Filled 2023-01-10 (×3): qty 1
  Filled 2023-01-10: qty 14
  Filled 2023-01-10: qty 1
  Filled 2023-01-10: qty 14

## 2023-01-10 MED ORDER — QUETIAPINE FUMARATE 50 MG PO TABS
50.0000 mg | ORAL_TABLET | Freq: Once | ORAL | Status: AC
  Administered 2023-01-10: 50 mg via ORAL
  Filled 2023-01-10: qty 1

## 2023-01-10 MED ORDER — SENNA 8.6 MG PO TABS
1.0000 | ORAL_TABLET | Freq: Every day | ORAL | Status: DC | PRN
  Administered 2023-01-11: 8.6 mg via ORAL
  Filled 2023-01-10: qty 1

## 2023-01-10 NOTE — Progress Notes (Signed)
   01/10/23 2200  Psych Admission Type (Psych Patients Only)  Admission Status Voluntary  Psychosocial Assessment  Patient Complaints Anxiety  Eye Contact Fair  Facial Expression Anxious  Affect Anxious  Speech Logical/coherent  Interaction Assertive  Motor Activity Slow  Appearance/Hygiene Unremarkable  Behavior Characteristics Cooperative;Appropriate to situation  Mood Anxious  Aggressive Behavior  Effect No apparent injury  Thought Process  Coherency WDL  Content Blaming others  Delusions None reported or observed  Perception WDL  Hallucination None reported or observed  Judgment Impaired  Confusion None  Danger to Self  Current suicidal ideation? Denies  Agreement Not to Harm Self Yes  Description of Agreement verbal  Danger to Others  Danger to Others None reported or observed

## 2023-01-10 NOTE — Plan of Care (Signed)
  Problem: Education: Goal: Knowledge of [facility] Education information/materials will improve Outcome: Progressing Goal: Emotional status will improve Outcome: Progressing Goal: Mental status will improve Outcome: Progressing Goal: Verbalization of understanding the information provided will improve Outcome: Progressing   

## 2023-01-10 NOTE — B[facility] Group Notes (Signed)
Type of Therapy and Topic:  Group Therapy: Gratitude  Participation Level:  Active   Description of Group:   In this group, patients shared and discussed the importance of acknowledging the elements in their lives for which they are grateful and how this can positively impact their mood.  The group discussed how bringing the positive elements of their lives to the forefront of their minds can help with recovery from any illness, physical or mental.  An exercise was done as a group in which a list was made of gratitude items in order to encourage participants to consider other potential positives in their lives.  Therapeutic Goals: Patients will identify one or more item for which they are grateful in each of 6 categories:  people, experiences, things, places, skills, and other. Patients will discuss how it is possible to seek out gratitude in even bad situations. Patients will explore other possible items of gratitude that they could remember.   Summary of Patient Progress:  The patient shared that she would draw and is grateful for family.  Patient's reaction to the group was cooperating and pleasant.  Therapeutic Modalities:   Solution-Focused Therapy Activity
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
## 2023-01-10 NOTE — [facility] Group Notes (Signed)
[facility] Group Notes:  (Nursing/MHT/Case Management/Adjunct)  Date:  01/10/2023  Time:  2:07 PM  Type of Therapy:  Psychoeducational Skills  Participation Level:  Active  Participation Quality:  Appropriate and Attentive  Affect:  Appropriate  Cognitive:  Appropriate  Insight:  Appropriate  Engagement in Group:  Engaged  Modes of Intervention:  Discussion, Education, and Exploration  Summary of Progress/Problems: Patients were given education on positive reframing and how to identify the impact of negative thinking patterns on mental health. Additionally, a podcast was play by Dr. Yetta Glassman on the impact of negative beliefs. Pt attended and was appropriate.   Malva Limes 01/10/2023, 2:07 PM

## 2023-01-10 NOTE — Plan of Care (Signed)

## 2023-01-10 NOTE — Progress Notes (Signed)
Patient states that she thinks she is having a herpes outbreak and is requesting acyclovir. Message sent to providers. Patient remains safe at this time.

## 2023-01-10 NOTE — [facility] Group Notes (Signed)
[facility] Group Notes:  (Nursing/MHT/Case Management/Adjunct)  Date:  01/10/2023  Time:  9:40 PM  Type of Therapy:  The focus of this group is to help patients review their daily goal of treatment and discuss progress on daily workbooks.   Participation Level:  Active  Participation Quality:  Appropriate, Sharing, and Supportive  Affect:  Appropriate  Cognitive:  Alert and Appropriate  Insight:  Appropriate  Engagement in Group:  Engaged and Supportive  Modes of Intervention:  Socialization and Support  Summary of Progress/Problems: Pt attended group  Kristy Reid 01/10/2023, 9:40 PM

## 2023-01-10 NOTE — Progress Notes (Addendum)
Patient ID: Kristy Reid, female   DOB: [birth year], 27 y.o.   MRN: [id number] Pt presents with depressed mood, affect irritable. Sebastian reports she is '' feeling tired, and anxious and I didn't sleep and I'm still hearing voices. '' Pt shares she feels '' very over stimulated with all the loud noise in the dayroom and then when people are talking and I hear the TV it makes my voices worse and I start hearing voices to hurt others because it's just too much. '' She shares that she can often hear voices and that at times they tell her to harm herself but no plan to do so. '' She remains with somatic complaints but is compliant with medications. Support and reassurance provided and above discussed with treatment team including MD, NP and SW. Made aware. Pt also reported some constipation, requested sennakot. Prn given. Pt is safe, able to make needs known. Pt completed self inventory and rates her anxiety at 10/10 on scale 10 being worst 0 being none. Pt rates her depression at 6/10 on scale 10 being worst 0 being none. Pt goal is to work on [employer] and use coping skills '' Will con't to monitor.

## 2023-01-11 ENCOUNTER — Encounter (HOSPITAL_COMMUNITY): Payer: Self-pay

## 2023-01-11 MED ORDER — NICOTINE 21 MG/24HR TD PT24: 21.0000 mg | MEDICATED_PATCH | Freq: Every day | TRANSDERMAL | 0 refills | Status: DC

## 2023-01-11 MED ORDER — QUETIAPINE FUMARATE 50 MG PO TABS: 50.0000 mg | ORAL_TABLET | Freq: Two times a day (BID) | ORAL | 0 refills | Status: DC

## 2023-01-11 MED ORDER — TRAZODONE HCL 50 MG PO TABS: 50.0000 mg | ORAL_TABLET | Freq: Every evening | ORAL | 0 refills | Status: DC | PRN

## 2023-01-11 MED ORDER — HYDROXYZINE HCL 25 MG PO TABS: 25.0000 mg | ORAL_TABLET | Freq: Three times a day (TID) | ORAL | 0 refills | Status: DC | PRN

## 2023-01-11 MED ORDER — QUETIAPINE FUMARATE 300 MG PO TABS: 300.0000 mg | ORAL_TABLET | Freq: Every day | ORAL | 0 refills | Status: DC

## 2023-01-11 MED ORDER — NITROFURANTOIN MONOHYD MACRO 100 MG PO CAPS: 100.0000 mg | ORAL_CAPSULE | Freq: Two times a day (BID) | ORAL | 0 refills | Status: DC

## 2023-01-11 MED ORDER — ESCITALOPRAM OXALATE 20 MG PO TABS: 20.0000 mg | ORAL_TABLET | Freq: Every day | ORAL | 0 refills | Status: DC

## 2023-01-11 NOTE — Transportation (Signed)
01/11/2023  Kristy Reid DOB: 05/07/1995 MRN: [id number]   RIDER WAIVER AND RELEASE OF LIABILITY  For the purposes of helping with transportation needs, [facility] partners with outside transportation providers (taxi companies, [place], Catering manager.) to give [employer] patients or other approved people the choice of on-demand rides [employer]") to our buildings for non-emergency visits.  By using [employer], I, the person signing this document, on behalf of myself and/or any legal minors (in my care using the [employer]), agree:  Science writer given to me are supplied by independent, outside transportation providers who do not work for, or have any affiliation with, [employer]. [place] is not a transportation company. [facility] has no control over the quality or safety of the rides I get using [employer]. [place] has no control over whether any outside ride will happen on time or not. [facility] gives no guarantee on the reliability, quality, safety, or availability on any rides, or that no mistakes will happen. I know and accept that traveling by vehicle (car, truck, SVU, Zenaida Niece, bus, taxi, etc.) has risks of serious injuries such as disability, being paralyzed, and death. I know and agree the risk of using [employer] is mine alone, and not [employer]. Transport Services are provided "as is" and as are available. The transportation providers are in charge for all inspections and care of the vehicles used to provide these rides. I agree not to take legal action against [place] [facility], its agents, employees, officers, directors, representatives, insurers, attorneys, assigns, successors, subsidiaries, and affiliates at any time for any reasons related directly or indirectly to using [employer]. I also agree not to take legal action against [facility] or its affiliates for any injury, death, or damage to property caused by or related to using  [employer]. I have read this Waiver and Release of Liability, and I understand the terms used in it and their legal meaning. This Waiver is freely and voluntarily given with the understanding that my right (or any legal minors) to legal action against [facility] relating to [employer] is knowingly given up to use these services.   I attest that I read the Ride Waiver and Release of Liability to Kristy Reid, gave Ms. [place] the opportunity to ask questions and answered the questions asked (if any). I affirm that Kristy Reid then provided consent for assistance with transportation.

## 2023-01-11 NOTE — Progress Notes (Signed)
Discharge Note:  Patient denies SI/HI/AVH at this time. Discharge instructions, AVS, prescriptions, and transition record gone over with patient. Patient agrees to comply with medication management, follow-up visit, and outpatient therapy. Patient belongings returned to patient. Patient questions and concerns addressed and answered. Patient ambulatory off unit. Patient discharged to home with wife.

## 2023-01-11 NOTE — Plan of Care (Signed)
  Problem: Coping: Goal: Ability to verbalize frustrations and anger appropriately will improve Outcome: Progressing Goal: Ability to demonstrate self-control will improve Outcome: Progressing   Problem: Health Behavior/Discharge Planning: Goal: Identification of resources available to assist in meeting health care needs will improve Outcome: Progressing Goal: Compliance with treatment plan for underlying cause of condition will improve Outcome: Progressing   Problem: Physical Regulation: Goal: Ability to maintain clinical measurements within normal limits will improve Outcome: Progressing

## 2023-01-11 NOTE — Group Note (Signed)
Date:  01/11/2023 Time:  10:29 AM  Group Topic/Focus:  Goals Group:   The focus of this group is to help patients establish daily goals to achieve during treatment and discuss how the patient can incorporate goal setting into their daily lives to aide in recovery.    Participation Level:  Minimal  Participation Quality:  Inattentive  Affect:  Blunted  Cognitive:  Appropriate  Insight: Good  Engagement in Group:  Limited  Modes of Intervention:  Discussion  Additional Comments:     Reymundo Poll 01/11/2023, 10:29 AM

## 2023-01-11 NOTE — Group Note (Signed)
Recreation Therapy Group Note   Group Topic:Problem Solving  Group Date: 01/11/2023 Start Time: 0935 End Time: 1000 Facilitators: Ta Fair-McCall, LRT,CTRS Location: [address] Dayroom   Goal Area(s) Addresses:  Patient will effectively work with peer towards shared goal.  Patient will identify skills used to make activity successful.  Patient will identify how skills used during activity can be used to reach post d/c goals.   Intervention: STEM Activity  Group Description: Stage manager. In teams of 3-5, patients were given 12 plastic drinking straws and an equal length of masking tape. Using the materials provided, patients were asked to build a landing pad to catch a golf ball dropped from approximately 5 feet in the air. All materials were required to be used by the team in their design. LRT facilitated post-activity discussion.  Education: Pharmacist, community, Scientist, physiological, Discharge Planning   Education Outcome: Acknowledges education/In group clarification offered/Needs additional education.    Affect/Mood: N/A   Participation Level: Did not attend    Clinical Observations/Individualized Feedback:     Plan: Continue to engage patient in RT group sessions 2-3x/week.   Moyses Pavey-McCall, LRT,CTRS  01/11/2023 12:31 PM

## 2023-01-11 NOTE — BH IP Treatment Plan (Signed)
Interdisciplinary Treatment and Diagnostic Plan Update  01/11/2023 Time of Session: 11:55AM - UPDATE Kristy Reid MRN: [id number]  Principal Diagnosis: MDD (major depressive disorder), recurrent, severe, with psychosis (HCC)  Secondary Diagnoses: Principal Problem:   MDD (major depressive disorder), recurrent, severe, with psychosis (HCC) Active Problems:   Cannabis use disorder, severe, dependence (HCC)   Tobacco use disorder   GAD (generalized anxiety disorder)   Current Medications:  Current Facility-Administered Medications  Medication Dose Route Frequency Provider Last Rate Last Admin   acetaminophen (TYLENOL) tablet 650 mg  650 mg Oral Q6H PRN Onuoha, Chinwendu V, NP   650 mg at 01/09/23 1153   albuterol (VENTOLIN HFA) 108 (90 Base) MCG/ACT inhaler 2 puff  2 puff Inhalation Q6H PRN Kizzie Ide B, MD   2 puff at 01/07/23 1345   alum & mag hydroxide-simeth (MAALOX/MYLANTA) 200-200-20 MG/5ML suspension 30 mL  30 mL Oral Q4H PRN Augusto Gamble, MD       bismuth subsalicylate (PEPTO BISMOL) chewable tablet 524 mg  524 mg Oral Q3H PRN Augusto Gamble, MD       lidocaine (LIDODERM) 5 % 2 patch  2 patch Transdermal Q24H Kizzie Ide B, MD   2 patch at 01/11/23 1002   And   diphenhydrAMINE (BENADRYL) capsule 25 mg  25 mg Oral Daily PRN Kizzie Ide B, MD   25 mg at 01/10/23 1010   docusate sodium (COLACE) capsule 100 mg  100 mg Oral BID Kizzie Ide B, MD   100 mg at 01/11/23 0721   escitalopram (LEXAPRO) tablet 20 mg  20 mg Oral Daily Kizzie Ide B, MD   20 mg at 01/11/23 0721   feeding supplement (ENSURE ENLIVE / ENSURE PLUS) liquid 237 mL  237 mL Oral TID BM Kizzie Ide B, MD   237 mL at 01/11/23 0934   ferrous sulfate tablet 325 mg  325 mg Oral TID Kizzie Ide B, MD   325 mg at 01/10/23 1121   fluticasone (FLONASE) 50 MCG/ACT nasal spray 1 spray  1 spray Each Nare Daily Kizzie Ide B, MD   1 spray at 01/11/23 0719   fluticasone (FLOVENT HFA) 110 MCG/ACT inhaler 2  puff  2 puff Inhalation BID Kizzie Ide B, MD   2 puff at 01/11/23 0720   hydrocortisone cream 1 %   Topical Daily PRN Lance Muss, MD       hydrOXYzine (ATARAX) tablet 25 mg  25 mg Oral TID PRN Onuoha, Chinwendu V, NP   25 mg at 01/09/23 2103   ibuprofen (ADVIL) tablet 400 mg  400 mg Oral Q8H PRN Lance Muss, MD       LORazepam (ATIVAN) tablet 2 mg  2 mg Oral TID PRN Onuoha, Chinwendu V, NP       Or   LORazepam (ATIVAN) injection 2 mg  2 mg Intramuscular TID PRN Onuoha, Chinwendu V, NP       metoprolol tartrate (LOPRESSOR) tablet 25 mg  25 mg Oral BID Kizzie Ide B, MD   25 mg at 01/11/23 5284   nicotine (NICODERM CQ - dosed in mg/24 hours) patch 21 mg  21 mg Transdermal Daily Kizzie Ide B, MD   21 mg at 01/11/23 1324   nitrofurantoin (macrocrystal-monohydrate) (MACROBID) capsule 100 mg  100 mg Oral Colin Mulders, MD   100 mg at 01/11/23 0721   ondansetron (ZOFRAN) tablet 8 mg  8 mg Oral Q8H PRN Augusto Gamble, MD       polyethylene glycol (  MIRALAX / GLYCOLAX) packet 17 g  17 g Oral Daily PRN Augusto Gamble, MD   17 g at 01/10/23 1124   QUEtiapine (SEROQUEL) tablet 300 mg  300 mg Oral QHS Augusto Gamble, MD   300 mg at 01/10/23 2132   QUEtiapine (SEROQUEL) tablet 50 mg  50 mg Oral BID Augusto Gamble, MD   50 mg at 01/11/23 1610   senna (SENOKOT) tablet 8.6 mg  1 tablet Oral Daily PRN Augusto Gamble, MD   8.6 mg at 01/11/23 9604   traZODone (DESYREL) tablet 50 mg  50 mg Oral QHS PRN Lance Muss, MD   50 mg at 01/10/23 2132   PTA Medications: Medications Prior to Admission  Medication Sig Dispense Refill Last Dose   medroxyPROGESTERone (DEPO-PROVERA) 150 MG/ML injection Inject 150 mg into the muscle every 3 (three) months.      albuterol (VENTOLIN HFA) 108 (90 Base) MCG/ACT inhaler Inhale 2 puffs into the lungs every 6 (six) hours as needed for wheezing or shortness of breath.      Ferrous Sulfate (IRON) 325 (65 Fe) MG TABS Take 1 tablet by mouth 3 (three) times daily.       fluticasone (FLONASE) 50 MCG/ACT nasal spray Place 1 spray into both nostrils daily.      fluticasone (FLOVENT HFA) 110 MCG/ACT inhaler Inhale 2 puffs into the lungs 2 (two) times daily.      meclizine (ANTIVERT) 25 MG tablet Take 25 mg by mouth 3 (three) times daily as needed for dizziness.      meloxicam (MOBIC) 7.5 MG tablet Take 7.5 mg by mouth daily.      metoprolol tartrate (LOPRESSOR) 25 MG tablet Take 25 mg by mouth 2 (two) times daily.      promethazine (PHENERGAN) 25 MG tablet Take 1 tablet (25 mg total) by mouth every 6 (six) hours as needed for nausea or vomiting. (Patient not taking: Reported on 12/31/2022) 12 tablet 0     Patient Stressors: Marital or family conflict   Medication change or noncompliance    Patient Strengths: Automotive engineer for treatment/growth   Treatment Modalities: Medication Management, Group therapy, Case management,  1 to 1 session with clinician, Psychoeducation, Recreational therapy.   Physician Treatment Plan for Primary Diagnosis: MDD (major depressive disorder), recurrent, severe, with psychosis (HCC) Long Term Goal(s):     Short Term Goals: Ability to identify changes in lifestyle to reduce recurrence of condition will improve Ability to verbalize feelings will improve Ability to disclose and discuss suicidal ideas Ability to demonstrate self-control will improve Ability to identify and develop effective coping behaviors will improve Ability to maintain clinical measurements within normal limits will improve Compliance with prescribed medications will improve Ability to identify triggers associated with substance abuse/mental health issues will improve  Medication Management: Evaluate patient's response, side effects, and tolerance of medication regimen.  Therapeutic Interventions: 1 to 1 sessions, Unit Group sessions and Medication administration.  Evaluation of Outcomes: Progressing  Physician Treatment Plan for  Secondary Diagnosis: Principal Problem:   MDD (major depressive disorder), recurrent, severe, with psychosis (HCC) Active Problems:   Cannabis use disorder, severe, dependence (HCC)   Tobacco use disorder   GAD (generalized anxiety disorder)  Long Term Goal(s):     Short Term Goals: Ability to identify changes in lifestyle to reduce recurrence of condition will improve Ability to verbalize feelings will improve Ability to disclose and discuss suicidal ideas Ability to demonstrate self-control will improve Ability to identify and  develop effective coping behaviors will improve Ability to maintain clinical measurements within normal limits will improve Compliance with prescribed medications will improve Ability to identify triggers associated with substance abuse/mental health issues will improve     Medication Management: Evaluate patient's response, side effects, and tolerance of medication regimen.  Therapeutic Interventions: 1 to 1 sessions, Unit Group sessions and Medication administration.  Evaluation of Outcomes: Progressing   RN Treatment Plan for Primary Diagnosis: MDD (major depressive disorder), recurrent, severe, with psychosis (HCC) Long Term Goal(s): Knowledge of disease and therapeutic regimen to maintain health will improve  Short Term Goals: Ability to remain free from injury will improve, Ability to verbalize frustration and anger appropriately will improve, Ability to participate in decision making will improve, Ability to verbalize feelings will improve, Ability to identify and develop effective coping behaviors will improve, and Compliance with prescribed medications will improve  Medication Management: RN will administer medications as ordered by provider, will assess and evaluate patient's response and provide education to patient for prescribed medication. RN will report any adverse and/or side effects to prescribing provider.  Therapeutic Interventions: 1 on 1  counseling sessions, Psychoeducation, Medication administration, Evaluate responses to treatment, Monitor vital signs and CBGs as ordered, Perform/monitor CIWA, COWS, AIMS and Fall Risk screenings as ordered, Perform wound care treatments as ordered.  Evaluation of Outcomes: Progressing   LCSW Treatment Plan for Primary Diagnosis: MDD (major depressive disorder), recurrent, severe, with psychosis (HCC) Long Term Goal(s): Safe transition to appropriate next level of care at discharge, Engage patient in therapeutic group addressing interpersonal concerns.  Short Term Goals: Engage patient in aftercare planning with referrals and resources, Increase social support, Increase emotional regulation, Facilitate acceptance of mental health diagnosis and concerns, Identify triggers associated with mental health/substance abuse issues, and Increase skills for wellness and recovery  Therapeutic Interventions: Assess for all discharge needs, 1 to 1 time with Social worker, Explore available resources and support systems, Assess for adequacy in community support network, Educate family and significant other(s) on suicide prevention, Complete Psychosocial Assessment, Interpersonal group therapy.  Evaluation of Outcomes: Progressing   Progress in Treatment: Attending groups: Yes. Participating in groups: Yes. Taking medication as prescribed: Yes. Toleration medication: Yes. Family/Significant other contact made: No, will contact:   Don Broach ( aunt) [phone] Patient understands diagnosis: Yes. Discussing patient identified problems/goals with staff: Yes. Medical problems stabilized or resolved: Yes. Denies suicidal/homicidal ideation: Yes. Issues/concerns per patient self-inventory: Yes. Other: N/A   New problem(s) identified: No, Describe:  None Reported   New Short Term/Long Term Goal(s):medication stabilization, elimination of SI thoughts, development of comprehensive mental wellness plan.       Patient Goals:  Medication Stabilization   Discharge Plan or Barriers: SW will continue to follow and assess for appropriate referrals and possible discharge planning.      Reason for Continuation of Hospitalization: Anxiety Delusions  Depression Hallucinations Medication stabilization Suicidal ideation Withdrawal symptoms  Estimated Length of Stay: 1 Days     Last 3 [place] Suicide Severity Risk Score: Flowsheet Row [facility] (Current) [facility] 01[facility]04/2023 in [facility] at [facility] Pre-Admission Testing 45 from 12/31/2022 in [facility] PRE [facility]MISS[facility] TEST[facility]  C-SSRS RISK CATEGORY No Risk No Risk No Risk       Last PHQ 2/9 Scores:     No data to display          Scribe for Treatment Team: Kathi Der, LCSWA  01/11/2023 1:05 PM
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
## 2023-01-11 NOTE — Progress Notes (Signed)
  [facility] Adult Case Management Discharge Plan :  Will you be returning to the same living situation after discharge:  Yes,  patient will be returning back with her wife  At discharge, do you have transportation home?: Yes,  Patient will go via taxi around 3:00 pm  Do you have the ability to pay for your medications: Yes,  Patient has [facility] Tailored Plan   Release of information consent forms completed and in the chart;  Patient's signature needed at discharge.  Patient to Follow up at:  Follow-up Information     [facility]. Go on 01/12/2023.   Why: Please call or go to this provider for an assessment appointment, to obtain therapy and medication management services. Contact information: [address] [place] [address] [phone]                 Next level of care provider has access to [facility] Link:no  Safety Planning and Suicide Prevention discussed: Yes,  Wife Lorayne Marek [phone]     Has patient been referred to the Quitline?: Patient refused referral for treatment  Patient has been referred for addiction treatment: No known substance use disorder. Patient does smoke marijuana   Isabella Bowens, LCSWA 01/11/2023, 10:44 AM
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
## 2023-01-11 NOTE — [facility] Suicide Risk Assessment (Signed)
Suicide Risk Assessment  Discharge Assessment    [facility] Discharge Suicide Risk Assessment   Principal Problem: MDD (major depressive disorder), recurrent, severe, with psychosis (HCC) Discharge Diagnoses: Principal Problem:   MDD (major depressive disorder), recurrent, severe, with psychosis (HCC) Active Problems:   Cannabis use disorder, severe, dependence (HCC)   Tobacco use disorder   GAD (generalized anxiety disorder)   Total Time spent with patient: 20 minutes  Kristy Reid is a 27 y.o., female with a past psychiatric history significant for depression, anxiety, schizophrenia, insomnia, and bipolar disorder who presents to the [facility]untary from [facility] for evaluation and management of MDD w/ psychotic feature, GAD, insomnia, Tobacco use disorder, and Marijuana use disorder.   During the patient's hospitalization, patient had extensive initial psychiatric evaluation, and follow-up psychiatric evaluations every day.  Psychiatric diagnoses provided upon initial assessment: MDD (major depressive disorder), recurrent, severe, with psychosis (HCC) Active Problems:   Cannabis use disorder, severe, dependence (HCC)   Tobacco use disorder   GAD (generalized anxiety disorder)  MDD w/ psychotic features  Insomnia - Start lexapro 10 mg daily - Start seroquel 50 mg at bedtime Hypertension  Hx of irregular heartbeat - Continue metoprolol tartrate 25 mg BID Asthma - Continue flovent BID, flonase daily, albuterol q6H PRN Iron-deficiency anemia - Continue ferrous sulfate 325 mg TID Unintentional weight loss - Continue Ensure TID Chronic arthritis of back, knee, and ankle - Start lidocaine 5% patch and benadryl 25 mg PRN Constipation - Start colace 100 mg BID, milk of magnesia PRN Tobacco use -nicotine patch 14 mg / 24 hours ordered.      During the patient's stay, the following medications were changed / started / discontinued, with final adjustments by  discharge: -- seroquel increased to 50 mg qam, 50 mg qpm, 300 mg at bedtime -lexapro increased to 20 mg   During the hospitalization, patient had the following lab / imaging / testing abnormalities which require further evaluation / management / treatment: none   Patient's care was discussed during the interdisciplinary team meeting every day during the hospitalization.   The patient denies any side effects to prescribed psychiatric medication.  Gradually, patient started adjusting to milieu. The patient was evaluated each day by a clinical provider to ascertain response to treatment. Improvement was noted by the patient's report of decreasing symptoms, improved sleep and appetite, affect, medication tolerance, behavior, and participation in unit programming.  Patient was asked each day to complete a self inventory noting mood, mental status, pain, new symptoms, anxiety and concerns.    Symptoms were reported as significantly decreased or resolved completely by discharge.   On day of discharge, the patient reports that their mood is stable. The patient denied having suicidal thoughts for more than 48 hours prior to discharge.  Patient denies having homicidal thoughts.  Patient denies having auditory hallucinations.  Patient denies any visual hallucinations or other symptoms of psychosis. The patient was motivated to continue taking medication with a goal of continued improvement in mental health.   The patient reports their target psychiatric symptoms of depression and anger outbursts responded well to the psychiatric medications, and the patient reports overall benefit other psychiatric hospitalization. Supportive psychotherapy was provided to the patient. The patient also participated in regular group therapy while hospitalized. Coping skills, problem solving as well as relaxation therapies were also part of the unit programming.  Labs were reviewed with the patient, and abnormal results were  discussed with the patient.  The patient is  able to verbalize their individual safety plan to this provider.  # It is recommended to the patient to continue psychiatric medications as prescribed, after discharge from the hospital.    # It is recommended to the patient to follow up with your outpatient psychiatric provider and PCP.  # It was discussed with the patient, the impact of alcohol, drugs, tobacco have been there overall psychiatric and medical wellbeing, and total abstinence from substance use was recommended the patient.ed.  # Prescriptions provided or sent directly to preferred pharmacy at discharge. Patient agreeable to plan. Given opportunity to ask questions. Appears to feel comfortable with discharge.    # In the event of worsening symptoms, the patient is instructed to call the crisis hotline, 911 and or go to the nearest ED for appropriate evaluation and treatment of symptoms. To follow-up with primary care provider for other medical issues, concerns and or health care needs  # Patient was discharged home with a plan to follow up as noted below.    Musculoskeletal: Strength & Muscle Tone: within normal limits Gait & Station: normal Patient leans: N/A  Psychiatric Specialty Exam  General Appearance: appears at stated age, casually dressed and groomed    Behavior: pleasant and cooperative    Psychomotor Activity: no psychomotor agitation or retardation noted    Eye Contact: fair  Speech: normal amount, tone, volume and fluency      Mood: euthymic  Affect: congruent, pleasant and interactive    Thought Process: linear, goal directed, no circumstantial or tangential thought process noted, no racing thoughts or flight of ideas  Descriptions of Associations: intact  Thought Content: no bizarre content, logical and future-oriented  Hallucinations: denies AH, VH , does not appear responding to stimuli  Delusions: no paranoia, delusions of control, grandeur, ideas of  reference, thought broadcasting, and magical thinking  Suicidal Thoughts: denies SI, intention, plan  Homicidal Thoughts: denies HI, intention, plan    Alertness/Orientation: alert and fully oriented    Insight: fair, improved  Judgment: fair, improved    Memory: intact    Executive Functions  Concentration: intact  Attention Span: fair  Recall: intact  Fund of Knowledge: fair      Physical Exam  General: Pleasant, well-appearing. No acute distress. Pulmonary: Normal effort. No wheezing or rales. Skin: No obvious rash or lesions. Neuro: A&Ox3.No focal deficit.     Review of Systems  No reported symptoms Blood pressure 115/80, pulse 100, temperature 98.3 F (36.8 C), temperature source Oral, resp. rate 16, height 5\' 2"  (1.575 m), weight 67.4 kg, SpO2 98%, unknown if currently breastfeeding. Body mass index is 27.18 kg/m.  Mental Status Per Nursing Assessment::   On Admission:  NA  Demographic Factors:  Gay, lesbian, or bisexual orientation Loss Factors: Legal issues Historical Factors: Impulsivity Risk Reduction Factors:   Living with another person, especially a relative and Positive social support   Continued Clinical Symptoms:  Depression:   Aggression Anhedonia  Cognitive Features That Contribute To Risk:  Closed-mindedness    Suicide Risk:  Mild: There are no identifiable suicide plans, no associated intent, mild dysphoria and related symptoms, good self-control (both objective and subjective assessment), few other risk factors, and identifiable protective factors, including available and accessible social support.    Follow-up Information     [facility]. Go on 01/12/2023.   Why: Please call or go to this provider for an assessment appointment, to obtain therapy and medication management services. Contact information: [address]  [place] [address] [phone]                 Plan Of Care/Follow-up  recommendations:  Activity: as tolerated  Diet: heart healthy  Other: -Follow-up with your outpatient psychiatric provider -instructions on appointment date, time, and address (location) are provided to you in discharge paperwork.  -Take your psychiatric medications as prescribed at discharge - instructions are provided to you in the discharge paperwork  -Follow-up with outpatient primary care doctor and other specialists -for management of preventative medicine and chronic medical disease, including: HTN, asthma, iron deficiency anemia, chronic arthritis, constipation  -Testing: Follow-up with outpatient provider for abnormal lab results: none  -Recommend abstinence from alcohol, tobacco, and other illicit drug use at discharge.   -If your psychiatric symptoms recur, worsen, or if you have side effects to your psychiatric medications, call your outpatient psychiatric provider, 911, [phone] or go to the nearest emergency department.  -If suicidal thoughts recur, call your outpatient psychiatric provider, 911, [phone] or go to the nearest emergency department.     Lance Muss, MD 01/11/2023, 11:26 AM

## 2023-01-11 NOTE — [facility] Suicide Risk Assessment (Signed)
[facility] INPATIENT:  Family/Significant Other Suicide Prevention Education  Suicide Prevention Education:  Education Completed; Lorayne Marek [phone] (Wife),  (name of family member/significant other) has been identified by the patient as the family member/significant other with whom the patient will be residing, and identified as the person(s) who will aid the patient in the event of a mental health crisis (suicidal ideations/suicide attempt).  With written consent from the patient, the family member/significant other has been provided the following suicide prevention education, prior to the and/or following the discharge of the patient.  Wife had no safety concerns. Wife shared that her or her mother will help monitor patient medication. Also confirmed that there are no guns or weapons in the home.   The suicide prevention education provided includes the following: Suicide risk factors Suicide prevention and interventions National Suicide Hotline telephone number [facility] assessment telephone number [facility] Emergency Assistance 911 [facility] and/or Residential Mobile Crisis Unit telephone number  Request made of family/significant other to: Remove weapons (e.g., guns, rifles, knives), all items previously/currently identified as safety concern.   Remove drugs/medications (over-the-counter, prescriptions, illicit drugs), all items previously/currently identified as a safety concern.  The family member/significant other verbalizes understanding of the suicide prevention education information provided.  The family member/significant other agrees to remove the items of safety concern listed above.  Isabella Bowens 01/11/2023, 10:42 AM

## 2023-01-11 NOTE — Progress Notes (Signed)
   01/11/23 0900  Psych Admission Type (Psych Patients Only)  Admission Status Voluntary  Psychosocial Assessment  Patient Complaints None  Eye Contact Fair  Facial Expression Anxious  Affect Apprehensive  Speech Logical/coherent  Interaction Assertive  Motor Activity Slow  Appearance/Hygiene Unremarkable  Behavior Characteristics Cooperative;Appropriate to situation  Mood Pleasant  Thought Process  Coherency WDL  Content WDL  Delusions None reported or observed  Perception WDL  Hallucination None reported or observed  Judgment Impaired  Confusion None  Danger to Self  Current suicidal ideation? Denies  Agreement Not to Harm Self Yes  Description of Agreement verbal  Danger to Others  Danger to Others None reported or observed

## 2023-01-11 NOTE — Plan of Care (Signed)
  Problem: Education: Goal: Emotional status will improve Outcome: Progressing Goal: Mental status will improve Outcome: Progressing Goal: Verbalization of understanding the information provided will improve Outcome: Progressing   Problem: Activity: Goal: Interest or engagement in activities will improve Outcome: Progressing Goal: Sleeping patterns will improve Outcome: Progressing   Problem: Coping: Goal: Ability to verbalize frustrations and anger appropriately will improve Outcome: Progressing Goal: Ability to demonstrate self-control will improve Outcome: Progressing   Problem: Health Behavior/Discharge Planning: Goal: Identification of resources available to assist in meeting health care needs will improve Outcome: Progressing Goal: Compliance with treatment plan for underlying cause of condition will improve Outcome: Progressing   

## 2023-01-11 NOTE — [facility] Group Notes (Signed)
Spiritual care group on grief and loss facilitated by Chaplain Dyanne Carrel, [facility]  Group Goal: Support / Education around grief and loss  Members engage in facilitated group support and psycho-social education.  Group Description:  Following introductions and group rules, group members engaged in facilitated group dialogue and support around topic of loss, with particular support around experiences of loss in their lives. Group Identified types of loss (relationships / self / things) and identified patterns, circumstances, and changes that precipitate losses. Reflected on thoughts / feelings around loss, normalized grief responses, and recognized variety in grief experience. Group encouraged individual reflection on safe space and on the coping skills that they are already utilizing.  Group drew on Adlerian / Rogerian and narrative framework  Patient Progress: Kristy Reid attended group and actively engaged and participated in group conversation and activities.  Comments demonstrated good insight.

## 2023-01-13 ENCOUNTER — Emergency Department
Admission: EM | Admit: 2023-01-13 | Discharge: 2023-01-13 | Disposition: A | Discharge status: Home / Self Care | Payer: MEDICAID | Attending: Emergency Medicine | Admitting: Emergency Medicine

## 2023-01-13 ENCOUNTER — Other Ambulatory Visit: Payer: Self-pay

## 2023-01-13 DIAGNOSIS — J45909 Unspecified asthma, uncomplicated: Secondary | ICD-10-CM | POA: Insufficient documentation

## 2023-01-13 DIAGNOSIS — N939 Abnormal uterine and vaginal bleeding, unspecified: Secondary | ICD-10-CM | POA: Insufficient documentation

## 2023-01-13 DIAGNOSIS — Z720 Tobacco use: Secondary | ICD-10-CM | POA: Diagnosis not present

## 2023-01-13 LAB — PREGNANCY, URINE: Preg Test, Ur: NEGATIVE

## 2023-01-13 LAB — CBC
HCT: 39.9 % (ref 36.0–46.0)
Hemoglobin: 13 g/dL (ref 12.0–15.0)
MCH: 31 pg (ref 26.0–34.0)
MCHC: 32.6 g/dL (ref 30.0–36.0)
MCV: 95.2 fL (ref 80.0–100.0)
Platelets: 205 10*3/uL (ref 150–400)
RBC: 4.19 MIL/uL (ref 3.87–5.11)
RDW: 12.3 % (ref 11.5–15.5)
WBC: 4.2 10*3/uL (ref 4.0–10.5)
nRBC: 0 % (ref 0.0–0.2)

## 2023-01-13 LAB — URINALYSIS, ROUTINE W REFLEX MICROSCOPIC
Bilirubin Urine: NEGATIVE
Glucose, UA: NEGATIVE mg/dL
Ketones, ur: NEGATIVE mg/dL
Leukocytes,Ua: NEGATIVE
Nitrite: NEGATIVE
Protein, ur: 30 mg/dL — AB
RBC / HPF: >50 RBC/hpf (ref 0–5)
Specific Gravity, Urine: 1.024 (ref 1.005–1.030)
Squamous Epithelial / HPF: 0 /[HPF] (ref 0–5)
WBC, UA: >50 WBC/hpf (ref 0–5)
pH: 5 (ref 5.0–8.0)

## 2023-01-13 LAB — BASIC METABOLIC PANEL
Anion gap: 6 (ref 5–15)
BUN: 13 mg/dL (ref 6–20)
CO2: 23 mmol/L (ref 22–32)
Calcium: 8.8 mg/dL — ABNORMAL LOW (ref 8.9–10.3)
Chloride: 108 mmol/L (ref 98–111)
Creatinine, Ser: 0.82 mg/dL (ref 0.44–1.00)
GFR, Estimated: >60 mL/min (ref >60)
Glucose, Bld: 92 mg/dL (ref 70–99)
Potassium: 3.6 mmol/L (ref 3.5–5.1)
Sodium: 137 mmol/L (ref 135–145)

## 2023-01-13 LAB — TYPE AND SCREEN
ABO/RH(D): O POS
Antibody Screen: NEGATIVE

## 2023-01-13 MED ORDER — NORETHINDRONE ACETATE 5 MG PO TABS: 10.0000 mg | ORAL_TABLET | Freq: Every day | ORAL | 0 refills | Status: DC

## 2023-01-13 NOTE — ED Provider Notes (Signed)
[facility] Provider Note    Event Date/Time   First MD Initiated Contact with Patient 01/13/23 0932     (approximate)   History   Vaginal Bleeding   HPI  Kristy Reid is a 27 y.o. female who presents today for evaluation of vaginal bleeding.  Patient reports that she was post to get a hysterectomy last week but missed her appointment because she was hospitalized for different reason.  She reports that she put in a tampon today and took a nap and when she woke up she had blood through her pants.  She denies dizziness, abdominal pain, back pain, or shortness of breath.  She is requesting a prescription for hormones to help with the bleeding until she is able to reschedule her hysterectomy.  She has not yet touch base with her OB/GYN to reschedule her surgery.  Patient Active Problem List   Diagnosis Date Noted   MDD (major depressive disorder), recurrent severe, without psychosis (HCC) 01/05/2023   Menorrhagia, premenopausal 02/10/2022   Menorrhagia 02/10/2022   Preterm contractions 06/03/2018   Anemia affecting pregnancy in third trimester 05/30/2018   Limited prenatal care in second trimester 05/19/2018   Depression with suicidal ideation 05/04/2018   Alcohol abuse 05/04/2018   Schizoaffective disorder, bipolar type (HCC) 05/04/2018   GAD (generalized anxiety disorder)    Bipolar 2 disorder (HCC) 11/11/2016   Noncompliance 11/11/2016   Severe recurrent major depression with psychotic features (HCC) 07/18/2016   MDD (major depressive disorder), recurrent, severe, with psychosis (HCC) 07/17/2016   Decreased fetal movement 03/22/2016   Abdominal pain in pregnancy 01/21/2016   Constipation during pregnancy in second trimester 12/26/2015   Overdose 11/29/2015   Supervision of high risk pregnancy, antepartum 11/15/2015   Cannabis use disorder, severe, dependence (HCC) 11/15/2015   Tobacco use disorder 11/15/2015   Asthma 11/15/2015   Borderline  personality disorder (HCC) 11/15/2015   PTSD (post-traumatic stress disorder) 11/15/2015   Severe recurrent major depression without psychotic features (HCC) 11/13/2015          Physical Exam   Triage Vital Signs: ED Triage Vitals  Encounter Vitals Group     BP 01/13/23 0916 130/75     Systolic BP Percentile --      Diastolic BP Percentile --      Pulse Rate 01/13/23 0916 (!) 105     Resp 01/13/23 0916 16     Temp 01/13/23 0916 98.4 F (36.9 C)     Temp src --      SpO2 01/13/23 0916 100 %     Weight 01/13/23 0918 145 lb (65.8 kg)     Height 01/13/23 0918 5\' 2"  (1.575 m)     Head Circumference --      Peak Flow --      Pain Score 01/13/23 0918 8     Pain Loc --      Pain Education --      Exclude from Growth Chart --     Most recent vital signs: Vitals:   01/13/23 0916  BP: 130/75  Pulse: (!) 105  Resp: 16  Temp: 98.4 F (36.9 C)  SpO2: 100%    Physical Exam Vitals and nursing note reviewed.  Constitutional:      General: Awake and alert. No acute distress.    Appearance: Normal appearance. The patient is normal weight.  HENT:     Head: Normocephalic and atraumatic.     Mouth: Mucous membranes are moist.  Eyes:  General: PERRL. Normal EOMs        Right eye: No discharge.        Left eye: No discharge.     Conjunctiva/sclera: Conjunctivae normal.  Cardiovascular:     Rate and Rhythm: Normal rate and regular rhythm.     Pulses: Normal pulses.  Pulmonary:     Effort: Pulmonary effort is normal. No respiratory distress.     Breath sounds: Normal breath sounds.  Abdominal:     Abdomen is soft. There is no abdominal tenderness. No rebound or guarding. No distention. Musculoskeletal:        General: No swelling. Normal range of motion.     Cervical back: Normal range of motion and neck supple.  Skin:    General: Skin is warm and dry.     Capillary Refill: Capillary refill takes less than 2 seconds.     Findings: No rash.  Neurological:     Mental  Status: The patient is awake and alert.      ED Results / Procedures / Treatments   Labs (all labs ordered are listed, but only abnormal results are displayed) Labs Reviewed  BASIC METABOLIC PANEL - Abnormal; Notable for the following components:      Result Value   Calcium 8.8 (*)    All other components within normal limits  URINALYSIS, ROUTINE W REFLEX MICROSCOPIC - Abnormal; Notable for the following components:   Color, Urine YELLOW (*)    APPearance CLOUDY (*)    Hgb urine dipstick LARGE (*)    Protein, ur 30 (*)    Bacteria, UA RARE (*)    All other components within normal limits  CBC  PREGNANCY, URINE  POC URINE PREG, ED  TYPE AND SCREEN     EKG     RADIOLOGY     PROCEDURES:  Critical Care performed:   Procedures   MEDICATIONS ORDERED IN ED: Medications - No data to display   IMPRESSION / MDM / ASSESSMENT AND PLAN / ED COURSE  I reviewed the triage vital signs and the nursing notes.   Differential diagnosis includes, but is not limited to, dysfunctional uterine bleeding, anemia, fibroids.  I reviewed the patient's chart.  Patient was seen in the emergency department on 10/21, 9 days ago at which time she had ingested Phenergan and an " attempt to not feel anything."  She was ultimately transferred to [facility] behavioral health unit and was discharged on 01/11/2023.  She was supposed to have a hysterectomy on 10/25 but this was canceled given her hospitalization.  She has been seen multiple times for vaginal bleeding in the emergency department.  Labs obtained in triage are overall reassuring.  Her H&H is stable.  Pregnancy negative. Per chart review, she was offered oral progesterone which patient has used in the past she reports, though she desires hysterectomy.  I advised her to use schedule a follow-up appointment with her OB/GYN so they can reschedule her hysterectomy.  Patient agrees to do so.  In the meantime she was given oral progesterone per her  request.  We discussed strict return precautions and the importance of close outpatient follow-up.  Patient understands and agrees with plan.  She was discharged in stable condition.  Patient's presentation is most consistent with acute complicated illness / injury requiring diagnostic workup.    FINAL CLINICAL IMPRESSION(S) / ED DIAGNOSES   Final diagnoses:  Abnormal vaginal bleeding     Rx / DC Orders   ED Discharge Orders  Ordered    norethindrone (AYGESTIN) 5 MG tablet  Daily        01/13/23 1030             Note:  This document was prepared using Dragon voice recognition software and may include unintentional dictation errors.   Jackelyn Hoehn, PA-C 01/13/23 1351    Jene Every, MD 01/13/23 [phone]

## 2023-01-13 NOTE — ED Notes (Signed)
See triage notes. Patient c/o heavy vaginal bleeding. Patient was supposed to have a partial hysterectomy this past Friday but was unable to due to already being in the hospital.

## 2023-01-13 NOTE — Discharge Instructions (Addendum)
Please follow up with your OBGYN. Return for new, worsening, or change in symptoms or other concerns.

## 2023-01-13 NOTE — ED Triage Notes (Signed)
Pt arrives via POV. Pt reports heavy vaginal bleeding since yesterday. Pt reports abdominal cramping. She states she was scheduled to have a hysterectomy this past 25th but she was not able to make it. Pt is AxOx4.

## 2023-03-07 ENCOUNTER — Other Ambulatory Visit: Payer: Self-pay

## 2023-03-07 ENCOUNTER — Emergency Department
Admission: EM | Admit: 2023-03-07 | Discharge: 2023-03-07 | Disposition: A | Discharge status: Home / Self Care | Payer: MEDICAID | Attending: Emergency Medicine | Admitting: Emergency Medicine

## 2023-03-07 DIAGNOSIS — M542 Cervicalgia: Secondary | ICD-10-CM | POA: Diagnosis not present

## 2023-03-07 DIAGNOSIS — N390 Urinary tract infection, site not specified: Secondary | ICD-10-CM | POA: Diagnosis not present

## 2023-03-07 DIAGNOSIS — R059 Cough, unspecified: Secondary | ICD-10-CM | POA: Diagnosis present

## 2023-03-07 DIAGNOSIS — Z1152 Encounter for screening for COVID-19: Secondary | ICD-10-CM | POA: Insufficient documentation

## 2023-03-07 DIAGNOSIS — J040 Acute laryngitis: Secondary | ICD-10-CM | POA: Diagnosis not present

## 2023-03-07 LAB — RESP PANEL BY RT-PCR (RSV, FLU A&B, COVID)  RVPGX2
Influenza A by PCR: NEGATIVE
Influenza B by PCR: NEGATIVE
Resp Syncytial Virus by PCR: NEGATIVE
SARS Coronavirus 2 by RT PCR: NEGATIVE

## 2023-03-07 LAB — URINALYSIS, ROUTINE W REFLEX MICROSCOPIC
Bacteria, UA: NONE SEEN
Bilirubin Urine: NEGATIVE
Glucose, UA: NEGATIVE mg/dL
Hgb urine dipstick: NEGATIVE
Ketones, ur: NEGATIVE mg/dL
Nitrite: NEGATIVE
Protein, ur: NEGATIVE mg/dL
Specific Gravity, Urine: 1.019 (ref 1.005–1.030)
pH: 5 (ref 5.0–8.0)

## 2023-03-07 LAB — MONONUCLEOSIS SCREEN: Mono Screen: NEGATIVE

## 2023-03-07 MED ORDER — SODIUM CHLORIDE 0.9 % IV BOLUS
1000.0000 mL | Freq: Once | INTRAVENOUS | Status: AC
  Administered 2023-03-07: 1000 mL via INTRAVENOUS

## 2023-03-07 MED ORDER — CEFDINIR 300 MG PO CAPS: 300.0000 mg | ORAL_CAPSULE | Freq: Two times a day (BID) | ORAL | 0 refills | Status: DC

## 2023-03-07 NOTE — ED Triage Notes (Signed)
Pt presents to ER with c/o cough, generalized body aches, loss of appetite and general malaise that has been ongoing for last 3 days.  Pt reports her symptoms have not been getting any better.  Pt does have some voice hoarseness noted, but is otherwise A&O x4 and in NAD at this time.

## 2023-03-07 NOTE — Discharge Instructions (Addendum)
Call make an appointment with primary care provider if any continued problems.  Tylenol or ibuprofen as needed for throat pain, body aches or fever.  Increase fluids which is extremely important.  You may also eat popsicles which would count as a fluid along with Jell-O and chicken broth or beef broth.  Prescription for an antibiotic was sent to the pharmacy for you begin using.  As we already discussed you should have some Benadryl should you start itching.

## 2023-03-07 NOTE — ED Provider Notes (Signed)
[facility] Provider Note    Event Date/Time   First MD Initiated Contact with Patient 03/07/23 [phone]     (approximate)   History   Cough and Generalized Body Aches   HPI  Kristy Reid is a 27 y.o. female   presents to the ED with complaint of generalized bodyaches, cough, loss of appetite for the last 3 days.  Patient states that her child had influenza but has gotten better.  Patient also reports hoarseness and throat pain.  She has been drinking approximately 8 ounces of water every 2-1/2 days because of her throat pain.  She states last time she urinated was last evening.  Denies history of asthma, PTSD, MDD, bipolar 2 disorder, for any period      Physical Exam   Triage Vital Signs: ED Triage Vitals  Encounter Vitals Group     BP 03/07/23 0628 131/76     Systolic BP Percentile --      Diastolic BP Percentile --      Pulse Rate 03/07/23 0628 82     Resp 03/07/23 0628 18     Temp 03/07/23 0628 98.7 F (37.1 C)     Temp Source 03/07/23 0628 Oral     SpO2 03/07/23 0628 100 %     Weight 03/07/23 0630 145 lb 1 oz (65.8 kg)     Height 03/07/23 0630 5\' 2"  (1.575 m)     Head Circumference --      Peak Flow --      Pain Score 03/07/23 0629 8     Pain Loc --      Pain Education --      Exclude from Growth Chart --     Most recent vital signs: Vitals:   03/07/23 0820 03/07/23 1003  BP: 131/73 130/70  Pulse: 82 78  Resp:  18  Temp: 98.5 F (36.9 C)   SpO2: 100% 100%     General: Awake, no distress.  CV:  Good peripheral perfusion.  Heart regular rate and rhythm. Resp:  Normal effort.  Clear bilaterally. Abd:  No distention.  Other:  History of pharynx without erythema or exudate.  Uvula is midline.  Neck is supple with moderate tenderness cervical lymph nodes.   ED Results / Procedures / Treatments   Labs (all labs ordered are listed, but only abnormal results are displayed) Labs Reviewed  URINALYSIS, ROUTINE W REFLEX  MICROSCOPIC - Abnormal; Notable for the following components:      Result Value   Color, Urine YELLOW (*)    APPearance CLOUDY (*)    Leukocytes,Ua TRACE (*)    All other components within normal limits  RESP PANEL BY RT-PCR (RSV, FLU A&B, COVID)  RVPGX2  MONONUCLEOSIS SCREEN     PROCEDURES:  Critical Care performed:   Procedures   MEDICATIONS ORDERED IN ED: Medications  sodium chloride 0.9 % bolus 1,000 mL (0 mLs Intravenous Stopped 03/07/23 1003)     IMPRESSION / MDM / ASSESSMENT AND PLAN / ED COURSE  I reviewed the triage vital signs and the nursing notes.   Differential diagnosis includes, but is not limited to, viral pharyngitis, Mono, strep pharyngitis,  COVID, influenza, RSV, urinary tract infection, cystitis.  27 year old female presents to the ED with complaint of throat pain, body aches, loss of appetite for the last 3 days.  Patient also has some hoarseness and states this is the reason why she has been unable to drink water stating that she  has drank 8 ounces of water in the last 2-1/2 days.  In the emergency department patient is afebrile with an O2 sat of 100%.  Respiratory panel is negative, mono negative, urinalysis did show 21-50 WBCs.  Patient was given a liter of fluids while in the ED.  We discussed the necessity of her drinking more fluids and also she may take Tylenol or ibuprofen as needed for throat pain.  She was reassured.  A prescription for cefdinir was sent to the pharmacy.  Patient states that in the past she has had a rash with Rocephin but takes Benadryl with this.  She says the only antibiotic she cannot absolutely take is azithromycin which causes stomach upset.      Patient's presentation is most consistent with acute illness / injury with system symptoms.  FINAL CLINICAL IMPRESSION(S) / ED DIAGNOSES   Final diagnoses:  Urinary tract infection without hematuria, site unspecified  Laryngitis, acute     Rx / DC Orders   ED Discharge  Orders          Ordered    cefdinir (OMNICEF) 300 MG capsule  2 times daily        03/07/23 0949             Note:  This document was prepared using Dragon voice recognition software and may include unintentional dictation errors.   Tommi Rumps, PA-C 03/07/23 1102    Minna Antis, MD 03/07/23 [phone]

## 2023-03-15 ENCOUNTER — Emergency Department
Admission: EM | Admit: 2023-03-15 | Discharge: 2023-03-15 | Disposition: A | Discharge status: Home / Self Care | Payer: MEDICAID | Attending: Emergency Medicine | Admitting: Emergency Medicine

## 2023-03-15 ENCOUNTER — Emergency Department: Payer: MEDICAID

## 2023-03-15 ENCOUNTER — Other Ambulatory Visit: Payer: Self-pay

## 2023-03-15 ENCOUNTER — Encounter: Payer: Self-pay | Admitting: Radiology

## 2023-03-15 DIAGNOSIS — E876 Hypokalemia: Secondary | ICD-10-CM | POA: Insufficient documentation

## 2023-03-15 DIAGNOSIS — K529 Noninfective gastroenteritis and colitis, unspecified: Secondary | ICD-10-CM | POA: Insufficient documentation

## 2023-03-15 DIAGNOSIS — Z20822 Contact with and (suspected) exposure to covid-19: Secondary | ICD-10-CM | POA: Diagnosis not present

## 2023-03-15 DIAGNOSIS — R112 Nausea with vomiting, unspecified: Secondary | ICD-10-CM | POA: Diagnosis present

## 2023-03-15 LAB — COMPREHENSIVE METABOLIC PANEL
ALT: 20 U/L (ref 0–44)
AST: 23 U/L (ref 15–41)
Albumin: 4.1 g/dL (ref 3.5–5.0)
Alkaline Phosphatase: 81 U/L (ref 38–126)
Anion gap: 14 (ref 5–15)
BUN: 11 mg/dL (ref 6–20)
CO2: 22 mmol/L (ref 22–32)
Calcium: 9.1 mg/dL (ref 8.9–10.3)
Chloride: 102 mmol/L (ref 98–111)
Creatinine, Ser: 0.83 mg/dL (ref 0.44–1.00)
GFR, Estimated: >60 mL/min (ref >60)
Glucose, Bld: 92 mg/dL (ref 70–99)
Potassium: 3.3 mmol/L — ABNORMAL LOW (ref 3.5–5.1)
Sodium: 138 mmol/L (ref 135–145)
Total Bilirubin: 1 mg/dL (ref <1.2)
Total Protein: 7.9 g/dL (ref 6.5–8.1)

## 2023-03-15 LAB — URINALYSIS, ROUTINE W REFLEX MICROSCOPIC
Bilirubin Urine: NEGATIVE
Glucose, UA: NEGATIVE mg/dL
Ketones, ur: NEGATIVE mg/dL
Leukocytes,Ua: NEGATIVE
Nitrite: NEGATIVE
Protein, ur: NEGATIVE mg/dL
Specific Gravity, Urine: 1.029 (ref 1.005–1.030)
pH: 5 (ref 5.0–8.0)

## 2023-03-15 LAB — CBC
HCT: 45.6 % (ref 36.0–46.0)
Hemoglobin: 15.6 g/dL — ABNORMAL HIGH (ref 12.0–15.0)
MCH: 30.6 pg (ref 26.0–34.0)
MCHC: 34.2 g/dL (ref 30.0–36.0)
MCV: 89.6 fL (ref 80.0–100.0)
Platelets: 265 10*3/uL (ref 150–400)
RBC: 5.09 MIL/uL (ref 3.87–5.11)
RDW: 12 % (ref 11.5–15.5)
WBC: 4.3 10*3/uL (ref 4.0–10.5)
nRBC: 0 % (ref 0.0–0.2)

## 2023-03-15 LAB — PREGNANCY, URINE: Preg Test, Ur: NEGATIVE

## 2023-03-15 LAB — LIPASE, BLOOD: Lipase: 29 U/L (ref 11–51)

## 2023-03-15 LAB — HCG, QUANTITATIVE, PREGNANCY: hCG, Beta Chain, Quant, S: 1 m[IU]/mL (ref <5)

## 2023-03-15 LAB — RESP PANEL BY RT-PCR (RSV, FLU A&B, COVID)  RVPGX2
Influenza A by PCR: NEGATIVE
Influenza B by PCR: NEGATIVE
Resp Syncytial Virus by PCR: NEGATIVE
SARS Coronavirus 2 by RT PCR: NEGATIVE

## 2023-03-15 MED ORDER — SODIUM CHLORIDE 0.9 % IV BOLUS
1000.0000 mL | Freq: Once | INTRAVENOUS | Status: AC
Start: 2023-03-15 — End: 2023-03-15
  Administered 2023-03-15: 1000 mL via INTRAVENOUS

## 2023-03-15 MED ORDER — PROMETHAZINE HCL 12.5 MG RE SUPP: 12.5000 mg | Freq: Four times a day (QID) | RECTAL | 0 refills | Status: DC | PRN

## 2023-03-15 MED ORDER — PROMETHAZINE HCL 12.5 MG PO TABS: 12.5000 mg | ORAL_TABLET | Freq: Four times a day (QID) | ORAL | 0 refills | Status: DC | PRN

## 2023-03-15 MED ORDER — METOCLOPRAMIDE HCL 5 MG/ML IJ SOLN
10.0000 mg | Freq: Once | INTRAMUSCULAR | Status: AC
  Administered 2023-03-15: 10 mg via INTRAVENOUS
  Filled 2023-03-15: qty 2

## 2023-03-15 MED ORDER — IOHEXOL 300 MG/ML  SOLN
80.0000 mL | Freq: Once | INTRAMUSCULAR | Status: AC | PRN
  Administered 2023-03-15: 80 mL via INTRAVENOUS

## 2023-03-15 MED ORDER — KETOROLAC TROMETHAMINE 15 MG/ML IJ SOLN
15.0000 mg | Freq: Once | INTRAMUSCULAR | Status: AC
  Administered 2023-03-15: 15 mg via INTRAVENOUS
  Filled 2023-03-15: qty 1

## 2023-03-15 MED ORDER — SODIUM CHLORIDE 0.9 % IV SOLN
12.5000 mg | Freq: Four times a day (QID) | INTRAVENOUS | Status: DC | PRN
  Administered 2023-03-15: 12.5 mg via INTRAVENOUS
  Filled 2023-03-15: qty 12.5

## 2023-03-15 NOTE — ED Triage Notes (Signed)
PT in with co n.v.d since yesterday. STates not able to keep food or fluids down.

## 2023-03-15 NOTE — ED Provider Triage Note (Signed)
Emergency Medicine Provider Triage Evaluation Note  Kristy Reid , a 27 y.o. female  was evaluated in triage.  Pt complains of N,V,D started yesterday.  Review of Systems  Positive: N/v/d.  Cramping   LMP > 30 days Negative: No known fever  Physical Exam  BP 129/85 (BP Location: Left Arm)   Pulse (!) 102   Temp 98 F (36.7 C) (Oral)   Resp 20   Ht 5\' 2"  (1.575 m)   Wt 65.8 kg   LMP  (LMP Unknown)   SpO2 98%   BMI 26.52 kg/m  Gen:   Awake, no distress   Resp:  Normal effort   Lungs clear MSK:   Moves extremities without difficulty  Other:    Medical Decision Making  Medically screening exam initiated at 8:04 AM.  Appropriate orders placed.  Kristy Reid was informed that the remainder of the evaluation will be completed by another provider, this initial triage assessment does not replace that evaluation, and the importance of remaining in the ED until their evaluation is complete.     Tommi Rumps, PA-C 03/15/23 [phone]

## 2023-03-15 NOTE — ED Provider Notes (Signed)
[facility] Provider Note    Event Date/Time   First MD Initiated Contact with Patient 03/15/23 1201     (approximate)   History   Vomiting   HPI  Naydeli Lambert is a 27 y.o. female otherwise healthy comes in with nausea vomiting diarrhea that started yesterday.  Patient reports severe abdominal pain.  She reports that her menstruation is late and that she is not sexually active with men.  She denies any vaginal discharge.  She reports that normally when she has a viral illness she never vomits.  She reports this pain is worse than having a baby.  She is concerned that there is something more serious going on.   Physical Exam   Triage Vital Signs: ED Triage Vitals [03/15/23 0802]  Encounter Vitals Group     BP 129/85     Systolic BP Percentile      Diastolic BP Percentile      Pulse Rate (!) 102     Resp 20     Temp 98 F (36.7 C)     Temp Source Oral     SpO2 98 %     Weight 145 lb (65.8 kg)     Height 5\' 2"  (1.575 m)     Head Circumference      Peak Flow      Pain Score 10     Pain Loc      Pain Education      Exclude from Growth Chart     Most recent vital signs: Vitals:   03/15/23 0802  BP: 129/85  Pulse: (!) 102  Resp: 20  Temp: 98 F (36.7 C)  SpO2: 98%     General: Awake, no distress.  CV:  Good peripheral perfusion.  Resp:  Normal effort.  Abd:  Tender in the lower abdomen.  Other:     ED Results / Procedures / Treatments   Labs (all labs ordered are listed, but only abnormal results are displayed) Labs Reviewed  CBC - Abnormal; Notable for the following components:      Result Value   Hemoglobin 15.6 (*)    All other components within normal limits  COMPREHENSIVE METABOLIC PANEL - Abnormal; Notable for the following components:   Potassium 3.3 (*)    All other components within normal limits  URINALYSIS, ROUTINE W REFLEX MICROSCOPIC - Abnormal; Notable for the following components:   Color, Urine YELLOW (*)     APPearance CLOUDY (*)    Hgb urine dipstick MODERATE (*)    Bacteria, UA RARE (*)    All other components within normal limits  RESP PANEL BY RT-PCR (RSV, FLU A&B, COVID)  RVPGX2  LIPASE, BLOOD  PREGNANCY, URINE  HCG, QUANTITATIVE, PREGNANCY  POC URINE PREG, ED     RADIOLOGY I have reviewed the CT personally interpreted no evidence of acute pathology  PROCEDURES:  Critical Care performed: No  Procedures   MEDICATIONS ORDERED IN ED: Medications  promethazine (PHENERGAN) 12.5 mg in sodium chloride 0.9 % 50 mL IVPB (has no administration in time range)  sodium chloride 0.9 % bolus 1,000 mL (1,000 mLs Intravenous New Bag/Given 03/15/23 1220)  metoCLOPramide (REGLAN) injection 10 mg (10 mg Intravenous Given 03/15/23 1220)  ketorolac (TORADOL) 15 MG/ML injection 15 mg (15 mg Intravenous Given 03/15/23 1221)     IMPRESSION / MDM / ASSESSMENT AND PLAN / ED COURSE  I reviewed the triage vital signs and the nursing notes.   Patient's presentation  is most consistent with acute presentation with potential threat to life or bodily function.   Patient comes in with nausea vomiting diarrhea.  Discussed with patient that I suspect this is most likely gastroenteritis.  Patient is adamant that she would like a CT scan.  She states that she does not want to come back to the emergency room and that she never has vomiting and this is very abnormal for her.  Given she is tender on examination I did try to explain to patient that typically when you are having the cramping and diarrhea it can cause some pain however she again states that she has not been able to eat anything the diarrhea stopped and she is still having significant pain.  Patient was offered oral Zofran but she states that that never works for her and is requesting IV nausea medication.  Patient will be given some IV fluids IV nausea meds IV Toradol.  Pregnancy test was negative.  CBC shows normal white count CMP reassuring  slightly low potassium lipase normal urine without evidence of UTI  CT imaging reassuring  Patient given p.o. trial.  Patient will be discharged home.  Patient states that she cannot take Zofran and requesting Phenergan orally for home.  Will give a short course and have her follow-up outpatient with PCP  Initially prescribed oral pherngean but pt has ho of overdose with it- she states this when when she was in a lot of pain and at that time she was having mental health issues and didn't have SI but was trying to help the pain. D/w pt out of precaution will hold off on prescriving even if mental she is doing better today but will do rectal phenrgen instead.  Pt expressed understanding.  She has tolered PO.  Called pharmacy and delated oral meds and kept supossitory.     FINAL CLINICAL IMPRESSION(S) / ED DIAGNOSES   Final diagnoses:  Gastroenteritis     Rx / DC Orders   ED Discharge Orders          Ordered    promethazine (PHENERGAN) 12.5 MG tablet  Every 6 hours PRN,   Status:  Discontinued        03/15/23 1511    promethazine (PHENERGAN) 12.5 MG suppository  Every 6 hours PRN        03/15/23 1514             Note:  This document was prepared using Dragon voice recognition software and may include unintentional dictation errors.   Concha Se, MD 03/15/23 [phone]

## 2023-03-15 NOTE — Discharge Instructions (Addendum)
Your CT imaging was reassuring I suspect this is most likely a gastroenteritis or viral bug.  We have prescribed a medications to try to help with your symptoms you can return to the ER if develop worsening symptoms or any other concerns

## 2023-05-24 ENCOUNTER — Emergency Department
Admission: EM | Admit: 2023-05-24 | Discharge: 2023-05-24 | Disposition: A | Discharge status: Home / Self Care | Payer: MEDICAID | Attending: Emergency Medicine | Admitting: Emergency Medicine

## 2023-05-24 ENCOUNTER — Other Ambulatory Visit: Payer: Self-pay

## 2023-05-24 ENCOUNTER — Emergency Department: Payer: MEDICAID

## 2023-05-24 DIAGNOSIS — S0990XA Unspecified injury of head, initial encounter: Secondary | ICD-10-CM | POA: Diagnosis present

## 2023-05-24 DIAGNOSIS — H538 Other visual disturbances: Secondary | ICD-10-CM | POA: Diagnosis not present

## 2023-05-24 DIAGNOSIS — M25511 Pain in right shoulder: Secondary | ICD-10-CM | POA: Insufficient documentation

## 2023-05-24 MED ORDER — NAPROXEN 500 MG PO TABS
500.0000 mg | ORAL_TABLET | Freq: Once | ORAL | Status: AC
  Administered 2023-05-24: 500 mg via ORAL
  Filled 2023-05-24: qty 1
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
## 2023-06-17 ENCOUNTER — Emergency Department: Payer: MEDICAID

## 2023-06-17 ENCOUNTER — Encounter: Payer: Self-pay | Admitting: Emergency Medicine

## 2023-06-17 ENCOUNTER — Other Ambulatory Visit: Payer: Self-pay

## 2023-06-17 ENCOUNTER — Emergency Department
Admission: EM | Admit: 2023-06-17 | Discharge: 2023-06-17 | Disposition: A | Discharge status: Home / Self Care | Payer: MEDICAID | Attending: Emergency Medicine | Admitting: Emergency Medicine

## 2023-06-17 DIAGNOSIS — J45909 Unspecified asthma, uncomplicated: Secondary | ICD-10-CM | POA: Diagnosis not present

## 2023-06-17 DIAGNOSIS — J189 Pneumonia, unspecified organism: Secondary | ICD-10-CM | POA: Diagnosis not present

## 2023-06-17 DIAGNOSIS — Z87891 Personal history of nicotine dependence: Secondary | ICD-10-CM | POA: Insufficient documentation

## 2023-06-17 DIAGNOSIS — I10 Essential (primary) hypertension: Secondary | ICD-10-CM | POA: Diagnosis not present

## 2023-06-17 DIAGNOSIS — R059 Cough, unspecified: Secondary | ICD-10-CM | POA: Diagnosis present

## 2023-06-17 LAB — URINALYSIS, ROUTINE W REFLEX MICROSCOPIC
Bacteria, UA: NONE SEEN
Bilirubin Urine: NEGATIVE
Glucose, UA: NEGATIVE mg/dL
Ketones, ur: NEGATIVE mg/dL
Leukocytes,Ua: NEGATIVE
Nitrite: NEGATIVE
Protein, ur: NEGATIVE mg/dL
Specific Gravity, Urine: 1.012 (ref 1.005–1.030)
WBC, UA: 0 WBC/hpf (ref 0–5)
pH: 7 (ref 5.0–8.0)

## 2023-06-17 LAB — RESP PANEL BY RT-PCR (RSV, FLU A&B, COVID)  RVPGX2
Influenza A by PCR: NEGATIVE
Influenza B by PCR: NEGATIVE
Resp Syncytial Virus by PCR: NEGATIVE
SARS Coronavirus 2 by RT PCR: NEGATIVE

## 2023-06-17 LAB — POC URINE PREG, ED: Preg Test, Ur: NEGATIVE

## 2023-06-17 MED ORDER — GUAIFENESIN-CODEINE 100-10 MG/5ML PO SOLN: 5.0000 mL | Freq: Four times a day (QID) | ORAL | 0 refills | Status: DC | PRN

## 2023-06-17 MED ORDER — DOXYCYCLINE HYCLATE 100 MG PO CAPS: 100.0000 mg | ORAL_CAPSULE | Freq: Two times a day (BID) | ORAL | 0 refills | Status: DC

## 2023-06-17 NOTE — Discharge Instructions (Signed)
 Follow with your primary care if any continued problems or concerns.  Prescriptions were sent to your pharmacy.  Start the antibiotic today and take every day until completely finished.  Cough medication does contain a narcotic in it.  This medicine could cause drowsiness.  Do not drive or operate machinery while taking it.  Also take Tylenol or ibuprofen as needed for fever and bodyaches.  Return to the emergency department if any severe worsening of your symptoms.

## 2023-06-17 NOTE — ED Notes (Signed)
 See triage note  Presents with body aches and cough since yesterday  Subjective fever  Afebrile on arrival

## 2023-06-17 NOTE — ED Triage Notes (Signed)
 Pt here with SOB and a cough since yesterday. Pt also c/o bodyaches. Pt denies fever. Pt states cough is non productive.

## 2023-06-17 NOTE — ED Provider Notes (Signed)
 [facility] Provider Note    Event Date/Time   First MD Initiated Contact with Patient 06/17/23 [phone]     (approximate)   History   Shortness of Breath and Cough   HPI  Kristy Reid is a 28 y.o. female   presents to the ED with complaint of shortness of breath and cough since yesterday.  Patient also endorses body aches and a nonproductive cough.  Patient has not been able to rest due to her coughing.  She denies any fever, nausea, vomiting or diarrhea.  He is not aware of any sick exposures.  Patient has a history of asthma, bipolar 1 disorder, hypertension, pneumonia, PTSD, anxiety, former smoker, daily cannabis user.      Physical Exam   Triage Vital Signs: ED Triage Vitals [06/17/23 0841]  Encounter Vitals Group     BP (!) 136/91     Systolic BP Percentile      Diastolic BP Percentile      Pulse Rate (!) 118     Resp 17     Temp 98.8 F (37.1 C)     Temp Source Oral     SpO2 97 %     Weight 164 lb 14.5 oz (74.8 kg)     Height 5\' 2"  (1.575 m)     Head Circumference      Peak Flow      Pain Score 10     Pain Loc      Pain Education      Exclude from Growth Chart     Most recent vital signs: Vitals:   06/17/23 0841  BP: (!) 136/91  Pulse: (!) 118  Resp: 17  Temp: 98.8 F (37.1 C)  SpO2: 97%     General: Awake, no distress.  Able to talk in complete sentences without any shortness of breath. CV:  Good peripheral perfusion.  Heart regular rate and rhythm. Resp:  Normal effort.  Lungs are clear bilaterally but frequent coarse cough noted during exam. Abd:  No distention.  Other:     ED Results / Procedures / Treatments   Labs (all labs ordered are listed, but only abnormal results are displayed) Labs Reviewed  URINALYSIS, ROUTINE W REFLEX MICROSCOPIC - Abnormal; Notable for the following components:      Result Value   Color, Urine YELLOW (*)    APPearance CLEAR (*)    Hgb urine dipstick LARGE (*)    All other  components within normal limits  RESP PANEL BY RT-PCR (RSV, FLU A&B, COVID)  RVPGX2  POC URINE PREG, ED      RADIOLOGY Chest x-ray images were myself independent of the radiologist and no acute infiltrate was noted however this was during radiology downtime and I received a phone call from radiology stating that patient had right lower lobe pneumonia.  After patient discharged and radiology report was received sometime later official radiology report written says no acute cardiopulmonary findings.    PROCEDURES:  Critical Care performed:   Procedures   MEDICATIONS ORDERED IN ED: Medications - No data to display   IMPRESSION / MDM / ASSESSMENT AND PLAN / ED COURSE  I reviewed the triage vital signs and the nursing notes.   Differential diagnosis includes, but is not limited to, COVID, influenza, RSV, viral illness, pneumonia, bronchitis, seasonal allergies.  28 year old female presents to the ED with complaint of cough, congestion, body aches and shortness of breath that started yesterday.  Patient states that over-the-counter  medication is not helping.  Respiratory panel was reassuring with negative results, pregnancy test negative and urinalysis within normal limits.  Chest x-ray see comments under radiology.  Patient was discharged with a prescription for doxycycline 100 mg twice daily for 7 days.  A prescription for Robitussin AC was sent to the pharmacy to take as needed every 6 hours as needed for coughing.  She is strongly encouraged to follow-up with her PCP if any continued problems or return to the emergency department if any worsening of her symptoms.     Patient's presentation is most consistent with acute complicated illness / injury requiring diagnostic workup.  FINAL CLINICAL IMPRESSION(S) / ED DIAGNOSES   Final diagnoses:  Community acquired pneumonia, unspecified laterality     Rx / DC Orders   ED Discharge Orders          Ordered    doxycycline  (VIBRAMYCIN) 100 MG capsule  2 times daily        06/17/23 1053    guaiFENesin-codeine 100-10 MG/5ML syrup  Every 6 hours PRN        06/17/23 1053             Note:  This document was prepared using Dragon voice recognition software and may include unintentional dictation errors.   Tommi Rumps, PA-C 06/17/23 1230    Sharyn Creamer, MD 06/18/23 [phone]

## 2023-07-22 ENCOUNTER — Ambulatory Visit: Payer: MEDICAID | Admitting: Psychiatry

## 2023-07-31 ENCOUNTER — Emergency Department
Admission: EM | Admit: 2023-07-31 | Discharge: 2023-07-31 | Disposition: A | Discharge status: Home / Self Care | Payer: MEDICAID | Attending: Emergency Medicine | Admitting: Emergency Medicine

## 2023-07-31 ENCOUNTER — Emergency Department: Payer: MEDICAID

## 2023-07-31 DIAGNOSIS — M79671 Pain in right foot: Secondary | ICD-10-CM | POA: Diagnosis present

## 2023-07-31 DIAGNOSIS — W010XXA Fall on same level from slipping, tripping and stumbling without subsequent striking against object, initial encounter: Secondary | ICD-10-CM | POA: Diagnosis not present

## 2023-07-31 DIAGNOSIS — W19XXXA Unspecified fall, initial encounter: Secondary | ICD-10-CM

## 2023-07-31 DIAGNOSIS — Y92009 Unspecified place in unspecified non-institutional (private) residence as the place of occurrence of the external cause: Secondary | ICD-10-CM | POA: Insufficient documentation

## 2023-07-31 MED ORDER — KETOROLAC TROMETHAMINE 30 MG/ML IJ SOLN
30.0000 mg | Freq: Once | INTRAMUSCULAR | Status: AC
  Administered 2023-07-31: 30 mg via INTRAMUSCULAR
  Filled 2023-07-31: qty 1
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
## 2023-07-31 NOTE — Discharge Instructions (Addendum)
 You were evaluated in the ED following a fall.  Your x-ray of your ankle and right foot are normal.  You have been provided crutches and a walking boot and you are encouraged to bear weight as tolerated with the assistance of crutches.  Take ibuprofen  for pain as needed.  Get plenty of rest and apply ice to the affected area to help reduce swelling.  Elevate your foot on 2-3 pillows to induce optimal healing.  Follow up with your primary care provider.

## 2023-07-31 NOTE — ED Notes (Signed)
Pt d/c home per EDP order. Discharge summary reviewed, pt verbalizes understanding. NAD.

## 2023-07-31 NOTE — ED Triage Notes (Signed)
 Pt presents to the ED via POV from home for right ankle pain and HA. Pt states that her ankle was hurting when she woke up and she went outside to walk her dog, her ankle buckled, and she fell and hit her head on concrete. No LOC. No blood thinner. Pt ambulatory to triage room.

## 2023-07-31 NOTE — ED Notes (Signed)
 X-ray at bedside

## 2023-09-25 ENCOUNTER — Emergency Department
Admission: EM | Admit: 2023-09-25 | Discharge: 2023-09-25 | Disposition: A | Discharge status: Home / Self Care | Payer: MEDICAID | Attending: Emergency Medicine | Admitting: Emergency Medicine

## 2023-09-25 ENCOUNTER — Emergency Department: Payer: MEDICAID

## 2023-09-25 ENCOUNTER — Other Ambulatory Visit: Payer: Self-pay

## 2023-09-25 DIAGNOSIS — M25571 Pain in right ankle and joints of right foot: Secondary | ICD-10-CM | POA: Diagnosis not present

## 2023-09-25 DIAGNOSIS — R0602 Shortness of breath: Secondary | ICD-10-CM | POA: Insufficient documentation

## 2023-09-25 LAB — BASIC METABOLIC PANEL WITH GFR
Anion gap: 10 (ref 5–15)
BUN: 9 mg/dL (ref 6–20)
CO2: 22 mmol/L (ref 22–32)
Calcium: 9.4 mg/dL (ref 8.9–10.3)
Chloride: 108 mmol/L (ref 98–111)
Creatinine, Ser: 0.71 mg/dL (ref 0.44–1.00)
GFR, Estimated: >60 mL/min (ref >60)
Glucose, Bld: 91 mg/dL (ref 70–99)
Potassium: 3.2 mmol/L — ABNORMAL LOW (ref 3.5–5.1)
Sodium: 140 mmol/L (ref 135–145)

## 2023-09-25 LAB — CBC
HCT: 38.8 % (ref 36.0–46.0)
Hemoglobin: 12.8 g/dL (ref 12.0–15.0)
MCH: 29.9 pg (ref 26.0–34.0)
MCHC: 33 g/dL (ref 30.0–36.0)
MCV: 90.7 fL (ref 80.0–100.0)
Platelets: 197 K/uL (ref 150–400)
RBC: 4.28 MIL/uL (ref 3.87–5.11)
RDW: 13.5 % (ref 11.5–15.5)
WBC: 4.9 K/uL (ref 4.0–10.5)
nRBC: 0 % (ref 0.0–0.2)

## 2023-09-25 LAB — SAMPLE TO BLOOD BANK

## 2023-09-25 LAB — TROPONIN I (HIGH SENSITIVITY): Troponin I (High Sensitivity): <2 ng/L (ref <18)

## 2023-09-25 LAB — D-DIMER, QUANTITATIVE: D-Dimer, Quant: <0.27 ug{FEU}/mL (ref 0.00–0.50)

## 2023-09-25 NOTE — ED Notes (Signed)
 This tech had difficulty finding suitable vein for straight stick for d-dimer. Called lab for assistance, lab states they only have 1 phlebotomist. Ernest, RN notified.

## 2023-09-25 NOTE — Discharge Instructions (Signed)
 Chest x-ray shows no signs of pneumonia in your lungs.  Your markers for heart damage and signs of blood clots were negative here today.  No anemia.  Your potassium is slightly low but this should not be causing any other symptoms.  You can take over-the-counter vitamins to help with this.  X-ray of your ankle is also negative for any injuries to the bones.  You can use a compression wrap and ice as needed.  Otherwise follow-up with your primary care provider for ongoing management.

## 2023-09-25 NOTE — ED Triage Notes (Signed)
 Pt to ED via POV from home. Pt reports started with ankle pain and then developed SOB and centralized CP. 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

## 2023-09-25 NOTE — ED Provider Notes (Signed)
 [facility] Provider Note    Event Date/Time   First MD Initiated Contact with Patient 09/25/23 1324     (approximate)   History   Shortness of Breath   HPI Kristy Reid is a 28 y.o. female with history of MDD, borderline personality disorder, bipolar presenting today with multiple complaints.  Patient states originally starting out with right ankle pain starting several days ago with difficulty walking.  Denies any trauma.  No significant swelling seen and no redness.  She then started helping shortness of breath and chest pain symptoms.  Reports she has a history of blood clots as well as anemia requiring blood transfusions and was worried about this.  Otherwise denies cough, congestion, abdominal pain, nausea, vomiting.     Physical Exam   Triage Vital Signs: ED Triage Vitals [09/25/23 1227]  Encounter Vitals Group     BP 120/63     Girls Systolic BP Percentile      Girls Diastolic BP Percentile      Boys Systolic BP Percentile      Boys Diastolic BP Percentile      Pulse Rate 73     Resp 20     Temp 98.1 F (36.7 C)     Temp Source Oral     SpO2 100 %     Weight      Height      Head Circumference      Peak Flow      Pain Score 10     Pain Loc      Pain Education      Exclude from Growth Chart     Most recent vital signs: Vitals:   09/25/23 1227  BP: 120/63  Pulse: 73  Resp: 20  Temp: 98.1 F (36.7 C)  SpO2: 100%   I have reviewed the vital signs. General:  Awake, alert, no acute distress. Head:  Normocephalic, Atraumatic. EENT:  PERRL, EOMI, Oral mucosa pink and moist, Neck is supple. Cardiovascular: Regular rate, 2+ distal pulses. Respiratory:  Normal respiratory effort, symmetrical expansion, no distress.   Extremities: Mild tenderness to palpation when palpating the anterior and lateral right ankle but no deformity, edema, or erythema seen.  No tenderness palpation elsewise throughout bilateral upper or lower  extremities.  No swelling anywhere in the lower extremities as well as. Neuro:  Alert and oriented.  Interacting appropriately.   Skin:  Warm, dry, no rash.   Psych: Appropriate affect.    ED Results / Procedures / Treatments   Labs (all labs ordered are listed, but only abnormal results are displayed) Labs Reviewed  BASIC METABOLIC PANEL WITH GFR - Abnormal; Notable for the following components:      Result Value   Potassium 3.2 (*)    All other components within normal limits  CBC  D-DIMER, QUANTITATIVE  POC URINE PREG, ED  SAMPLE TO BLOOD BANK  TROPONIN I (HIGH SENSITIVITY)     EKG My EKG interpretation: Rate of 73, normal sinus rhythm, normal axis, normal intervals.  No acute ST elevations or depressions.   RADIOLOGY Independently interpreted chest x-ray and right ankle x-ray with no acute pathology   PROCEDURES:  Critical Care performed: No  Procedures   MEDICATIONS ORDERED IN ED: Medications - No data to display   IMPRESSION / MDM / ASSESSMENT AND PLAN / ED COURSE  I reviewed the triage vital signs and the nursing notes.  Differential diagnosis includes, but is not limited to, ankle sprain, lower concern for ankle fracture, low concern for DVT/PE/ACS  Patient's presentation is most consistent with acute complicated illness / injury requiring diagnostic workup.  Patient is a 28 year old female presenting today for multiple complaints including right ankle pain, shortness of breath, and chest pain.  When she is at rest she has no pain symptoms at all.  She appears tender to touch on the lateral and anterior sides of her right ankle but there is no deformity, swelling, or erythema.  Will get x-ray for further evaluation.  EKG largely unremarkable with no ischemic findings.  Chest x-ray negative.  CBC and BMP show no signs of anemia and only mild hypokalemia.  Her troponin is completely negative and no indication to repeat given  duration of symptoms.  Right ankle x-ray negative.  D-dimer negative.  Patient was reassessed and playing on her phone with no acute ongoing symptoms.  No life-threatening pathology here and patient otherwise stable for discharge.  Recommended compression and ice to her right ankle as needed for suspected sprain and told to follow-up with PCP along with strict return precautions.     FINAL CLINICAL IMPRESSION(S) / ED DIAGNOSES   Final diagnoses:  SOB (shortness of breath)  Acute right ankle pain     Rx / DC Orders   ED Discharge Orders     None        Note:  This document was prepared using Dragon voice recognition software and may include unintentional dictation errors.   Malvina Alm DASEN, MD 09/25/23 [phone]

## 2023-10-16 ENCOUNTER — Emergency Department
Admission: EM | Admit: 2023-10-16 | Discharge: 2023-10-16 | Disposition: A | Discharge status: Home / Self Care | Payer: MEDICAID

## 2023-10-16 ENCOUNTER — Emergency Department: Payer: MEDICAID

## 2023-10-16 ENCOUNTER — Other Ambulatory Visit: Payer: Self-pay

## 2023-10-16 DIAGNOSIS — R0789 Other chest pain: Secondary | ICD-10-CM | POA: Diagnosis present

## 2023-10-16 DIAGNOSIS — J45909 Unspecified asthma, uncomplicated: Secondary | ICD-10-CM | POA: Insufficient documentation

## 2023-10-16 DIAGNOSIS — R079 Chest pain, unspecified: Secondary | ICD-10-CM

## 2023-10-16 DIAGNOSIS — M79673 Pain in unspecified foot: Secondary | ICD-10-CM | POA: Insufficient documentation

## 2023-10-16 LAB — BASIC METABOLIC PANEL WITH GFR
Anion gap: 9 (ref 5–15)
BUN: 9 mg/dL (ref 6–20)
CO2: 22 mmol/L (ref 22–32)
Calcium: 9.5 mg/dL (ref 8.9–10.3)
Chloride: 108 mmol/L (ref 98–111)
Creatinine, Ser: 0.71 mg/dL (ref 0.44–1.00)
GFR, Estimated: >60 mL/min (ref >60)
Glucose, Bld: 91 mg/dL (ref 70–99)
Potassium: 3.7 mmol/L (ref 3.5–5.1)
Sodium: 139 mmol/L (ref 135–145)

## 2023-10-16 LAB — CBC
HCT: 39.2 % (ref 36.0–46.0)
Hemoglobin: 13.1 g/dL (ref 12.0–15.0)
MCH: 29.7 pg (ref 26.0–34.0)
MCHC: 33.4 g/dL (ref 30.0–36.0)
MCV: 88.9 fL (ref 80.0–100.0)
Platelets: 237 K/uL (ref 150–400)
RBC: 4.41 MIL/uL (ref 3.87–5.11)
RDW: 13.7 % (ref 11.5–15.5)
WBC: 4.8 K/uL (ref 4.0–10.5)
nRBC: 0 % (ref 0.0–0.2)

## 2023-10-16 LAB — RESP PANEL BY RT-PCR (RSV, FLU A&B, COVID)  RVPGX2
Influenza A by PCR: NEGATIVE
Influenza B by PCR: NEGATIVE
Resp Syncytial Virus by PCR: NEGATIVE
SARS Coronavirus 2 by RT PCR: NEGATIVE

## 2023-10-16 LAB — TROPONIN I (HIGH SENSITIVITY): Troponin I (High Sensitivity): <2 ng/L (ref <18)

## 2023-10-16 MED ORDER — HYDROXYZINE HCL 25 MG PO TABS
25.0000 mg | ORAL_TABLET | Freq: Once | ORAL | Status: AC
  Administered 2023-10-16: 25 mg via ORAL
  Filled 2023-10-16: qty 1

## 2023-10-16 MED ORDER — KETOROLAC TROMETHAMINE 15 MG/ML IJ SOLN
15.0000 mg | Freq: Once | INTRAMUSCULAR | Status: AC
  Administered 2023-10-16: 15 mg via INTRAMUSCULAR
  Filled 2023-10-16: qty 1

## 2023-10-16 MED ORDER — METHOCARBAMOL 500 MG PO TABS: 500.0000 mg | ORAL_TABLET | Freq: Four times a day (QID) | ORAL | 0 refills | Status: AC | PRN

## 2023-10-16 MED ORDER — ONDANSETRON 4 MG PO TBDP: 4.0000 mg | ORAL_TABLET | Freq: Three times a day (TID) | ORAL | 0 refills | Status: DC | PRN

## 2023-10-16 NOTE — ED Triage Notes (Signed)
 To ED for intermittent CP last 2 days. Also chills. Also R foot pain since 2 days, no injuries. Also has bruise on R leg, not sure what from. Pain is 10/10. Pt in no acute distress. Skin is dry. Respirations unlabored.

## 2023-10-16 NOTE — Discharge Instructions (Addendum)
 You were seen in the emergency department for 2 days of chest pain and muscle aches in your right foot.  Workup today was reassuring with no acute emergency identified.  This does not mean that nothing is wrong but whether you are safe to return home.  Please follow-up with your primary care physician this week.  Please rest and use adequate hydration.  Return with any acutely worsening symptoms.  Was very nice meeting you and I wish you the best of luck with everything -- RETURN PRECAUTIONS & AFTERCARE: (ENGLISH) RETURN PRECAUTIONS: Return immediately to the emergency department or see/call your doctor if you feel worse, weak or have changes in speech or vision, are short of breath, have fever, vomiting, pain, bleeding or dark stool, trouble urinating or any new issues. Return here or see/call your doctor if not improving as expected for your suspected condition. FOLLOW-UP CARE: Call your doctor and/or any doctors we referred you to for more advice and to make an appointment. Do this today, tomorrow or after the weekend. Some doctors only take PPO insurance so if you have HMO insurance you may want to contact your HMO or your regular doctor for referral to a specialist within your plan. Either way tell the doctor's office that it was a referral from the emergency department so you get the soonest possible appointment.  YOUR TEST RESULTS: Take result reports of any blood or urine tests, imaging tests and EKG's to your doctor and any referral doctor. Have any abnormal tests repeated. Your doctor or a referral doctor can let you know when this should be done. Also make sure your doctor contacts this hospital to get any test results that are not currently available such as cultures or special tests for infection and final imaging reports, which are often not available at the time you leave the ER but which may list additional important findings that are not documented on the preliminary report. BLOOD PRESSURE: If  your blood pressure was greater than 120/80 have your blood pressure rechecked within 1 to 2 weeks. MEDICATION SIDE EFFECTS: Do not drive, walk, bike, take the bus, etc. if you have received or are being prescribed any sedating medications such as those for pain or anxiety or certain antihistamines like Benadryl . If you have been give one of these here get a taxi home or have a friend drive you home. Ask your pharmacist to counsel you on potential side effects of any new medication

## 2023-10-16 NOTE — ED Provider Notes (Signed)
 [facility] Provider Note    Event Date/Time   First MD Initiated Contact with Patient 10/16/23 1118     (approximate)   History   Chest Pain   HPI  Lorianna Spadaccini is a 28 y.o. female with asthma, anemia, bilateral bunion surgery performed at outside hospital who presents with 2 days of intermittent chest pain nausea and myalgias.  Pain is constant, associated with a very slight cough.  Denies any fevers or chills but does have body aches especially in her right foot at the site of a previous bunion surgery.  Denies any erythema to the foot.  Denies any abdominal pain changes in urinary or bowel habits.  She presents with her cousin who helps contribute to the history      Physical Exam   Triage Vital Signs: ED Triage Vitals  Encounter Vitals Group     BP 10/16/23 1052 128/84     Girls Systolic BP Percentile --      Girls Diastolic BP Percentile --      Boys Systolic BP Percentile --      Boys Diastolic BP Percentile --      Pulse Rate 10/16/23 1052 77     Resp 10/16/23 1052 16     Temp 10/16/23 1052 97.9 F (36.6 C)     Temp Source 10/16/23 1052 Oral     SpO2 10/16/23 1052 100 %     Weight 10/16/23 1051 170 lb (77.1 kg)     Height 10/16/23 1051 5' 2 (1.575 m)     Head Circumference --      Peak Flow --      Pain Score 10/16/23 1049 10     Pain Loc --      Pain Education --      Exclude from Growth Chart --     Most recent vital signs: Vitals:   10/16/23 1052  BP: 128/84  Pulse: 77  Resp: 16  Temp: 97.9 F (36.6 C)  SpO2: 100%    Nursing Triage Note reviewed. Vital signs reviewed and patients oxygen saturation is normoxic  General: Patient is well nourished, well developed, awake and alert, resting comfortably in no acute distress Head: Normocephalic and atraumatic Eyes: Normal inspection, extraocular muscles intact, no conjunctival pallor Ear, nose, throat: Normal external exam Neck: Normal range of motion Respiratory:  Patient is in no respiratory distress, lungs CTAB Cardiovascular: Patient is not tachycardic, RRR without murmur appreciated GI: Abd SNT with no guarding or rebound  Back: Normal inspection of the back with good strength and range of motion throughout all ext Extremities: pulses intact with good cap refills, no LE pitting edema or calf tenderness No calf tenderness to palpation.  Patient's right lower extremity without erythema well-healed surgical scar Neuro: The patient is alert and oriented to person, place, and time, appropriately conversive, with 5/5 bilat UE/LE strength, no gross motor or sensory defects noted. Coordination appears to be adequate. Skin: Warm, dry, and intact Psych: normal mood and affect, no SI or HI  ED Results / Procedures / Treatments   Labs (all labs ordered are listed, but only abnormal results are displayed) Labs Reviewed  RESP PANEL BY RT-PCR (RSV, FLU A&B, COVID)  [id number]  BASIC METABOLIC PANEL WITH GFR  CBC  POC URINE PREG, ED  TROPONIN I (HIGH SENSITIVITY)  TROPONIN I (HIGH SENSITIVITY)     EKG EKG and rhythm strip are interpreted by myself:   EKG: [Normal sinus rhythm] at  heart rate of 88, normal QRS duration, QTc 447, normal ST segments and T waves no ectopy EKG not consistent with Acute STEMI Rhythm strip: NSR in lead II No inverted T waves, no evidence of right heart strain   RADIOLOGY XR chest: No acute abnormality on my independent review interpretation radiologist agrees XR right foot: No acute abnormality and well-positioned hardware    PROCEDURES:  Critical Care performed: No  Procedures   MEDICATIONS ORDERED IN ED: Medications  ketorolac  (TORADOL ) 15 MG/ML injection 15 mg (15 mg Intramuscular Given 10/16/23 1144)  hydrOXYzine  (ATARAX ) tablet 25 mg (25 mg Oral Given 10/16/23 1144)     IMPRESSION / MDM / ASSESSMENT AND PLAN / ED COURSE           HEART Score: 0                    Differential diagnosis includes, but is not  limited to: ACS, pneumonia, URI, electrolyte derangement, fracture, myocarditis  ED course: Patient is well-appearing and does not have any evidence of acute ischemia on her EKG.  Pulmonary exam is completely benign.  X-ray of the chest demonstrated no pneumonia or opacities.  Troponin is not elevated and her heart score is 0.  I did consider old pulmonary embolism however patient has no prior history of VTE and to hold PERC score is 0.  Labs demonstrated no acute abnormalities.  Patient felt improved after a dose of ketorolac .  Given her muscle spasms and aches we will send her with a prescription for methocarbamol  and Zofran .  She will hydrate and work note provided.  Counseled to follow-up with her primary care physician or return with acutely worsening symptoms   Clinical Course as of 10/16/23 1309  Sat Oct 16, 2023  1127 Basic metabolic panel No electrolyte derangements [HD]  1127 CBC No anemia [HD]  1127 Troponin I (High Sensitivity) Not elevated [HD]  1128 EKG 12-Lead [HD]  1129 DG Chest 2 View No acute abnormality [HD]  1238 Resp panel by RT-PCR (RSV, Flu A&B, Covid) Anterior Nasal Swab Negative [HD]  1238 DG Foot 2 Views Right Stable orthopedic hardware [HD]    Clinical Course User Index [HD] Nicholaus Rolland BRAVO, MD   At time of discharge there is no evidence of acute life, limb, vision, or fertility threat. Patient has stable vital signs, pain is well controlled, patient is ambulatory and p.o. tolerant.  Discharge instructions were completed using the [employer] system. I would refer you to those at this time. All warnings prescriptions follow-up etc. were discussed in detail with the patient. Patient indicates understanding and is agreeable with this plan. All questions answered.  Patient is made aware that they may return to the emergency department for any worsening or new condition or for any other emergency. --  Risk: 5 This patient has a high risk of morbidity due to further  diagnostic testing or treatment. Rationale: This patient's evaluation and management involve a high risk of morbidity due to the potential severity of presenting symptoms, need for diagnostic testing, and/or initiation of treatment that may require close monitoring. The differential includes conditions with potential for significant deterioration or requiring escalation of care. Treatment decisions in the ED, including medication administration, procedural interventions, or disposition planning, reflect this level of risk. Additional Support: -- Drug therapy requiring intensive monitoring for toxicity [ ]  -- Decision regarding elective major surgery with idenitified patient or procedure risk factors [ ]  -- Decision regarding hospitalization or escalation of  hospital-level care [ ]  -- Decision not to resuscitate or to de-escalate care because of poor prognosis [ ]  -- Parental controlled substances [ ]   COPA: 5 The patient has a severe exacerbation, progression, or side effect of treatment of the following illness/illnesses: []  OR  The patient has the following acute or chronic illness/injury that poses a possible threat to life or bodily function: [X] : The patient has a potentially serious acute condition or an acute exacerbation of a chronic illness requiring urgent evaluation and management in the Emergency Department. The clinical presentation necessitates immediate consideration of life-threatening or function-threatening diagnoses, even if they are ultimately ruled out.  Data(2/3 categories following were performed): 5 I reviewed or ordered at least three unique tests, external notes, and/or the history required an independent historian as one of the three requirements as following: CBC, BMP, troponin, cousin at bedside AND  I independently interpreted the following test: X-ray of chest OR  I discussed the management of the patient with the following external physician or qualified  healthcare provider: []     Suggested E/M Coding Level: 5, 99285, This has been selected based on the 11-14-2021 CPT guidelines for E/M codes in the Emergency Department based on 2/3 of the CoPA, Data, and Risk.   FINAL CLINICAL IMPRESSION(S) / ED DIAGNOSES   Final diagnoses:  Chest pain, unspecified type  Pain of foot, unspecified laterality     Rx / DC Orders   ED Discharge Orders          Ordered    ondansetron  (ZOFRAN -ODT) 4 MG disintegrating tablet  Every 8 hours PRN        10/16/23 1242    methocarbamol  (ROBAXIN ) 500 MG tablet  Every 6 hours PRN        10/16/23 1243             Note:  This document was prepared using Dragon voice recognition software and may include unintentional dictation errors.   Nicholaus Rolland BRAVO, MD 10/16/23 1311

## 2023-10-22 ENCOUNTER — Other Ambulatory Visit: Payer: Self-pay

## 2023-10-22 ENCOUNTER — Encounter: Payer: Self-pay | Admitting: Emergency Medicine

## 2023-10-22 DIAGNOSIS — K029 Dental caries, unspecified: Secondary | ICD-10-CM | POA: Insufficient documentation

## 2023-10-22 DIAGNOSIS — K0889 Other specified disorders of teeth and supporting structures: Secondary | ICD-10-CM | POA: Diagnosis present

## 2023-10-22 LAB — CBC
HCT: 37.8 % (ref 36.0–46.0)
Hemoglobin: 12.3 g/dL (ref 12.0–15.0)
MCH: 29.4 pg (ref 26.0–34.0)
MCHC: 32.5 g/dL (ref 30.0–36.0)
MCV: 90.2 fL (ref 80.0–100.0)
Platelets: 224 K/uL (ref 150–400)
RBC: 4.19 MIL/uL (ref 3.87–5.11)
RDW: 13.8 % (ref 11.5–15.5)
WBC: 5.8 K/uL (ref 4.0–10.5)
nRBC: 0 % (ref 0.0–0.2)

## 2023-10-22 NOTE — ED Triage Notes (Signed)
 Pt to ED via POV, states lower jaw pain, pt states possible infection after having teeth removed. Pt states pain since yesterday. Pt also c/o emesis since yesterday. Pt states has been unable to tolerate PO since yesterday, and has been unable to sleep due to pain. Pt states has tried multiple OTC meds, Tylenol , Ibuprofen , Benadryl  without success. Pt A&O x4, NAD noted in triage.

## 2023-10-23 ENCOUNTER — Emergency Department
Admission: EM | Admit: 2023-10-23 | Discharge: 2023-10-23 | Disposition: A | Discharge status: Home / Self Care | Payer: MEDICAID | Attending: Emergency Medicine | Admitting: Emergency Medicine

## 2023-10-23 ENCOUNTER — Other Ambulatory Visit: Payer: Self-pay

## 2023-10-23 DIAGNOSIS — K029 Dental caries, unspecified: Secondary | ICD-10-CM

## 2023-10-23 LAB — COMPREHENSIVE METABOLIC PANEL WITH GFR
ALT: 12 U/L (ref 0–44)
AST: 23 U/L (ref 15–41)
Albumin: 4.1 g/dL (ref 3.5–5.0)
Alkaline Phosphatase: 64 U/L (ref 38–126)
Anion gap: 13 (ref 5–15)
BUN: 8 mg/dL (ref 6–20)
CO2: 20 mmol/L — ABNORMAL LOW (ref 22–32)
Calcium: 9.3 mg/dL (ref 8.9–10.3)
Chloride: 105 mmol/L (ref 98–111)
Creatinine, Ser: 0.66 mg/dL (ref 0.44–1.00)
GFR, Estimated: >60 mL/min (ref >60)
Glucose, Bld: 110 mg/dL — ABNORMAL HIGH (ref 70–99)
Potassium: 3.6 mmol/L (ref 3.5–5.1)
Sodium: 138 mmol/L (ref 135–145)
Total Bilirubin: 0.8 mg/dL (ref 0.0–1.2)
Total Protein: 7 g/dL (ref 6.5–8.1)

## 2023-10-23 LAB — LIPASE, BLOOD: Lipase: 33 U/L (ref 11–51)

## 2023-10-23 MED ORDER — CLINDAMYCIN HCL 300 MG PO CAPS: 300.0000 mg | ORAL_CAPSULE | Freq: Three times a day (TID) | ORAL | 0 refills | Status: AC

## 2023-10-23 MED ORDER — ONDANSETRON 4 MG PO TBDP
4.0000 mg | ORAL_TABLET | Freq: Once | ORAL | Status: AC
  Administered 2023-10-23: 4 mg via ORAL
  Filled 2023-10-23: qty 1

## 2023-10-23 MED ORDER — HYDROCODONE-ACETAMINOPHEN 5-325 MG PO TABS: 2.0000 | ORAL_TABLET | Freq: Four times a day (QID) | ORAL | 0 refills | Status: DC | PRN

## 2023-10-23 MED ORDER — CLINDAMYCIN HCL 150 MG PO CAPS
300.0000 mg | ORAL_CAPSULE | Freq: Once | ORAL | Status: AC
  Administered 2023-10-23: 300 mg via ORAL
  Filled 2023-10-23: qty 2

## 2023-10-23 MED ORDER — ONDANSETRON 4 MG PO TBDP: ORAL_TABLET | ORAL | 0 refills | Status: AC

## 2023-10-23 MED ORDER — KETOROLAC TROMETHAMINE 30 MG/ML IJ SOLN
30.0000 mg | Freq: Once | INTRAMUSCULAR | Status: AC
  Administered 2023-10-23: 30 mg via INTRAMUSCULAR
  Filled 2023-10-23: qty 1

## 2023-10-23 MED ORDER — OXYCODONE-ACETAMINOPHEN 5-325 MG PO TABS
2.0000 | ORAL_TABLET | Freq: Once | ORAL | Status: AC
  Administered 2023-10-23: 2 via ORAL
  Filled 2023-10-23: qty 2

## 2023-10-23 NOTE — ED Provider Notes (Signed)
 [facility] Provider Note    Event Date/Time   First MD Initiated Contact with Patient 10/23/23 0033     (approximate)   History   Jaw Pain and Emesis   HPI Kristy Reid is a 28 y.o. female who presents for evaluation of pain in the right rear part of her jaw and teeth.  No known injury but the pain started yesterday.  There was a mention in the triage note about her having teeth removed but she said that is not true.  She said that the pain has been so bad that she has been throwing up and unable to tolerate any oral intake.  She tried over-the-counter medications including Tylenol  and ibuprofen  but it did not help.  No swelling and no difficulty swallowing.  She knows she has some problems with her teeth but has not yet been able to see a dentist.  She says that she has an appointment in 3 days but does not think she can make it that long without help.     Physical Exam   Triage Vital Signs: ED Triage Vitals  Encounter Vitals Group     BP 10/22/23 2333 128/86     Girls Systolic BP Percentile --      Girls Diastolic BP Percentile --      Boys Systolic BP Percentile --      Boys Diastolic BP Percentile --      Pulse Rate 10/22/23 2333 72     Resp 10/22/23 2333 19     Temp 10/22/23 2333 98.5 F (36.9 C)     Temp Source 10/22/23 2333 Oral     SpO2 10/22/23 2333 98 %     Weight 10/22/23 2332 77.1 kg (170 lb)     Height 10/22/23 2332 1.575 m (5' 2)     Head Circumference --      Peak Flow --      Pain Score 10/22/23 2332 10     Pain Loc --      Pain Education --      Exclude from Growth Chart --     Most recent vital signs: Vitals:   10/22/23 2333 10/23/23 0300  BP: 128/86   Pulse: 72 72  Resp: 19 19  Temp: 98.5 F (36.9 C)   SpO2: 98% 100%    General: Awake, no obvious distress, communicating with me and even laughing and making jokes. Mouth:  Patient has some chronic dental caries most notable in teeth 31 and 32, but no surrounding  swelling, no evidence of drainable abscesses, no evidence of severe odontogenic abscess or infection such as Ludwig's angina.  No trismus, no airway compromise.  Some tenderness to palpation of the teeth and the external right lower jaw. CV:  Good peripheral perfusion.  Resp:  Normal effort. Speaking easily and comfortably, no accessory muscle usage nor intercostal retractions.   Abd:  No distention.    ED Results / Procedures / Treatments   Labs (all labs ordered are listed, but only abnormal results are displayed) Labs Reviewed  COMPREHENSIVE METABOLIC PANEL WITH GFR - Abnormal; Notable for the following components:      Result Value   CO2 20 (*)    Glucose, Bld 110 (*)    All other components within normal limits  LIPASE, BLOOD  CBC      PROCEDURES:  Critical Care performed: No  Procedures    IMPRESSION / MDM / ASSESSMENT AND PLAN / ED COURSE  I reviewed the triage vital signs and the nursing notes.                              Differential diagnosis includes, but is not limited to, dental caries, dental infection, dental abscess, Ludwig's angina or other severe odontogenic infection  Patient's presentation is most consistent with exacerbation of chronic illness.  Labs/studies ordered: CMP, lipase, CBC  Interventions/Medications given:  Medications  oxyCODONE -acetaminophen  (PERCOCET/ROXICET) 5-325 MG per tablet 2 tablet (2 tablets Oral Given 10/23/23 0213)  ondansetron  (ZOFRAN -ODT) disintegrating tablet 4 mg (4 mg Oral Given 10/23/23 0216)  clindamycin  (CLEOCIN ) capsule 300 mg (300 mg Oral Given 10/23/23 0259)  ketorolac  (TORADOL ) 30 MG/ML injection 30 mg (30 mg Intramuscular Given 10/23/23 0258)    (Note:  hospital course my include additional interventions and/or labs/studies not listed above.)   Reassuring labs, no evidence of systemic infection.  Treating empirically for probable dental infection and stressed the need for the patient to follow-up with a dentist as  planned.  Medications as listed above and below.  No indication for need for admission and patient is comfortable with the plan         FINAL CLINICAL IMPRESSION(S) / ED DIAGNOSES   Final diagnoses:  Pain due to dental caries     Rx / DC Orders   ED Discharge Orders          Ordered    clindamycin  (CLEOCIN ) 300 MG capsule  3 times daily        10/23/23 0238    HYDROcodone -acetaminophen  (NORCO/VICODIN) 5-325 MG tablet  Every 6 hours PRN        10/23/23 0238    ondansetron  (ZOFRAN -ODT) 4 MG disintegrating tablet        10/23/23 0238             Note:  This document was prepared using Dragon voice recognition software and may include unintentional dictation errors.   Gordan Huxley, MD 10/23/23 [phone]

## 2023-10-23 NOTE — ED Notes (Signed)
 Pt discharged to ED circle at this time and left with all belongings. Pt ABCs intact. RR even and unlabored. Pt in NAD. Pt denies further needs from this RN.

## 2023-10-23 NOTE — Discharge Instructions (Addendum)
You have been seen in the Emergency Department (ED) today for dental pain.  Please take your prescribed antibiotic.  You may take pain medication as needed but ONLY as prescribed.  You should also take over-the-counter pain medication such as ibuprofen according to the label instructions unless a doctor has previously told you to avoid this type of medication (due to stomach ulcers, for example).  Alternatively you can take ibuprofen 600 mg by mouth three times daily with meals for no more than 5 days. ° °Take Norco as prescribed for severe pain. Do not drink alcohol, drive or participate in any other potentially dangerous activities while taking this medication as it may make you sleepy. Do not take this medication with any other sedating medications, either prescription or over-the-counter. If you were prescribed Percocet or Vicodin, do not take these with acetaminophen (Tylenol) as it is already contained within these medications. °  °This medication is an opiate (or narcotic) pain medication and can be habit forming.  Use it as little as possible to achieve adequate pain control.  Do not use or use it with extreme caution if you have a history of opiate abuse or dependence.  If you are on a pain contract with your primary care doctor or a pain specialist, be sure to let them know you were prescribed this medication today from the [facility].  This medication is intended for your use only - do not give any to anyone else and keep it in a secure place where nobody else, especially children, have access to it.  It will also cause or worsen constipation, so you may want to consider taking an over-the-counter stool softener while you are taking this medication. ° °Please see you dentist as soon as possible; only a dentist will be able to fix your problem(s).  Please see below for dental follow up options. ° °Return to the ED if you develop worsening pain, fever, pus/drainage, difficulty  breathing, or other symptoms that concern you. °

## 2023-10-23 NOTE — ED Notes (Addendum)
 Pt reporting to ED with R sided jaw pain. R sided CP with radiating pain down R arm, intermittent SOB, and some nausea/vomiting. Pt with hx of MI in 2020. Pt aox4. Connected to cardiac monitor. Pt VSS. Pt states that her symptoms have persisted all day and has not allwoed her to eat or rest. Pt ABCs intact. RR even and unlabored. Pt in NAD. Bed in lowest locked position. Call bell in reach. Denies needs at this time.   Past Medical History:  Diagnosis Date   Anemia    Anemia    Anxiety    Asthma    Bipolar 1 disorder (HCC)    Chronic back pain    COVID-19    Depression    DVT (deep venous thrombosis) (HCC)    left leg   Hypertension    Insomnia    Personality disorder (HCC)    boarderline   Pneumonia    PTSD (post-traumatic stress disorder)

## 2023-10-26 ENCOUNTER — Other Ambulatory Visit: Payer: Self-pay

## 2023-10-26 ENCOUNTER — Emergency Department: Payer: MEDICAID

## 2023-10-26 DIAGNOSIS — R059 Cough, unspecified: Secondary | ICD-10-CM | POA: Diagnosis not present

## 2023-10-26 DIAGNOSIS — R079 Chest pain, unspecified: Secondary | ICD-10-CM | POA: Diagnosis present

## 2023-10-26 DIAGNOSIS — R0981 Nasal congestion: Secondary | ICD-10-CM | POA: Insufficient documentation

## 2023-10-26 DIAGNOSIS — R112 Nausea with vomiting, unspecified: Secondary | ICD-10-CM | POA: Diagnosis not present

## 2023-10-26 DIAGNOSIS — Z5321 Procedure and treatment not carried out due to patient leaving prior to being seen by health care provider: Secondary | ICD-10-CM | POA: Diagnosis not present

## 2023-10-26 LAB — CBC
HCT: 38.5 % (ref 36.0–46.0)
Hemoglobin: 12.7 g/dL (ref 12.0–15.0)
MCH: 29.6 pg (ref 26.0–34.0)
MCHC: 33 g/dL (ref 30.0–36.0)
MCV: 89.7 fL (ref 80.0–100.0)
Platelets: 225 K/uL (ref 150–400)
RBC: 4.29 MIL/uL (ref 3.87–5.11)
RDW: 14.1 % (ref 11.5–15.5)
WBC: 5.7 K/uL (ref 4.0–10.5)
nRBC: 0 % (ref 0.0–0.2)

## 2023-10-26 LAB — HEPATIC FUNCTION PANEL
ALT: 14 U/L (ref 0–44)
AST: 24 U/L (ref 15–41)
Albumin: 4.1 g/dL (ref 3.5–5.0)
Alkaline Phosphatase: 58 U/L (ref 38–126)
Bilirubin, Direct: <0.1 mg/dL (ref 0.0–0.2)
Total Bilirubin: 0.6 mg/dL (ref 0.0–1.2)
Total Protein: 7.2 g/dL (ref 6.5–8.1)

## 2023-10-26 LAB — TROPONIN I (HIGH SENSITIVITY)
Troponin I (High Sensitivity): <2 ng/L (ref <18)
Troponin I (High Sensitivity): <2 ng/L (ref <18)

## 2023-10-26 LAB — BASIC METABOLIC PANEL WITH GFR
Anion gap: 11 (ref 5–15)
BUN: 8 mg/dL (ref 6–20)
CO2: 22 mmol/L (ref 22–32)
Calcium: 9.6 mg/dL (ref 8.9–10.3)
Chloride: 106 mmol/L (ref 98–111)
Creatinine, Ser: 0.91 mg/dL (ref 0.44–1.00)
GFR, Estimated: >60 mL/min (ref >60)
Glucose, Bld: 93 mg/dL (ref 70–99)
Potassium: 3.4 mmol/L — ABNORMAL LOW (ref 3.5–5.1)
Sodium: 139 mmol/L (ref 135–145)

## 2023-10-26 LAB — RESP PANEL BY RT-PCR (RSV, FLU A&B, COVID)  RVPGX2
Influenza A by PCR: NEGATIVE
Influenza B by PCR: NEGATIVE
Resp Syncytial Virus by PCR: NEGATIVE
SARS Coronavirus 2 by RT PCR: NEGATIVE

## 2023-10-26 LAB — LIPASE, BLOOD: Lipase: 26 U/L (ref 11–51)

## 2023-10-26 NOTE — ED Triage Notes (Signed)
 Pt to ED via EMS from work, pt reports onset of n/v chest pain earlier today, pt states a few days ago she began with cough and nasal congestion.

## 2023-10-26 NOTE — ED Triage Notes (Signed)
 Pt arrives via EMS from work for c/o CP accomp by N/V

## 2023-10-27 ENCOUNTER — Emergency Department
Admission: EM | Admit: 2023-10-27 | Discharge: 2023-10-27 | Discharge status: Against Medical Advice | Payer: MEDICAID | Attending: Emergency Medicine | Admitting: Emergency Medicine

## 2023-11-14 ENCOUNTER — Emergency Department (HOSPITAL_COMMUNITY): Payer: MEDICAID

## 2023-11-14 ENCOUNTER — Other Ambulatory Visit: Payer: Self-pay

## 2023-11-14 ENCOUNTER — Emergency Department (HOSPITAL_COMMUNITY)
Admission: EM | Admit: 2023-11-14 | Discharge: 2023-11-14 | Disposition: A | Discharge status: Home / Self Care | Payer: MEDICAID | Attending: Emergency Medicine | Admitting: Emergency Medicine

## 2023-11-14 DIAGNOSIS — J45909 Unspecified asthma, uncomplicated: Secondary | ICD-10-CM | POA: Diagnosis not present

## 2023-11-14 DIAGNOSIS — M546 Pain in thoracic spine: Secondary | ICD-10-CM | POA: Diagnosis not present

## 2023-11-14 DIAGNOSIS — M545 Low back pain, unspecified: Secondary | ICD-10-CM | POA: Diagnosis not present

## 2023-11-14 DIAGNOSIS — S0990XA Unspecified injury of head, initial encounter: Secondary | ICD-10-CM | POA: Diagnosis present

## 2023-11-14 DIAGNOSIS — W0110XA Fall on same level from slipping, tripping and stumbling with subsequent striking against unspecified object, initial encounter: Secondary | ICD-10-CM | POA: Insufficient documentation

## 2023-11-14 DIAGNOSIS — M542 Cervicalgia: Secondary | ICD-10-CM | POA: Diagnosis not present

## 2023-11-14 LAB — PREGNANCY, URINE: Preg Test, Ur: NEGATIVE

## 2023-11-14 MED ORDER — METHOCARBAMOL 500 MG PO TABS: 500.0000 mg | ORAL_TABLET | Freq: Two times a day (BID) | ORAL | 0 refills | Status: DC

## 2023-11-14 MED ORDER — ACETAMINOPHEN 325 MG PO TABS
650.0000 mg | ORAL_TABLET | Freq: Once | ORAL | Status: AC
  Administered 2023-11-14: 650 mg via ORAL
  Filled 2023-11-14: qty 2

## 2023-11-14 MED ORDER — ONDANSETRON 4 MG PO TBDP
4.0000 mg | ORAL_TABLET | Freq: Once | ORAL | Status: AC | PRN
  Administered 2023-11-14: 4 mg via ORAL
  Filled 2023-11-14: qty 1

## 2023-11-14 MED ORDER — NAPROXEN 500 MG PO TABS: 500.0000 mg | ORAL_TABLET | Freq: Two times a day (BID) | ORAL | 0 refills | Status: DC

## 2023-11-14 MED ORDER — IBUPROFEN 400 MG PO TABS
400.0000 mg | ORAL_TABLET | Freq: Once | ORAL | Status: AC | PRN
  Administered 2023-11-14: 400 mg via ORAL
  Filled 2023-11-14: qty 1
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
## 2023-11-14 NOTE — ED Notes (Signed)
Pt verbalized understanding of discharge instructions. Pt ambulated from ed with steady gait.  
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
## 2023-12-16 ENCOUNTER — Other Ambulatory Visit: Payer: Self-pay

## 2023-12-16 ENCOUNTER — Emergency Department
Admission: EM | Admit: 2023-12-16 | Discharge: 2023-12-16 | Disposition: A | Discharge status: Home / Self Care | Payer: MEDICAID | Attending: Emergency Medicine | Admitting: Emergency Medicine

## 2023-12-16 DIAGNOSIS — N939 Abnormal uterine and vaginal bleeding, unspecified: Secondary | ICD-10-CM | POA: Insufficient documentation

## 2023-12-16 LAB — CBC
HCT: 40.5 % (ref 36.0–46.0)
Hemoglobin: 13.3 g/dL (ref 12.0–15.0)
MCH: 29.8 pg (ref 26.0–34.0)
MCHC: 32.8 g/dL (ref 30.0–36.0)
MCV: 90.8 fL (ref 80.0–100.0)
Platelets: 263 K/uL (ref 150–400)
RBC: 4.46 MIL/uL (ref 3.87–5.11)
RDW: 14.4 % (ref 11.5–15.5)
WBC: 4.9 K/uL (ref 4.0–10.5)
nRBC: 0 % (ref 0.0–0.2)

## 2023-12-16 LAB — BASIC METABOLIC PANEL WITH GFR
Anion gap: 10 (ref 5–15)
BUN: 9 mg/dL (ref 6–20)
CO2: 24 mmol/L (ref 22–32)
Calcium: 9.6 mg/dL (ref 8.9–10.3)
Chloride: 104 mmol/L (ref 98–111)
Creatinine, Ser: 1.05 mg/dL — ABNORMAL HIGH (ref 0.44–1.00)
GFR, Estimated: >60 mL/min (ref >60)
Glucose, Bld: 106 mg/dL — ABNORMAL HIGH (ref 70–99)
Potassium: 3.7 mmol/L (ref 3.5–5.1)
Sodium: 138 mmol/L (ref 135–145)

## 2023-12-16 LAB — TYPE AND SCREEN
ABO/RH(D): O POS
Antibody Screen: NEGATIVE

## 2023-12-16 MED ORDER — MEDROXYPROGESTERONE ACETATE 10 MG PO TABS: 20.0000 mg | ORAL_TABLET | Freq: Three times a day (TID) | ORAL | 0 refills | Status: AC

## 2023-12-16 NOTE — ED Triage Notes (Signed)
 Pt to ED for heavy vaginal bleeding started this am. Reports going through 2 pads and super tampons every hour. Hx of same with blood transfusions.

## 2023-12-16 NOTE — ED Provider Notes (Signed)
 [facility] Provider Note    Event Date/Time   First MD Initiated Contact with Patient 12/16/23 1943     (approximate)   History   Vaginal Bleeding   HPI  Kristy Reid is a 28 y.o. female  who presents to the emergency department today because of concern for heavy vaginal bleeding. Patient states she has the history of same. Has required blood transfusions in the past. She says that the symptoms started today. She denies any fevers. No chest pain. No shortness of breath. Has been on birth control in the past but currently not on any. Had been scheduled for a hysterectomy with Ob/gyn but couldn't make the appointment due to hospitalization.     Physical Exam   Triage Vital Signs: ED Triage Vitals [12/16/23 1856]  Encounter Vitals Group     BP 111/62     Girls Systolic BP Percentile      Girls Diastolic BP Percentile      Boys Systolic BP Percentile      Boys Diastolic BP Percentile      Pulse Rate 82     Resp 18     Temp 98.2 F (36.8 C)     Temp src      SpO2 99 %     Weight 150 lb (68 kg)     Height 5' 2 (1.575 m)     Head Circumference      Peak Flow      Pain Score 5     Pain Loc      Pain Education      Exclude from Growth Chart     Most recent vital signs: Vitals:   12/16/23 1856 12/16/23 1948  BP: 111/62 116/60  Pulse: 82 81  Resp: 18 16  Temp: 98.2 F (36.8 C)   SpO2: 99% 100%   General: Awake, alert, oriented. CV:  Good peripheral perfusion. Regular rate and rhythm. Resp:  Normal effort. Lungs clear. Abd:  No distention. Tender to the lower abdomen.  ED Results / Procedures / Treatments   Labs (all labs ordered are listed, but only abnormal results are displayed) Labs Reviewed  BASIC METABOLIC PANEL WITH GFR - Abnormal; Notable for the following components:      Result Value   Glucose, Bld 106 (*)    Creatinine, Ser 1.05 (*)    All other components within normal limits  CBC  TYPE AND SCREEN      EKG  None   RADIOLOGY None   PROCEDURES:  Critical Care performed: No  MEDICATIONS ORDERED IN ED: Medications - No data to display   IMPRESSION / MDM / ASSESSMENT AND PLAN / ED COURSE  I reviewed the triage vital signs and the nursing notes.                              Differential diagnosis includes, but is not limited to, anemia, abnormal uterine bleeding  Patient's presentation is most consistent with acute presentation with potential threat to life or bodily function.   Patient scented to the emergency department today because of concerns for heavy vaginal bleeding.  Patient's blood work here without anemia.  Patient is neither hypotensive nor tachycardic.  I did have a discussion with the patient about control of bleeding.  We did discuss birth control.  Discussed blood clot risks.  At this time patient is willing to try.  Did discuss importance  of close follow-up with OB/GYN.     FINAL CLINICAL IMPRESSION(S) / ED DIAGNOSES   Final diagnoses:  Abnormal vaginal bleeding        Note:  This document was prepared using Dragon voice recognition software and may include unintentional dictation errors.    Floy Roberts, MD 12/16/23 2113

## 2023-12-24 ENCOUNTER — Emergency Department: Payer: MEDICAID

## 2023-12-24 ENCOUNTER — Other Ambulatory Visit: Payer: Self-pay

## 2023-12-24 ENCOUNTER — Emergency Department
Admission: EM | Admit: 2023-12-24 | Discharge: 2023-12-24 | Disposition: A | Discharge status: Home / Self Care | Payer: MEDICAID | Attending: Emergency Medicine | Admitting: Emergency Medicine

## 2023-12-24 DIAGNOSIS — M545 Low back pain, unspecified: Secondary | ICD-10-CM | POA: Diagnosis present

## 2023-12-24 DIAGNOSIS — R079 Chest pain, unspecified: Secondary | ICD-10-CM | POA: Insufficient documentation

## 2023-12-24 DIAGNOSIS — M5416 Radiculopathy, lumbar region: Secondary | ICD-10-CM

## 2023-12-24 LAB — D-DIMER, QUANTITATIVE: D-Dimer, Quant: <0.27 ug{FEU}/mL (ref 0.00–0.50)

## 2023-12-24 LAB — BASIC METABOLIC PANEL WITH GFR
Anion gap: 11 (ref 5–15)
BUN: 9 mg/dL (ref 6–20)
CO2: 23 mmol/L (ref 22–32)
Calcium: 9.1 mg/dL (ref 8.9–10.3)
Chloride: 108 mmol/L (ref 98–111)
Creatinine, Ser: 0.96 mg/dL (ref 0.44–1.00)
GFR, Estimated: >60 mL/min (ref >60)
Glucose, Bld: 67 mg/dL — ABNORMAL LOW (ref 70–99)
Potassium: 4 mmol/L (ref 3.5–5.1)
Sodium: 142 mmol/L (ref 135–145)

## 2023-12-24 LAB — CBC
HCT: 38 % (ref 36.0–46.0)
Hemoglobin: 12.3 g/dL (ref 12.0–15.0)
MCH: 29.7 pg (ref 26.0–34.0)
MCHC: 32.4 g/dL (ref 30.0–36.0)
MCV: 91.8 fL (ref 80.0–100.0)
Platelets: 235 K/uL (ref 150–400)
RBC: 4.14 MIL/uL (ref 3.87–5.11)
RDW: 14.4 % (ref 11.5–15.5)
WBC: 4.3 K/uL (ref 4.0–10.5)
nRBC: 0 % (ref 0.0–0.2)

## 2023-12-24 LAB — TROPONIN I (HIGH SENSITIVITY): Troponin I (High Sensitivity): <2 ng/L (ref <18)

## 2023-12-24 LAB — POC URINE PREG, ED: Preg Test, Ur: NEGATIVE

## 2023-12-24 MED ORDER — METHOCARBAMOL 500 MG PO TABS: 500.0000 mg | ORAL_TABLET | Freq: Four times a day (QID) | ORAL | 0 refills | Status: DC

## 2023-12-24 MED ORDER — IBUPROFEN 800 MG PO TABS: 800.0000 mg | ORAL_TABLET | Freq: Three times a day (TID) | ORAL | 0 refills | Status: DC | PRN

## 2023-12-24 MED ORDER — IBUPROFEN 800 MG PO TABS: 800.0000 mg | ORAL_TABLET | Freq: Three times a day (TID) | ORAL | 0 refills | Status: AC | PRN

## 2023-12-24 MED ORDER — METHOCARBAMOL 1000 MG/10ML IJ SOLN
500.0000 mg | Freq: Once | INTRAMUSCULAR | Status: AC
  Administered 2023-12-24: 500 mg via INTRAMUSCULAR
  Filled 2023-12-24: qty 5

## 2023-12-24 MED ORDER — KETOROLAC TROMETHAMINE 30 MG/ML IJ SOLN
30.0000 mg | Freq: Once | INTRAMUSCULAR | Status: AC
  Administered 2023-12-24: 30 mg via INTRAVENOUS
  Filled 2023-12-24: qty 1

## 2023-12-24 MED ORDER — PREDNISONE 10 MG (21) PO TBPK: ORAL_TABLET | ORAL | 0 refills | Status: DC

## 2023-12-24 MED ORDER — HYDROCODONE-ACETAMINOPHEN 5-325 MG PO TABS
1.0000 | ORAL_TABLET | Freq: Once | ORAL | Status: AC
  Administered 2023-12-24: 1 via ORAL
  Filled 2023-12-24: qty 1

## 2023-12-24 MED ORDER — DEXAMETHASONE SOD PHOSPHATE PF 10 MG/ML IJ SOLN
10.0000 mg | Freq: Once | INTRAMUSCULAR | Status: AC
  Administered 2023-12-24: 10 mg via INTRAVENOUS

## 2023-12-24 MED ORDER — METHOCARBAMOL 500 MG PO TABS: 500.0000 mg | ORAL_TABLET | Freq: Four times a day (QID) | ORAL | 0 refills | Status: AC

## 2023-12-24 NOTE — ED Notes (Signed)
 Pt reports episodes of incontinence last few days and trouble having bowel movements. PA [facility] notified of same.

## 2023-12-24 NOTE — ED Triage Notes (Signed)
 Pt to ED for back pain started yesterday, chest pain and emesis started this am. NAD Noted.

## 2023-12-24 NOTE — ED Notes (Signed)
 Assisted PA [facility] with rectal exam

## 2023-12-24 NOTE — ED Provider Notes (Signed)
-----------------------------------------   3:10 PM on 12/24/2023 -----------------------------------------  Blood pressure (!) 119/59, pulse 86, temperature 98.3 F (36.8 C), resp. rate 16, height 5' 2 (1.575 m), weight 68 kg, last menstrual period 12/16/2023, SpO2 100%, unknown if currently breastfeeding.  Assuming care from [facility], [place].  In short, Kristy Reid is a 28 y.o. female with a chief complaint of Chest Pain and Back Pain .  Refer to the original H&P for additional details.  The current plan of care is to await results of CT renal stone study with likely plan for discharge home.  CT renal stone study is negative for acute concerns.  Results discussed with the patient.  Plan will be to discharge home with a steroid pack and pain medication.  Work excuse for today and tomorrow provided as well.       [name], FNP 12/24/23 1537    Viviann Pastor, MD 12/24/23 [phone]

## 2023-12-24 NOTE — ED Provider Notes (Signed)
 [place] EMERGENCY DEPARTMENT AT [facility] Provider Note   CSN: [id number] Arrival date & time: 12/24/23  1121     Patient presents with: Chest Pain and Back Pain   Kristy Reid is a 28 y.o. female.   Patient here for body aches, chest and back pain.  Noting for the past few days she has had low back pain radiating to left leg.  Denying any injury.  No cough congestion fevers or chills.  She started developing left-sided chest pain earlier today.  Nothing seems to make it better or worse.   Chest Pain Associated symptoms: back pain   Associated symptoms: no abdominal pain, no cough, no fever, no headache, no nausea, no numbness, no shortness of breath, no vomiting and no weakness   Back Pain Associated symptoms: chest pain   Associated symptoms: no abdominal pain, no dysuria, no fever, no headaches, no numbness and no weakness        Prior to Admission medications   Medication Sig Start Date End Date Taking? Authorizing Provider  ibuprofen  (ADVIL ) 800 MG tablet Take 1 tablet (800 mg total) by mouth every 8 (eight) hours as needed. 12/24/23  Yes Hutchinson Isenberg, Alyce, PA-C  methocarbamol  (ROBAXIN ) 500 MG tablet Take 1 tablet (500 mg total) by mouth 4 (four) times daily for 7 days. 12/24/23 12/31/23 Yes Amarion Portell, Alyce, PA-C  predniSONE  (STERAPRED UNI-PAK 21 TAB) 10 MG (21) TBPK tablet Take as directed 12/24/23  Yes Jolie Strohecker, PA-C  albuterol  (VENTOLIN  HFA) 108 (90 Base) MCG/ACT inhaler Inhale 2 puffs into the lungs every 6 (six) hours as needed for wheezing or shortness of breath.    [provider]  escitalopram  (LEXAPRO ) 20 MG tablet Take 1 tablet (20 mg total) by mouth daily. 01/12/23   Izella Ismael NOVAK, MD  Ferrous Sulfate  (IRON ) 325 (65 Fe) MG TABS Take 1 tablet by mouth 3 (three) times daily. 11/03/22   [provider]  fluticasone  (FLONASE ) 50 MCG/ACT nasal spray Place 1 spray into both nostrils daily.    [provider]  fluticasone  (FLOVENT   HFA) 110 MCG/ACT inhaler Inhale 2 puffs into the lungs 2 (two) times daily.    [provider]  guaiFENesin -codeine  100-10 MG/5ML syrup Take 5 mLs by mouth every 6 (six) hours as needed for cough. 06/17/23   Saunders Shona CROME, PA-C  medroxyPROGESTERone  (DEPO-PROVERA ) 150 MG/ML injection Inject 150 mg into the muscle every 3 (three) months. 09/23/22   [provider]  medroxyPROGESTERone  (PROVERA ) 10 MG tablet Take 2 tablets (20 mg total) by mouth in the morning, at noon, and at bedtime for 7 days. 12/16/23 12/23/23  Floy Roberts, MD  metoprolol  tartrate (LOPRESSOR ) 25 MG tablet Take 25 mg by mouth 2 (two) times daily. 09/30/22   [provider]  nicotine  (NICODERM CQ  - DOSED IN MG/24 HOURS) 21 mg/24hr patch Place 1 patch (21 mg total) onto the skin daily. 01/12/23   Izella Ismael NOVAK, MD  norethindrone  (AYGESTIN ) 5 MG tablet Take 2 tablets (10 mg total) by mouth daily for 7 days. 01/13/23 01/20/23  Poggi, Jenna E, PA-C  ondansetron  (ZOFRAN -ODT) 4 MG disintegrating tablet Allow 1-2 tablets to dissolve in your mouth every 8 hours as needed for nausea/vomiting 10/23/23   Gordan Huxley, MD  QUEtiapine  (SEROQUEL ) 300 MG tablet Take 1 tablet (300 mg total) by mouth at bedtime. 01/11/23   Izella Ismael NOVAK, MD  QUEtiapine  (SEROQUEL ) 50 MG tablet Take 1 tablet (50 mg total) by mouth 2 (two) times daily.  01/11/23   Izella Ismael NOVAK, MD  traZODone  (DESYREL ) 50 MG tablet Take 1 tablet (50 mg total) by mouth at bedtime as needed for sleep. 01/11/23   Izella Ismael NOVAK, MD  loratadine  (CLARITIN ) 10 MG tablet Take 1 tablet (10 mg total) by mouth daily. 05/05/18 08/31/19  [name], Norleen DASEN, MD    Allergies: Penicillins, Rocephin [ceftriaxone], Zithromax  [azithromycin ], Lidocaine , and Other    Review of Systems  Constitutional:  Negative for chills and fever.  HENT:  Negative for congestion.   Respiratory:  Negative for cough and shortness of breath.   Cardiovascular:  Positive for chest pain.   Gastrointestinal:  Negative for abdominal pain, diarrhea, nausea and vomiting.  Genitourinary:  Negative for dysuria.  Musculoskeletal:  Positive for back pain.  Neurological:  Negative for weakness, numbness and headaches.    Updated Vital Signs BP (!) 119/59   Pulse 86   Temp 98.3 F (36.8 C)   Resp 16   Ht 5' 2 (1.575 m)   Wt 68 kg   LMP 12/16/2023   SpO2 100%   BMI 27.44 kg/m   Physical Exam Vitals reviewed.  Constitutional:      Appearance: Normal appearance.  HENT:     Head: Normocephalic and atraumatic.     Nose: Nose normal.  Cardiovascular:     Pulses: Normal pulses.  Pulmonary:     Effort: Pulmonary effort is normal.     Breath sounds: Normal breath sounds.  Musculoskeletal:     Cervical back: Normal range of motion.     Comments: Moderate lumbar tenderness mostly left paraspinal.  No midline tenderness.  Lower extremity strength and sensation equal in bilateral  Neurological:     Mental Status: She is alert and oriented to person, place, and time. Mental status is at baseline.  Psychiatric:        Mood and Affect: Mood normal.        Behavior: Behavior normal.     (all labs ordered are listed, but only abnormal results are displayed) Labs Reviewed  BASIC METABOLIC PANEL WITH GFR - Abnormal; Notable for the following components:      Result Value   Glucose, Bld 67 (*)    All other components within normal limits  POC URINE PREG, ED - Normal  CBC  D-DIMER, QUANTITATIVE  TROPONIN I (HIGH SENSITIVITY)    EKG: None  Radiology: MR LUMBAR SPINE WO CONTRAST Result Date: 12/24/2023 EXAM: MRI LUMBAR SPINE 12/24/2023 02:30:31 PM TECHNIQUE: Multiplanar multisequence MRI of the lumbar spine was performed without the administration of intravenous contrast. COMPARISON: CT lumbar spine 11/14/2023. CLINICAL HISTORY: Low back pain, cauda equina syndrome suspected. 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 The lowest fully formed disc space is labeled as S1-2. Normal vertebral body heights. Bone marrow signal is unremarkable. SPINAL CORD: The conus terminates at L1-2. SOFT TISSUES: No paraspinal mass. L1-L2: No significant disc herniation. No spinal canal stenosis or neural foraminal narrowing. L2-L3: No significant disc herniation. No spinal canal stenosis or neural foraminal narrowing. L3-L4: No significant disc herniation. No spinal canal stenosis or neural foraminal narrowing. L4-L5: No significant disc herniation. No spinal canal stenosis or neural foraminal narrowing. L5-S1: Small central disc protrusion without significant spinal canal stenosis or neural foraminal narrowing. IMPRESSION: 1. 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 2. No spinal canal stenosis or neural foraminal narrowing in the lumbar spine. 3. Small central disc protrusion  at L5-S1. Electronically signed by: Ryan Chess MD 12/24/2023 02:37 PM EDT RP Workstation: [id number]   DG Chest 2 View Result Date: 12/24/2023 EXAM: 2 VIEW(S) XRAY OF THE CHEST 12/24/2023 11:43:46 AM COMPARISON: 10/26/2023 CLINICAL HISTORY: Pt to ED for back pain started yesterday, chest pain and emesis started this am. NAD Noted. FINDINGS: LUNGS AND PLEURA: No focal pulmonary opacity. No pulmonary edema. No pleural effusion. No pneumothorax. HEART AND MEDIASTINUM: No acute abnormality of the cardiac and mediastinal silhouettes. BONES AND SOFT TISSUES: No acute osseous abnormality. IMPRESSION: 1. No acute cardiopulmonary process. Electronically signed by: Ryan Chess MD 12/24/2023 12:05 PM EDT RP Workstation: [id number]     Procedures   Medications Ordered in the ED  ketorolac  (TORADOL ) 30 MG/ML injection 30 mg (30 mg Intravenous Given 12/24/23 1230)  methocarbamol  (ROBAXIN ) injection 500 mg (500 mg Intramuscular Given 12/24/23 1349)  dexamethasone (DECADRON) injection 10 mg (10 mg  Intravenous Given 12/24/23 1354)                                    Medical Decision Making Patient here for low back pain with associated chest pain starting earlier today.  She has no red flag symptoms such as numbness or weakness no bowel or bladder dysfunction although she does have some left-sided paresthesias.  She is hemodynamically stable nonhypoxic.  ED staff noting that patient did report some incontinence.  I had previously asked her about bowel or bladder dysfunction which she denied however I guess she did not understand the question.  Given her severe low back pain her paresthesias her incontinence I am concerned for cauda equina will forego x-ray in favor of MRI.  Will check digital rectal exam and give muscle relaxer and steroid.  Rectal exam with ED tech Rosina MATSU at bedside reveals good rectal tone.  I discussed plan to get MRI and CT with attending Dr. Viviann who is in agreement.  Do have a overall moderately low suspicion of cauda equina syndrome.  End of shift signout to oncoming APP pending the CT and MRI.  Likely discharge home pending reassuring workup.  See handoff note for further information.        Amount and/or Complexity of Data Reviewed Labs: ordered. Radiology: ordered.  Risk Prescription drug management.        Final diagnoses:  Acute low back pain, unspecified back pain laterality, unspecified whether sciatica present  Chest pain, unspecified type    ED Discharge Orders          Ordered    methocarbamol  (ROBAXIN ) 500 MG tablet  4 times daily        12/24/23 1418    ibuprofen  (ADVIL ) 800 MG tablet  Every 8 hours PRN        12/24/23 1418    predniSONE  (STERAPRED UNI-PAK 21 TAB) 10 MG (21) TBPK tablet        12/24/23 1418    Ambulatory referral to Neurosurgery       Comments: Please Select To Department: [facility] for Nerve or Spine  Please select To Department: [facility] AT [place] for Cranial or Neurovascular    12/24/23 1418               Kingston Mallick, PA-C 12/24/23 1455    Viviann Pastor, MD 12/24/23 1934

## 2023-12-24 NOTE — Discharge Instructions (Signed)
 Follow-up with your primary care provider if your symptoms are not improving over the next few days.  If symptoms change or worsen and you are unable to see primary care, please return to the emergency department.

## 2023-12-27 NOTE — Progress Notes (Unsigned)
 Referring Physician:  Center, [facility] [address] [place],  [place] [address]  Primary Physician:  Center, [facility]  History of Present Illness: 12/30/2023 [name] has a history of asthma, depression, borderline personality disorder, PTSD, overdose, bipolar 2 disorder, GAD, schizoaffective disorder.   She has a history of chronic neck and shoulder pain since 2015.   Seen in ED on 12/24/23 for back and chest pain. Since 12/24/23 she has constant neck pain that radiates into her shoulders into both arm to her hands, left > right. She has numbness and tingling in both hands along with weakness. She is dropping things.   She also has pain down her mid back into her lower back. No pain in buttocks, but has posterior leg pain to her ankles and anterior leg pain from her knees to ankles. She has numbness, tingling, and weakness in both legs. She has weakness in both legs and tends to shuffle when she walks. Her legs shake when she walks. Her ankles feel heavy.  She was discharged with a dose pack, robaxin , and motrin . No relief with robaxin . Flexeril  helps more.   She is prescribed multiple psychiatric meds and is not taking them.   Tobacco use: Does not smoke.   Bowel/Bladder Dysfunction: she notes multiple episodes of urinary incontinence- no urge to go and then is voiding. She had one episode of bowel incontinence this week. No perineal numbness.   Conservative measures:  Physical therapy:  Has participated in with [facility] for her neck in January and this made her worse.  Multimodal medical therapy including regular antiinflammatories: ibuprofen , methocarbamol , prednisone  Injections: epidural steroid injections  Past Surgery: no spine or neck surgeries  Kristy Reid has balance issues, dexterity issues. She has fallen multiple times since last week.   The symptoms are causing a significant impact on the patient's life.    Review of Systems:  A 10 point review of systems is negative, except for the pertinent positives and negatives detailed in the HPI.  Past Medical History: Past Medical History:  Diagnosis Date   Allergy    Anemia    Anemia    Anxiety    Asthma    Bipolar 1 disorder (HCC)    Blood transfusion without reported diagnosis    Chronic back pain    COVID-19    Depression    DVT (deep venous thrombosis) (HCC)    left leg   Hypertension    Insomnia    Personality disorder (HCC)    boarderline   Pneumonia    PTSD (post-traumatic stress disorder)     Past Surgical History: Past Surgical History:  Procedure Laterality Date   BUNIONECTOMY Bilateral    feet   wrist sugery     fracture    Allergies: Allergies as of 12/30/2023 - Review Complete 12/24/2023  Allergen Reaction Noted   Nicotine  Dermatitis 06/19/2023   Penicillins Hives 08/09/2014   Rocephin [ceftriaxone] Hives 08/09/2014   Zithromax  [azithromycin ] Itching and Nausea And Vomiting 05/01/2016   Lidocaine  Hives, Itching, and Rash 12/26/2015   Other Rash 11/14/2023    Medications: Outpatient Encounter Medications as of 12/30/2023  Medication Sig   albuterol  (VENTOLIN  HFA) 108 (90 Base) MCG/ACT inhaler Inhale 2 puffs into the lungs every 6 (six) hours as needed for wheezing or shortness of breath.   fluticasone  (FLONASE ) 50 MCG/ACT nasal spray Place 1 spray into both nostrils daily.   fluticasone  (FLOVENT  HFA) 110 MCG/ACT inhaler Inhale 2 puffs  into the lungs 2 (two) times daily.   ibuprofen  (ADVIL ) 800 MG tablet Take 1 tablet (800 mg total) by mouth every 8 (eight) hours as needed.   medroxyPROGESTERone  (PROVERA ) 10 MG tablet Take 2 tablets (20 mg total) by mouth in the morning, at noon, and at bedtime for 7 days.   methocarbamol  (ROBAXIN ) 500 MG tablet Take 1 tablet (500 mg total) by mouth 4 (four) times daily for 7 days.   metoprolol  tartrate (LOPRESSOR ) 25 MG tablet Take 25 mg by mouth 2 (two) times daily.    ondansetron  (ZOFRAN -ODT) 4 MG disintegrating tablet Allow 1-2 tablets to dissolve in your mouth every 8 hours as needed for nausea/vomiting   predniSONE  (STERAPRED UNI-PAK 21 TAB) 10 MG (21) TBPK tablet Take as directed   [DISCONTINUED] escitalopram  (LEXAPRO ) 20 MG tablet Take 1 tablet (20 mg total) by mouth daily.   [DISCONTINUED] Ferrous Sulfate  (IRON ) 325 (65 Fe) MG TABS Take 1 tablet by mouth 3 (three) times daily.   [DISCONTINUED] guaiFENesin -codeine  100-10 MG/5ML syrup Take 5 mLs by mouth every 6 (six) hours as needed for cough.   [DISCONTINUED] loratadine  (CLARITIN ) 10 MG tablet Take 1 tablet (10 mg total) by mouth daily.   [DISCONTINUED] medroxyPROGESTERone  (DEPO-PROVERA ) 150 MG/ML injection Inject 150 mg into the muscle every 3 (three) months.   [DISCONTINUED] nicotine  (NICODERM CQ  - DOSED IN MG/24 HOURS) 21 mg/24hr patch Place 1 patch (21 mg total) onto the skin daily.   [DISCONTINUED] norethindrone  (AYGESTIN ) 5 MG tablet Take 2 tablets (10 mg total) by mouth daily for 7 days.   [DISCONTINUED] QUEtiapine  (SEROQUEL ) 300 MG tablet Take 1 tablet (300 mg total) by mouth at bedtime.   [DISCONTINUED] QUEtiapine  (SEROQUEL ) 50 MG tablet Take 1 tablet (50 mg total) by mouth 2 (two) times daily.   [DISCONTINUED] traZODone  (DESYREL ) 50 MG tablet Take 1 tablet (50 mg total) by mouth at bedtime as needed for sleep.   No facility-administered encounter medications on file as of 12/30/2023.    Social History: Social History   Tobacco Use   Smoking status: Former    Current packs/day: 0.00    Types: Cigarettes, E-cigarettes    Quit date: 08/13/2015    Years since quitting: 8.3   Smokeless tobacco: Never  Vaping Use   Vaping status: Some Days   Substances: Nicotine , Flavoring  Substance Use Topics   Alcohol use: Yes    Comment: occassionally   Drug use: Yes    Frequency: 7.0 times per week    Types: Marijuana    Family Medical History: Family History  Problem Relation Age of Onset    Diabetes Mother    Hypertension Mother    Mental illness Mother    ADD / ADHD Mother    Alcohol abuse Mother    Anxiety disorder Mother    Asthma Mother    Alcohol abuse Father    Diabetes Maternal Grandmother    Heart disease Maternal Grandmother    Arthritis Maternal Grandmother    Asthma Maternal Grandmother    COPD Maternal Grandmother    Asthma Brother    Cancer Neg Hx    Ovarian cancer Neg Hx     Physical Examination: Vitals:   12/30/23 0905  BP: 122/80    General: Patient is well developed, well nourished, calm, collected, and in no apparent distress. Attention to examination is appropriate.  Respiratory: Patient is breathing without any difficulty.   NEUROLOGICAL:     Awake, alert, oriented to person, place, and time.  Speech is clear and fluent. Fund of knowledge is appropriate.   Cranial Nerves: Pupils equal round and reactive to light.  Facial tone is symmetric.    Diffuse tenderness in posterior cervical, thoracic, and lumbar spine.   No abnormal lesions on exposed skin.   Strength: Side Biceps Triceps Deltoid Interossei Grip Wrist Ext. Wrist Flex.  R 5 5 4  4- 5 4 4   L 5 5 4  4- 4- 4 4   Side Iliopsoas Quads Hamstring PF DF EHL  R 5 5 5 5 4 4   L 5 5 5 5 4 4    She has pain with above strength testing.   Reflexes are 2+ and symmetric at the biceps, brachioradialis, patella and achilles, but 1+ at right achilles and patella.   Hoffman's is absent.  Clonus is not present.    Bilateral upper and lower extremity sensation is intact to light touch, but diminished in left lower extremity.   She is in [facility].   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 The lowest fully formed disc space is labeled as S1-2. Normal vertebral body heights. Bone marrow signal is unremarkable.   SPINAL CORD: The conus terminates  at L1-2.   SOFT TISSUES: No paraspinal mass.   L1-L2: No significant disc herniation. No spinal canal stenosis or neural foraminal narrowing.   L2-L3: No significant disc herniation. No spinal canal stenosis or neural foraminal narrowing.   L3-L4: No significant disc herniation. No spinal canal stenosis or neural foraminal narrowing.   L4-L5: No significant disc herniation. No spinal canal stenosis or neural foraminal narrowing.   L5-S1: Small central disc protrusion without significant spinal canal stenosis or neural foraminal narrowing.   IMPRESSION: 1. 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 2. No spinal canal stenosis or neural foraminal narrowing in the lumbar spine. 3. Small central disc protrusion at L5-S1.   Electronically signed by: Ryan Chess MD 12/24/2023 02:37 PM EDT RP Workstation: [id number]    I have personally reviewed the images and agree with the above interpretation.  Assessment and Plan: Ms. Gaumond has a history of chronic neck and shoulder pain since 2015.   Since 12/24/23 she has constant neck pain that radiates into her shoulders into both arm to her hands, left > right. She has numbness and tingling in both hands along with weakness. She is dropping things.   She also has pain down her mid back into her lower back. No pain in buttocks, but has posterior leg pain to her ankles and anterior leg pain from her knees to ankles. She has numbness, tingling, and weakness in both legs. She has weakness in both legs.   Lumbar MRI looks okay. No signs of cord compression.   She has weakness in both arms and feet on exam along with balance and dexterity issues. She notes 6 days of new urinary and bowel incontinence. She is having difficu  Treatment options discussed with patient and following plan made:   - Above symptoms concerning for possible cervical and/or thoracic compression.  - Recommend she to  go ED at [facility] to get cervical and thoracic MRI scan completed emergently.  - She is in agreement. Will follow up with her after the imaging.   I spent a total of 45 minutes in face-to-face and non-face-to-face activities related to this patient's care today including review of  outside records, review of imaging, review of symptoms, physical exam, discussion of differential diagnosis, discussion of treatment options, and documentation.   Thank you for involving me in the care of this patient.   Glade Boys PA-C Dept. of Neurosurgery

## 2023-12-30 ENCOUNTER — Encounter: Payer: Self-pay | Admitting: Emergency Medicine

## 2023-12-30 ENCOUNTER — Emergency Department: Payer: MEDICAID

## 2023-12-30 ENCOUNTER — Emergency Department
Admission: EM | Admit: 2023-12-30 | Discharge: 2023-12-30 | Disposition: A | Discharge status: Home / Self Care | Payer: MEDICAID | Source: Ambulatory Visit | Attending: Emergency Medicine | Admitting: Emergency Medicine

## 2023-12-30 ENCOUNTER — Encounter: Payer: Self-pay | Admitting: Orthopedic Surgery

## 2023-12-30 ENCOUNTER — Ambulatory Visit: Payer: MEDICAID | Admitting: Orthopedic Surgery

## 2023-12-30 VITALS — BP 122/80 | Ht 62.0 in | Wt 150.0 lb

## 2023-12-30 DIAGNOSIS — M549 Dorsalgia, unspecified: Secondary | ICD-10-CM | POA: Insufficient documentation

## 2023-12-30 DIAGNOSIS — R2689 Other abnormalities of gait and mobility: Secondary | ICD-10-CM

## 2023-12-30 DIAGNOSIS — M542 Cervicalgia: Secondary | ICD-10-CM | POA: Diagnosis not present

## 2023-12-30 DIAGNOSIS — R29898 Other symptoms and signs involving the musculoskeletal system: Secondary | ICD-10-CM | POA: Diagnosis not present

## 2023-12-30 DIAGNOSIS — R531 Weakness: Secondary | ICD-10-CM | POA: Diagnosis not present

## 2023-12-30 DIAGNOSIS — M5442 Lumbago with sciatica, left side: Secondary | ICD-10-CM

## 2023-12-30 DIAGNOSIS — M546 Pain in thoracic spine: Secondary | ICD-10-CM | POA: Diagnosis not present

## 2023-12-30 DIAGNOSIS — R202 Paresthesia of skin: Secondary | ICD-10-CM | POA: Insufficient documentation

## 2023-12-30 DIAGNOSIS — M5441 Lumbago with sciatica, right side: Secondary | ICD-10-CM

## 2023-12-30 DIAGNOSIS — M5412 Radiculopathy, cervical region: Secondary | ICD-10-CM

## 2023-12-30 LAB — CBC WITH DIFFERENTIAL/PLATELET
Abs Immature Granulocytes: 0.02 K/uL (ref 0.00–0.07)
Basophils Absolute: 0 K/uL (ref 0.0–0.1)
Basophils Relative: 0 %
Eosinophils Absolute: 0.1 K/uL (ref 0.0–0.5)
Eosinophils Relative: 1 %
HCT: 39.8 % (ref 36.0–46.0)
Hemoglobin: 12.9 g/dL (ref 12.0–15.0)
Immature Granulocytes: 0 %
Lymphocytes Relative: 45 %
Lymphs Abs: 2.8 K/uL (ref 0.7–4.0)
MCH: 29.4 pg (ref 26.0–34.0)
MCHC: 32.4 g/dL (ref 30.0–36.0)
MCV: 90.7 fL (ref 80.0–100.0)
Monocytes Absolute: 0.5 K/uL (ref 0.1–1.0)
Monocytes Relative: 7 %
Neutro Abs: 2.9 K/uL (ref 1.7–7.7)
Neutrophils Relative %: 47 %
Platelets: 269 K/uL (ref 150–400)
RBC: 4.39 MIL/uL (ref 3.87–5.11)
RDW: 14.2 % (ref 11.5–15.5)
WBC: 6.2 K/uL (ref 4.0–10.5)
nRBC: 0 % (ref 0.0–0.2)

## 2023-12-30 LAB — HCG, QUANTITATIVE, PREGNANCY: hCG, Beta Chain, Quant, S: <1 m[IU]/mL (ref <5)

## 2023-12-30 LAB — COMPREHENSIVE METABOLIC PANEL WITH GFR
ALT: 21 U/L (ref 0–44)
AST: 18 U/L (ref 15–41)
Albumin: 3.8 g/dL (ref 3.5–5.0)
Alkaline Phosphatase: 71 U/L (ref 38–126)
Anion gap: 10 (ref 5–15)
BUN: 11 mg/dL (ref 6–20)
CO2: 26 mmol/L (ref 22–32)
Calcium: 9.4 mg/dL (ref 8.9–10.3)
Chloride: 101 mmol/L (ref 98–111)
Creatinine, Ser: 0.94 mg/dL (ref 0.44–1.00)
GFR, Estimated: >60 mL/min (ref >60)
Glucose, Bld: 89 mg/dL (ref 70–99)
Potassium: 4.2 mmol/L (ref 3.5–5.1)
Sodium: 137 mmol/L (ref 135–145)
Total Bilirubin: 0.6 mg/dL (ref 0.0–1.2)
Total Protein: 7.5 g/dL (ref 6.5–8.1)

## 2023-12-30 MED ORDER — SENNOSIDES-DOCUSATE SODIUM 8.6-50 MG PO TABS: 2.0000 | ORAL_TABLET | Freq: Two times a day (BID) | ORAL | 0 refills | Status: AC

## 2023-12-30 MED ORDER — HYDROMORPHONE HCL 1 MG/ML IJ SOLN
0.5000 mg | Freq: Once | INTRAMUSCULAR | Status: AC
  Administered 2023-12-30: 0.5 mg via INTRAVENOUS
  Filled 2023-12-30: qty 0.5

## 2023-12-30 MED ORDER — OXYCODONE-ACETAMINOPHEN 5-325 MG PO TABS
1.0000 | ORAL_TABLET | Freq: Once | ORAL | Status: AC
  Administered 2023-12-30: 1 via ORAL
  Filled 2023-12-30: qty 1

## 2023-12-30 MED ORDER — ONDANSETRON HCL 4 MG/2ML IJ SOLN: 4.0000 mg | Freq: Once | INTRAMUSCULAR | Status: DC

## 2023-12-30 MED ORDER — OXYCODONE-ACETAMINOPHEN 5-325 MG PO TABS: 1.0000 | ORAL_TABLET | Freq: Four times a day (QID) | ORAL | 0 refills | Status: AC | PRN

## 2023-12-30 MED ORDER — ONDANSETRON HCL 4 MG/2ML IJ SOLN: 4.0000 mg | Freq: Once | INTRAMUSCULAR | Status: AC

## 2023-12-30 MED ORDER — NAPROXEN 500 MG PO TABS
500.0000 mg | ORAL_TABLET | Freq: Two times a day (BID) | ORAL | 0 refills | Status: DC
Start: 2023-12-30 — End: 2024-01-13

## 2023-12-30 MED ORDER — ONDANSETRON HCL 4 MG/2ML IJ SOLN
INTRAMUSCULAR | Status: AC
  Administered 2023-12-30: 4 mg via INTRAVENOUS
  Filled 2023-12-30: qty 2

## 2023-12-30 MED ORDER — PROMETHAZINE HCL 25 MG/ML IJ SOLN
12.5000 mg | Freq: Once | INTRAMUSCULAR | Status: AC
  Administered 2023-12-30: 12.5 mg via INTRAVENOUS
  Filled 2023-12-30: qty 12.5

## 2023-12-30 NOTE — ED Notes (Signed)
 Dr. Levander at bedside for evaluation at this time.

## 2023-12-30 NOTE — ED Notes (Signed)
 Pt reports she is still nauseous at this time. Dr. Levander notified. See new orders.

## 2023-12-30 NOTE — ED Notes (Signed)
 After administration of Dilaudid she started having some nausea. Dr. Levander notified at this time. See new orders.

## 2023-12-30 NOTE — Discharge Instructions (Addendum)
 The MRIs of your neck and upper back are both normal today.  Please continue your medications and follow-up with your doctor.

## 2023-12-30 NOTE — ED Triage Notes (Signed)
 Seen by neurosurgery and was referred to ED.  C/O continued pain to back, legs, and urinary incontinence. Seen Friday for same.

## 2023-12-30 NOTE — ED Provider Notes (Signed)
 Procedures     ----------------------------------------- 4:07 PM on 12/30/2023 -----------------------------------------   MRIs are normal.  Pain is well-controlled in the ED.  Will continue pain management, stable for discharge back to outpatient follow-up.   Viviann Pastor, MD 12/30/23 [phone]

## 2023-12-30 NOTE — ED Provider Notes (Signed)
 [facility] Provider Note    Event Date/Time   First MD Initiated Contact with Patient 12/30/23 1203     (approximate)   History   Weakness   HPI  Kristy Reid is a 28 year old female with history of MDD, bipolar disorder presenting to the emergency department for evaluation of back pain and weakness.  On 10/10, patient presented with chest and back pain.  CT renal stone study negative.  MRI lumbar spine without acute cord compression.  Had follow-up with neurosurgery today.  After her assessment, it was recommended that she present to the ER for further evaluation.  I reviewed documentation from her clinic visit today.  There, she reported neck pain radiating into both arms left greater than right with associated paresthesias and dropping things.  She is noted to have both arm and leg weakness on exam and also reported episodes of urinary incontinence.  She was directed to the ER to obtain cervical and thoracic MRI imaging given concern for possible cord compression.    Physical Exam   Triage Vital Signs: ED Triage Vitals  Encounter Vitals Group     BP 12/30/23 1049 113/86     Girls Systolic BP Percentile --      Girls Diastolic BP Percentile --      Boys Systolic BP Percentile --      Boys Diastolic BP Percentile --      Pulse Rate 12/30/23 1048 (!) 102     Resp 12/30/23 1048 16     Temp 12/30/23 1048 98.1 F (36.7 C)     Temp Source 12/30/23 1048 Oral     SpO2 12/30/23 1048 100 %     Weight --      Height --      Head Circumference --      Peak Flow --      Pain Score 12/30/23 1048 9     Pain Loc --      Pain Education --      Exclude from Growth Chart --     Most recent vital signs: Vitals:   12/30/23 1048 12/30/23 1049  BP:  113/86  Pulse: (!) 102   Resp: 16   Temp: 98.1 F (36.7 C)   SpO2: 100%      General: Awake, interactive  CV:  Good peripheral perfusion Resp:  Unlabored respirations Abd:  Nondistended, soft,  nontender Neuro:  Gross facial asymmetry, mild weakness of the bilateral upper extremities more notably with extension at the elbows, 5 out of 5 strength with flexion.  Intact but subjectively diminished sensation over the right arm and leg compared to the contralateral side.  Mild weakness with hip flexion bilaterally on my exam.     ED Results / Procedures / Treatments   Labs (all labs ordered are listed, but only abnormal results are displayed) Labs Reviewed  CBC WITH DIFFERENTIAL/PLATELET  COMPREHENSIVE METABOLIC PANEL WITH GFR  HCG, QUANTITATIVE, PREGNANCY     EKG EKG independently reviewed and interpreted by myself demonstrates:    RADIOLOGY Imaging independently reviewed and interpreted by myself demonstrates:   Formal Radiology Read:  No results found.  PROCEDURES:  Critical Care performed: No  Procedures   MEDICATIONS ORDERED IN ED: Medications  promethazine  (PHENERGAN ) 12.5 mg in sodium chloride  0.9 % 50 mL IVPB (12.5 mg Intravenous New Bag/Given 12/30/23 1502)  HYDROmorphone (DILAUDID) injection 0.5 mg (0.5 mg Intravenous Given 12/30/23 1255)  ondansetron  (ZOFRAN ) injection 4 mg (4 mg Intravenous Given  12/30/23 1303)     IMPRESSION / MDM / ASSESSMENT AND PLAN / ED COURSE  I reviewed the triage vital signs and the nursing notes.  Differential diagnosis includes, but is not limited to, acute cord compression, musculoskeletal strain, myelopathy  Patient's presentation is most consistent with acute presentation with potential threat to life or bodily function.  28 year old female presenting with weakness, sent in by neurosurgery for MRI evaluation.  Will obtain labs, MRI cervical and thoracic spine to further evaluate.  Ordered for pain control.  While here, patient did report nausea for which medications were ordered.  Signed out to oncoming physician at 1515 pending MRIs and disposition.      FINAL CLINICAL IMPRESSION(S) / ED DIAGNOSES   Final diagnoses:   Weakness  Acute back pain, unspecified back location, unspecified back pain laterality     Rx / DC Orders   ED Discharge Orders     None        Note:  This document was prepared using Dragon voice recognition software and may include unintentional dictation errors.   Levander Slate, MD 12/30/23 [phone]

## 2024-01-02 NOTE — Progress Notes (Unsigned)
 Telephone Visit- Progress Note: Referring Physician:  Center, [facility] [address] [place],  [place] [address]  Primary Physician:  [facility], [facility]  This visit was performed via telephone.  Patient location: home Provider location: office  I spent a total of 20 minutes non-face-to-face activities for this visit on the date of this encounter including review of current clinical condition and response to treatment.    Patient has given verbal consent to this telephone visits and we reviewed the limitations of a telephone visit. Patient wishes to proceed.    Chief Complaint:  follow up  History of Present Illness: Jett Kulzer is a 28 y.o. female has a history of  asthma, depression, borderline personality disorder, PTSD, overdose, bipolar 2 disorder, GAD, schizoaffective disorder.   She has a history of chronic neck and shoulder pain since 2015.   Last seen by me on 12/30/23 for constant neck and bilateral arm pain with weakness. Also with diffuse mid to lower back pain with bilateral leg pain with numbness, tingling, and weakness.   Lumbar MRI looked okay with no cord compression.   I sent her to ED for further workup. Cervical and thoracic MRI scan snowed no compression.   Phone visit scheduled for follow up.   She is about the same.   She continues with constant neck pain that radiates into her shoulders into both arm to her hands, left > right. She has numbness and tingling in both hands along with weakness.   She also has pain down her mid back into her lower back with  posterior leg pain to her ankles and anterior leg pain from her knees to ankles. She has numbness, tingling, and weakness in both legs. She has weakness in both legs and tends to shuffle when she walks.   She finished out the steroids and did not see much change. She has seen some improvement with naproxen  and flexeril . Feels like medications help with ankle pain  and feeling like weights on her ankles.   She is prescribed multiple psychiatric meds and is not taking them- she has been off of then for a few years.   Tobacco use: Does not smoke.   Bowel/Bladder Dysfunction: she notes multiple episodes of urinary incontinence- no urge to go and then is voiding. No further episodes of bowel incontinence this week. No perineal numbness.   Conservative measures:  Physical therapy:  Has participated in with [facility] for her neck in January and this made her worse.  Multimodal medical therapy including regular antiinflammatories: ibuprofen , methocarbamol , prednisone  Injections: epidural steroid injections  Past Surgery: no spine or neck surgeries  The symptoms are causing a significant impact on the patient's life.    Exam: No exam done as this was a telephone encounter.     Imaging: MRI cervical spine dated 12/30/23:  FINDINGS: MRI CERVICAL SPINE FINDINGS   Alignment: Mild gradual reversal of the usual cervical lordosis. No focal angulation or listhesis.   Vertebrae: No acute or suspicious osseous findings. No evidence of discitis or osteomyelitis.   Cord: Normal in signal and caliber.   Posterior Fossa, vertebral arteries, paraspinal tissues: Visualized portions of the posterior fossa appear unremarkable.Bilateral vertebral artery flow voids. No significant paraspinal findings. There are mildly prominent cervical lymph nodes bilaterally, measuring up to 1.0 cm short axis on the right and 1.2 cm on the left. Mild prominence of the lymphoid tissue in Waldeyer's ring.   Disc levels:   The cervical disc  heights are maintained. There is no evidence of disc herniation, spinal stenosis or significant foraminal narrowing.   MRI THORACIC SPINE FINDINGS   Alignment:  Physiologic.   Vertebrae: No acute or suspicious osseous findings. No evidence of discitis or osteomyelitis.   Cord: The thoracic cord appears normal in signal and  caliber.The conus medullaris extends to the T12-L1 disc space level.   Paraspinal and other soft tissues: No significant paraspinal abnormalities. No enlarged lymph nodes are identified within the visualized chest.   Disc levels:   The thoracic disc heights are maintained. No evidence of thoracic disc herniation, spinal stenosis or significant foraminal narrowing.   IMPRESSION: 1. Normal MRI's of the cervical and thoracic spine. No acute findings or explanation for the patient's symptoms. 2. No evidence of discitis or osteomyelitis. 3. Mildly prominent cervical lymph nodes bilaterally, likely reactive. Correlate clinically.     Electronically Signed   By: Elsie Perone M.D.   On: 12/30/2023 15:23    I have personally reviewed the images and agree with the above interpretation.  Assessment and Plan: Ms. Pulaski has a history of chronic neck and shoulder pain since 2015.    Since 12/24/23 she has constant neck pain that radiates into her shoulders into both arm to her hands, left > right. She has numbness and tingling in both hands along with weakness. She is dropping things.    She also has pain down her mid back into her lower back. No pain in buttocks, but has posterior leg pain to her ankles and anterior leg pain from her knees to ankles. She has numbness, tingling, and weakness in both legs. She has weakness in both legs.    Cervical, thoracic, and lumbar MRI look okay with no signs of compression.   Treatment options discussed with patient and following plan made:   - MRI of brain ordered for further evaluation of above symptoms and to evaluate possible chiari seen on cervical MRI.  - She will follow back up with her psychiatrist and therapist.  - She will discuss mildly prominent cervical lymph nodes bilaterally seen on cervical MRI with her PCP [facility]).  - Referral to neurology Anastacio) at [facility] for evaluation of above symptoms.  - Referral to  urology for urinary issues.   - Referral to pain management for medical management. Reviewed with [facility] Marcelino at [facility] and he is happy to see her.  - Note keeping her out of work from 12/24/23 for estimated 4-6 weeks. Can fill out paperwork as needed.  - Continue on prn naproxen  and flexeril  for now.  - Will plan follow up with me to review her brain MRI once I have the results.   Glade Boys PA-C Neurosurgery

## 2024-01-04 ENCOUNTER — Encounter: Payer: Self-pay | Admitting: Orthopedic Surgery

## 2024-01-04 ENCOUNTER — Ambulatory Visit: Payer: MEDICAID | Admitting: Orthopedic Surgery

## 2024-01-04 DIAGNOSIS — M546 Pain in thoracic spine: Secondary | ICD-10-CM

## 2024-01-04 DIAGNOSIS — M79604 Pain in right leg: Secondary | ICD-10-CM

## 2024-01-04 DIAGNOSIS — M542 Cervicalgia: Secondary | ICD-10-CM | POA: Diagnosis not present

## 2024-01-04 DIAGNOSIS — M5416 Radiculopathy, lumbar region: Secondary | ICD-10-CM | POA: Diagnosis not present

## 2024-01-04 DIAGNOSIS — R29898 Other symptoms and signs involving the musculoskeletal system: Secondary | ICD-10-CM | POA: Diagnosis not present

## 2024-01-04 DIAGNOSIS — M5412 Radiculopathy, cervical region: Secondary | ICD-10-CM

## 2024-01-04 DIAGNOSIS — M545 Low back pain, unspecified: Secondary | ICD-10-CM | POA: Diagnosis not present

## 2024-01-06 NOTE — Addendum Note (Signed)
 Addended by: HILMA HASTINGS on: 01/06/2024 08:48 AM   Modules accepted: Orders

## 2024-01-07 ENCOUNTER — Encounter: Payer: Self-pay | Admitting: Orthopedic Surgery

## 2024-01-11 ENCOUNTER — Inpatient Hospital Stay: Admission: RE | Admit: 2024-01-11 | Payer: MEDICAID | Source: Ambulatory Visit

## 2024-01-13 ENCOUNTER — Ambulatory Visit
Payer: MEDICAID | Attending: Student in an Organized Health Care Education/Training Program | Admitting: Student in an Organized Health Care Education/Training Program

## 2024-01-13 VITALS — BP 114/74 | HR 90 | Temp 97.5°F | Ht 62.0 in | Wt 158.0 lb

## 2024-01-13 DIAGNOSIS — M542 Cervicalgia: Secondary | ICD-10-CM | POA: Diagnosis present

## 2024-01-13 DIAGNOSIS — M7918 Myalgia, other site: Secondary | ICD-10-CM | POA: Insufficient documentation

## 2024-01-13 DIAGNOSIS — M545 Low back pain, unspecified: Secondary | ICD-10-CM | POA: Diagnosis present

## 2024-01-13 DIAGNOSIS — G8929 Other chronic pain: Secondary | ICD-10-CM | POA: Insufficient documentation

## 2024-01-13 DIAGNOSIS — G894 Chronic pain syndrome: Secondary | ICD-10-CM | POA: Insufficient documentation

## 2024-01-13 MED ORDER — CELECOXIB 100 MG PO CAPS: 100.0000 mg | ORAL_CAPSULE | Freq: Two times a day (BID) | ORAL | 0 refills | Status: AC
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
## 2024-01-13 NOTE — Progress Notes (Signed)
 Safety precautions to be maintained throughout the outpatient stay will include: orient to surroundings, keep bed in low position, maintain call bell within reach at all times, provide assistance with transfer out of bed and ambulation.

## 2024-01-23 ENCOUNTER — Emergency Department
Admission: EM | Admit: 2024-01-23 | Discharge: 2024-01-23 | Disposition: A | Discharge status: Home / Self Care | Payer: MEDICAID | Attending: Emergency Medicine | Admitting: Emergency Medicine

## 2024-01-23 ENCOUNTER — Other Ambulatory Visit: Payer: Self-pay

## 2024-01-23 DIAGNOSIS — J029 Acute pharyngitis, unspecified: Secondary | ICD-10-CM | POA: Diagnosis present

## 2024-01-23 DIAGNOSIS — R051 Acute cough: Secondary | ICD-10-CM | POA: Diagnosis not present

## 2024-01-23 DIAGNOSIS — N921 Excessive and frequent menstruation with irregular cycle: Secondary | ICD-10-CM | POA: Diagnosis not present

## 2024-01-23 DIAGNOSIS — J45909 Unspecified asthma, uncomplicated: Secondary | ICD-10-CM | POA: Diagnosis not present

## 2024-01-23 DIAGNOSIS — F1290 Cannabis use, unspecified, uncomplicated: Secondary | ICD-10-CM | POA: Diagnosis not present

## 2024-01-23 LAB — CBC
HCT: 38.5 % (ref 36.0–46.0)
Hemoglobin: 12.7 g/dL (ref 12.0–15.0)
MCH: 29.6 pg (ref 26.0–34.0)
MCHC: 33 g/dL (ref 30.0–36.0)
MCV: 89.7 fL (ref 80.0–100.0)
Platelets: 305 K/uL (ref 150–400)
RBC: 4.29 MIL/uL (ref 3.87–5.11)
RDW: 14.6 % (ref 11.5–15.5)
WBC: 5.7 K/uL (ref 4.0–10.5)
nRBC: 0 % (ref 0.0–0.2)

## 2024-01-23 LAB — RESP PANEL BY RT-PCR (RSV, FLU A&B, COVID)  RVPGX2
Influenza A by PCR: NEGATIVE
Influenza B by PCR: NEGATIVE
Resp Syncytial Virus by PCR: NEGATIVE
SARS Coronavirus 2 by RT PCR: NEGATIVE

## 2024-01-23 LAB — URINE DRUG SCREEN, QUALITATIVE (ARMC ONLY)
Amphetamines, Ur Screen: NOT DETECTED
Barbiturates, Ur Screen: NOT DETECTED
Benzodiazepine, Ur Scrn: NOT DETECTED
Cannabinoid 50 Ng, Ur ~~LOC~~: POSITIVE — AB
Cocaine Metabolite,Ur ~~LOC~~: NOT DETECTED
MDMA (Ecstasy)Ur Screen: NOT DETECTED
Methadone Scn, Ur: NOT DETECTED
Opiate, Ur Screen: NOT DETECTED
Phencyclidine (PCP) Ur S: NOT DETECTED
Tricyclic, Ur Screen: NOT DETECTED

## 2024-01-23 LAB — URINALYSIS, ROUTINE W REFLEX MICROSCOPIC
Bacteria, UA: NONE SEEN
Bilirubin Urine: NEGATIVE
Glucose, UA: NEGATIVE mg/dL
Ketones, ur: 20 mg/dL — AB
Leukocytes,Ua: NEGATIVE
Nitrite: NEGATIVE
Protein, ur: NEGATIVE mg/dL
Specific Gravity, Urine: 1.025 (ref 1.005–1.030)
pH: 5 (ref 5.0–8.0)

## 2024-01-23 LAB — COMPREHENSIVE METABOLIC PANEL WITH GFR
ALT: 10 U/L (ref 0–44)
AST: 16 U/L (ref 15–41)
Albumin: 3.6 g/dL (ref 3.5–5.0)
Alkaline Phosphatase: 78 U/L (ref 38–126)
Anion gap: 10 (ref 5–15)
BUN: 5 mg/dL — ABNORMAL LOW (ref 6–20)
CO2: 21 mmol/L — ABNORMAL LOW (ref 22–32)
Calcium: 9.2 mg/dL (ref 8.9–10.3)
Chloride: 107 mmol/L (ref 98–111)
Creatinine, Ser: 0.84 mg/dL (ref 0.44–1.00)
GFR, Estimated: >60 mL/min (ref >60)
Glucose, Bld: 95 mg/dL (ref 70–99)
Potassium: 3.5 mmol/L (ref 3.5–5.1)
Sodium: 138 mmol/L (ref 135–145)
Total Bilirubin: 0.6 mg/dL (ref 0.0–1.2)
Total Protein: 8 g/dL (ref 6.5–8.1)

## 2024-01-23 LAB — GROUP A STREP BY PCR: Group A Strep by PCR: NOT DETECTED

## 2024-01-23 LAB — POC URINE PREG, ED: Preg Test, Ur: NEGATIVE

## 2024-01-23 LAB — TSH: TSH: 1.348 u[IU]/mL (ref 0.350–4.500)

## 2024-01-23 LAB — LIPASE, BLOOD: Lipase: 28 U/L (ref 11–51)

## 2024-01-23 LAB — ETHANOL: Alcohol, Ethyl (B): <15 mg/dL (ref <15)

## 2024-01-23 MED ORDER — DOXYCYCLINE HYCLATE 100 MG PO CAPS: 100.0000 mg | ORAL_CAPSULE | Freq: Two times a day (BID) | ORAL | 0 refills | Status: AC

## 2024-01-23 MED ORDER — KETOROLAC TROMETHAMINE 30 MG/ML IJ SOLN
30.0000 mg | Freq: Once | INTRAMUSCULAR | Status: AC
  Administered 2024-01-23: 30 mg via INTRAMUSCULAR
  Filled 2024-01-23: qty 1

## 2024-01-23 MED ORDER — ONDANSETRON HCL 4 MG PO TABS: 4.0000 mg | ORAL_TABLET | Freq: Three times a day (TID) | ORAL | Status: DC | PRN

## 2024-01-23 MED ORDER — KETOROLAC TROMETHAMINE 15 MG/ML IJ SOLN
15.0000 mg | Freq: Once | INTRAMUSCULAR | Status: DC
  Filled 2024-01-23: qty 1

## 2024-01-23 MED ORDER — IBUPROFEN 600 MG PO TABS: 600.0000 mg | ORAL_TABLET | Freq: Three times a day (TID) | ORAL | Status: DC | PRN

## 2024-01-23 MED ORDER — ALUM & MAG HYDROXIDE-SIMETH 200-200-20 MG/5ML PO SUSP: 30.0000 mL | Freq: Four times a day (QID) | ORAL | Status: DC | PRN

## 2024-01-23 MED ORDER — MENTHOL 3 MG MT LOZG
1.0000 | LOZENGE | OROMUCOSAL | Status: DC | PRN
  Filled 2024-01-23: qty 9

## 2024-01-23 NOTE — ED Provider Notes (Signed)
 Procedures     ----------------------------------------- 8:03 PM on 01/23/2024 -----------------------------------------   No longer wishes to wait for psychiatry recommendations.  She is medically stable, not committable, has medical decision-making capacity and stable for discharge.   Viviann Pastor, MD 01/23/24 2003

## 2024-01-23 NOTE — ED Provider Notes (Signed)
 [facility] Provider Note    Event Date/Time   First MD Initiated Contact with Patient 01/23/24 1106     (approximate)   History   Sore Throat and Vaginal Bleeding   HPI  Kristy Reid is a 28 y.o. female with history of asthma, PTSD, MDD, bipolar, anxiety, schizophrenia, chronic pain and as listed in EMR presents to the emergency department for treatment and evaluation of cough and sore throat for 2 weeks that seems to be getting worse. No fever, nausea, vomiting, or diarrhea.  Also experiencing heavy vaginal bleeding and lower abdominal pain for the past 2-1/2 weeks. She is scheduled for a hysterectomy next month.  Additionally, she wants to speak with a psychologist/psychiatrist. She has been off of her medications and is feeling fed up with life. She isn't sure if she is suicidal. She does not have a plan to harm herself or anyone else.   Physical Exam    Vitals:   01/23/24 1309 01/23/24 1311  BP: (!) 149/101   Pulse: 89   Resp: 18   Temp:  98.6 F (37 C)  SpO2: 100%     General: Awake, no distress.  CV:  Good peripheral perfusion.  Resp:  Normal effort.  Abd:  No distention. Soft. Other:  Tonsils enlarged and erythematous without exudate. Uvula is midline.   ED Results / Procedures / Treatments   Labs (all labs ordered are listed, but only abnormal results are displayed)  Labs Reviewed  COMPREHENSIVE METABOLIC PANEL WITH GFR - Abnormal; Notable for the following components:      Result Value   CO2 21 (*)    BUN 5 (*)    All other components within normal limits  URINALYSIS, ROUTINE W REFLEX MICROSCOPIC - Abnormal; Notable for the following components:   Color, Urine YELLOW (*)    APPearance HAZY (*)    Hgb urine dipstick MODERATE (*)    Ketones, ur 20 (*)    All other components within normal limits  URINE DRUG SCREEN, QUALITATIVE ([facility] ONLY) - Abnormal; Notable for the following components:   Cannabinoid 50 Ng, Ur Campbell  POSITIVE (*)    All other components within normal limits  GROUP A STREP BY PCR  RESP PANEL BY RT-PCR (RSV, FLU A&B, COVID)  RVPGX2  LIPASE, BLOOD  CBC  ETHANOL  TSH  POC URINE PREG, ED     EKG  Not indicated.   RADIOLOGY  Image and radiology report reviewed and interpreted by me. Radiology report consistent with the same.  Not indicated.  PROCEDURES:  Critical Care performed: No  Procedures   MEDICATIONS ORDERED IN ED:  Medications  ibuprofen  (ADVIL ) tablet 600 mg (has no administration in time range)  ondansetron  (ZOFRAN ) tablet 4 mg (has no administration in time range)  alum & mag hydroxide-simeth (MAALOX/MYLANTA) 200-200-20 MG/5ML suspension 30 mL (has no administration in time range)  ketorolac  (TORADOL ) 30 MG/ML injection 30 mg (30 mg Intramuscular Given 01/23/24 1406)     IMPRESSION / MDM / ASSESSMENT AND PLAN / ED COURSE   I have reviewed the triage note and vital signs. Vital signs are stable.   Differential diagnosis includes, but is not limited to, strep throat,   Patient's presentation is most consistent with acute presentation with potential threat to life or bodily function.  28 year old female presents to the ER for evaluation of sore throat x 2 weeks that is not getting better. No fever. She has not taken anything  for pain today.   On exam, her tonsils are enlarged but I do not see any exudate.  Uvula is midline.  She is swallowing and tolerating secretions well.  No indication of peritonsillar abscess.  She is also experiencing heavy menstrual cycle but she says comes and goes.  She will bleed for a day or 2 and then stop and then few days later started again.  This is nothing new and she has an upcoming hysterectomy.  Lab studies are reassuring.  No indication of anemia and platelet count is normal.  CMP and lipase were normal as well.  Urinalysis is not concerning for acute cystitis.  Strep test is negative.  Because she is also requesting  evaluation by psychiatry, additional labs were added such as ethanol which is negative, urine drug screen which is positive for cannabis, and TSH that is normal at 1.3.  Due to the length of time she has had the sore throat, plan will be to treat with antibiotics.  ----------------------------------------- 4:20 PM on 01/23/2024 ----------------------------------------- Patient made aware that the psych consult may take up to  24 hours especially since this is Sunday.  She was given the option to follow-up with [facility] tomorrow since she is not currently suicidal and does not have a plan to harm herself or others.  She would prefer to stay because she knows that if she leaves she will not likely follow through with going to [facility] tomorrow.  She really wants to get back on her medications especially since she is having hysterectomy and is concerned about the hormone changes that will be associated with that making her mental health worse.  She was moved to 25 hallway where she will wait.  Care transferred to Dr. Bradler.      FINAL CLINICAL IMPRESSION(S) / ED DIAGNOSES   Final diagnoses:  Pharyngitis, unspecified etiology  Acute cough  Menorrhagia with irregular cycle     Rx / DC Orders   ED Discharge Orders          Ordered    doxycycline (VIBRAMYCIN) 100 MG capsule  2 times daily        11 /09/25 1234             Note:  This document was prepared using Dragon voice recognition software and may include unintentional dictation errors.   Kristy Kirk NOVAK, FNP 01/23/24 1750    Kristy Pastor, MD 01/23/24 [id number]

## 2024-01-23 NOTE — ED Notes (Addendum)
 Pt notified this RN of desire to be discharged. MD notified and agrees to discharge. Pt given phone to see if she will be able to get a ride home per request.

## 2024-01-23 NOTE — ED Triage Notes (Signed)
 Pt to ED via POV from home. Pt reports sore throat x2 wks that has gotten worse. Pt has not been seen for this prior. Pt also reports heavy vaginal bleeding and lower abd pain x2.5wks

## 2024-01-23 NOTE — ED Notes (Signed)
 Pt states has ride available. Belongings bag with all belongings returned to pt and pt dressing out at this time.

## 2024-01-31 ENCOUNTER — Ambulatory Visit: Payer: MEDICAID | Admitting: Urology

## 2024-01-31 DIAGNOSIS — R32 Unspecified urinary incontinence: Secondary | ICD-10-CM

## 2024-02-02 ENCOUNTER — Encounter: Payer: MEDICAID | Admitting: Nurse Practitioner

## 2024-02-08 ENCOUNTER — Encounter
Admission: RE | Admit: 2024-02-08 | Discharge: 2024-02-08 | Disposition: A | Discharge status: Home / Self Care | Payer: MEDICAID | Source: Ambulatory Visit | Attending: Obstetrics and Gynecology | Admitting: Obstetrics and Gynecology

## 2024-02-08 ENCOUNTER — Other Ambulatory Visit: Payer: Self-pay

## 2024-02-08 VITALS — Ht 62.0 in | Wt 150.0 lb

## 2024-02-08 DIAGNOSIS — Z01818 Encounter for other preprocedural examination: Secondary | ICD-10-CM | POA: Insufficient documentation

## 2024-02-08 DIAGNOSIS — N924 Excessive bleeding in the premenopausal period: Secondary | ICD-10-CM

## 2024-02-08 HISTORY — DX: Excessive and frequent menstruation with regular cycle: N92.0

## 2024-02-08 NOTE — Patient Instructions (Addendum)
 Your procedure is scheduled on: FRIDAY 02/18/24 Report to the Registration Desk on the 1st floor of the [facility]. To find out your arrival time, please call [phone] between 1PM - 3PM on: THURSDAY 02/17/24 If your arrival time is 6:00 am, do not arrive before that time as the [facility] entrance doors do not open until 6:00 am.  REMEMBER: Instructions that are not followed completely may result in serious medical risk, up to and including death; or upon the discretion of your surgeon and anesthesiologist your surgery may need to be rescheduled.  Do not eat food after midnight the night before surgery.  No gum chewing or hard candies.  You may however, drink CLEAR liquids up to 2 hours before you are scheduled to arrive for your surgery. Do not drink anything within 2 hours of your scheduled arrival time.  Clear liquids include: - water  - apple juice without pulp - gatorade (not RED colors) - black coffee or tea (Do NOT add milk or creamers to the coffee or tea) Do NOT drink anything that is not on this list.  One week prior to surgery: Stop Anti-inflammatories (NSAIDS) such as cyclobenzaprine  (FLEXERIL ), Advil , Aleve , Ibuprofen , Motrin , Naproxen , Naprosyn  and Aspirin based products such as Excedrin, Goody's Powder, BC Powder.  Stop ANY OVER THE COUNTER supplements until after surgery.  You may however, continue to take Tylenol  if needed for pain up until the day of surgery.  Continue taking all of your other prescription medications up until the day of surgery.  ON THE DAY OF SURGERY ONLY TAKE THESE MEDICATIONS WITH SIPS OF WATER:  albuterol  (VENTOLIN  HFA)  fluticasone  (FLOVENT  HFA)  SYMBICORT   Use inhalers on the day of surgery and bring to the hospital.  No Alcohol for 24 hours before or after surgery.  No Smoking including e-cigarettes for 24 hours before surgery.  No chewable tobacco products for at least 6 hours before surgery.  No nicotine  patches on the day  of surgery.  Do not use any recreational drugs for at least a week (preferably 2 weeks) before your surgery.  Please be advised that the combination of cocaine and anesthesia may have negative outcomes, up to and including death. If you test positive for cocaine, your surgery will be cancelled.  On the morning of surgery brush your teeth with toothpaste and water, you may rinse your mouth with mouthwash if you wish. Do not swallow any toothpaste or mouthwash.   Use CHG Soap or wipes as directed on instruction sheet.  Do not wear jewelry, make-up, hairpins, clips or nail polish.  For welded (permanent) jewelry: bracelets, anklets, waist bands, etc.  Please have this removed prior to surgery.  If it is not removed, there is a chance that hospital personnel will need to cut it off on the day of surgery.  Do not wear lotions, powders, or perfumes.   Do not shave body hair from the neck down 48 hours before surgery.  Contact lenses, hearing aids and dentures may not be worn into surgery.  Do not bring valuables to the hospital. [facility] is not responsible for any missing/lost belongings or valuables.   Bring your C-PAP to the hospital in case you may have to spend the night.   Notify your doctor if there is any change in your medical condition (cold, fever, infection).  Wear comfortable clothing (specific to your surgery type) to the hospital.  After surgery, you can help prevent lung complications by doing breathing  exercises.  Take deep breaths and cough every 1-2 hours. Your doctor may order a device called an Incentive Spirometer to help you take deep breaths. When coughing or sneezing, hold a pillow firmly against your incision with both hands. This is called "splinting." Doing this helps protect your incision. It also decreases belly discomfort.  If you are being admitted to the hospital overnight, leave your suitcase in the car. After surgery it may be brought to your  room.  In case of increased patient census, it may be necessary for you, the patient, to continue your postoperative care in the Same Day Surgery department.  If you are being discharged the day of surgery, you will not be allowed to drive home. You will need a responsible individual to drive you home and stay with you for 24 hours after surgery.   If you are taking public transportation, you will need to have a responsible individual with you.  Please call the Pre-admissions Testing Dept. at [phone] if you have any questions about these instructions.  Surgery Visitation Policy:  Patients having surgery or a procedure may have two visitors.  Children under the age of 17 must have an adult with them who is not the patient.  Merchandiser, Retail to address health-related social needs:  [email or web address]                                                                                                             Preparing for Surgery with CHLORHEXIDINE  GLUCONATE (CHG) Soap  Chlorhexidine  Gluconate (CHG) Soap  o An antiseptic cleaner that kills germs and bonds with the skin to continue killing germs even after washing  o Used for showering the night before surgery and morning of surgery  Before surgery, you can play an important role by reducing the number of germs on your skin.  CHG (Chlorhexidine  gluconate) soap is an antiseptic cleanser which kills germs and bonds with the skin to continue killing germs even after washing.  Please do not use if you have an allergy to CHG or antibacterial soaps. If your skin becomes reddened/irritated stop using the CHG.  1. Shower the NIGHT BEFORE SURGERY with CHG soap.  2. If you choose to wash your hair, wash your hair first as usual with your normal shampoo.  3. After shampooing, rinse your hair and body thoroughly to remove the shampoo.  4. Use CHG as you would any other liquid soap. You can apply CHG directly to  the skin and wash gently with a clean washcloth.  5. Apply the CHG soap to your body only from the neck down. Do not use on open wounds or open sores. Avoid contact with your eyes, ears, mouth, and genitals (private parts). Wash face and genitals (private parts) with your normal soap.  6. Wash thoroughly, paying special attention to the area where your surgery will be performed.  7. Thoroughly rinse your body with warm water.  8. Do not shower/wash with your normal soap after using and rinsing off the CHG soap.  9. Do not use lotions, oils, etc., after showering with CHG.  10. Pat yourself dry with a clean towel.  11. Wear clean pajamas to bed the night before surgery.  12. Place clean sheets on your bed the night of your shower and do not sleep with pets.  13. Do not apply any deodorants/lotions/powders.  14. Please wear clean clothes to the hospital.  15. Remember to brush your teeth with your regular toothpaste.

## 2024-02-14 ENCOUNTER — Encounter
Admission: RE | Admit: 2024-02-14 | Discharge: 2024-02-14 | Disposition: A | Payer: MEDICAID | Source: Ambulatory Visit | Attending: Obstetrics and Gynecology | Admitting: Obstetrics and Gynecology

## 2024-02-14 ENCOUNTER — Encounter: Payer: Self-pay | Admitting: Urgent Care

## 2024-02-14 NOTE — Progress Notes (Signed)
  Perioperative Services Pre-Admission/Anesthesia Testing    Date: 02/14/24  Name: Kristy Reid DOB: 19-Feb-1996 MRN:   [id number]  Re: No show for labs       Planned Surgical Procedure(s):     Date/Time: 02/14/24 1500   Procedure: LAB   Location: [facility] PRE ADMISSION TESTING        Patient is scheduled for the above appointment today. We attempted several times to call the numbers that we have available, however there was no answer and we were unable to leave a message.   Needs repeat CBC and T&S as ordered by attending OB/GYN. Will reattempt tomorrow.   Dorise Pereyra, MSN, APRN, FNP-C, CEN [facility]  Perioperative Services Nurse Practitioner Phone: [phone] Fax: [phone] 02/14/24 3:45 PM  NOTE: This note has been prepared using Dragon dictation software. Despite my best ability to proofread, there is always the potential that unintentional transcriptional errors may still occur from this process.

## 2024-02-15 ENCOUNTER — Encounter: Payer: Self-pay | Admitting: Urgent Care

## 2024-02-16 ENCOUNTER — Inpatient Hospital Stay: Admission: RE | Admit: 2024-02-16 | Payer: MEDICAID | Source: Ambulatory Visit

## 2024-02-18 ENCOUNTER — Ambulatory Visit: Admission: RE | Admit: 2024-02-18 | Payer: MEDICAID | Source: Ambulatory Visit | Admitting: Obstetrics and Gynecology

## 2024-02-18 ENCOUNTER — Encounter: Admission: RE | Payer: Self-pay | Source: Ambulatory Visit

## 2024-02-18 ENCOUNTER — Encounter: Payer: Self-pay | Admitting: Urgent Care

## 2024-02-18 DIAGNOSIS — N921 Excessive and frequent menstruation with irregular cycle: Secondary | ICD-10-CM

## 2024-02-18 SURGERY — HYSTERECTOMY, TOTAL, LAPAROSCOPIC, ROBOT-ASSISTED WITH SALPINGECTOMY
Anesthesia: Choice | Site: Uterus

## 2024-03-20 ENCOUNTER — Ambulatory Visit: Payer: MEDICAID | Admitting: Urology

## 2024-04-24 ENCOUNTER — Ambulatory Visit: Payer: MEDICAID | Admitting: Urology
